# Patient Record
Sex: Male | Born: 1945 | Race: White | Hispanic: No | Smoking: Former smoker
Health system: Southern US, Community
[De-identification: ages and names within clinical notes are randomized; demographics above are authoritative.]

## PROBLEM LIST (undated history)

## (undated) DIAGNOSIS — I509 Heart failure, unspecified: Secondary | ICD-10-CM

## (undated) DIAGNOSIS — Z7901 Long term (current) use of anticoagulants: Secondary | ICD-10-CM

## (undated) DIAGNOSIS — G629 Polyneuropathy, unspecified: Secondary | ICD-10-CM

## (undated) DIAGNOSIS — I10 Essential (primary) hypertension: Secondary | ICD-10-CM

## (undated) DIAGNOSIS — G4730 Sleep apnea, unspecified: Secondary | ICD-10-CM

## (undated) DIAGNOSIS — I714 Abdominal aortic aneurysm, without rupture, unspecified: Secondary | ICD-10-CM

## (undated) DIAGNOSIS — M48061 Spinal stenosis, lumbar region without neurogenic claudication: Secondary | ICD-10-CM

## (undated) DIAGNOSIS — I499 Cardiac arrhythmia, unspecified: Secondary | ICD-10-CM

## (undated) DIAGNOSIS — E119 Type 2 diabetes mellitus without complications: Secondary | ICD-10-CM

## (undated) DIAGNOSIS — F419 Anxiety disorder, unspecified: Secondary | ICD-10-CM

## (undated) DIAGNOSIS — I252 Old myocardial infarction: Secondary | ICD-10-CM

## (undated) DIAGNOSIS — M1990 Unspecified osteoarthritis, unspecified site: Secondary | ICD-10-CM

## (undated) DIAGNOSIS — I2510 Atherosclerotic heart disease of native coronary artery without angina pectoris: Secondary | ICD-10-CM

## (undated) DIAGNOSIS — F32A Depression, unspecified: Secondary | ICD-10-CM

## (undated) DIAGNOSIS — I255 Ischemic cardiomyopathy: Secondary | ICD-10-CM

## (undated) DIAGNOSIS — I4891 Unspecified atrial fibrillation: Secondary | ICD-10-CM

## (undated) DIAGNOSIS — I700 Atherosclerosis of aorta: Secondary | ICD-10-CM

## (undated) DIAGNOSIS — G459 Transient cerebral ischemic attack, unspecified: Secondary | ICD-10-CM

## (undated) HISTORY — DX: Heart failure, unspecified: I50.9

## (undated) HISTORY — PX: CATARACT EXTRACTION, BILATERAL: SHX1313

## (undated) HISTORY — PX: APPENDECTOMY: SHX54

## (undated) HISTORY — DX: Cardiac arrhythmia, unspecified: I49.9

## (undated) HISTORY — PX: COLONOSCOPY: SHX174

## (undated) HISTORY — PX: BREAST SURGERY: SHX581

## (undated) HISTORY — PX: REPLACEMENT TOTAL KNEE BILATERAL: SUR1225

## (undated) HISTORY — PX: CORONARY ANGIOPLASTY WITH STENT PLACEMENT: SHX49

---

## 2005-06-24 ENCOUNTER — Emergency Department: Payer: Self-pay | Admitting: Emergency Medicine

## 2008-06-13 ENCOUNTER — Ambulatory Visit: Payer: Self-pay | Admitting: Internal Medicine

## 2009-06-18 ENCOUNTER — Emergency Department: Payer: Self-pay | Admitting: Emergency Medicine

## 2011-01-12 ENCOUNTER — Emergency Department: Payer: Self-pay | Admitting: Emergency Medicine

## 2011-08-15 DIAGNOSIS — E669 Obesity, unspecified: Secondary | ICD-10-CM | POA: Insufficient documentation

## 2011-09-19 ENCOUNTER — Ambulatory Visit: Payer: Self-pay | Admitting: Family Medicine

## 2012-01-29 DIAGNOSIS — M179 Osteoarthritis of knee, unspecified: Secondary | ICD-10-CM | POA: Insufficient documentation

## 2012-01-29 DIAGNOSIS — M1710 Unilateral primary osteoarthritis, unspecified knee: Secondary | ICD-10-CM | POA: Insufficient documentation

## 2012-10-01 ENCOUNTER — Ambulatory Visit: Payer: Self-pay | Admitting: Family Medicine

## 2012-10-01 DIAGNOSIS — I714 Abdominal aortic aneurysm, without rupture: Secondary | ICD-10-CM | POA: Insufficient documentation

## 2013-10-20 ENCOUNTER — Ambulatory Visit: Payer: Self-pay | Admitting: Family Medicine

## 2013-11-09 ENCOUNTER — Ambulatory Visit: Payer: Self-pay | Admitting: Family Medicine

## 2013-11-09 LAB — CREATININE, SERUM
CREATININE: 1.07 mg/dL (ref 0.60–1.30)
EGFR (African American): >60

## 2013-11-11 DIAGNOSIS — N281 Cyst of kidney, acquired: Secondary | ICD-10-CM | POA: Insufficient documentation

## 2014-12-19 DIAGNOSIS — E119 Type 2 diabetes mellitus without complications: Secondary | ICD-10-CM

## 2016-05-17 DIAGNOSIS — G608 Other hereditary and idiopathic neuropathies: Secondary | ICD-10-CM | POA: Insufficient documentation

## 2016-07-05 DIAGNOSIS — F321 Major depressive disorder, single episode, moderate: Secondary | ICD-10-CM | POA: Insufficient documentation

## 2017-05-21 ENCOUNTER — Other Ambulatory Visit: Payer: Self-pay | Admitting: Family Medicine

## 2017-05-21 DIAGNOSIS — I714 Abdominal aortic aneurysm, without rupture, unspecified: Secondary | ICD-10-CM

## 2017-05-21 DIAGNOSIS — E119 Type 2 diabetes mellitus without complications: Secondary | ICD-10-CM

## 2017-05-28 ENCOUNTER — Encounter (INDEPENDENT_AMBULATORY_CARE_PROVIDER_SITE_OTHER): Payer: Self-pay

## 2017-05-28 ENCOUNTER — Ambulatory Visit
Admission: RE | Admit: 2017-05-28 | Discharge: 2017-05-28 | Disposition: A | Discharge status: Home / Self Care | Payer: Medicare HMO | Source: Ambulatory Visit | Attending: Family Medicine | Admitting: Family Medicine

## 2017-05-28 DIAGNOSIS — I714 Abdominal aortic aneurysm, without rupture, unspecified: Secondary | ICD-10-CM

## 2017-05-28 DIAGNOSIS — E119 Type 2 diabetes mellitus without complications: Secondary | ICD-10-CM

## 2017-10-24 ENCOUNTER — Other Ambulatory Visit: Payer: Self-pay | Admitting: Medical Oncology

## 2017-10-24 DIAGNOSIS — R030 Elevated blood-pressure reading, without diagnosis of hypertension: Secondary | ICD-10-CM

## 2017-10-29 ENCOUNTER — Ambulatory Visit
Admission: RE | Admit: 2017-10-29 | Discharge: 2017-10-29 | Disposition: A | Discharge status: Home / Self Care | Payer: Medicare HMO | Source: Ambulatory Visit | Attending: Medical Oncology | Admitting: Medical Oncology

## 2017-10-29 DIAGNOSIS — N281 Cyst of kidney, acquired: Secondary | ICD-10-CM | POA: Diagnosis not present

## 2017-10-29 DIAGNOSIS — R030 Elevated blood-pressure reading, without diagnosis of hypertension: Secondary | ICD-10-CM | POA: Diagnosis not present

## 2018-01-20 DIAGNOSIS — G4733 Obstructive sleep apnea (adult) (pediatric): Secondary | ICD-10-CM | POA: Insufficient documentation

## 2018-01-23 DIAGNOSIS — B070 Plantar wart: Secondary | ICD-10-CM | POA: Insufficient documentation

## 2018-08-09 ENCOUNTER — Emergency Department: Payer: Medicare HMO

## 2018-08-09 ENCOUNTER — Emergency Department
Admission: EM | Admit: 2018-08-09 | Discharge: 2018-08-09 | Disposition: A | Discharge status: Home / Self Care | Payer: Medicare HMO | Attending: Emergency Medicine | Admitting: Emergency Medicine

## 2018-08-09 ENCOUNTER — Encounter: Payer: Self-pay | Admitting: Emergency Medicine

## 2018-08-09 ENCOUNTER — Other Ambulatory Visit: Payer: Self-pay

## 2018-08-09 DIAGNOSIS — I10 Essential (primary) hypertension: Secondary | ICD-10-CM | POA: Insufficient documentation

## 2018-08-09 DIAGNOSIS — R6884 Jaw pain: Secondary | ICD-10-CM | POA: Diagnosis not present

## 2018-08-09 DIAGNOSIS — I2510 Atherosclerotic heart disease of native coronary artery without angina pectoris: Secondary | ICD-10-CM | POA: Diagnosis not present

## 2018-08-09 DIAGNOSIS — E119 Type 2 diabetes mellitus without complications: Secondary | ICD-10-CM | POA: Insufficient documentation

## 2018-08-09 DIAGNOSIS — I252 Old myocardial infarction: Secondary | ICD-10-CM | POA: Insufficient documentation

## 2018-08-09 DIAGNOSIS — Z955 Presence of coronary angioplasty implant and graft: Secondary | ICD-10-CM | POA: Diagnosis not present

## 2018-08-09 HISTORY — DX: Old myocardial infarction: I25.2

## 2018-08-09 HISTORY — DX: Type 2 diabetes mellitus without complications: E11.9

## 2018-08-09 HISTORY — DX: Essential (primary) hypertension: I10

## 2018-08-09 LAB — BASIC METABOLIC PANEL
Anion gap: 11 (ref 5–15)
BUN: 13 mg/dL (ref 8–23)
CO2: 26 mmol/L (ref 22–32)
Calcium: 9.1 mg/dL (ref 8.9–10.3)
Chloride: 97 mmol/L — ABNORMAL LOW (ref 98–111)
Creatinine, Ser: 0.76 mg/dL (ref 0.61–1.24)
GFR calc Af Amer: >60 mL/min (ref >60)
GFR calc non Af Amer: >60 mL/min (ref >60)
Glucose, Bld: 122 mg/dL — ABNORMAL HIGH (ref 70–99)
Potassium: 4 mmol/L (ref 3.5–5.1)
Sodium: 134 mmol/L — ABNORMAL LOW (ref 135–145)

## 2018-08-09 LAB — CBC
HCT: 47.7 % (ref 39.0–52.0)
Hemoglobin: 15.9 g/dL (ref 13.0–17.0)
MCH: 31.5 pg (ref 26.0–34.0)
MCHC: 33.3 g/dL (ref 30.0–36.0)
MCV: 94.6 fL (ref 80.0–100.0)
Platelets: 244 10*3/uL (ref 150–400)
RBC: 5.04 MIL/uL (ref 4.22–5.81)
RDW: 12.9 % (ref 11.5–15.5)
WBC: 8.6 10*3/uL (ref 4.0–10.5)
nRBC: 0 % (ref 0.0–0.2)

## 2018-08-09 LAB — TROPONIN I: Troponin I: <0.03 ng/mL (ref <0.03)

## 2018-08-09 MED ORDER — IOHEXOL 350 MG/ML SOLN
75.0000 mL | Freq: Once | INTRAVENOUS | Status: AC | PRN
  Administered 2018-08-09: 10:00:00 75 mL via INTRAVENOUS

## 2018-08-09 MED ORDER — SODIUM CHLORIDE 0.9% FLUSH
3.0000 mL | Freq: Once | INTRAVENOUS | Status: AC
  Administered 2018-08-09: 3 mL via INTRAVENOUS

## 2018-08-09 NOTE — Discharge Instructions (Addendum)
Return to the ER for new, worsening, or persistent jaw or neck pain, chest pain, difficulty breathing, weakness or lightheadedness, or any numbness, problems with speech or vision, or strokelike symptoms.  Make an appointment to follow-up with your regular doctor within the next few weeks.

## 2018-08-09 NOTE — ED Provider Notes (Signed)
[facility] Emergency Department Provider Note ____________________________________________   First MD Initiated Contact with Patient 08/09/18 [phone]     (approximate)  I have reviewed the triage vital signs and the nursing notes.   HISTORY  Chief Complaint Jaw Pain    HPI Devin Gowdadward Frank Miotke Jr. is a 73 y.o. male with PMH as noted below including CAD status post stent placement who presents with right-sided neck pain over approximately the last 2 weeks, relatively mild in intensity and intermittent in time course.  He states that today it started radiating to his right jaw so he became concerned about a "blockage.".  He denies chest pain, difficulty breathing, or any weakness or lightheadedness.  He has no headache, and no weakness or numbness.  He has no throbbing sensation or any swelling to the neck.  Past Medical History:  Diagnosis Date  . Diabetes mellitus without complication (HCC)   . Hypertension   . MI, old     There are no active problems to display for this patient.   Past Surgical History:  Procedure Laterality Date  . CORONARY ANGIOPLASTY WITH STENT PLACEMENT      Prior to Admission medications   Not on File    Allergies Patient has no known allergies.  No family history on file.  Social History Social History   Tobacco Use  . Smoking status: Not on file  Substance Use Topics  . Alcohol use: Not on file  . Drug use: Not on file    Review of Systems  Constitutional: No fever. Eyes: No visual changes. ENT: Positive for neck pain. Cardiovascular: Denies chest pain. Respiratory: Denies shortness of breath. Gastrointestinal: No nausea or vomiting. Genitourinary: Negative for flank pain.  Musculoskeletal: Negative for back pain. Skin: Negative for rash. Neurological: Negative for headaches, focal weakness or numbness.   ____________________________________________   PHYSICAL EXAM:  VITAL SIGNS: ED Triage Vitals   Enc Vitals Group     BP 08/09/18 0901 (!) 162/85     Pulse Rate 08/09/18 0901 81     Resp 08/09/18 0901 20     Temp 08/09/18 0901 97.9 F (36.6 C)     Temp Source 08/09/18 0901 Oral     SpO2 08/09/18 0901 100 %     Weight 08/09/18 0858 220 lb (99.8 kg)     Height 08/09/18 0858 6' (1.829 m)     Head Circumference --      Peak Flow --      Pain Score 08/09/18 0858 3     Pain Loc --      Pain Edu? --      Excl. in GC? --     Constitutional: Alert and oriented. Well appearing and in no acute distress. Eyes: Conjunctivae are normal.  Head: Atraumatic. Nose: No congestion/rhinnorhea. Mouth/Throat: Mucous membranes are moist.   Neck: Normal range of motion.  Nontender.  No palpable masses, or any bruit or thrill. Cardiovascular: Normal rate, regular rhythm. Grossly normal heart sounds.  Good peripheral circulation. Respiratory: Normal respiratory effort.  No retractions. Lungs CTAB. Gastrointestinal: No distention.  Musculoskeletal: Extremities warm and well perfused.  Neurologic:  Normal speech and language.  Motor intact in all extremities.  No gross focal neurologic deficits are appreciated.  Skin:  Skin is warm and dry. No rash noted. Psychiatric: Mood and affect are normal. Speech and behavior are normal.  ____________________________________________   LABS (all labs ordered are listed, but only abnormal results are displayed)  Labs Reviewed  BASIC METABOLIC PANEL - Abnormal; Notable for the following components:      Result Value   Sodium 134 (*)    Chloride 97 (*)    Glucose, Bld 122 (*)    All other components within normal limits  CBC  TROPONIN I   ____________________________________________  EKG  ED ECG REPORT I, Dionne Bucy, the attending physician, personally viewed and interpreted this ECG.  Date: 08/09/2018 EKG Time: 0857 Rate: 82 Rhythm: normal sinus rhythm QRS Axis: normal Intervals: normal ST/T Wave abnormalities: normal Narrative  Interpretation: no evidence of acute ischemia  ____________________________________________  RADIOLOGY  CXR: No acute abnormality CT angio neck: Atherosclerotic findings consistent with age, with no acute abnormalities  ____________________________________________   PROCEDURES  Procedure(s) performed: No  Procedures  Critical Care performed: No ____________________________________________   INITIAL IMPRESSION / ASSESSMENT AND PLAN / ED COURSE  Pertinent labs & imaging results that were available during my care of the patient were reviewed by me and considered in my medical decision making (see chart for details).  73 year old male with a history of CAD presents with right-sided neck pain over the last 2 weeks, now radiating to his jaw.  He has no chest pain, or any neurologic symptoms.  There is no history of trauma.  On exam the patient is relatively well-appearing.  His vital signs are normal except for hypertension.  The neck is nontender, and there is no palpable mass or any bruit or thrill.  The remainder of the exam is unremarkable.  EKG is nonischemic.  Overall I suspect most likely benign etiology such as muscular pain or possible neuralgia.  The patient has had shingles in the past although there is no rash present there currently.  However, given his CAD history and elevated risk of ACS, we will obtain labs including troponin to rule out ACS.  He has no strokelike symptoms to suggest acute arterial occlusion, but given his CAD history I will also obtain a CT angio of the neck to rule out vascular etiology.  ----------------------------------------- 12:46 PM on 08/09/2018 -----------------------------------------  Lab work-up including troponin are negative.  The patient continues to remain comfortable.  Chest x-ray and CT angiogram of the neck show no acute abnormality.  The CT just shows atherosclerotic changes consistent with his age and chronic appearing.  At this  time, the patient is stable for discharge home.  I offered to keep him for a second troponin given his CAD history and elevated risk for ACS, but he declines.  He understands that without doing this we cannot fully rule out a heart attack although given the fact that his symptoms have been present for several days, the suspicion for ACS and the likelihood of a positive second troponin is very low.  I counseled him on the results of the work-up.  He agrees to follow-up with his PMD.  Return precautions given, and he expresses understanding. ____________________________________________   FINAL CLINICAL IMPRESSION(S) / ED DIAGNOSES  Final diagnoses:  Jaw pain      NEW MEDICATIONS STARTED DURING THIS VISIT:  New Prescriptions   No medications on file     Note:  This document was prepared using Dragon voice recognition software and may include unintentional dictation errors.    Dionne Bucy, MD 08/09/18 1247

## 2018-08-09 NOTE — ED Triage Notes (Signed)
R jaw and neck pain x 1 week. No injury.

## 2018-08-09 NOTE — ED Notes (Signed)
Pt resting quietly with eyes closed at this time, respirations equal and unlabored.

## 2018-08-09 NOTE — ED Notes (Signed)
Patient transported to X-ray 

## 2019-03-04 ENCOUNTER — Other Ambulatory Visit: Payer: Self-pay | Admitting: Family Medicine

## 2019-03-04 ENCOUNTER — Ambulatory Visit
Admission: RE | Admit: 2019-03-04 | Discharge: 2019-03-04 | Disposition: A | Discharge status: Home / Self Care | Payer: Medicare HMO | Source: Ambulatory Visit | Attending: Family Medicine | Admitting: Family Medicine

## 2019-03-04 ENCOUNTER — Other Ambulatory Visit: Payer: Self-pay

## 2019-03-04 DIAGNOSIS — M7989 Other specified soft tissue disorders: Secondary | ICD-10-CM | POA: Insufficient documentation

## 2019-03-28 ENCOUNTER — Ambulatory Visit: Payer: Medicare Other | Attending: Internal Medicine

## 2019-03-28 DIAGNOSIS — Z23 Encounter for immunization: Secondary | ICD-10-CM | POA: Insufficient documentation

## 2019-03-28 NOTE — Progress Notes (Signed)
   [id number]  Name:  Devin [name] [facility].    MRN: [id number] DOB: [birth year]  03/28/2019  Devin [name] was observed post Covid-19 immunization for 15 minutes without incidence. He was provided with Vaccine Information Sheet and instruction to access the V-Safe system.   Devin [name] was instructed to call 911 with any severe reactions post vaccine: Marland Kitchen Difficulty breathing  . Swelling of your face and throat  . A fast heartbeat  . A bad rash all over your body  . Dizziness and weakness    Immunizations Administered    Name Date Dose VIS Date Route   Pfizer COVID-19 Vaccine 03/28/2019 12:44 PM 0.3 mL 02/20/2019 Intramuscular   Manufacturer: [employer], [employer]   Lot: [id number]   NDC: [address]-4

## 2019-04-18 ENCOUNTER — Ambulatory Visit: Payer: Medicare HMO | Attending: Internal Medicine

## 2019-04-18 DIAGNOSIS — Z23 Encounter for immunization: Secondary | ICD-10-CM | POA: Insufficient documentation

## 2019-04-18 NOTE — Progress Notes (Signed)
   [id number]  Name:  Andres Bantz [facility].    MRN: [id number] DOB: 07/19/1945  04/18/2019  Mr. Ehlert was observed post Covid-19 immunization for 15 minutes without incidence. He was provided with Vaccine Information Sheet and instruction to access the V-Safe system.   Mr. Mclinden was instructed to call 911 with any severe reactions post vaccine: Marland Kitchen Difficulty breathing  . Swelling of your face and throat  . A fast heartbeat  . A bad rash all over your body  . Dizziness and weakness    Immunizations Administered    Name Date Dose VIS Date Route   Pfizer COVID-19 Vaccine 04/18/2019  1:27 PM 0.3 mL 02/20/2019 Intramuscular   Manufacturer: [employer], [employer]   Lot: [id number]   NDC: [address]-2

## 2019-05-11 DIAGNOSIS — Q2112 Patent foramen ovale: Secondary | ICD-10-CM

## 2019-05-11 DIAGNOSIS — Q211 Atrial septal defect: Secondary | ICD-10-CM

## 2019-05-11 HISTORY — PX: CORONARY ARTERY BYPASS GRAFT: SHX141

## 2019-05-11 HISTORY — DX: Atrial septal defect: Q21.1

## 2019-05-11 HISTORY — DX: Patent foramen ovale: Q21.12

## 2019-05-16 ENCOUNTER — Other Ambulatory Visit: Payer: Self-pay

## 2019-05-16 ENCOUNTER — Emergency Department: Payer: Medicare HMO

## 2019-05-16 ENCOUNTER — Observation Stay
Admission: EM | Admit: 2019-05-16 | Discharge: 2019-05-16 | Disposition: A | Discharge status: Home / Self Care | Payer: Medicare HMO | Attending: Internal Medicine | Admitting: Internal Medicine

## 2019-05-16 DIAGNOSIS — Z7984 Long term (current) use of oral hypoglycemic drugs: Secondary | ICD-10-CM | POA: Insufficient documentation

## 2019-05-16 DIAGNOSIS — I10 Essential (primary) hypertension: Secondary | ICD-10-CM | POA: Diagnosis not present

## 2019-05-16 DIAGNOSIS — K219 Gastro-esophageal reflux disease without esophagitis: Secondary | ICD-10-CM | POA: Diagnosis not present

## 2019-05-16 DIAGNOSIS — Z79899 Other long term (current) drug therapy: Secondary | ICD-10-CM | POA: Insufficient documentation

## 2019-05-16 DIAGNOSIS — I25110 Atherosclerotic heart disease of native coronary artery with unstable angina pectoris: Principal | ICD-10-CM | POA: Insufficient documentation

## 2019-05-16 DIAGNOSIS — Z955 Presence of coronary angioplasty implant and graft: Secondary | ICD-10-CM | POA: Insufficient documentation

## 2019-05-16 DIAGNOSIS — I252 Old myocardial infarction: Secondary | ICD-10-CM | POA: Insufficient documentation

## 2019-05-16 DIAGNOSIS — I209 Angina pectoris, unspecified: Secondary | ICD-10-CM | POA: Diagnosis not present

## 2019-05-16 DIAGNOSIS — Z20822 Contact with and (suspected) exposure to covid-19: Secondary | ICD-10-CM | POA: Insufficient documentation

## 2019-05-16 DIAGNOSIS — Z87891 Personal history of nicotine dependence: Secondary | ICD-10-CM | POA: Insufficient documentation

## 2019-05-16 DIAGNOSIS — E119 Type 2 diabetes mellitus without complications: Secondary | ICD-10-CM | POA: Diagnosis not present

## 2019-05-16 DIAGNOSIS — I25111 Atherosclerotic heart disease of native coronary artery with angina pectoris with documented spasm: Secondary | ICD-10-CM

## 2019-05-16 DIAGNOSIS — F32A Depression, unspecified: Secondary | ICD-10-CM

## 2019-05-16 DIAGNOSIS — I2510 Atherosclerotic heart disease of native coronary artery without angina pectoris: Secondary | ICD-10-CM | POA: Diagnosis present

## 2019-05-16 DIAGNOSIS — F329 Major depressive disorder, single episode, unspecified: Secondary | ICD-10-CM | POA: Diagnosis not present

## 2019-05-16 DIAGNOSIS — I200 Unstable angina: Secondary | ICD-10-CM

## 2019-05-16 DIAGNOSIS — R079 Chest pain, unspecified: Secondary | ICD-10-CM | POA: Diagnosis present

## 2019-05-16 LAB — TROPONIN I (HIGH SENSITIVITY)
Troponin I (High Sensitivity): 8 ng/L (ref <18)
Troponin I (High Sensitivity): 18 ng/L — ABNORMAL HIGH (ref <18)
Troponin I (High Sensitivity): 23 ng/L — ABNORMAL HIGH (ref <18)

## 2019-05-16 LAB — BASIC METABOLIC PANEL
Anion gap: 12 (ref 5–15)
Anion gap: 15 (ref 5–15)
BUN: 14 mg/dL (ref 8–23)
BUN: 13 mg/dL (ref 8–23)
CO2: 25 mmol/L (ref 22–32)
CO2: 24 mmol/L (ref 22–32)
Calcium: 9 mg/dL (ref 8.9–10.3)
Calcium: 9.3 mg/dL (ref 8.9–10.3)
Chloride: 98 mmol/L (ref 98–111)
Chloride: 93 mmol/L — ABNORMAL LOW (ref 98–111)
Creatinine, Ser: 0.7 mg/dL (ref 0.61–1.24)
Creatinine, Ser: 0.7 mg/dL (ref 0.61–1.24)
GFR calc Af Amer: >60 mL/min (ref >60)
GFR calc Af Amer: >60 mL/min (ref >60)
GFR calc non Af Amer: >60 mL/min (ref >60)
GFR calc non Af Amer: >60 mL/min (ref >60)
Glucose, Bld: 145 mg/dL — ABNORMAL HIGH (ref 70–99)
Glucose, Bld: 149 mg/dL — ABNORMAL HIGH (ref 70–99)
Potassium: 3.7 mmol/L (ref 3.5–5.1)
Potassium: 4.1 mmol/L (ref 3.5–5.1)
Sodium: 135 mmol/L (ref 135–145)
Sodium: 132 mmol/L — ABNORMAL LOW (ref 135–145)

## 2019-05-16 LAB — CBC
HCT: 46.7 % (ref 39.0–52.0)
Hemoglobin: 15.8 g/dL (ref 13.0–17.0)
MCH: 31.5 pg (ref 26.0–34.0)
MCHC: 33.8 g/dL (ref 30.0–36.0)
MCV: 93.2 fL (ref 80.0–100.0)
Platelets: 273 10*3/uL (ref 150–400)
RBC: 5.01 MIL/uL (ref 4.22–5.81)
RDW: 12.9 % (ref 11.5–15.5)
WBC: 9.5 10*3/uL (ref 4.0–10.5)
nRBC: 0 % (ref 0.0–0.2)

## 2019-05-16 LAB — RESPIRATORY PANEL BY RT PCR (FLU A&B, COVID)
Influenza A by PCR: NEGATIVE
Influenza B by PCR: NEGATIVE
SARS Coronavirus 2 by RT PCR: NEGATIVE

## 2019-05-16 LAB — HEMOGLOBIN A1C
Hgb A1c MFr Bld: 5.8 % — ABNORMAL HIGH (ref 4.8–5.6)
Mean Plasma Glucose: 119.76 mg/dL

## 2019-05-16 LAB — GLUCOSE, CAPILLARY
Glucose-Capillary: 151 mg/dL — ABNORMAL HIGH (ref 70–99)
Glucose-Capillary: 138 mg/dL — ABNORMAL HIGH (ref 70–99)

## 2019-05-16 MED ORDER — MORPHINE SULFATE (PF) 2 MG/ML IV SOLN
2.0000 mg | Freq: Once | INTRAVENOUS | Status: AC
  Administered 2019-05-16: 2 mg via INTRAVENOUS
  Filled 2019-05-16: qty 1

## 2019-05-16 MED ORDER — ENOXAPARIN SODIUM 40 MG/0.4ML ~~LOC~~ SOLN: 40.0000 mg | SUBCUTANEOUS | Status: DC

## 2019-05-16 MED ORDER — ASPIRIN 81 MG PO CHEW: 81.0000 mg | CHEWABLE_TABLET | Freq: Every day | ORAL | Status: DC

## 2019-05-16 MED ORDER — ATORVASTATIN CALCIUM 20 MG PO TABS: 40.0000 mg | ORAL_TABLET | Freq: Every day | ORAL | Status: DC

## 2019-05-16 MED ORDER — ISOSORBIDE MONONITRATE ER 30 MG PO TB24: 30.0000 mg | ORAL_TABLET | Freq: Every day | ORAL | 1 refills | Status: DC

## 2019-05-16 MED ORDER — EZETIMIBE 10 MG PO TABS
10.0000 mg | ORAL_TABLET | Freq: Every day | ORAL | Status: DC
  Administered 2019-05-16: 10 mg via ORAL
  Filled 2019-05-16: qty 1

## 2019-05-16 MED ORDER — INSULIN ASPART 100 UNIT/ML ~~LOC~~ SOLN: 4.0000 [IU] | Freq: Three times a day (TID) | SUBCUTANEOUS | Status: DC

## 2019-05-16 MED ORDER — ONDANSETRON HCL 4 MG/2ML IJ SOLN: 4.0000 mg | Freq: Four times a day (QID) | INTRAMUSCULAR | Status: DC | PRN

## 2019-05-16 MED ORDER — ASPIRIN EC 81 MG PO TBEC
81.0000 mg | DELAYED_RELEASE_TABLET | Freq: Once | ORAL | Status: AC
  Administered 2019-05-16: 81 mg via ORAL
  Filled 2019-05-16: qty 1

## 2019-05-16 MED ORDER — METFORMIN HCL 500 MG PO TABS: 500.0000 mg | ORAL_TABLET | Freq: Two times a day (BID) | ORAL | Status: DC

## 2019-05-16 MED ORDER — ASCORBIC ACID 500 MG PO TABS
500.0000 mg | ORAL_TABLET | Freq: Every day | ORAL | Status: DC
  Administered 2019-05-16: 500 mg via ORAL
  Filled 2019-05-16: qty 1

## 2019-05-16 MED ORDER — ADULT MULTIVITAMIN W/MINERALS CH
1.0000 | ORAL_TABLET | Freq: Every day | ORAL | Status: DC
  Administered 2019-05-16: 1 via ORAL
  Filled 2019-05-16: qty 1

## 2019-05-16 MED ORDER — ONDANSETRON HCL 4 MG/2ML IJ SOLN
4.0000 mg | Freq: Once | INTRAMUSCULAR | Status: AC
  Administered 2019-05-16: 4 mg via INTRAVENOUS
  Filled 2019-05-16: qty 2

## 2019-05-16 MED ORDER — PAROXETINE HCL 30 MG PO TABS
30.0000 mg | ORAL_TABLET | Freq: Every day | ORAL | Status: DC
  Administered 2019-05-16: 30 mg via ORAL
  Filled 2019-05-16: qty 1

## 2019-05-16 MED ORDER — CLOPIDOGREL BISULFATE 75 MG PO TABS
300.0000 mg | ORAL_TABLET | Freq: Every day | ORAL | Status: DC
  Administered 2019-05-16: 300 mg via ORAL
  Filled 2019-05-16: qty 4

## 2019-05-16 MED ORDER — NITROGLYCERIN 2 % TD OINT
0.5000 [in_us] | TOPICAL_OINTMENT | Freq: Once | TRANSDERMAL | Status: AC
  Administered 2019-05-16: 0.5 [in_us] via TOPICAL

## 2019-05-16 MED ORDER — GABAPENTIN 300 MG PO CAPS
300.0000 mg | ORAL_CAPSULE | Freq: Two times a day (BID) | ORAL | Status: DC
  Administered 2019-05-16: 300 mg via ORAL
  Filled 2019-05-16: qty 1

## 2019-05-16 MED ORDER — ATORVASTATIN CALCIUM 80 MG PO TABS: 80.0000 mg | ORAL_TABLET | Freq: Every day | ORAL | 11 refills | Status: DC

## 2019-05-16 MED ORDER — ISOSORBIDE MONONITRATE ER 60 MG PO TB24
30.0000 mg | ORAL_TABLET | Freq: Every day | ORAL | Status: DC
  Administered 2019-05-16: 30 mg via ORAL
  Filled 2019-05-16: qty 1

## 2019-05-16 MED ORDER — ACETAMINOPHEN 500 MG PO TABS: 1000.0000 mg | ORAL_TABLET | Freq: Every day | ORAL | Status: DC

## 2019-05-16 MED ORDER — CLOPIDOGREL BISULFATE 75 MG PO TABS: 75.0000 mg | ORAL_TABLET | Freq: Every day | ORAL | 11 refills | Status: DC

## 2019-05-16 MED ORDER — PANTOPRAZOLE SODIUM 40 MG PO TBEC
40.0000 mg | DELAYED_RELEASE_TABLET | Freq: Every day | ORAL | Status: DC
  Administered 2019-05-16: 40 mg via ORAL
  Filled 2019-05-16: qty 1

## 2019-05-16 MED ORDER — CARVEDILOL 6.25 MG PO TABS
12.5000 mg | ORAL_TABLET | Freq: Two times a day (BID) | ORAL | Status: DC
  Administered 2019-05-16: 12.5 mg via ORAL
  Filled 2019-05-16: qty 2

## 2019-05-16 MED ORDER — ACETAMINOPHEN 325 MG PO TABS: 650.0000 mg | ORAL_TABLET | ORAL | Status: DC | PRN

## 2019-05-16 MED ORDER — AMLODIPINE BESYLATE 5 MG PO TABS
10.0000 mg | ORAL_TABLET | Freq: Every day | ORAL | Status: DC
  Administered 2019-05-16: 10 mg via ORAL
  Filled 2019-05-16: qty 2

## 2019-05-16 MED ORDER — CARVEDILOL 25 MG PO TABS: 25.0000 mg | ORAL_TABLET | Freq: Two times a day (BID) | ORAL | 11 refills | Status: DC

## 2019-05-16 MED ORDER — CLOPIDOGREL 5 MG/ML PEDIATRIC ORAL SUSPENSION: 300.0000 mg | Freq: Every day | ORAL | Status: DC

## 2019-05-16 MED ORDER — BENAZEPRIL HCL 20 MG PO TABS
20.0000 mg | ORAL_TABLET | Freq: Every day | ORAL | Status: DC
  Administered 2019-05-16: 20 mg via ORAL
  Filled 2019-05-16: qty 1

## 2019-05-16 MED ORDER — INSULIN ASPART 100 UNIT/ML ~~LOC~~ SOLN
0.0000 [IU] | Freq: Three times a day (TID) | SUBCUTANEOUS | Status: DC
  Administered 2019-05-16 (×2): 2 [IU] via SUBCUTANEOUS
  Filled 2019-05-16 (×2): qty 1

## 2019-05-16 MED ORDER — VITAMIN D 25 MCG (1000 UNIT) PO TABS
5000.0000 [IU] | ORAL_TABLET | Freq: Every day | ORAL | Status: DC
  Administered 2019-05-16: 5000 [IU] via ORAL
  Filled 2019-05-16 (×2): qty 5

## 2019-05-16 NOTE — ED Triage Notes (Addendum)
Patient c/o medial chest tightness. Denies accompanying symptoms. Patient took 2 nitroglycerins PTA - reports that his pain decreased from a 10/10 to 3/10 after medication.

## 2019-05-16 NOTE — H&P (Signed)
History and Physical    Brewster Wolters [employer]. KPT:[id number] DOB: 1945/03/22 DOA: 05/16/2019  PCP: [name], MD   Patient coming from: Home  I have personally briefly reviewed patient's old medical records in [facility] Link  Chief Complaint: Chest pain  HPI: Devin [name]. is a 74 y.o. male with medical history significant for CAD, diabetes mellitus and hypertension who presents to emergency room with complaints of midsternal chest tightness that woke him up out of his sleep at about 4am. He described the chest tightness as pressure like and increasing in intensity so he took one sublingual NTG with resolution of his pain. He tried to go back to sleep but developed chest tightness again for which he took another SL NTG. The tightness persisted despite medication and so he drove to the ER.  Chest tightness was not associated with diaphoresis, palpitations, nausea or vomiting.  Denies having any fever, chills, cough, shortness of breath, abdominal pain or changes in his bowel habits. Denies having any urinary symptoms. Per patient this chest tightness is similar to when he had his heart attack  ED Course: Patient with a history of coronary artery disease who presented to the emergency room for evaluation of chest tightness that improved following administration of nitroglycerin.  Patient received aspirin, NTP and morphine in the ER and is now chest pain free.  Review of Systems: As per HPI otherwise 10 point review of systems negative.    Past Medical History:  Diagnosis Date  . Diabetes mellitus without complication (HCC)   . Hypertension   . MI, old     Past Surgical History:  Procedure Laterality Date  . CORONARY ANGIOPLASTY WITH STENT PLACEMENT       reports that he has quit smoking. He has never used smokeless tobacco. He reports current alcohol use. No history on file for drug.  No Known Allergies  No family history on file.   Prior to Admission  medications   Not on File    Physical Exam: Vitals:   05/16/19 0609 05/16/19 0612 05/16/19 0700 05/16/19 0730  BP: (!) 118/99  (!) 148/89 132/90  Pulse: 96  95 95  Resp: 16  18 19   Temp:  97.9 F (36.6 C)    TempSrc:  Oral    SpO2:  98% 95% 94%  Weight:      Height:   5\' 10"  (1.778 m)      Vitals:   05/16/19 0609 05/16/19 0612 05/16/19 0700 05/16/19 0730  BP: (!) 118/99  (!) 148/89 132/90  Pulse: 96  95 95  Resp: 16  18 19   Temp:  97.9 F (36.6 C)    TempSrc:  Oral    SpO2:  98% 95% 94%  Weight:      Height:   5\' 10"  (1.778 m)     Constitutional: NAD, alert and oriented to person Eyes: PERRL, lids and conjunctivae normal ENMT: Mucous membranes are moist.  Neck: normal, supple, no masses, no thyromegaly Respiratory: clear to auscultation bilaterally, no wheezing, no crackles. Normal respiratory effort. No accessory muscle use.  Cardiovascular: Regular rate and rhythm, no murmurs / rubs / gallops. trace extremity edema. 2+ pedal pulses. No carotid bruits.  Abdomen: no tenderness, no masses palpated. No hepatosplenomegaly. Bowel sounds positive.  Musculoskeletal: no clubbing / cyanosis. No joint deformity upper and lower extremities.  Skin: no rashes, lesions, ulcers.  Neurologic: No gross focal neurologic deficit. Psychiatric: Normal mood and affect.   Labs on Admission:  I have personally reviewed following labs and imaging studies  CBC: Recent Labs  Lab 05/16/19 0610  WBC 9.5  HGB 15.8  HCT 46.7  MCV 93.2  PLT 273   Basic Metabolic Panel: Recent Labs  Lab 05/16/19 0610 05/16/19 0749  NA 135 132*  K 3.7 4.1  CL 98 93*  CO2 25 24  GLUCOSE 145* 149*  BUN 14 13  CREATININE 0.70 0.70  CALCIUM 9.0 9.3   GFR: Estimated Creatinine Clearance: 94.6 mL/min (by C-G formula based on SCr of 0.7 mg/dL). Liver Function Tests: No results for input(s): AST, ALT, ALKPHOS, BILITOT, PROT, ALBUMIN in the last 168 hours. No results for input(s): LIPASE, AMYLASE in  the last 168 hours. No results for input(s): AMMONIA in the last 168 hours. Coagulation Profile: No results for input(s): INR, PROTIME in the last 168 hours. Cardiac Enzymes: No results for input(s): CKTOTAL, CKMB, CKMBINDEX, TROPONINI in the last 168 hours. BNP (last 3 results) No results for input(s): PROBNP in the last 8760 hours. HbA1C: No results for input(s): HGBA1C in the last 72 hours. CBG: Recent Labs  Lab 05/16/19 0820  GLUCAP 151*   Lipid Profile: No results for input(s): CHOL, HDL, LDLCALC, TRIG, CHOLHDL, LDLDIRECT in the last 72 hours. Thyroid Function Tests: No results for input(s): TSH, T4TOTAL, FREET4, T3FREE, THYROIDAB in the last 72 hours. Anemia Panel: No results for input(s): VITAMINB12, FOLATE, FERRITIN, TIBC, IRON, RETICCTPCT in the last 72 hours. Urine analysis: No results found for: COLORURINE, APPEARANCEUR, LABSPEC, PHURINE, GLUCOSEU, HGBUR, BILIRUBINUR, KETONESUR, PROTEINUR, UROBILINOGEN, NITRITE, LEUKOCYTESUR  Radiological Exams on Admission: DG Chest 2 View  Result Date: 05/16/2019 CLINICAL DATA:  Chest pain EXAM: CHEST - 2 VIEW COMPARISON:  08/09/2018 FINDINGS: The heart size and mediastinal contours are within normal limits. No new consolidation or edema. Calcified granuloma of the mid left lung. Probable mild scarring at the left lung base. No acute osseous abnormality. IMPRESSION: No acute process in the chest. Electronically Signed   By: Guadlupe Spanish M.D.   On: 05/16/2019 06:31    EKG: Independently reviewed.  Sinus rhythm ST changes in the inferior leads  Assessment/Plan Principal Problem:   Ischemic chest pain (HCC) Active Problems:   CAD (coronary artery disease)   Type 2 diabetes mellitus without complication (HCC)   Essential hypertension   Depression   GERD (gastroesophageal reflux disease)       Chest pain Patient presents for evaluation of chest pain that improved following administration of nitroglycerin and Morphine He has  a significant history of coronary artery disease Will obtain serial cardiac enzymes Obtain 2D echocardiogram to rule out regional wall motion abnormality Continue aspirin, statins and beta blockers Will consult cardiology for further recommendation   History of coronary artery disease Patient has a known history of coronary artery disease with a history of LAD MI in 1994 for which he received TPA with residual 25% stenosis.  Circumflex PCI in 1998 with 60% LAD at that time.  Patient had an interval nuclear test 02/24/2018 at [facility] which demonstrated an EF of 47%, no focal wall motion abnormalities and no ischemia.  Will defer further work-up to cardiology   Diabetes mellitus Keep patient NPO for now Hold Metformin Sliding scale coverage with insulin   Depression Continue Paxil   GERD Continue Omeprazole  DVT prophylaxis: Lovenox Code Status: Full Family Communication: Plan of care was discussed with patient and he verbalizes understanding and agrees with the plan Disposition Plan: Back to previous home environment Consults  called: Cardiology    Collier Bullock MD [facility]     05/16/2019, 8:51 AM

## 2019-05-16 NOTE — ED Provider Notes (Signed)
[facility] Emergency Department Provider Note   ____________________________________________   First MD Initiated Contact with Patient 05/16/19 [phone]     (approximate)  I have reviewed the triage vital signs and the nursing notes.   HISTORY  Chief Complaint Chest Pain    HPI Devin [name]. is a 74 y.o. male who presents to the ED from home with a chief complaint of chest tightness.  Patient was awakened by medial chest tightness which resolved after taking 2 sublingual nitroglycerin.  Symptoms not associated with diaphoresis, shortness of breath, nausea/vomiting or palpitations.  Denies recent fever, cough, abdominal pain.  History of CAD status post stents greater than 10 years ago.       Past Medical History:  Diagnosis Date  . Diabetes mellitus without complication (HCC)   . Hypertension   . MI, old     There are no problems to display for this patient.   Past Surgical History:  Procedure Laterality Date  . CORONARY ANGIOPLASTY WITH STENT PLACEMENT      Prior to Admission medications   Not on File    Allergies Patient has no known allergies.  No family history on file.  Social History Social History   Tobacco Use  . Smoking status: Former Games developer  . Smokeless tobacco: Never Used  Substance Use Topics  . Alcohol use: Yes  . Drug use: Not on file    Review of Systems  Constitutional: No fever/chills Eyes: No visual changes. ENT: No sore throat. Cardiovascular: Positive for chest pain. Respiratory: Denies shortness of breath. Gastrointestinal: No abdominal pain.  No nausea, no vomiting.  No diarrhea.  No constipation. Genitourinary: Negative for dysuria. Musculoskeletal: Negative for back pain. Skin: Negative for rash. Neurological: Negative for headaches, focal weakness or numbness.   ____________________________________________   PHYSICAL EXAM:  VITAL SIGNS: ED Triage Vitals [05/16/19 0600]  Enc Vitals  Group     BP      Pulse      Resp      Temp      Temp src      SpO2      Weight 220 lb 7.4 oz (100 kg)     Height      Head Circumference      Peak Flow      Pain Score 2     Pain Loc      Pain Edu?      Excl. in GC?     Constitutional: Alert and oriented. Well appearing and in no acute distress. Eyes: Conjunctivae are normal. PERRL. EOMI. Head: Atraumatic. Nose: No congestion/rhinnorhea. Mouth/Throat: Mucous membranes are moist.  Oropharynx non-erythematous. Neck: No stridor.   Cardiovascular: Normal rate, regular rhythm. Grossly normal heart sounds.  Good peripheral circulation. Respiratory: Normal respiratory effort.  No retractions. Lungs CTAB. Gastrointestinal: Soft and nontender. No distention. No abdominal bruits. No CVA tenderness. Musculoskeletal: No lower extremity tenderness nor edema.  No joint effusions. Neurologic:  Normal speech and language. No gross focal neurologic deficits are appreciated. No gait instability. Skin:  Skin is warm, dry and intact. No rash noted. Psychiatric: Mood and affect are normal. Speech and behavior are normal.  ____________________________________________   LABS (all labs ordered are listed, but only abnormal results are displayed)  Labs Reviewed  BASIC METABOLIC PANEL - Abnormal; Notable for the following components:      Result Value   Glucose, Bld 145 (*)    All other components within normal limits  RESPIRATORY PANEL  BY RT PCR (FLU A&B, COVID)  CBC  TROPONIN I (HIGH SENSITIVITY)   ____________________________________________  EKG  ED ECG REPORT I, Bethani Brugger J, the attending physician, personally viewed and interpreted this ECG.   Date: 05/16/2019  EKG Time: 0606  Rate: 92  Rhythm: normal EKG, normal sinus rhythm  Axis: Normal  Intervals:none  ST&T Change: Nonspecific  ____________________________________________  RADIOLOGY  ED MD interpretation: No acute cardiopulmonary process  Official radiology  report(s): DG Chest 2 View  Result Date: 05/16/2019 CLINICAL DATA:  Chest pain EXAM: CHEST - 2 VIEW COMPARISON:  08/09/2018 FINDINGS: The heart size and mediastinal contours are within normal limits. No new consolidation or edema. Calcified granuloma of the mid left lung. Probable mild scarring at the left lung base. No acute osseous abnormality. IMPRESSION: No acute process in the chest. Electronically Signed   By: Macy Mis M.D.   On: 05/16/2019 06:31    ____________________________________________   PROCEDURES  Procedure(s) performed (including Critical Care):  Procedures   ____________________________________________   INITIAL IMPRESSION / ASSESSMENT AND PLAN / ED COURSE  As part of my medical decision making, I reviewed the following data within the [place] notes reviewed and incorporated, Labs reviewed, EKG interpreted, Old chart reviewed, Radiograph reviewed and Notes from prior ED visits     Devin [name]. was evaluated in Emergency Department on 05/16/2019 for the symptoms described in the history of present illness. He was evaluated in the context of the global COVID-19 pandemic, which necessitated consideration that the patient might be at risk for infection with the SARS-CoV-2 virus that causes COVID-19. Institutional protocols and algorithms that pertain to the evaluation of patients at risk for COVID-19 are in a state of rapid change based on information released by regulatory bodies including the CDC and federal and state organizations. These policies and algorithms were followed during the patient's care in the ED.    74 year old male who presents with chest pain. Differential diagnosis includes, but is not limited to, ACS, aortic dissection, pulmonary embolism, cardiac tamponade, pneumothorax, pneumonia, pericarditis, myocarditis, GI-related causes including esophagitis/gastritis, and musculoskeletal chest wall pain.    Patient is  currently chest pain-free after 2 sublingual nitroglycerin which she took prior to arrival.  He has also taken a baby aspirin since midnight.  Will administer 3 additional baby aspirin's, apply nitroglycerin paste.  Obtain cardiac work-up, chest x-ray, COVID-19 swab.  Anticipate hospitalization for unstable angina.      ____________________________________________   FINAL CLINICAL IMPRESSION(S) / ED DIAGNOSES  Final diagnoses:  Chest pain, unspecified type  Unstable angina [facility])     ED Discharge Orders    None       Note:  This document was prepared using Dragon voice recognition software and may include unintentional dictation errors.   Paulette Blanch, MD 05/16/19 [phone]

## 2019-05-16 NOTE — Consult Note (Signed)
Devin [name]. is a 73 y.o. male  [id number]  Primary Cardiologist: Adrian Blackwater Reason for Consultation: Chest pain  HPI: This is a 74 year old white male with a past medical history of PCI with last seen by a cardiologist in December at [facility] presented to the hospital with chest pain described as pressure type associated with shortness of breath.  He took initially first nitroglycerin and chest pain subsided but came back and had to take a second nitroglycerin and then came to the emergency room where he has been chest pain-free since morning.  He denies any pressure in his chest or shortness of breath and wants to go home versus having work-up in the office versus hospital.   Review of Systems: No orthopnea PND or leg swelling   Past Medical History:  Diagnosis Date  . Diabetes mellitus without complication (HCC)   . Hypertension   . MI, old     (Not in a hospital admission)    . acetaminophen  1,000 mg Oral QHS  . amLODipine  10 mg Oral Daily  . ascorbic acid  500 mg Oral Daily  . [START ON 05/17/2019] aspirin  81 mg Oral Daily  . atorvastatin  40 mg Oral QHS  . benazepril  20 mg Oral Daily  . carvedilol  12.5 mg Oral BID  . cholecalciferol  5,000 Units Oral Daily  . enoxaparin (LOVENOX) injection  40 mg Subcutaneous Q24H  . ezetimibe  10 mg Oral Daily  . gabapentin  300 mg Oral BID  . insulin aspart  0-15 Units Subcutaneous TID WC  . insulin aspart  4 Units Subcutaneous TID WC  . multivitamin with minerals  1 tablet Oral Daily  . pantoprazole  40 mg Oral Daily  . PARoxetine  30 mg Oral Daily    Infusions:   No Known Allergies  Social History   Socioeconomic History  . Marital status: Widowed    Spouse name: Not on file  . Number of children: Not on file  . Years of education: Not on file  . Highest education level: Not on file  Occupational History  . Not on file  Tobacco Use  . Smoking status: Former Games developer  . Smokeless tobacco: Never Used   Substance and Sexual Activity  . Alcohol use: Yes  . Drug use: Not on file  . Sexual activity: Not on file  Other Topics Concern  . Not on file  Social History Narrative  . Not on file   Social Determinants of Health   Financial Resource Strain:   . Difficulty of Paying Living Expenses: Not on file  Food Insecurity:   . Worried About Programme researcher, broadcasting/film/video in the Last Year: Not on file  . Ran Out of Food in the Last Year: Not on file  Transportation Needs:   . Lack of Transportation (Medical): Not on file  . Lack of Transportation (Non-Medical): Not on file  Physical Activity:   . Days of Exercise per Week: Not on file  . Minutes of Exercise per Session: Not on file  Stress:   . Feeling of Stress : Not on file  Social Connections:   . Frequency of Communication with Friends and Family: Not on file  . Frequency of Social Gatherings with Friends and Family: Not on file  . Attends Religious Services: Not on file  . Active Member of Clubs or Organizations: Not on file  . Attends Banker Meetings: Not on file  .  Marital Status: Not on file  Intimate Partner Violence:   . Fear of Current or Ex-Partner: Not on file  . Emotionally Abused: Not on file  . Physically Abused: Not on file  . Sexually Abused: Not on file    No family history on file.  PHYSICAL EXAM: Vitals:   05/16/19 0730 05/16/19 1144  BP: 132/90 125/77  Pulse: 95 86  Resp: 19 19  Temp:    SpO2: 94% 95%    No intake or output data in the 24 hours ending 05/16/19 1242  General:  Well appearing. No respiratory difficulty HEENT: normal Neck: supple. no JVD. Carotids 2+ bilat; no bruits. No lymphadenopathy or thryomegaly appreciated. Cor: PMI nondisplaced. Regular rate & rhythm. No rubs, gallops or murmurs. Lungs: clear Abdomen: soft, nontender, nondistended. No hepatosplenomegaly. No bruits or masses. Good bowel sounds. Extremities: no cyanosis, clubbing, rash, edema Neuro: alert & oriented x  3, cranial nerves grossly intact. moves all 4 extremities w/o difficulty. Affect pleasant.  ECG: Normal sinus rhythm with nonspecific ST-T changes no evidence of ST elevation  Results for orders placed or performed during the hospital encounter of 05/16/19 (from the past 24 hour(s))  Basic metabolic panel     Status: Abnormal   Collection Time: 05/16/19  6:10 AM  Result Value Ref Range   Sodium 135 135 - 145 mmol/L   Potassium 3.7 3.5 - 5.1 mmol/L   Chloride 98 98 - 111 mmol/L   CO2 25 22 - 32 mmol/L   Glucose, Bld 145 (H) 70 - 99 mg/dL   BUN 14 8 - 23 mg/dL   Creatinine, Ser 0.70 0.61 - 1.24 mg/dL   Calcium 9.0 8.9 - 10.3 mg/dL   GFR calc non Af Amer >60 >60 mL/min   GFR calc Af Amer >60 >60 mL/min   Anion gap 12 5 - 15  CBC     Status: None   Collection Time: 05/16/19  6:10 AM  Result Value Ref Range   WBC 9.5 4.0 - 10.5 K/uL   RBC 5.01 4.22 - 5.81 MIL/uL   Hemoglobin 15.8 13.0 - 17.0 g/dL   HCT 46.7 39.0 - 52.0 %   MCV 93.2 80.0 - 100.0 fL   MCH 31.5 26.0 - 34.0 pg   MCHC 33.8 30.0 - 36.0 g/dL   RDW 12.9 11.5 - 15.5 %   Platelets 273 150 - 400 K/uL   nRBC 0.0 0.0 - 0.2 %  Troponin I (High Sensitivity)     Status: None   Collection Time: 05/16/19  6:10 AM  Result Value Ref Range   Troponin I (High Sensitivity) 8 <18 ng/L  Respiratory Panel by RT PCR (Flu A&B, Covid) - Nasopharyngeal Swab     Status: None   Collection Time: 05/16/19  6:46 AM   Specimen: Nasopharyngeal Swab  Result Value Ref Range   SARS Coronavirus 2 by RT PCR NEGATIVE NEGATIVE   Influenza A by PCR NEGATIVE NEGATIVE   Influenza B by PCR NEGATIVE NEGATIVE  Troponin I (High Sensitivity)     Status: Abnormal   Collection Time: 05/16/19  7:49 AM  Result Value Ref Range   Troponin I (High Sensitivity) 18 (H) <18 ng/L  Basic metabolic panel     Status: Abnormal   Collection Time: 05/16/19  7:49 AM  Result Value Ref Range   Sodium 132 (L) 135 - 145 mmol/L   Potassium 4.1 3.5 - 5.1 mmol/L   Chloride 93  (L) 98 - 111 mmol/L  CO2 24 22 - 32 mmol/L   Glucose, Bld 149 (H) 70 - 99 mg/dL   BUN 13 8 - 23 mg/dL   Creatinine, Ser 6.37 0.61 - 1.24 mg/dL   Calcium 9.3 8.9 - 85.8 mg/dL   GFR calc non Af Amer >60 >60 mL/min   GFR calc Af Amer >60 >60 mL/min   Anion gap 15 5 - 15  Glucose, capillary     Status: Abnormal   Collection Time: 05/16/19  8:20 AM  Result Value Ref Range   Glucose-Capillary 151 (H) 70 - 99 mg/dL  Glucose, capillary     Status: Abnormal   Collection Time: 05/16/19 11:43 AM  Result Value Ref Range   Glucose-Capillary 138 (H) 70 - 99 mg/dL  Troponin I (High Sensitivity)     Status: Abnormal   Collection Time: 05/16/19 11:58 AM  Result Value Ref Range   Troponin I (High Sensitivity) 23 (H) <18 ng/L   DG Chest 2 View  Result Date: 05/16/2019 CLINICAL DATA:  Chest pain EXAM: CHEST - 2 VIEW COMPARISON:  08/09/2018 FINDINGS: The heart size and mediastinal contours are within normal limits. No new consolidation or edema. Calcified granuloma of the mid left lung. Probable mild scarring at the left lung base. No acute osseous abnormality. IMPRESSION: No acute process in the chest. Electronically Signed   By: Guadlupe Spanish M.D.   On: 05/16/2019 06:31     ASSESSMENT AND PLAN: Patient has anginal type of chest pain with history of coronary artery disease and PCI but has ruled out for myocardial infarction.  Patient is chest pain-free at this time and advise adding isosorbide 30 mg p.o. once a day in addition to ACE inhibitor's beta-blockers and statins advise adding Plavix with loading dose of 300 mg p.o. now and then 75 mg once a day.  Also advise adding metoprolol succinate 25 mg once a day.  Also advise increasing atorvastatin to 80 mg once a day.  Patient can be given isosorbide and metoprolol as well as 300 of Plavix and then can be discharged with follow-up in the office Monday at 9 AM.  Will do echocardiogram as well as stress test on Monday upon his arrival in the office.  Patient  was explained the benefits of risks and he has opted to have outpatient work-up.  Carlinda Ohlson A

## 2019-05-16 NOTE — ED Notes (Signed)
PTreports chest pain that woke him from sleep, states that he took 1 nitroglycerin tablet and pain went away. States later pain came back and another nitro was self administered and pain subsided. Pt then came to ED for eval due to cardiac hx. PT denies pain at this time and show no signs of distress. Currently being transported to xray

## 2019-05-16 NOTE — ED Notes (Signed)
PT reports pain in the chest has arose  that is similar to his pain from home. States it starts in center of chest and radiates to right side. MD notified

## 2019-05-16 NOTE — ED Notes (Signed)
Pt unhooked to go to restroom independently.

## 2019-05-16 NOTE — ED Notes (Signed)
Admitting at bedside 

## 2019-05-16 NOTE — Discharge Summary (Signed)
Physician Discharge Summary  Patient ID: Devin [name] [facility]. MRN: [id number] DOB/AGE: [birth year] 74 y.o.  Admit date: 05/16/2019 Discharge date: 05/16/2019  Admission Diagnoses:  Discharge Diagnoses:  Principal Problem:   Ischemic chest pain (HCC) Active Problems:   CAD (coronary artery disease)   Type 2 diabetes mellitus without complication (HCC)   Essential hypertension   Depression   GERD (gastroesophageal reflux disease)   Discharged Condition: stable  Hospital Course: Devin [name]. is a 74 y.o. male with medical history significant for CAD, diabetes mellitus and hypertension who presented to emergency room with complaints of midsternal chest tightness that woke him up out of his sleep at about 4am on the morning of his admission. He described the chest tightness as pressure like and increasing in intensity so he took one sublingual NTG with resolution of his pain. He tried to go back to sleep but developed chest tightness again for which he took another SL NTG. The tightness persisted despite medication and so he drove to the ER.  Chest tightness was not associated with diaphoresis, palpitations, nausea or vomiting.  Denied having any fever, chills, cough, shortness of breath, abdominal pain or changes in his bowel habits. Denied having any urinary symptoms. Per patient this chest tightness is similar to when he had his heart attack.  Patient was seen in consultation by cardiology who made adjustments to patient's medications started patient on nitrates, Plavix and recommended to increase dose of carvedilol.  Patient to follow-up with cardiology as an outpatient for further treatment  Consults: cardiology  Significant Diagnostic Studies: labs:   Treatments: cardiac meds: carvedilol  Discharge Exam: Blood pressure 125/77, pulse 86, temperature 97.9 F (36.6 C), temperature source Oral, resp. rate 19, height 5\' 10"  (1.778 m), weight 100 kg, SpO2 95 %. Head:  Normocephalic, without obvious abnormality, atraumatic Constitutional: NAD, alert and oriented to person Eyes: PERRL, lids and conjunctivae normal ENMT: Mucous membranes are moist.  Neck: normal, supple, no masses, no thyromegaly Respiratory: clear to auscultation bilaterally, no wheezing, no crackles. Normal respiratory effort. No accessory muscle use.  Cardiovascular: Regular rate and rhythm, no murmurs / rubs / gallops. trace extremity edema. 2+ pedal pulses. No carotid bruits.  Abdomen: no tenderness, no masses palpated. No hepatosplenomegaly. Bowel sounds positive.  Musculoskeletal: no clubbing / cyanosis. No joint deformity upper and lower extremities.  Skin: no rashes, lesions, ulcers.  Neurologic: No gross focal neurologic deficit. Psychiatric: Normal mood and affect.  Disposition: Discharge disposition: 01-Home or Self Care       Discharge Instructions    Diet - low sodium heart healthy   Complete by: As directed    Diet Carb Modified   Complete by: As directed    Discharge instructions   Complete by: As directed    Follow up with cardiologist, Dr on 05/18/19 Return to ER for worsening chest pain   Increase activity slowly   Complete by: As directed      Allergies as of 05/16/2019   No Known Allergies     Medication List    STOP taking these medications   amLODipine 10 MG tablet Commonly known as: NORVASC   hydrochlorothiazide 25 MG tablet Commonly known as: HYDRODIURIL     TAKE these medications   acetaminophen 500 MG tablet Commonly known as: TYLENOL Take 1,000 mg by mouth at bedtime.   ascorbic acid 500 MG tablet Commonly known as: VITAMIN C Take 500 mg by mouth daily.   aspirin 81 MG chewable  tablet Chew 81 mg by mouth daily.   atorvastatin 80 MG tablet Commonly known as: Lipitor Take 1 tablet (80 mg total) by mouth daily. What changed:   medication strength  how much to take  when to take this   benazepril 20 MG tablet Commonly  known as: LOTENSIN Take 20 mg by mouth daily.   carvedilol 25 MG tablet Commonly known as: Coreg Take 1 tablet (25 mg total) by mouth 2 (two) times daily. What changed:   medication strength  how much to take   Cholecalciferol 125 MCG (5000 UT) Tabs Take 5,000 Units by mouth daily.   clopidogrel 75 MG tablet Commonly known as: Plavix Take 1 tablet (75 mg total) by mouth daily.   esomeprazole 20 MG capsule Commonly known as: NEXIUM Take 20 mg by mouth daily.   ezetimibe 10 MG tablet Commonly known as: ZETIA Take 10 mg by mouth daily.   gabapentin 300 MG capsule Commonly known as: NEURONTIN Take 300 mg by mouth 2 (two) times daily.   isosorbide mononitrate 30 MG 24 hr tablet Commonly known as: IMDUR Take 1 tablet (30 mg total) by mouth daily.   metFORMIN 500 MG tablet Commonly known as: GLUCOPHAGE Take 500 mg by mouth 2 (two) times daily.   Multi-Vitamin tablet Take 1 tablet by mouth daily.   PARoxetine 30 MG tablet Commonly known as: PAXIL Take 30 mg by mouth daily.        Signed: Eshaan Titzer 05/16/2019, 1:29 PM

## 2019-05-20 ENCOUNTER — Ambulatory Visit: Payer: Self-pay | Admitting: Cardiovascular Disease

## 2019-05-20 ENCOUNTER — Other Ambulatory Visit: Payer: Self-pay

## 2019-05-20 ENCOUNTER — Emergency Department: Payer: Medicare HMO

## 2019-05-20 ENCOUNTER — Inpatient Hospital Stay
Admission: EM | Admit: 2019-05-20 | Discharge: 2019-05-21 | DRG: 287 | Disposition: A | Discharge status: Other Acute Inpatient Hospital | Payer: Medicare HMO | Attending: Internal Medicine | Admitting: Internal Medicine

## 2019-05-20 ENCOUNTER — Encounter: Payer: Self-pay | Admitting: Emergency Medicine

## 2019-05-20 DIAGNOSIS — I25110 Atherosclerotic heart disease of native coronary artery with unstable angina pectoris: Secondary | ICD-10-CM | POA: Diagnosis not present

## 2019-05-20 DIAGNOSIS — I209 Angina pectoris, unspecified: Secondary | ICD-10-CM | POA: Diagnosis present

## 2019-05-20 DIAGNOSIS — I200 Unstable angina: Secondary | ICD-10-CM

## 2019-05-20 DIAGNOSIS — R0789 Other chest pain: Secondary | ICD-10-CM | POA: Diagnosis not present

## 2019-05-20 DIAGNOSIS — Z7984 Long term (current) use of oral hypoglycemic drugs: Secondary | ICD-10-CM

## 2019-05-20 DIAGNOSIS — Z7982 Long term (current) use of aspirin: Secondary | ICD-10-CM

## 2019-05-20 DIAGNOSIS — F32A Depression, unspecified: Secondary | ICD-10-CM | POA: Diagnosis present

## 2019-05-20 DIAGNOSIS — F329 Major depressive disorder, single episode, unspecified: Secondary | ICD-10-CM | POA: Diagnosis present

## 2019-05-20 DIAGNOSIS — I2510 Atherosclerotic heart disease of native coronary artery without angina pectoris: Secondary | ICD-10-CM | POA: Diagnosis present

## 2019-05-20 DIAGNOSIS — Z79899 Other long term (current) drug therapy: Secondary | ICD-10-CM

## 2019-05-20 DIAGNOSIS — Z955 Presence of coronary angioplasty implant and graft: Secondary | ICD-10-CM

## 2019-05-20 DIAGNOSIS — K219 Gastro-esophageal reflux disease without esophagitis: Secondary | ICD-10-CM | POA: Diagnosis present

## 2019-05-20 DIAGNOSIS — E119 Type 2 diabetes mellitus without complications: Secondary | ICD-10-CM | POA: Diagnosis present

## 2019-05-20 DIAGNOSIS — R079 Chest pain, unspecified: Secondary | ICD-10-CM

## 2019-05-20 DIAGNOSIS — Z87891 Personal history of nicotine dependence: Secondary | ICD-10-CM

## 2019-05-20 DIAGNOSIS — Z7902 Long term (current) use of antithrombotics/antiplatelets: Secondary | ICD-10-CM

## 2019-05-20 DIAGNOSIS — I252 Old myocardial infarction: Secondary | ICD-10-CM

## 2019-05-20 DIAGNOSIS — I10 Essential (primary) hypertension: Secondary | ICD-10-CM

## 2019-05-20 LAB — BASIC METABOLIC PANEL
Anion gap: 6 (ref 5–15)
Anion gap: 8 (ref 5–15)
BUN: 14 mg/dL (ref 8–23)
BUN: 13 mg/dL (ref 8–23)
CO2: 27 mmol/L (ref 22–32)
CO2: 27 mmol/L (ref 22–32)
Calcium: 8.7 mg/dL — ABNORMAL LOW (ref 8.9–10.3)
Calcium: 9.2 mg/dL (ref 8.9–10.3)
Chloride: 102 mmol/L (ref 98–111)
Chloride: 101 mmol/L (ref 98–111)
Creatinine, Ser: 0.81 mg/dL (ref 0.61–1.24)
Creatinine, Ser: 0.91 mg/dL (ref 0.61–1.24)
GFR calc Af Amer: >60 mL/min (ref >60)
GFR calc Af Amer: >60 mL/min (ref >60)
GFR calc non Af Amer: >60 mL/min (ref >60)
GFR calc non Af Amer: >60 mL/min (ref >60)
Glucose, Bld: 164 mg/dL — ABNORMAL HIGH (ref 70–99)
Glucose, Bld: 116 mg/dL — ABNORMAL HIGH (ref 70–99)
Potassium: 4.2 mmol/L (ref 3.5–5.1)
Potassium: 4.3 mmol/L (ref 3.5–5.1)
Sodium: 135 mmol/L (ref 135–145)
Sodium: 136 mmol/L (ref 135–145)

## 2019-05-20 LAB — PROTIME-INR
INR: 1 (ref 0.8–1.2)
Prothrombin Time: 13.3 seconds (ref 11.4–15.2)

## 2019-05-20 LAB — CBC
HCT: 42 % (ref 39.0–52.0)
HCT: 44.5 % (ref 39.0–52.0)
Hemoglobin: 14.4 g/dL (ref 13.0–17.0)
Hemoglobin: 14.9 g/dL (ref 13.0–17.0)
MCH: 31.9 pg (ref 26.0–34.0)
MCH: 32 pg (ref 26.0–34.0)
MCHC: 34.3 g/dL (ref 30.0–36.0)
MCHC: 33.5 g/dL (ref 30.0–36.0)
MCV: 93.1 fL (ref 80.0–100.0)
MCV: 95.5 fL (ref 80.0–100.0)
Platelets: 240 10*3/uL (ref 150–400)
Platelets: 264 10*3/uL (ref 150–400)
RBC: 4.51 MIL/uL (ref 4.22–5.81)
RBC: 4.66 MIL/uL (ref 4.22–5.81)
RDW: 12.9 % (ref 11.5–15.5)
RDW: 13.2 % (ref 11.5–15.5)
WBC: 9.7 10*3/uL (ref 4.0–10.5)
WBC: 9.9 10*3/uL (ref 4.0–10.5)
nRBC: 0 % (ref 0.0–0.2)
nRBC: 0 % (ref 0.0–0.2)

## 2019-05-20 LAB — COMPREHENSIVE METABOLIC PANEL
ALT: 30 U/L (ref 0–44)
AST: 25 U/L (ref 15–41)
Albumin: 3.9 g/dL (ref 3.5–5.0)
Alkaline Phosphatase: 40 U/L (ref 38–126)
Anion gap: 7 (ref 5–15)
BUN: 13 mg/dL (ref 8–23)
CO2: 25 mmol/L (ref 22–32)
Calcium: 8.9 mg/dL (ref 8.9–10.3)
Chloride: 102 mmol/L (ref 98–111)
Creatinine, Ser: 0.77 mg/dL (ref 0.61–1.24)
GFR calc Af Amer: >60 mL/min (ref >60)
GFR calc non Af Amer: >60 mL/min (ref >60)
Glucose, Bld: 129 mg/dL — ABNORMAL HIGH (ref 70–99)
Potassium: 4.5 mmol/L (ref 3.5–5.1)
Sodium: 134 mmol/L — ABNORMAL LOW (ref 135–145)
Total Bilirubin: 0.5 mg/dL (ref 0.3–1.2)
Total Protein: 6.7 g/dL (ref 6.5–8.1)

## 2019-05-20 LAB — GLUCOSE, CAPILLARY
Glucose-Capillary: 107 mg/dL — ABNORMAL HIGH (ref 70–99)
Glucose-Capillary: 132 mg/dL — ABNORMAL HIGH (ref 70–99)
Glucose-Capillary: 108 mg/dL — ABNORMAL HIGH (ref 70–99)
Glucose-Capillary: 119 mg/dL — ABNORMAL HIGH (ref 70–99)

## 2019-05-20 LAB — TROPONIN I (HIGH SENSITIVITY)
Troponin I (High Sensitivity): 12 ng/L (ref <18)
Troponin I (High Sensitivity): 14 ng/L (ref <18)

## 2019-05-20 LAB — MAGNESIUM: Magnesium: 2.1 mg/dL (ref 1.7–2.4)

## 2019-05-20 MED ORDER — ASPIRIN EC 81 MG PO TBEC: 81.0000 mg | DELAYED_RELEASE_TABLET | Freq: Every day | ORAL | Status: DC

## 2019-05-20 MED ORDER — SODIUM BICARBONATE 8.4 % IV SOLN
INTRAVENOUS | Status: DC
  Filled 2019-05-20: qty 1000

## 2019-05-20 MED ORDER — BENAZEPRIL HCL 20 MG PO TABS
20.0000 mg | ORAL_TABLET | Freq: Every day | ORAL | Status: DC
  Administered 2019-05-20 – 2019-05-21 (×2): 20 mg via ORAL
  Filled 2019-05-20 (×3): qty 1

## 2019-05-20 MED ORDER — SODIUM CHLORIDE 0.9% FLUSH
3.0000 mL | Freq: Once | INTRAVENOUS | Status: AC
  Administered 2019-05-20: 3 mL via INTRAVENOUS

## 2019-05-20 MED ORDER — HEPARIN SODIUM (PORCINE) 5000 UNIT/ML IJ SOLN
5000.0000 [IU] | Freq: Three times a day (TID) | INTRAMUSCULAR | Status: DC
  Administered 2019-05-20: 5000 [IU] via SUBCUTANEOUS
  Filled 2019-05-20: qty 1

## 2019-05-20 MED ORDER — EZETIMIBE 10 MG PO TABS
10.0000 mg | ORAL_TABLET | Freq: Every day | ORAL | Status: DC
  Administered 2019-05-20 – 2019-05-21 (×2): 10 mg via ORAL
  Filled 2019-05-20 (×2): qty 1

## 2019-05-20 MED ORDER — PAROXETINE HCL 30 MG PO TABS
30.0000 mg | ORAL_TABLET | Freq: Every day | ORAL | Status: DC
  Administered 2019-05-20 – 2019-05-21 (×2): 30 mg via ORAL
  Filled 2019-05-20 (×3): qty 1

## 2019-05-20 MED ORDER — ACETAMINOPHEN 325 MG PO TABS: 650.0000 mg | ORAL_TABLET | ORAL | Status: DC | PRN

## 2019-05-20 MED ORDER — ONDANSETRON HCL 4 MG/2ML IJ SOLN: 4.0000 mg | Freq: Four times a day (QID) | INTRAMUSCULAR | Status: DC | PRN

## 2019-05-20 MED ORDER — SODIUM BICARBONATE-DEXTROSE 150-5 MEQ/L-% IV SOLN
150.0000 meq | INTRAVENOUS | Status: AC
  Administered 2019-05-21: 150 meq via INTRAVENOUS
  Filled 2019-05-20: qty 1000

## 2019-05-20 MED ORDER — SODIUM CHLORIDE 0.9% FLUSH
3.0000 mL | Freq: Two times a day (BID) | INTRAVENOUS | Status: DC
  Filled 2019-05-20: qty 3

## 2019-05-20 MED ORDER — ASPIRIN 81 MG PO CHEW
81.0000 mg | CHEWABLE_TABLET | Freq: Every day | ORAL | Status: DC
  Administered 2019-05-20: 81 mg via ORAL
  Filled 2019-05-20: qty 1

## 2019-05-20 MED ORDER — CARVEDILOL 25 MG PO TABS
25.0000 mg | ORAL_TABLET | Freq: Two times a day (BID) | ORAL | Status: DC
  Administered 2019-05-20 – 2019-05-21 (×4): 25 mg via ORAL
  Filled 2019-05-20 (×4): qty 1

## 2019-05-20 MED ORDER — SODIUM CHLORIDE 0.9% FLUSH: 3.0000 mL | INTRAVENOUS | Status: DC | PRN

## 2019-05-20 MED ORDER — ASPIRIN 81 MG PO CHEW
81.0000 mg | CHEWABLE_TABLET | ORAL | Status: AC
  Administered 2019-05-21: 81 mg via ORAL
  Filled 2019-05-20: qty 1

## 2019-05-20 MED ORDER — CLOPIDOGREL BISULFATE 75 MG PO TABS
75.0000 mg | ORAL_TABLET | Freq: Every day | ORAL | Status: DC
  Administered 2019-05-20: 75 mg via ORAL
  Filled 2019-05-20: qty 1

## 2019-05-20 MED ORDER — PANTOPRAZOLE SODIUM 40 MG PO TBEC
40.0000 mg | DELAYED_RELEASE_TABLET | Freq: Every day | ORAL | Status: DC
  Administered 2019-05-20 – 2019-05-21 (×2): 40 mg via ORAL
  Filled 2019-05-20 (×2): qty 1

## 2019-05-20 MED ORDER — SODIUM CHLORIDE 0.9 % IV SOLN: INTRAVENOUS | Status: DC

## 2019-05-20 MED ORDER — ISOSORBIDE MONONITRATE ER 30 MG PO TB24
30.0000 mg | ORAL_TABLET | Freq: Every day | ORAL | Status: DC
  Administered 2019-05-20: 30 mg via ORAL
  Filled 2019-05-20: qty 1

## 2019-05-20 MED ORDER — ENOXAPARIN SODIUM 40 MG/0.4ML ~~LOC~~ SOLN
40.0000 mg | SUBCUTANEOUS | Status: DC
  Administered 2019-05-20: 40 mg via SUBCUTANEOUS
  Filled 2019-05-20: qty 0.4

## 2019-05-20 MED ORDER — SODIUM BICARBONATE BOLUS VIA INFUSION
INTRAVENOUS | Status: DC
  Filled 2019-05-20: qty 1

## 2019-05-20 MED ORDER — INSULIN ASPART 100 UNIT/ML ~~LOC~~ SOLN
0.0000 [IU] | Freq: Three times a day (TID) | SUBCUTANEOUS | Status: DC
  Administered 2019-05-21: 3 [IU] via SUBCUTANEOUS
  Filled 2019-05-20: qty 1

## 2019-05-20 MED ORDER — NITROGLYCERIN 0.4 MG SL SUBL: 0.4000 mg | SUBLINGUAL_TABLET | SUBLINGUAL | Status: DC | PRN

## 2019-05-20 MED ORDER — ISOSORBIDE MONONITRATE ER 60 MG PO TB24
60.0000 mg | ORAL_TABLET | Freq: Every day | ORAL | Status: DC
  Administered 2019-05-21: 60 mg via ORAL
  Filled 2019-05-20: qty 1

## 2019-05-20 MED ORDER — SODIUM BICARBONATE 8.4 % IV SOLN
INTRAVENOUS | Status: AC
  Filled 2019-05-20: qty 500

## 2019-05-20 MED ORDER — DIPHENHYDRAMINE HCL 25 MG PO CAPS
ORAL_CAPSULE | ORAL | Status: AC
  Administered 2019-05-20: 25 mg
  Filled 2019-05-20: qty 1

## 2019-05-20 MED ORDER — DIPHENHYDRAMINE HCL 25 MG PO CAPS: 25.0000 mg | ORAL_CAPSULE | Freq: Every evening | ORAL | Status: DC | PRN

## 2019-05-20 MED ORDER — ATORVASTATIN CALCIUM 80 MG PO TABS
80.0000 mg | ORAL_TABLET | Freq: Every day | ORAL | Status: DC
  Administered 2019-05-20 – 2019-05-21 (×3): 80 mg via ORAL
  Filled 2019-05-20 (×3): qty 1

## 2019-05-20 MED ORDER — SODIUM CHLORIDE 0.9 % IV SOLN: 250.0000 mL | INTRAVENOUS | Status: DC | PRN

## 2019-05-20 NOTE — Progress Notes (Signed)
Verbal order received from cardiology for CMP now, order placed in computer

## 2019-05-20 NOTE — ED Notes (Signed)
Pt uprite on stretcher in exam room with no distress noted; assisted into hosp gown & on card monitor for further eval; pt denies any c/o at present; st has been having mid upper CP x wk, nonradiating with no accomp symptoms; tonight pain radiated into both arms and was finally relieved after taking 2 NTG around midnight

## 2019-05-20 NOTE — Consult Note (Signed)
Sodium bicarb for Contrast-Induced Nephropathy   (CIN) prevention per pharmacy consult - pharmacist to hold diuretics the day of the procedure. Patient is scheduled to have a cardiac catheterization tomorrow. There are no diuretics ordered at this time. If there are any ordered, please hold in the morning. Per protocol, sodium bicarb ordered based on weight for cardiac catheterization.    Thank you for allowing pharmacy to be a part of this patient's care.  Cephus Shelling, PharmD Clinical Pharmacist

## 2019-05-20 NOTE — Progress Notes (Signed)
  PROGRESS NOTE    Devin [name].  FXT:[id number] DOB: Aug 15, 1945 DOA: 05/20/2019  PCP: [name], MD    LOS - 0     Patient admitted early this AM for evaluation of chest pain.  When seen this AM, patient reported resolution of his chest pain.  He stated Dr. Milta Deiters assistant had been in to see him, and would go to office to review his recent stress test results to determine next steps.   He asks to ambulate freely around the unit.  On exam, patient standing up at window in the room, lungs clear, heart sounds RRR without appreciable murmur, no lower extremity edema.  I have reviewed the full H&P by Dr. Para March in detail, and I agree with the assessment and plan as outlined therein.  In addition:  --Per cardiology, patient's recent stress test on 3/9 did show EF of 48% with 1 mm ST depression during peak exercise in leads I and II, mild apical hypokinesis, large severe reversible basal anterior and basal anteroseptal, mid anterior, mid anteroseptal, apical anterior, apical septal, apical wall defects. --Cardiac cath planned for tomorrow --Imdur increased to 60 mg --Continue Benzapril, Coreg, Plavix, Lipitor, Zetia   Pennie Banter, DO [facility]   If 7PM-7AM, please contact night-coverage [email or web address] 05/20/2019, 6:21 PM

## 2019-05-20 NOTE — Consult Note (Addendum)
Devin [name]. is a 74 y.o. male  [id number]  Primary Cardiologist: Neoma Laming Reason for Consultation: Chest Pain  HPI: This is a 74 year old white male with a past medical history of PCI last seen in my office 3/8 presenting to the hospital with chest pain described as pressure type associated with shortness of breath and radiating down both arms.  He took initially first nitroglycerin and chest pain subsided but came back and had to take a second nitroglycerin and then came to the emergency room where he has been without chest pain except for 5 minutes overnight.  He denies any pressure in his chest or shortness of breath currently.   Review of Systems: Denies current chest pain, shortness of breath, orthopnea, or PND.   Past Medical History:  Diagnosis Date  . Diabetes mellitus without complication ([place])   . Hypertension   . MI, old     Medications Prior to Admission  Medication Sig Dispense Refill  . acetaminophen (TYLENOL) 500 MG tablet Take 1,000 mg by mouth at bedtime.    Marland Kitchen ascorbic acid (VITAMIN C) 500 MG tablet Take 500 mg by mouth daily.    Marland Kitchen aspirin 81 MG chewable tablet Chew 81 mg by mouth daily.    Marland Kitchen atorvastatin (LIPITOR) 80 MG tablet Take 1 tablet (80 mg total) by mouth daily. 30 tablet 11  . benazepril (LOTENSIN) 20 MG tablet Take 20 mg by mouth daily.    . carvedilol (COREG) 25 MG tablet Take 1 tablet (25 mg total) by mouth 2 (two) times daily. 60 tablet 11  . Cholecalciferol 125 MCG (5000 UT) TABS Take 5,000 Units by mouth daily.    . clopidogrel (PLAVIX) 75 MG tablet Take 1 tablet (75 mg total) by mouth daily. 30 tablet 11  . esomeprazole (NEXIUM) 20 MG capsule Take 20 mg by mouth daily.    Marland Kitchen ezetimibe (ZETIA) 10 MG tablet Take 10 mg by mouth daily.    Marland Kitchen gabapentin (NEURONTIN) 300 MG capsule Take 300 mg by mouth 2 (two) times daily.    . isosorbide mononitrate (IMDUR) 30 MG 24 hr tablet Take 1 tablet (30 mg total) by mouth daily. 30 tablet 1  .  metFORMIN (GLUCOPHAGE) 500 MG tablet Take 500 mg by mouth 2 (two) times daily.    . Multiple Vitamin (MULTI-VITAMIN) tablet Take 1 tablet by mouth daily.    Marland Kitchen PARoxetine (PAXIL) 30 MG tablet Take 30 mg by mouth daily.       Marland Kitchen aspirin  81 mg Oral Daily  . atorvastatin  80 mg Oral Daily  . benazepril  20 mg Oral Daily  . carvedilol  25 mg Oral BID  . clopidogrel  75 mg Oral Daily  . ezetimibe  10 mg Oral Daily  . heparin  5,000 Units Subcutaneous Q8H  . insulin aspart  0-15 Units Subcutaneous TID WC  . isosorbide mononitrate  30 mg Oral Daily  . pantoprazole  40 mg Oral Daily  . PARoxetine  30 mg Oral Daily    Infusions:   Allergies  Allergen Reactions  . 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    Social History   Socioeconomic History  . Marital status: Widowed    Spouse name: Not on file  . Number of children: Not on file  . Years of education: Not on file  . Highest education level: Not on file  Occupational History  . Not on file  Tobacco Use  . Smoking status: Former  Smoker  . Smokeless tobacco: Never Used  Substance and Sexual Activity  . Alcohol use: Yes  . Drug use: Never  . Sexual activity: Not on file  Other Topics Concern  . Not on file  Social History Narrative  . Not on file   Social Determinants of Health   Financial Resource Strain:   . Difficulty of Paying Living Expenses: Not on file  Food Insecurity:   . Worried About Programme researcher, broadcasting/film/video in the Last Year: Not on file  . Ran Out of Food in the Last Year: Not on file  Transportation Needs:   . Lack of Transportation (Medical): Not on file  . Lack of Transportation (Non-Medical): Not on file  Physical Activity:   . Days of Exercise per Week: Not on file  . Minutes of Exercise per Session: Not on file  Stress:   . Feeling of Stress : Not on file  Social Connections:   . Frequency of Communication with Friends and Family: Not on file  . Frequency of Social Gatherings with Friends and  Family: Not on file  . Attends Religious Services: Not on file  . Active Member of Clubs or Organizations: Not on file  . Attends Banker Meetings: Not on file  . Marital Status: Not on file  Intimate Partner Violence:   . Fear of Current or Ex-Partner: Not on file  . Emotionally Abused: Not on file  . Physically Abused: Not on file  . Sexually Abused: Not on file    No family history on file.  PHYSICAL EXAM: Vitals:   05/20/19 0429 05/20/19 0838  BP: 126/77 111/73  Pulse: 80 73  Resp:  19  Temp: 97.8 F (36.6 C) (!) 97.4 F (36.3 C)  SpO2: 98% 96%    No intake or output data in the 24 hours ending 05/20/19 1209  General:  Well appearing. No respiratory difficulty HEENT: normal Neck: supple. no JVD. Carotids 2+ bilat; no bruits. No lymphadenopathy or thryomegaly appreciated. Cor: PMI nondisplaced. Regular rate & rhythm. No rubs, gallops or murmurs. Lungs: clear Abdomen: soft, nontender, nondistended. No hepatosplenomegaly. No bruits or masses. Good bowel sounds. Extremities: no cyanosis, clubbing, rash, edema Neuro: alert & oriented x 3, cranial nerves grossly intact. moves all 4 extremities w/o difficulty. Affect pleasant.  ECG: NSR. 76/bpm  Results for orders placed or performed during the hospital encounter of 05/20/19 (from the past 24 hour(s))  Basic metabolic panel     Status: Abnormal   Collection Time: 05/20/19  1:55 AM  Result Value Ref Range   Sodium 135 135 - 145 mmol/L   Potassium 4.2 3.5 - 5.1 mmol/L   Chloride 102 98 - 111 mmol/L   CO2 27 22 - 32 mmol/L   Glucose, Bld 164 (H) 70 - 99 mg/dL   BUN 14 8 - 23 mg/dL   Creatinine, Ser 2.75 0.61 - 1.24 mg/dL   Calcium 8.7 (L) 8.9 - 10.3 mg/dL   GFR calc non Af Amer >60 >60 mL/min   GFR calc Af Amer >60 >60 mL/min   Anion gap 6 5 - 15  CBC     Status: None   Collection Time: 05/20/19  1:55 AM  Result Value Ref Range   WBC 9.7 4.0 - 10.5 K/uL   RBC 4.51 4.22 - 5.81 MIL/uL   Hemoglobin 14.4  13.0 - 17.0 g/dL   HCT 17.0 01.7 - 49.4 %   MCV 93.1 80.0 - 100.0 fL  MCH 31.9 26.0 - 34.0 pg   MCHC 34.3 30.0 - 36.0 g/dL   RDW 94.4 96.7 - 59.1 %   Platelets 240 150 - 400 K/uL   nRBC 0.0 0.0 - 0.2 %  Troponin I (High Sensitivity)     Status: None   Collection Time: 05/20/19  1:55 AM  Result Value Ref Range   Troponin I (High Sensitivity) 12 <18 ng/L  Troponin I (High Sensitivity)     Status: None   Collection Time: 05/20/19  4:08 AM  Result Value Ref Range   Troponin I (High Sensitivity) 14 <18 ng/L  Glucose, capillary     Status: Abnormal   Collection Time: 05/20/19  8:36 AM  Result Value Ref Range   Glucose-Capillary 107 (H) 70 - 99 mg/dL   DG Chest Port 1 View  Result Date: 05/20/2019 CLINICAL DATA:  Chest pain. EXAM: PORTABLE CHEST 1 VIEW COMPARISON:  May 16, 2019 FINDINGS: A stimulator pack is noted projecting over the right chest wall. This is stable in positioning. There is an additional metallic radiopaque foreign body inferiorly on the right chest wall which is stable from prior study. The heart size is stable. Aortic calcifications are noted. There is no pneumothorax. No significant pleural effusion. No focal infiltrate. IMPRESSION: No active disease. Electronically Signed   By: Katherine Mantle M.D.   On: 05/20/2019 02:15     ASSESSMENT AND PLAN: Patient presenting with chest pain with 2nd hospital visit over 1 week. EKG and troponins are unremarkable and therefore doubtful of NSTEMI or ACS. Patient seen as an outpatient 3/8. At this time, echo showed EF 64%, mild LVH with grade 1 diastolic dysfunction, moderate mitral and pulmonic regurgitation. Stress test on 3/9 showed EF 48% with 14mm ST depression during peak exercise in leads I and II. Mild apical hypokinesis. Large severe reversible basal anterior, basal anteroseptal, mid anterior, mid anteroseptal, apical anterior, apical septal, and apical wall defects. Ischemia in the LAD territory with moderate LV dysfunction  and moderate risk DTMS. With these results, plan on cardiac cath tomorrow morning to look for patency of previous LAD stentsince patient too unstable to wait for outpatient CCTA. Plan to increase isosorbide mononitrate to 60mg . Please continue benzapril, carvedilol, plavix, atorvastatin, and zetia. Will continue to follow.  NP-C

## 2019-05-20 NOTE — ED Triage Notes (Signed)
Pt presents to ED with mid chest pain since around 1800 tonight. Improved after approx 10 min but got worse again at bedtime. Pt states he has taken 2 nitro and pain went from a 5 /10 to 0/10. Pt now concerned because the chest pain had radiated down his arms prior to taking nitro. Pt states off of his pain is completely gone at this time.

## 2019-05-20 NOTE — ED Provider Notes (Signed)
[facility] Emergency Department Provider Note  ____________________________________________   First MD Initiated Contact with Patient 05/20/19 0259     (approximate)  I have reviewed the triage vital signs and the nursing notes.   HISTORY  Chief Complaint Chest Pain   HPI Devin Klus. is a 74 y.o. male with below list of previous medical conditions including hypertension diabetes previous myocardial infarction status post PCI with 2 stent placement approximately 15 years ago and recent ED visit on 05/16/2019 returns to the emergency department  chest pain that reoccurred tonight at 6:00 PM.  Patient states that the pain improved after approximately 10 minutes but then subsequently became worse again before bedtime.  Patient states that he took 2 sublingual nitroglycerin that improved the pain from a score 5 out of 10 to 0 out of 10.  Patient states that the pain radiated to the bilateral arms.  Patient does also admit to dyspnea at the time.  Patient has no symptoms at present.        Past Medical History:  Diagnosis Date  . Diabetes mellitus without complication ([place])   . Hypertension   . MI, old     Patient Active Problem List   Diagnosis Date Noted  . Ischemic chest pain ([place]) 05/16/2019  . CAD (coronary artery disease) 05/16/2019  . Type 2 diabetes mellitus without complication ([place]) [id number]/[id number]  . Essential hypertension 05/16/2019  . Depression 05/16/2019  . GERD (gastroesophageal reflux disease) 05/16/2019    Past Surgical History:  Procedure Laterality Date  . CORONARY ANGIOPLASTY WITH STENT PLACEMENT      Prior to Admission medications   Medication Sig Start Date End Date Taking? Authorizing Provider  acetaminophen (TYLENOL) 500 MG tablet Take 1,000 mg by mouth at bedtime.    [provider]  ascorbic acid (VITAMIN C) 500 MG tablet Take 500 mg by mouth daily.    [provider]  aspirin 81 MG chewable tablet  Chew 81 mg by mouth daily. 11/07/18   [provider]  atorvastatin (LIPITOR) 80 MG tablet Take 1 tablet (80 mg total) by mouth daily. 05/16/19 05/15/20  Agbata, Royce Macadamia, MD  benazepril (LOTENSIN) 20 MG tablet Take 20 mg by mouth daily. 03/19/19   [provider]  carvedilol (COREG) 25 MG tablet Take 1 tablet (25 mg total) by mouth 2 (two) times daily. 05/16/19 05/15/20  Collier Bullock, MD  Cholecalciferol 125 MCG (5000 UT) TABS Take 5,000 Units by mouth daily.    [provider]  clopidogrel (PLAVIX) 75 MG tablet Take 1 tablet (75 mg total) by mouth daily. 05/16/19 05/15/20  Collier Bullock, MD  esomeprazole (NEXIUM) 20 MG capsule Take 20 mg by mouth daily.    [provider]  ezetimibe (ZETIA) 10 MG tablet Take 10 mg by mouth daily. 04/06/19   [provider]  gabapentin (NEURONTIN) 300 MG capsule Take 300 mg by mouth 2 (two) times daily. 05/01/19   [provider]  isosorbide mononitrate (IMDUR) 30 MG 24 hr tablet Take 1 tablet (30 mg total) by mouth daily. 05/16/19   Collier Bullock, MD  metFORMIN (GLUCOPHAGE) 500 MG tablet Take 500 mg by mouth 2 (two) times daily. 02/17/19   [provider]  Multiple Vitamin (MULTI-VITAMIN) tablet Take 1 tablet by mouth daily. 01/06/10   [provider]  PARoxetine (PAXIL) 30 MG tablet Take 30 mg by mouth daily. 03/19/19   [provider]    Allergies Patient has no known allergies.  No family history on file.  Social History Social History   Tobacco Use  . Smoking status: Former Games developer  . Smokeless tobacco: Never Used  Substance Use Topics  . Alcohol use: Yes  . Drug use: Never    Review of Systems Constitutional: No fever/chills Eyes: No visual changes. ENT: No sore throat. Cardiovascular: Positive for chest pain now resolved Respiratory: Denies shortness of breath. Gastrointestinal: No abdominal pain.  No nausea, no vomiting.  No diarrhea.  No constipation. Genitourinary:  Negative for dysuria. Musculoskeletal: Negative for neck pain.  Negative for back pain. Integumentary: Negative for rash. Neurological: Negative for headaches, focal weakness or numbness.   ____________________________________________   PHYSICAL EXAM:  VITAL SIGNS: ED Triage Vitals [05/20/19 0149]  Enc Vitals Group     BP 110/61     Pulse Rate 73     Resp 20     Temp 97.9 F (36.6 C)     Temp Source Oral     SpO2 96 %     Weight 102.1 kg (225 lb)     Height 1.829 m (6')     Head Circumference      Peak Flow      Pain Score 0     Pain Loc      Pain Edu?      Excl. in GC?     Constitutional: Alert and oriented.  Eyes: Conjunctivae are normal.  Mouth/Throat: Patient is wearing a mask. Neck: No stridor.  No meningeal signs.   Cardiovascular: Normal rate, regular rhythm. Good peripheral circulation. Grossly normal heart sounds. Respiratory: Normal respiratory effort.  No retractions. Gastrointestinal: Soft and nontender. No distention.  Musculoskeletal: No lower extremity tenderness nor edema. No gross deformities of extremities. Neurologic:  Normal speech and language. No gross focal neurologic deficits are appreciated.  Skin:  Skin is warm, dry and intact. Psychiatric: Mood and affect are normal. Speech and behavior are normal.  ____________________________________________   LABS (all labs ordered are listed, but only abnormal results are displayed)  Labs Reviewed  BASIC METABOLIC PANEL - Abnormal; Notable for the following components:      Result Value   Glucose, Bld 164 (*)    Calcium 8.7 (*)    All other components within normal limits  CBC  TROPONIN I (HIGH SENSITIVITY)  TROPONIN I (HIGH SENSITIVITY)   ____________________________________________  EKG  ED ECG REPORT I, Trinity N Tatiana Courter, the attending physician, personally viewed and interpreted this ECG.   Date: 05/20/2019  EKG Time: 1:43 AM  Rate: 76  Rhythm: Normal sinus rhythm  Axis: Normal   Intervals: Normal  ST&T Change: None  ____________________________________________  RADIOLOGY I, Chambers N Jamika Sadek, personally viewed and evaluated these images (plain radiographs) as part of my medical decision making, as well as reviewing the written report by the radiologist.  ED MD interpretation: No active disease noted on chest x-ray per radiologist.  Official radiology report(s): DG Chest Port 1 View  Result Date: 05/20/2019 CLINICAL DATA:  Chest pain. EXAM: PORTABLE CHEST 1 VIEW COMPARISON:  May 16, 2019 FINDINGS: A stimulator pack is noted projecting over the right chest wall. This is stable in positioning. There is an additional metallic radiopaque foreign body inferiorly on the right chest wall which is stable from prior study. The heart size is stable. Aortic calcifications are noted. There is no pneumothorax. No significant pleural effusion. No focal infiltrate. IMPRESSION: No active disease. Electronically Signed   By: Katherine Mantle M.D.   On: 05/20/2019 02:15  ____________________________________________   PROCEDURES   Procedure(s) performed (including Critical Care):  Procedures   ____________________________________________   INITIAL IMPRESSION / MDM / ASSESSMENT AND PLAN / ED COURSE  As part of my medical decision making, I reviewed the following data within the electronic MEDICAL RECORD NUMBER   74 year old male presented with above-stated history and physical exam secondary to chest pain.  Considered possibly of ACS and as such EKG performed revealed no evidence of ischemia or infarction.  Laboratory data revealed normal high-sensitivity troponin of 12.  ____________________________________________  FINAL CLINICAL IMPRESSION(S) / ED DIAGNOSES  Final diagnoses:  Chest pain, unspecified type     MEDICATIONS GIVEN DURING THIS VISIT:  Medications  sodium chloride flush (NS) 0.9 % injection 3 mL (has no administration in time range)  aspirin chewable  tablet 81 mg (has no administration in time range)  atorvastatin (LIPITOR) tablet 80 mg (has no administration in time range)  benazepril (LOTENSIN) tablet 20 mg (has no administration in time range)  carvedilol (COREG) tablet 25 mg (has no administration in time range)  ezetimibe (ZETIA) tablet 10 mg (has no administration in time range)  isosorbide mononitrate (IMDUR) 24 hr tablet 30 mg (has no administration in time range)  PARoxetine (PAXIL) tablet 30 mg (has no administration in time range)  pantoprazole (PROTONIX) EC tablet 40 mg (has no administration in time range)  clopidogrel (PLAVIX) tablet 75 mg (has no administration in time range)  nitroGLYCERIN (NITROSTAT) SL tablet 0.4 mg (has no administration in time range)  acetaminophen (TYLENOL) tablet 650 mg (has no administration in time range)  ondansetron (ZOFRAN) injection 4 mg (has no administration in time range)  heparin injection 5,000 Units (has no administration in time range)  insulin aspart (novoLOG) injection 0-15 Units (has no administration in time range)     ED Discharge Orders    None      *Please note:  Devin [name]. was evaluated in Emergency Department on 05/20/2019 for the symptoms described in the history of present illness. He was evaluated in the context of the global COVID-19 pandemic, which necessitated consideration that the patient might be at risk for infection with the SARS-CoV-2 virus that causes COVID-19. Institutional protocols and algorithms that pertain to the evaluation of patients at risk for COVID-19 are in a state of rapid change based on information released by regulatory bodies including the CDC and federal and state organizations. These policies and algorithms were followed during the patient's care in the ED.  Some ED evaluations and interventions may be delayed as a result of limited staffing during the pandemic.*  Note:  This document was prepared using Dragon voice recognition  software and may include unintentional dictation errors.   Darci Current, MD 05/20/19 [phone]

## 2019-05-20 NOTE — H&P (Signed)
History and Physical    Kamir Selover [employer]. IDP:[id number] DOB: 04-May-1945 DOA: 05/20/2019  PCP: Hortencia Pilar, MD   Patient coming from: home  I have personally briefly reviewed patient's old medical records in [place]  Chief Complaint: chest pain  HPI: Ellwyn Ergle. is a 74 y.o. male with medical history significant for CAD, diabetes mellitus and hypertension who presents to emergency room with complaints of midsternal chest tightness that occurred while at rest.He described the chest tightness as pressure like, of moderate intensity and radiating down both arms.  He had no associated nausea vomiting diaphoresis, but did have mild shortness of breath. He took three baby aspirin without relief. Subsequently resolved after two nitroglycerin SL tablets and has not recurred completely resolved.  Patient was recently admitted to observation on 05/16/2019 for chest pain and ruled out for ACS and opted for outpatient work-up.  At discharge, Plavix and isosorbide were added, Coreg increased to 25 twice daily and atorvastatin increased to 80 mg daily.  Patient states he was compliant with the new regimen.   He had a stress test yesterday on 05/19/2019 at Dr. Laurelyn Sickle office but does not know the results as yet.  He was chest pain-free but due to concern for recurrent chest pain he came into the emergency room ED Course: On arrival in the emergency room patient was chest pain-free.  Vitals were within normal limits.  EKG showed no acute ST-T wave changes and troponin was 12.  Other blood work within normal limits.  Pathologist consulted for admission for rule out and possibly additional work-up.  Review of Systems: As per HPI otherwise 10 point review of systems negative.    Past Medical History:  Diagnosis Date  . Diabetes mellitus without complication ([place])   . Hypertension   . MI, old     Past Surgical History:  Procedure Laterality Date  . CORONARY ANGIOPLASTY WITH STENT  PLACEMENT       reports that he has quit smoking. He has never used smokeless tobacco. He reports current alcohol use. He reports that he does not use drugs.  No Known Allergies  No family history on file.   Prior to Admission medications   Medication Sig Start Date End Date Taking? Authorizing Provider  acetaminophen (TYLENOL) 500 MG tablet Take 1,000 mg by mouth at bedtime.    [provider]  ascorbic acid (VITAMIN C) 500 MG tablet Take 500 mg by mouth daily.    [provider]  aspirin 81 MG chewable tablet Chew 81 mg by mouth daily. 11/07/18   [provider]  atorvastatin (LIPITOR) 80 MG tablet Take 1 tablet (80 mg total) by mouth daily. 05/16/19 05/15/20  Agbata, Royce Macadamia, MD  benazepril (LOTENSIN) 20 MG tablet Take 20 mg by mouth daily. 03/19/19   [provider]  carvedilol (COREG) 25 MG tablet Take 1 tablet (25 mg total) by mouth 2 (two) times daily. 05/16/19 05/15/20  Collier Bullock, MD  Cholecalciferol 125 MCG (5000 UT) TABS Take 5,000 Units by mouth daily.    [provider]  clopidogrel (PLAVIX) 75 MG tablet Take 1 tablet (75 mg total) by mouth daily. 05/16/19 05/15/20  Collier Bullock, MD  esomeprazole (NEXIUM) 20 MG capsule Take 20 mg by mouth daily.    [provider]  ezetimibe (ZETIA) 10 MG tablet Take 10 mg by mouth daily. 04/06/19   [provider]  gabapentin (NEURONTIN) 300 MG capsule Take 300 mg by mouth 2 (two)  times daily. 05/01/19   [provider]  isosorbide mononitrate (IMDUR) 30 MG 24 hr tablet Take 1 tablet (30 mg total) by mouth daily. 05/16/19   Lucile Shutters, MD  metFORMIN (GLUCOPHAGE) 500 MG tablet Take 500 mg by mouth 2 (two) times daily. 02/17/19   [provider]  Multiple Vitamin (MULTI-VITAMIN) tablet Take 1 tablet by mouth daily. 01/06/10   [provider]  PARoxetine (PAXIL) 30 MG tablet Take 30 mg by mouth daily. 03/19/19   [provider]    Physical  Exam: Vitals:   05/20/19 0149 05/20/19 0209  BP: 110/61 121/72  Pulse: 73   Resp: 20 18  Temp: 97.9 F (36.6 C)   TempSrc: Oral   SpO2: 96% 95%  Weight: 102.1 kg   Height: 6' (1.829 m)      Vitals:   05/20/19 0149 05/20/19 0209  BP: 110/61 121/72  Pulse: 73   Resp: 20 18  Temp: 97.9 F (36.6 C)   TempSrc: Oral   SpO2: 96% 95%  Weight: 102.1 kg   Height: 6' (1.829 m)     Constitutional: NAD, alert and oriented x 3 Eyes: PERRL, lids and conjunctivae normal ENMT: Mucous membranes are moist.  Neck: normal, supple, no masses, no thyromegaly Respiratory: clear to auscultation bilaterally, no wheezing, no crackles. Normal respiratory effort. No accessory muscle use.  Cardiovascular: Regular rate and rhythm, no murmurs / rubs / gallops. No extremity edema. 2+ pedal pulses. No carotid bruits.  Abdomen: no tenderness, no masses palpated. No hepatosplenomegaly. Bowel sounds positive.  Musculoskeletal: no clubbing / cyanosis. No joint deformity upper and lower extremities.  Skin: no rashes, lesions, ulcers.  Neurologic: No gross focal neurologic deficit. Psychiatric: Normal mood and affect.   Labs on Admission: I have personally reviewed following labs and imaging studies  CBC: Recent Labs  Lab 05/16/19 0610 05/20/19 0155  WBC 9.5 9.7  HGB 15.8 14.4  HCT 46.7 42.0  MCV 93.2 93.1  PLT 273 240   Basic Metabolic Panel: Recent Labs  Lab 05/16/19 0610 05/16/19 0749 05/20/19 0155  NA 135 132* 135  K 3.7 4.1 4.2  CL 98 93* 102  CO2 25 24 27   GLUCOSE 145* 149* 164*  BUN 14 13 14   CREATININE 0.70 0.70 0.81  CALCIUM 9.0 9.3 8.7*   GFR: Estimated Creatinine Clearance: 97.4 mL/min (by C-G formula based on SCr of 0.81 mg/dL). Liver Function Tests: No results for input(s): AST, ALT, ALKPHOS, BILITOT, PROT, ALBUMIN in the last 168 hours. No results for input(s): LIPASE, AMYLASE in the last 168 hours. No results for input(s): AMMONIA in the last 168 hours. Coagulation  Profile: No results for input(s): INR, PROTIME in the last 168 hours. Cardiac Enzymes: No results for input(s): CKTOTAL, CKMB, CKMBINDEX, TROPONINI in the last 168 hours. BNP (last 3 results) No results for input(s): PROBNP in the last 8760 hours. HbA1C: No results for input(s): HGBA1C in the last 72 hours. CBG: Recent Labs  Lab 05/16/19 0820 05/16/19 1143  GLUCAP 151* 138*   Lipid Profile: No results for input(s): CHOL, HDL, LDLCALC, TRIG, CHOLHDL, LDLDIRECT in the last 72 hours. Thyroid Function Tests: No results for input(s): TSH, T4TOTAL, FREET4, T3FREE, THYROIDAB in the last 72 hours. Anemia Panel: No results for input(s): VITAMINB12, FOLATE, FERRITIN, TIBC, IRON, RETICCTPCT in the last 72 hours. Urine analysis: No results found for: COLORURINE, APPEARANCEUR, LABSPEC, PHURINE, GLUCOSEU, HGBUR, BILIRUBINUR, KETONESUR, PROTEINUR, UROBILINOGEN, NITRITE, LEUKOCYTESUR  Radiological Exams on Admission: DG Chest [facility] 1 [address] [place] [address]  Result Date: 05/20/2019 CLINICAL DATA:  Chest pain. EXAM: PORTABLE CHEST 1 VIEW COMPARISON:  May 16, 2019 FINDINGS: A stimulator pack is noted projecting over the right chest wall. This is stable in positioning. There is an additional metallic radiopaque foreign body inferiorly on the right chest wall which is stable from prior study. The heart size is stable. Aortic calcifications are noted. There is no pneumothorax. No significant pleural effusion. No focal infiltrate. IMPRESSION: No active disease. Electronically Signed   By: Katherine Mantle M.D.   On: 05/20/2019 02:15    EKG: Independently reviewed.   Assessment/Plan Active Problems:   Ischemic chest pain (HCC)   CAD (coronary artery disease) -Patient presented with typical chest pain that improved after 2 nitroglycerin tablets -Continue to cycle enzymes to evaluate for ACS -Continue aspirin, Plavix, isosorbide dose carvedilol and high-dose statin as recommended by Dr. Welton Flakes during his recent  hospitalization. -cardiology consult    Type 2 diabetes mellitus without complication (HCC) -Hold home Metformin -Sliding scale insulin    Essential hypertension -Normotensive.  Continue home meds.  Cipro and Coreg    Depression -Continue paroxetine    GERD (gastroesophageal reflux disease) -Continue PPI, Protonix      DVT prophylaxis: lovenox  Code Status: full code  Family Communication: none  Disposition Plan: Back to previous home environment Consults called: Dr Welton Flakes, Cardiology    Andris Baumann MD [facility]     05/20/2019, 3:20 AM

## 2019-05-20 NOTE — Progress Notes (Signed)
Pt had what appears to be a 13 beat run of vtach at 1757. He was asymptomatic and sitting in recliner at that time. Both Cardiologist and Hospitalist notified of Vitach. No new orders received. Pt will be NPO after midnight for planned cardiac cath in am.

## 2019-05-21 ENCOUNTER — Encounter
Admission: EM | Disposition: A | Discharge status: Other Acute Inpatient Hospital | Payer: Self-pay | Source: Home / Self Care | Attending: Internal Medicine

## 2019-05-21 ENCOUNTER — Encounter: Payer: Self-pay | Admitting: Cardiology

## 2019-05-21 DIAGNOSIS — R079 Chest pain, unspecified: Secondary | ICD-10-CM | POA: Diagnosis not present

## 2019-05-21 DIAGNOSIS — I10 Essential (primary) hypertension: Secondary | ICD-10-CM | POA: Diagnosis present

## 2019-05-21 DIAGNOSIS — Z955 Presence of coronary angioplasty implant and graft: Secondary | ICD-10-CM | POA: Diagnosis not present

## 2019-05-21 DIAGNOSIS — Z87891 Personal history of nicotine dependence: Secondary | ICD-10-CM | POA: Diagnosis not present

## 2019-05-21 DIAGNOSIS — Z7982 Long term (current) use of aspirin: Secondary | ICD-10-CM | POA: Diagnosis not present

## 2019-05-21 DIAGNOSIS — Z79899 Other long term (current) drug therapy: Secondary | ICD-10-CM | POA: Diagnosis not present

## 2019-05-21 DIAGNOSIS — Z7984 Long term (current) use of oral hypoglycemic drugs: Secondary | ICD-10-CM | POA: Diagnosis not present

## 2019-05-21 DIAGNOSIS — I25110 Atherosclerotic heart disease of native coronary artery with unstable angina pectoris: Principal | ICD-10-CM

## 2019-05-21 DIAGNOSIS — I200 Unstable angina: Secondary | ICD-10-CM | POA: Diagnosis not present

## 2019-05-21 DIAGNOSIS — E119 Type 2 diabetes mellitus without complications: Secondary | ICD-10-CM | POA: Diagnosis present

## 2019-05-21 DIAGNOSIS — R0789 Other chest pain: Secondary | ICD-10-CM | POA: Diagnosis present

## 2019-05-21 DIAGNOSIS — F329 Major depressive disorder, single episode, unspecified: Secondary | ICD-10-CM | POA: Diagnosis present

## 2019-05-21 DIAGNOSIS — K219 Gastro-esophageal reflux disease without esophagitis: Secondary | ICD-10-CM | POA: Diagnosis present

## 2019-05-21 DIAGNOSIS — I252 Old myocardial infarction: Secondary | ICD-10-CM | POA: Diagnosis not present

## 2019-05-21 DIAGNOSIS — Z7902 Long term (current) use of antithrombotics/antiplatelets: Secondary | ICD-10-CM | POA: Diagnosis not present

## 2019-05-21 HISTORY — PX: LEFT HEART CATH AND CORS/GRAFTS ANGIOGRAPHY: CATH118250

## 2019-05-21 LAB — BASIC METABOLIC PANEL
Anion gap: 8 (ref 5–15)
BUN: 12 mg/dL (ref 8–23)
CO2: 24 mmol/L (ref 22–32)
Calcium: 8.8 mg/dL — ABNORMAL LOW (ref 8.9–10.3)
Chloride: 107 mmol/L (ref 98–111)
Creatinine, Ser: 0.84 mg/dL (ref 0.61–1.24)
GFR calc Af Amer: >60 mL/min (ref >60)
GFR calc non Af Amer: >60 mL/min (ref >60)
Glucose, Bld: 117 mg/dL — ABNORMAL HIGH (ref 70–99)
Potassium: 4 mmol/L (ref 3.5–5.1)
Sodium: 139 mmol/L (ref 135–145)

## 2019-05-21 LAB — GLUCOSE, CAPILLARY
Glucose-Capillary: 141 mg/dL — ABNORMAL HIGH (ref 70–99)
Glucose-Capillary: 158 mg/dL — ABNORMAL HIGH (ref 70–99)
Glucose-Capillary: 93 mg/dL (ref 70–99)
Glucose-Capillary: 97 mg/dL (ref 70–99)

## 2019-05-21 LAB — MAGNESIUM: Magnesium: 2.2 mg/dL (ref 1.7–2.4)

## 2019-05-21 LAB — HEPARIN LEVEL (UNFRACTIONATED): Heparin Unfractionated: 0.55 IU/mL (ref 0.30–0.70)

## 2019-05-21 LAB — APTT: aPTT: 33 seconds (ref 24–36)

## 2019-05-21 SURGERY — LEFT HEART CATH AND CORS/GRAFTS ANGIOGRAPHY
Anesthesia: Moderate Sedation

## 2019-05-21 MED ORDER — LABETALOL HCL 5 MG/ML IV SOLN: 10.0000 mg | INTRAVENOUS | Status: AC | PRN

## 2019-05-21 MED ORDER — HEPARIN (PORCINE) IN NACL 1000-0.9 UT/500ML-% IV SOLN
INTRAVENOUS | Status: DC | PRN
  Administered 2019-05-21: 500 mL

## 2019-05-21 MED ORDER — MIDAZOLAM HCL 2 MG/2ML IJ SOLN
INTRAMUSCULAR | Status: AC
  Filled 2019-05-21: qty 2

## 2019-05-21 MED ORDER — HEPARIN (PORCINE) IN NACL 1000-0.9 UT/500ML-% IV SOLN
INTRAVENOUS | Status: AC
  Filled 2019-05-21: qty 1000

## 2019-05-21 MED ORDER — ONDANSETRON HCL 4 MG/2ML IJ SOLN: 4.0000 mg | Freq: Four times a day (QID) | INTRAMUSCULAR | Status: DC | PRN

## 2019-05-21 MED ORDER — HEPARIN (PORCINE) 25000 UT/250ML-% IV SOLN
1250.0000 [IU]/h | INTRAVENOUS | Status: DC
  Administered 2019-05-21: 1250 [IU]/h via INTRAVENOUS
  Filled 2019-05-21: qty 250

## 2019-05-21 MED ORDER — IOHEXOL 300 MG/ML  SOLN
INTRAMUSCULAR | Status: DC | PRN
  Administered 2019-05-21: 105 mL

## 2019-05-21 MED ORDER — MIDAZOLAM HCL 2 MG/2ML IJ SOLN
INTRAMUSCULAR | Status: DC | PRN
  Administered 2019-05-21: 1 mg via INTRAVENOUS

## 2019-05-21 MED ORDER — HEPARIN BOLUS VIA INFUSION
4000.0000 [IU] | Freq: Once | INTRAVENOUS | Status: AC
  Administered 2019-05-21: 4000 [IU] via INTRAVENOUS
  Filled 2019-05-21: qty 4000

## 2019-05-21 MED ORDER — HEPARIN (PORCINE) 25000 UT/250ML-% IV SOLN: 1250.0000 [IU]/h | INTRAVENOUS | Status: DC

## 2019-05-21 MED ORDER — HYDRALAZINE HCL 20 MG/ML IJ SOLN: 10.0000 mg | INTRAMUSCULAR | Status: AC | PRN

## 2019-05-21 MED ORDER — FENTANYL CITRATE (PF) 100 MCG/2ML IJ SOLN
INTRAMUSCULAR | Status: DC | PRN
  Administered 2019-05-21: 50 ug via INTRAVENOUS

## 2019-05-21 MED ORDER — SODIUM CHLORIDE 0.9% FLUSH: 3.0000 mL | INTRAVENOUS | Status: DC | PRN

## 2019-05-21 MED ORDER — FENTANYL CITRATE (PF) 100 MCG/2ML IJ SOLN
INTRAMUSCULAR | Status: AC
  Filled 2019-05-21: qty 2

## 2019-05-21 MED ORDER — SODIUM CHLORIDE 0.9 % IV SOLN: 250.0000 mL | INTRAVENOUS | Status: DC | PRN

## 2019-05-21 MED ORDER — ACETAMINOPHEN 325 MG PO TABS: 650.0000 mg | ORAL_TABLET | ORAL | Status: DC | PRN

## 2019-05-21 MED ORDER — SODIUM CHLORIDE 0.9% FLUSH
3.0000 mL | Freq: Two times a day (BID) | INTRAVENOUS | Status: DC
  Administered 2019-05-21 (×2): 3 mL via INTRAVENOUS

## 2019-05-21 MED ORDER — SODIUM CHLORIDE 0.9 % WEIGHT BASED INFUSION
1.0000 mL/kg/h | INTRAVENOUS | Status: AC
  Administered 2019-05-21: 1 mL/kg/h via INTRAVENOUS

## 2019-05-21 MED ORDER — HEPARIN (PORCINE) 25000 UT/250ML-% IV SOLN: 12.0000 [IU]/kg/h | INTRAVENOUS | Status: DC

## 2019-05-21 SURGICAL SUPPLY — 9 items
CATH INFINITI 5FR ANG PIGTAIL (CATHETERS) ×3 IMPLANT
CATH INFINITI 5FR JL4 (CATHETERS) ×3 IMPLANT
CATH INFINITI JR4 5F (CATHETERS) ×3 IMPLANT
DEVICE CLOSURE MYNXGRIP 5F (Vascular Products) ×3 IMPLANT
KIT MANI 3VAL PERCEP (MISCELLANEOUS) ×3 IMPLANT
NEEDLE PERC 18GX7CM (NEEDLE) ×3 IMPLANT
PACK CARDIAC CATH (CUSTOM PROCEDURE TRAY) ×3 IMPLANT
SHEATH AVANTI 5FR X 11CM (SHEATH) ×3 IMPLANT
WIRE GUIDERIGHT .035X150 (WIRE) ×3 IMPLANT

## 2019-05-21 NOTE — Plan of Care (Signed)
  Problem: Pain Managment: Goal: General experience of comfort will improve Outcome: Progressing  Pain free this shift.  No change in condition noted.

## 2019-05-21 NOTE — Progress Notes (Signed)
Called and gave report to [place] at [facility].   Transport will call back to give time for pick up.

## 2019-05-21 NOTE — Progress Notes (Signed)
SUBJECTIVE: Patient denies any chest pain or shortness of breath  Vitals:   05/20/19 1925 05/21/19 0344 05/21/19 0616 05/21/19 0755  BP: (!) 146/87 126/77 133/85 140/80  Pulse: 84 85 81 86  Resp: 20 20 17  (!) 21  Temp: 97.9 F (36.6 C) 98 F (36.7 C) 98 F (36.7 C) 98 F (36.7 C)  TempSrc: Oral Oral Oral Oral  SpO2: 98% 96% 96% 96%  Weight:    102.9 kg  Height:    6' (1.829 m)    Intake/Output Summary (Last 24 hours) at 05/21/2019 0925 Last data filed at 05/21/2019 0407 Gross per 24 hour  Intake 594.99 ml  Output 600 ml  Net -5.01 ml    LABS: Basic Metabolic Panel: Recent Labs    05/20/19 0155 05/20/19 0408 05/20/19 2018 05/21/19 0423  NA 135   < > 136 139  K 4.2   < > 4.3 4.0  CL 102   < > 101 107  CO2 27   < > 27 24  GLUCOSE 164*   < > 116* 117*  BUN 14   < > 13 12  CREATININE 0.81   < > 0.91 0.84  CALCIUM 8.7*   < > 9.2 8.8*  MG 2.1  --   --  2.2   < > = values in this interval not displayed.   Liver Function Tests: Recent Labs    05/20/19 0408  AST 25  ALT 30  ALKPHOS 40  BILITOT 0.5  PROT 6.7  ALBUMIN 3.9   No results for input(s): LIPASE, AMYLASE in the last 72 hours. CBC: Recent Labs    05/20/19 0155 05/20/19 2018  WBC 9.7 9.9  HGB 14.4 14.9  HCT 42.0 44.5  MCV 93.1 95.5  PLT 240 264   Cardiac Enzymes: No results for input(s): CKTOTAL, CKMB, CKMBINDEX, TROPONINI in the last 72 hours. BNP: Invalid input(s): POCBNP D-Dimer: No results for input(s): DDIMER in the last 72 hours. Hemoglobin A1C: No results for input(s): HGBA1C in the last 72 hours. Fasting Lipid Panel: No results for input(s): CHOL, HDL, LDLCALC, TRIG, CHOLHDL, LDLDIRECT in the last 72 hours. Thyroid Function Tests: No results for input(s): TSH, T4TOTAL, T3FREE, THYROIDAB in the last 72 hours.  Invalid input(s): FREET3 Anemia Panel: No results for input(s): VITAMINB12, FOLATE, FERRITIN, TIBC, IRON, RETICCTPCT in the last 72 hours.   PHYSICAL EXAM General: Well  developed, well nourished, in no acute distress HEENT:  Normocephalic and atramatic Neck:  No JVD.  Lungs: Clear bilaterally to auscultation and percussion. Heart: HRRR . Normal S1 and S2 without gallops or murmurs.  Abdomen: Bowel sounds are positive, abdomen soft and non-tender  Msk:  Back normal, normal gait. Normal strength and tone for age. Extremities: No clubbing, cyanosis or edema.   Neuro: Alert and oriented X 3. Psych:  Good affect, responds appropriately  TELEMETRY: Sinus rhythm  ASSESSMENT AND PLAN: Patient has critical ostial LAD 99% with stent in the circumflex LAD and RCA patent with borderline left ventricular systolic function.  Advise stopping Plavix and discussed with [facility] for CABG and patient will be transferred for CABG.I spoke to [facility], [phone]  Active Problems:   Ischemic chest pain (HCC)   CAD (coronary artery disease)   Type 2 diabetes mellitus without complication (HCC)   Essential hypertension   Depression   GERD (gastroesophageal reflux disease)    [phone], MD, [facility] 05/21/2019 9:25 AM

## 2019-05-21 NOTE — Consult Note (Signed)
ANTICOAGULATION CONSULT NOTE - Initial Consult  Pharmacy Consult for Heparin infusion Indication: chest pain/ACS  Allergies  Allergen Reactions  . 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    Patient Measurements: Height: 6' (182.9 cm) Weight: 226 lb 13.7 oz (102.9 kg) IBW/kg (Calculated) : 77.6 Heparin Dosing Weight: 98.8 kg  Vital Signs: Temp: 97.6 F (36.4 C) (03/11 1154) Temp Source: Oral (03/11 1154) BP: 117/78 (03/11 1154) Pulse Rate: 88 (03/11 1154)  Labs: Recent Labs    05/20/19 0155 05/20/19 0155 05/20/19 0408 05/20/19 2018 05/21/19 0423  HGB 14.4  --   --  14.9  --   HCT 42.0  --   --  44.5  --   PLT 240  --   --  264  --   LABPROT  --   --   --  13.3  --   INR  --   --   --  1.0  --   CREATININE 0.81   < > 0.77 0.91 0.84  TROPONINIHS 12  --  14  --   --    < > = values in this interval not displayed.    Estimated Creatinine Clearance: 94.3 mL/min (by C-G formula based on SCr of 0.84 mg/dL).   Medical History: Past Medical History:  Diagnosis Date  . Diabetes mellitus without complication (HCC)   . Hypertension   . MI, old     Medications:  No anticoagulation prior to admission  Assessment: Patient is a 74 y/o M with history significant for CAD s/p stenting who is admitted with chest pain. Patient with for cardiac catheterization today with findings significant for LM to Prox LAD lesion stenosis of 99%. He is pending transfer to [facility] for CABG evaluation. Pharmacy has been consulted to initiate heparin infusion.   H&H, platelets WNL. Baseline INR 1, aPTT pending.   Goal of Therapy:  Heparin level 0.3-0.7 units/ml Monitor platelets by anticoagulation protocol: Yes   Plan:  -Heparin 4000 units IV bolus x 1 followed by maintenance infusion at 1250 units/hr -Heparin level 8 hours after initiation of infusion -Daily CBC per protocol  Tressie Ellis  Pharmacy Resident 05/21/2019,1:07 PM

## 2019-05-21 NOTE — Discharge Summary (Signed)
Physician Discharge Summary  Devin Gurney Trickel Jr. HEN:[id number] DOB: 27-Mar-1945 DOA: 05/20/2019  PCP: Hortencia Pilar, MD  Admit date: 05/20/2019 Discharge date: 05/21/2019  Admitted From: home Disposition:  Transfer to [facility]  Recommendations for Outpatient Follow-up:  1. Per discharging providers at [facility]: n/a  Equipment/Devices: n/a   Discharge Condition: stable  CODE STATUS: full  Diet recommendation: Heart Healthy / Carb Modified   Brief/Interim Summary:  Devin Rao. is a 74 y.o. male with medical history significant for CAD,diabetes mellitus and hypertension who presents to emergency room with complaints ofmidsternalchest tightness that occurred while at rest.  Recently had outpatient stress test with Dr. Humphrey Rolls but had not yet received results.  Admitted for observation, troponins trended and negative.  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  Patient will require CABG.  Plavix stopped.  Cardiology team discussed case with CT Surgery at [facility], patient was accepted and will be transferred pending bed available.     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   ascorbic acid 500 MG tablet Commonly known as: VITAMIN C Take 500 mg by mouth daily.   aspirin 81 MG chewable tablet Chew 81 mg by mouth daily.   atorvastatin 80 MG tablet Commonly known as: Lipitor Take 1 tablet (80 mg total) by mouth daily.   benazepril 20 MG tablet Commonly known as: LOTENSIN Take 20 mg by mouth daily.   carvedilol 25 MG tablet Commonly known as: Coreg Take 1 tablet (25 mg total) by mouth 2 (two) times daily.   Cholecalciferol 125 MCG (5000 UT) Tabs Take 5,000 Units by mouth daily.   esomeprazole 20 MG capsule Commonly known as: NEXIUM Take 20 mg by mouth daily.   ezetimibe 10 MG tablet Commonly known as: ZETIA Take 10 mg by mouth daily.   gabapentin 300 MG capsule Commonly known as: NEURONTIN Take 300 mg by mouth 2 (two) times daily.   heparin 25000-0.45 UT/250ML-% infusion Inject 1,250 Units/hr into the vein continuous.   isosorbide mononitrate 30 MG 24 hr tablet Commonly known as: IMDUR Take 1 tablet (30 mg total) by mouth daily.   metFORMIN 500 MG tablet Commonly known as: GLUCOPHAGE Take 500 mg by mouth 2 (two) times daily.   Multi-Vitamin tablet Take 1 tablet by mouth daily.   PARoxetine 30 MG tablet Commonly known as: PAXIL Take 30 mg by mouth daily.       Allergies  Allergen Reactions  . 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    Consultations:  Cardiology, Dr. Welton Flakes    Procedures/Studies: DG Chest 2 View  Result Date: 05/16/2019 CLINICAL DATA:  Chest pain EXAM: CHEST - 2 VIEW COMPARISON:  08/09/2018 FINDINGS: The heart size and mediastinal contours are within normal limits. No new consolidation or edema. Calcified granuloma of the mid left lung. Probable mild scarring at the left lung base. No acute osseous abnormality. IMPRESSION: No acute process in the chest. Electronically Signed   By: Guadlupe Spanish M.D.   On: 05/16/2019 06:31   CARDIAC CATHETERIZATION  Result Date: 05/21/2019  Ost Cx to  Prox Cx lesion is 35% stenosed.  Prox LAD to Mid LAD lesion is 40% stenosed.  Dist LM to Prox LAD lesion is 99% stenosed.  Prox RCA lesion is 30% stenosed.  Advise CABG   DG Chest Port 1 View  Result Date: 05/20/2019 CLINICAL DATA:  Chest pain. EXAM: PORTABLE CHEST 1 VIEW COMPARISON:  May 16, 2019 FINDINGS: A stimulator pack is noted projecting over the right chest wall. This is stable in positioning. There is an additional metallic radiopaque foreign body inferiorly on the right chest wall which is stable from prior study. The heart size is stable. Aortic calcifications are noted. There is no pneumothorax. No significant pleural effusion. No focal infiltrate. IMPRESSION: No active disease. Electronically Signed   By: Katherine Mantle M.D.   On: 05/20/2019 02:15      Cardiac Cath 05/21/19   Subjective: Patient denies chest pain or SOB.  No acute events reproted.  He states understanding about plan to transfer to [facility] and need for bypass surgery.  He is agreeable.  Denies any current acute complaints.    Discharge Exam: Vitals:   05/21/19 1044 05/21/19 1154  BP: (!) 143/92 117/78  Pulse: 81 88  Resp: 18 17  Temp: 97.8 F (36.6 C) 97.6 F (36.4 C)  SpO2: 97% 94%   Vitals:   05/21/19 0945 05/21/19 1000 05/21/19 1044 05/21/19 1154  BP: 136/82 129/80 (!) 143/92 117/78  Pulse: 81 80 81 88  Resp: (!) 21 20 18 17   Temp:   97.8 F (36.6 C) 97.6 F (36.4 C)  TempSrc:   Oral Oral  SpO2: 94% 95% 97% 94%  Weight:      Height:        General: Pt is alert, awake, not in acute distress Cardiovascular: RRR, S1/S2 +, no rubs, no gallops Respiratory: CTA bilaterally, no wheezing, no rhonchi Abdominal: Soft, NT, ND, bowel sounds + Extremities: no edema, no cyanosis    The results of significant diagnostics from this hospitalization (including imaging, microbiology, ancillary and laboratory) are listed below for reference.     Microbiology: Recent Results (from the past 240  hour(s))  Respiratory Panel by RT PCR (Flu A&B, Covid) - Nasopharyngeal Swab     Status: None   Collection Time: 05/16/19  6:46 AM   Specimen: Nasopharyngeal Swab  Result Value Ref Range Status   SARS Coronavirus 2 by RT PCR NEGATIVE NEGATIVE Final    Comment: (NOTE) SARS-CoV-2 target nucleic acids are NOT DETECTED. The SARS-CoV-2 RNA is generally detectable in upper respiratoy specimens during the acute phase of infection. The lowest concentration of SARS-CoV-2 viral copies this assay can detect is 131 copies/mL. A negative result does not preclude SARS-Cov-2 infection and should not be used as the sole basis for treatment or other patient management decisions. A negative result may occur with  improper specimen collection/handling, submission of specimen other than nasopharyngeal swab, presence of viral mutation(s) within the  areas targeted by this assay, and inadequate number of viral copies (<131 copies/mL). A negative result must be combined with clinical observations, patient history, and epidemiological information. The expected result is Negative. Fact Sheet for Patients:  https://www.moore.com/ Fact Sheet for Healthcare Providers:  https://www.young.biz/ This test is not yet ap proved or cleared by the [place] FDA and  has been authorized for detection and/or diagnosis of SARS-CoV-2 by FDA under an Emergency Use Authorization (EUA). This EUA will remain  in effect (meaning this test can be used) for the duration of the COVID-19 declaration under Section 564(b)(1) of the Act, 21 U.S.C. section 360bbb-3(b)(1), unless the authorization is terminated or revoked sooner.    Influenza A by PCR NEGATIVE NEGATIVE Final   Influenza B by PCR NEGATIVE NEGATIVE Final    Comment: (NOTE) The Xpert Xpress SARS-CoV-2/FLU/RSV assay is intended as an aid in  the diagnosis of influenza from Nasopharyngeal swab specimens and  should not be used as  a sole basis for treatment. Nasal washings and  aspirates are unacceptable for Xpert Xpress SARS-CoV-2/FLU/RSV  testing. Fact Sheet for Patients: https://www.moore.com/ Fact Sheet for Healthcare Providers: https://www.young.biz/ This test is not yet approved or cleared by the [place] FDA and  has been authorized for detection and/or diagnosis of SARS-CoV-2 by  FDA under an Emergency Use Authorization (EUA). This EUA will remain  in effect (meaning this test can be used) for the duration of the  Covid-19 declaration under Section 564(b)(1) of the Act, 21  U.S.C. section 360bbb-3(b)(1), unless the authorization is  terminated or revoked. Performed at [facility], [address] [place], [place] [address]      Labs: BNP (last 3 results) No results for input(s): BNP in the last 8760 hours. Basic Metabolic Panel: Recent Labs  Lab 05/16/19 0749 05/20/19 0155 05/20/19 0408 05/20/19 2018 05/21/19 0423  NA 132* 135 134* 136 139  K 4.1 4.2 4.5 4.3 4.0  CL 93* 102 102 101 107  CO2 24 27 25 27 24   GLUCOSE 149* 164* 129* 116* 117*  BUN 13 14 13 13 12   CREATININE 0.70 0.81 0.77 0.91 0.84  CALCIUM 9.3 8.7* 8.9 9.2 8.8*  MG  --  2.1  --   --  2.2   Liver Function Tests: Recent Labs  Lab 05/20/19 0408  AST 25  ALT 30  ALKPHOS 40  BILITOT 0.5  PROT 6.7  ALBUMIN 3.9   No results for input(s): LIPASE, AMYLASE in the last 168 hours. No results for input(s): AMMONIA in the last 168 hours. CBC: Recent Labs  Lab 05/16/19 0610 05/20/19 0155 05/20/19 2018  WBC 9.5 9.7 9.9  HGB 15.8 14.4 14.9  HCT 46.7 42.0 44.5  MCV 93.2 93.1 95.5  PLT 273 240 264   Cardiac Enzymes: No results for input(s): CKTOTAL, CKMB, CKMBINDEX, TROPONINI in the last 168 hours. BNP: Invalid input(s): POCBNP CBG: Recent Labs  Lab 05/20/19 1210 05/20/19 1730 05/20/19 2136 05/21/19 0801 05/21/19 1152  GLUCAP 132* 108* 119* 141* 158*    D-Dimer No results for input(s): DDIMER in the last 72 hours. Hgb A1c No results for input(s): HGBA1C in the last 72 hours. Lipid Profile No results for input(s): CHOL, HDL, LDLCALC, TRIG, CHOLHDL, LDLDIRECT in the last 72 hours. Thyroid function studies No results for input(s): TSH, T4TOTAL, T3FREE, THYROIDAB in the last 72 hours.  Invalid input(s): FREET3 Anemia work up No results for input(s): VITAMINB12, FOLATE, FERRITIN, TIBC, IRON, RETICCTPCT in the last 72 hours.  Urinalysis No results found for: COLORURINE, APPEARANCEUR, LABSPEC, PHURINE, GLUCOSEU, HGBUR, BILIRUBINUR, KETONESUR, PROTEINUR, UROBILINOGEN, NITRITE, LEUKOCYTESUR Sepsis Labs Invalid input(s): PROCALCITONIN,  WBC,  LACTICIDVEN Microbiology Recent Results (from the past 240 hour(s))  Respiratory Panel by RT PCR (Flu A&B, Covid) - Nasopharyngeal Swab     Status: None   Collection Time: 05/16/19  6:46 AM   Specimen: Nasopharyngeal Swab  Result Value Ref Range Status   SARS Coronavirus 2 by RT PCR NEGATIVE NEGATIVE Final    Comment: (NOTE) SARS-CoV-2 target nucleic acids are NOT DETECTED. The SARS-CoV-2 RNA is generally detectable in upper respiratoy specimens during the acute phase of infection. The lowest concentration of SARS-CoV-2 viral copies this assay can detect is 131 copies/mL. A negative result does not preclude SARS-Cov-2 infection and should not be used as the sole basis for treatment or other patient management decisions. A negative result may occur with  improper specimen collection/handling, submission of specimen other than nasopharyngeal swab, presence of viral mutation(s) within the areas targeted by this assay, and inadequate number of viral copies (<131 copies/mL). A negative result must be combined with clinical observations, patient history, and epidemiological information. The expected result is Negative. Fact Sheet for Patients:  https://www.moore.com/ Fact Sheet for  Healthcare Providers:  https://www.young.biz/ This test is not yet ap proved or cleared by the [place] FDA and  has been authorized for detection and/or diagnosis of SARS-CoV-2 by FDA under an Emergency Use Authorization (EUA). This EUA will remain  in effect (meaning this test can be used) for the duration of the COVID-19 declaration under Section 564(b)(1) of the Act, 21 U.S.C. section 360bbb-3(b)(1), unless the authorization is terminated or revoked sooner.    Influenza A by PCR NEGATIVE NEGATIVE Final   Influenza B by PCR NEGATIVE NEGATIVE Final    Comment: (NOTE) The Xpert Xpress SARS-CoV-2/FLU/RSV assay is intended as an aid in  the diagnosis of influenza from Nasopharyngeal swab specimens and  should not be used as a sole basis for treatment. Nasal washings and  aspirates are unacceptable for Xpert Xpress SARS-CoV-2/FLU/RSV  testing. Fact Sheet for Patients: https://www.moore.com/ Fact Sheet for Healthcare Providers: https://www.young.biz/ This test is not yet approved or cleared by the [place] FDA and  has been authorized for detection and/or diagnosis of SARS-CoV-2 by  FDA under an Emergency Use Authorization (EUA). This EUA will remain  in effect (meaning this test can be used) for the duration of the  Covid-19 declaration under Section 564(b)(1) of the Act, 21  U.S.C. section 360bbb-3(b)(1), unless the authorization is  terminated or revoked. Performed at [facility], [address] [place], [place] [address]      Time coordinating discharge: Over 30 minutes  SIGNED:   Pennie Banter, DO [facility] 05/21/2019, 3:22 PM   If 7PM-7AM, please contact night-coverage [email or web address]

## 2019-05-21 NOTE — Progress Notes (Signed)
[facility] in to pick up patient.  Tranfered care to them with belongings and Heparin continues to infuse.

## 2019-05-24 DIAGNOSIS — I200 Unstable angina: Secondary | ICD-10-CM

## 2019-05-28 DIAGNOSIS — Z951 Presence of aortocoronary bypass graft: Secondary | ICD-10-CM | POA: Insufficient documentation

## 2019-05-28 DIAGNOSIS — D62 Acute posthemorrhagic anemia: Secondary | ICD-10-CM | POA: Insufficient documentation

## 2019-05-28 DIAGNOSIS — E785 Hyperlipidemia, unspecified: Secondary | ICD-10-CM | POA: Insufficient documentation

## 2019-05-28 DIAGNOSIS — I255 Ischemic cardiomyopathy: Secondary | ICD-10-CM | POA: Insufficient documentation

## 2019-05-28 HISTORY — DX: Presence of aortocoronary bypass graft: Z95.1

## 2019-07-01 ENCOUNTER — Encounter: Payer: Medicare HMO | Attending: Cardiovascular Disease | Admitting: *Deleted

## 2019-07-01 ENCOUNTER — Other Ambulatory Visit: Payer: Self-pay

## 2019-07-01 ENCOUNTER — Encounter: Payer: Self-pay | Admitting: *Deleted

## 2019-07-01 DIAGNOSIS — Z87891 Personal history of nicotine dependence: Secondary | ICD-10-CM | POA: Insufficient documentation

## 2019-07-01 DIAGNOSIS — Z7901 Long term (current) use of anticoagulants: Secondary | ICD-10-CM | POA: Insufficient documentation

## 2019-07-01 DIAGNOSIS — Z7984 Long term (current) use of oral hypoglycemic drugs: Secondary | ICD-10-CM | POA: Insufficient documentation

## 2019-07-01 DIAGNOSIS — I252 Old myocardial infarction: Secondary | ICD-10-CM | POA: Insufficient documentation

## 2019-07-01 DIAGNOSIS — E119 Type 2 diabetes mellitus without complications: Secondary | ICD-10-CM | POA: Insufficient documentation

## 2019-07-01 DIAGNOSIS — Z951 Presence of aortocoronary bypass graft: Secondary | ICD-10-CM | POA: Insufficient documentation

## 2019-07-01 DIAGNOSIS — Z79899 Other long term (current) drug therapy: Secondary | ICD-10-CM | POA: Insufficient documentation

## 2019-07-01 DIAGNOSIS — Z7982 Long term (current) use of aspirin: Secondary | ICD-10-CM | POA: Insufficient documentation

## 2019-07-01 DIAGNOSIS — I10 Essential (primary) hypertension: Secondary | ICD-10-CM | POA: Insufficient documentation

## 2019-07-01 NOTE — Progress Notes (Signed)
Virtual Orientation completed . Has EP Eval and Gym Orientation scheduled for tomorrow.  Documentation of diagnosis can be found in [facility] 05/22/2019

## 2019-07-02 ENCOUNTER — Encounter: Payer: Medicare HMO | Admitting: *Deleted

## 2019-07-02 VITALS — Ht 71.1 in | Wt 208.9 lb

## 2019-07-02 DIAGNOSIS — Z7984 Long term (current) use of oral hypoglycemic drugs: Secondary | ICD-10-CM | POA: Diagnosis not present

## 2019-07-02 DIAGNOSIS — Z87891 Personal history of nicotine dependence: Secondary | ICD-10-CM | POA: Diagnosis not present

## 2019-07-02 DIAGNOSIS — Z79899 Other long term (current) drug therapy: Secondary | ICD-10-CM | POA: Diagnosis not present

## 2019-07-02 DIAGNOSIS — I10 Essential (primary) hypertension: Secondary | ICD-10-CM | POA: Diagnosis not present

## 2019-07-02 DIAGNOSIS — Z951 Presence of aortocoronary bypass graft: Secondary | ICD-10-CM | POA: Diagnosis present

## 2019-07-02 DIAGNOSIS — I252 Old myocardial infarction: Secondary | ICD-10-CM | POA: Diagnosis not present

## 2019-07-02 DIAGNOSIS — Z7982 Long term (current) use of aspirin: Secondary | ICD-10-CM | POA: Diagnosis not present

## 2019-07-02 DIAGNOSIS — E119 Type 2 diabetes mellitus without complications: Secondary | ICD-10-CM | POA: Diagnosis not present

## 2019-07-02 DIAGNOSIS — Z7901 Long term (current) use of anticoagulants: Secondary | ICD-10-CM | POA: Diagnosis not present

## 2019-07-02 NOTE — Patient Instructions (Signed)
Patient Instructions  Patient Details  Name: Devin [name] [facility]. MRN: [id number] Date of Birth: 15-Sep-1945 Referring Provider:  Laurier Nancy, MD  Below are your personal goals for exercise, nutrition, and risk factors. Our goal is to help you stay on track towards obtaining and maintaining these goals. We will be discussing your progress on these goals with you throughout the program.  Initial Exercise Prescription: Initial Exercise Prescription - 07/02/19 1200      Date of Initial Exercise RX and Referring Provider   Date  07/02/19    Referring Provider  Adrian Blackwater MD      Treadmill   MPH  2    Grade  0    Minutes  15    METs  2.53      Recumbant Elliptical   Level  1    RPM  50    Minutes  15    METs  2      Elliptical   Level  1    Speed  2    Minutes  15      Prescription Details   Frequency (times per week)  3    Duration  Progress to 30 minutes of continuous aerobic without signs/symptoms of physical distress      Intensity   THRR 40-80% of Max Heartrate  102-131    Ratings of Perceived Exertion  11-13    Perceived Dyspnea  0-4      Progression   Progression  Continue to progress workloads to maintain intensity without signs/symptoms of physical distress.      Resistance Training   Training Prescription  Yes    Weight  3 lb    Reps  10-15       Exercise Goals: Frequency: Be able to perform aerobic exercise two to three times per week in program working toward 2-5 days per week of home exercise.  Intensity: Work with a perceived exertion of 11 (fairly light) - 15 (hard) while following your exercise prescription.  We will make changes to your prescription with you as you progress through the program.   Duration: Be able to do 30 to 45 minutes of continuous aerobic exercise in addition to a 5 minute warm-up and a 5 minute cool-down routine.   Nutrition Goals: Your personal nutrition goals will be established when you do your nutrition analysis  with the dietician.  The following are general nutrition guidelines to follow: Cholesterol < 200mg /day Sodium < 1500mg /day Fiber: Men over 50 yrs - 30 grams per day  Personal Goals: Personal Goals and Risk Factors at Admission - 07/02/19 1259      Core Components/Risk Factors/Patient Goals on Admission    Weight Management  Yes;Weight Loss    Intervention  Weight Management: Develop a combined nutrition and exercise program designed to reach desired caloric intake, while maintaining appropriate intake of nutrient and fiber, sodium and fats, and appropriate energy expenditure required for the weight goal.;Weight Management: Provide education and appropriate resources to help participant work on and attain dietary goals.    Admit Weight  208 lb 14.4 oz (94.8 kg)    Goal Weight: Short Term  203 lb (92.1 kg)    Goal Weight: Long Term  198 lb (89.8 kg)    Expected Outcomes  Short Term: Continue to assess and modify interventions until short term weight is achieved;Long Term: Adherence to nutrition and physical activity/exercise program aimed toward attainment of established weight goal;Weight Loss: Understanding of general recommendations for  a balanced deficit meal plan, which promotes 1-2 lb weight loss per week and includes a negative energy balance of [phone] kcal/d;Understanding recommendations for meals to include 15-35% energy as protein, 25-35% energy from fat, 35-60% energy from carbohydrates, less than 200mg  of dietary cholesterol, 20-35 gm of total fiber daily;Understanding of distribution of calorie intake throughout the day with the consumption of 4-5 meals/snacks    Diabetes  Yes    Intervention  Provide education about signs/symptoms and action to take for hypo/hyperglycemia.;Provide education about proper nutrition, including hydration, and aerobic/resistive exercise prescription along with prescribed medications to achieve blood glucose in normal ranges: Fasting glucose 65-99 mg/dL     Expected Outcomes  Short Term: Participant verbalizes understanding of the signs/symptoms and immediate care of hyper/hypoglycemia, proper foot care and importance of medication, aerobic/resistive exercise and nutrition plan for blood glucose control.;Long Term: Attainment of HbA1C < 7%.    Hypertension  Yes    Intervention  Provide education on lifestyle modifcations including regular physical activity/exercise, weight management, moderate sodium restriction and increased consumption of fresh fruit, vegetables, and low fat dairy, alcohol moderation, and smoking cessation.;Monitor prescription use compliance.    Expected Outcomes  Short Term: Continued assessment and intervention until BP is < 140/34mm HG in hypertensive participants. < 130/30mm HG in hypertensive participants with diabetes, heart failure or chronic kidney disease.;Long Term: Maintenance of blood pressure at goal levels.    Lipids  Yes    Intervention  Provide education and support for participant on nutrition & aerobic/resistive exercise along with prescribed medications to achieve LDL 70mg , HDL >40mg .    Expected Outcomes  Short Term: Participant states understanding of desired cholesterol values and is compliant with medications prescribed. Participant is following exercise prescription and nutrition guidelines.;Long Term: Cholesterol controlled with medications as prescribed, with individualized exercise RX and with personalized nutrition plan. Value goals: LDL < 70mg , HDL > 40 mg.       Tobacco Use Initial Evaluation: Social History   Tobacco Use  Smoking Status Former Smoker  Smokeless Tobacco Never Used  Tobacco Comment   Quit over 40 years ago    Exercise Goals and Review: Exercise Goals    Row Name 07/02/19 1244             Exercise Goals   Increase Physical Activity  Yes       Intervention  Provide advice, education, support and counseling about physical activity/exercise needs.;Develop an individualized  exercise prescription for aerobic and resistive training based on initial evaluation findings, risk stratification, comorbidities and participant's personal goals.       Expected Outcomes  Long Term: Add in home exercise to make exercise part of routine and to increase amount of physical activity.;Long Term: Exercising regularly at least 3-5 days a week.;Short Term: Attend rehab on a regular basis to increase amount of physical activity.       Increase Strength and Stamina  Yes       Intervention  Provide advice, education, support and counseling about physical activity/exercise needs.;Develop an individualized exercise prescription for aerobic and resistive training based on initial evaluation findings, risk stratification, comorbidities and participant's personal goals.       Expected Outcomes  Short Term: Increase workloads from initial exercise prescription for resistance, speed, and METs.;Short Term: Perform resistance training exercises routinely during rehab and add in resistance training at home;Long Term: Improve cardiorespiratory fitness, muscular endurance and strength as measured by increased METs and functional capacity ( )  Able to understand and use rate of perceived exertion (RPE) scale  Yes       Intervention  Provide education and explanation on how to use RPE scale       Expected Outcomes  Short Term: Able to use RPE daily in rehab to express subjective intensity level;Long Term:  Able to use RPE to guide intensity level when exercising independently       Able to understand and use Dyspnea scale  Yes       Intervention  Provide education and explanation on how to use Dyspnea scale       Expected Outcomes  Short Term: Able to use Dyspnea scale daily in rehab to express subjective sense of shortness of breath during exertion;Long Term: Able to use Dyspnea scale to guide intensity level when exercising independently       Knowledge and understanding of Target Heart Rate Range  (THRR)  Yes       Intervention  Provide education and explanation of THRR including how the numbers were predicted and where they are located for reference       Expected Outcomes  Short Term: Able to state/look up THRR;Short Term: Able to use daily as guideline for intensity in rehab;Long Term: Able to use THRR to govern intensity when exercising independently       Able to check pulse independently  Yes       Intervention  Provide education and demonstration on how to check pulse in carotid and radial arteries.;Review the importance of being able to check your own pulse for safety during independent exercise       Expected Outcomes  Short Term: Able to explain why pulse checking is important during independent exercise;Long Term: Able to check pulse independently and accurately       Understanding of Exercise Prescription  Yes       Intervention  Provide education, explanation, and written materials on patient's individual exercise prescription       Expected Outcomes  Short Term: Able to explain program exercise prescription;Long Term: Able to explain home exercise prescription to exercise independently          Copy of goals given to participant.

## 2019-07-02 NOTE — Progress Notes (Signed)
Cardiac Individual Treatment Plan  Patient Details  Name: Devin [name] [facility]. MRN: [id number] Date of Birth: 18-Apr-1945 Referring Provider:     Cardiac Rehab from 07/02/2019 in [facility]iac and Pulmonary Rehab  Referring Provider  Neoma Laming MD      Initial Encounter Date:    Cardiac Rehab from 07/02/2019 in [facility] Cardiac and Pulmonary Rehab  Date  07/02/19      Visit Diagnosis: S/P CABG x 1  Patient's Home Medications on Admission:  Current Outpatient Medications:  .  acetaminophen (TYLENOL) 500 MG tablet, Take 1,000 mg by mouth at bedtime., Disp: , Rfl:  .  apixaban (ELIQUIS) 5 MG TABS tablet, Take by mouth., Disp: , Rfl:  .  ascorbic acid (VITAMIN C) 500 MG tablet, Take 500 mg by mouth daily., Disp: , Rfl:  .  aspirin 81 MG chewable tablet, Chew 81 mg by mouth daily., Disp: , Rfl:  .  atorvastatin (LIPITOR) 80 MG tablet, Take 1 tablet (80 mg total) by mouth daily., Disp: 30 tablet, Rfl: 11 .  benazepril (LOTENSIN) 20 MG tablet, Take 20 mg by mouth daily., Disp: , Rfl:  .  buPROPion (WELLBUTRIN XL) 300 MG 24 hr tablet, Take by mouth., Disp: , Rfl:  .  carvedilol (COREG) 25 MG tablet, Take 1 tablet (25 mg total) by mouth 2 (two) times daily., Disp: 60 tablet, Rfl: 11 .  Cholecalciferol 125 MCG (5000 UT) TABS, Take 5,000 Units by mouth daily., Disp: , Rfl:  .  Coenzyme Q10 100 MG capsule, Take by mouth., Disp: , Rfl:  .  EPINEPHrine (EPIPEN JR) 0.15 MG/0.3ML injection, Inject into the muscle., Disp: , Rfl:  .  esomeprazole (NEXIUM) 20 MG capsule, Take 20 mg by mouth daily., Disp: , Rfl:  .  ezetimibe (ZETIA) 10 MG tablet, Take 10 mg by mouth daily., Disp: , Rfl:  .  gabapentin (NEURONTIN) 300 MG capsule, Take 300 mg by mouth 2 (two) times daily., Disp: , Rfl:  .  ipratropium (ATROVENT HFA) 17 MCG/ACT inhaler, Inhale into the lungs., Disp: , Rfl:  .  ipratropium (ATROVENT) 0.02 % nebulizer solution, Inhale into the lungs., Disp: , Rfl:  .  isosorbide mononitrate (IMDUR) 30 MG  24 hr tablet, Take 1 tablet (30 mg total) by mouth daily., Disp: 30 tablet, Rfl: 1 .  metFORMIN (GLUCOPHAGE) 500 MG tablet, Take 500 mg by mouth 2 (two) times daily., Disp: , Rfl:  .  Multiple Vitamin (MULTI-VITAMIN) tablet, Take 1 tablet by mouth daily., Disp: , Rfl:  .  PARoxetine (PAXIL) 30 MG tablet, Take 30 mg by mouth daily., Disp: , Rfl:  No current facility-administered medications for this visit.  Facility-Administered Medications Ordered in Other Visits:  .  sodium chloride flush (NS) 0.9 % injection 3 mL, 3 mL, Intravenous, Q12H, Dionisio David, MD  Past Medical History: Past Medical History:  Diagnosis Date  . Diabetes mellitus without complication ([place])   . Hypertension   . MI, old     Tobacco Use: Social History   Tobacco Use  Smoking Status Former Smoker  Smokeless Tobacco Never Used  Tobacco Comment   Quit over 40 years ago    Labs: Recent [employer] for ITP Cardiac and Pulmonary Rehab Latest Ref Rng & Units 05/16/2019   Hemoglobin A1c 4.8 - 5.6 % 5.8(H)       Exercise Target Goals: Exercise Program Goal: Individual exercise prescription set using results from initial 6 min walk test and THRR  while considering  patient's activity barriers and safety.   Exercise Prescription Goal: Initial exercise prescription builds to 30-45 minutes a day of aerobic activity, 2-3 days per week.  Home exercise guidelines will be given to patient during program as part of exercise prescription that the participant will acknowledge.   Education: Aerobic Exercise & Resistance Training: - Gives group verbal and written instruction on the various components of exercise. Focuses on aerobic and resistive training programs and the benefits of this training and how to safely progress through these programs..   Education: Exercise & Equipment Safety: - Individual verbal instruction and demonstration of equipment use and safety with use of the  equipment.   Education: Exercise Physiology & General Exercise Guidelines: - Group verbal and written instruction with models to review the exercise physiology of the cardiovascular system and associated critical values. Provides general exercise guidelines with specific guidelines to those with heart or lung disease.    Education: Flexibility, Balance, Mind/Body Relaxation: Provides group verbal/written instruction on the benefits of flexibility and balance training, including mind/body exercise modes such as yoga, pilates and tai chi.  Demonstration and skill practice provided.   Activity Barriers & Risk Stratification: Activity Barriers & Cardiac Risk Stratification - 07/02/19 1240      Activity Barriers & Cardiac Risk Stratification   Activity Barriers  Deconditioning;Left Knee Replacement;Right Knee Replacement;Balance Concerns;Joint Problems   some shoulder issues   Cardiac Risk Stratification  Moderate       6 Minute Walk: 6 Minute Walk    Row Name 07/02/19 1239         6 Minute Walk   Phase  Initial     Distance  1085 feet     Walk Time  6 minutes     # of Rest Breaks  0     MPH  2.05     METS  2.56     RPE  12     Perceived Dyspnea   1     VO2 Peak  7.21     Symptoms  Yes (comment)     Comments  SOB     Resting HR  72 bpm     Resting BP  104/66     Resting Oxygen Saturation   99 %     Exercise Oxygen Saturation  during 6 min walk  96 %     Max Ex. HR  84 bpm     Max Ex. BP  122/64     2 Minute Post BP  122/56        Oxygen Initial Assessment:   Oxygen Re-Evaluation:   Oxygen Discharge (Final Oxygen Re-Evaluation):   Initial Exercise Prescription: Initial Exercise Prescription - 07/02/19 1200      Date of Initial Exercise RX and Referring Provider   Date  07/02/19    Referring Provider  Neoma Laming MD      Treadmill   MPH  2    Grade  0    Minutes  15    METs  2.53      Recumbant Elliptical   Level  1    RPM  50    Minutes  15     METs  2      Elliptical   Level  1    Speed  2    Minutes  15      Prescription Details   Frequency (times per week)  3    Duration  Progress to 30  minutes of continuous aerobic without signs/symptoms of physical distress      Intensity   THRR 40-80% of Max Heartrate  102-131    Ratings of Perceived Exertion  11-13    Perceived Dyspnea  0-4      Progression   Progression  Continue to progress workloads to maintain intensity without signs/symptoms of physical distress.      Resistance Training   Training Prescription  Yes    Weight  3 lb    Reps  10-15       Perform Capillary Blood Glucose checks as needed.  Exercise Prescription Changes: Exercise Prescription Changes    Row Name 07/02/19 1200             Response to Exercise   Blood Pressure (Admit)  104/66       Blood Pressure (Exercise)  122/64       Blood Pressure (Exit)  122/56       Heart Rate (Admit)  72 bpm       Heart Rate (Exercise)  84 bpm       Heart Rate (Exit)  73 bpm       Oxygen Saturation (Admit)  99 %       Oxygen Saturation (Exercise)  96 %       Rating of Perceived Exertion (Exercise)  12       Perceived Dyspnea (Exercise)  1       Symptoms  SOB       Comments  walk test results          Exercise Comments:   Exercise Goals and Review: Exercise Goals    Row Name 07/02/19 1244             Exercise Goals   Increase Physical Activity  Yes       Intervention  Provide advice, education, support and counseling about physical activity/exercise needs.;Develop an individualized exercise prescription for aerobic and resistive training based on initial evaluation findings, risk stratification, comorbidities and participant's personal goals.       Expected Outcomes  Long Term: Add in home exercise to make exercise part of routine and to increase amount of physical activity.;Long Term: Exercising regularly at least 3-5 days a week.;Short Term: Attend rehab on a regular basis to increase amount of  physical activity.       Increase Strength and Stamina  Yes       Intervention  Provide advice, education, support and counseling about physical activity/exercise needs.;Develop an individualized exercise prescription for aerobic and resistive training based on initial evaluation findings, risk stratification, comorbidities and participant's personal goals.       Expected Outcomes  Short Term: Increase workloads from initial exercise prescription for resistance, speed, and METs.;Short Term: Perform resistance training exercises routinely during rehab and add in resistance training at home;Long Term: Improve cardiorespiratory fitness, muscular endurance and strength as measured by increased METs and functional capacity (6MWT)       Able to understand and use rate of perceived exertion (RPE) scale  Yes       Intervention  Provide education and explanation on how to use RPE scale       Expected Outcomes  Short Term: Able to use RPE daily in rehab to express subjective intensity level;Long Term:  Able to use RPE to guide intensity level when exercising independently       Able to understand and use Dyspnea scale  Yes       Intervention  Provide education and explanation on how to use Dyspnea scale       Expected Outcomes  Short Term: Able to use Dyspnea scale daily in rehab to express subjective sense of shortness of breath during exertion;Long Term: Able to use Dyspnea scale to guide intensity level when exercising independently       Knowledge and understanding of Target Heart Rate Range (THRR)  Yes       Intervention  Provide education and explanation of THRR including how the numbers were predicted and where they are located for reference       Expected Outcomes  Short Term: Able to state/look up THRR;Short Term: Able to use daily as guideline for intensity in rehab;Long Term: Able to use THRR to govern intensity when exercising independently       Able to check pulse independently  Yes        Intervention  Provide education and demonstration on how to check pulse in carotid and radial arteries.;Review the importance of being able to check your own pulse for safety during independent exercise       Expected Outcomes  Short Term: Able to explain why pulse checking is important during independent exercise;Long Term: Able to check pulse independently and accurately       Understanding of Exercise Prescription  Yes       Intervention  Provide education, explanation, and written materials on patient's individual exercise prescription       Expected Outcomes  Short Term: Able to explain program exercise prescription;Long Term: Able to explain home exercise prescription to exercise independently          Exercise Goals Re-Evaluation :   Discharge Exercise Prescription (Final Exercise Prescription Changes): Exercise Prescription Changes - 07/02/19 1200      Response to Exercise   Blood Pressure (Admit)  104/66    Blood Pressure (Exercise)  122/64    Blood Pressure (Exit)  122/56    Heart Rate (Admit)  72 bpm    Heart Rate (Exercise)  84 bpm    Heart Rate (Exit)  73 bpm    Oxygen Saturation (Admit)  99 %    Oxygen Saturation (Exercise)  96 %    Rating of Perceived Exertion (Exercise)  12    Perceived Dyspnea (Exercise)  1    Symptoms  SOB    Comments  walk test results       Nutrition:  Target Goals: Understanding of nutrition guidelines, daily intake of sodium <1556m, cholesterol <204m calories 30% from fat and 7% or less from saturated fats, daily to have 5 or more servings of fruits and vegetables.  Education: Controlling Sodium/Reading Food Labels -Group verbal and written material supporting the discussion of sodium use in heart healthy nutrition. Review and explanation with models, verbal and written materials for utilization of the food label.   Education: General Nutrition Guidelines/Fats and Fiber: -Group instruction provided by verbal, written material, models and  posters to present the general guidelines for heart healthy nutrition. Gives an explanation and review of dietary fats and fiber.   Biometrics: Pre Biometrics - 07/02/19 1254      Pre Biometrics   Height  5' 11.1" (1.806 m)    Weight  208 lb 14.4 oz (94.8 kg)    BMI (Calculated)  29.05    Single Leg Stand  14.6 seconds        Nutrition Therapy Plan and Nutrition Goals:   Nutrition Assessments: Nutrition Assessments - 07/02/19 1258  MEDFICTS Scores   Pre Score  47       MEDIFICTS Score Key:          ?70 Need to make dietary changes          40-70 Heart Healthy Diet         ? 40 Therapeutic Level Cholesterol Diet  Nutrition Goals Re-Evaluation:   Nutrition Goals Discharge (Final Nutrition Goals Re-Evaluation):   Psychosocial: Target Goals: Acknowledge presence or absence of significant depression and/or stress, maximize coping skills, provide positive support system. Participant is able to verbalize types and ability to use techniques and skills needed for reducing stress and depression.   Education: Depression - Provides group verbal and written instruction on the correlation between heart/lung disease and depressed mood, treatment options, and the stigmas associated with seeking treatment.   Education: Sleep Hygiene -Provides group verbal and written instruction about how sleep can affect your health.  Define sleep hygiene, discuss sleep cycles and impact of sleep habits. Review good sleep hygiene tips.     Education: Stress and Anxiety: - Provides group verbal and written instruction about the health risks of elevated stress and causes of high stress.  Discuss the correlation between heart/lung disease and anxiety and treatment options. Review healthy ways to manage with stress and anxiety.    Initial Review & Psychosocial Screening: Initial Psych Review & Screening - 07/01/19 1115      Initial Review   Current issues with  Current Sleep Concerns       Family Dynamics   Good Support System?  No   Brothers in [place], neighbors are here,   Strains  --   No family lives nearby 2 brothers in [place].   Comments  Sleep concerns are chronic.      Barriers   Psychosocial barriers to participate in program  There are no identifiable barriers or psychosocial needs.;The patient should benefit from training in stress management and relaxation.      Screening Interventions   Interventions  Encouraged to exercise    Expected Outcomes  Short Term goal: Utilizing psychosocial counselor, staff and physician to assist with identification of specific Stressors or current issues interfering with healing process. Setting desired goal for each stressor or current issue identified.;Long Term Goal: Stressors or current issues are controlled or eliminated.;Short Term goal: Identification and review with participant of any Quality of Life or Depression concerns found by scoring the questionnaire.;Long Term goal: The participant improves quality of Life and PHQ9 Scores as seen by post scores and/or verbalization of changes       Quality of Life Scores:  Quality of Life - 07/02/19 1257      Quality of Life   Select  Quality of Life      Quality of Life Scores   Health/Function Pre  26.57 %    Socioeconomic Pre  22.63 %    Psych/Spiritual Pre  29.14 %    Family Pre  17.67 %    GLOBAL Pre  25.72 %      Scores of 19 and below usually indicate a poorer quality of life in these areas.  A difference of  2-3 points is a clinically meaningful difference.  A difference of 2-3 points in the total score of the Quality of Life Index has been associated with significant improvement in overall quality of life, self-image, physical symptoms, and general health in studies assessing change in quality of life.  PHQ-9: Recent Review Flowsheet Data  Depression screen [facility] 2/9 07/02/2019   Decreased Interest 2   Down, Depressed, Hopeless 1   PHQ - 2 Score 3   Altered  sleeping 3   Tired, decreased energy 2   Change in appetite 1   Feeling bad or failure about yourself  0   Trouble concentrating 0   Moving slowly or fidgety/restless 0   Suicidal thoughts 0   PHQ-9 Score 9   Difficult doing work/chores Not difficult at all     Interpretation of Total Score  Total Score Depression Severity:  1-4 = Minimal depression, 5-9 = Mild depression, 10-14 = Moderate depression, 15-19 = Moderately severe depression, 20-27 = Severe depression   Psychosocial Evaluation and Intervention: Psychosocial Evaluation - 07/01/19 1141      Psychosocial Evaluation & Interventions   Interventions  Encouraged to exercise with the program and follow exercise prescription    Comments  Ion ahs no barriers to attending the program.  He lives alone with his 29 year old dog.  HIs wife passed away 4 years ago. He does not have any family in [place]. He did state that he could call on his neighbors if he needed help. He is looking forward to attending the progran.    Expected Outcomes  STG: Attends the progam on a consistent basis  LTG: Maintains his healthy lifestyle developed while in the program    Continue Psychosocial Services   Follow up required by staff       Psychosocial Re-Evaluation:   Psychosocial Discharge (Final Psychosocial Re-Evaluation):   Vocational Rehabilitation: Provide vocational rehab assistance to qualifying candidates.   Vocational Rehab Evaluation & Intervention: Vocational Rehab - 07/01/19 1123      Initial Vocational Rehab Evaluation & Intervention   Assessment shows need for Vocational Rehabilitation  No       Education: Education Goals: Education classes will be provided on a variety of topics geared toward better understanding of heart health and risk factor modification. Participant will state understanding/return demonstration of topics presented as noted by education test scores.  Learning Barriers/Preferences: Learning  Barriers/Preferences - 07/01/19 1122      Learning Barriers/Preferences   Learning Barriers  None    Learning Preferences  None       General Cardiac Education Topics:  AED/CPR: - Group verbal and written instruction with the use of models to demonstrate the basic use of the AED with the basic ABC's of resuscitation.   Anatomy & Physiology of the Heart: - Group verbal and written instruction and models provide basic cardiac anatomy and physiology, with the coronary electrical and arterial systems. Review of Valvular disease and Heart Failure   Cardiac Procedures: - Group verbal and written instruction to review commonly prescribed medications for heart disease. Reviews the medication, class of the drug, and side effects. Includes the steps to properly store meds and maintain the prescription regimen. (beta blockers and nitrates)   Cardiac Medications I: - Group verbal and written instruction to review commonly prescribed medications for heart disease. Reviews the medication, class of the drug, and side effects. Includes the steps to properly store meds and maintain the prescription regimen.   Cardiac Medications II: -Group verbal and written instruction to review commonly prescribed medications for heart disease. Reviews the medication, class of the drug, and side effects. (all other drug classes)    Go Sex-Intimacy & Heart Disease, Get SMART - Goal Setting: - Group verbal and written instruction through game format to discuss heart disease and the  return to sexual intimacy. Provides group verbal and written material to discuss and apply goal setting through the application of the S.M.A.R.T. Method.   Other Matters of the Heart: - Provides group verbal, written materials and models to describe Stable Angina and Peripheral Artery. Includes description of the disease process and treatment options available to the cardiac patient.   Infection Prevention: - Provides verbal and  written material to individual with discussion of infection control including proper hand washing and proper equipment cleaning during exercise session.   Falls Prevention: - Provides verbal and written material to individual with discussion of falls prevention and safety.   Other: -Provides group and verbal instruction on various topics (see comments)   Knowledge Questionnaire Score: Knowledge Questionnaire Score - 07/02/19 1258      Knowledge Questionnaire Score   Pre Score  24/26 Education Focus: Nutrition, Exercise       Core Components/Risk Factors/Patient Goals at Admission: Personal Goals and Risk Factors at Admission - 07/02/19 1259      Core Components/Risk Factors/Patient Goals on Admission    Weight Management  Yes;Weight Loss    Intervention  Weight Management: Develop a combined nutrition and exercise program designed to reach desired caloric intake, while maintaining appropriate intake of nutrient and fiber, sodium and fats, and appropriate energy expenditure required for the weight goal.;Weight Management: Provide education and appropriate resources to help participant work on and attain dietary goals.    Admit Weight  208 lb 14.4 oz (94.8 kg)    Goal Weight: Short Term  203 lb (92.1 kg)    Goal Weight: Long Term  198 lb (89.8 kg)    Expected Outcomes  Short Term: Continue to assess and modify interventions until short term weight is achieved;Long Term: Adherence to nutrition and physical activity/exercise program aimed toward attainment of established weight goal;Weight Loss: Understanding of general recommendations for a balanced deficit meal plan, which promotes 1-2 lb weight loss per week and includes a negative energy balance of [phone] kcal/d;Understanding recommendations for meals to include 15-35% energy as protein, 25-35% energy from fat, 35-60% energy from carbohydrates, less than 248m of dietary cholesterol, 20-35 gm of total fiber daily;Understanding of  distribution of calorie intake throughout the day with the consumption of 4-5 meals/snacks    Diabetes  Yes    Intervention  Provide education about signs/symptoms and action to take for hypo/hyperglycemia.;Provide education about proper nutrition, including hydration, and aerobic/resistive exercise prescription along with prescribed medications to achieve blood glucose in normal ranges: Fasting glucose 65-99 mg/dL    Expected Outcomes  Short Term: Participant verbalizes understanding of the signs/symptoms and immediate care of hyper/hypoglycemia, proper foot care and importance of medication, aerobic/resistive exercise and nutrition plan for blood glucose control.;Long Term: Attainment of HbA1C < 7%.    Hypertension  Yes    Intervention  Provide education on lifestyle modifcations including regular physical activity/exercise, weight management, moderate sodium restriction and increased consumption of fresh fruit, vegetables, and low fat dairy, alcohol moderation, and smoking cessation.;Monitor prescription use compliance.    Expected Outcomes  Short Term: Continued assessment and intervention until BP is < 140/956mHG in hypertensive participants. < 130/8072mG in hypertensive participants with diabetes, heart failure or chronic kidney disease.;Long Term: Maintenance of blood pressure at goal levels.    Lipids  Yes    Intervention  Provide education and support for participant on nutrition & aerobic/resistive exercise along with prescribed medications to achieve LDL <75m27mDL >40mg40m Expected Outcomes  Short Term: Participant states understanding of desired cholesterol values and is compliant with medications prescribed. Participant is following exercise prescription and nutrition guidelines.;Long Term: Cholesterol controlled with medications as prescribed, with individualized exercise RX and with personalized nutrition plan. Value goals: LDL < 64m, HDL > 40 mg.       Education:Diabetes -  Individual verbal and written instruction to review signs/symptoms of diabetes, desired ranges of glucose level fasting, after meals and with exercise. Acknowledge that pre and post exercise glucose checks will be done for 3 sessions at entry of program.   Education: Know Your Numbers and Risk Factors: -Group verbal and written instruction about important numbers in your health.  Discussion of what are risk factors and how they play a role in the disease process.  Review of Cholesterol, Blood Pressure, Diabetes, and BMI and the role they play in your overall health.   Core Components/Risk Factors/Patient Goals Review:    Core Components/Risk Factors/Patient Goals at Discharge (Final Review):    ITP Comments: ITP Comments    Row Name 07/01/19 1139 07/02/19 1239         ITP Comments  Virtual Orientation completed . Has EP Eval and Gym Orientation scheduled for tomorrow.  [facility]  Completed 6MWT and gym orientation.  Initial ITP created and sent for review to Dr. MEmily Filbert Medical Director.         Comments: Initial ITP

## 2019-07-06 ENCOUNTER — Encounter: Payer: Medicare HMO | Admitting: *Deleted

## 2019-07-06 ENCOUNTER — Other Ambulatory Visit: Payer: Self-pay

## 2019-07-06 DIAGNOSIS — Z951 Presence of aortocoronary bypass graft: Secondary | ICD-10-CM

## 2019-07-06 LAB — GLUCOSE, CAPILLARY
Glucose-Capillary: 100 mg/dL — ABNORMAL HIGH (ref 70–99)
Glucose-Capillary: 83 mg/dL (ref 70–99)

## 2019-07-06 NOTE — Progress Notes (Signed)
Daily Session Note  Patient Details  Name: Devin [name] [facility]. MRN: [id number] Date of Birth: 1945-09-10 Referring Provider:     Cardiac Rehab from 07/02/2019 in [facility] Cardiac and Pulmonary Rehab  Referring Provider  Neoma Laming MD      Encounter Date: 07/06/2019  Check In: Session Check In - 07/06/19 1402      Check-In   Supervising physician immediately available to respond to emergencies  See telemetry face sheet for immediately available ER MD    Location  [facility]-Cardiac & Pulmonary Rehab    Staff Present  Heath Lark, RN, BSN, Laveda Norman, BS, ACSM CEP, Exercise Physiologist;Joseph Tessie Fass RCP,RRT,BSRT    Virtual Visit  No    Medication changes reported      No    Fall or balance concerns reported     No    Warm-up and Cool-down  Performed on first and last piece of equipment    Resistance Training Performed  Yes    VAD Patient?  No    PAD/SET Patient?  No      Pain Assessment   Currently in Pain?  No/denies          Social History   Tobacco Use  Smoking Status Former Smoker  Smokeless Tobacco Never Used  Tobacco Comment   Quit over 40 years ago    Goals Met:  Exercise tolerated well Personal goals reviewed No report of cardiac concerns or symptoms  Goals Unmet:  Not Applicable  Comments: First full day of exercise!  Patient was oriented to gym and equipment including functions, settings, policies, and procedures.  Patient's individual exercise prescription and treatment plan were reviewed.  All starting workloads were established based on the results of the 6 minute walk test done at initial orientation visit.  The plan for exercise progression was also introduced and progression will be customized based on patient's performance and goals.    Dr. Emily Filbert is Medical Director for [place] and [facility].

## 2019-07-08 ENCOUNTER — Other Ambulatory Visit: Payer: Self-pay

## 2019-07-08 ENCOUNTER — Encounter: Payer: Medicare HMO | Admitting: *Deleted

## 2019-07-08 DIAGNOSIS — Z951 Presence of aortocoronary bypass graft: Secondary | ICD-10-CM | POA: Diagnosis not present

## 2019-07-08 LAB — GLUCOSE, CAPILLARY
Glucose-Capillary: 111 mg/dL — ABNORMAL HIGH (ref 70–99)
Glucose-Capillary: 71 mg/dL (ref 70–99)

## 2019-07-08 NOTE — Progress Notes (Signed)
Daily Session Note  Patient Details  Name: Devin [name]. MRN: [id number] Date of Birth: [birth year] Referring Provider:     Cardiac Rehab from 07/02/2019 in [facility] Cardiac and Pulmonary Rehab  Referring Provider  Neoma Laming MD      Encounter Date: 07/08/2019  Check In: Session Check In - 07/08/19 1342      Check-In   Supervising physician immediately available to respond to emergencies  See telemetry face sheet for immediately available ER MD    Location  [facility]Cardiac & Pulmonary Rehab    Staff Present  Renita Papa, RN BSN;Melissa Caiola RDN, LDN;Jessica [place], MA, RCEP, CCRP, CCET;Amanda Sommer, BA, ACSM CEP, Exercise Physiologist    Virtual Visit  No    Medication changes reported      No    Fall or balance concerns reported     No    Warm-up and Cool-down  Performed on first and last piece of equipment    Resistance Training Performed  Yes    VAD Patient?  No    PAD/SET Patient?  No      Pain Assessment   Currently in Pain?  No/denies          Social History   Tobacco Use  Smoking Status Former Smoker  Smokeless Tobacco Never Used  Tobacco Comment   Quit over 40 years ago    Goals Met:  Independence with exercise equipment Exercise tolerated well No report of cardiac concerns or symptoms Strength training completed today  Goals Unmet:  Not Applicable  Comments: Pt able to follow exercise prescription today without complaint.  Will continue to monitor for progression.    Dr. Emily Filbert is Medical Director for [place] and [facility].

## 2019-07-13 ENCOUNTER — Encounter: Payer: Medicare HMO | Attending: Cardiovascular Disease | Admitting: *Deleted

## 2019-07-13 ENCOUNTER — Other Ambulatory Visit: Payer: Self-pay

## 2019-07-13 DIAGNOSIS — Z951 Presence of aortocoronary bypass graft: Secondary | ICD-10-CM | POA: Diagnosis present

## 2019-07-13 DIAGNOSIS — Z87891 Personal history of nicotine dependence: Secondary | ICD-10-CM | POA: Diagnosis not present

## 2019-07-13 LAB — GLUCOSE, CAPILLARY
Glucose-Capillary: 78 mg/dL (ref 70–99)
Glucose-Capillary: 83 mg/dL (ref 70–99)

## 2019-07-13 NOTE — Progress Notes (Signed)
Incomplete Session Note  Patient Details  Name: [facility]ae [facility]. MRN: [id number] Date of Birth: 09-26-1945 Referring Provider:     Cardiac Rehab from 07/02/2019 in [facility] Cardiac and Pulmonary Rehab  Referring Provider  Adrian Blackwater MD      [name] Terald Sleeper. did not complete his rehab session.  His sugar was below the exercise requirement, after treatment it still was not high enough to exercise. He was stable and was sent home with instructions on what to eat before exercise next time.

## 2019-07-15 ENCOUNTER — Other Ambulatory Visit: Payer: Self-pay

## 2019-07-15 ENCOUNTER — Encounter: Payer: Medicare HMO | Admitting: *Deleted

## 2019-07-15 DIAGNOSIS — Z951 Presence of aortocoronary bypass graft: Secondary | ICD-10-CM | POA: Diagnosis not present

## 2019-07-15 LAB — GLUCOSE, CAPILLARY
Glucose-Capillary: 94 mg/dL (ref 70–99)
Glucose-Capillary: 100 mg/dL — ABNORMAL HIGH (ref 70–99)

## 2019-07-15 NOTE — Progress Notes (Signed)
Daily Session Note  Patient Details  Name: Devin [name] [facility]. MRN: [id number] Date of Birth: 03-Mar-1946 Referring Provider:     Cardiac Rehab from 07/02/2019 in [facility] Cardiac and Pulmonary Rehab  Referring Provider  Neoma Laming MD      Encounter Date: 07/15/2019  Check In: Session Check In - 07/15/19 1345      Check-In   Supervising physician immediately available to respond to emergencies  See telemetry face sheet for immediately available ER MD    Location  [facility]-Cardiac & Pulmonary Rehab    Staff Present  Renita Papa, RN BSN;Melissa Caiola RDN, LDN;Jessica [place], MA, RCEP, CCRP, CCET;Amanda Sommer, BA, ACSM CEP, Exercise Physiologist    Virtual Visit  No    Medication changes reported      No    Fall or balance concerns reported     No    Warm-up and Cool-down  Performed on first and last piece of equipment    Resistance Training Performed  Yes    VAD Patient?  No    PAD/SET Patient?  No      Pain Assessment   Currently in Pain?  No/denies          Social History   Tobacco Use  Smoking Status Former Smoker  Smokeless Tobacco Never Used  Tobacco Comment   Quit over 40 years ago    Goals Met:  Independence with exercise equipment Exercise tolerated well No report of cardiac concerns or symptoms Strength training completed today  Goals Unmet:  Not Applicable  Comments: Pt able to follow exercise prescription today without complaint.  Will continue to monitor for progression.    Dr. Emily Filbert is Medical Director for [place] and [facility].

## 2019-07-17 ENCOUNTER — Encounter: Payer: Medicare HMO | Admitting: *Deleted

## 2019-07-17 ENCOUNTER — Other Ambulatory Visit: Payer: Self-pay

## 2019-07-17 DIAGNOSIS — Z951 Presence of aortocoronary bypass graft: Secondary | ICD-10-CM

## 2019-07-17 NOTE — Progress Notes (Signed)
Daily Session Note  Patient Details  Name: Devin [name] [facility]. MRN: [id number] Date of Birth: August 14, 1945 Referring Provider:     Cardiac Rehab from 07/02/2019 in [facility] Cardiac and Pulmonary Rehab  Referring Provider  Neoma Laming MD      Encounter Date: 07/17/2019  Check In: Session Check In - 07/17/19 1107      Check-In   Supervising physician immediately available to respond to emergencies  See telemetry face sheet for immediately available ER MD    Location  [facility]Cardiac & Pulmonary Rehab    Staff Present  Renita Papa, RN BSN;Joseph [address] [place], [place], [place], CCRP, CCET    Virtual Visit  No    Medication changes reported      No    Fall or balance concerns reported     No    Warm-up and Cool-down  Performed on first and last piece of equipment    Resistance Training Performed  Yes    VAD Patient?  No    PAD/SET Patient?  No      Pain Assessment   Currently in Pain?  No/denies          Social History   Tobacco Use  Smoking Status Former Smoker  Smokeless Tobacco Never Used  Tobacco Comment   Quit over 40 years ago    Goals Met:  Independence with exercise equipment Exercise tolerated well No report of cardiac concerns or symptoms Strength training completed today  Goals Unmet:  Not Applicable  Comments: Pt able to follow exercise prescription today without complaint.  Will continue to monitor for progression.    Dr. Emily Filbert is Medical Director for [place] and [facility].

## 2019-07-20 ENCOUNTER — Other Ambulatory Visit: Payer: Self-pay

## 2019-07-20 ENCOUNTER — Encounter: Payer: Medicare HMO | Admitting: *Deleted

## 2019-07-20 DIAGNOSIS — Z951 Presence of aortocoronary bypass graft: Secondary | ICD-10-CM | POA: Diagnosis not present

## 2019-07-20 NOTE — Progress Notes (Signed)
Daily Session Note  Patient Details  Name: Devin [name] [facility]. MRN: [id number] Date of Birth: 03/09/1946 Referring Provider:     Cardiac Rehab from 07/02/2019 in [facility] Cardiac and Pulmonary Rehab  Referring Provider  Neoma Laming MD      Encounter Date: 07/20/2019  Check In: Session Check In - 07/20/19 1344      Check-In   Supervising physician immediately available to respond to emergencies  See telemetry face sheet for immediately available ER MD    Location  [facility]-Cardiac & Pulmonary Rehab    Staff Present  Renita Papa, RN BSN;Joseph [address] [place], [place], ACSM CEP, Exercise Physiologist;Melissa Caiola RDN, LDN    Virtual Visit  No    Medication changes reported      No    Fall or balance concerns reported     No    Warm-up and Cool-down  Performed on first and last piece of equipment    Resistance Training Performed  Yes    VAD Patient?  No    PAD/SET Patient?  No      Pain Assessment   Currently in Pain?  No/denies          Social History   Tobacco Use  Smoking Status Former Smoker  Smokeless Tobacco Never Used  Tobacco Comment   Quit over 40 years ago    Goals Met:  Independence with exercise equipment Exercise tolerated well No report of cardiac concerns or symptoms Strength training completed today  Goals Unmet:  Not Applicable  Comments: Pt able to follow exercise prescription today without complaint.  Will continue to monitor for progression.    Dr. Emily Filbert is Medical Director for [place] and [facility].

## 2019-07-22 ENCOUNTER — Encounter: Payer: Medicare HMO | Admitting: *Deleted

## 2019-07-22 ENCOUNTER — Other Ambulatory Visit: Payer: Self-pay

## 2019-07-22 DIAGNOSIS — Z951 Presence of aortocoronary bypass graft: Secondary | ICD-10-CM | POA: Diagnosis not present

## 2019-07-22 NOTE — Progress Notes (Signed)
Daily Session Note  Patient Details  Name: Devin [name] [facility]. MRN: [id number] Date of Birth: 01-23-1946 Referring Provider:     Cardiac Rehab from 07/02/2019 in [facility] Cardiac and Pulmonary Rehab  Referring Provider  Neoma Laming MD      Encounter Date: 07/22/2019  Check In: Session Check In - 07/22/19 1351      Check-In   Supervising physician immediately available to respond to emergencies  See telemetry face sheet for immediately available ER MD    Location  [facility]-Cardiac & Pulmonary Rehab    Staff Present  Hope Budds RDN, LDN;Jessica [place], MA, RCEP, CCRP, CCET;Amanda Sommer, BA, ACSM CEP, Exercise Physiologist;Talvin Christianson Sherryll Burger, RN BSN    Virtual Visit  No    Medication changes reported      No    Fall or balance concerns reported     No    Warm-up and Cool-down  Performed on first and last piece of equipment    Resistance Training Performed  Yes    VAD Patient?  No    PAD/SET Patient?  No      Pain Assessment   Currently in Pain?  No/denies          Social History   Tobacco Use  Smoking Status Former Smoker  Smokeless Tobacco Never Used  Tobacco Comment   Quit over 40 years ago    Goals Met:  Independence with exercise equipment Exercise tolerated well No report of cardiac concerns or symptoms Strength training completed today  Goals Unmet:  Not Applicable  Comments: Pt able to follow exercise prescription today without complaint.  Will continue to monitor for progression.    Dr. Emily Filbert is Medical Director for [place] and [facility].

## 2019-07-24 ENCOUNTER — Other Ambulatory Visit: Payer: Self-pay

## 2019-07-24 ENCOUNTER — Encounter: Payer: Medicare HMO | Admitting: *Deleted

## 2019-07-24 DIAGNOSIS — Z951 Presence of aortocoronary bypass graft: Secondary | ICD-10-CM

## 2019-07-24 NOTE — Progress Notes (Signed)
Daily Session Note  Patient Details  Name: [name]. MRN: [id number] Date of Birth: June 16, 1945 Referring Provider:     Cardiac Rehab from 07/02/2019 in [facility] Cardiac and Pulmonary Rehab  Referring Provider  Neoma Laming MD      Encounter Date: 07/24/2019  Check In: Session Check In - 07/24/19 1117      Check-In   Supervising physician immediately available to respond to emergencies  See telemetry face sheet for immediately available ER MD    Location  [facility]-Cardiac & Pulmonary Rehab    Staff Present  Basilia Jumbo, RN, BSN;Jessica [place], MA, RCEP, CCRP, CCET;Meredith Sherryll Burger, RN BSN;Joseph [place]    Virtual Visit  No    Medication changes reported      No    Fall or balance concerns reported     No    Warm-up and Cool-down  Performed on first and last piece of equipment    Resistance Training Performed  Yes    VAD Patient?  No    PAD/SET Patient?  No      Pain Assessment   Currently in Pain?  No/denies          Social History   Tobacco Use  Smoking Status Former Smoker  Smokeless Tobacco Never Used  Tobacco Comment   Quit over 40 years ago    Goals Met:  Independence with exercise equipment Exercise tolerated well No report of cardiac concerns or symptoms  Goals Unmet:  Not Applicable  Comments: Pt able to follow exercise prescription today without complaint.  Will continue to monitor for progression.    Dr. Emily Filbert is Medical Director for [place] and [facility].

## 2019-07-27 ENCOUNTER — Other Ambulatory Visit: Payer: Self-pay

## 2019-07-27 ENCOUNTER — Encounter: Payer: Medicare HMO | Admitting: *Deleted

## 2019-07-27 DIAGNOSIS — Z951 Presence of aortocoronary bypass graft: Secondary | ICD-10-CM | POA: Diagnosis not present

## 2019-07-27 NOTE — Progress Notes (Signed)
Daily Session Note  Patient Details  Name: Devin Nydam [facility]. MRN: [id number] Date of Birth: February 06, 1946 Referring Provider:     Cardiac Rehab from 07/02/2019 in [facility] Cardiac and Pulmonary Rehab  Referring Provider  Neoma Laming MD      Encounter Date: 07/27/2019  Check In: Session Check In - 07/27/19 1341      Check-In   Supervising physician immediately available to respond to emergencies  See telemetry face sheet for immediately available ER MD    Location  [facility]-Cardiac & Pulmonary Rehab    Staff Present  Renita Papa, RN BSN;Joseph [address] [place], [place], ACSM CEP, Exercise Physiologist    Virtual Visit  No    Medication changes reported      No    Fall or balance concerns reported     No    Warm-up and Cool-down  Performed on first and last piece of equipment    Resistance Training Performed  Yes    VAD Patient?  No    PAD/SET Patient?  No      Pain Assessment   Currently in Pain?  No/denies          Social History   Tobacco Use  Smoking Status Former Smoker  Smokeless Tobacco Never Used  Tobacco Comment   Quit over 40 years ago    Goals Met:  Independence with exercise equipment Exercise tolerated well No report of cardiac concerns or symptoms Strength training completed today  Goals Unmet:  Not Applicable  Comments: Pt able to follow exercise prescription today without complaint.  Will continue to monitor for progression.    Dr. Emily Filbert is Medical Director for [place] and [facility].

## 2019-07-29 ENCOUNTER — Encounter: Payer: Self-pay | Admitting: *Deleted

## 2019-07-29 DIAGNOSIS — Z951 Presence of aortocoronary bypass graft: Secondary | ICD-10-CM

## 2019-07-29 NOTE — Progress Notes (Signed)
Cardiac Individual Treatment Plan  Patient Details  Name: Devin [name] [facility]. MRN: [id number] Date of Birth: 18-Apr-1945 Referring Provider:     Cardiac Rehab from 07/02/2019 in [facility]iac and Pulmonary Rehab  Referring Provider  Neoma Laming MD      Initial Encounter Date:    Cardiac Rehab from 07/02/2019 in [facility] Cardiac and Pulmonary Rehab  Date  07/02/19      Visit Diagnosis: S/P CABG x 1  Patient's Home Medications on Admission:  Current Outpatient Medications:  .  acetaminophen (TYLENOL) 500 MG tablet, Take 1,000 mg by mouth at bedtime., Disp: , Rfl:  .  apixaban (ELIQUIS) 5 MG TABS tablet, Take by mouth., Disp: , Rfl:  .  ascorbic acid (VITAMIN C) 500 MG tablet, Take 500 mg by mouth daily., Disp: , Rfl:  .  aspirin 81 MG chewable tablet, Chew 81 mg by mouth daily., Disp: , Rfl:  .  atorvastatin (LIPITOR) 80 MG tablet, Take 1 tablet (80 mg total) by mouth daily., Disp: 30 tablet, Rfl: 11 .  benazepril (LOTENSIN) 20 MG tablet, Take 20 mg by mouth daily., Disp: , Rfl:  .  buPROPion (WELLBUTRIN XL) 300 MG 24 hr tablet, Take by mouth., Disp: , Rfl:  .  carvedilol (COREG) 25 MG tablet, Take 1 tablet (25 mg total) by mouth 2 (two) times daily., Disp: 60 tablet, Rfl: 11 .  Cholecalciferol 125 MCG (5000 UT) TABS, Take 5,000 Units by mouth daily., Disp: , Rfl:  .  Coenzyme Q10 100 MG capsule, Take by mouth., Disp: , Rfl:  .  EPINEPHrine (EPIPEN JR) 0.15 MG/0.3ML injection, Inject into the muscle., Disp: , Rfl:  .  esomeprazole (NEXIUM) 20 MG capsule, Take 20 mg by mouth daily., Disp: , Rfl:  .  ezetimibe (ZETIA) 10 MG tablet, Take 10 mg by mouth daily., Disp: , Rfl:  .  gabapentin (NEURONTIN) 300 MG capsule, Take 300 mg by mouth 2 (two) times daily., Disp: , Rfl:  .  ipratropium (ATROVENT HFA) 17 MCG/ACT inhaler, Inhale into the lungs., Disp: , Rfl:  .  ipratropium (ATROVENT) 0.02 % nebulizer solution, Inhale into the lungs., Disp: , Rfl:  .  isosorbide mononitrate (IMDUR) 30 MG  24 hr tablet, Take 1 tablet (30 mg total) by mouth daily., Disp: 30 tablet, Rfl: 1 .  metFORMIN (GLUCOPHAGE) 500 MG tablet, Take 500 mg by mouth 2 (two) times daily., Disp: , Rfl:  .  Multiple Vitamin (MULTI-VITAMIN) tablet, Take 1 tablet by mouth daily., Disp: , Rfl:  .  PARoxetine (PAXIL) 30 MG tablet, Take 30 mg by mouth daily., Disp: , Rfl:  No current facility-administered medications for this visit.  Facility-Administered Medications Ordered in Other Visits:  .  sodium chloride flush (NS) 0.9 % injection 3 mL, 3 mL, Intravenous, Q12H, Dionisio David, MD  Past Medical History: Past Medical History:  Diagnosis Date  . Diabetes mellitus without complication ([place])   . Hypertension   . MI, old     Tobacco Use: Social History   Tobacco Use  Smoking Status Former Smoker  Smokeless Tobacco Never Used  Tobacco Comment   Quit over 40 years ago    Labs: Recent [employer] for [employer] Cardiac and Pulmonary Rehab Latest Ref Rng & Units 05/16/2019   Hemoglobin A1c 4.8 - 5.6 % 5.8(H)       Exercise Target Goals: Exercise Program Goal: Individual exercise prescription set using results from initial 6 min walk test and THRR  while considering  patient's activity barriers and safety.   Exercise Prescription Goal: Initial exercise prescription builds to 30-45 minutes a day of aerobic activity, 2-3 days per week.  Home exercise guidelines will be given to patient during program as part of exercise prescription that the participant will acknowledge.   Education: Aerobic Exercise & Resistance Training: - Gives group verbal and written instruction on the various components of exercise. Focuses on aerobic and resistive training programs and the benefits of this training and how to safely progress through these programs..   Education: Exercise & Equipment Safety: - Individual verbal instruction and demonstration of equipment use and safety with use of the  equipment.   Education: Exercise Physiology & General Exercise Guidelines: - Group verbal and written instruction with models to review the exercise physiology of the cardiovascular system and associated critical values. Provides general exercise guidelines with specific guidelines to those with heart or lung disease.    Education: Flexibility, Balance, Mind/Body Relaxation: Provides group verbal/written instruction on the benefits of flexibility and balance training, including mind/body exercise modes such as yoga, pilates and tai chi.  Demonstration and skill practice provided.   Activity Barriers & Risk Stratification: Activity Barriers & Cardiac Risk Stratification - 07/02/19 1240      Activity Barriers & Cardiac Risk Stratification   Activity Barriers  Deconditioning;Left Knee Replacement;Right Knee Replacement;Balance Concerns;Joint Problems   some shoulder issues   Cardiac Risk Stratification  Moderate       6 Minute Walk: 6 Minute Walk    Row Name 07/02/19 1239         6 Minute Walk   Phase  Initial     Distance  1085 feet     Walk Time  6 minutes     # of Rest Breaks  0     MPH  2.05     METS  2.56     RPE  12     Perceived Dyspnea   1     VO2 Peak  7.21     Symptoms  Yes (comment)     Comments  SOB     Resting HR  72 bpm     Resting BP  104/66     Resting Oxygen Saturation   99 %     Exercise Oxygen Saturation  during 6 min walk  96 %     Max Ex. HR  84 bpm     Max Ex. BP  122/64     2 Minute Post BP  122/56        Oxygen Initial Assessment:   Oxygen Re-Evaluation:   Oxygen Discharge (Final Oxygen Re-Evaluation):   Initial Exercise Prescription: Initial Exercise Prescription - 07/02/19 1200      Date of Initial Exercise RX and Referring Provider   Date  07/02/19    Referring Provider  Neoma Laming MD      Treadmill   MPH  2    Grade  0    Minutes  15    METs  2.53      Recumbant Elliptical   Level  1    RPM  50    Minutes  15     METs  2      Elliptical   Level  1    Speed  2    Minutes  15      Prescription Details   Frequency (times per week)  3    Duration  Progress to 30  minutes of continuous aerobic without signs/symptoms of physical distress      Intensity   THRR 40-80% of Max Heartrate  102-131    Ratings of Perceived Exertion  11-13    Perceived Dyspnea  0-4      Progression   Progression  Continue to progress workloads to maintain intensity without signs/symptoms of physical distress.      Resistance Training   Training Prescription  Yes    Weight  3 lb    Reps  10-15       Perform Capillary Blood Glucose checks as needed.  Exercise Prescription Changes: Exercise Prescription Changes    Row Name 07/02/19 1200 07/14/19 1600 07/28/19 1300         Response to Exercise   Blood Pressure (Admit)  104/66  110/60  104/54     Blood Pressure (Exercise)  122/64  142/64  128/64     Blood Pressure (Exit)  122/56  126/78  112/62     Heart Rate (Admit)  72 bpm  68 bpm  70 bpm     Heart Rate (Exercise)  84 bpm  92 bpm  95 bpm     Heart Rate (Exit)  73 bpm  76 bpm  65 bpm     Oxygen Saturation (Admit)  99 %  --  --     Oxygen Saturation (Exercise)  96 %  --  --     Rating of Perceived Exertion (Exercise)  '12  16  17     '$ Perceived Dyspnea (Exercise)  1  --  --     Symptoms  SOB  none  none     Comments  walk test results  second full day of exercise  --     Duration  --  Continue with 30 min of aerobic exercise without signs/symptoms of physical distress.  Continue with 30 min of aerobic exercise without signs/symptoms of physical distress.     Intensity  --  THRR unchanged  THRR unchanged       Progression   Progression  --  Continue to progress workloads to maintain intensity without signs/symptoms of physical distress.  Continue to progress workloads to maintain intensity without signs/symptoms of physical distress.     Average METs  --  2.03  2.3       Resistance Training   Training  Prescription  --  Yes  Yes     Weight  --  4 lb  4 lb     Reps  --  10-15  10-15       Interval Training   Interval Training  --  No  No       Treadmill   MPH  --  2  2     Grade  --  0.5  0.5     Minutes  --  15  15     METs  --  2.67  2.67       Recumbant Elliptical   Level  --  1  --     Minutes  --  15  --     METs  --  1.4  --       Elliptical   Level  --  1  1     Speed  --  2  2     Minutes  --  15  15        Exercise Comments: Exercise Comments    Row Name 07/06/19 1402  Exercise Comments  First full day of exercise!  Patient was oriented to gym and equipment including functions, settings, policies, and procedures.  Patient's individual exercise prescription and treatment plan were reviewed.  All starting workloads were established based on the results of the 6 minute walk test done at initial orientation visit.  The plan for exercise progression was also introduced and progression will be customized based on patient's performance and goals.          Exercise Goals and Review: Exercise Goals    Row Name 07/02/19 1244             Exercise Goals   Increase Physical Activity  Yes       Intervention  Provide advice, education, support and counseling about physical activity/exercise needs.;Develop an individualized exercise prescription for aerobic and resistive training based on initial evaluation findings, risk stratification, comorbidities and participant's personal goals.       Expected Outcomes  Long Term: Add in home exercise to make exercise part of routine and to increase amount of physical activity.;Long Term: Exercising regularly at least 3-5 days a week.;Short Term: Attend rehab on a regular basis to increase amount of physical activity.       Increase Strength and Stamina  Yes       Intervention  Provide advice, education, support and counseling about physical activity/exercise needs.;Develop an individualized exercise prescription for aerobic  and resistive training based on initial evaluation findings, risk stratification, comorbidities and participant's personal goals.       Expected Outcomes  Short Term: Increase workloads from initial exercise prescription for resistance, speed, and METs.;Short Term: Perform resistance training exercises routinely during rehab and add in resistance training at home;Long Term: Improve cardiorespiratory fitness, muscular endurance and strength as measured by increased METs and functional capacity (6MWT)       Able to understand and use rate of perceived exertion (RPE) scale  Yes       Intervention  Provide education and explanation on how to use RPE scale       Expected Outcomes  Short Term: Able to use RPE daily in rehab to express subjective intensity level;Long Term:  Able to use RPE to guide intensity level when exercising independently       Able to understand and use Dyspnea scale  Yes       Intervention  Provide education and explanation on how to use Dyspnea scale       Expected Outcomes  Short Term: Able to use Dyspnea scale daily in rehab to express subjective sense of shortness of breath during exertion;Long Term: Able to use Dyspnea scale to guide intensity level when exercising independently       Knowledge and understanding of Target Heart Rate Range (THRR)  Yes       Intervention  Provide education and explanation of THRR including how the numbers were predicted and where they are located for reference       Expected Outcomes  Short Term: Able to state/look up THRR;Short Term: Able to use daily as guideline for intensity in rehab;Long Term: Able to use THRR to govern intensity when exercising independently       Able to check pulse independently  Yes       Intervention  Provide education and demonstration on how to check pulse in carotid and radial arteries.;Review the importance of being able to check your own pulse for safety during independent exercise       Expected Outcomes  Short Term:  Able to explain why pulse checking is important during independent exercise;Long Term: Able to check pulse independently and accurately       Understanding of Exercise Prescription  Yes       Intervention  Provide education, explanation, and written materials on patient's individual exercise prescription       Expected Outcomes  Short Term: Able to explain program exercise prescription;Long Term: Able to explain home exercise prescription to exercise independently          Exercise Goals Re-Evaluation : Exercise Goals Re-Evaluation    Row Name 07/06/19 1403 07/14/19 1558 07/28/19 1339         Exercise Goal Re-Evaluation   Exercise Goals Review  Knowledge and understanding of Target Heart Rate Range (THRR);Able to understand and use rate of perceived exertion (RPE) scale;Understanding of Exercise Prescription  Increase Physical Activity;Increase Strength and Stamina;Understanding of Exercise Prescription  Increase Physical Activity;Increase Strength and Stamina;Understanding of Exercise Prescription     Comments  Reviewed RPE and dyspnea scales, THR and program prescription with pt today.  Pt voiced understanding and was given a copy of goals to take home.  Ed is off to a good start with rehab with his first two full day sessions.  He is already on the elliptical and doing well.  He has added 0.5% grade.  We will continue to monitor his progress.  Ed is up to 6 min on the ellipitical.  Staff will have him progress slowly to 15 min.     Expected Outcomes  Short: Use RPE daily to regulate intensity. Long: Follow program prescription in THR.  Short: Continue to increase workloads  Long: Continue to follow program prescription.  Short: work on extending time on elliptical Long:  improve overall stamina        Discharge Exercise Prescription (Final Exercise Prescription Changes): Exercise Prescription Changes - 07/28/19 1300      Response to Exercise   Blood Pressure (Admit)  104/54    Blood  Pressure (Exercise)  128/64    Blood Pressure (Exit)  112/62    Heart Rate (Admit)  70 bpm    Heart Rate (Exercise)  95 bpm    Heart Rate (Exit)  65 bpm    Rating of Perceived Exertion (Exercise)  17    Symptoms  none    Duration  Continue with 30 min of aerobic exercise without signs/symptoms of physical distress.    Intensity  THRR unchanged      Progression   Progression  Continue to progress workloads to maintain intensity without signs/symptoms of physical distress.    Average METs  2.3      Resistance Training   Training Prescription  Yes    Weight  4 lb    Reps  10-15      Interval Training   Interval Training  No      Treadmill   MPH  2    Grade  0.5    Minutes  15    METs  2.67      Elliptical   Level  1    Speed  2    Minutes  15       Nutrition:  Target Goals: Understanding of nutrition guidelines, daily intake of sodium '1500mg'$ , cholesterol '200mg'$ , calories 30% from fat and 7% or less from saturated fats, daily to have 5 or more servings of fruits and vegetables.  Education: Controlling Sodium/Reading Food Labels -Group verbal and written material supporting the  discussion of sodium use in heart healthy nutrition. Review and explanation with models, verbal and written materials for utilization of the food label.   Education: General Nutrition Guidelines/Fats and Fiber: -Group instruction provided by verbal, written material, models and posters to present the general guidelines for heart healthy nutrition. Gives an explanation and review of dietary fats and fiber.   Biometrics: Pre Biometrics - 07/02/19 1254      Pre Biometrics   Height  5' 11.1" (1.806 m)    Weight  208 lb 14.4 oz (94.8 kg)    BMI (Calculated)  29.05    Single Leg Stand  14.6 seconds        Nutrition Therapy Plan and Nutrition Goals: Nutrition Therapy & Goals - 07/15/19 1309      Nutrition Therapy   Diet  Low Na, Heart Healthy, Diabetes Friendly    Protein (specify units)   75-80g    Fiber  30 grams    Whole Grain Foods  3 servings    Saturated Fats  12 max. grams    Fruits and Vegetables  5 servings/day    Sodium  1.5 grams      Personal Nutrition Goals   Nutrition Goal  ST: substantial snack before and after exercise (bean salad) and bring applesauce to rehab LT: Eat for heart and diabetes    Comments  Last a1c was 6. Doesn't eat lunch normally, discussed consistant eating for diabetes. Discussed heart healthy eating and diabetes friendly eating. Pt reports eating mostly heart healthy. Breakfast is cheerios with banana, blueberries, walnuts, and milk.      Intervention Plan   Intervention  Prescribe, educate and counsel regarding individualized specific dietary modifications aiming towards targeted core components such as weight, hypertension, lipid management, diabetes, heart failure and other comorbidities.;Nutrition handout(s) given to patient.    Expected Outcomes  Short Term Goal: Understand basic principles of dietary content, such as calories, fat, sodium, cholesterol and nutrients.;Short Term Goal: A plan has been developed with personal nutrition goals set during dietitian appointment.;Long Term Goal: Adherence to prescribed nutrition plan.       Nutrition Assessments: Nutrition Assessments - 07/02/19 1258      MEDFICTS Scores   Pre Score  47       MEDIFICTS Score Key:          ?70 Need to make dietary changes          40-70 Heart Healthy Diet         ? 40 Therapeutic Level Cholesterol Diet  Nutrition Goals Re-Evaluation:   Nutrition Goals Discharge (Final Nutrition Goals Re-Evaluation):   Psychosocial: Target Goals: Acknowledge presence or absence of significant depression and/or stress, maximize coping skills, provide positive support system. Participant is able to verbalize types and ability to use techniques and skills needed for reducing stress and depression.   Education: Depression - Provides group verbal and written instruction  on the correlation between heart/lung disease and depressed mood, treatment options, and the stigmas associated with seeking treatment.   Education: Sleep Hygiene -Provides group verbal and written instruction about how sleep can affect your health.  Define sleep hygiene, discuss sleep cycles and impact of sleep habits. Review good sleep hygiene tips.     Education: Stress and Anxiety: - Provides group verbal and written instruction about the health risks of elevated stress and causes of high stress.  Discuss the correlation between heart/lung disease and anxiety and treatment options. Review healthy ways to manage with stress and anxiety.  Initial Review & Psychosocial Screening: Initial Psych Review & Screening - 07/01/19 1115      Initial Review   Current issues with  Current Sleep Concerns      Family Dynamics   Good Support System?  No   Brothers in [place], neighbors are here,   Strains  --   No family lives nearby 2 brothers in [place].   Comments  Sleep concerns are chronic.      Barriers   Psychosocial barriers to participate in program  There are no identifiable barriers or psychosocial needs.;The patient should benefit from training in stress management and relaxation.      Screening Interventions   Interventions  Encouraged to exercise    Expected Outcomes  Short Term goal: Utilizing psychosocial counselor, staff and physician to assist with identification of specific Stressors or current issues interfering with healing process. Setting desired goal for each stressor or current issue identified.;Long Term Goal: Stressors or current issues are controlled or eliminated.;Short Term goal: Identification and review with participant of any Quality of Life or Depression concerns found by scoring the questionnaire.;Long Term goal: The participant improves quality of Life and PHQ9 Scores as seen by post scores and/or verbalization of changes       Quality of Life Scores:   Quality of Life - 07/02/19 1257      Quality of Life   Select  Quality of Life      Quality of Life Scores   Health/Function Pre  26.57 %    Socioeconomic Pre  22.63 %    Psych/Spiritual Pre  29.14 %    Family Pre  17.67 %    GLOBAL Pre  25.72 %      Scores of 19 and below usually indicate a poorer quality of life in these areas.  A difference of  2-3 points is a clinically meaningful difference.  A difference of 2-3 points in the total score of the Quality of Life Index has been associated with significant improvement in overall quality of life, self-image, physical symptoms, and general health in studies assessing change in quality of life.  PHQ-9: Recent Review Flowsheet Data    Depression screen [facility] 2/9 07/02/2019   Decreased Interest 2   Down, Depressed, Hopeless 1   PHQ - 2 Score 3   Altered sleeping 3   Tired, decreased energy 2   Change in appetite 1   Feeling bad or failure about yourself  0   Trouble concentrating 0   Moving slowly or fidgety/restless 0   Suicidal thoughts 0   PHQ-9 Score 9   Difficult doing work/chores Not difficult at all     Interpretation of Total Score  Total Score Depression Severity:  1-4 = Minimal depression, 5-9 = Mild depression, 10-14 = Moderate depression, 15-19 = Moderately severe depression, 20-27 = Severe depression   Psychosocial Evaluation and Intervention: Psychosocial Evaluation - 07/01/19 1141      Psychosocial Evaluation & Interventions   Interventions  Encouraged to exercise with the program and follow exercise prescription    Comments  Todd ahs no barriers to attending the program.  He lives alone with his 17 year old dog.  HIs wife passed away 4 years ago. He does not have any family in [place]. He did state that he could call on his neighbors if he needed help. He is looking forward to attending the progran.    Expected Outcomes  STG: Attends the progam on a consistent basis  LTG:  Maintains his healthy lifestyle developed  while in the program    Continue Psychosocial Services   Follow up required by staff       Psychosocial Re-Evaluation:   Psychosocial Discharge (Final Psychosocial Re-Evaluation):   Vocational Rehabilitation: Provide vocational rehab assistance to qualifying candidates.   Vocational Rehab Evaluation & Intervention: Vocational Rehab - 07/01/19 1123      Initial Vocational Rehab Evaluation & Intervention   Assessment shows need for Vocational Rehabilitation  No       Education: Education Goals: Education classes will be provided on a variety of topics geared toward better understanding of heart health and risk factor modification. Participant will state understanding/return demonstration of topics presented as noted by education test scores.  Learning Barriers/Preferences: Learning Barriers/Preferences - 07/01/19 1122      Learning Barriers/Preferences   Learning Barriers  None    Learning Preferences  None       General Cardiac Education Topics:  AED/CPR: - Group verbal and written instruction with the use of models to demonstrate the basic use of the AED with the basic ABC's of resuscitation.   Anatomy & Physiology of the Heart: - Group verbal and written instruction and models provide basic cardiac anatomy and physiology, with the coronary electrical and arterial systems. Review of Valvular disease and Heart Failure   Cardiac Procedures: - Group verbal and written instruction to review commonly prescribed medications for heart disease. Reviews the medication, class of the drug, and side effects. Includes the steps to properly store meds and maintain the prescription regimen. (beta blockers and nitrates)   Cardiac Medications I: - Group verbal and written instruction to review commonly prescribed medications for heart disease. Reviews the medication, class of the drug, and side effects. Includes the steps to properly store meds and maintain the prescription  regimen.   Cardiac Medications II: -Group verbal and written instruction to review commonly prescribed medications for heart disease. Reviews the medication, class of the drug, and side effects. (all other drug classes)    Go Sex-Intimacy & Heart Disease, Get SMART - Goal Setting: - Group verbal and written instruction through game format to discuss heart disease and the return to sexual intimacy. Provides group verbal and written material to discuss and apply goal setting through the application of the S.M.A.R.T. Method.   Other Matters of the Heart: - Provides group verbal, written materials and models to describe Stable Angina and Peripheral Artery. Includes description of the disease process and treatment options available to the cardiac patient.   Infection Prevention: - Provides verbal and written material to individual with discussion of infection control including proper hand washing and proper equipment cleaning during exercise session.   Falls Prevention: - Provides verbal and written material to individual with discussion of falls prevention and safety.   Other: -Provides group and verbal instruction on various topics (see comments)   Knowledge Questionnaire Score: Knowledge Questionnaire Score - 07/02/19 1258      Knowledge Questionnaire Score   Pre Score  24/26 Education Focus: Nutrition, Exercise       Core Components/Risk Factors/Patient Goals at Admission: Personal Goals and Risk Factors at Admission - 07/02/19 1259      Core Components/Risk Factors/Patient Goals on Admission    Weight Management  Yes;Weight Loss    Intervention  Weight Management: Develop a combined nutrition and exercise program designed to reach desired caloric intake, while maintaining appropriate intake of nutrient and fiber, sodium and fats, and appropriate energy expenditure required for the  weight goal.;Weight Management: Provide education and appropriate resources to help participant  work on and attain dietary goals.    Admit Weight  208 lb 14.4 oz (94.8 kg)    Goal Weight: Short Term  203 lb (92.1 kg)    Goal Weight: Long Term  198 lb (89.8 kg)    Expected Outcomes  Short Term: Continue to assess and modify interventions until short term weight is achieved;Long Term: Adherence to nutrition and physical activity/exercise program aimed toward attainment of established weight goal;Weight Loss: Understanding of general recommendations for a balanced deficit meal plan, which promotes 1-2 lb weight loss per week and includes a negative energy balance of [phone] kcal/d;Understanding recommendations for meals to include 15-35% energy as protein, 25-35% energy from fat, 35-60% energy from carbohydrates, less than '200mg'$  of dietary cholesterol, 20-35 gm of total fiber daily;Understanding of distribution of calorie intake throughout the day with the consumption of 4-5 meals/snacks    Diabetes  Yes    Intervention  Provide education about signs/symptoms and action to take for hypo/hyperglycemia.;Provide education about proper nutrition, including hydration, and aerobic/resistive exercise prescription along with prescribed medications to achieve blood glucose in normal ranges: Fasting glucose 65-99 mg/dL    Expected Outcomes  Short Term: Participant verbalizes understanding of the signs/symptoms and immediate care of hyper/hypoglycemia, proper foot care and importance of medication, aerobic/resistive exercise and nutrition plan for blood glucose control.;Long Term: Attainment of HbA1C < 7%.    Hypertension  Yes    Intervention  Provide education on lifestyle modifcations including regular physical activity/exercise, weight management, moderate sodium restriction and increased consumption of fresh fruit, vegetables, and low fat dairy, alcohol moderation, and smoking cessation.;Monitor prescription use compliance.    Expected Outcomes  Short Term: Continued assessment and intervention until BP is <  140/8m HG in hypertensive participants. < 130/877mHG in hypertensive participants with diabetes, heart failure or chronic kidney disease.;Long Term: Maintenance of blood pressure at goal levels.    Lipids  Yes    Intervention  Provide education and support for participant on nutrition & aerobic/resistive exercise along with prescribed medications to achieve LDL '70mg'$ , HDL >'40mg'$ .    Expected Outcomes  Short Term: Participant states understanding of desired cholesterol values and is compliant with medications prescribed. Participant is following exercise prescription and nutrition guidelines.;Long Term: Cholesterol controlled with medications as prescribed, with individualized exercise RX and with personalized nutrition plan. Value goals: LDL < '70mg'$ , HDL > 40 mg.       Education:Diabetes - Individual verbal and written instruction to review signs/symptoms of diabetes, desired ranges of glucose level fasting, after meals and with exercise. Acknowledge that pre and post exercise glucose checks will be done for 3 sessions at entry of program.   Education: Know Your Numbers and Risk Factors: -Group verbal and written instruction about important numbers in your health.  Discussion of what are risk factors and how they play a role in the disease process.  Review of Cholesterol, Blood Pressure, Diabetes, and BMI and the role they play in your overall health.   Core Components/Risk Factors/Patient Goals Review:    Core Components/Risk Factors/Patient Goals at Discharge (Final Review):    ITP Comments: ITP Comments    Row Name 07/01/19 1139 07/02/19 1239 07/06/19 1402 07/29/19 [id number]   ITP Comments  Virtual Orientation completed . Has EP Eval and Gym Orientation scheduled for tomorrow.  [facility]  Completed 6MWT and gym orientation.  Initial ITP  created and sent for review to Dr. Emily Filbert, Medical Director.  First full day of exercise!  Patient was  oriented to gym and equipment including functions, settings, policies, and procedures.  Patient's individual exercise prescription and treatment plan were reviewed.  All starting workloads were established based on the results of the 6 minute walk test done at initial orientation visit.  The plan for exercise progression was also introduced and progression will be customized based on patient's performance and goals.  30 Day review completed. ITP review done, changes made as directed,and approval shown by signature of  Scientist, research (life sciences).       Comments:

## 2019-07-31 ENCOUNTER — Other Ambulatory Visit: Payer: Self-pay

## 2019-07-31 ENCOUNTER — Encounter: Payer: Self-pay | Admitting: *Deleted

## 2019-07-31 ENCOUNTER — Encounter: Payer: Medicare HMO | Admitting: *Deleted

## 2019-07-31 DIAGNOSIS — Z951 Presence of aortocoronary bypass graft: Secondary | ICD-10-CM

## 2019-07-31 NOTE — Progress Notes (Signed)
Daily Session Note  Patient Details  Name: Devin [name] [facility]. MRN: [id number] Date of Birth: 1945-09-17 Referring Provider:     Cardiac Rehab from 07/02/2019 in [facility] Cardiac and Pulmonary Rehab  Referring Provider  Neoma Laming MD      Encounter Date: 07/31/2019  Check In: Session Check In - 07/31/19 1008      Check-In   Supervising physician immediately available to respond to emergencies  See telemetry face sheet for immediately available ER MD    Location  [facility]-Cardiac & Pulmonary Rehab    Staff Present  Renita Papa, RN BSN;Joseph [address] [place], [place], RCEP, CCRP, CCET    Virtual Visit  No    Medication changes reported      No    Fall or balance concerns reported     Yes    Comments  hurt shoulder (feeling better), no MD visit    Warm-up and Cool-down  Performed on first and last piece of equipment    Resistance Training Performed  Yes    VAD Patient?  No    PAD/SET Patient?  No      Pain Assessment   Currently in Pain?  No/denies          Social History   Tobacco Use  Smoking Status Former Smoker  Smokeless Tobacco Never Used  Tobacco Comment   Quit over 40 years ago    Goals Met:  Independence with exercise equipment Exercise tolerated well No report of cardiac concerns or symptoms Strength training completed today  Goals Unmet:  Not Applicable  Comments: Pt able to follow exercise prescription today without complaint.  Will continue to monitor for progression.    Dr. Emily Filbert is Medical Director for [place] and [facility].

## 2019-08-03 ENCOUNTER — Encounter: Payer: Medicare HMO | Admitting: *Deleted

## 2019-08-03 ENCOUNTER — Other Ambulatory Visit: Payer: Self-pay

## 2019-08-03 DIAGNOSIS — Z951 Presence of aortocoronary bypass graft: Secondary | ICD-10-CM | POA: Diagnosis not present

## 2019-08-03 NOTE — Progress Notes (Signed)
Daily Session Note  Patient Details  Name: Devin [name] [facility]. MRN: [id number] Date of Birth: [birth year] Referring Provider:     Cardiac Rehab from 07/02/2019 in [name] Cardiac and Pulmonary Rehab  Referring Provider  Neoma Laming MD      Encounter Date: 08/03/2019  Check In: Session Check In - 08/03/19 1341      Check-In   Supervising physician immediately available to respond to emergencies  See telemetry face sheet for immediately available ER MD    Location  [facility]-Cardiac & Pulmonary Rehab    Staff Present  Renita Papa, RN BSN;Joseph [address] [place], [place], ACSM CEP, Exercise Physiologist;Melissa Caiola RDN, LDN    Virtual Visit  No    Medication changes reported      No    Fall or balance concerns reported     No    Warm-up and Cool-down  Performed on first and last piece of equipment    Resistance Training Performed  Yes    VAD Patient?  No    PAD/SET Patient?  No      Pain Assessment   Currently in Pain?  No/denies          Social History   Tobacco Use  Smoking Status Former Smoker  Smokeless Tobacco Never Used  Tobacco Comment   Quit over 40 years ago    Goals Met:  Independence with exercise equipment Exercise tolerated well No report of cardiac concerns or symptoms Strength training completed today  Goals Unmet:  Not Applicable  Comments: Pt able to follow exercise prescription today without complaint.  Will continue to monitor for progression.    Dr. Emily Filbert is Medical Director for [place] and [facility].

## 2019-08-05 ENCOUNTER — Encounter: Payer: Medicare HMO | Admitting: *Deleted

## 2019-08-05 ENCOUNTER — Other Ambulatory Visit: Payer: Self-pay

## 2019-08-05 DIAGNOSIS — Z951 Presence of aortocoronary bypass graft: Secondary | ICD-10-CM

## 2019-08-05 NOTE — Progress Notes (Signed)
Daily Session Note  Patient Details  Name: Devin [name] [facility]. MRN: [id number] Date of Birth: 02-16-1946 Referring Provider:     Cardiac Rehab from 07/02/2019 in [facility] Cardiac and Pulmonary Rehab  Referring Provider  Neoma Laming MD      Encounter Date: 08/05/2019  Check In: Session Check In - 08/05/19 1346      Check-In   Supervising physician immediately available to respond to emergencies  See telemetry face sheet for immediately available ER MD    Location  [facility]-Cardiac & Pulmonary Rehab    Staff Present  Renita Papa, RN BSN;Melissa Caiola RDN, Rowe Pavy, BA, ACSM CEP, Exercise Physiologist    Virtual Visit  No    Medication changes reported      No    Fall or balance concerns reported     No    Warm-up and Cool-down  Performed on first and last piece of equipment    Resistance Training Performed  Yes    VAD Patient?  No    PAD/SET Patient?  No      Pain Assessment   Currently in Pain?  No/denies          Social History   Tobacco Use  Smoking Status Former Smoker  Smokeless Tobacco Never Used  Tobacco Comment   Quit over 40 years ago    Goals Met:  Independence with exercise equipment Exercise tolerated well No report of cardiac concerns or symptoms Strength training completed today  Goals Unmet:  Not Applicable  Comments: Pt able to follow exercise prescription today without complaint.  Will continue to monitor for progression. Reviewed home exercise with pt today.  Pt plans to walk and use [employer] for exercise.  Reviewed THR, pulse, RPE, sign and symptoms, NTG use, and when to call 911 or MD.  Also discussed weather considerations and indoor options.  Pt voiced understanding.    Dr. Emily Filbert is Medical Director for [place] and [facility].

## 2019-08-13 ENCOUNTER — Encounter: Payer: Self-pay | Admitting: Emergency Medicine

## 2019-08-13 ENCOUNTER — Other Ambulatory Visit: Payer: Self-pay

## 2019-08-13 ENCOUNTER — Ambulatory Visit
Admission: EM | Admit: 2019-08-13 | Discharge: 2019-08-13 | Disposition: A | Discharge status: Home / Self Care | Payer: Medicare HMO | Attending: Family Medicine | Admitting: Family Medicine

## 2019-08-13 DIAGNOSIS — R220 Localized swelling, mass and lump, head: Secondary | ICD-10-CM

## 2019-08-13 HISTORY — DX: Atherosclerotic heart disease of native coronary artery without angina pectoris: I25.10

## 2019-08-13 HISTORY — DX: Sleep apnea, unspecified: G47.30

## 2019-08-13 NOTE — ED Triage Notes (Signed)
Patient in today c/o swelling in his bottom lip and sores in his mouth x yesterday. Patient thinks he had an allergic reaction to apples. Patient states his BP dropped yesterday to 83/53. 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

## 2019-08-13 NOTE — ED Provider Notes (Signed)
[facility]    CSN: [id number] Arrival date & time: 08/13/19  1837   History   Chief Complaint Chief Complaint  Patient presents with  . bottom lip swelling   HPI  74 year old male presents with lip swelling.   Patient reports that he was seen by his cardiologist yesterday. He states that he has been feeling fatigued and weak. BP was low per his report. He states that he was given IV fluids in office and his medications were changed. 1 medication was discontinued and 1 was reduced. He is unsure what medication was discontinued. Coreg dosing was reduced by 1/2. After being seen in the office he states that he developed swelling in his lower lip.  No tongue swelling.  No shortness of breath.  He states that he is taking several doses of Benadryl.  This seems to have improved.  However, he states that he still has some swelling of his jaws bilaterally.  He is concerned to have an allergy particular to apples as he ate these prior to his symptoms.  Patient states that he feels like he has sores in his mouth.  This is his primary complaint today. No other associated symptoms. No other complaints.  Past Medical History:  Diagnosis Date  . CAD (coronary artery disease)   . Diabetes mellitus without complication (HCC)   . Hypertension   . MI, old   . Sleep apnea     Patient Active Problem List   Diagnosis Date Noted  . Chest pain 05/21/2019  . Unstable angina (HCC)   . Ischemic chest pain (HCC) 05/16/2019  . CAD (coronary artery disease) 05/16/2019  . Type 2 diabetes mellitus without complication (HCC) 05/16/2019  . Essential hypertension 05/16/2019  . Depression 05/16/2019  . GERD (gastroesophageal reflux disease) 05/16/2019   Past Surgical History:  Procedure Laterality Date  . APPENDECTOMY    . BREAST SURGERY     benign mass  . CORONARY ANGIOPLASTY WITH STENT PLACEMENT    . LEFT HEART CATH AND CORS/GRAFTS ANGIOGRAPHY N/A 05/21/2019   Procedure: LEFT HEART CATH AND  CORONARY ANGIOGRAPHY;  Surgeon: Laurier Nancy, MD;  Location: [facility] INVASIVE CV LAB;  Service: Cardiovascular;  Laterality: N/A;  . REPLACEMENT TOTAL KNEE BILATERAL      Home Medications    Prior to Admission medications   Medication Sig Start Date End Date Taking? Authorizing Provider  acetaminophen (TYLENOL) 500 MG tablet Take 1,000 mg by mouth at bedtime.   Yes [provider]  apixaban (ELIQUIS) 5 MG TABS tablet Take by mouth. 06/05/19 08/22/19 Yes [provider]  ascorbic acid (VITAMIN C) 500 MG tablet Take 500 mg by mouth daily.   Yes [provider]  atorvastatin (LIPITOR) 80 MG tablet Take 1 tablet (80 mg total) by mouth daily. 05/16/19 05/15/20 Yes Agbata, Tochukwu, MD  buPROPion (WELLBUTRIN XL) 300 MG 24 hr tablet Take by mouth.   Yes [provider]  carvedilol (COREG) 25 MG tablet Take 1 tablet (25 mg total) by mouth 2 (two) times daily. 05/16/19 05/15/20 Yes Agbata, Tochukwu, MD  Cholecalciferol 125 MCG (5000 UT) TABS Take 5,000 Units by mouth daily.   Yes [provider]  Coenzyme Q10 100 MG capsule Take by mouth.   Yes [provider]  EPINEPHrine (EPIPEN JR) 0.15 MG/0.3ML injection Inject into the muscle.   Yes [provider]  esomeprazole (NEXIUM) 20 MG capsule Take 20 mg by mouth daily.   Yes [provider]  ezetimibe (ZETIA) 10 MG tablet Take 10 mg by mouth daily. 04/06/19  Yes [provider]  gabapentin (NEURONTIN) 300 MG capsule Take 300 mg by mouth 2 (two) times daily. 05/01/19  Yes [provider]  ipratropium (ATROVENT HFA) 17 MCG/ACT inhaler Inhale into the lungs.   Yes [provider]  ipratropium (ATROVENT) 0.02 % nebulizer solution Inhale into the lungs. 06/05/19  Yes [provider]  isosorbide mononitrate (IMDUR) 30 MG 24 hr tablet Take 1 tablet (30 mg total) by mouth daily. 05/16/19  Yes Agbata, Tochukwu, MD  metFORMIN (GLUCOPHAGE) 500 MG tablet Take 500 mg by mouth  2 (two) times daily. 02/17/19  Yes [provider]  Multiple Vitamin (MULTI-VITAMIN) tablet Take 1 tablet by mouth daily. 01/06/10  Yes [provider]  PARoxetine (PAXIL) 30 MG tablet Take 30 mg by mouth daily. 03/19/19  Yes [provider]  benazepril (LOTENSIN) 20 MG tablet Take 20 mg by mouth daily. 03/19/19 08/13/19 Yes [provider]    Family History Family History  Problem Relation Age of Onset  . Osteoarthritis Mother   . Other Father 45       MVA    Social History Social History   Tobacco Use  . Smoking status: Former Research scientist (life sciences)  . Smokeless tobacco: Never Used  . Tobacco comment: Quit over 40 years ago  Substance Use Topics  . Alcohol use: Yes    Comment: social  . Drug use: Never     Allergies   Yellow jacket venom [bee venom]   Review of Systems Review of Systems  Constitutional: Positive for fatigue.  HENT:       Lip swelling; Jaw swelling.  Respiratory: Positive for shortness of breath.    Physical Exam Triage Vital Signs ED Triage Vitals  Enc Vitals Group     BP 08/13/19 1847 120/68     Pulse Rate 08/13/19 1847 69     Resp 08/13/19 1847 18     Temp 08/13/19 1847 98 F (36.7 C)     Temp Source 08/13/19 1847 Oral     SpO2 08/13/19 1847 98 %     Weight 08/13/19 1848 207 lb (93.9 kg)     Height 08/13/19 1848 6' (1.829 m)     Head Circumference --      Peak Flow --      Pain Score 08/13/19 1848 5     Pain Loc --      Pain Edu? --      Excl. in [place]? --    Updated Vital Signs BP 120/68 (BP Location: Left Arm)   Pulse 69   Temp 98 F (36.7 C) (Oral)   Resp 18   Ht 6' (1.829 m)   Wt 93.9 kg   SpO2 98%   BMI 28.07 kg/m   Visual Acuity Right Eye Distance:   Left Eye Distance:   Bilateral Distance:    Right Eye Near:   Left Eye Near:    Bilateral Near:     Physical Exam Constitutional:      General: He is not in acute distress.    Appearance: Normal appearance. He is not ill-appearing.  HENT:     Head:  Normocephalic and atraumatic.     Mouth/Throat:     Comments: No appreciable swelling of the lips.  No oral lesions.  No appreciable swelling of the jaws. Eyes:     General:        Right eye: No discharge.  Left eye: No discharge.     Conjunctiva/sclera: Conjunctivae normal.  Cardiovascular:     Rate and Rhythm: Normal rate and regular rhythm.     Heart sounds: No murmur.  Pulmonary:     Effort: Pulmonary effort is normal. No respiratory distress.     Breath sounds: No wheezing, rhonchi or rales.  Neurological:     Mental Status: He is alert.  Psychiatric:        Mood and Affect: Mood normal.        Behavior: Behavior normal.    UC Treatments / Results  Labs (all labs ordered are listed, but only abnormal results are displayed) Labs Reviewed - No data to display  EKG   Radiology No results found.  Procedures Procedures (including critical care time)  Medications Ordered in UC Medications - No data to display  Initial Impression / Assessment and Plan / UC Course  I have reviewed the triage vital signs and the nursing notes.  Pertinent labs & imaging results that were available during my care of the patient were reviewed by me and considered in my medical decision making (see chart for details).    74 year old male presents with lip swelling.  Exam unremarkable today.  Given reports of lip swelling, I advised patient to stop benazepril given associated with angioedema.  Patient to call  cardiology in the morning.   Final Clinical Impressions(s) / UC Diagnoses   Final diagnoses:  Lip swelling     Discharge Instructions     Stop the Benazepril.  Stay hydrated.   Call Dr. Welton Flakes in the AM and inform him that you have had lip swelling and that the benazepril was stopped.  He will need to follow-up with you regarding your blood pressure.  Take care  Dr. Adriana Simas    ED Prescriptions    None     PDMP not reviewed this encounter.   Tommie Sams,  [place] 08/13/19 2344

## 2019-08-13 NOTE — Discharge Instructions (Signed)
Stop the Benazepril.  Stay hydrated.   Call Dr. Welton Flakes in the AM and inform him that you have had lip swelling and that the benazepril was stopped.  He will need to follow-up with you regarding your blood pressure.  Take care  Dr. Adriana Simas

## 2019-08-17 ENCOUNTER — Encounter: Payer: Medicare HMO | Attending: Cardiovascular Disease

## 2019-08-17 ENCOUNTER — Other Ambulatory Visit: Payer: Self-pay

## 2019-08-17 DIAGNOSIS — Z87891 Personal history of nicotine dependence: Secondary | ICD-10-CM | POA: Insufficient documentation

## 2019-08-17 DIAGNOSIS — Z951 Presence of aortocoronary bypass graft: Secondary | ICD-10-CM | POA: Insufficient documentation

## 2019-08-17 NOTE — Progress Notes (Signed)
Daily Session Note  Patient Details  Name: Rylei Codispoti [facility]. MRN: [id number] Date of Birth: [birth year] Referring Provider:     Cardiac Rehab from 07/02/2019 in [facility] Cardiac and Pulmonary Rehab  Referring Provider  Neoma Laming MD      Encounter Date: 08/17/2019  Check In: Session Check In - 08/17/19 1431      Check-In   Supervising physician immediately available to respond to emergencies  See telemetry face sheet for immediately available ER MD    Location  [facility]-Cardiac & Pulmonary Rehab    Staff Present  Basilia Jumbo, RN, BSN;Melissa Caiola RDN, LDN;Jessica [place], MA, RCEP, CCRP, [place], BS, ACSM CEP, Exercise Physiologist;Joseph Lou Miner, [place] Exercise Physiologist    Virtual Visit  No    Medication changes reported      No    Fall or balance concerns reported     No    Tobacco Cessation  No Change    Warm-up and Cool-down  Performed on first and last piece of equipment    Resistance Training Performed  Yes    VAD Patient?  No    PAD/SET Patient?  No      Pain Assessment   Currently in Pain?  No/denies          Social History   Tobacco Use  Smoking Status Former Smoker  Smokeless Tobacco Never Used  Tobacco Comment   Quit over 40 years ago    Goals Met:  Independence with exercise equipment Exercise tolerated well No report of cardiac concerns or symptoms  Goals Unmet:  Not Applicable  Comments: Pt able to follow exercise prescription today without complaint.  Will continue to monitor for progression.   Dr. Emily Filbert is Medical Director for [place] and [facility].

## 2019-08-19 ENCOUNTER — Encounter: Payer: Medicare HMO | Admitting: *Deleted

## 2019-08-19 ENCOUNTER — Other Ambulatory Visit: Payer: Self-pay

## 2019-08-19 DIAGNOSIS — Z951 Presence of aortocoronary bypass graft: Secondary | ICD-10-CM | POA: Diagnosis not present

## 2019-08-19 NOTE — Progress Notes (Signed)
Daily Session Note  Patient Details  Name: Devin [name] [facility]. MRN: [id number] Date of Birth: 1945/04/21 Referring Provider:     Cardiac Rehab from 07/02/2019 in [facility] Cardiac and Pulmonary Rehab  Referring Provider  Neoma Laming MD      Encounter Date: 08/19/2019  Check In: Session Check In - 08/19/19 1403      Check-In   Supervising physician immediately available to respond to emergencies  See telemetry face sheet for immediately available ER MD    Location  [facility]-Cardiac & Pulmonary Rehab    Staff Present  Heath Lark, RN, BSN, CCRP;Laureen Owens Shark, BS, RRT, CPFT;Melissa Caiola RDN, LDN    Virtual Visit  No    Medication changes reported      No    Fall or balance concerns reported     No    Warm-up and Cool-down  Performed on first and last piece of equipment    Resistance Training Performed  Yes    VAD Patient?  No    PAD/SET Patient?  No      Pain Assessment   Currently in Pain?  No/denies          Social History   Tobacco Use  Smoking Status Former Smoker  Smokeless Tobacco Never Used  Tobacco Comment   Quit over 40 years ago    Goals Met:  Independence with exercise equipment Exercise tolerated well No report of cardiac concerns or symptoms  Goals Unmet:  Not Applicable  Comments: Pt able to follow exercise prescription today without complaint.  Will continue to monitor for progression.    Dr. Emily Filbert is Medical Director for [place] and [facility].

## 2019-08-21 ENCOUNTER — Encounter: Payer: Medicare HMO | Admitting: *Deleted

## 2019-08-21 ENCOUNTER — Other Ambulatory Visit: Payer: Self-pay

## 2019-08-21 DIAGNOSIS — Z951 Presence of aortocoronary bypass graft: Secondary | ICD-10-CM | POA: Diagnosis not present

## 2019-08-21 NOTE — Progress Notes (Signed)
Daily Session Note  Patient Details  Name: Devin [name]. MRN: [id number] Date of Birth: [birth year] Referring Provider:     Cardiac Rehab from 07/02/2019 in [facility] Cardiac and Pulmonary Rehab  Referring Provider Neoma Laming MD      Encounter Date: 08/21/2019  Check In:  Session Check In - 08/21/19 1052      Check-In   Supervising physician immediately available to respond to emergencies See telemetry face sheet for immediately available ER MD    Location [facility]-Cardiac & Pulmonary Rehab    Staff Present Heath Lark, RN, BSN, CCRP;Jessica [place], MA, RCEP, CCRP, CCET;Joseph K[place]wick RCP,RRT,BSRT    Virtual Visit No    Medication changes reported     No    Fall or balance concerns reported    No    Warm-up and Cool-down Performed on first and last piece of equipment    Resistance Training Performed Yes    VAD Patient? No    PAD/SET Patient? No      Pain Assessment   Currently in Pain? No/denies              Social History   Tobacco Use  Smoking Status Former Smoker  Smokeless Tobacco Never Used  Tobacco Comment   Quit over 40 years ago    Goals Met:  Independence with exercise equipment Exercise tolerated well No report of cardiac concerns or symptoms  Goals Unmet:  Not Applicable  Comments: Pt able to follow exercise prescription today without complaint.  Will continue to monitor for progression.    Dr. Emily Filbert is Medical Director for [place] and [facility].

## 2019-08-24 ENCOUNTER — Encounter: Payer: Medicare HMO | Admitting: *Deleted

## 2019-08-24 ENCOUNTER — Other Ambulatory Visit: Payer: Self-pay

## 2019-08-24 DIAGNOSIS — Z951 Presence of aortocoronary bypass graft: Secondary | ICD-10-CM | POA: Diagnosis not present

## 2019-08-24 NOTE — Progress Notes (Signed)
Daily Session Note  Patient Details  Name: Devin [name]. MRN: [id number] Date of Birth: 08-26-1945 Referring Provider:     Cardiac Rehab from 07/02/2019 in [facility]  Referring Provider Neoma Laming MD      Encounter Date: 08/24/2019  Check In:  Session Check In - 08/24/19 1324      Check-In   Supervising physician immediately available to respond to emergencies See telemetry face sheet for immediately available ER MD    Location [facility]-Cardiac [facility] Pulmonary Rehab    Staff Present Renita Papa, RN BSN;Joseph Hood RCP,RRT,BSRT;Melissa [place] RDN, LDN    Virtual Visit No    Medication changes reported     No    Fall or balance concerns reported    No    Warm-up and Cool-down Performed on first and last piece of equipment    Resistance Training Performed Yes    VAD Patient? No    PAD/SET Patient? No      Pain Assessment   Currently in Pain? No/denies              Social History   Tobacco Use  Smoking Status Former Smoker  Smokeless Tobacco Never Used  Tobacco Comment   Quit over 40 years ago    Goals Met:  Independence with exercise equipment Exercise tolerated well No report of cardiac concerns or symptoms Strength training completed today  Goals Unmet:  Not Applicable  Comments: Pt able to follow exercise prescription today without complaint.  Will continue to monitor for progression.    Dr. Emily Filbert is Medical Director for [place] and [facility].

## 2019-08-26 ENCOUNTER — Other Ambulatory Visit: Payer: Self-pay

## 2019-08-26 ENCOUNTER — Encounter: Payer: Medicare HMO | Admitting: *Deleted

## 2019-08-26 ENCOUNTER — Encounter: Payer: Self-pay | Admitting: *Deleted

## 2019-08-26 DIAGNOSIS — Z951 Presence of aortocoronary bypass graft: Secondary | ICD-10-CM

## 2019-08-26 NOTE — Progress Notes (Signed)
Cardiac Individual Treatment Plan  Patient Details  Name: Devin [name] [facility]. MRN: [id number] Date of Birth: 1945-05-05 Referring Provider:     Cardiac Rehab from 07/02/2019 in [facility] Cardiac and Pulmonary Rehab  Referring Provider Neoma Laming MD      Initial Encounter Date:    Cardiac Rehab from 07/02/2019 in [facility] Cardiac and Pulmonary Rehab  Date 07/02/19      Visit Diagnosis: S/P CABG x 1  Patient's Home Medications on Admission:  Current Outpatient Medications:  .  acetaminophen (TYLENOL) 500 MG tablet, Take 1,000 mg by mouth at bedtime., Disp: , Rfl:  .  apixaban (ELIQUIS) 5 MG TABS tablet, Take by mouth., Disp: , Rfl:  .  ascorbic acid (VITAMIN C) 500 MG tablet, Take 500 mg by mouth daily., Disp: , Rfl:  .  atorvastatin (LIPITOR) 80 MG tablet, Take 1 tablet (80 mg total) by mouth daily., Disp: 30 tablet, Rfl: 11 .  buPROPion (WELLBUTRIN XL) 300 MG 24 hr tablet, Take by mouth., Disp: , Rfl:  .  carvedilol (COREG) 25 MG tablet, Take 1 tablet (25 mg total) by mouth 2 (two) times daily., Disp: 60 tablet, Rfl: 11 .  Cholecalciferol 125 MCG (5000 UT) TABS, Take 5,000 Units by mouth daily., Disp: , Rfl:  .  Coenzyme Q10 100 MG capsule, Take by mouth., Disp: , Rfl:  .  EPINEPHrine (EPIPEN JR) 0.15 MG/0.3ML injection, Inject into the muscle., Disp: , Rfl:  .  esomeprazole (NEXIUM) 20 MG capsule, Take 20 mg by mouth daily., Disp: , Rfl:  .  ezetimibe (ZETIA) 10 MG tablet, Take 10 mg by mouth daily., Disp: , Rfl:  .  gabapentin (NEURONTIN) 300 MG capsule, Take 300 mg by mouth 2 (two) times daily., Disp: , Rfl:  .  ipratropium (ATROVENT HFA) 17 MCG/ACT inhaler, Inhale into the lungs., Disp: , Rfl:  .  ipratropium (ATROVENT) 0.02 % nebulizer solution, Inhale into the lungs., Disp: , Rfl:  .  isosorbide mononitrate (IMDUR) 30 MG 24 hr tablet, Take 1 tablet (30 mg total) by mouth daily., Disp: 30 tablet, Rfl: 1 .  metFORMIN (GLUCOPHAGE) 500 MG tablet, Take 500 mg by mouth 2 (two) times  daily., Disp: , Rfl:  .  Multiple Vitamin (MULTI-VITAMIN) tablet, Take 1 tablet by mouth daily., Disp: , Rfl:  .  PARoxetine (PAXIL) 30 MG tablet, Take 30 mg by mouth daily., Disp: , Rfl:  No current facility-administered medications for this visit.  Facility-Administered Medications Ordered in Other Visits:  .  sodium chloride flush (NS) 0.9 % injection 3 mL, 3 mL, Intravenous, Q12H, Dionisio David, MD  Past Medical History: Past Medical History:  Diagnosis Date  . CAD (coronary artery disease)   . Diabetes mellitus without complication ([place])   . Hypertension   . MI, old   . 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   Exercise Prescription Goal: Initial exercise prescription builds to 30-45 minutes a day of aerobic  activity, 2-3 days per week.  Home exercise guidelines will be given to patient during program as part of exercise prescription that the participant will acknowledge.   Education: Aerobic Exercise & Resistance Training: - Gives group verbal and written instruction on the various components of exercise. Focuses on aerobic and resistive training programs and the benefits of this training and how to safely progress through these programs..   Education: Exercise & Equipment Safety: - Individual verbal instruction and demonstration of equipment use and safety with use of the equipment.   Education: Exercise Physiology & General Exercise Guidelines: - Group verbal and written instruction with  models to review the exercise physiology of the cardiovascular system and associated critical values. Provides general exercise guidelines with specific guidelines to those with heart or lung disease.    Education: Flexibility, Balance, Mind/Body Relaxation: Provides group verbal/written instruction on the benefits of flexibility and balance training, including mind/body exercise modes such as yoga, pilates and tai chi.  Demonstration and skill practice provided.   Activity Barriers & Risk Stratification:  Activity Barriers & Cardiac Risk Stratification - 07/02/19 1240      Activity Barriers & Cardiac Risk Stratification   Activity Barriers Deconditioning;Left Knee Replacement;Right Knee Replacement;Balance Concerns;Joint Problems   some shoulder issues   Cardiac Risk Stratification Moderate           6 Minute Walk:  6 Minute Walk    Row Name 07/02/19 1239         6 Minute Walk   Phase Initial     Distance 1085 feet     Walk Time 6 minutes     # of Rest Breaks 0     MPH 2.05     METS 2.56     RPE 12     Perceived Dyspnea  1     VO2 Peak 7.21     Symptoms Yes (comment)     Comments SOB     Resting HR 72 bpm     Resting BP 104/66     Resting Oxygen Saturation  99 %     Exercise Oxygen Saturation  during 6 min walk 96 %     Max Ex. HR 84 bpm     Max Ex. BP 122/64     2 Minute Post BP 122/56            Oxygen Initial Assessment:   Oxygen Re-Evaluation:   Oxygen Discharge (Final Oxygen Re-Evaluation):   Initial Exercise Prescription:  Initial Exercise Prescription - 07/02/19 1200      Date of Initial Exercise RX and Referring Provider   Date 07/02/19    Referring Provider Neoma Laming MD      Treadmill   MPH 2    Grade 0    Minutes 15    METs 2.53      Recumbant Elliptical   Level 1    RPM 50    Minutes 15    METs 2      Elliptical   Level 1    Speed 2    Minutes 15      Prescription Details   Frequency (times per week) 3    Duration  Progress to 30 minutes of continuous aerobic without signs/symptoms of physical distress      Intensity   THRR 40-80% of Max Heartrate 102-131    Ratings of Perceived Exertion 11-13    Perceived Dyspnea 0-4      Progression   Progression Continue to  progress workloads to maintain intensity without signs/symptoms of physical distress.      Resistance Training   Training Prescription Yes    Weight 3 lb    Reps 10-15           Perform Capillary Blood Glucose checks as needed.  Exercise Prescription Changes:  Exercise Prescription Changes    Row Name 07/02/19 1200 07/14/19 1600 07/28/19 1300 08/11/19 1500 08/25/19 1400     Response to Exercise   Blood Pressure (Admit) 104/66 110/60 104/54 100/64 122/66   Blood Pressure (Exercise) 122/64 142/64 128/64 138/54 138/78   Blood Pressure (Exit) 122/56 126/78 112/62 94/64 122/64   Heart Rate (Admit) 72 bpm 68 bpm 70 bpm 68 bpm 84 bpm   Heart Rate (Exercise) 84 bpm 92 bpm 95 bpm 89 bpm 129 bpm   Heart Rate (Exit) 73 bpm 76 bpm 65 bpm 64 bpm 71 bpm   Oxygen Saturation (Admit) 99 % -- -- -- --   Oxygen Saturation (Exercise) 96 % -- -- -- --   Rating of Perceived Exertion (Exercise) '12 16 17 17 14   '$ Perceived Dyspnea (Exercise) 1 -- -- -- --   Symptoms SOB none none none none   Comments walk test results second full day of exercise -- -- --   Duration -- Continue with 30 min of aerobic exercise without signs/symptoms of physical distress. Continue with 30 min of aerobic exercise without signs/symptoms of physical distress. Continue with 30 min of aerobic exercise without signs/symptoms of physical distress. Continue with 30 min of aerobic exercise without signs/symptoms of physical distress.   Intensity -- THRR unchanged THRR unchanged THRR unchanged THRR unchanged     Progression   Progression -- Continue to progress workloads to maintain intensity without signs/symptoms of physical distress. Continue to progress workloads to maintain  intensity without signs/symptoms of physical distress. Continue to progress workloads to maintain intensity without signs/symptoms of physical distress. Continue to progress workloads to maintain intensity without signs/symptoms of physical distress.   Average METs -- 2.03 2.3 2.42 3.85     Resistance Training   Training Prescription -- Yes Yes Yes Yes   Weight -- 4 lb 4 lb 4 lb 5 lb   Reps -- 10-15 10-15 10-15 10-15     Interval Training   Interval Training -- No No No No     Treadmill   MPH -- '2 2 2 '$ --   Grade -- 0.5 0.5 0.5 --   Minutes -- '15 15 15 '$ --   METs -- 2.67 2.67 2.67 --     Recumbant Bike   Level -- -- -- 4 --   Minutes -- -- -- 15 --   METs -- -- -- 2.3 --     NuStep   Level -- -- -- 4 4   SPM -- -- -- -- 80   Minutes -- -- -- 15 15   METs -- -- -- 2.3 4     Recumbant Elliptical   Level -- 1 -- -- --   Minutes -- 15 -- -- --   METs -- 1.4 -- -- --     Elliptical   Level -- 1 1 -- --   Speed -- 2 2 -- --   Minutes -- 15 15 -- --     REL-XR   Level -- -- -- 1 --   Minutes -- -- -- 15 --     T5 Nustep   Level -- -- -- 3 --  Minutes -- -- -- 15 --   METs -- -- -- 2 --     Biostep-RELP   Level -- -- -- -- 4   SPM -- -- -- -- 50   Minutes -- -- -- -- 15   METs -- -- -- -- 3     Home Exercise Plan   Plans to continue exercise at -- -- -- Home (comment)  walking Home (comment)  walking   Frequency -- -- -- Add 2 additional days to program exercise sessions. Add 2 additional days to program exercise sessions.   Initial Home Exercises Provided -- -- -- 08/03/19 08/03/19          Exercise Comments:  Exercise Comments    Row Name 07/06/19 1402           Exercise Comments First full day of exercise!  Patient was oriented to gym and equipment including functions, settings, policies, and procedures.  Patient's individual exercise prescription and treatment plan were reviewed.  All starting workloads were established based on the results of the 6  minute walk test done at initial orientation visit.  The plan for exercise progression was also introduced and progression will be customized based on patient's performance and goals.              Exercise Goals and Review:  Exercise Goals    Row Name 07/02/19 1244             Exercise Goals   Increase Physical Activity Yes       Intervention Provide advice, education, support and counseling about physical activity/exercise needs.;Develop an individualized exercise prescription for aerobic and resistive training based on initial evaluation findings, risk stratification, comorbidities and participant's personal goals.       Expected Outcomes Long Term: Add in home exercise to make exercise part of routine and to increase amount of physical activity.;Long Term: Exercising regularly at least 3-5 days a week.;Short Term: Attend rehab on a regular basis to increase amount of physical activity.       Increase Strength and Stamina Yes       Intervention Provide advice, education, support and counseling about physical activity/exercise needs.;Develop an individualized exercise prescription for aerobic and resistive training based on initial evaluation findings, risk stratification, comorbidities and participant's personal goals.       Expected Outcomes Short Term: Increase workloads from initial exercise prescription for resistance, speed, and METs.;Short Term: Perform resistance training exercises routinely during rehab and add in resistance training at home;Long Term: Improve cardiorespiratory fitness, muscular endurance and strength as measured by increased METs and functional capacity ( )       Able to understand and use rate of perceived exertion (RPE) scale Yes       Intervention Provide education and explanation on how to use RPE scale       Expected Outcomes Short Term: Able to use RPE daily in rehab to express subjective intensity level;Long Term:  Able to use RPE to guide intensity level  when exercising independently       Able to understand and use Dyspnea scale Yes       Intervention Provide education and explanation on how to use Dyspnea scale       Expected Outcomes Short Term: Able to use Dyspnea scale daily in rehab to express subjective sense of shortness of breath during exertion;Long Term: Able to use Dyspnea scale to guide intensity level when exercising independently       Knowledge and  understanding of Target Heart Rate Range (THRR) Yes       Intervention Provide education and explanation of THRR including how the numbers were predicted and where they are located for reference       Expected Outcomes Short Term: Able to state/look up THRR;Short Term: Able to use daily as guideline for intensity in rehab;Long Term: Able to use THRR to govern intensity when exercising independently       Able to check pulse independently Yes       Intervention Provide education and demonstration on how to check pulse in carotid and radial arteries.;Review the importance of being able to check your own pulse for safety during independent exercise       Expected Outcomes Short Term: Able to explain why pulse checking is important during independent exercise;Long Term: Able to check pulse independently and accurately       Understanding of Exercise Prescription Yes       Intervention Provide education, explanation, and written materials on patient's individual exercise prescription       Expected Outcomes Short Term: Able to explain program exercise prescription;Long Term: Able to explain home exercise prescription to exercise independently              Exercise Goals Re-Evaluation :  Exercise Goals Re-Evaluation    Row Name 07/06/19 1403 07/14/19 1558 07/28/19 1339 08/05/19 1409 08/11/19 1543     Exercise Goal Re-Evaluation   Exercise Goals Review Knowledge and understanding of Target Heart Rate Range (THRR);Able to understand and use rate of perceived exertion (RPE)  scale;Understanding of Exercise Prescription Increase Physical Activity;Increase Strength and Stamina;Understanding of Exercise Prescription Increase Physical Activity;Increase Strength and Stamina;Understanding of Exercise Prescription Increase Physical Activity;Increase Strength and Stamina;Understanding of Exercise Prescription;Able to understand and use rate of perceived exertion (RPE) scale;Knowledge and understanding of Target Heart Rate Range (THRR);Able to check pulse independently Increase Physical Activity;Increase Strength and Stamina;Understanding of Exercise Prescription   Comments Reviewed RPE and dyspnea scales, THR and program prescription with pt today.  Pt voiced understanding and was given a copy of goals to take home. Devin [name] is off to a good start with rehab with his first two full day sessions.  He is already on the elliptical and doing well.  He has added 0.5% grade.  We will continue to monitor his progress. Devin [name] is up to 6 min on the ellipitical.  Staff will have him progress slowly to 15 min. Reviewed home exercise with pt today.  Pt plans to walk at home and use [employer] for exercise.  Reviewed THR, pulse, RPE, sign and symptoms, NTG use, and when to call 911 or MD.  Also discussed weather considerations and indoor options.  Pt voiced understanding. Devin [name] has been doing well in rehab.  He is up to level 4 on the NuStep and has asked to switch off the elliptical as it is too hard for him.  We have rearranged his equipment.  We will continue to montior his progress.   Expected Outcomes Short: Use RPE daily to regulate intensity. Long: Follow program prescription in THR. Short: Continue to increase workloads  Long: Continue to follow program prescription. Short: work on extending time on elliptical Long:  improve overall stamina Short: add one day in adition to program sessions Long: exercise on his own Short: Try out new equipment rotation  Long: Continue to improve stamina   Row Name 08/19/19 1345  08/25/19 1456           Exercise  Goal Re-Evaluation   Exercise Goals Review Increase Physical Activity;Increase Strength and Stamina;Understanding of Exercise Prescription Increase Physical Activity;Increase Strength and Stamina;Understanding of Exercise Prescription      Comments Devin [name] has been doing well in rehab.  He is walking with his dog at home a 1/4 mile each day.  We talked about extending this time out some.  He does note he has some more stamina overall. Devin [name] is making steady progress.  He has increased to 2.4 mph on TM and 5 lb weights for strength.      Expected Outcomes Short: Walk longer at home Long; Continue to improve stamina. Short: continue to increase workloads Long :  improve MET level             Discharge Exercise Prescription (Final Exercise Prescription Changes):  Exercise Prescription Changes - 08/25/19 1400      Response to Exercise   Blood Pressure (Admit) 122/66    Blood Pressure (Exercise) 138/78    Blood Pressure (Exit) 122/64    Heart Rate (Admit) 84 bpm    Heart Rate (Exercise) 129 bpm    Heart Rate (Exit) 71 bpm    Rating of Perceived Exertion (Exercise) 14    Symptoms none    Duration Continue with 30 min of aerobic exercise without signs/symptoms of physical distress.    Intensity THRR unchanged      Progression   Progression Continue to progress workloads to maintain intensity without signs/symptoms of physical distress.    Average METs 3.85      Resistance Training   Training Prescription Yes    Weight 5 lb    Reps 10-15      Interval Training   Interval Training No      NuStep   Level 4    SPM 80    Minutes 15    METs 4      Biostep-RELP   Level 4    SPM 50    Minutes 15    METs 3      Home Exercise Plan   Plans to continue exercise at Home (comment)   walking   Frequency Add 2 additional days to program exercise sessions.    Initial Home Exercises Provided 08/03/19           Nutrition:  Target Goals: Understanding of  nutrition guidelines, daily intake of sodium '1500mg'$ , cholesterol '200mg'$ , calories 30% from fat and 7% or less from saturated fats, daily to have 5 or more servings of fruits and vegetables.  Education: Controlling Sodium/Reading Food Labels -Group verbal and written material supporting the discussion of sodium use in heart healthy nutrition. Review and explanation with models, verbal and written materials for utilization of the food label.   Education: General Nutrition Guidelines/Fats and Fiber: -Group instruction provided by verbal, written material, models and posters to present the general guidelines for heart healthy nutrition. Gives an explanation and review of dietary fats and fiber.   Biometrics:  Pre Biometrics - 07/02/19 1254      Pre Biometrics   Height 5' 11.1" (1.806 m)    Weight 208 lb 14.4 oz (94.8 kg)    BMI (Calculated) 29.05    Single Leg Stand 14.6 seconds            Nutrition Therapy Plan and Nutrition Goals:  Nutrition Therapy & Goals - 07/15/19 1309      Nutrition Therapy   Diet Low Na, Heart Healthy, Diabetes Friendly    Protein (specify units) 75-80g  Fiber 30 grams    Whole Grain Foods 3 servings    Saturated Fats 12 max. grams    Fruits and Vegetables 5 servings/day    Sodium 1.5 grams      Personal Nutrition Goals   Nutrition Goal ST: substantial snack before and after exercise (bean salad) and bring applesauce to rehab LT: Eat for heart and diabetes    Comments Last a1c was 6. Doesn't eat lunch normally, discussed consistant eating for diabetes. Discussed heart healthy eating and diabetes friendly eating. Pt reports eating mostly heart healthy. Breakfast is cheerios with banana, blueberries, walnuts, and milk.      Intervention Plan   Intervention Prescribe, educate and counsel regarding individualized specific dietary modifications aiming towards targeted core components such as weight, hypertension, lipid management, diabetes, heart failure  and other comorbidities.;Nutrition handout(s) given to patient.    Expected Outcomes Short Term Goal: Understand basic principles of dietary content, such as calories, fat, sodium, cholesterol and nutrients.;Short Term Goal: A plan has been developed with personal nutrition goals set during dietitian appointment.;Long Term Goal: Adherence to prescribed nutrition plan.           Nutrition Assessments:  Nutrition Assessments - 07/02/19 1258      MEDFICTS Scores   Pre Score 47           MEDIFICTS Score Key:          ?70 Need to make dietary changes          40-70 Heart Healthy Diet         ? 40 Therapeutic Level Cholesterol Diet  Nutrition Goals Re-Evaluation:   Nutrition Goals Discharge (Final Nutrition Goals Re-Evaluation):   Psychosocial: Target Goals: Acknowledge presence or absence of significant depression and/or stress, maximize coping skills, provide positive support system. Participant is able to verbalize types and ability to use techniques and skills needed for reducing stress and depression.   Education: Depression - Provides group verbal and written instruction on the correlation between heart/lung disease and depressed mood, treatment options, and the stigmas associated with seeking treatment.   Education: Sleep Hygiene -Provides group verbal and written instruction about how sleep can affect your health.  Define sleep hygiene, discuss sleep cycles and impact of sleep habits. Review good sleep hygiene tips.     Education: Stress and Anxiety: - Provides group verbal and written instruction about the health risks of elevated stress and causes of high stress.  Discuss the correlation between heart/lung disease and anxiety and treatment options. Review healthy ways to manage with stress and anxiety.    Initial Review & Psychosocial Screening:  Initial Psych Review & Screening - 07/01/19 1115      Initial Review   Current issues with Current Sleep Concerns       Family Dynamics   Good Support System? No   Brothers in [place], neighbors are here,   Strains --   No family lives nearby 2 brothers in [place].   Comments Sleep concerns are chronic.      Barriers   Psychosocial barriers to participate in program There are no identifiable barriers or psychosocial needs.;The patient should benefit from training in stress management and relaxation.      Screening Interventions   Interventions Encouraged to exercise    Expected Outcomes Short Term goal: Utilizing psychosocial counselor, staff and physician to assist with identification of specific Stressors or current issues interfering with healing process. Setting desired goal for each stressor or current issue identified.;Long Term Goal:  Stressors or current issues are controlled or eliminated.;Short Term goal: Identification and review with participant of any Quality of Life or Depression concerns found by scoring the questionnaire.;Long Term goal: The participant improves quality of Life and PHQ9 Scores as seen by post scores and/or verbalization of changes           Quality of Life Scores:   Quality of Life - 07/02/19 1257      Quality of Life   Select Quality of Life      Quality of Life Scores   Health/Function Pre 26.57 %    Socioeconomic Pre 22.63 %    Psych/Spiritual Pre 29.14 %    Family Pre 17.67 %    GLOBAL Pre 25.72 %          Scores of 19 and below usually indicate a poorer quality of life in these areas.  A difference of  2-3 points is a clinically meaningful difference.  A difference of 2-3 points in the total score of the Quality of Life Index has been associated with significant improvement in overall quality of life, self-image, physical symptoms, and general health in studies assessing change in quality of life.  PHQ-9: Recent Review Flowsheet Data    Depression screen [facility] 2/9 07/02/2019   Decreased Interest 2   Down, Depressed, Hopeless 1   PHQ - 2 Score 3   Altered  sleeping 3   Tired, decreased energy 2   Change in appetite 1   Feeling bad or failure about yourself  0   Trouble concentrating 0   Moving slowly or fidgety/restless 0   Suicidal thoughts 0   PHQ-9 Score 9   Difficult doing work/chores Not difficult at all     Interpretation of Total Score  Total Score Depression Severity:  1-4 = Minimal depression, 5-9 = Mild depression, 10-14 = Moderate depression, 15-19 = Moderately severe depression, 20-27 = Severe depression   Psychosocial Evaluation and Intervention:  Psychosocial Evaluation - 07/01/19 1141      Psychosocial Evaluation & Interventions   Interventions Encouraged to exercise with the program and follow exercise prescription    Comments Devin [name] ahs no barriers to attending the program.  He lives alone with his 13 year old dog.  HIs wife passed away 4 years ago. He does not have any family in [place]. He did state that he could call on his neighbors if he needed help. He is looking forward to attending the progran.    Expected Outcomes STG: Attends the progam on a consistent basis  LTG: Maintains his healthy lifestyle developed while in the program    Continue Psychosocial Services  Follow up required by staff           Psychosocial Re-Evaluation:  Psychosocial Re-Evaluation    [place] Name 08/19/19 1349             Psychosocial Re-Evaluation   Current issues with Current Sleep Concerns;Current Stress Concerns       Comments Devin [name] is doing better mentally.  He is sleeping better now.  He enjoys getting out to meet people and coming to class.       Expected Outcomes Short: Continue to attend rehab for mental boost Long; Continue to stay positive       Interventions Encouraged to attend Cardiac Rehabilitation for the exercise       Continue Psychosocial Services  Follow up required by staff  Psychosocial Discharge (Final Psychosocial Re-Evaluation):  Psychosocial Re-Evaluation - 08/19/19 1349      Psychosocial  Re-Evaluation   Current issues with Current Sleep Concerns;Current Stress Concerns    Comments Devin [name] is doing better mentally.  He is sleeping better now.  He enjoys getting out to meet people and coming to class.    Expected Outcomes Short: Continue to attend rehab for mental boost Long; Continue to stay positive    Interventions Encouraged to attend Cardiac Rehabilitation for the exercise    Continue Psychosocial Services  Follow up required by staff           Vocational Rehabilitation: Provide vocational rehab assistance to qualifying candidates.   Vocational Rehab Evaluation & Intervention:  Vocational Rehab - 07/01/19 1123      Initial Vocational Rehab Evaluation & Intervention   Assessment shows need for Vocational Rehabilitation No           Education: Education Goals: Education classes will be provided on a variety of topics geared toward better understanding of heart health and risk factor modification. Participant will state understanding/return demonstration of topics presented as noted by education test scores.  Learning Barriers/Preferences:  Learning Barriers/Preferences - 07/01/19 1122      Learning Barriers/Preferences   Learning Barriers None    Learning Preferences None           General Cardiac Education Topics:  AED/CPR: - Group verbal and written instruction with the use of models to demonstrate the basic use of the AED with the basic ABC's of resuscitation.   Anatomy & Physiology of the Heart: - Group verbal and written instruction and models provide basic cardiac anatomy and physiology, with the coronary electrical and arterial systems. Review of Valvular disease and Heart Failure   Cardiac Procedures: - Group verbal and written instruction to review commonly prescribed medications for heart disease. Reviews the medication, class of the drug, and side effects. Includes the steps to properly store meds and maintain the prescription regimen. (beta  blockers and nitrates)   Cardiac Medications I: - Group verbal and written instruction to review commonly prescribed medications for heart disease. Reviews the medication, class of the drug, and side effects. Includes the steps to properly store meds and maintain the prescription regimen.   Cardiac Medications II: -Group verbal and written instruction to review commonly prescribed medications for heart disease. Reviews the medication, class of the drug, and side effects. (all other drug classes)    Go Sex-Intimacy & Heart Disease, Get SMART - Goal Setting: - Group verbal and written instruction through game format to discuss heart disease and the return to sexual intimacy. Provides group verbal and written material to discuss and apply goal setting through the application of the S.M.A.R.T. Method.   Other Matters of the Heart: - Provides group verbal, written materials and models to describe Stable Angina and Peripheral Artery. Includes description of the disease process and treatment options available to the cardiac patient.   Infection Prevention: - Provides verbal and written material to individual with discussion of infection control including proper hand washing and proper equipment cleaning during exercise session.   Falls Prevention: - Provides verbal and written material to individual with discussion of falls prevention and safety.   Other: -Provides group and verbal instruction on various topics (see comments)   Knowledge Questionnaire Score:  Knowledge Questionnaire Score - 07/02/19 1258      Knowledge Questionnaire Score   Pre Score 24/26 Education Focus: Nutrition, Exercise  Core Components/Risk Factors/Patient Goals at Admission:  Personal Goals and Risk Factors at Admission - 07/02/19 1259      Core Components/Risk Factors/Patient Goals on Admission    Weight Management Yes;Weight Loss    Intervention Weight Management: Develop a combined  nutrition and exercise program designed to reach desired caloric intake, while maintaining appropriate intake of nutrient and fiber, sodium and fats, and appropriate energy expenditure required for the weight goal.;Weight Management: Provide education and appropriate resources to help participant work on and attain dietary goals.    Admit Weight 208 lb 14.4 oz (94.8 kg)    Goal Weight: Short Term 203 lb (92.1 kg)    Goal Weight: Long Term 198 lb (89.8 kg)    Expected Outcomes Short Term: Continue to assess and modify interventions until short term weight is achieved;Long Term: Adherence to nutrition and physical activity/exercise program aimed toward attainment of established weight goal;Weight Loss: Understanding of general recommendations for a balanced deficit meal plan, which promotes 1-2 lb weight loss per week and includes a negative energy balance of [phone] kcal/d;Understanding recommendations for meals to include 15-35% energy as protein, 25-35% energy from fat, 35-60% energy from carbohydrates, less than '200mg'$  of dietary cholesterol, 20-35 gm of total fiber daily;Understanding of distribution of calorie intake throughout the day with the consumption of 4-5 meals/snacks    Diabetes Yes    Intervention Provide education about signs/symptoms and action to take for hypo/hyperglycemia.;Provide education about proper nutrition, including hydration, and aerobic/resistive exercise prescription along with prescribed medications to achieve blood glucose in normal ranges: Fasting glucose 65-99 mg/dL    Expected Outcomes Short Term: Participant verbalizes understanding of the signs/symptoms and immediate care of hyper/hypoglycemia, proper foot care and importance of medication, aerobic/resistive exercise and nutrition plan for blood glucose control.;Long Term: Attainment of HbA1C < 7%.    Hypertension Yes    Intervention Provide education on lifestyle modifcations including regular physical  activity/exercise, weight management, moderate sodium restriction and increased consumption of fresh fruit, vegetables, and low fat dairy, alcohol moderation, and smoking cessation.;Monitor prescription use compliance.    Expected Outcomes Short Term: Continued assessment and intervention until BP is < 140/59m HG in hypertensive participants. < 130/856mHG in hypertensive participants with diabetes, heart failure or chronic kidney disease.;Long Term: Maintenance of blood pressure at goal levels.    Lipids Yes    Intervention Provide education and support for participant on nutrition & aerobic/resistive exercise along with prescribed medications to achieve LDL '70mg'$ , HDL >'40mg'$ .    Expected Outcomes Short Term: Participant states understanding of desired cholesterol values and is compliant with medications prescribed. Participant is following exercise prescription and nutrition guidelines.;Long Term: Cholesterol controlled with medications as prescribed, with individualized exercise RX and with personalized nutrition plan. Value goals: LDL < '70mg'$ , HDL > 40 mg.           Education:Diabetes - Individual verbal and written instruction to review signs/symptoms of diabetes, desired ranges of glucose level fasting, after meals and with exercise. Acknowledge that pre and post exercise glucose checks will be done for 3 sessions at entry of program.   Education: Know Your Numbers and Risk Factors: -Group verbal and written instruction about important numbers in your health.  Discussion of what are risk factors and how they play a role in the disease process.  Review of Cholesterol, Blood Pressure, Diabetes, and BMI and the role they play in your overall health.   Core Components/Risk Factors/Patient Goals Review:   Goals and Risk Factor Review  [place] Name 08/19/19 1350             Core Components/Risk Factors/Patient Goals Review   Personal Goals Review Weight  Management/Obesity;Hypertension;Diabetes;Lipids       Review Devin [name] is doing well in rehab.  His weight is trending down.  He keeps a good eye on his pressures and sugars.  His pressures have continued to do well.  When he first started, his sugars were dropping with exercise, now he has made changes and is staying stable.       Expected Outcomes Short: Continue to work on weight loss Long; Continue to manage diabetes better.              Core Components/Risk Factors/Patient Goals at Discharge (Final Review):   Goals and Risk Factor Review - 08/19/19 1350      Core Components/Risk Factors/Patient Goals Review   Personal Goals Review Weight Management/Obesity;Hypertension;Diabetes;Lipids    Review Devin [name] is doing well in rehab.  His weight is trending down.  He keeps a good eye on his pressures and sugars.  His pressures have continued to do well.  When he first started, his sugars were dropping with exercise, now he has made changes and is staying stable.    Expected Outcomes Short: Continue to work on weight loss Long; Continue to manage diabetes better.           ITP Comments:  ITP Comments    [place] Name 07/01/19 1139 07/02/19 1239 07/06/19 1402 07/29/19 4301 08/26/19 0629   ITP Comments Virtual Orientation completed . Has EP Eval and Gym Orientation scheduled for tomorrow.  Documentation of diagnosis can be found in [facility] 05/22/2019 Completed 6MWT and gym orientation.  Initial ITP created and sent for review to Dr. Emily Filbert, Medical Director. First full day of exercise!  Patient was oriented to gym and equipment including functions, settings, policies, and procedures.  Patient's individual exercise prescription and treatment plan were reviewed.  All starting workloads were established based on the results of the 6 minute walk test done at initial orientation visit.  The plan for exercise progression was also introduced and progression will be customized based on patient's performance and goals. 30  Day review completed. ITP review done, changes made as directed,and approval shown by signature of  Scientist, research (life sciences). 30 Day review completed. Medical Director ITP review done, changes made as directed, and signed approval by Medical Director.          Comments: 30 Day review completed. Medical Director ITP review done, changes made as directed, and signed approval by Medical Director.

## 2019-08-26 NOTE — Progress Notes (Signed)
Daily Session Note  Patient Details  Name: Devin [name] [facility]. MRN: [id number] Date of Birth: September 05, 1945 Referring Provider:     Cardiac Rehab from 07/02/2019 in [facility] Cardiac and Pulmonary Rehab  Referring Provider Neoma Laming MD      Encounter Date: 08/26/2019  Check In:  Session Check In - 08/26/19 1325      Check-In   Supervising physician immediately available to respond to emergencies See telemetry face sheet for immediately available ER MD    Location [facility]-Cardiac & Pulmonary Rehab    Staff Present Renita Papa, RN BSN;Melissa Caiola RDN, Rowe Pavy, BA, ACSM CEP, Exercise Physiologist    Virtual Visit No    Medication changes reported     No    Fall or balance concerns reported    No    Warm-up and Cool-down Performed on first and last piece of equipment    Resistance Training Performed Yes    VAD Patient? No    PAD/SET Patient? No      Pain Assessment   Currently in Pain? No/denies              Social History   Tobacco Use  Smoking Status Former Smoker  Smokeless Tobacco Never Used  Tobacco Comment   Quit over 40 years ago    Goals Met:  Independence with exercise equipment Exercise tolerated well No report of cardiac concerns or symptoms Strength training completed today  Goals Unmet:  Not Applicable  Comments: Pt able to follow exercise prescription today without complaint.  Will continue to monitor for progression.'   Dr. Emily Filbert is Medical Director for [place] and [facility].

## 2019-08-31 ENCOUNTER — Other Ambulatory Visit: Payer: Self-pay

## 2019-08-31 ENCOUNTER — Encounter: Payer: Medicare HMO | Admitting: *Deleted

## 2019-08-31 DIAGNOSIS — Z951 Presence of aortocoronary bypass graft: Secondary | ICD-10-CM | POA: Diagnosis not present

## 2019-08-31 NOTE — Progress Notes (Signed)
Daily Session Note  Patient Details  Name: Devin [name] [facility]. MRN: [id number] Date of Birth: [birth year] Referring Provider:     Cardiac Rehab from 07/02/2019 in [facility] Cardiac and Pulmonary Rehab  Referring Provider Neoma Laming MD      Encounter Date: 08/31/2019  Check In:  Session Check In - 08/31/19 1329      Check-In   Supervising physician immediately available to respond to emergencies See telemetry face sheet for immediately available ER MD    Location [facility]-Cardiac & Pulmonary Rehab    Staff Present Renita Papa, RN BSN;Joseph Hood RCP,RRT,BSRT;Melissa Richards RDN, LDN    Virtual Visit No    Medication changes reported     No    Fall or balance concerns reported    Yes    Comments hurt right hand, but healing well    Warm-up and Cool-down Performed on first and last piece of equipment    Resistance Training Performed Yes    VAD Patient? No    PAD/SET Patient? No      Pain Assessment   Currently in Pain? No/denies              Social History   Tobacco Use  Smoking Status Former Smoker  Smokeless Tobacco Never Used  Tobacco Comment   Quit over 40 years ago    Goals Met:  Independence with exercise equipment Exercise tolerated well No report of cardiac concerns or symptoms Strength training completed today  Goals Unmet:  Not Applicable  Comments: Pt able to follow exercise prescription today without complaint.  Will continue to monitor for progression.    Dr. Emily Filbert is Medical Director for [place] and [facility].

## 2019-09-02 ENCOUNTER — Ambulatory Visit (INDEPENDENT_AMBULATORY_CARE_PROVIDER_SITE_OTHER): Payer: Medicare HMO

## 2019-09-02 ENCOUNTER — Encounter: Payer: Self-pay | Admitting: Emergency Medicine

## 2019-09-02 ENCOUNTER — Encounter: Payer: Medicare HMO | Admitting: *Deleted

## 2019-09-02 ENCOUNTER — Ambulatory Visit
Admission: EM | Admit: 2019-09-02 | Discharge: 2019-09-02 | Disposition: A | Discharge status: Home / Self Care | Payer: Medicare HMO | Attending: Emergency Medicine | Admitting: Emergency Medicine

## 2019-09-02 ENCOUNTER — Other Ambulatory Visit: Payer: Self-pay

## 2019-09-02 DIAGNOSIS — M25541 Pain in joints of right hand: Secondary | ICD-10-CM

## 2019-09-02 DIAGNOSIS — W19XXXA Unspecified fall, initial encounter: Secondary | ICD-10-CM

## 2019-09-02 DIAGNOSIS — S60221A Contusion of right hand, initial encounter: Secondary | ICD-10-CM

## 2019-09-02 DIAGNOSIS — S0003XA Contusion of scalp, initial encounter: Secondary | ICD-10-CM | POA: Diagnosis not present

## 2019-09-02 DIAGNOSIS — Z951 Presence of aortocoronary bypass graft: Secondary | ICD-10-CM

## 2019-09-02 NOTE — Progress Notes (Signed)
Daily Session Note  Patient Details  Name: Devin [name] [facility]. MRN: [id number] Date of Birth: [birth year] Referring Provider:     Cardiac Rehab from 07/02/2019 in [facility] Cardiac and Pulmonary Rehab  Referring Provider Neoma Laming MD      Encounter Date: 09/02/2019  Check In:  Session Check In - 09/02/19 1341      Check-In   Supervising physician immediately available to respond to emergencies See telemetry face sheet for immediately available ER MD    Location [facility]-Cardiac & Pulmonary Rehab    Staff Present Renita Papa, RN BSN;Melissa Caiola RDN, Rowe Pavy, BA, ACSM CEP, Exercise Physiologist;Jessica Luan Pulling, MA, RCEP, CCRP, CCET    Virtual Visit No    Medication changes reported     No    Fall or balance concerns reported    No    Warm-up and Cool-down Performed on first and last piece of equipment    Resistance Training Performed Yes    VAD Patient? No    PAD/SET Patient? No      Pain Assessment   Currently in Pain? No/denies              Social History   Tobacco Use  Smoking Status Former Smoker  Smokeless Tobacco Never Used  Tobacco Comment   Quit over 40 years ago    Goals Met:  Independence with exercise equipment Exercise tolerated well No report of cardiac concerns or symptoms Strength training completed today  Goals Unmet:  Not Applicable  Comments: Pt able to follow exercise prescription today without complaint.  Will continue to monitor for progression.    Dr. Emily Filbert is Medical Director for [place] and [facility].

## 2019-09-02 NOTE — ED Provider Notes (Signed)
[facility]    CSN: [id number] Arrival date & time: 09/02/19  1503      History   Chief Complaint Chief Complaint  Patient presents with  . Hand Injury  . Head Injury    HPI Devin [name]. is a 74 y.o. male.   74 year old male presents with right hand swelling and pain that occurred about 1 week ago. He was in his kitchen at home and tripped on a rug. He tried to catch himself with his right hand but still fell and hit the left side of his head on the countertop and "wacked" his right hand. He has experienced continued swelling at the base of his fingers and is unable to make a fist. He also has some tenderness still present near the temporal area of the left side of his scalp and mild swelling but no LOC or bleeding. He is concerned over possible fracture of his hand. He has a history of CAD and recently had a stent placed in March 2021 and currently on Eliquis so unable to take other NSAIDs. He takes Tylenol nightly. Other chronic health issues include HTN, hyperlipidemia, type 2 DM, sleep apnea, GERD, and depression. Currently on Coreg, Imdur, Lipitor, Zetia, Metformin, Wellbutrin, Paxil, Gabapentin, Nexium and Atrovent daily.   The history is provided by the patient.    Past Medical History:  Diagnosis Date  . CAD (coronary artery disease)   . Diabetes mellitus without complication ([place])   . Hypertension   . MI, old   . Sleep apnea     Patient Active Problem List   Diagnosis Date Noted  . Chest pain 05/21/2019  . Unstable angina ([place])   . Ischemic chest pain ([place]) 05/16/2019  . CAD (coronary artery disease) 05/16/2019  . Type 2 diabetes mellitus without complication ([place]) 08/65/7846  . Essential hypertension 05/16/2019  . Depression 05/16/2019  . GERD (gastroesophageal reflux disease) 05/16/2019    Past Surgical History:  Procedure Laterality Date  . APPENDECTOMY    . BREAST SURGERY     benign mass  . CORONARY ANGIOPLASTY WITH STENT  PLACEMENT    . LEFT HEART CATH AND CORS/GRAFTS ANGIOGRAPHY N/A 05/21/2019   Procedure: LEFT HEART CATH AND CORONARY ANGIOGRAPHY;  Surgeon: Dionisio David, MD;  Location: [facility];  Service: Cardiovascular;  Laterality: N/A;  . REPLACEMENT TOTAL KNEE BILATERAL         Home Medications    Prior to Admission medications   Medication Sig Start Date End Date Taking? Authorizing Provider  acetaminophen (TYLENOL) 500 MG tablet Take 1,000 mg by mouth at bedtime.   Yes [provider]  ascorbic acid (VITAMIN C) 500 MG tablet Take 500 mg by mouth daily.   Yes [provider]  atorvastatin (LIPITOR) 80 MG tablet Take 1 tablet (80 mg total) by mouth daily. 05/16/19 05/15/20 Yes Agbata, Tochukwu, MD  buPROPion (WELLBUTRIN XL) 300 MG 24 hr tablet Take by mouth.   Yes [provider]  carvedilol (COREG) 25 MG tablet Take 1 tablet (25 mg total) by mouth 2 (two) times daily. 05/16/19 05/15/20 Yes Agbata, Tochukwu, MD  Cholecalciferol 125 MCG (5000 UT) TABS Take 5,000 Units by mouth daily.   Yes [provider]  Coenzyme Q10 100 MG capsule Take by mouth.   Yes [provider]  EPINEPHrine (EPIPEN JR) 0.15 MG/0.3ML injection Inject into the muscle.   Yes [provider]  esomeprazole (NEXIUM) 20 MG capsule Take 20 mg  by mouth daily.   Yes [provider]  ezetimibe (ZETIA) 10 MG tablet Take 10 mg by mouth daily. 04/06/19  Yes [provider]  gabapentin (NEURONTIN) 300 MG capsule Take 300 mg by mouth 2 (two) times daily. 05/01/19  Yes [provider]  ipratropium (ATROVENT HFA) 17 MCG/ACT inhaler Inhale into the lungs.   Yes [provider]  ipratropium (ATROVENT) 0.02 % nebulizer solution Inhale into the lungs. 06/05/19  Yes [provider]  isosorbide mononitrate (IMDUR) 30 MG 24 hr tablet Take 1 tablet (30 mg total) by mouth daily. 05/16/19  Yes Agbata, Tochukwu, MD  metFORMIN (GLUCOPHAGE) 500 MG tablet Take  500 mg by mouth 2 (two) times daily. 02/17/19  Yes [provider]  Multiple Vitamin (MULTI-VITAMIN) tablet Take 1 tablet by mouth daily. 01/06/10  Yes [provider]  PARoxetine (PAXIL) 30 MG tablet Take 30 mg by mouth daily. 03/19/19  Yes [provider]  apixaban (ELIQUIS) 5 MG TABS tablet Take by mouth. 06/05/19 08/22/19  [provider]  benazepril (LOTENSIN) 20 MG tablet Take 20 mg by mouth daily. 03/19/19 08/13/19  [provider]    Family History Family History  Problem Relation Age of Onset  . Osteoarthritis Mother   . Other Father 59       MVA    Social History Social History   Tobacco Use  . Smoking status: Former Games developer  . Smokeless tobacco: Never Used  . Tobacco comment: Quit over 40 years ago  Vaping Use  . Vaping Use: Never used  Substance Use Topics  . Alcohol use: Yes    Comment: social  . Drug use: Never     Allergies   Ace inhibitors, Morphine, and Yellow jacket venom [bee venom]   Review of Systems Review of Systems  Constitutional: Negative for activity change, chills, fatigue and fever.  HENT: Negative for facial swelling and nosebleeds.   Eyes: Negative for photophobia and visual disturbance.  Musculoskeletal: Positive for arthralgias and joint swelling.  Skin: Negative for color change, rash and wound.  Allergic/Immunologic: Negative for food allergies and immunocompromised state.  Neurological: Positive for headaches (sore area on left side of head). Negative for dizziness, seizures, syncope, facial asymmetry, speech difficulty, weakness, light-headedness and numbness.  Hematological: Negative for adenopathy. Bruises/bleeds easily.     Physical Exam Triage Vital Signs ED Triage Vitals  Enc Vitals Group     BP 09/02/19 1520 96/69     Pulse Rate 09/02/19 1520 77     Resp --      Temp 09/02/19 1520 97.8 F (36.6 C)     Temp Source 09/02/19 1520 Oral     SpO2 09/02/19 1520 98 %     Weight 09/02/19  1518 205 lb (93 kg)     Height 09/02/19 1518 6' (1.829 m)     Head Circumference --      Peak Flow --      Pain Score 09/02/19 1518 10     Pain Loc --      Pain Edu? --      Excl. in GC? --    No data found.  Updated Vital Signs BP 96/69 (BP Location: Right Arm)   Pulse 77   Temp 97.8 F (36.6 C) (Oral)   Ht 6' (1.829 m)   Wt 205 lb (93 kg)   SpO2 98%   BMI 27.80 kg/m   Visual Acuity Right Eye Distance:   Left Eye Distance:  Bilateral Distance:    Right Eye Near:   Left Eye Near:    Bilateral Near:     Physical Exam Vitals and nursing note reviewed.  Constitutional:      General: He is awake. He is not in acute distress.    Appearance: He is well-developed and well-groomed. He is not ill-appearing.     Comments: He is sitting comfortably in the exam chair in no acute distress.   HENT:     Head: Normocephalic. Contusion present. No abrasion or laceration. Hair is normal.      Comments: 2cm area of tenderness and slight swelling present on scalp mid-temporal to parietal area. No distinct bruising or ecchymosis present on scalp. No abrasion or laceration present. No surrounding erythema.     Right Ear: External ear normal.     Left Ear: External ear normal.  Eyes:     Extraocular Movements: Extraocular movements intact.     Conjunctiva/sclera: Conjunctivae normal.     Pupils: Pupils are equal, round, and reactive to light.  Cardiovascular:     Rate and Rhythm: Normal rate.  Pulmonary:     Effort: Pulmonary effort is normal.  Musculoskeletal:        General: Swelling and tenderness present.     Right hand: Swelling and tenderness present. Decreased range of motion. Normal strength. Normal sensation. There is no disruption of two-point discrimination. Normal capillary refill. Normal pulse.     Left hand: Normal.       Hands:     Cervical back: Normal range of motion.     Comments: Swelling present at base of fingers along head of metacarpals on dorsal aspect of  right hand. Slightly tender but no redness or bruising noted. Has full extension of fingers but unable to flex fingers and make a fist due to swelling. Normal pulses and good capillary refill. No neuro deficits noted.   Skin:    General: Skin is warm and dry.     Capillary Refill: Capillary refill takes less than 2 seconds.     Findings: Bruising and ecchymosis present.     Comments: Multiple areas on lower and upper arms bilaterally with various stages of ecchymosis/bruising present. No distinct bruising on scalp at site of injury or on dorsal aspect of right hand.   Neurological:     General: No focal deficit present.     Mental Status: He is alert and oriented to person, place, and time.     Cranial Nerves: Cranial nerves are intact.     Sensory: Sensation is intact. No sensory deficit.     Motor: Motor function is intact.  Psychiatric:        Attention and Perception: Attention normal.        Mood and Affect: Mood normal.        Speech: Speech normal.        Behavior: Behavior normal. Behavior is cooperative.        Thought Content: Thought content normal.        Cognition and Memory: Cognition normal.        Judgment: Judgment normal.      UC Treatments / Results  Labs (all labs ordered are listed, but only abnormal results are displayed) Labs Reviewed - No data to display  EKG   Radiology DG Hand Complete Right  Result Date: 09/02/2019 CLINICAL DATA:  Fall with swelling and bruising EXAM: RIGHT HAND - COMPLETE 3+ VIEW COMPARISON:  None. FINDINGS: No acute displaced  fracture or malalignment. Old fifth metacarpal fracture deformity. Corticated osseous densities dorsal to the third D IP joint, doubt acute fracture fragments. No radiopaque foreign body. IMPRESSION: No definite acute osseous abnormality. Old fifth metacarpal fracture deformity Electronically Signed   By: Jasmine Pang M.D.   On: 09/02/2019 16:21    Procedures Procedures (including critical care  time)  Medications Ordered in UC Medications - No data to display  Initial Impression / Assessment and Plan / UC Course  I have reviewed the triage vital signs and the nursing notes.  Pertinent labs & imaging results that were available during my care of the patient were reviewed by me and considered in my medical decision making (see chart for details).    Reviewed x-ray results with patient- no distinct fracture or dislocation seen. Recommend continue to monitor swelling and pain. Recommend wear ace wrap for support and for compression to help with swelling since unable to take anti-inflammatory medications. Continue Tylenol 1000mg  every 8 hours as needed for pain. Reviewed that no distinct neurological effects seen from head injury. Continue to monitor area. Recommend follow-up with his PCP for referral to Orthopedic if hand swelling and pain does not improve.  Final Clinical Impressions(s) / UC Diagnoses   Final diagnoses:  Pain in joints of right hand  Contusion of right hand, initial encounter  Contusion of scalp, initial encounter  Fall at home, initial encounter     Discharge Instructions     Recommend wear the ace wrap around your hand to help decrease swelling. Continue Tylenol as needed for pain. If swelling and pain does not improve, follow-up with your PCP for referral to Orthopedic.     ED Prescriptions    None     PDMP not reviewed this encounter.   , NP 09/03/19 1446

## 2019-09-02 NOTE — ED Triage Notes (Signed)
Patient states he fell on Thursday and when he tried to brace his fall he injured his right hand. He also reports that he hit his head, denies LOC, no laceration but reports tenderness on the left side of his head.

## 2019-09-02 NOTE — Discharge Instructions (Addendum)
Recommend wear the ace wrap around your hand to help decrease swelling. Continue Tylenol as needed for pain. If swelling and pain does not improve, follow-up with your PCP for referral to Orthopedic.

## 2019-09-07 ENCOUNTER — Other Ambulatory Visit: Payer: Self-pay

## 2019-09-07 ENCOUNTER — Encounter: Payer: Medicare HMO | Admitting: *Deleted

## 2019-09-07 DIAGNOSIS — Z951 Presence of aortocoronary bypass graft: Secondary | ICD-10-CM

## 2019-09-07 NOTE — Progress Notes (Signed)
Daily Session Note  Patient Details  Name: Devin [name]. MRN: [id number] Date of Birth: Mar 25, 1945 Referring Provider:     Cardiac Rehab from 07/02/2019 in [facility] Cardiac and Pulmonary Rehab  Referring Provider Neoma Laming MD      Encounter Date: 09/07/2019  Check In:  Session Check In - 09/07/19 1331      Check-In   Supervising physician immediately available to respond to emergencies See telemetry face sheet for immediately available ER MD    Location [facility]-Cardiac & Pulmonary Rehab    Staff Present Renita Papa, RN BSN;Jessica Courtenay, MA, RCEP, CCRP, CCET;Amanda Sommer, [place], ACSM CEP, Exercise Physiologist    Virtual Visit No    Medication changes reported     No    Fall or balance concerns reported    No    Warm-up and Cool-down Performed on first and last piece of equipment    Resistance Training Performed Yes    VAD Patient? No    PAD/SET Patient? No      Pain Assessment   Currently in Pain? No/denies              Social History   Tobacco Use  Smoking Status Former Smoker  Smokeless Tobacco Never Used  Tobacco Comment   Quit over 40 years ago    Goals Met:  Independence with exercise equipment Exercise tolerated well No report of cardiac concerns or symptoms Strength training completed today  Goals Unmet:  Not Applicable  Comments: Pt able to follow exercise prescription today without complaint.  Will continue to monitor for progression.    Dr. Emily Filbert is Medical Director for [place] and [facility].

## 2019-09-09 ENCOUNTER — Encounter: Payer: Medicare HMO | Admitting: *Deleted

## 2019-09-09 ENCOUNTER — Other Ambulatory Visit: Payer: Self-pay

## 2019-09-09 DIAGNOSIS — Z951 Presence of aortocoronary bypass graft: Secondary | ICD-10-CM | POA: Diagnosis not present

## 2019-09-09 NOTE — Progress Notes (Signed)
Daily Session Note  Patient Details  Name: Jariah [facility]. MRN: [id number] Date of Birth: 05/09/1945 Referring Provider:     Cardiac Rehab from 07/02/2019 in [facility] Cardiac and Pulmonary Rehab  Referring Provider Neoma Laming MD      Encounter Date: 09/09/2019  Check In:  Session Check In - 09/09/19 1348      Check-In   Supervising physician immediately available to respond to emergencies See telemetry face sheet for immediately available ER MD    Location [facility]-Cardiac & Pulmonary Rehab    Staff Present Renita Papa, RN Vickki Hearing, BA, ACSM CEP, Exercise Physiologist;Laureen Owens Shark, BS, RRT, CPFT    Virtual Visit No    Medication changes reported     No    Fall or balance concerns reported    No    Warm-up and Cool-down Performed on first and last piece of equipment    Resistance Training Performed Yes    VAD Patient? No    PAD/SET Patient? No      Pain Assessment   Currently in Pain? No/denies              Social History   Tobacco Use  Smoking Status Former Smoker  Smokeless Tobacco Never Used  Tobacco Comment   Quit over 40 years ago    Goals Met:  Independence with exercise equipment Exercise tolerated well No report of cardiac concerns or symptoms Strength training completed today  Goals Unmet:  Not Applicable  Comments: Pt able to follow exercise prescription today without complaint.  Will continue to monitor for progression.    Dr. Emily Filbert is Medical Director for [place] and [facility].

## 2019-09-16 ENCOUNTER — Encounter: Payer: Medicare HMO | Attending: Cardiovascular Disease | Admitting: *Deleted

## 2019-09-16 ENCOUNTER — Other Ambulatory Visit: Payer: Self-pay

## 2019-09-16 DIAGNOSIS — Z951 Presence of aortocoronary bypass graft: Secondary | ICD-10-CM | POA: Diagnosis present

## 2019-09-16 DIAGNOSIS — Z87891 Personal history of nicotine dependence: Secondary | ICD-10-CM | POA: Diagnosis not present

## 2019-09-16 NOTE — Progress Notes (Signed)
Daily Session Note  Patient Details  Name: Devin [name]. MRN: [id number] Date of Birth: 1945/12/21 Referring Provider:     Cardiac Rehab from 07/02/2019 in [name] Cardiac and Pulmonary Rehab  Referring Provider Neoma Laming MD      Encounter Date: 09/16/2019  Check In:  Session Check In - 09/16/19 1418      Check-In   Supervising physician immediately available to respond to emergencies See telemetry face sheet for immediately available ER MD    Location [facility]-Cardiac & Pulmonary Rehab    Staff Present Renita Papa, RN BSN;Joseph [address] [place], [place], [place], CCRP, Marylynn Pearson, [place] Exercise Physiologist    Virtual Visit No    Medication changes reported     No    Fall or balance concerns reported    No    Warm-up and Cool-down Performed on first and last piece of equipment    Resistance Training Performed Yes    VAD Patient? No    PAD/SET Patient? No      Pain Assessment   Currently in Pain? No/denies              Social History   Tobacco Use  Smoking Status Former Smoker  Smokeless Tobacco Never Used  Tobacco Comment   Quit over 40 years ago    Goals Met:  Independence with exercise equipment Exercise tolerated well No report of cardiac concerns or symptoms Strength training completed today  Goals Unmet:  Not Applicable  Comments: Pt able to follow exercise prescription today without complaint.  Will continue to monitor for progression.    Dr. Emily Filbert is Medical Director for [place] and [facility].

## 2019-09-21 ENCOUNTER — Other Ambulatory Visit: Payer: Self-pay

## 2019-09-21 ENCOUNTER — Encounter: Payer: Medicare HMO | Admitting: *Deleted

## 2019-09-21 DIAGNOSIS — Z951 Presence of aortocoronary bypass graft: Secondary | ICD-10-CM | POA: Diagnosis not present

## 2019-09-21 NOTE — Progress Notes (Signed)
Daily Session Note  Patient Details  Name: Devin [name] [facility]. MRN: [id number] Date of Birth: May 03, 1945 Referring Provider:     Cardiac Rehab from 07/02/2019 in [facility] Cardiac and Pulmonary Rehab  Referring Provider Neoma Laming MD      Encounter Date: 09/21/2019  Check In:  Session Check In - 09/21/19 1431      Check-In   Supervising physician immediately available to respond to emergencies See telemetry face sheet for immediately available ER MD    Location [facility]-Cardiac & Pulmonary Rehab    Staff Present Renita Papa, RN Margurite Auerbach, MS Exercise Physiologist;Kelly Amedeo Plenty, BS, ACSM CEP, Exercise Physiologist    Virtual Visit No    Medication changes reported     No    Fall or balance concerns reported    No    Warm-up and Cool-down Performed on first and last piece of equipment    Resistance Training Performed Yes    VAD Patient? No    PAD/SET Patient? No      Pain Assessment   Currently in Pain? No/denies              Social History   Tobacco Use  Smoking Status Former Smoker  Smokeless Tobacco Never Used  Tobacco Comment   Quit over 40 years ago    Goals Met:  Independence with exercise equipment Exercise tolerated well No report of cardiac concerns or symptoms Strength training completed today  Goals Unmet:  Not Applicable  Comments: Pt able to follow exercise prescription today without complaint.  Will continue to monitor for progression.    Dr. Emily Filbert is Medical Director for [place] and [facility].

## 2019-09-23 ENCOUNTER — Other Ambulatory Visit: Payer: Self-pay

## 2019-09-23 ENCOUNTER — Encounter: Payer: Medicare HMO | Admitting: *Deleted

## 2019-09-23 ENCOUNTER — Encounter: Payer: Self-pay | Admitting: *Deleted

## 2019-09-23 DIAGNOSIS — Z951 Presence of aortocoronary bypass graft: Secondary | ICD-10-CM

## 2019-09-23 NOTE — Progress Notes (Signed)
Cardiac Individual Treatment Plan  Patient Details  Name: Devin [name] [facility]. MRN: [id number] Date of Birth: 1945-05-05 Referring Provider:     Cardiac Rehab from 07/02/2019 in [facility] Cardiac and Pulmonary Rehab  Referring Provider Neoma Laming MD      Initial Encounter Date:    Cardiac Rehab from 07/02/2019 in [facility] Cardiac and Pulmonary Rehab  Date 07/02/19      Visit Diagnosis: S/P CABG x 1  Patient's Home Medications on Admission:  Current Outpatient Medications:  .  acetaminophen (TYLENOL) 500 MG tablet, Take 1,000 mg by mouth at bedtime., Disp: , Rfl:  .  apixaban (ELIQUIS) 5 MG TABS tablet, Take by mouth., Disp: , Rfl:  .  ascorbic acid (VITAMIN C) 500 MG tablet, Take 500 mg by mouth daily., Disp: , Rfl:  .  atorvastatin (LIPITOR) 80 MG tablet, Take 1 tablet (80 mg total) by mouth daily., Disp: 30 tablet, Rfl: 11 .  buPROPion (WELLBUTRIN XL) 300 MG 24 hr tablet, Take by mouth., Disp: , Rfl:  .  carvedilol (COREG) 25 MG tablet, Take 1 tablet (25 mg total) by mouth 2 (two) times daily., Disp: 60 tablet, Rfl: 11 .  Cholecalciferol 125 MCG (5000 UT) TABS, Take 5,000 Units by mouth daily., Disp: , Rfl:  .  Coenzyme Q10 100 MG capsule, Take by mouth., Disp: , Rfl:  .  EPINEPHrine (EPIPEN JR) 0.15 MG/0.3ML injection, Inject into the muscle., Disp: , Rfl:  .  esomeprazole (NEXIUM) 20 MG capsule, Take 20 mg by mouth daily., Disp: , Rfl:  .  ezetimibe (ZETIA) 10 MG tablet, Take 10 mg by mouth daily., Disp: , Rfl:  .  gabapentin (NEURONTIN) 300 MG capsule, Take 300 mg by mouth 2 (two) times daily., Disp: , Rfl:  .  ipratropium (ATROVENT HFA) 17 MCG/ACT inhaler, Inhale into the lungs., Disp: , Rfl:  .  ipratropium (ATROVENT) 0.02 % nebulizer solution, Inhale into the lungs., Disp: , Rfl:  .  isosorbide mononitrate (IMDUR) 30 MG 24 hr tablet, Take 1 tablet (30 mg total) by mouth daily., Disp: 30 tablet, Rfl: 1 .  metFORMIN (GLUCOPHAGE) 500 MG tablet, Take 500 mg by mouth 2 (two) times  daily., Disp: , Rfl:  .  Multiple Vitamin (MULTI-VITAMIN) tablet, Take 1 tablet by mouth daily., Disp: , Rfl:  .  PARoxetine (PAXIL) 30 MG tablet, Take 30 mg by mouth daily., Disp: , Rfl:  No current facility-administered medications for this visit.  Facility-Administered Medications Ordered in Other Visits:  .  sodium chloride flush (NS) 0.9 % injection 3 mL, 3 mL, Intravenous, Q12H, Dionisio David, MD  Past Medical History: Past Medical History:  Diagnosis Date  . CAD (coronary artery disease)   . Diabetes mellitus without complication ([place])   . Hypertension   . MI, old   . 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   Exercise Prescription Goal: Initial exercise prescription builds to 30-45 minutes a day of aerobic  activity, 2-3 days per week.  Home exercise guidelines will be given to patient during program as part of exercise prescription that the participant will acknowledge.   Education: Aerobic Exercise & Resistance Training: - Gives group verbal and written instruction on the various components of exercise. Focuses on aerobic and resistive training programs and the benefits of this training and how to safely progress through these programs..   Education: Exercise & Equipment Safety: - Individual verbal instruction and demonstration of equipment use and safety with use of the equipment.   Education: Exercise Physiology & General Exercise Guidelines: - Group verbal and written instruction with  models to review the exercise physiology of the cardiovascular system and associated critical values. Provides general exercise guidelines with specific guidelines to those with heart or lung disease.    Education: Flexibility, Balance, Mind/Body Relaxation: Provides group verbal/written instruction on the benefits of flexibility and balance training, including mind/body exercise modes such as yoga, pilates and tai chi.  Demonstration and skill practice provided.   Activity Barriers & Risk Stratification:  Activity Barriers & Cardiac Risk Stratification - 07/02/19 1240      Activity Barriers & Cardiac Risk Stratification   Activity Barriers Deconditioning;Left Knee Replacement;Right Knee Replacement;Balance Concerns;Joint Problems   some shoulder issues   Cardiac Risk Stratification Moderate           6 Minute Walk:  6 Minute Walk    Row Name 07/02/19 1239         6 Minute Walk   Phase Initial     Distance 1085 feet     Walk Time 6 minutes     # of Rest Breaks 0     MPH 2.05     METS 2.56     RPE 12     Perceived Dyspnea  1     VO2 Peak 7.21     Symptoms Yes (comment)     Comments SOB     Resting HR 72 bpm     Resting BP 104/66     Resting Oxygen Saturation  99 %     Exercise Oxygen Saturation  during 6 min walk 96 %     Max Ex. HR 84 bpm     Max Ex. BP 122/64     2 Minute Post BP 122/56            Oxygen Initial Assessment:   Oxygen Re-Evaluation:   Oxygen Discharge (Final Oxygen Re-Evaluation):   Initial Exercise Prescription:  Initial Exercise Prescription - 07/02/19 1200      Date of Initial Exercise RX and Referring Provider   Date 07/02/19    Referring Provider Neoma Laming MD      Treadmill   MPH 2    Grade 0    Minutes 15    METs 2.53      Recumbant Elliptical   Level 1    RPM 50    Minutes 15    METs 2      Elliptical   Level 1    Speed 2    Minutes 15      Prescription Details   Frequency (times per week) 3    Duration  Progress to 30 minutes of continuous aerobic without signs/symptoms of physical distress      Intensity   THRR 40-80% of Max Heartrate 102-131    Ratings of Perceived Exertion 11-13    Perceived Dyspnea 0-4      Progression   Progression Continue to  progress workloads to maintain intensity without signs/symptoms of physical distress.      Resistance Training   Training Prescription Yes    Weight 3 lb    Reps 10-15           Perform Capillary Blood Glucose checks as needed.  Exercise Prescription Changes:  Exercise Prescription Changes    Row Name 07/02/19 1200 07/14/19 1600 07/28/19 1300 08/11/19 1500 08/25/19 1400     Response to Exercise   Blood Pressure (Admit) 104/66 110/60 104/54 100/64 122/66   Blood Pressure (Exercise) 122/64 142/64 128/64 138/54 138/78   Blood Pressure (Exit) 122/56 126/78 112/62 94/64 122/64   Heart Rate (Admit) 72 bpm 68 bpm 70 bpm 68 bpm 84 bpm   Heart Rate (Exercise) 84 bpm 92 bpm 95 bpm 89 bpm 129 bpm   Heart Rate (Exit) 73 bpm 76 bpm 65 bpm 64 bpm 71 bpm   Oxygen Saturation (Admit) 99 % -- -- -- --   Oxygen Saturation (Exercise) 96 % -- -- -- --   Rating of Perceived Exertion (Exercise) '12 16 17 17 14   '$ Perceived Dyspnea (Exercise) 1 -- -- -- --   Symptoms SOB none none none none   Comments walk test results second full day of exercise -- -- --   Duration -- Continue with 30 min of aerobic exercise without signs/symptoms of physical distress. Continue with 30 min of aerobic exercise without signs/symptoms of physical distress. Continue with 30 min of aerobic exercise without signs/symptoms of physical distress. Continue with 30 min of aerobic exercise without signs/symptoms of physical distress.   Intensity -- THRR unchanged THRR unchanged THRR unchanged THRR unchanged     Progression   Progression -- Continue to progress workloads to maintain intensity without signs/symptoms of physical distress. Continue to progress workloads to maintain  intensity without signs/symptoms of physical distress. Continue to progress workloads to maintain intensity without signs/symptoms of physical distress. Continue to progress workloads to maintain intensity without signs/symptoms of physical distress.   Average METs -- 2.03 2.3 2.42 3.85     Resistance Training   Training Prescription -- Yes Yes Yes Yes   Weight -- 4 lb 4 lb 4 lb 5 lb   Reps -- 10-15 10-15 10-15 10-15     Interval Training   Interval Training -- No No No No     Treadmill   MPH -- '2 2 2 '$ --   Grade -- 0.5 0.5 0.5 --   Minutes -- '15 15 15 '$ --   METs -- 2.67 2.67 2.67 --     Recumbant Bike   Level -- -- -- 4 --   Minutes -- -- -- 15 --   METs -- -- -- 2.3 --     NuStep   Level -- -- -- 4 4   SPM -- -- -- -- 80   Minutes -- -- -- 15 15   METs -- -- -- 2.3 4     Recumbant Elliptical   Level -- 1 -- -- --   Minutes -- 15 -- -- --   METs -- 1.4 -- -- --     Elliptical   Level -- 1 1 -- --   Speed -- 2 2 -- --   Minutes -- 15 15 -- --     REL-XR   Level -- -- -- 1 --   Minutes -- -- -- 15 --     T5 Nustep   Level -- -- -- 3 --  Minutes -- -- -- 15 --   METs -- -- -- 2 --     Biostep-RELP   Level -- -- -- -- 4   SPM -- -- -- -- 50   Minutes -- -- -- -- 15   METs -- -- -- -- 3     Home Exercise Plan   Plans to continue exercise at -- -- -- Home (comment)  walking Home (comment)  walking   Frequency -- -- -- Add 2 additional days to program exercise sessions. Add 2 additional days to program exercise sessions.   Initial Home Exercises Provided -- -- -- 08/03/19 08/03/19   Row Name 09/08/19 1300 09/22/19 1300           Response to Exercise   Blood Pressure (Admit) 126/56 110/72      Blood Pressure (Exercise) 126/70 152/70      Blood Pressure (Exit) 96/54 104/66      Heart Rate (Admit) 69 bpm 71 bpm      Heart Rate (Exercise) 93 bpm 88 bpm      Heart Rate (Exit) 67 bpm 64 bpm      Rating of Perceived Exertion (Exercise) 17 15      Symptoms  fatigue on elliptical --      Duration Continue with 30 min of aerobic exercise without signs/symptoms of physical distress. Continue with 30 min of aerobic exercise without signs/symptoms of physical distress.      Intensity THRR unchanged THRR unchanged        Progression   Progression Continue to progress workloads to maintain intensity without signs/symptoms of physical distress. Continue to progress workloads to maintain intensity without signs/symptoms of physical distress.      Average METs 3.13 2.75        Resistance Training   Training Prescription Yes Yes      Weight 5 lb 5 lb      Reps 10-15 10-15        Interval Training   Interval Training No No        Treadmill   MPH 2.4 2.6      Grade 0.5 0.5      Minutes 15 15      METs 3 3.17        NuStep   Level -- 5      SPM -- 80      Minutes -- 15      METs -- 2.3        Elliptical   Level 1 --      Speed 2 --      Minutes 15 --        REL-XR   Level 5 --      Minutes 15 --      METs 4.1 --        T5 Nustep   Level 3 --      Minutes 15 --      METs 2.4 --        Home Exercise Plan   Plans to continue exercise at Home (comment)  walking Home (comment)  walking      Frequency Add 2 additional days to program exercise sessions. Add 2 additional days to program exercise sessions.      Initial Home Exercises Provided 08/03/19 08/03/19             Exercise Comments:  Exercise Comments    Row Name 07/06/19 1402  Exercise Comments First full day of exercise!  Patient was oriented to gym and equipment including functions, settings, policies, and procedures.  Patient's individual exercise prescription and treatment plan were reviewed.  All starting workloads were established based on the results of the 6 minute walk test done at initial orientation visit.  The plan for exercise progression was also introduced and progression will be customized based on patient's performance and goals.               Exercise Goals and Review:  Exercise Goals    Row Name 07/02/19 1244             Exercise Goals   Increase Physical Activity Yes       Intervention Provide advice, education, support and counseling about physical activity/exercise needs.;Develop an individualized exercise prescription for aerobic and resistive training based on initial evaluation findings, risk stratification, comorbidities and participant's personal goals.       Expected Outcomes Long Term: Add in home exercise to make exercise part of routine and to increase amount of physical activity.;Long Term: Exercising regularly at least 3-5 days a week.;Short Term: Attend rehab on a regular basis to increase amount of physical activity.       Increase Strength and Stamina Yes       Intervention Provide advice, education, support and counseling about physical activity/exercise needs.;Develop an individualized exercise prescription for aerobic and resistive training based on initial evaluation findings, risk stratification, comorbidities and participant's personal goals.       Expected Outcomes Short Term: Increase workloads from initial exercise prescription for resistance, speed, and METs.;Short Term: Perform resistance training exercises routinely during rehab and add in resistance training at home;Long Term: Improve cardiorespiratory fitness, muscular endurance and strength as measured by increased METs and functional capacity (6MWT)       Able to understand and use rate of perceived exertion (RPE) scale Yes       Intervention Provide education and explanation on how to use RPE scale       Expected Outcomes Short Term: Able to use RPE daily in rehab to express subjective intensity level;Long Term:  Able to use RPE to guide intensity level when exercising independently       Able to understand and use Dyspnea scale Yes       Intervention Provide education and explanation on how to use Dyspnea scale       Expected Outcomes Short  Term: Able to use Dyspnea scale daily in rehab to express subjective sense of shortness of breath during exertion;Long Term: Able to use Dyspnea scale to guide intensity level when exercising independently       Knowledge and understanding of Target Heart Rate Range (THRR) Yes       Intervention Provide education and explanation of THRR including how the numbers were predicted and where they are located for reference       Expected Outcomes Short Term: Able to state/look up THRR;Short Term: Able to use daily as guideline for intensity in rehab;Long Term: Able to use THRR to govern intensity when exercising independently       Able to check pulse independently Yes       Intervention Provide education and demonstration on how to check pulse in carotid and radial arteries.;Review the importance of being able to check your own pulse for safety during independent exercise       Expected Outcomes Short Term: Able to explain why pulse checking is important during independent exercise;Long  Term: Able to check pulse independently and accurately       Understanding of Exercise Prescription Yes       Intervention Provide education, explanation, and written materials on patient's individual exercise prescription       Expected Outcomes Short Term: Able to explain program exercise prescription;Long Term: Able to explain home exercise prescription to exercise independently              Exercise Goals Re-Evaluation :  Exercise Goals Re-Evaluation    Row Name 07/06/19 1403 07/14/19 1558 07/28/19 1339 08/05/19 1409 08/11/19 1543     Exercise Goal Re-Evaluation   Exercise Goals Review Knowledge and understanding of Target Heart Rate Range (THRR);Able to understand and use rate of perceived exertion (RPE) scale;Understanding of Exercise Prescription Increase Physical Activity;Increase Strength and Stamina;Understanding of Exercise Prescription Increase Physical Activity;Increase Strength and Stamina;Understanding  of Exercise Prescription Increase Physical Activity;Increase Strength and Stamina;Understanding of Exercise Prescription;Able to understand and use rate of perceived exertion (RPE) scale;Knowledge and understanding of Target Heart Rate Range (THRR);Able to check pulse independently Increase Physical Activity;Increase Strength and Stamina;Understanding of Exercise Prescription   Comments Reviewed RPE and dyspnea scales, THR and program prescription with pt today.  Pt voiced understanding and was given a copy of goals to take home. Devin [name] is off to a good start with rehab with his first two full day sessions.  He is already on the elliptical and doing well.  He has added 0.5% grade.  We will continue to monitor his progress. Devin [name] is up to 6 min on the ellipitical.  Staff will have him progress slowly to 15 min. Reviewed home exercise with pt today.  Pt plans to walk at home and use [employer] for exercise.  Reviewed THR, pulse, RPE, sign and symptoms, NTG use, and when to call 911 or MD.  Also discussed weather considerations and indoor options.  Pt voiced understanding. Devin [name] has been doing well in rehab.  He is up to level 4 on the NuStep and has asked to switch off the elliptical as it is too hard for him.  We have rearranged his equipment.  We will continue to montior his progress.   Expected Outcomes Short: Use RPE daily to regulate intensity. Long: Follow program prescription in THR. Short: Continue to increase workloads  Long: Continue to follow program prescription. Short: work on extending time on elliptical Long:  improve overall stamina Short: add one day in adition to program sessions Long: exercise on his own Short: Try out new equipment rotation  Long: Continue to improve stamina   Row Name 08/19/19 1345 08/25/19 1456 09/08/19 1329 09/09/19 1338 09/22/19 1320     Exercise Goal Re-Evaluation   Exercise Goals Review Increase Physical Activity;Increase Strength and Stamina;Understanding of Exercise Prescription  Increase Physical Activity;Increase Strength and Stamina;Understanding of Exercise Prescription Increase Physical Activity;Increase Strength and Stamina;Understanding of Exercise Prescription Increase Physical Activity;Increase Strength and Stamina;Understanding of Exercise Prescription Increase Physical Activity;Increase Strength and Stamina;Understanding of Exercise Prescription   Comments Devin [name] has been doing well in rehab.  He is walking with his dog at home a 1/4 mile each day.  We talked about extending this time out some.  He does note he has some more stamina overall. Devin [name] is making steady progress.  He has increased to 2.4 mph on TM and 5 lb weights for strength. Devin [name] continues to do well in rehab.  He has decided to drop back to twice a week due to his schedule.  He was  able to get back on the ellipitcal for the first time yesterday and actually able to do 12 min!!!  We will continue to monitor his progress. Devin [name] is doing well and continuing to walk on his off days. He is walking an extra day outside of rehab, he is interested in  adding more days on to his schedule. He walks for about 1 hour and plans to walk in the mall on days that are too hot. Devin [name] is making steady progress.  he has increase to 2.6 mph on TM.   Expected Outcomes Short: Walk longer at home Long; Continue to improve stamina. Short: continue to increase workloads Long :  improve MET level Short: Continue to improve stamina on the elliptical Long: Continue to improve stamina. Short: Add more walking to weekly routines Long: Continue to increase strength/endurace, progress MET level Short: attend consistently Long:  increase stamina          Discharge Exercise Prescription (Final Exercise Prescription Changes):  Exercise Prescription Changes - 09/22/19 1300      Response to Exercise   Blood Pressure (Admit) 110/72    Blood Pressure (Exercise) 152/70    Blood Pressure (Exit) 104/66    Heart Rate (Admit) 71 bpm    Heart Rate (Exercise)  88 bpm    Heart Rate (Exit) 64 bpm    Rating of Perceived Exertion (Exercise) 15    Duration Continue with 30 min of aerobic exercise without signs/symptoms of physical distress.    Intensity THRR unchanged      Progression   Progression Continue to progress workloads to maintain intensity without signs/symptoms of physical distress.    Average METs 2.75      Resistance Training   Training Prescription Yes    Weight 5 lb    Reps 10-15      Interval Training   Interval Training No      Treadmill   MPH 2.6    Grade 0.5    Minutes 15    METs 3.17      NuStep   Level 5    SPM 80    Minutes 15    METs 2.3      Home Exercise Plan   Plans to continue exercise at Home (comment)   walking   Frequency Add 2 additional days to program exercise sessions.    Initial Home Exercises Provided 08/03/19           Nutrition:  Target Goals: Understanding of nutrition guidelines, daily intake of sodium '1500mg'$ , cholesterol '200mg'$ , calories 30% from fat and 7% or less from saturated fats, daily to have 5 or more servings of fruits and vegetables.  Education: Controlling Sodium/Reading Food Labels -Group verbal and written material supporting the discussion of sodium use in heart healthy nutrition. Review and explanation with models, verbal and written materials for utilization of the food label.   Education: General Nutrition Guidelines/Fats and Fiber: -Group instruction provided by verbal, written material, models and posters to present the general guidelines for heart healthy nutrition. Gives an explanation and review of dietary fats and fiber.   Biometrics:  Pre Biometrics - 07/02/19 1254      Pre Biometrics   Height 5' 11.1" (1.806 m)    Weight 208 lb 14.4 oz (94.8 kg)    BMI (Calculated) 29.05    Single Leg Stand 14.6 seconds            Nutrition Therapy Plan and Nutrition Goals:  Nutrition Therapy & Goals -  07/15/19 1309      Nutrition Therapy   Diet Low Na, Heart  Healthy, Diabetes Friendly    Protein (specify units) 75-80g    Fiber 30 grams    Whole Grain Foods 3 servings    Saturated Fats 12 max. grams    Fruits and Vegetables 5 servings/day    Sodium 1.5 grams      Personal Nutrition Goals   Nutrition Goal ST: substantial snack before and after exercise (bean salad) and bring applesauce to rehab LT: Eat for heart and diabetes    Comments Last a1c was 6. Doesn't eat lunch normally, discussed consistant eating for diabetes. Discussed heart healthy eating and diabetes friendly eating. Pt reports eating mostly heart healthy. Breakfast is cheerios with banana, blueberries, walnuts, and milk.      Intervention Plan   Intervention Prescribe, educate and counsel regarding individualized specific dietary modifications aiming towards targeted core components such as weight, hypertension, lipid management, diabetes, heart failure and other comorbidities.;Nutrition handout(s) given to patient.    Expected Outcomes Short Term Goal: Understand basic principles of dietary content, such as calories, fat, sodium, cholesterol and nutrients.;Short Term Goal: A plan has been developed with personal nutrition goals set during dietitian appointment.;Long Term Goal: Adherence to prescribed nutrition plan.           Nutrition Assessments:  Nutrition Assessments - 07/02/19 1258      MEDFICTS Scores   Pre Score 47           MEDIFICTS Score Key:          ?70 Need to make dietary changes          40-70 Heart Healthy Diet         ? 40 Therapeutic Level Cholesterol Diet  Nutrition Goals Re-Evaluation:  Nutrition Goals Re-Evaluation    [place] Name 09/09/19 1355             Goals   Nutrition Goal ST: substantial snack before and after exercise (bean salad) and bring applesauce to rehab LT: Eat for heart and diabetes       Comment Continue to follow RD changes and recommendations       Expected Outcome Short: Follow regular changes Long: Maintain heart healthy  diet              Nutrition Goals Discharge (Final Nutrition Goals Re-Evaluation):  Nutrition Goals Re-Evaluation - 09/09/19 1355      Goals   Nutrition Goal ST: substantial snack before and after exercise (bean salad) and bring applesauce to rehab LT: Eat for heart and diabetes    Comment Continue to follow RD changes and recommendations    Expected Outcome Short: Follow regular changes Long: Maintain heart healthy diet           Psychosocial: Target Goals: Acknowledge presence or absence of significant depression and/or stress, maximize coping skills, provide positive support system. Participant is able to verbalize types and ability to use techniques and skills needed for reducing stress and depression.   Education: Depression - Provides group verbal and written instruction on the correlation between heart/lung disease and depressed mood, treatment options, and the stigmas associated with seeking treatment.   Education: Sleep Hygiene -Provides group verbal and written instruction about how sleep can affect your health.  Define sleep hygiene, discuss sleep cycles and impact of sleep habits. Review good sleep hygiene tips.     Education: Stress and Anxiety: - Provides group verbal and written instruction about the health risks  of elevated stress and causes of high stress.  Discuss the correlation between heart/lung disease and anxiety and treatment options. Review healthy ways to manage with stress and anxiety.    Initial Review & Psychosocial Screening:  Initial Psych Review & Screening - 07/01/19 1115      Initial Review   Current issues with Current Sleep Concerns      Family Dynamics   Good Support System? No   Brothers in [place], neighbors are here,   Strains --   No family lives nearby 2 brothers in [place].   Comments Sleep concerns are chronic.      Barriers   Psychosocial barriers to participate in program There are no identifiable barriers or psychosocial  needs.;The patient should benefit from training in stress management and relaxation.      Screening Interventions   Interventions Encouraged to exercise    Expected Outcomes Short Term goal: Utilizing psychosocial counselor, staff and physician to assist with identification of specific Stressors or current issues interfering with healing process. Setting desired goal for each stressor or current issue identified.;Long Term Goal: Stressors or current issues are controlled or eliminated.;Short Term goal: Identification and review with participant of any Quality of Life or Depression concerns found by scoring the questionnaire.;Long Term goal: The participant improves quality of Life and PHQ9 Scores as seen by post scores and/or verbalization of changes           Quality of Life Scores:   Quality of Life - 07/02/19 1257      Quality of Life   Select Quality of Life      Quality of Life Scores   Health/Function Pre 26.57 %    Socioeconomic Pre 22.63 %    Psych/Spiritual Pre 29.14 %    Family Pre 17.67 %    GLOBAL Pre 25.72 %          Scores of 19 and below usually indicate a poorer quality of life in these areas.  A difference of  2-3 points is a clinically meaningful difference.  A difference of 2-3 points in the total score of the Quality of Life Index has been associated with significant improvement in overall quality of life, self-image, physical symptoms, and general health in studies assessing change in quality of life.  PHQ-9: Recent Review Flowsheet Data    Depression screen [facility] 2/9 09/09/2019 07/02/2019   Decreased Interest 1 2   Down, Depressed, Hopeless 0 1   PHQ - 2 Score 1 3   Altered sleeping 3 3   Tired, decreased energy 0 2   Change in appetite 0 1   Feeling bad or failure about yourself  0 0   Trouble concentrating 0 0   Moving slowly or fidgety/restless 0 0   Suicidal thoughts 0 0   PHQ-9 Score 4 9   Difficult doing work/chores Not difficult at all Not difficult  at all     Interpretation of Total Score  Total Score Depression Severity:  1-4 = Minimal depression, 5-9 = Mild depression, 10-14 = Moderate depression, 15-19 = Moderately severe depression, 20-27 = Severe depression   Psychosocial Evaluation and Intervention:  Psychosocial Evaluation - 07/01/19 1141      Psychosocial Evaluation & Interventions   Interventions Encouraged to exercise with the program and follow exercise prescription    Comments Devin [name] no barriers to attending the program.  He lives alone with his 58 year old dog.  HIs wife passed away 4 years ago. He  does not have any family in [place]. He did state that he could call on his neighbors if he needed help. He is looking forward to attending the progran.    Expected Outcomes STG: Attends the progam on a consistent basis  LTG: Maintains his healthy lifestyle developed while in the program    Continue Psychosocial Services  Follow up required by staff           Psychosocial Re-Evaluation:  Psychosocial Re-Evaluation    [place] Name 08/19/19 1349 09/09/19 1353           Psychosocial Re-Evaluation   Current issues with Current Sleep Concerns;Current Stress Concerns Current Sleep Concerns;Current Stress Concerns      Comments Devin [name] is doing better mentally.  He is sleeping better now.  He enjoys getting out to meet people and coming to class. Devin [name] reports he doing well mentally. He is not sleeping to well due to his sleep apnea but thinks it has been better since the beginning. Denies any big stressors in his life. Stays compliant with antidepressants and reports making him feel well. PHQ imoroved by 5 points since last time!      Expected Outcomes Short: Continue to attend rehab for mental boost Long; Continue to stay positive Short: Focus on maintaining regular sleep patterns, continue to exercise for mental health Long: Maintain positive attitude      Interventions Encouraged to attend Cardiac Rehabilitation for the exercise  Encouraged to attend Cardiac Rehabilitation for the exercise      Continue Psychosocial Services  Follow up required by staff Follow up required by staff             Psychosocial Discharge (Final Psychosocial Re-Evaluation):  Psychosocial Re-Evaluation - 09/09/19 1353      Psychosocial Re-Evaluation   Current issues with Current Sleep Concerns;Current Stress Concerns    Comments Devin [name] reports he doing well mentally. He is not sleeping to well due to his sleep apnea but thinks it has been better since the beginning. Denies any big stressors in his life. Stays compliant with antidepressants and reports making him feel well. PHQ imoroved by 5 points since last time!    Expected Outcomes Short: Focus on maintaining regular sleep patterns, continue to exercise for mental health Long: Maintain positive attitude    Interventions Encouraged to attend Cardiac Rehabilitation for the exercise    Continue Psychosocial Services  Follow up required by staff           Vocational Rehabilitation: Provide vocational rehab assistance to qualifying candidates.   Vocational Rehab Evaluation & Intervention:  Vocational Rehab - 07/01/19 1123      Initial Vocational Rehab Evaluation & Intervention   Assessment shows need for Vocational Rehabilitation No           Education: Education Goals: Education classes will be provided on a variety of topics geared toward better understanding of heart health and risk factor modification. Participant will state understanding/return demonstration of topics presented as noted by education test scores.  Learning Barriers/Preferences:  Learning Barriers/Preferences - 07/01/19 1122      Learning Barriers/Preferences   Learning Barriers None    Learning Preferences None           General Cardiac Education Topics:  AED/CPR: - Group verbal and written instruction with the use of models to demonstrate the basic use of the AED with the basic ABC's of  resuscitation.   Anatomy & Physiology of the Heart: - Group verbal and written instruction and  models provide basic cardiac anatomy and physiology, with the coronary electrical and arterial systems. Review of Valvular disease and Heart Failure   Cardiac Procedures: - Group verbal and written instruction to review commonly prescribed medications for heart disease. Reviews the medication, class of the drug, and side effects. Includes the steps to properly store meds and maintain the prescription regimen. (beta blockers and nitrates)   Cardiac Medications I: - Group verbal and written instruction to review commonly prescribed medications for heart disease. Reviews the medication, class of the drug, and side effects. Includes the steps to properly store meds and maintain the prescription regimen.   Cardiac Medications II: -Group verbal and written instruction to review commonly prescribed medications for heart disease. Reviews the medication, class of the drug, and side effects. (all other drug classes)    Go Sex-Intimacy & Heart Disease, Get SMART - Goal Setting: - Group verbal and written instruction through game format to discuss heart disease and the return to sexual intimacy. Provides group verbal and written material to discuss and apply goal setting through the application of the S.M.A.R.T. Method.   Other Matters of the Heart: - Provides group verbal, written materials and models to describe Stable Angina and Peripheral Artery. Includes description of the disease process and treatment options available to the cardiac patient.   Infection Prevention: - Provides verbal and written material to individual with discussion of infection control including proper hand washing and proper equipment cleaning during exercise session.   Falls Prevention: - Provides verbal and written material to individual with discussion of falls prevention and safety.   Other: -Provides group and verbal  instruction on various topics (see comments)   Knowledge Questionnaire Score:  Knowledge Questionnaire Score - 07/02/19 1258      Knowledge Questionnaire Score   Pre Score 24/26 Education Focus: Nutrition, Exercise           Core Components/Risk Factors/Patient Goals at Admission:  Personal Goals and Risk Factors at Admission - 07/02/19 1259      Core Components/Risk Factors/Patient Goals on Admission    Weight Management Yes;Weight Loss    Intervention Weight Management: Develop a combined nutrition and exercise program designed to reach desired caloric intake, while maintaining appropriate intake of nutrient and fiber, sodium and fats, and appropriate energy expenditure required for the weight goal.;Weight Management: Provide education and appropriate resources to help participant work on and attain dietary goals.    Admit Weight 208 lb 14.4 oz (94.8 kg)    Goal Weight: Short Term 203 lb (92.1 kg)    Goal Weight: Long Term 198 lb (89.8 kg)    Expected Outcomes Short Term: Continue to assess and modify interventions until short term weight is achieved;Long Term: Adherence to nutrition and physical activity/exercise program aimed toward attainment of established weight goal;Weight Loss: Understanding of general recommendations for a balanced deficit meal plan, which promotes 1-2 lb weight loss per week and includes a negative energy balance of [phone] kcal/d;Understanding recommendations for meals to include 15-35% energy as protein, 25-35% energy from fat, 35-60% energy from carbohydrates, less than '200mg'$  of dietary cholesterol, 20-35 gm of total fiber daily;Understanding of distribution of calorie intake throughout the day with the consumption of 4-5 meals/snacks    Diabetes Yes    Intervention Provide education about signs/symptoms and action to take for hypo/hyperglycemia.;Provide education about proper nutrition, including hydration, and aerobic/resistive exercise prescription along  with prescribed medications to achieve blood glucose in normal ranges: Fasting glucose 65-99 mg/dL  Expected Outcomes Short Term: Participant verbalizes understanding of the signs/symptoms and immediate care of hyper/hypoglycemia, proper foot care and importance of medication, aerobic/resistive exercise and nutrition plan for blood glucose control.;Long Term: Attainment of HbA1C < 7%.    Hypertension Yes    Intervention Provide education on lifestyle modifcations including regular physical activity/exercise, weight management, moderate sodium restriction and increased consumption of fresh fruit, vegetables, and low fat dairy, alcohol moderation, and smoking cessation.;Monitor prescription use compliance.    Expected Outcomes Short Term: Continued assessment and intervention until BP is < 140/68m HG in hypertensive participants. < 130/846mHG in hypertensive participants with diabetes, heart failure or chronic kidney disease.;Long Term: Maintenance of blood pressure at goal levels.    Lipids Yes    Intervention Provide education and support for participant on nutrition & aerobic/resistive exercise along with prescribed medications to achieve LDL '70mg'$ , HDL >'40mg'$ .    Expected Outcomes Short Term: Participant states understanding of desired cholesterol values and is compliant with medications prescribed. Participant is following exercise prescription and nutrition guidelines.;Long Term: Cholesterol controlled with medications as prescribed, with individualized exercise RX and with personalized nutrition plan. Value goals: LDL < '70mg'$ , HDL > 40 mg.           Education:Diabetes - Individual verbal and written instruction to review signs/symptoms of diabetes, desired ranges of glucose level fasting, after meals and with exercise. Acknowledge that pre and post exercise glucose checks will be done for 3 sessions at entry of program.   Education: Know Your Numbers and Risk Factors: -Group verbal and  written instruction about important numbers in your health.  Discussion of what are risk factors and how they play a role in the disease process.  Review of Cholesterol, Blood Pressure, Diabetes, and BMI and the role they play in your overall health.   Core Components/Risk Factors/Patient Goals Review:   Goals and Risk Factor Review    Row Name 08/19/19 1350 09/09/19 1343           Core Components/Risk Factors/Patient Goals Review   Personal Goals Review Weight Management/Obesity;Hypertension;Diabetes;Lipids Weight Management/Obesity;Hypertension;Diabetes;Lipids      Review Devin [name] is doing well in rehab.  His weight is trending down.  He keeps a good eye on his pressures and sugars.  His pressures have continued to do well.  When he first started, his sugars were dropping with exercise, now he has made changes and is staying stable. Devin [name] would like to lose weight but is struggling to do so as he said his bowel movements are not regular, he plans to talk to his doctor about it at his upcoming appointment. Reports that he takes his blood sugars at home but does not do it as much as he would like to, reports normal ranges at home. He will plan to check more often and spoke about setting up alarms to remind him. Devin [name] states his BPs have been stable here at rehab. He received a recent blood work and reports his A1C went down!      Expected Outcomes Short: Continue to work on weight loss Long; Continue to manage diabetes better. Short: Continue to check BG more often, focus on losing weight/speaking with doctor Long: Manage risk factors             Core Components/Risk Factors/Patient Goals at Discharge (Final Review):   Goals and Risk Factor Review - 09/09/19 1343      Core Components/Risk Factors/Patient Goals Review   Personal Goals Review Weight Management/Obesity;Hypertension;Diabetes;Lipids  Review Devin [name] would like to lose weight but is struggling to do so as he said his bowel movements are not  regular, he plans to talk to his doctor about it at his upcoming appointment. Reports that he takes his blood sugars at home but does not do it as much as he would like to, reports normal ranges at home. He will plan to check more often and spoke about setting up alarms to remind him. Devin [name] states his BPs have been stable here at rehab. He received a recent blood work and reports his A1C went down!    Expected Outcomes Short: Continue to check BG more often, focus on losing weight/speaking with doctor Long: Manage risk factors           ITP Comments:  ITP Comments    Row Name 07/01/19 1139 07/02/19 1239 07/06/19 1402 07/29/19 6979 08/26/19 0629   ITP Comments Virtual Orientation completed . Has EP Eval and Gym Orientation scheduled for tomorrow.  Documentation of diagnosis [facility] be found in [facility] 05/22/2019 Completed 6MWT and gym orientation.  Initial ITP created and sent for review to Dr. Emily Filbert, Medical Director. First full day of exercise!  Patient was oriented to gym and equipment including functions, settings, policies, and procedures.  Patient's individual exercise prescription and treatment plan were reviewed.  All starting workloads were established based on the results of the 6 minute walk test done at initial orientation visit.  The plan for exercise progression was also introduced and progression will be customized based on patient's performance and goals. 30 Day review completed. ITP review done, changes made as directed,and approval shown by signature of  Scientist, research (life sciences). 30 Day review completed. Medical Director ITP review done, changes made as directed, and signed approval by Medical Director.   [place] Name 09/23/19 1053           ITP Comments 30 Day review completed. Medical Director ITP review done, changes made as directed, and signed approval by Medical Director.              Comments:

## 2019-09-23 NOTE — Progress Notes (Signed)
Daily Session Note  Patient Details  Name: Devin Frank Wieand Jr. MRN: [id number] Date of Birth: 10/08/1945 Referring Provider:     Cardiac Rehab from 07/02/2019 in [facility]  Referring Provider Khan, Shaukat MD      Encounter Date: 09/23/2019  Check In:  Session Check In - 09/23/19 1413      Check-In   Supervising physician immediately available to respond to emergencies See telemetry face sheet for immediately available ER MD    Location [facility]    Staff Present  , RN BSN;Joseph Hood RCP,RRT,BSRT;Kara Langdon, MS Exercise Physiologist    Virtual Visit No    Medication changes reported     No    Fall or balance concerns reported    No    Warm-up and Cool-down Performed on first and last piece of equipment    Resistance Training Performed Yes    VAD Patient? No    PAD/SET Patient? No      Pain Assessment   Currently in Pain? No/denies              Social History   Tobacco Use  Smoking Status Former Smoker  Smokeless Tobacco Never Used  Tobacco Comment   Quit over 40 years ago    Goals Met:  Independence with exercise equipment Exercise tolerated well No report of cardiac concerns or symptoms Strength training completed today  Goals Unmet:  Not Applicable  Comments: Pt able to follow exercise prescription today without complaint.  Will continue to monitor for progression.    Dr. Mark Miller is Medical Director for [facility] and [facility]. 

## 2019-09-28 ENCOUNTER — Other Ambulatory Visit: Payer: Self-pay

## 2019-09-28 ENCOUNTER — Encounter: Payer: Medicare HMO | Admitting: *Deleted

## 2019-09-28 DIAGNOSIS — Z951 Presence of aortocoronary bypass graft: Secondary | ICD-10-CM | POA: Diagnosis not present

## 2019-09-28 NOTE — Progress Notes (Signed)
Daily Session Note  Patient Details  Name: Greysyn [facility]. MRN: [id number] Date of Birth: 01-Mar-1946 Referring Provider:     Cardiac Rehab from 07/02/2019 in [facility]  Referring Provider Neoma Laming MD      Encounter Date: 09/28/2019  Check In:  Session Check In - 09/28/19 1407      Check-In   Supervising physician immediately available to respond to emergencies See telemetry face sheet for immediately available ER MD    Location [facility]    Staff Present Renita Papa, RN Margurite Auerbach, MS Exercise Physiologist;Kelly Amedeo Plenty, BS, ACSM CEP, Exercise Physiologist    Virtual Visit No    Medication changes reported     No    Fall or balance concerns reported    No    Warm-up and Cool-down Performed on first and last piece of equipment    Resistance Training Performed Yes    VAD Patient? No    PAD/SET Patient? No      Pain Assessment   Currently in Pain? No/denies              Social History   Tobacco Use  Smoking Status Former Smoker  Smokeless Tobacco Never Used  Tobacco Comment   Quit over 40 years ago    Goals Met:  Independence with exercise equipment Exercise tolerated well No report of cardiac concerns or symptoms Strength training completed today  Goals Unmet:  Not Applicable  Comments: Pt able to follow exercise prescription today without complaint.  Will continue to monitor for progression.    Dr. Emily Filbert is Medical Director for [place] and [facility].

## 2019-10-05 ENCOUNTER — Encounter: Payer: Medicare HMO | Admitting: *Deleted

## 2019-10-05 ENCOUNTER — Other Ambulatory Visit: Payer: Self-pay

## 2019-10-05 DIAGNOSIS — Z951 Presence of aortocoronary bypass graft: Secondary | ICD-10-CM

## 2019-10-05 NOTE — Progress Notes (Signed)
Daily Session Note  Patient Details  Name: Devin Frank Garofano Jr. MRN: [id number] Date of Birth: 11/17/1945 Referring Provider:     Cardiac Rehab from 07/02/2019 in [facility]  Referring Provider Khan, Shaukat MD      Encounter Date: 10/05/2019  Check In:  Session Check In - 10/05/19 1400      Check-In   Supervising physician immediately available to respond to emergencies See telemetry face sheet for immediately available ER MD    Location [facility]    Staff Present  , RN BSN;Joseph Hood RCP,RRT,BSRT;Kelly Hayes, BS, ACSM CEP, Exercise Physiologist;Amanda Sommer, BA, ACSM CEP, Exercise Physiologist    Virtual Visit No    Medication changes reported     No    Fall or balance concerns reported    No    Warm-up and Cool-down Performed on first and last piece of equipment    Resistance Training Performed Yes    VAD Patient? No    PAD/SET Patient? No      Pain Assessment   Currently in Pain? No/denies              Social History   Tobacco Use  Smoking Status Former Smoker  Smokeless Tobacco Never Used  Tobacco Comment   Quit over 40 years ago    Goals Met:  Independence with exercise equipment Exercise tolerated well No report of cardiac concerns or symptoms Strength training completed today  Goals Unmet:  Not Applicable  Comments: Pt able to follow exercise prescription today without complaint.  Will continue to monitor for progression.    Dr. Mark Miller is Medical Director for [facility] and [facility]. 

## 2019-10-07 ENCOUNTER — Other Ambulatory Visit: Payer: Self-pay

## 2019-10-07 ENCOUNTER — Encounter: Payer: Medicare HMO | Admitting: *Deleted

## 2019-10-07 DIAGNOSIS — Z951 Presence of aortocoronary bypass graft: Secondary | ICD-10-CM | POA: Diagnosis not present

## 2019-10-07 NOTE — Progress Notes (Signed)
Daily Session Note  Patient Details  Name: Devin [name]. MRN: [id number] Date of Birth: [birth year] Referring Provider:     Cardiac Rehab from 07/02/2019 in [facility]  Referring Provider Neoma Laming MD      Encounter Date: 10/07/2019  Check In:  Session Check In - 10/07/19 1358      Check-In   Supervising physician immediately available to respond to emergencies See telemetry face sheet for immediately available ER MD    Location [facility]    Staff Present Renita Papa, RN BSN;Joseph Hood RCP,RRT,BSRT;Laureen Owens Shark, BS, RRT, CPFT;Jessica Orange, [place], RCEP, CCRP, CCET    Virtual Visit No    Medication changes reported     No    Fall or balance concerns reported    No    Warm-up and Cool-down Performed on first and last piece of equipment    Resistance Training Performed Yes    VAD Patient? No    PAD/SET Patient? No      Pain Assessment   Currently in Pain? No/denies              Social History   Tobacco Use  Smoking Status Former Smoker  Smokeless Tobacco Never Used  Tobacco Comment   Quit over 40 years ago    Goals Met:  Independence with exercise equipment Exercise tolerated well No report of cardiac concerns or symptoms Strength training completed today  Goals Unmet:  Not Applicable  Comments: Pt able to follow exercise prescription today without complaint.  Will continue to monitor for progression.    Dr. Emily Filbert is Medical Director for [place] and [facility].

## 2019-10-12 ENCOUNTER — Other Ambulatory Visit: Payer: Self-pay

## 2019-10-12 ENCOUNTER — Encounter: Payer: Medicare HMO | Attending: Cardiovascular Disease | Admitting: *Deleted

## 2019-10-12 DIAGNOSIS — Z87891 Personal history of nicotine dependence: Secondary | ICD-10-CM | POA: Diagnosis not present

## 2019-10-12 DIAGNOSIS — Z951 Presence of aortocoronary bypass graft: Secondary | ICD-10-CM | POA: Diagnosis present

## 2019-10-12 NOTE — Progress Notes (Signed)
Daily Session Note  Patient Details  Name: Devin [name] [facility]. MRN: [id number] Date of Birth: 09-22-1945 Referring Provider:     Cardiac Rehab from 07/02/2019 in [facility] Cardiac and Pulmonary Rehab  Referring Provider Neoma Laming MD      Encounter Date: 10/12/2019  Check In:  Session Check In - 10/12/19 1402      Check-In   Supervising physician immediately available to respond to emergencies See telemetry face sheet for immediately available ER MD    Location [facility]-Cardiac & Pulmonary Rehab    Staff Present Renita Papa, RN Margurite Auerbach, MS Exercise Physiologist;Kelly Amedeo Plenty, BS, ACSM CEP, Exercise Physiologist    Virtual Visit No    Medication changes reported     No    Fall or balance concerns reported    No    Warm-up and Cool-down Performed on first and last piece of equipment    Resistance Training Performed Yes    VAD Patient? No    PAD/SET Patient? No      Pain Assessment   Currently in Pain? No/denies              Social History   Tobacco Use  Smoking Status Former Smoker  Smokeless Tobacco Never Used  Tobacco Comment   Quit over 40 years ago    Goals Met:  Independence with exercise equipment Exercise tolerated well No report of cardiac concerns or symptoms Strength training completed today  Goals Unmet:  Not Applicable  Comments: Pt able to follow exercise prescription today without complaint.  Will continue to monitor for progression.    Dr. Emily Filbert is Medical Director for [place] and [facility].

## 2019-10-19 ENCOUNTER — Other Ambulatory Visit: Payer: Self-pay

## 2019-10-19 ENCOUNTER — Encounter: Payer: Medicare HMO | Admitting: *Deleted

## 2019-10-19 DIAGNOSIS — Z951 Presence of aortocoronary bypass graft: Secondary | ICD-10-CM | POA: Diagnosis not present

## 2019-10-19 NOTE — Progress Notes (Signed)
Daily Session Note  Patient Details  Name: Devin [name] [facility]. MRN: [id number] Date of Birth: 1946/01/03 Referring Provider:     Cardiac Rehab from 07/02/2019 in [facility] Cardiac and Pulmonary Rehab  Referring Provider Neoma Laming MD      Encounter Date: 10/19/2019  Check In:  Session Check In - 10/19/19 1402      Check-In   Supervising physician immediately available to respond to emergencies See telemetry face sheet for immediately available ER MD    Location [facility]-Cardiac & Pulmonary Rehab    Staff Present Renita Papa, RN Margurite Auerbach, MS Exercise Physiologist;Jessica Luan Pulling, MA, RCEP, CCRP, CCET    Virtual Visit No    Medication changes reported     Yes    Comments increased bp meds    Fall or balance concerns reported    No    Warm-up and Cool-down Performed on first and last piece of equipment    Resistance Training Performed Yes    VAD Patient? No    PAD/SET Patient? No      Pain Assessment   Currently in Pain? No/denies              Social History   Tobacco Use  Smoking Status Former Smoker  Smokeless Tobacco Never Used  Tobacco Comment   Quit over 40 years ago    Goals Met:  Independence with exercise equipment Exercise tolerated well No report of cardiac concerns or symptoms Strength training completed today  Goals Unmet:  Not Applicable  Comments: Pt able to follow exercise prescription today without complaint.  Will continue to monitor for progression.    Dr. Emily Filbert is Medical Director for [place] [facility].

## 2019-10-21 ENCOUNTER — Encounter: Payer: Medicare HMO | Admitting: *Deleted

## 2019-10-21 ENCOUNTER — Encounter: Payer: Self-pay | Admitting: *Deleted

## 2019-10-21 ENCOUNTER — Other Ambulatory Visit: Payer: Self-pay

## 2019-10-21 DIAGNOSIS — Z951 Presence of aortocoronary bypass graft: Secondary | ICD-10-CM

## 2019-10-21 NOTE — Progress Notes (Signed)
Daily Session Note  Patient Details  Name: Devin [name] [facility]. MRN: [id number] Date of Birth: 1945/10/15 Referring Provider:     Cardiac Rehab from 07/02/2019 in [facility] Cardiac and Pulmonary Rehab  Referring Provider Neoma Laming MD      Encounter Date: 10/21/2019  Check In:  Session Check In - 10/21/19 1400      Check-In   Supervising physician immediately available to respond to emergencies See telemetry face sheet for immediately available ER MD    Location [facility]-Cardiac & Pulmonary Rehab    Staff Present Renita Papa, RN BSN;Joseph Lou Miner, [place] Exercise Physiologist    Virtual Visit No    Medication changes reported     No    Fall or balance concerns reported    No    Warm-up and Cool-down Performed on first and last piece of equipment    Resistance Training Performed Yes    VAD Patient? No    PAD/SET Patient? No      Pain Assessment   Currently in Pain? No/denies              Social History   Tobacco Use  Smoking Status Former Smoker  Smokeless Tobacco Never Used  Tobacco Comment   Quit over 40 years ago    Goals Met:  Independence with exercise equipment Exercise tolerated well No report of cardiac concerns or symptoms Strength training completed today  Goals Unmet:  Not Applicable  Comments: Pt able to follow exercise prescription today without complaint.  Will continue to monitor for progression.    Dr. Emily Filbert is Medical Director for [place] [facility].

## 2019-10-21 NOTE — Progress Notes (Signed)
Cardiac Individual Treatment Plan  Patient Details  Name: Devin [name] [facility]. MRN: [id number] Date of Birth: 1945-05-05 Referring Provider:     Cardiac Rehab from 07/02/2019 in [facility] Cardiac and Pulmonary Rehab  Referring Provider Neoma Laming MD      Initial Encounter Date:    Cardiac Rehab from 07/02/2019 in [facility] Cardiac and Pulmonary Rehab  Date 07/02/19      Visit Diagnosis: S/P CABG x 1  Patient's Home Medications on Admission:  Current Outpatient Medications:  .  acetaminophen (TYLENOL) 500 MG tablet, Take 1,000 mg by mouth at bedtime., Disp: , Rfl:  .  apixaban (ELIQUIS) 5 MG TABS tablet, Take by mouth., Disp: , Rfl:  .  ascorbic acid (VITAMIN C) 500 MG tablet, Take 500 mg by mouth daily., Disp: , Rfl:  .  atorvastatin (LIPITOR) 80 MG tablet, Take 1 tablet (80 mg total) by mouth daily., Disp: 30 tablet, Rfl: 11 .  buPROPion (WELLBUTRIN XL) 300 MG 24 hr tablet, Take by mouth., Disp: , Rfl:  .  carvedilol (COREG) 25 MG tablet, Take 1 tablet (25 mg total) by mouth 2 (two) times daily., Disp: 60 tablet, Rfl: 11 .  Cholecalciferol 125 MCG (5000 UT) TABS, Take 5,000 Units by mouth daily., Disp: , Rfl:  .  Coenzyme Q10 100 MG capsule, Take by mouth., Disp: , Rfl:  .  EPINEPHrine (EPIPEN JR) 0.15 MG/0.3ML injection, Inject into the muscle., Disp: , Rfl:  .  esomeprazole (NEXIUM) 20 MG capsule, Take 20 mg by mouth daily., Disp: , Rfl:  .  ezetimibe (ZETIA) 10 MG tablet, Take 10 mg by mouth daily., Disp: , Rfl:  .  gabapentin (NEURONTIN) 300 MG capsule, Take 300 mg by mouth 2 (two) times daily., Disp: , Rfl:  .  ipratropium (ATROVENT HFA) 17 MCG/ACT inhaler, Inhale into the lungs., Disp: , Rfl:  .  ipratropium (ATROVENT) 0.02 % nebulizer solution, Inhale into the lungs., Disp: , Rfl:  .  isosorbide mononitrate (IMDUR) 30 MG 24 hr tablet, Take 1 tablet (30 mg total) by mouth daily., Disp: 30 tablet, Rfl: 1 .  metFORMIN (GLUCOPHAGE) 500 MG tablet, Take 500 mg by mouth 2 (two) times  daily., Disp: , Rfl:  .  Multiple Vitamin (MULTI-VITAMIN) tablet, Take 1 tablet by mouth daily., Disp: , Rfl:  .  PARoxetine (PAXIL) 30 MG tablet, Take 30 mg by mouth daily., Disp: , Rfl:  No current facility-administered medications for this visit.  Facility-Administered Medications Ordered in Other Visits:  .  sodium chloride flush (NS) 0.9 % injection 3 mL, 3 mL, Intravenous, Q12H, Dionisio David, MD  Past Medical History: Past Medical History:  Diagnosis Date  . CAD (coronary artery disease)   . Diabetes mellitus without complication ([place])   . Hypertension   . MI, old   . 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   Exercise Prescription Goal: Initial exercise prescription builds to 30-45 minutes a day of aerobic  activity, 2-3 days per week.  Home exercise guidelines will be given to patient during program as part of exercise prescription that the participant will acknowledge.   Education: Aerobic Exercise & Resistance Training: - Gives group verbal and written instruction on the various components of exercise. Focuses on aerobic and resistive training programs and the benefits of this training and how to safely progress through these programs..   Education: Exercise & Equipment Safety: - Individual verbal instruction and demonstration of equipment use and safety with use of the equipment.   Education: Exercise Physiology & General Exercise Guidelines: - Group verbal and written instruction with  models to review the exercise physiology of the cardiovascular system and associated critical values. Provides general exercise guidelines with specific guidelines to those with heart or lung disease.    Cardiac Rehab from 10/07/2019 in [facility] Cardiac and Pulmonary Rehab  Date 09/23/19  Educator AS  Instruction Review Code 1- Verbalizes Understanding      Education: Flexibility, Balance, Mind/Body Relaxation: Provides group verbal/written instruction on the benefits of flexibility and balance training, including mind/body exercise modes such as yoga, pilates and tai chi.  Demonstration and skill practice provided.   Activity Barriers & Risk Stratification:  Activity Barriers & Cardiac Risk Stratification - 07/02/19 1240      Activity Barriers & Cardiac Risk Stratification   Activity Barriers Deconditioning;Left Knee Replacement;Right Knee Replacement;Balance Concerns;Joint Problems   some shoulder issues   Cardiac Risk Stratification Moderate           6 Minute Walk:  6 Minute Walk    Row Name 07/02/19 1239         6 Minute Walk   Phase Initial     Distance 1085 feet     Walk Time 6 minutes     # of Rest Breaks 0     MPH 2.05     METS 2.56     RPE 12     Perceived Dyspnea  1     VO2 Peak 7.21     Symptoms Yes (comment)     Comments SOB     Resting HR 72 bpm     Resting BP 104/66     Resting Oxygen Saturation  99 %     Exercise Oxygen Saturation  during 6 min walk 96 %     Max Ex. HR 84 bpm     Max Ex. BP 122/64     2 Minute Post BP 122/56            Oxygen Initial Assessment:   Oxygen Re-Evaluation:   Oxygen Discharge (Final Oxygen Re-Evaluation):   Initial Exercise Prescription:  Initial Exercise Prescription - 07/02/19 1200      Date of Initial Exercise RX and Referring Provider   Date 07/02/19    Referring Provider Neoma Laming MD      Treadmill   MPH 2    Grade 0    Minutes 15    METs 2.53      Recumbant Elliptical   Level 1    RPM 50     Minutes 15    METs 2      Elliptical   Level 1    Speed 2    Minutes 15      Prescription Details   Frequency (times per week) 3    Duration Progress to 30 minutes of continuous aerobic without signs/symptoms of physical distress      Intensity   THRR 40-80% of  Max Heartrate 102-131    Ratings of Perceived Exertion 11-13    Perceived Dyspnea 0-4      Progression   Progression Continue to progress workloads to maintain intensity without signs/symptoms of physical distress.      Resistance Training   Training Prescription Yes    Weight 3 lb    Reps 10-15           Perform Capillary Blood Glucose checks as needed.  Exercise Prescription Changes:  Exercise Prescription Changes    Row Name 07/02/19 1200 07/14/19 1600 07/28/19 1300 08/11/19 1500 08/25/19 1400     Response to Exercise   Blood Pressure (Admit) 104/66 110/60 104/54 100/64 122/66   Blood Pressure (Exercise) 122/64 142/64 128/64 138/54 138/78   Blood Pressure (Exit) 122/56 126/78 112/62 94/64 122/64   Heart Rate (Admit) 72 bpm 68 bpm 70 bpm 68 bpm 84 bpm   Heart Rate (Exercise) 84 bpm 92 bpm 95 bpm 89 bpm 129 bpm   Heart Rate (Exit) 73 bpm 76 bpm 65 bpm 64 bpm 71 bpm   Oxygen Saturation (Admit) 99 % -- -- -- --   Oxygen Saturation (Exercise) 96 % -- -- -- --   Rating of Perceived Exertion (Exercise) _0 Perceived Dyspnea (Exercise) 1 -- -- -- --   Symptoms SOB none none none none   Comments walk test results second full day of exercise -- -- --   Duration -- Continue with 30 min of aerobic exercise without signs/symptoms of physical distress. Continue with 30 min of aerobic exercise without signs/symptoms of physical distress. Continue with 30 min of aerobic exercise without signs/symptoms of physical distress. Continue with 30 min of aerobic exercise without signs/symptoms of physical distress.   Intensity -- THRR unchanged THRR unchanged THRR unchanged THRR unchanged     Progression    Progression -- Continue to progress workloads to maintain intensity without signs/symptoms of physical distress. Continue to progress workloads to maintain intensity without signs/symptoms of physical distress. Continue to progress workloads to maintain intensity without signs/symptoms of physical distress. Continue to progress workloads to maintain intensity without signs/symptoms of physical distress.   Average METs -- 2.03 2.3 2.42 3.85     Resistance Training   Training Prescription -- Yes Yes Yes Yes   Weight -- 4 lb 4 lb 4 lb 5 lb   Reps -- 10-15 10-15 10-15 10-15     Interval Training   Interval Training -- No No No No     Treadmill   MPH -- _1 --   Grade -- 0.5 0.5 0.5 --   Minutes -- _2 --   METs -- 2.67 2.67 2.67 --     Recumbant Bike   Level -- -- -- 4 --   Minutes -- -- -- 15 --   METs -- -- -- 2.3 --     NuStep   Level -- -- -- 4 4   SPM -- -- -- -- 80   Minutes -- -- -- 15 15   METs -- -- -- 2.3 4     Recumbant Elliptical   Level -- 1 -- -- --   Minutes -- 15 -- -- --   METs -- 1.4 -- -- --     Elliptical   Level -- 1 1 -- --   Speed -- 2 2 -- --   Minutes -- 15 15 -- --     REL-XR  Level -- -- -- 1 --   Minutes -- -- -- 15 --     T5 Nustep   Level -- -- -- 3 --   Minutes -- -- -- 15 --   METs -- -- -- 2 --     Biostep-RELP   Level -- -- -- -- 4   SPM -- -- -- -- 50   Minutes -- -- -- -- 15   METs -- -- -- -- 3     Home Exercise Plan   Plans to continue exercise at -- -- -- Home (comment)  walking Home (comment)  walking   Frequency -- -- -- Add 2 additional days to program exercise sessions. Add 2 additional days to program exercise sessions.   Initial Home Exercises Provided -- -- -- 08/03/19 08/03/19   Row Name 09/08/19 1300 09/22/19 1300 10/06/19 1300 10/20/19 1300       Response to Exercise   Blood Pressure (Admit) 126/56 110/72 110/60 146/74    Blood Pressure (Exercise) 126/70 152/70 180/88 162/76    Blood Pressure (Exit)  96/54 104/66 118/70 128/74    Heart Rate (Admit) 69 bpm 71 bpm 70 bpm 75 bpm    Heart Rate (Exercise) 93 bpm 88 bpm 90 bpm 90 bpm    Heart Rate (Exit) 67 bpm 64 bpm 69 bpm 73 bpm    Rating of Perceived Exertion (Exercise) _0 Symptoms fatigue on elliptical -- none none    Duration Continue with 30 min of aerobic exercise without signs/symptoms of physical distress. Continue with 30 min of aerobic exercise without signs/symptoms of physical distress. Continue with 30 min of aerobic exercise without signs/symptoms of physical distress. Continue with 30 min of aerobic exercise without signs/symptoms of physical distress.    Intensity THRR unchanged THRR unchanged THRR unchanged THRR unchanged      Progression   Progression Continue to progress workloads to maintain intensity without signs/symptoms of physical distress. Continue to progress workloads to maintain intensity without signs/symptoms of physical distress. Continue to progress workloads to maintain intensity without signs/symptoms of physical distress. Continue to progress workloads to maintain intensity without signs/symptoms of physical distress.    Average METs 3.13 2.75 3.29 3      Resistance Training   Training Prescription Yes Yes Yes Yes    Weight 5 lb 5 lb 5 lb 5 lb    Reps 10-15 10-15 10-15 10-15      Interval Training   Interval Training No No No No      Treadmill   MPH 2.4 2.6 2.6 2.3    Grade 0.5 0.5 0.5 0.5    Minutes _1 METs 3 3.17 3.17 2.92      NuStep   Level -- 5 -- --    SPM -- 80 -- --    Minutes -- 15 -- --    METs -- 2.3 -- --      Elliptical   Level 1 -- -- --    Speed 2 -- -- --    Minutes 15 -- -- --      REL-XR   Level 5 -- 5 6    Watts -- -- -- 50    Minutes 15 -- 15 15    METs 4.1 -- 3.3 3      T5 Nustep   Level 3 -- 5 --    Minutes 15 -- 15 --    METs  2.4 -- 3.4 --      Home Exercise Plan   Plans to continue exercise at Home (comment)  walking Home (comment)   walking Home (comment)  walking Home (comment)  walking    Frequency Add 2 additional days to program exercise sessions. Add 2 additional days to program exercise sessions. Add 2 additional days to program exercise sessions. Add 2 additional days to program exercise sessions.    Initial Home Exercises Provided 08/03/19 08/03/19 08/03/19 08/03/19           Exercise Comments:  Exercise Comments    Row Name 07/06/19 1402           Exercise Comments First full day of exercise!  Patient was oriented to gym and equipment including functions, settings, policies, and procedures.  Patient's individual exercise prescription and treatment plan were reviewed.  All starting workloads were established based on the results of the 6 minute walk test done at initial orientation visit.  The plan for exercise progression was also introduced and progression will be customized based on patient's performance and goals.              Exercise Goals and Review:  Exercise Goals    Row Name 07/02/19 1244             Exercise Goals   Increase Physical Activity Yes       Intervention Provide advice, education, support and counseling about physical activity/exercise needs.;Develop an individualized exercise prescription for aerobic and resistive training based on initial evaluation findings, risk stratification, comorbidities and participant's personal goals.       Expected Outcomes Long Term: Add in home exercise to make exercise part of routine and to increase amount of physical activity.;Long Term: Exercising regularly at least 3-5 days a week.;Short Term: Attend rehab on a regular basis to increase amount of physical activity.       Increase Strength and Stamina Yes       Intervention Provide advice, education, support and counseling about physical activity/exercise needs.;Develop an individualized exercise prescription for aerobic and resistive training based on initial evaluation findings, risk  stratification, comorbidities and participant's personal goals.       Expected Outcomes Short Term: Increase workloads from initial exercise prescription for resistance, speed, and METs.;Short Term: Perform resistance training exercises routinely during rehab and add in resistance training at home;Long Term: Improve cardiorespiratory fitness, muscular endurance and strength as measured by increased METs and functional capacity (6MWT)       Able to understand and use rate of perceived exertion (RPE) scale Yes       Intervention Provide education and explanation on how to use RPE scale       Expected Outcomes Short Term: Able to use RPE daily in rehab to express subjective intensity level;Long Term:  Able to use RPE to guide intensity level when exercising independently       Able to understand and use Dyspnea scale Yes       Intervention Provide education and explanation on how to use Dyspnea scale       Expected Outcomes Short Term: Able to use Dyspnea scale daily in rehab to express subjective sense of shortness of breath during exertion;Long Term: Able to use Dyspnea scale to guide intensity level when exercising independently       Knowledge and understanding of Target Heart Rate Range (THRR) Yes       Intervention Provide education and explanation of THRR including how the numbers were  predicted and where they are located for reference       Expected Outcomes Short Term: Able to state/look up THRR;Short Term: Able to use daily as guideline for intensity in rehab;Long Term: Able to use THRR to govern intensity when exercising independently       Able to check pulse independently Yes       Intervention Provide education and demonstration on how to check pulse in carotid and radial arteries.;Review the importance of being able to check your own pulse for safety during independent exercise       Expected Outcomes Short Term: Able to explain why pulse checking is important during independent  exercise;Long Term: Able to check pulse independently and accurately       Understanding of Exercise Prescription Yes       Intervention Provide education, explanation, and written materials on patient's individual exercise prescription       Expected Outcomes Short Term: Able to explain program exercise prescription;Long Term: Able to explain home exercise prescription to exercise independently              Exercise Goals Re-Evaluation :  Exercise Goals Re-Evaluation    Row Name 07/06/19 1403 07/14/19 1558 07/28/19 1339 08/05/19 1409 08/11/19 1543     Exercise Goal Re-Evaluation   Exercise Goals Review Knowledge and understanding of Target Heart Rate Range (THRR);Able to understand and use rate of perceived exertion (RPE) scale;Understanding of Exercise Prescription Increase Physical Activity;Increase Strength and Stamina;Understanding of Exercise Prescription Increase Physical Activity;Increase Strength and Stamina;Understanding of Exercise Prescription Increase Physical Activity;Increase Strength and Stamina;Understanding of Exercise Prescription;Able to understand and use rate of perceived exertion (RPE) scale;Knowledge and understanding of Target Heart Rate Range (THRR);Able to check pulse independently Increase Physical Activity;Increase Strength and Stamina;Understanding of Exercise Prescription   Comments Reviewed RPE and dyspnea scales, THR and program prescription with pt today.  Pt voiced understanding and was given a copy of goals to take home. Ed is off to a good start with rehab with his first two full day sessions.  He is already on the elliptical and doing well.  He has added 0.5% grade.  We will continue to monitor his progress. Ed is up to 6 min on the ellipitical.  Staff will have him progress slowly to 15 min. Reviewed home exercise with pt today.  Pt plans to walk at home and use [employer] for exercise.  Reviewed THR, pulse, RPE, sign and symptoms, NTG use, and when to call 911 or  MD.  Also discussed weather considerations and indoor options.  Pt voiced understanding. Ed has been doing well in rehab.  He is up to level 4 on the NuStep and has asked to switch off the elliptical as it is too hard for him.  We have rearranged his equipment.  We will continue to montior his progress.   Expected Outcomes Short: Use RPE daily to regulate intensity. Long: Follow program prescription in THR. Short: Continue to increase workloads  Long: Continue to follow program prescription. Short: work on extending time on elliptical Long:  improve overall stamina Short: add one day in adition to program sessions Long: exercise on his own Short: Try out new equipment rotation  Long: Continue to improve stamina   Row Name 08/19/19 1345 08/25/19 1456 09/08/19 1329 09/09/19 1338 09/22/19 1320     Exercise Goal Re-Evaluation   Exercise Goals Review Increase Physical Activity;Increase Strength and Stamina;Understanding of Exercise Prescription Increase Physical Activity;Increase Strength and Stamina;Understanding of Exercise Prescription Increase  Physical Activity;Increase Strength and Stamina;Understanding of Exercise Prescription Increase Physical Activity;Increase Strength and Stamina;Understanding of Exercise Prescription Increase Physical Activity;Increase Strength and Stamina;Understanding of Exercise Prescription   Comments Ed has been doing well in rehab.  He is walking with his dog at home a 1/4 mile each day.  We talked about extending this time out some.  He does note he has some more stamina overall. Ed is making steady progress.  He has increased to 2.4 mph on TM and 5 lb weights for strength. Ed continues to do well in rehab.  He has decided to drop back to twice a week due to his schedule.  He was able to get back on the ellipitcal for the first time yesterday and actually able to do 12 min!!!  We will continue to monitor his progress. Ed is doing well and continuing to walk on his off days. He is  walking an extra day outside of rehab, he is interested in  adding more days on to his schedule. He walks for about 1 hour and plans to walk in the mall on days that are too hot. Ed is making steady progress.  he has increase to 2.6 mph on TM.   Expected Outcomes Short: Walk longer at home Long; Continue to improve stamina. Short: continue to increase workloads Long :  improve MET level Short: Continue to improve stamina on the elliptical Long: Continue to improve stamina. Short: Add more walking to weekly routines Long: Continue to increase strength/endurace, progress MET level Short: attend consistently Long:  increase stamina   Row Name 10/06/19 1353 10/20/19 1353           Exercise Goal Re-Evaluation   Exercise Goals Review Increase Physical Activity;Increase Strength and Stamina;Understanding of Exercise Prescription Increase Physical Activity;Increase Strength and Stamina;Understanding of Exercise Prescription      Comments Ed has been doing well in rehab.  He is now up to level 5 on the T5 NuStep and XR.  We will continue to monitor his progress. Ed continues to do well with exercise.  He is now on level 6 for XR and NS.      Expected Outcomes Short: Continue to increase workloads Long; Continue to improve stamina. Short: Continue to increase workloads Long; Continue to improve stamina.             Discharge Exercise Prescription (Final Exercise Prescription Changes):  Exercise Prescription Changes - 10/20/19 1300      Response to Exercise   Blood Pressure (Admit) 146/74    Blood Pressure (Exercise) 162/76    Blood Pressure (Exit) 128/74    Heart Rate (Admit) 75 bpm    Heart Rate (Exercise) 90 bpm    Heart Rate (Exit) 73 bpm    Rating of Perceived Exertion (Exercise) 15    Symptoms none    Duration Continue with 30 min of aerobic exercise without signs/symptoms of physical distress.    Intensity THRR unchanged      Progression   Progression Continue to progress workloads to  maintain intensity without signs/symptoms of physical distress.    Average METs 3      Resistance Training   Training Prescription Yes    Weight 5 lb    Reps 10-15      Interval Training   Interval Training No      Treadmill   MPH 2.3    Grade 0.5    Minutes 15    METs 2.92      REL-XR  Level 6    Watts 50    Minutes 15    METs 3      Home Exercise Plan   Plans to continue exercise at Home (comment)   walking   Frequency Add 2 additional days to program exercise sessions.    Initial Home Exercises Provided 08/03/19           Nutrition:  Target Goals: Understanding of nutrition guidelines, daily intake of sodium <157m, cholesterol <2050m calories 30% from fat and 7% or less from saturated fats, daily to have 5 or more servings of fruits and vegetables.  Education: Controlling Sodium/Reading Food Labels -Group verbal and written material supporting the discussion of sodium use in heart healthy nutrition. Review and explanation with models, verbal and written materials for utilization of the food label.   Education: General Nutrition Guidelines/Fats and Fiber: -Group instruction provided by verbal, written material, models and posters to present the general guidelines for heart healthy nutrition. Gives an explanation and review of dietary fats and fiber.   Biometrics:  Pre Biometrics - 07/02/19 1254      Pre Biometrics   Height 5' 11.1" (1.806 m)    Weight 208 lb 14.4 oz (94.8 kg)    BMI (Calculated) 29.05    Single Leg Stand 14.6 seconds            Nutrition Therapy Plan and Nutrition Goals:  Nutrition Therapy & Goals - 07/15/19 1309      Nutrition Therapy   Diet Low Na, Heart Healthy, Diabetes Friendly    Protein (specify units) 75-80g    Fiber 30 grams    Whole Grain Foods 3 servings    Saturated Fats 12 max. grams    Fruits and Vegetables 5 servings/day    Sodium 1.5 grams      Personal Nutrition Goals   Nutrition Goal ST: substantial snack  before and after exercise (bean salad) and bring applesauce to rehab LT: Eat for heart and diabetes    Comments Last a1c was 6. Doesn't eat lunch normally, discussed consistant eating for diabetes. Discussed heart healthy eating and diabetes friendly eating. Pt reports eating mostly heart healthy. Breakfast is cheerios with banana, blueberries, walnuts, and milk.      Intervention Plan   Intervention Prescribe, educate and counsel regarding individualized specific dietary modifications aiming towards targeted core components such as weight, hypertension, lipid management, diabetes, heart failure and other comorbidities.;Nutrition handout(s) given to patient.    Expected Outcomes Short Term Goal: Understand basic principles of dietary content, such as calories, fat, sodium, cholesterol and nutrients.;Short Term Goal: A plan has been developed with personal nutrition goals set during dietitian appointment.;Long Term Goal: Adherence to prescribed nutrition plan.           Nutrition Assessments:  Nutrition Assessments - 07/02/19 1258      MEDFICTS Scores   Pre Score 47           MEDIFICTS Score Key:          ?70 Need to make dietary changes          40-70 Heart Healthy Diet         ? 40 Therapeutic Level Cholesterol Diet  Nutrition Goals Re-Evaluation:  Nutrition Goals Re-Evaluation    [place] 09/09/19 1355 09/28/19 1415           Goals   Nutrition Goal ST: substantial snack before and after exercise (bean salad) and bring applesauce to rehab LT: Eat for heart and  diabetes ST: substantial snack before and after exercise (bean salad) and bring applesauce to rehab, try to add greens to every dinner LT: Eat for heart and diabetes      Comment Continue to follow RD changes and recommendations Discussed how to add more greens to meals as pt reports struggling with this.      Expected Outcome Short: Follow regular changes Long: Maintain heart healthy diet ST: substantial snack before and  after exercise (bean salad) and bring applesauce to rehab, try to add greens to every dinner             Nutrition Goals Discharge (Final Nutrition Goals Re-Evaluation):  Nutrition Goals Re-Evaluation - 09/28/19 1415      Goals   Nutrition Goal ST: substantial snack before and after exercise (bean salad) and bring applesauce to rehab, try to add greens to every dinner LT: Eat for heart and diabetes    Comment Discussed how to add more greens to meals as pt reports struggling with this.    Expected Outcome ST: substantial snack before and after exercise (bean salad) and bring applesauce to rehab, try to add greens to every dinner           Psychosocial: Target Goals: Acknowledge presence or absence of significant depression and/or stress, maximize coping skills, provide positive support system. Participant is able to verbalize types and ability to use techniques and skills needed for reducing stress and depression.   Education: Depression - Provides group verbal and written instruction on the correlation between heart/lung disease and depressed mood, treatment options, and the stigmas associated with seeking treatment.   Education: Sleep Hygiene -Provides group verbal and written instruction about how sleep can affect your health.  Define sleep hygiene, discuss sleep cycles and impact of sleep habits. Review good sleep hygiene tips.     Education: Stress and Anxiety: - Provides group verbal and written instruction about the health risks of elevated stress and causes of high stress.  Discuss the correlation between heart/lung disease and anxiety and treatment options. Review healthy ways to manage with stress and anxiety.    Initial Review & Psychosocial Screening:  Initial Psych Review & Screening - 07/01/19 1115      Initial Review   Current issues with Current Sleep Concerns      Family Dynamics   Good Support System? No   Brothers in [place], neighbors are here,   Strains  --   No family lives nearby 2 brothers in [place].   Comments Sleep concerns are chronic.      Barriers   Psychosocial barriers to participate in program There are no identifiable barriers or psychosocial needs.;The patient should benefit from training in stress management and relaxation.      Screening Interventions   Interventions Encouraged to exercise    Expected Outcomes Short Term goal: Utilizing psychosocial counselor, staff and physician to assist with identification of specific Stressors or current issues interfering with healing process. Setting desired goal for each stressor or current issue identified.;Long Term Goal: Stressors or current issues are controlled or eliminated.;Short Term goal: Identification and review with participant of any Quality of Life or Depression concerns found by scoring the questionnaire.;Long Term goal: The participant improves quality of Life and PHQ9 Scores as seen by post scores and/or verbalization of changes           Quality of Life Scores:   Quality of Life - 07/02/19 1257      Quality of Life  Select Quality of Life      Quality of Life Scores   Health/Function Pre 26.57 %    Socioeconomic Pre 22.63 %    Psych/Spiritual Pre 29.14 %    Family Pre 17.67 %    GLOBAL Pre 25.72 %          Scores of 19 and below usually indicate a poorer quality of life in these areas.  A difference of  2-3 points is a clinically meaningful difference.  A difference of 2-3 points in the total score of the Quality of Life Index has been associated with significant improvement in overall quality of life, self-image, physical symptoms, and general health in studies assessing change in quality of life.  PHQ-9: Recent Review Flowsheet Data    Depression screen [facility] 2/9 09/09/2019 07/02/2019   Decreased Interest 1 2   Down, Depressed, Hopeless 0 1   PHQ - 2 Score 1 3   Altered sleeping 3 3   Tired, decreased energy 0 2   Change in appetite 0 1   Feeling bad or  failure about yourself  0 0   Trouble concentrating 0 0   Moving slowly or fidgety/restless 0 0   Suicidal thoughts 0 0   PHQ-9 Score 4 9   Difficult doing work/chores Not difficult at all Not difficult at all     Interpretation of Total Score  Total Score Depression Severity:  1-4 = Minimal depression, 5-9 = Mild depression, 10-14 = Moderate depression, 15-19 = Moderately severe depression, 20-27 = Severe depression   Psychosocial Evaluation and Intervention:  Psychosocial Evaluation - 07/01/19 1141      Psychosocial Evaluation & Interventions   Interventions Encouraged to exercise with the program and follow exercise prescription    Comments Fares ahs no barriers to attending the program.  He lives alone with his 74 year old dog.  HIs wife passed away 4 years ago. He does not have any family in [place]. He did state that he could call on his neighbors if he needed help. He is looking forward to attending the progran.    Expected Outcomes STG: Attends the progam on a consistent basis  LTG: Maintains his healthy lifestyle developed while in the program    Continue Psychosocial Services  Follow up required by staff           Psychosocial Re-Evaluation:  Psychosocial Re-Evaluation    [place] Name 08/19/19 1349 09/09/19 1353           Psychosocial Re-Evaluation   Current issues with Current Sleep Concerns;Current Stress Concerns Current Sleep Concerns;Current Stress Concerns      Comments Ed is doing better mentally.  He is sleeping better now.  He enjoys getting out to meet people and coming to class. Ed reports he doing well mentally. He is not sleeping to well due to his sleep apnea but thinks it has been better since the beginning. Denies any big stressors in his life. Stays compliant with antidepressants and reports making him feel well. PHQ imoroved by 5 points since last time!      Expected Outcomes Short: Continue to attend rehab for mental boost Long; Continue to stay positive Short:  Focus on maintaining regular sleep patterns, continue to exercise for mental health Long: Maintain positive attitude      Interventions Encouraged to attend Cardiac Rehabilitation for the exercise Encouraged to attend Cardiac Rehabilitation for the exercise      Continue Psychosocial Services  Follow up required by  staff Follow up required by staff             Psychosocial Discharge (Final Psychosocial Re-Evaluation):  Psychosocial Re-Evaluation - 09/09/19 1353      Psychosocial Re-Evaluation   Current issues with Current Sleep Concerns;Current Stress Concerns    Comments Ed reports he doing well mentally. He is not sleeping to well due to his sleep apnea but thinks it has been better since the beginning. Denies any big stressors in his life. Stays compliant with antidepressants and reports making him feel well. PHQ imoroved by 5 points since last time!    Expected Outcomes Short: Focus on maintaining regular sleep patterns, continue to exercise for mental health Long: Maintain positive attitude    Interventions Encouraged to attend Cardiac Rehabilitation for the exercise    Continue Psychosocial Services  Follow up required by staff           Vocational Rehabilitation: Provide vocational rehab assistance to qualifying candidates.   Vocational Rehab Evaluation & Intervention:  Vocational Rehab - 07/01/19 1123      Initial Vocational Rehab Evaluation & Intervention   Assessment shows need for Vocational Rehabilitation No           Education: Education Goals: Education classes will be provided on a variety of topics geared toward better understanding of heart health and risk factor modification. Participant will state understanding/return demonstration of topics presented as noted by education test scores.  Learning Barriers/Preferences:  Learning Barriers/Preferences - 07/01/19 1122      Learning Barriers/Preferences   Learning Barriers None    Learning Preferences None            General Cardiac Education Topics:  AED/CPR: - Group verbal and written instruction with the use of models to demonstrate the basic use of the AED with the basic ABC's of resuscitation.   Anatomy & Physiology of the Heart: - Group verbal and written instruction and models provide basic cardiac anatomy and physiology, with the coronary electrical and arterial systems. Review of Valvular disease and Heart Failure   Cardiac Procedures: - Group verbal and written instruction to review commonly prescribed medications for heart disease. Reviews the medication, class of the drug, and side effects. Includes the steps to properly store meds and maintain the prescription regimen. (beta blockers and nitrates)   Cardiac Medications I: - Group verbal and written instruction to review commonly prescribed medications for heart disease. Reviews the medication, class of the drug, and side effects. Includes the steps to properly store meds and maintain the prescription regimen.   Cardiac Rehab from 10/07/2019 in [facility] Cardiac and Pulmonary Rehab  Date 10/07/19  Educator SB  Instruction Review Code 1- Verbalizes Understanding      Cardiac Medications II: -Group verbal and written instruction to review commonly prescribed medications for heart disease. Reviews the medication, class of the drug, and side effects. (all other drug classes)    Go Sex-Intimacy & Heart Disease, Get SMART - Goal Setting: - Group verbal and written instruction through game format to discuss heart disease and the return to sexual intimacy. Provides group verbal and written material to discuss and apply goal setting through the application of the S.M.A.R.T. Method.   Other Matters of the Heart: - Provides group verbal, written materials and models to describe Stable Angina and Peripheral Artery. Includes description of the disease process and treatment options available to the cardiac patient.   Infection  Prevention: - Provides verbal and written material to individual with discussion of  infection control including proper hand washing and proper equipment cleaning during exercise session.   Falls Prevention: - Provides verbal and written material to individual with discussion of falls prevention and safety.   Other: -Provides group and verbal instruction on various topics (see comments)   Knowledge Questionnaire Score:  Knowledge Questionnaire Score - 07/02/19 1258      Knowledge Questionnaire Score   Pre Score 24/26 Education Focus: Nutrition, Exercise           Core Components/Risk Factors/Patient Goals at Admission:  Personal Goals and Risk Factors at Admission - 07/02/19 1259      Core Components/Risk Factors/Patient Goals on Admission    Weight Management Yes;Weight Loss    Intervention Weight Management: Develop a combined nutrition and exercise program designed to reach desired caloric intake, while maintaining appropriate intake of nutrient and fiber, sodium and fats, and appropriate energy expenditure required for the weight goal.;Weight Management: Provide education and appropriate resources to help participant work on and attain dietary goals.    Admit Weight 208 lb 14.4 oz (94.8 kg)    Goal Weight: Short Term 203 lb (92.1 kg)    Goal Weight: Long Term 198 lb (89.8 kg)    Expected Outcomes Short Term: Continue to assess and modify interventions until short term weight is achieved;Long Term: Adherence to nutrition and physical activity/exercise program aimed toward attainment of established weight goal;Weight Loss: Understanding of general recommendations for a balanced deficit meal plan, which promotes 1-2 lb weight loss per week and includes a negative energy balance of [phone] kcal/d;Understanding recommendations for meals to include 15-35% energy as protein, 25-35% energy from fat, 35-60% energy from carbohydrates, less than 229m of dietary cholesterol, 20-35 gm of total  fiber daily;Understanding of distribution of calorie intake throughout the day with the consumption of 4-5 meals/snacks    Diabetes Yes    Intervention Provide education about signs/symptoms and action to take for hypo/hyperglycemia.;Provide education about proper nutrition, including hydration, and aerobic/resistive exercise prescription along with prescribed medications to achieve blood glucose in normal ranges: Fasting glucose 65-99 mg/dL    Expected Outcomes Short Term: Participant verbalizes understanding of the signs/symptoms and immediate care of hyper/hypoglycemia, proper foot care and importance of medication, aerobic/resistive exercise and nutrition plan for blood glucose control.;Long Term: Attainment of HbA1C < 7%.    Hypertension Yes    Intervention Provide education on lifestyle modifcations including regular physical activity/exercise, weight management, moderate sodium restriction and increased consumption of fresh fruit, vegetables, and low fat dairy, alcohol moderation, and smoking cessation.;Monitor prescription use compliance.    Expected Outcomes Short Term: Continued assessment and intervention until BP is < 140/995mHG in hypertensive participants. < 130/8096mG in hypertensive participants with diabetes, heart failure or chronic kidney disease.;Long Term: Maintenance of blood pressure at goal levels.    Lipids Yes    Intervention Provide education and support for participant on nutrition & aerobic/resistive exercise along with prescribed medications to achieve LDL <80m81mDL >40mg1m Expected Outcomes Short Term: Participant states understanding of desired cholesterol values and is compliant with medications prescribed. Participant is following exercise prescription and nutrition guidelines.;Long Term: Cholesterol controlled with medications as prescribed, with individualized exercise RX and with personalized nutrition plan. Value goals: LDL < 80mg,58m > 40 mg.            Education:Diabetes - Individual verbal and written instruction to review signs/symptoms of diabetes, desired ranges of glucose level fasting, after meals and with exercise. Acknowledge that pre  and post exercise glucose checks will be done for 3 sessions at entry of program.   Education: Know Your Numbers and Risk Factors: -Group verbal and written instruction about important numbers in your health.  Discussion of what are risk factors and how they play a role in the disease process.  Review of Cholesterol, Blood Pressure, Diabetes, and BMI and the role they play in your overall health.   Core Components/Risk Factors/Patient Goals Review:   Goals and Risk Factor Review    Row Name 08/19/19 1350 09/09/19 1343           Core Components/Risk Factors/Patient Goals Review   Personal Goals Review Weight Management/Obesity;Hypertension;Diabetes;Lipids Weight Management/Obesity;Hypertension;Diabetes;Lipids      Review Ed is doing well in rehab.  His weight is trending down.  He keeps a good eye on his pressures and sugars.  His pressures have continued to do well.  When he first started, his sugars were dropping with exercise, now he has made changes and is staying stable. Ed would like to lose weight but is struggling to do so as he said his bowel movements are not regular, he plans to talk to his doctor about it at his upcoming appointment. Reports that he takes his blood sugars at home but does not do it as much as he would like to, reports normal ranges at home. He will plan to check more often and spoke about setting up alarms to remind him. Ed states his BPs have been stable here at rehab. He received a recent blood work and reports his A1C went down!      Expected Outcomes Short: Continue to work on weight loss Long; Continue to manage diabetes better. Short: Continue to check BG more often, focus on losing weight/speaking with doctor Long: Manage risk factors             Core  Components/Risk Factors/Patient Goals at Discharge (Final Review):   Goals and Risk Factor Review - 09/09/19 1343      Core Components/Risk Factors/Patient Goals Review   Personal Goals Review Weight Management/Obesity;Hypertension;Diabetes;Lipids    Review Ed would like to lose weight but is struggling to do so as he said his bowel movements are not regular, he plans to talk to his doctor about it at his upcoming appointment. Reports that he takes his blood sugars at home but does not do it as much as he would like to, reports normal ranges at home. He will plan to check more often and spoke about setting up alarms to remind him. Ed states his BPs have been stable here at rehab. He received a recent blood work and reports his A1C went down!    Expected Outcomes Short: Continue to check BG more often, focus on losing weight/speaking with doctor Long: Manage risk factors           ITP Comments:  ITP Comments    Row Name 07/01/19 1139 07/02/19 1239 07/06/19 1402 07/29/19 3267 08/26/19 0629   ITP Comments Virtual Orientation completed . Has EP Eval and Gym Orientation scheduled for tomorrow.  Documentation of diagnosis can be found in [facility] 05/22/2019 Completed 6MWT and gym orientation.  Initial ITP created and sent for review to Dr. Emily Filbert, Medical Director. First full day of exercise!  Patient was oriented to gym and equipment including functions, settings, policies, and procedures.  Patient's individual exercise prescription and treatment plan were reviewed.  All starting workloads were established based on the results of the 6 minute  walk test done at initial orientation visit.  The plan for exercise progression was also introduced and progression will be customized based on patient's performance and goals. 30 Day review completed. ITP review done, changes made as directed,and approval shown by signature of  Scientist, research (life sciences). 30 Day review completed. Medical Director ITP review done,  changes made as directed, and signed approval by Medical Director.   [place] Name 09/23/19 1053 10/21/19 0631         ITP Comments 30 Day review completed. Medical Director ITP review done, changes made as directed, and signed approval by Medical Director. 30 Day review completed. Medical Director ITP review done, changes made as directed, and signed approval by Medical Director.             Comments:

## 2019-10-26 ENCOUNTER — Other Ambulatory Visit: Payer: Self-pay

## 2019-10-26 ENCOUNTER — Encounter: Payer: Medicare HMO | Admitting: *Deleted

## 2019-10-26 VITALS — Ht 71.1 in | Wt 208.0 lb

## 2019-10-26 DIAGNOSIS — Z951 Presence of aortocoronary bypass graft: Secondary | ICD-10-CM | POA: Diagnosis not present

## 2019-10-26 NOTE — Progress Notes (Signed)
Daily Session Note  Patient Details  Name: Devin [name] [facility]. MRN: [id number] Date of Birth: 10-08-1945 Referring Provider:     Cardiac Rehab from 07/02/2019 in [facility] Cardiac and Pulmonary Rehab  Referring Provider Neoma Laming MD      Encounter Date: 10/26/2019  Check In:  Session Check In - 10/26/19 1432      Check-In   Supervising physician immediately available to respond to emergencies See telemetry face sheet for immediately available ER MD    Location [facility]-Cardiac & Pulmonary Rehab    Staff Present Heath Lark, RN, BSN, Laveda Norman, BS, ACSM CEP, Exercise Physiologist;Kara Eliezer Bottom, MS Exercise Physiologist    Virtual Visit No    Medication changes reported     No    Fall or balance concerns reported    No    Warm-up and Cool-down Performed on first and last piece of equipment    Resistance Training Performed Yes    VAD Patient? No    PAD/SET Patient? No      Pain Assessment   Currently in Pain? No/denies            6 Minute Walk    Row Name 07/02/19 1239 10/26/19 1419       6 Minute Walk   Phase Initial Discharge    Distance 1085 feet --    Distance % Change -- 1.38 %    Distance Feet Change -- 15 ft    Walk Time 6 minutes 6 minutes    # of Rest Breaks 0 0    MPH 2.05 2.08    METS 2.56 2.25    RPE 12 11    Perceived Dyspnea  1 --    VO2 Peak 7.21 7.87    Symptoms Yes (comment) No    Comments SOB --    Resting HR 72 bpm 80 bpm    Resting BP 104/66 130/64    Resting Oxygen Saturation  99 % --    Exercise Oxygen Saturation  during 6 min walk 96 % --    Max Ex. HR 84 bpm 92 bpm    Max Ex. BP 122/64 142/72    2 Minute Post BP 122/56 --             Social History   Tobacco Use  Smoking Status Former Smoker  Smokeless Tobacco Never Used  Tobacco Comment   Quit over 40 years ago    Goals Met:  Independence with exercise equipment Exercise tolerated well No report of cardiac concerns or symptoms  Goals Unmet:  Not  Applicable  Comments: Pt able to follow exercise prescription today without complaint.  Will continue to monitor for progression.    Dr. Emily Filbert is Medical Director for [place] and [facility].

## 2019-10-28 ENCOUNTER — Other Ambulatory Visit: Payer: Self-pay

## 2019-10-28 ENCOUNTER — Encounter: Payer: Medicare HMO | Admitting: *Deleted

## 2019-10-28 DIAGNOSIS — Z951 Presence of aortocoronary bypass graft: Secondary | ICD-10-CM | POA: Diagnosis not present

## 2019-10-28 NOTE — Patient Instructions (Signed)
Discharge Patient Instructions  Patient Details  Name: Devin [name] [facility]. MRN: [id number] Date of Birth: Oct 13, 1945 Referring Provider:  Dionisio David, MD   Number of Visits: 54 Reason for Discharge:  Patient reached a stable level of exercise. Patient independent in their exercise. Patient has met program and personal goals.  Smoking History:  Social History   Tobacco Use  Smoking Status Former Smoker  Smokeless Tobacco Never Used  Tobacco Comment   Quit over 40 years ago    Diagnosis:  No diagnosis found.  Initial Exercise Prescription:  Initial Exercise Prescription - 07/02/19 1200      Date of Initial Exercise RX and Referring Provider   Date 07/02/19    Referring Provider Neoma Laming MD      Treadmill   MPH 2    Grade 0    Minutes 15    METs 2.53      Recumbant Elliptical   Level 1    RPM 50    Minutes 15    METs 2      Elliptical   Level 1    Speed 2    Minutes 15      Prescription Details   Frequency (times per week) 3    Duration Progress to 30 minutes of continuous aerobic without signs/symptoms of physical distress      Intensity   THRR 40-80% of Max Heartrate 102-131    Ratings of Perceived Exertion 11-13    Perceived Dyspnea 0-4      Progression   Progression Continue to progress workloads to maintain intensity without signs/symptoms of physical distress.      Resistance Training   Training Prescription Yes    Weight 3 lb    Reps 10-15           Discharge Exercise Prescription (Final Exercise Prescription Changes):  Exercise Prescription Changes - 10/20/19 1300      Response to Exercise   Blood Pressure (Admit) 146/74    Blood Pressure (Exercise) 162/76    Blood Pressure (Exit) 128/74    Heart Rate (Admit) 75 bpm    Heart Rate (Exercise) 90 bpm    Heart Rate (Exit) 73 bpm    Rating of Perceived Exertion (Exercise) 15    Symptoms none    Duration Continue with 30 min of aerobic exercise without signs/symptoms of  physical distress.    Intensity THRR unchanged      Progression   Progression Continue to progress workloads to maintain intensity without signs/symptoms of physical distress.    Average METs 3      Resistance Training   Training Prescription Yes    Weight 5 lb    Reps 10-15      Interval Training   Interval Training No      Treadmill   MPH 2.3    Grade 0.5    Minutes 15    METs 2.92      REL-XR   Level 6    Watts 50    Minutes 15    METs 3      Home Exercise Plan   Plans to continue exercise at Home (comment)   walking   Frequency Add 2 additional days to program exercise sessions.    Initial Home Exercises Provided 08/03/19           Functional Capacity:  6 Minute Walk    Row Name 07/02/19 1239 10/26/19 1419       6 Minute Walk  Phase Initial Discharge    Distance 1085 feet --    Distance % Change -- 1.38 %    Distance Feet Change -- 15 ft    Walk Time 6 minutes 6 minutes    # of Rest Breaks 0 0    MPH 2.05 2.08    METS 2.56 2.25    RPE 12 11    Perceived Dyspnea  1 --    VO2 Peak 7.21 7.87    Symptoms Yes (comment) No    Comments SOB --    Resting HR 72 bpm 80 bpm    Resting BP 104/66 130/64    Resting Oxygen Saturation  99 % --    Exercise Oxygen Saturation  during 6 min walk 96 % --    Max Ex. HR 84 bpm 92 bpm    Max Ex. BP 122/64 142/72    2 Minute Post BP 122/56 --          Nutrition & Weight - Outcomes:  Pre Biometrics - 07/02/19 1254      Pre Biometrics   Height 5' 11.1" (1.806 m)    Weight 208 lb 14.4 oz (94.8 kg)    BMI (Calculated) 29.05    Single Leg Stand 14.6 seconds           Post Biometrics - 10/26/19 1420       Post  Biometrics   Height 5' 11.1" (1.806 m)    Weight 208 lb (94.3 kg)    BMI (Calculated) 28.93    Single Leg Stand 3.26 seconds          Nutrition:  Nutrition Therapy & Goals - 07/15/19 1309      Nutrition Therapy   Diet Low Na, Heart Healthy, Diabetes Friendly    Protein (specify units) 75-80g     Fiber 30 grams    Whole Grain Foods 3 servings    Saturated Fats 12 max. grams    Fruits and Vegetables 5 servings/day    Sodium 1.5 grams      Personal Nutrition Goals   Nutrition Goal ST: substantial snack before and after exercise (bean salad) and bring applesauce to rehab LT: Eat for heart and diabetes    Comments Last a1c was 6. Doesn't eat lunch normally, discussed consistant eating for diabetes. Discussed heart healthy eating and diabetes friendly eating. Pt reports eating mostly heart healthy. Breakfast is cheerios with banana, blueberries, walnuts, and milk.      Intervention Plan   Intervention Prescribe, educate and counsel regarding individualized specific dietary modifications aiming towards targeted core components such as weight, hypertension, lipid management, diabetes, heart failure and other comorbidities.;Nutrition handout(s) given to patient.    Expected Outcomes Short Term Goal: Understand basic principles of dietary content, such as calories, fat, sodium, cholesterol and nutrients.;Short Term Goal: A plan has been developed with personal nutrition goals set during dietitian appointment.;Long Term Goal: Adherence to prescribed nutrition plan.           Goals reviewed with patient; copy given to patient.

## 2019-10-28 NOTE — Progress Notes (Signed)
Daily Session Note  Patient Details  Name: Gean Larose [facility] MRN: [id number] Date of Birth: [birth year] Referring Provider:     Cardiac Rehab from 07/02/2019 in [facility] Cardiac and Pulmonary Rehab  Referring Provider Neoma Laming MD      Encounter Date: 10/28/2019  Check In:  Session Check In - 10/28/19 1519      Check-In   Staff Present Nyoka Cowden, RN, BSN, MA;Joseph Hood [place], [place] Exercise Physiologist    Virtual Visit No    Medication changes reported     No    Fall or balance concerns reported    No    Warm-up and Cool-down Performed on first and last piece of equipment    Resistance Training Performed Yes    VAD Patient? No    PAD/SET Patient? No      Pain Assessment   Currently in Pain? No/denies              Social History   Tobacco Use  Smoking Status Former Smoker  Smokeless Tobacco Never Used  Tobacco Comment   Quit over 40 years ago    Goals Met:  Independence with exercise equipment Exercise tolerated well No report of cardiac concerns or symptoms Strength training completed today  Goals Unmet:  Not Applicable  Comments: Pt able to follow exercise prescription today without complaint.  Will continue to monitor for progression.   Dr. Emily Filbert is Medical Director for [place] and [facility].

## 2019-11-10 ENCOUNTER — Telehealth: Payer: Self-pay

## 2019-11-10 NOTE — Telephone Encounter (Signed)
LMOM

## 2019-11-11 ENCOUNTER — Encounter: Payer: Self-pay | Admitting: *Deleted

## 2019-11-11 DIAGNOSIS — Z951 Presence of aortocoronary bypass graft: Secondary | ICD-10-CM

## 2019-11-11 NOTE — Progress Notes (Signed)
Discharge Progress Report  Patient Details  Name: Devin [name]. MRN: [id number] Date of Birth: Dec 11, 1945 Referring Provider:     Cardiac Rehab from 07/02/2019 in [facility] Cardiac [facility] Pulmonary Rehab  Referring Provider Neoma Laming MD       Number of Visits: 34  Reason for Discharge:  Patient reached a stable level of exercise. Patient independent in their exercise. Patient has met program and personal goals.  Smoking History:  Social History   Tobacco Use  Smoking Status Former Smoker  Smokeless Tobacco Never Used  Tobacco Comment   Quit over 40 years ago    Diagnosis:  S/P CABG x 1  ADL UCSD:   Initial Exercise Prescription:  Initial Exercise Prescription - 07/02/19 1200      Date of Initial Exercise RX and Referring Provider   Date 07/02/19    Referring Provider Neoma Laming MD      Treadmill   MPH 2    Grade 0    Minutes 15    METs 2.53      Recumbant Elliptical   Level 1    RPM 50    Minutes 15    METs 2      Elliptical   Level 1    Speed 2    Minutes 15      Prescription Details   Frequency (times per week) 3    Duration Progress to 30 minutes of continuous aerobic without signs/symptoms of physical distress      Intensity   THRR 40-80% of Max Heartrate 102-131    Ratings of Perceived Exertion 11-13    Perceived Dyspnea 0-4      Progression   Progression Continue to progress workloads to maintain intensity without signs/symptoms of physical distress.      Resistance Training   Training Prescription Yes    Weight 3 lb    Reps 10-15           Discharge Exercise Prescription (Final Exercise Prescription Changes):  Exercise Prescription Changes - 11/03/19 1300      Response to Exercise   Blood Pressure (Admit) 118/62    Blood Pressure (Exercise) 142/76    Blood Pressure (Exit) 100/60    Heart Rate (Admit) 70 bpm    Heart Rate (Exercise) 109 bpm    Heart Rate (Exit) 103 bpm    Rating of Perceived Exertion (Exercise) 13     Symptoms none    Duration Continue with 30 min of aerobic exercise without signs/symptoms of physical distress.    Intensity THRR unchanged      Progression   Progression Continue to progress workloads to maintain intensity without signs/symptoms of physical distress.    Average METs 3.53      Resistance Training   Training Prescription Yes    Weight 5 lb    Reps 10-15      Interval Training   Interval Training No      Treadmill   MPH 2.3    Grade 0    Minutes 15    METs 2.76      REL-XR   Level 6    Minutes 15    METs 4.3      Home Exercise Plan   Plans to continue exercise at Home (comment)   walking   Frequency Add 2 additional days to program exercise sessions.    Initial Home Exercises Provided 08/03/19           Functional Capacity:  6 Minute  Walk    Row Name 07/02/19 1239 10/26/19 1419       6 Minute Walk   Phase Initial Discharge    Distance 1085 feet --    Distance % Change -- 1.38 %    Distance Feet Change -- 15 ft    Walk Time 6 minutes 6 minutes    # of Rest Breaks 0 0    MPH 2.05 2.08    METS 2.56 2.25    RPE 12 11    Perceived Dyspnea  1 --    VO2 Peak 7.21 7.87    Symptoms Yes (comment) No    Comments SOB --    Resting HR 72 bpm 80 bpm    Resting BP 104/66 130/64    Resting Oxygen Saturation  99 % --    Exercise Oxygen Saturation  during 6 min walk 96 % --    Max Ex. HR 84 bpm 92 bpm    Max Ex. BP 122/64 142/72    2 Minute Post BP 122/56 --           Psychological, QOL, Others - Outcomes: PHQ 2/9: Depression screen [facility] 2/9 10/28/2019 09/09/2019 07/02/2019  Decreased Interest 2 1 2   Down, Depressed, Hopeless 0 0 1  PHQ - 2 Score 2 1 3   Altered sleeping 3 3 3   Tired, decreased energy 2 0 2  Change in appetite 1 0 1  Feeling bad or failure about yourself  0 0 0  Trouble concentrating 1 0 0  Moving slowly or fidgety/restless 1 0 0  Suicidal thoughts 0 0 0  PHQ-9 Score 10 4 9   Difficult doing work/chores Somewhat difficult Not  difficult at all Not difficult at all     Nutrition & Weight - Outcomes:  Pre Biometrics - 07/02/19 1254      Pre Biometrics   Height 5' 11.1" (1.806 m)    Weight 208 lb 14.4 oz (94.8 kg)    BMI (Calculated) 29.05    Single Leg Stand 14.6 seconds           Post Biometrics - 10/26/19 1420       Post  Biometrics   Height 5' 11.1" (1.806 m)    Weight 208 lb (94.3 kg)    BMI (Calculated) 28.93    Single Leg Stand 3.26 seconds           Nutrition:  Nutrition Therapy & Goals - 07/15/19 1309      Nutrition Therapy   Diet Low Na, Heart Healthy, Diabetes Friendly    Protein (specify units) 75-80g    Fiber 30 grams    Whole Grain Foods 3 servings    Saturated Fats 12 max. grams    Fruits and Vegetables 5 servings/day    Sodium 1.5 grams      Personal Nutrition Goals   Nutrition Goal ST: substantial snack before and after exercise (bean salad) and bring applesauce to rehab LT: Eat for heart and diabetes    Comments Last a1c was 6. Doesn't eat lunch normally, discussed consistant eating for diabetes. Discussed heart healthy eating and diabetes friendly eating. Pt reports eating mostly heart healthy. Breakfast is cheerios with banana, blueberries, walnuts, and milk.      Intervention Plan   Intervention Prescribe, educate and counsel regarding individualized specific dietary modifications aiming towards targeted core components such as weight, hypertension, lipid management, diabetes, heart failure and other comorbidities.;Nutrition handout(s) given to patient.    Expected Outcomes Short Term  Goal: Understand basic principles of dietary content, such as calories, fat, sodium, cholesterol and nutrients.;Short Term Goal: A plan has been developed with personal nutrition goals set during dietitian appointment.;Long Term Goal: Adherence to prescribed nutrition plan.           Nutrition Discharge:  Nutrition Assessments - 10/28/19 1424      MEDFICTS Scores   Pre Score 47     Post Score 9    Score Difference -38           Education Questionnaire Score:  Knowledge Questionnaire Score - 10/28/19 1424      Knowledge Questionnaire Score   Pre Score 24/26 Education Focus: Nutrition, Exercise    Post Score 23/26           Goals reviewed with patient; copy given to patient.

## 2019-11-11 NOTE — Progress Notes (Signed)
Cardiac Individual Treatment Plan  Patient Details  Name: Devin [name] [facility]. MRN: [id number] Date of Birth: 1945-05-05 Referring Provider:     Cardiac Rehab from 07/02/2019 in [facility] Cardiac and Pulmonary Rehab  Referring Provider Neoma Laming MD      Initial Encounter Date:    Cardiac Rehab from 07/02/2019 in [facility] Cardiac and Pulmonary Rehab  Date 07/02/19      Visit Diagnosis: S/P CABG x 1  Patient's Home Medications on Admission:  Current Outpatient Medications:  .  acetaminophen (TYLENOL) 500 MG tablet, Take 1,000 mg by mouth at bedtime., Disp: , Rfl:  .  apixaban (ELIQUIS) 5 MG TABS tablet, Take by mouth., Disp: , Rfl:  .  ascorbic acid (VITAMIN C) 500 MG tablet, Take 500 mg by mouth daily., Disp: , Rfl:  .  atorvastatin (LIPITOR) 80 MG tablet, Take 1 tablet (80 mg total) by mouth daily., Disp: 30 tablet, Rfl: 11 .  buPROPion (WELLBUTRIN XL) 300 MG 24 hr tablet, Take by mouth., Disp: , Rfl:  .  carvedilol (COREG) 25 MG tablet, Take 1 tablet (25 mg total) by mouth 2 (two) times daily., Disp: 60 tablet, Rfl: 11 .  Cholecalciferol 125 MCG (5000 UT) TABS, Take 5,000 Units by mouth daily., Disp: , Rfl:  .  Coenzyme Q10 100 MG capsule, Take by mouth., Disp: , Rfl:  .  EPINEPHrine (EPIPEN JR) 0.15 MG/0.3ML injection, Inject into the muscle., Disp: , Rfl:  .  esomeprazole (NEXIUM) 20 MG capsule, Take 20 mg by mouth daily., Disp: , Rfl:  .  ezetimibe (ZETIA) 10 MG tablet, Take 10 mg by mouth daily., Disp: , Rfl:  .  gabapentin (NEURONTIN) 300 MG capsule, Take 300 mg by mouth 2 (two) times daily., Disp: , Rfl:  .  ipratropium (ATROVENT HFA) 17 MCG/ACT inhaler, Inhale into the lungs., Disp: , Rfl:  .  ipratropium (ATROVENT) 0.02 % nebulizer solution, Inhale into the lungs., Disp: , Rfl:  .  isosorbide mononitrate (IMDUR) 30 MG 24 hr tablet, Take 1 tablet (30 mg total) by mouth daily., Disp: 30 tablet, Rfl: 1 .  metFORMIN (GLUCOPHAGE) 500 MG tablet, Take 500 mg by mouth 2 (two) times  daily., Disp: , Rfl:  .  Multiple Vitamin (MULTI-VITAMIN) tablet, Take 1 tablet by mouth daily., Disp: , Rfl:  .  PARoxetine (PAXIL) 30 MG tablet, Take 30 mg by mouth daily., Disp: , Rfl:  No current facility-administered medications for this visit.  Facility-Administered Medications Ordered in Other Visits:  .  sodium chloride flush (NS) 0.9 % injection 3 mL, 3 mL, Intravenous, Q12H, Dionisio David, MD  Past Medical History: Past Medical History:  Diagnosis Date  . CAD (coronary artery disease)   . Diabetes mellitus without complication ([place])   . Hypertension   . MI, old   . 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   Exercise Prescription Goal: Initial exercise prescription builds to 30-45 minutes a day of aerobic  activity, 2-3 days per week.  Home exercise guidelines will be given to patient during program as part of exercise prescription that the participant will acknowledge.   Education: Aerobic Exercise & Resistance Training: - Gives group verbal and written instruction on the various components of exercise. Focuses on aerobic and resistive training programs and the benefits of this training and how to safely progress through these programs..   Education: Exercise & Equipment Safety: - Individual verbal instruction and demonstration of equipment use and safety with use of the equipment.   Education: Exercise Physiology & General Exercise Guidelines: - Group verbal and written instruction with  models to review the exercise physiology of the cardiovascular system and associated critical values. Provides general exercise guidelines with specific guidelines to those with heart or lung disease.    Cardiac Rehab from 10/07/2019 in [facility] Cardiac and Pulmonary Rehab  Date 09/23/19  Educator AS  Instruction Review Code 1- Verbalizes Understanding      Education: Flexibility, Balance, Mind/Body Relaxation: Provides group verbal/written instruction on the benefits of flexibility and balance training, including mind/body exercise modes such as yoga, pilates and tai chi.  Demonstration and skill practice provided.   Activity Barriers & Risk Stratification:  Activity Barriers & Cardiac Risk Stratification - 07/02/19 1240      Activity Barriers & Cardiac Risk Stratification   Activity Barriers Deconditioning;Left Knee Replacement;Right Knee Replacement;Balance Concerns;Joint Problems   some shoulder issues   Cardiac Risk Stratification Moderate           6 Minute Walk:  6 Minute Walk    Row Name 07/02/19 1239 10/26/19 1419       6 Minute Walk   Phase Initial Discharge    Distance 1085 feet --    Distance % Change -- 1.38 %    Distance Feet Change -- 15 ft    Walk Time 6 minutes 6 minutes    # of Rest Breaks 0 0    MPH 2.05 2.08    METS 2.56 2.25    RPE 12 11    Perceived Dyspnea  1 --    VO2 Peak 7.21 7.87    Symptoms Yes (comment) No    Comments SOB --    Resting HR 72 bpm 80 bpm    Resting BP 104/66 130/64    Resting Oxygen Saturation  99 % --    Exercise Oxygen Saturation  during 6 min walk 96 % --    Max Ex. HR 84 bpm 92 bpm    Max Ex. BP 122/64 142/72    2 Minute Post BP 122/56 --           Oxygen Initial Assessment:   Oxygen Re-Evaluation:   Oxygen Discharge (Final Oxygen Re-Evaluation):   Initial Exercise Prescription:  Initial Exercise Prescription - 07/02/19 1200      Date of Initial Exercise RX and Referring Provider   Date 07/02/19     Referring Provider Adrian Blackwater MD      Treadmill   MPH 2    Grade 0    Minutes 15    METs 2.53      Recumbant Elliptical   Level 1    RPM 50    Minutes 15    METs 2      Elliptical   Level 1    Speed 2    Minutes 15      Prescription Details   Frequency (times per week) 3    Duration Progress to  30 minutes of continuous aerobic without signs/symptoms of physical distress      Intensity   THRR 40-80% of Max Heartrate 102-131    Ratings of Perceived Exertion 11-13    Perceived Dyspnea 0-4      Progression   Progression Continue to progress workloads to maintain intensity without signs/symptoms of physical distress.      Resistance Training   Training Prescription Yes    Weight 3 lb    Reps 10-15           Perform Capillary Blood Glucose checks as needed.  Exercise Prescription Changes:  Exercise Prescription Changes    Row Name 07/02/19 1200 07/14/19 1600 07/28/19 1300 08/11/19 1500 08/25/19 1400     Response to Exercise   Blood Pressure (Admit) 104/66 110/60 104/54 100/64 122/66   Blood Pressure (Exercise) 122/64 142/64 128/64 138/54 138/78   Blood Pressure (Exit) 122/56 126/78 112/62 94/64 122/64   Heart Rate (Admit) 72 bpm 68 bpm 70 bpm 68 bpm 84 bpm   Heart Rate (Exercise) 84 bpm 92 bpm 95 bpm 89 bpm 129 bpm   Heart Rate (Exit) 73 bpm 76 bpm 65 bpm 64 bpm 71 bpm   Oxygen Saturation (Admit) 99 % -- -- -- --   Oxygen Saturation (Exercise) 96 % -- -- -- --   Rating of Perceived Exertion (Exercise) 12 16 17 17 14    Perceived Dyspnea (Exercise) 1 -- -- -- --   Symptoms SOB none none none none   Comments walk test results second full day of exercise -- -- --   Duration -- Continue with 30 min of aerobic exercise without signs/symptoms of physical distress. Continue with 30 min of aerobic exercise without signs/symptoms of physical distress. Continue with 30 min of aerobic exercise without signs/symptoms of physical distress. Continue with 30 min of aerobic  exercise without signs/symptoms of physical distress.   Intensity -- THRR unchanged THRR unchanged THRR unchanged THRR unchanged     Progression   Progression -- Continue to progress workloads to maintain intensity without signs/symptoms of physical distress. Continue to progress workloads to maintain intensity without signs/symptoms of physical distress. Continue to progress workloads to maintain intensity without signs/symptoms of physical distress. Continue to progress workloads to maintain intensity without signs/symptoms of physical distress.   Average METs -- 2.03 2.3 2.42 3.85     Resistance Training   Training Prescription -- Yes Yes Yes Yes   Weight -- 4 lb 4 lb 4 lb 5 lb   Reps -- 10-15 10-15 10-15 10-15     Interval Training   Interval Training -- No No No No     Treadmill   MPH -- 2 2 2  --   Grade -- 0.5 0.5 0.5 --   Minutes -- 15 15 15  --   METs -- 2.67 2.67 2.67 --     Recumbant Bike   Level -- -- -- 4 --   Minutes -- -- -- 15 --   METs -- -- -- 2.3 --     NuStep   Level -- -- -- 4 4   SPM -- -- -- -- 80   Minutes -- -- -- 15 15   METs -- -- -- 2.3 4     Recumbant Elliptical   Level -- 1 -- -- --   Minutes -- 15 -- -- --   METs -- 1.4 -- -- --     Elliptical   Level -- 1 1 -- --  Speed -- 2 2 -- --   Minutes -- 15 15 -- --     REL-XR   Level -- -- -- 1 --   Minutes -- -- -- 15 --     T5 Nustep   Level -- -- -- 3 --   Minutes -- -- -- 15 --   METs -- -- -- 2 --     Biostep-RELP   Level -- -- -- -- 4   SPM -- -- -- -- 50   Minutes -- -- -- -- 15   METs -- -- -- -- 3     Home Exercise Plan   Plans to continue exercise at -- -- -- Home (comment)  walking Home (comment)  walking   Frequency -- -- -- Add 2 additional days to program exercise sessions. Add 2 additional days to program exercise sessions.   Initial Home Exercises Provided -- -- -- 08/03/19 08/03/19   Row Name 09/08/19 1300 09/22/19 1300 10/06/19 1300 10/20/19 1300 11/03/19 1300      Response to Exercise   Blood Pressure (Admit) 126/56 110/72 110/60 146/74 118/62   Blood Pressure (Exercise) 126/70 152/70 180/88 162/76 142/76   Blood Pressure (Exit) 96/54 104/66 118/70 128/74 100/60   Heart Rate (Admit) 69 bpm 71 bpm 70 bpm 75 bpm 70 bpm   Heart Rate (Exercise) 93 bpm 88 bpm 90 bpm 90 bpm 109 bpm   Heart Rate (Exit) 67 bpm 64 bpm 69 bpm 73 bpm 103 bpm   Rating of Perceived Exertion (Exercise) 17 15 17 15 13    Symptoms fatigue on elliptical -- none none none   Duration Continue with 30 min of aerobic exercise without signs/symptoms of physical distress. Continue with 30 min of aerobic exercise without signs/symptoms of physical distress. Continue with 30 min of aerobic exercise without signs/symptoms of physical distress. Continue with 30 min of aerobic exercise without signs/symptoms of physical distress. Continue with 30 min of aerobic exercise without signs/symptoms of physical distress.   Intensity THRR unchanged THRR unchanged THRR unchanged THRR unchanged THRR unchanged     Progression   Progression Continue to progress workloads to maintain intensity without signs/symptoms of physical distress. Continue to progress workloads to maintain intensity without signs/symptoms of physical distress. Continue to progress workloads to maintain intensity without signs/symptoms of physical distress. Continue to progress workloads to maintain intensity without signs/symptoms of physical distress. Continue to progress workloads to maintain intensity without signs/symptoms of physical distress.   Average METs 3.13 2.75 3.29 3 3.53     Resistance Training   Training Prescription Yes Yes Yes Yes Yes   Weight 5 lb 5 lb 5 lb 5 lb 5 lb   Reps 10-15 10-15 10-15 10-15 10-15     Interval Training   Interval Training No No No No No     Treadmill   MPH 2.4 2.6 2.6 2.3 2.3   Grade 0.5 0.5 0.5 0.5 0   Minutes 15 15 15 15 15    METs 3 3.17 3.17 2.92 2.76     NuStep   Level -- 5 -- -- --    SPM -- 80 -- -- --   Minutes -- 15 -- -- --   METs -- 2.3 -- -- --     Elliptical   Level 1 -- -- -- --   Speed 2 -- -- -- --   Minutes 15 -- -- -- --     REL-XR   Level 5 -- 5 6 6  Watts -- -- -- 50 --   Minutes 15 -- $Rem'15 15 15   'JJXV$ METs 4.1 -- 3.3 3 4.3     T5 Nustep   Level 3 -- 5 -- --   Minutes 15 -- 15 -- --   METs 2.4 -- 3.4 -- --     Home Exercise Plan   Plans to continue exercise at Home (comment)  walking Home (comment)  walking Home (comment)  walking Home (comment)  walking Home (comment)  walking   Frequency Add 2 additional days to program exercise sessions. Add 2 additional days to program exercise sessions. Add 2 additional days to program exercise sessions. Add 2 additional days to program exercise sessions. Add 2 additional days to program exercise sessions.   Initial Home Exercises Provided 08/03/19 08/03/19 08/03/19 08/03/19 08/03/19          Exercise Comments:  Exercise Comments    Row Name 07/06/19 1402           Exercise Comments First full day of exercise!  Patient was oriented to gym and equipment including functions, settings, policies, and procedures.  Patient's individual exercise prescription and treatment plan were reviewed.  All starting workloads were established based on the results of the 6 minute walk test done at initial orientation visit.  The plan for exercise progression was also introduced and progression will be customized based on patient's performance and goals.              Exercise Goals and Review:  Exercise Goals    Row Name 07/02/19 1244             Exercise Goals   Increase Physical Activity Yes       Intervention Provide advice, education, support and counseling about physical activity/exercise needs.;Develop an individualized exercise prescription for aerobic and resistive training based on initial evaluation findings, risk stratification, comorbidities and participant's personal goals.       Expected Outcomes Long  Term: Add in home exercise to make exercise part of routine and to increase amount of physical activity.;Long Term: Exercising regularly at least 3-5 days a week.;Short Term: Attend rehab on a regular basis to increase amount of physical activity.       Increase Strength and Stamina Yes       Intervention Provide advice, education, support and counseling about physical activity/exercise needs.;Develop an individualized exercise prescription for aerobic and resistive training based on initial evaluation findings, risk stratification, comorbidities and participant's personal goals.       Expected Outcomes Short Term: Increase workloads from initial exercise prescription for resistance, speed, and METs.;Short Term: Perform resistance training exercises routinely during rehab and add in resistance training at home;Long Term: Improve cardiorespiratory fitness, muscular endurance and strength as measured by increased METs and functional capacity (6MWT)       Able to understand and use rate of perceived exertion (RPE) scale Yes       Intervention Provide education and explanation on how to use RPE scale       Expected Outcomes Short Term: Able to use RPE daily in rehab to express subjective intensity level;Long Term:  Able to use RPE to guide intensity level when exercising independently       Able to understand and use Dyspnea scale Yes       Intervention Provide education and explanation on how to use Dyspnea scale       Expected Outcomes Short Term: Able to use Dyspnea scale daily in rehab to  express subjective sense of shortness of breath during exertion;Long Term: Able to use Dyspnea scale to guide intensity level when exercising independently       Knowledge and understanding of Target Heart Rate Range (THRR) Yes       Intervention Provide education and explanation of THRR including how the numbers were predicted and where they are located for reference       Expected Outcomes Short Term: Able to  state/look up THRR;Short Term: Able to use daily as guideline for intensity in rehab;Long Term: Able to use THRR to govern intensity when exercising independently       Able to check pulse independently Yes       Intervention Provide education and demonstration on how to check pulse in carotid and radial arteries.;Review the importance of being able to check your own pulse for safety during independent exercise       Expected Outcomes Short Term: Able to explain why pulse checking is important during independent exercise;Long Term: Able to check pulse independently and accurately       Understanding of Exercise Prescription Yes       Intervention Provide education, explanation, and written materials on patient's individual exercise prescription       Expected Outcomes Short Term: Able to explain program exercise prescription;Long Term: Able to explain home exercise prescription to exercise independently              Exercise Goals Re-Evaluation :  Exercise Goals Re-Evaluation    Row Name 07/06/19 1403 07/14/19 1558 07/28/19 1339 08/05/19 1409 08/11/19 1543     Exercise Goal Re-Evaluation   Exercise Goals Review Knowledge and understanding of Target Heart Rate Range (THRR);Able to understand and use rate of perceived exertion (RPE) scale;Understanding of Exercise Prescription Increase Physical Activity;Increase Strength and Stamina;Understanding of Exercise Prescription Increase Physical Activity;Increase Strength and Stamina;Understanding of Exercise Prescription Increase Physical Activity;Increase Strength and Stamina;Understanding of Exercise Prescription;Able to understand and use rate of perceived exertion (RPE) scale;Knowledge and understanding of Target Heart Rate Range (THRR);Able to check pulse independently Increase Physical Activity;Increase Strength and Stamina;Understanding of Exercise Prescription   Comments Reviewed RPE and dyspnea scales, THR and program prescription with pt today.   Pt voiced understanding and was given a copy of goals to take home. Ed is off to a good start with rehab with his first two full day sessions.  He is already on the elliptical and doing well.  He has added 0.5% grade.  We will continue to monitor his progress. Ed is up to 6 min on the ellipitical.  Staff will have him progress slowly to 15 min. Reviewed home exercise with pt today.  Pt plans to walk at home and use [employer] for exercise.  Reviewed THR, pulse, RPE, sign and symptoms, NTG use, and when to call 911 or MD.  Also discussed weather considerations and indoor options.  Pt voiced understanding. Ed has been doing well in rehab.  He is up to level 4 on the NuStep and has asked to switch off the elliptical as it is too hard for him.  We have rearranged his equipment.  We will continue to montior his progress.   Expected Outcomes Short: Use RPE daily to regulate intensity. Long: Follow program prescription in THR. Short: Continue to increase workloads  Long: Continue to follow program prescription. Short: work on extending time on elliptical Long:  improve overall stamina Short: add one day in adition to program sessions Long: exercise on his own Short:  Try out new equipment rotation  Long: Continue to improve stamina   Row Name 08/19/19 1345 08/25/19 1456 09/08/19 1329 09/09/19 1338 09/22/19 1320     Exercise Goal Re-Evaluation   Exercise Goals Review Increase Physical Activity;Increase Strength and Stamina;Understanding of Exercise Prescription Increase Physical Activity;Increase Strength and Stamina;Understanding of Exercise Prescription Increase Physical Activity;Increase Strength and Stamina;Understanding of Exercise Prescription Increase Physical Activity;Increase Strength and Stamina;Understanding of Exercise Prescription Increase Physical Activity;Increase Strength and Stamina;Understanding of Exercise Prescription   Comments Ed has been doing well in rehab.  He is walking with his dog at home a 1/4  mile each day.  We talked about extending this time out some.  He does note he has some more stamina overall. Ed is making steady progress.  He has increased to 2.4 mph on TM and 5 lb weights for strength. Ed continues to do well in rehab.  He has decided to drop back to twice a week due to his schedule.  He was able to get back on the ellipitcal for the first time yesterday and actually able to do 12 min!!!  We will continue to monitor his progress. Ed is doing well and continuing to walk on his off days. He is walking an extra day outside of rehab, he is interested in  adding more days on to his schedule. He walks for about 1 hour and plans to walk in the mall on days that are too hot. Ed is making steady progress.  he has increase to 2.6 mph on TM.   Expected Outcomes Short: Walk longer at home Long; Continue to improve stamina. Short: continue to increase workloads Long :  improve MET level Short: Continue to improve stamina on the elliptical Long: Continue to improve stamina. Short: Add more walking to weekly routines Long: Continue to increase strength/endurace, progress MET level Short: attend consistently Long:  increase stamina   Row Name 10/06/19 1353 10/20/19 1353 10/26/19 1435 11/03/19 1354       Exercise Goal Re-Evaluation   Exercise Goals Review Increase Physical Activity;Increase Strength and Stamina;Understanding of Exercise Prescription Increase Physical Activity;Increase Strength and Stamina;Understanding of Exercise Prescription Increase Physical Activity;Increase Strength and Stamina;Understanding of Exercise Prescription Increase Physical Activity;Increase Strength and Stamina;Understanding of Exercise Prescription    Comments Ed has been doing well in rehab.  He is now up to level 5 on the T5 NuStep and XR.  We will continue to monitor his progress. Ed continues to do well with exercise.  He is now on level 6 for XR and NS. Ed is set to graduate next week.  He improved his post  some. He is planning to continue to exericse at [employer] in [place] using his Silver Sneakers. Ed was out on vacation this week.  He will graduate next week and go to [employer].    Expected Outcomes Short: Continue to increase workloads Long; Continue to improve stamina. Short: Continue to increase workloads Long; Continue to improve stamina. Continue to exercise independently. Continue to exercise independently.           Discharge Exercise Prescription (Final Exercise Prescription Changes):  Exercise Prescription Changes - 11/03/19 1300      Response to Exercise   Blood Pressure (Admit) 118/62    Blood Pressure (Exercise) 142/76    Blood Pressure (Exit) 100/60    Heart Rate (Admit) 70 bpm    Heart Rate (Exercise) 109 bpm    Heart Rate (Exit) 103 bpm    Rating of  Perceived Exertion (Exercise) 13    Symptoms none    Duration Continue with 30 min of aerobic exercise without signs/symptoms of physical distress.    Intensity THRR unchanged      Progression   Progression Continue to progress workloads to maintain intensity without signs/symptoms of physical distress.    Average METs 3.53      Resistance Training   Training Prescription Yes    Weight 5 lb    Reps 10-15      Interval Training   Interval Training No      Treadmill   MPH 2.3    Grade 0    Minutes 15    METs 2.76      REL-XR   Level 6    Minutes 15    METs 4.3      Home Exercise Plan   Plans to continue exercise at Home (comment)   walking   Frequency Add 2 additional days to program exercise sessions.    Initial Home Exercises Provided 08/03/19           Nutrition:  Target Goals: Understanding of nutrition guidelines, daily intake of sodium '1500mg'$ , cholesterol '200mg'$ , calories 30% from fat and 7% or less from saturated fats, daily to have 5 or more servings of fruits and vegetables.  Education: Controlling Sodium/Reading Food Labels -Group verbal and written material supporting the  discussion of sodium use in heart healthy nutrition. Review and explanation with models, verbal and written materials for utilization of the food label.   Education: General Nutrition Guidelines/Fats and Fiber: -Group instruction provided by verbal, written material, models and posters to present the general guidelines for heart healthy nutrition. Gives an explanation and review of dietary fats and fiber.   Biometrics:  Pre Biometrics - 07/02/19 1254      Pre Biometrics   Height 5' 11.1" (1.806 m)    Weight 208 lb 14.4 oz (94.8 kg)    BMI (Calculated) 29.05    Single Leg Stand 14.6 seconds           Post Biometrics - 10/26/19 1420       Post  Biometrics   Height 5' 11.1" (1.806 m)    Weight 208 lb (94.3 kg)    BMI (Calculated) 28.93    Single Leg Stand 3.26 seconds           Nutrition Therapy Plan and Nutrition Goals:  Nutrition Therapy & Goals - 07/15/19 1309      Nutrition Therapy   Diet Low Na, Heart Healthy, Diabetes Friendly    Protein (specify units) 75-80g    Fiber 30 grams    Whole Grain Foods 3 servings    Saturated Fats 12 max. grams    Fruits and Vegetables 5 servings/day    Sodium 1.5 grams      Personal Nutrition Goals   Nutrition Goal ST: substantial snack before and after exercise (bean salad) and bring applesauce to rehab LT: Eat for heart and diabetes    Comments Last a1c was 6. Doesn't eat lunch normally, discussed consistant eating for diabetes. Discussed heart healthy eating and diabetes friendly eating. Pt reports eating mostly heart healthy. Breakfast is cheerios with banana, blueberries, walnuts, and milk.      Intervention Plan   Intervention Prescribe, educate and counsel regarding individualized specific dietary modifications aiming towards targeted core components such as weight, hypertension, lipid management, diabetes, heart failure and other comorbidities.;Nutrition handout(s) given to patient.    Expected Outcomes Short Term  Goal:  Understand basic principles of dietary content, such as calories, fat, sodium, cholesterol and nutrients.;Short Term Goal: A plan has been developed with personal nutrition goals set during dietitian appointment.;Long Term Goal: Adherence to prescribed nutrition plan.           Nutrition Assessments:  Nutrition Assessments - 10/28/19 1424      MEDFICTS Scores   Pre Score 47    Post Score 9    Score Difference -38           MEDIFICTS Score Key:          ?70 Need to make dietary changes          40-70 Heart Healthy Diet         ? 40 Therapeutic Level Cholesterol Diet  Nutrition Goals Re-Evaluation:  Nutrition Goals Re-Evaluation    [place] Name 09/09/19 1355 09/28/19 1415 10/26/19 1441         Goals   Nutrition Goal ST: substantial snack before and after exercise (bean salad) and bring applesauce to rehab LT: Eat for heart and diabetes ST: substantial snack before and after exercise (bean salad) and bring applesauce to rehab, try to add greens to every dinner LT: Eat for heart and diabetes Better snacks and add more greens     Comment Continue to follow RD changes and recommendations Discussed how to add more greens to meals as pt reports struggling with this. Ed is planning to continue to follow changes that he has already made.  He is working on snacking and adding in greens.     Expected Outcome Short: Follow regular changes Long: Maintain heart healthy diet ST: substantial snack before and after exercise (bean salad) and bring applesauce to rehab, try to add greens to every dinner Continue to follow heart healthy changes already made.            Nutrition Goals Discharge (Final Nutrition Goals Re-Evaluation):  Nutrition Goals Re-Evaluation - 10/26/19 1441      Goals   Nutrition Goal Better snacks and add more greens    Comment Ed is planning to continue to follow changes that he has already made.  He is working on snacking and adding in greens.    Expected Outcome Continue  to follow heart healthy changes already made.           Psychosocial: Target Goals: Acknowledge presence or absence of significant depression and/or stress, maximize coping skills, provide positive support system. Participant is able to verbalize types and ability to use techniques and skills needed for reducing stress and depression.   Education: Depression - Provides group verbal and written instruction on the correlation between heart/lung disease and depressed mood, treatment options, and the stigmas associated with seeking treatment.   Education: Sleep Hygiene -Provides group verbal and written instruction about how sleep can affect your health.  Define sleep hygiene, discuss sleep cycles and impact of sleep habits. Review good sleep hygiene tips.     Education: Stress and Anxiety: - Provides group verbal and written instruction about the health risks of elevated stress and causes of high stress.  Discuss the correlation between heart/lung disease and anxiety and treatment options. Review healthy ways to manage with stress and anxiety.    Initial Review & Psychosocial Screening:  Initial Psych Review & Screening - 07/01/19 1115      Initial Review   Current issues with Current Sleep Concerns      Family Dynamics   Good Support System? No  Brothers in [place], neighbors are here,   Strains --   No family lives nearby 2 brothers in [place].   Comments Sleep concerns are chronic.      Barriers   Psychosocial barriers to participate in program There are no identifiable barriers or psychosocial needs.;The patient should benefit from training in stress management and relaxation.      Screening Interventions   Interventions Encouraged to exercise    Expected Outcomes Short Term goal: Utilizing psychosocial counselor, staff and physician to assist with identification of specific Stressors or current issues interfering with healing process. Setting desired goal for each stressor  or current issue identified.;Long Term Goal: Stressors or current issues are controlled or eliminated.;Short Term goal: Identification and review with participant of any Quality of Life or Depression concerns found by scoring the questionnaire.;Long Term goal: The participant improves quality of Life and PHQ9 Scores as seen by post scores and/or verbalization of changes           Quality of Life Scores:   Quality of Life - 10/28/19 1424      Quality of Life Scores   Health/Function Pre 26.57 %    Health/Function Post 24.23 %    Health/Function % Change -8.81 %    Socioeconomic Pre 22.63 %    Socioeconomic Post 20 %    Socioeconomic % Change  -11.62 %    Psych/Spiritual Pre 29.14 %    Psych/Spiritual Post 24.2 %    Psych/Spiritual % Change -16.95 %    Family Pre 17.67 %    Family Post 13 %    Family % Change -26.43 %    GLOBAL Pre 25.72 %    GLOBAL Post 23.12 %    GLOBAL % Change -10.11 %          Scores of 19 and below usually indicate a poorer quality of life in these areas.  A difference of  2-3 points is a clinically meaningful difference.  A difference of 2-3 points in the total score of the Quality of Life Index has been associated with significant improvement in overall quality of life, self-image, physical symptoms, and general health in studies assessing change in quality of life.  PHQ-9: Recent Review Flowsheet Data    Depression screen [facility] 2/9 10/28/2019 09/09/2019 07/02/2019   Decreased Interest 2 1 2    Down, Depressed, Hopeless 0 0 1   PHQ - 2 Score 2 1 3    Altered sleeping 3 3 3    Tired, decreased energy 2 0 2   Change in appetite 1 0 1   Feeling bad or failure about yourself  0 0 0   Trouble concentrating 1 0 0   Moving slowly or fidgety/restless 1 0 0   Suicidal thoughts 0 0 0   PHQ-9 Score 10 4 9    Difficult doing work/chores Somewhat difficult Not difficult at all Not difficult at all     Interpretation of Total Score  Total Score Depression Severity:   1-4 = Minimal depression, 5-9 = Mild depression, 10-14 = Moderate depression, 15-19 = Moderately severe depression, 20-27 = Severe depression   Psychosocial Evaluation and Intervention:  Psychosocial Evaluation - 10/26/19 1443      Discharge Psychosocial Assessment & Intervention   Comments Ed will be graduating next week.  He has enjoyed the program and getting  back into the routine of exericse.  He enjoyed getting to know the staff as well.  He plans to continue to exercise by going  to [employer] in [place] and will contineu to keep eye on his risk factors.  He has learned some use tools and tips in the program.           Psychosocial Re-Evaluation:  Psychosocial Re-Evaluation    [place] Name 08/19/19 1349 09/09/19 1353           Psychosocial Re-Evaluation   Current issues with Current Sleep Concerns;Current Stress Concerns Current Sleep Concerns;Current Stress Concerns      Comments Ed is doing better mentally.  He is sleeping better now.  He enjoys getting out to meet people and coming to class. Ed reports he doing well mentally. He is not sleeping to well due to his sleep apnea but thinks it has been better since the beginning. Denies any big stressors in his life. Stays compliant with antidepressants and reports making him feel well. PHQ imoroved by 5 points since last time!      Expected Outcomes Short: Continue to attend rehab for mental boost Long; Continue to stay positive Short: Focus on maintaining regular sleep patterns, continue to exercise for mental health Long: Maintain positive attitude      Interventions Encouraged to attend Cardiac Rehabilitation for the exercise Encouraged to attend Cardiac Rehabilitation for the exercise      Continue Psychosocial Services  Follow up required by staff Follow up required by staff             Psychosocial Discharge (Final Psychosocial Re-Evaluation):  Psychosocial Re-Evaluation - 09/09/19 1353      Psychosocial Re-Evaluation    Current issues with Current Sleep Concerns;Current Stress Concerns    Comments Ed reports he doing well mentally. He is not sleeping to well due to his sleep apnea but thinks it has been better since the beginning. Denies any big stressors in his life. Stays compliant with antidepressants and reports making him feel well. PHQ imoroved by 5 points since last time!    Expected Outcomes Short: Focus on maintaining regular sleep patterns, continue to exercise for mental health Long: Maintain positive attitude    Interventions Encouraged to attend Cardiac Rehabilitation for the exercise    Continue Psychosocial Services  Follow up required by staff           Vocational Rehabilitation: Provide vocational rehab assistance to qualifying candidates.   Vocational Rehab Evaluation & Intervention:  Vocational Rehab - 07/01/19 1123      Initial Vocational Rehab Evaluation & Intervention   Assessment shows need for Vocational Rehabilitation No           Education: Education Goals: Education classes will be provided on a variety of topics geared toward better understanding of heart health and risk factor modification. Participant will state understanding/return demonstration of topics presented as noted by education test scores.  Learning Barriers/Preferences:  Learning Barriers/Preferences - 07/01/19 1122      Learning Barriers/Preferences   Learning Barriers None    Learning Preferences None           General Cardiac Education Topics:  AED/CPR: - Group verbal and written instruction with the use of models to demonstrate the basic use of the AED with the basic ABC's of resuscitation.   Anatomy & Physiology of the Heart: - Group verbal and written instruction and models provide basic cardiac anatomy and physiology, with the coronary electrical and arterial systems. Review of Valvular disease and Heart Failure   Cardiac Procedures: - Group verbal and written instruction to review  commonly prescribed medications for heart  disease. Reviews the medication, class of the drug, and side effects. Includes the steps to properly store meds and maintain the prescription regimen. (beta blockers and nitrates)   Cardiac Medications I: - Group verbal and written instruction to review commonly prescribed medications for heart disease. Reviews the medication, class of the drug, and side effects. Includes the steps to properly store meds and maintain the prescription regimen.   Cardiac Rehab from 10/07/2019 in [facility]  Date 10/07/19  Educator SB  Instruction Review Code 1- Verbalizes Understanding      Cardiac Medications II: -Group verbal and written instruction to review commonly prescribed medications for heart disease. Reviews the medication, class of the drug, and side effects. (all other drug classes)    Go Sex-Intimacy & Heart Disease, Get SMART - Goal Setting: - Group verbal and written instruction through game format to discuss heart disease and the return to sexual intimacy. Provides group verbal and written material to discuss and apply goal setting through the application of the S.M.A.R.T. Method.   Other Matters of the Heart: - Provides group verbal, written materials and models to describe Stable Angina and Peripheral Artery. Includes description of the disease process and treatment options available to the cardiac patient.   Infection Prevention: - Provides verbal and written material to individual with discussion of infection control including proper hand washing and proper equipment cleaning during exercise session.   Falls Prevention: - Provides verbal and written material to individual with discussion of falls prevention and safety.   Other: -Provides group and verbal instruction on various topics (see comments)   Knowledge Questionnaire Score:  Knowledge Questionnaire Score - 10/28/19 1424      Knowledge Questionnaire Score    Pre Score 24/26 Education Focus: Nutrition, Exercise    Post Score 23/26           Core Components/Risk Factors/Patient Goals at Admission:  Personal Goals and Risk Factors at Admission - 07/02/19 1259      Core Components/Risk Factors/Patient Goals on Admission    Weight Management Yes;Weight Loss    Intervention Weight Management: Develop a combined nutrition and exercise program designed to reach desired caloric intake, while maintaining appropriate intake of nutrient and fiber, sodium and fats, and appropriate energy expenditure required for the weight goal.;Weight Management: Provide education and appropriate resources to help participant work on and attain dietary goals.    Admit Weight 208 lb 14.4 oz (94.8 kg)    Goal Weight: Short Term 203 lb (92.1 kg)    Goal Weight: Long Term 198 lb (89.8 kg)    Expected Outcomes Short Term: Continue to assess and modify interventions until short term weight is achieved;Long Term: Adherence to nutrition and physical activity/exercise program aimed toward attainment of established weight goal;Weight Loss: Understanding of general recommendations for a balanced deficit meal plan, which promotes 1-2 lb weight loss per week and includes a negative energy balance of [phone] kcal/d;Understanding recommendations for meals to include 15-35% energy as protein, 25-35% energy from fat, 35-60% energy from carbohydrates, less than $RemoveB'200mg'abQnEKvF$  of dietary cholesterol, 20-35 gm of total fiber daily;Understanding of distribution of calorie intake throughout the day with the consumption of 4-5 meals/snacks    Diabetes Yes    Intervention Provide education about signs/symptoms and action to take for hypo/hyperglycemia.;Provide education about proper nutrition, including hydration, and aerobic/resistive exercise prescription along with prescribed medications to achieve blood glucose in normal ranges: Fasting glucose 65-99 mg/dL    Expected Outcomes Short Term: Participant  verbalizes understanding of the signs/symptoms and immediate care of hyper/hypoglycemia, proper foot care and importance of medication, aerobic/resistive exercise and nutrition plan for blood glucose control.;Long Term: Attainment of HbA1C < 7%.    Hypertension Yes    Intervention Provide education on lifestyle modifcations including regular physical activity/exercise, weight management, moderate sodium restriction and increased consumption of fresh fruit, vegetables, and low fat dairy, alcohol moderation, and smoking cessation.;Monitor prescription use compliance.    Expected Outcomes Short Term: Continued assessment and intervention until BP is < 140/50mm HG in hypertensive participants. < 130/41mm HG in hypertensive participants with diabetes, heart failure or chronic kidney disease.;Long Term: Maintenance of blood pressure at goal levels.    Lipids Yes    Intervention Provide education and support for participant on nutrition & aerobic/resistive exercise along with prescribed medications to achieve LDL '70mg'$ , HDL >$Remo'40mg'uijpK$ .    Expected Outcomes Short Term: Participant states understanding of desired cholesterol values and is compliant with medications prescribed. Participant is following exercise prescription and nutrition guidelines.;Long Term: Cholesterol controlled with medications as prescribed, with individualized exercise RX and with personalized nutrition plan. Value goals: LDL < $Rem'70mg'PXnh$ , HDL > 40 mg.           Education:Diabetes - Individual verbal and written instruction to review signs/symptoms of diabetes, desired ranges of glucose level fasting, after meals and with exercise. Acknowledge that pre and post exercise glucose checks will be done for 3 sessions at entry of program.   Education: Know Your Numbers and Risk Factors: -Group verbal and written instruction about important numbers in your health.  Discussion of what are risk factors and how they play a role in the disease process.   Review of Cholesterol, Blood Pressure, Diabetes, and BMI and the role they play in your overall health.   Core Components/Risk Factors/Patient Goals Review:   Goals and Risk Factor Review    [place] Name 08/19/19 1350 09/09/19 1343 10/26/19 1442         Core Components/Risk Factors/Patient Goals Review   Personal Goals Review Weight Management/Obesity;Hypertension;Diabetes;Lipids Weight Management/Obesity;Hypertension;Diabetes;Lipids Weight Management/Obesity;Hypertension;Diabetes;Lipids     Review Ed is doing well in rehab.  His weight is trending down.  He keeps a good eye on his pressures and sugars.  His pressures have continued to do well.  When he first started, his sugars were dropping with exercise, now he has made changes and is staying stable. Ed would like to lose weight but is struggling to do so as he said his bowel movements are not regular, he plans to talk to his doctor about it at his upcoming appointment. Reports that he takes his blood sugars at home but does not do it as much as he would like to, reports normal ranges at home. He will plan to check more often and spoke about setting up alarms to remind him. Ed states his BPs have been stable here at rehab. He received a recent blood work and reports his A1C went down! Ed will continue to work on his weight and watching his numbers.  He feels he has been control and a good plan going forward.  He plans to continue to work on weight loss with exercise and diet.     Expected Outcomes Short: Continue to work on weight loss Long; Continue to manage diabetes better. Short: Continue to check BG more often, focus on losing weight/speaking with doctor Long: Manage risk factors Continue to exercise and monitor risk factors.  Core Components/Risk Factors/Patient Goals at Discharge (Final Review):   Goals and Risk Factor Review - 10/26/19 1442      Core Components/Risk Factors/Patient Goals Review   Personal Goals Review Weight  Management/Obesity;Hypertension;Diabetes;Lipids    Review Ed will continue to work on his weight and watching his numbers.  He feels he has been control and a good plan going forward.  He plans to continue to work on weight loss with exercise and diet.    Expected Outcomes Continue to exercise and monitor risk factors.           ITP Comments:  ITP Comments    [place] Name 07/01/19 1139 07/02/19 1239 07/06/19 1402 07/29/19 8882 08/26/19 0629   ITP Comments Virtual Orientation completed . Has EP Eval and Gym Orientation scheduled for tomorrow.  Documentation of diagnosis can be found in [facility] 05/22/2019 Completed 6MWT and gym orientation.  Initial ITP created and sent for review to Dr. Emily Filbert, Medical Director. First full day of exercise!  Patient was oriented to gym and equipment including functions, settings, policies, and procedures.  Patient's individual exercise prescription and treatment plan were reviewed.  All starting workloads were established based on the results of the 6 minute walk test done at initial orientation visit.  The plan for exercise progression was also introduced and progression will be customized based on patient's performance and goals. 30 Day review completed. ITP review done, changes made as directed,and approval shown by signature of  Scientist, research (life sciences). 30 Day review completed. Medical Director ITP review done, changes made as directed, and signed approval by Medical Director.   [place] Name 09/23/19 1053 10/21/19 0631 11/11/19 0827       ITP Comments 30 Day review completed. Medical Director ITP review done, changes made as directed, and signed approval by Medical Director. 30 Day review completed. Medical Director ITP review done, changes made as directed, and signed approval by Medical Director. Discharge ITP sent and signed by Dr. Sabra Heck.  Discharge Summary routed to PCP and cardiologist.            Comments: Discharge ITP

## 2019-12-05 ENCOUNTER — Other Ambulatory Visit: Payer: Self-pay

## 2019-12-05 ENCOUNTER — Encounter: Payer: Self-pay | Admitting: Emergency Medicine

## 2019-12-05 DIAGNOSIS — Z96653 Presence of artificial knee joint, bilateral: Secondary | ICD-10-CM | POA: Diagnosis present

## 2019-12-05 DIAGNOSIS — E871 Hypo-osmolality and hyponatremia: Secondary | ICD-10-CM | POA: Diagnosis present

## 2019-12-05 DIAGNOSIS — E119 Type 2 diabetes mellitus without complications: Secondary | ICD-10-CM | POA: Diagnosis present

## 2019-12-05 DIAGNOSIS — E861 Hypovolemia: Principal | ICD-10-CM | POA: Diagnosis present

## 2019-12-05 DIAGNOSIS — Z7901 Long term (current) use of anticoagulants: Secondary | ICD-10-CM

## 2019-12-05 DIAGNOSIS — I480 Paroxysmal atrial fibrillation: Secondary | ICD-10-CM | POA: Diagnosis present

## 2019-12-05 DIAGNOSIS — I2510 Atherosclerotic heart disease of native coronary artery without angina pectoris: Secondary | ICD-10-CM | POA: Diagnosis present

## 2019-12-05 DIAGNOSIS — E878 Other disorders of electrolyte and fluid balance, not elsewhere classified: Secondary | ICD-10-CM | POA: Diagnosis present

## 2019-12-05 DIAGNOSIS — E785 Hyperlipidemia, unspecified: Secondary | ICD-10-CM | POA: Diagnosis present

## 2019-12-05 DIAGNOSIS — Z87891 Personal history of nicotine dependence: Secondary | ICD-10-CM

## 2019-12-05 DIAGNOSIS — I252 Old myocardial infarction: Secondary | ICD-10-CM

## 2019-12-05 DIAGNOSIS — Z955 Presence of coronary angioplasty implant and graft: Secondary | ICD-10-CM

## 2019-12-05 DIAGNOSIS — I10 Essential (primary) hypertension: Secondary | ICD-10-CM | POA: Diagnosis present

## 2019-12-05 DIAGNOSIS — E876 Hypokalemia: Secondary | ICD-10-CM | POA: Diagnosis present

## 2019-12-05 DIAGNOSIS — Z20822 Contact with and (suspected) exposure to covid-19: Secondary | ICD-10-CM | POA: Diagnosis present

## 2019-12-05 LAB — URINALYSIS, COMPLETE (UACMP) WITH MICROSCOPIC
Bacteria, UA: NONE SEEN
Bilirubin Urine: NEGATIVE
Glucose, UA: NEGATIVE mg/dL
Hgb urine dipstick: NEGATIVE
Ketones, ur: NEGATIVE mg/dL
Leukocytes,Ua: NEGATIVE
Nitrite: NEGATIVE
Protein, ur: NEGATIVE mg/dL
Specific Gravity, Urine: 1.012 (ref 1.005–1.030)
Squamous Epithelial / HPF: NONE SEEN (ref 0–5)
pH: 6 (ref 5.0–8.0)

## 2019-12-05 LAB — BASIC METABOLIC PANEL
Anion gap: 12 (ref 5–15)
BUN: 12 mg/dL (ref 8–23)
CO2: 25 mmol/L (ref 22–32)
Calcium: 9.5 mg/dL (ref 8.9–10.3)
Chloride: 85 mmol/L — ABNORMAL LOW (ref 98–111)
Creatinine, Ser: 0.77 mg/dL (ref 0.61–1.24)
GFR calc Af Amer: >60 mL/min (ref >60)
GFR calc non Af Amer: >60 mL/min (ref >60)
Glucose, Bld: 109 mg/dL — ABNORMAL HIGH (ref 70–99)
Potassium: 3.4 mmol/L — ABNORMAL LOW (ref 3.5–5.1)
Sodium: 122 mmol/L — ABNORMAL LOW (ref 135–145)

## 2019-12-05 LAB — CBC
HCT: 46.2 % (ref 39.0–52.0)
Hemoglobin: 16.8 g/dL (ref 13.0–17.0)
MCH: 31.3 pg (ref 26.0–34.0)
MCHC: 36.4 g/dL — ABNORMAL HIGH (ref 30.0–36.0)
MCV: 86.2 fL (ref 80.0–100.0)
Platelets: 267 10*3/uL (ref 150–400)
RBC: 5.36 MIL/uL (ref 4.22–5.81)
RDW: 15.2 % (ref 11.5–15.5)
WBC: 10 10*3/uL (ref 4.0–10.5)
nRBC: 0 % (ref 0.0–0.2)

## 2019-12-05 NOTE — ED Triage Notes (Signed)
Pt arrives POV and ambulatory to triage with c/o weakness which started x 1 week ago. Pt states that 3-4 days ago he started having difficulty getting out of the chair and walking and that it has continued. Pt also reports possible new onset atrial flutter. Pt is in NAD at this time and VAN negative.

## 2019-12-06 ENCOUNTER — Inpatient Hospital Stay
Admission: EM | Admit: 2019-12-06 | Discharge: 2019-12-07 | DRG: 641 | Disposition: A | Discharge status: Home / Self Care | Payer: Medicare HMO | Attending: Internal Medicine | Admitting: Internal Medicine

## 2019-12-06 DIAGNOSIS — E871 Hypo-osmolality and hyponatremia: Secondary | ICD-10-CM | POA: Diagnosis present

## 2019-12-06 DIAGNOSIS — Z20822 Contact with and (suspected) exposure to covid-19: Secondary | ICD-10-CM | POA: Diagnosis present

## 2019-12-06 DIAGNOSIS — E119 Type 2 diabetes mellitus without complications: Secondary | ICD-10-CM | POA: Diagnosis present

## 2019-12-06 DIAGNOSIS — E876 Hypokalemia: Secondary | ICD-10-CM | POA: Diagnosis present

## 2019-12-06 DIAGNOSIS — Z955 Presence of coronary angioplasty implant and graft: Secondary | ICD-10-CM | POA: Diagnosis not present

## 2019-12-06 DIAGNOSIS — I480 Paroxysmal atrial fibrillation: Secondary | ICD-10-CM

## 2019-12-06 DIAGNOSIS — I252 Old myocardial infarction: Secondary | ICD-10-CM | POA: Diagnosis not present

## 2019-12-06 DIAGNOSIS — E878 Other disorders of electrolyte and fluid balance, not elsewhere classified: Secondary | ICD-10-CM | POA: Diagnosis present

## 2019-12-06 DIAGNOSIS — R531 Weakness: Secondary | ICD-10-CM

## 2019-12-06 DIAGNOSIS — E785 Hyperlipidemia, unspecified: Secondary | ICD-10-CM | POA: Diagnosis present

## 2019-12-06 DIAGNOSIS — E861 Hypovolemia: Secondary | ICD-10-CM | POA: Diagnosis present

## 2019-12-06 DIAGNOSIS — I2510 Atherosclerotic heart disease of native coronary artery without angina pectoris: Secondary | ICD-10-CM | POA: Diagnosis present

## 2019-12-06 DIAGNOSIS — Z96653 Presence of artificial knee joint, bilateral: Secondary | ICD-10-CM | POA: Diagnosis present

## 2019-12-06 DIAGNOSIS — Z87891 Personal history of nicotine dependence: Secondary | ICD-10-CM | POA: Diagnosis not present

## 2019-12-06 DIAGNOSIS — I10 Essential (primary) hypertension: Secondary | ICD-10-CM | POA: Diagnosis present

## 2019-12-06 DIAGNOSIS — Z7901 Long term (current) use of anticoagulants: Secondary | ICD-10-CM | POA: Diagnosis not present

## 2019-12-06 LAB — BASIC METABOLIC PANEL
Anion gap: 10 (ref 5–15)
BUN: 10 mg/dL (ref 8–23)
CO2: 24 mmol/L (ref 22–32)
Calcium: 8.8 mg/dL — ABNORMAL LOW (ref 8.9–10.3)
Chloride: 92 mmol/L — ABNORMAL LOW (ref 98–111)
Creatinine, Ser: 0.66 mg/dL (ref 0.61–1.24)
GFR calc Af Amer: >60 mL/min (ref >60)
GFR calc non Af Amer: >60 mL/min (ref >60)
Glucose, Bld: 106 mg/dL — ABNORMAL HIGH (ref 70–99)
Potassium: 3.2 mmol/L — ABNORMAL LOW (ref 3.5–5.1)
Sodium: 126 mmol/L — ABNORMAL LOW (ref 135–145)

## 2019-12-06 LAB — CBC
HCT: 42 % (ref 39.0–52.0)
Hemoglobin: 14.8 g/dL (ref 13.0–17.0)
MCH: 31.2 pg (ref 26.0–34.0)
MCHC: 35.2 g/dL (ref 30.0–36.0)
MCV: 88.4 fL (ref 80.0–100.0)
Platelets: 223 10*3/uL (ref 150–400)
RBC: 4.75 MIL/uL (ref 4.22–5.81)
RDW: 15.1 % (ref 11.5–15.5)
WBC: 9.6 10*3/uL (ref 4.0–10.5)
nRBC: 0 % (ref 0.0–0.2)

## 2019-12-06 LAB — GLUCOSE, CAPILLARY
Glucose-Capillary: 223 mg/dL — ABNORMAL HIGH (ref 70–99)
Glucose-Capillary: 109 mg/dL — ABNORMAL HIGH (ref 70–99)
Glucose-Capillary: 115 mg/dL — ABNORMAL HIGH (ref 70–99)
Glucose-Capillary: 126 mg/dL — ABNORMAL HIGH (ref 70–99)

## 2019-12-06 LAB — OSMOLALITY, URINE
Osmolality, Ur: 181 mOsm/kg — ABNORMAL LOW (ref 300–900)
Osmolality, Ur: 139 mOsm/kg — ABNORMAL LOW (ref 300–900)

## 2019-12-06 LAB — NA AND K (SODIUM & POTASSIUM), RAND UR
Potassium Urine: 11 mmol/L
Sodium, Ur: 33 mmol/L

## 2019-12-06 LAB — OSMOLALITY: Osmolality: 256 mOsm/kg — ABNORMAL LOW (ref 275–295)

## 2019-12-06 LAB — TSH: TSH: 1.488 u[IU]/mL (ref 0.350–4.500)

## 2019-12-06 LAB — RESPIRATORY PANEL BY RT PCR (FLU A&B, COVID)
Influenza A by PCR: NEGATIVE
Influenza B by PCR: NEGATIVE
SARS Coronavirus 2 by RT PCR: NEGATIVE

## 2019-12-06 LAB — HEMOGLOBIN A1C
Hgb A1c MFr Bld: 6 % — ABNORMAL HIGH (ref 4.8–5.6)
Mean Plasma Glucose: 125.5 mg/dL

## 2019-12-06 LAB — MAGNESIUM: Magnesium: 1.5 mg/dL — ABNORMAL LOW (ref 1.7–2.4)

## 2019-12-06 LAB — SODIUM
Sodium: 126 mmol/L — ABNORMAL LOW (ref 135–145)
Sodium: 129 mmol/L — ABNORMAL LOW (ref 135–145)

## 2019-12-06 MED ORDER — TRAZODONE HCL 50 MG PO TABS
25.0000 mg | ORAL_TABLET | Freq: Every evening | ORAL | Status: DC | PRN
  Administered 2019-12-06: 25 mg via ORAL
  Filled 2019-12-06: qty 1

## 2019-12-06 MED ORDER — IPRATROPIUM-ALBUTEROL 0.5-2.5 (3) MG/3ML IN SOLN: 3.0000 mL | RESPIRATORY_TRACT | Status: DC | PRN

## 2019-12-06 MED ORDER — VITAMIN B-6 50 MG PO TABS
100.0000 mg | ORAL_TABLET | Freq: Every day | ORAL | Status: DC
  Administered 2019-12-06 – 2019-12-07 (×2): 100 mg via ORAL
  Filled 2019-12-06 (×2): qty 2

## 2019-12-06 MED ORDER — SERTRALINE HCL 50 MG PO TABS
25.0000 mg | ORAL_TABLET | Freq: Every day | ORAL | Status: DC
  Administered 2019-12-06 – 2019-12-07 (×2): 25 mg via ORAL
  Filled 2019-12-06 (×2): qty 1

## 2019-12-06 MED ORDER — GABAPENTIN 300 MG PO CAPS
300.0000 mg | ORAL_CAPSULE | Freq: Two times a day (BID) | ORAL | Status: DC
  Administered 2019-12-06 – 2019-12-07 (×3): 300 mg via ORAL
  Filled 2019-12-06 (×3): qty 1

## 2019-12-06 MED ORDER — ACETAMINOPHEN 650 MG RE SUPP: 650.0000 mg | Freq: Four times a day (QID) | RECTAL | Status: DC | PRN

## 2019-12-06 MED ORDER — BUPROPION HCL ER (XL) 150 MG PO TB24
300.0000 mg | ORAL_TABLET | Freq: Every day | ORAL | Status: DC
  Administered 2019-12-06 – 2019-12-07 (×2): 300 mg via ORAL
  Filled 2019-12-06 (×3): qty 2

## 2019-12-06 MED ORDER — ISOSORBIDE MONONITRATE ER 60 MG PO TB24
30.0000 mg | ORAL_TABLET | Freq: Every day | ORAL | Status: DC
  Administered 2019-12-06 – 2019-12-07 (×2): 30 mg via ORAL
  Filled 2019-12-06 (×2): qty 1

## 2019-12-06 MED ORDER — ASCORBIC ACID 500 MG PO TABS
500.0000 mg | ORAL_TABLET | Freq: Every day | ORAL | Status: DC
  Administered 2019-12-06 – 2019-12-07 (×2): 500 mg via ORAL
  Filled 2019-12-06 (×2): qty 1

## 2019-12-06 MED ORDER — ATORVASTATIN CALCIUM 20 MG PO TABS
80.0000 mg | ORAL_TABLET | Freq: Every day | ORAL | Status: DC
  Administered 2019-12-06 – 2019-12-07 (×2): 80 mg via ORAL
  Filled 2019-12-06 (×2): qty 4

## 2019-12-06 MED ORDER — CARVEDILOL 25 MG PO TABS
25.0000 mg | ORAL_TABLET | Freq: Two times a day (BID) | ORAL | Status: DC
  Administered 2019-12-06 – 2019-12-07 (×3): 25 mg via ORAL
  Filled 2019-12-06 (×3): qty 1

## 2019-12-06 MED ORDER — ADULT MULTIVITAMIN W/MINERALS CH
1.0000 | ORAL_TABLET | Freq: Every day | ORAL | Status: DC
  Administered 2019-12-06 – 2019-12-07 (×2): 1 via ORAL
  Filled 2019-12-06 (×2): qty 1

## 2019-12-06 MED ORDER — VITAMIN D 25 MCG (1000 UNIT) PO TABS
5000.0000 [IU] | ORAL_TABLET | Freq: Every day | ORAL | Status: DC
  Administered 2019-12-06 – 2019-12-07 (×2): 5000 [IU] via ORAL
  Filled 2019-12-06 (×2): qty 5

## 2019-12-06 MED ORDER — EZETIMIBE 10 MG PO TABS
10.0000 mg | ORAL_TABLET | Freq: Every day | ORAL | Status: DC
  Administered 2019-12-06 – 2019-12-07 (×2): 10 mg via ORAL
  Filled 2019-12-06 (×2): qty 1

## 2019-12-06 MED ORDER — AMIODARONE HCL 200 MG PO TABS
200.0000 mg | ORAL_TABLET | Freq: Every day | ORAL | Status: DC
  Administered 2019-12-06 – 2019-12-07 (×2): 200 mg via ORAL
  Filled 2019-12-06 (×2): qty 1

## 2019-12-06 MED ORDER — PAROXETINE HCL 30 MG PO TABS: 30.0000 mg | ORAL_TABLET | Freq: Every day | ORAL | Status: DC

## 2019-12-06 MED ORDER — INSULIN ASPART 100 UNIT/ML ~~LOC~~ SOLN
0.0000 [IU] | Freq: Three times a day (TID) | SUBCUTANEOUS | Status: DC
  Administered 2019-12-06: 3 [IU] via SUBCUTANEOUS
  Administered 2019-12-06: 1 [IU] via SUBCUTANEOUS
  Filled 2019-12-06 (×2): qty 1

## 2019-12-06 MED ORDER — SODIUM CHLORIDE 0.9 % IV BOLUS
1000.0000 mL | Freq: Once | INTRAVENOUS | Status: AC
  Administered 2019-12-06: 1000 mL via INTRAVENOUS

## 2019-12-06 MED ORDER — POTASSIUM CHLORIDE CRYS ER 20 MEQ PO TBCR
40.0000 meq | EXTENDED_RELEASE_TABLET | Freq: Once | ORAL | Status: AC
  Administered 2019-12-06: 40 meq via ORAL
  Filled 2019-12-06: qty 2

## 2019-12-06 MED ORDER — MAGNESIUM HYDROXIDE 400 MG/5ML PO SUSP: 30.0000 mL | Freq: Every day | ORAL | Status: DC | PRN

## 2019-12-06 MED ORDER — SACUBITRIL-VALSARTAN 49-51 MG PO TABS
1.0000 | ORAL_TABLET | Freq: Two times a day (BID) | ORAL | Status: DC
  Administered 2019-12-06 – 2019-12-07 (×3): 1 via ORAL
  Filled 2019-12-06 (×4): qty 1

## 2019-12-06 MED ORDER — POTASSIUM CHLORIDE 20 MEQ PO PACK: 40.0000 meq | PACK | Freq: Once | ORAL | Status: DC

## 2019-12-06 MED ORDER — ONDANSETRON HCL 4 MG PO TABS: 4.0000 mg | ORAL_TABLET | Freq: Four times a day (QID) | ORAL | Status: DC | PRN

## 2019-12-06 MED ORDER — APIXABAN 5 MG PO TABS
5.0000 mg | ORAL_TABLET | Freq: Two times a day (BID) | ORAL | Status: DC
  Administered 2019-12-06 – 2019-12-07 (×3): 5 mg via ORAL
  Filled 2019-12-06 (×3): qty 1

## 2019-12-06 MED ORDER — SODIUM CHLORIDE 0.9 % IV SOLN: INTRAVENOUS | Status: DC

## 2019-12-06 MED ORDER — PANTOPRAZOLE SODIUM 40 MG PO TBEC
40.0000 mg | DELAYED_RELEASE_TABLET | Freq: Every day | ORAL | Status: DC
  Administered 2019-12-06 – 2019-12-07 (×2): 40 mg via ORAL
  Filled 2019-12-06 (×2): qty 1

## 2019-12-06 MED ORDER — ONDANSETRON HCL 4 MG/2ML IJ SOLN: 4.0000 mg | Freq: Four times a day (QID) | INTRAMUSCULAR | Status: DC | PRN

## 2019-12-06 MED ORDER — ACETAMINOPHEN 325 MG PO TABS: 650.0000 mg | ORAL_TABLET | Freq: Four times a day (QID) | ORAL | Status: DC | PRN

## 2019-12-06 NOTE — ED Provider Notes (Signed)
[facility] Emergency Department Provider Note  ____________________________________________  Time seen: Approximately 1:20 AM  I have reviewed the triage vital signs and the nursing notes.   HISTORY  Chief Complaint Weakness   HPI Devin [name]. is a 74 y.o. male with history of CAD, diabetes, hypertension, paroxysmal atrial fibrillation on Eliquis who presents for evaluation of generalized weakness.  Patient reports that his symptoms have been getting progressively worse over the last month.  He saw his cardiologist a week ago and was diagnosed with A. fib and put on amiodarone and Eliquis.  Today he called his cardiologist because the weakness became so profound that he is now had a very hard time standing up and walking.  No localized unilateral weakness, no diplopia, no dysarthria, no dysphagia, no facial droop, no slurred speech, no vertigo, no chest pain or shortness of breath, no cough or fever, no vomiting or diarrhea, no dysuria or hematuria.  Patient does report that he has been eating and drinking normally.   Past Medical History:  Diagnosis Date  . CAD (coronary artery disease)   . Diabetes mellitus without complication (HCC)   . Hypertension   . MI, old   . Sleep apnea     Patient Active Problem List   Diagnosis Date Noted  . Chest pain 05/21/2019  . Unstable angina (HCC)   . Ischemic chest pain (HCC) 05/16/2019  . CAD (coronary artery disease) 05/16/2019  . Type 2 diabetes mellitus without complication (HCC) 05/16/2019  . Essential hypertension 05/16/2019  . Depression 05/16/2019  . GERD (gastroesophageal reflux disease) 05/16/2019    Past Surgical History:  Procedure Laterality Date  . APPENDECTOMY    . BREAST SURGERY     benign mass  . CORONARY ANGIOPLASTY WITH STENT PLACEMENT    . LEFT HEART CATH AND CORS/GRAFTS ANGIOGRAPHY N/A 05/21/2019   Procedure: LEFT HEART CATH AND CORONARY ANGIOGRAPHY;  Surgeon: Laurier Nancy,  MD;  Location: [facility] INVASIVE CV LAB;  Service: Cardiovascular;  Laterality: N/A;  . REPLACEMENT TOTAL KNEE BILATERAL      Prior to Admission medications   Medication Sig Start Date End Date Taking? Authorizing Provider  acetaminophen (TYLENOL) 500 MG tablet Take 1,000 mg by mouth at bedtime.    [provider]  apixaban (ELIQUIS) 5 MG TABS tablet Take by mouth. 06/05/19 08/22/19  [provider]  ascorbic acid (VITAMIN C) 500 MG tablet Take 500 mg by mouth daily.    [provider]  atorvastatin (LIPITOR) 80 MG tablet Take 1 tablet (80 mg total) by mouth daily. 05/16/19 05/15/20  Agbata, Elwyn Lade, MD  buPROPion (WELLBUTRIN XL) 300 MG 24 hr tablet Take by mouth.    [provider]  carvedilol (COREG) 25 MG tablet Take 1 tablet (25 mg total) by mouth 2 (two) times daily. 05/16/19 05/15/20  Lucile Shutters, MD  Cholecalciferol 125 MCG (5000 UT) TABS Take 5,000 Units by mouth daily.    [provider]  Coenzyme Q10 100 MG capsule Take by mouth.    [provider]  EPINEPHrine (EPIPEN JR) 0.15 MG/0.3ML injection Inject into the muscle.    [provider]  esomeprazole (NEXIUM) 20 MG capsule Take 20 mg by mouth daily.    [provider]  ezetimibe (ZETIA) 10 MG tablet Take 10 mg by mouth daily. 04/06/19   [provider]  gabapentin (NEURONTIN) 300 MG capsule Take 300 mg by mouth 2 (two) times daily. 05/01/19   [provider]  ipratropium (ATROVENT HFA) 17 MCG/ACT inhaler Inhale into the lungs.    [provider]  ipratropium (ATROVENT) 0.02 % nebulizer solution Inhale into the lungs. 06/05/19   [provider]  isosorbide mononitrate (IMDUR) 30 MG 24 hr tablet Take 1 tablet (30 mg total) by mouth daily. 05/16/19   Lucile Shutters, MD  metFORMIN (GLUCOPHAGE) 500 MG tablet Take 500 mg by mouth 2 (two) times daily. 02/17/19   [provider]  Multiple Vitamin (MULTI-VITAMIN) tablet Take 1 tablet by  mouth daily. 01/06/10   [provider]  PARoxetine (PAXIL) 30 MG tablet Take 30 mg by mouth daily. 03/19/19   [provider]  benazepril (LOTENSIN) 20 MG tablet Take 20 mg by mouth daily. 03/19/19 08/13/19  [provider]    Allergies Ace inhibitors, Morphine, and Yellow jacket venom [bee venom]  Family History  Problem Relation Age of Onset  . Osteoarthritis Mother   . Other Father 33       MVA    Social History Social History   Tobacco Use  . Smoking status: Former Games developer  . Smokeless tobacco: Never Used  . Tobacco comment: Quit over 40 years ago  Vaping Use  . Vaping Use: Never used  Substance Use Topics  . Alcohol use: Not Currently    Comment: social  . Drug use: Never    Review of Systems  Constitutional: Negative for fever. + Generalized weakness Eyes: Negative for visual changes. ENT: Negative for sore throat. Neck: No neck pain  Cardiovascular: Negative for chest pain. Respiratory: Negative for shortness of breath. Gastrointestinal: Negative for abdominal pain, vomiting or diarrhea. Genitourinary: Negative for dysuria. Musculoskeletal: Negative for back pain. Skin: Negative for rash. Neurological: Negative for headaches, weakness or numbness. Psych: No SI or HI  ____________________________________________   PHYSICAL EXAM:  VITAL SIGNS: ED Triage Vitals  Enc Vitals Group     BP 12/05/19 1955 (!) 156/90     Pulse Rate 12/05/19 1955 72     Resp 12/05/19 1955 19     Temp 12/05/19 1955 97.6 F (36.4 C)     Temp Source 12/05/19 1955 Oral     SpO2 12/05/19 1955 99 %     Weight 12/05/19 1908 208 lb (94.3 kg)     Height 12/05/19 1908 6' (1.829 m)     Head Circumference --      Peak Flow --      Pain Score 12/05/19 1908 0     Pain Loc --      Pain Edu? --      Excl. in GC? --     Constitutional: Alert and oriented. Well appearing and in no apparent distress. HEENT:      Head: Normocephalic and atraumatic.         Eyes:  Conjunctivae are normal. Sclera is non-icteric.       Mouth/Throat: Mucous membranes are moist.       Neck: Supple with no signs of meningismus. Cardiovascular: Regular rate and rhythm. No murmurs, gallops, or rubs. 2+ symmetrical distal pulses are present in all extremities.  Respiratory: Normal respiratory effort. Lungs are clear to auscultation bilaterally.  Gastrointestinal: Soft, non tender. Musculoskeletal:  No edema, cyanosis, or erythema of extremities. Neurologic: Normal speech and language. Face is symmetric. EOMI, PERRL, intact strength and sensation x4, no pronator drift, no dysmetria Skin: Skin is warm, dry and intact. No rash noted. Psychiatric: Mood and affect are normal. Speech and behavior are normal.  ____________________________________________  LABS (all labs ordered are listed, but only abnormal results are displayed)  Labs Reviewed  BASIC METABOLIC PANEL - Abnormal; Notable for the following components:      Result Value   Sodium 122 (*)    Potassium 3.4 (*)    Chloride 85 (*)    Glucose, Bld 109 (*)    All other components within normal limits  CBC - Abnormal; Notable for the following components:   MCHC 36.4 (*)    All other components within normal limits  URINALYSIS, COMPLETE (UACMP) WITH MICROSCOPIC - Abnormal; Notable for the following components:   Color, Urine YELLOW (*)    APPearance CLEAR (*)    All other components within normal limits  RESPIRATORY PANEL BY RT PCR (FLU A&B, COVID)  CBG MONITORING, ED   ____________________________________________  EKG  ED ECG REPORT I, Nita Sickle, the attending physician, personally viewed and interpreted this ECG.  Normal sinus rhythm with first-degree AV block, rate of 65, normal QTC, no ST elevations or depressions.  No significant changes when compared to prior. ____________________________________________  RADIOLOGY  none    ____________________________________________   PROCEDURES  Procedure(s) performed:yes .1-3 Lead EKG Interpretation Performed by: Nita Sickle, MD Authorized by: Nita Sickle, MD     Interpretation: non-specific     ECG rate assessment: normal     Rhythm: sinus rhythm     Ectopy: none     Critical Care performed:  None ____________________________________________   INITIAL IMPRESSION / ASSESSMENT AND PLAN / ED COURSE  74 y.o. male with history of CAD, diabetes, hypertension, paroxysmal atrial fibrillation on Eliquis who presents for evaluation of generalized weakness progressively for the last month.  Patient is well-appearing in no distress with normal vital signs, completely neurologically intact.  Physical exam is unremarkable.  EKG showing no signs of acute ischemia or dysrhythmias.  Labs showing hyponatremia with sodium of 122.  Normal kidney function, no leukocytosis, no anemia, no UTI.  No signs of stroke on exam.  Will give bolus of normal saline and admit to the hospitalist service for hyponatremia.  Old medical records reviewed.      _____________________________________________ Please note:  Patient was evaluated in Emergency Department today for the symptoms described in the history of present illness. Patient was evaluated in the context of the global COVID-19 pandemic, which necessitated consideration that the patient might be at risk for infection with the SARS-CoV-2 virus that causes COVID-19. Institutional protocols and algorithms that pertain to the evaluation of patients at risk for COVID-19 are in a state of rapid change based on information released by regulatory bodies including the CDC and federal and state organizations. These policies and algorithms were followed during the patient's care in the ED.  Some ED evaluations and interventions may be delayed as a result of limited staffing during the pandemic.   [place] Controlled Substance Database was  reviewed by me. ____________________________________________   FINAL CLINICAL IMPRESSION(S) / ED DIAGNOSES   Final diagnoses:  Generalized weakness  Hyponatremia      NEW MEDICATIONS STARTED DURING THIS VISIT:  ED Discharge Orders    None       Note:  This document was prepared using Dragon voice recognition software and may include unintentional dictation errors.    Don Perking, [place], MD 12/06/19 [phone]

## 2019-12-06 NOTE — Evaluation (Signed)
Physical Therapy Evaluation Patient Details Name: Devin [name] [facility]. MRN: [id number] DOB: [birth year] Today's Date: 12/06/2019   History of Present Illness  Pt is a 74 y.o. male presenting to hospital 9/25 with progressive worsening of weakness last month; difficulty standing up and walking; recent diagnosis of a-fib.  Pt admitted with symptomatic hyponatremia with associated generalized weakness, hypokalemia, and paroximal a-fib.  PMH includes CAD, DM, htn, paroximal a-fib on Eliquis, unstable angina, and h/o B TKR.  Clinical Impression  Prior to hospital admission, pt was independent with ambulation; h/o 3-4 falls in past 2-3 months; lives alone in 1 level home with 4-6 inch step to enter.  Currently pt is modified independent with bed mobility; CGA with transfers; and CGA with ambulation 160 feet with RW.  Pt preferring to use RW d/t providing increased support for pt (pt appearing steady and safe ambulating with RW).  Pt would benefit from skilled PT to address noted impairments and functional limitations (see below for any additional details).  Upon hospital discharge, pt would benefit from ongoing physical therapy (OP PT) but pt declining any physical therapy upon hospital discharge d/t planning on starting OP Pulmonary Rehab at [facility] soon.    Follow Up Recommendations  (pt reports plan to start OP Pulmonary Rehab at [facility] soon (pt declining any physical therapy upon discharge d/t this))    Equipment Recommendations  Rolling walker with 5" wheels    Recommendations for Other Services       Precautions / Restrictions Precautions Precautions: Fall Restrictions Weight Bearing Restrictions: No      Mobility  Bed Mobility Overal bed mobility: Modified Independent             General bed mobility comments: Semi-supine to/from sitting from stretcher bed (no difficulties noted)  Transfers Overall transfer level: Needs assistance Equipment used: Rolling walker (2  wheeled) Transfers: Sit to/from Stand Sit to Stand: Min guard         General transfer comment: mild increased effort to stand; vc's for UE placement for walker use  Ambulation/Gait Ambulation/Gait assistance: Min guard Gait Distance (Feet): 160 Feet Assistive device: Rolling walker (2 wheeled) Gait Pattern/deviations: Step-through pattern Gait velocity: decreased   General Gait Details: decreased L DF noted during L LE advancement; steady with RW use  Stairs            Wheelchair Mobility    Modified Rankin (Stroke Patients Only)       Balance Overall balance assessment: Needs assistance Sitting-balance support: No upper extremity supported;Feet supported Sitting balance-Leahy Scale: Normal Sitting balance - Comments: steady sitting reaching outside BOS   Standing balance support: Single extremity supported Standing balance-Leahy Scale: Fair Standing balance comment: pt requiring at least single UE support for static standing balance                             Pertinent Vitals/Pain Pain Assessment: No/denies pain  HR 66-75 bpm and O2 sats 94% or greater on room air during sessions activities.    Home Living Family/patient expects to be discharged to:: Private residence Living Arrangements: Alone   Type of Home: House Home Access: Stairs to enter Entrance Stairs-Rails: None Entrance Stairs-Number of Steps: 4-6 inch step to enter Home Layout: One level Home Equipment: Walker - 4 wheels      Prior Function Level of Independence: Independent         Comments: H/o 3-4 falls in past 2-3 months (d/t  tripping or walking too fast)     Hand Dominance        Extremity/Trunk Assessment   Upper Extremity Assessment Upper Extremity Assessment: Generalized weakness    Lower Extremity Assessment Lower Extremity Assessment: Generalized weakness;LLE deficits/detail LLE Deficits / Details: L DF 2+/5 (pt reports baseline)    Cervical /  Trunk Assessment Cervical / Trunk Assessment: Normal  Communication   Communication: No difficulties  Cognition Arousal/Alertness: Awake/alert Behavior During Therapy: WFL for tasks assessed/performed Overall Cognitive Status: Within Functional Limits for tasks assessed                                        General Comments   Nursing cleared pt for participation in physical therapy.  Pt agreeable to PT session.    Exercises  Gait training with RW   Assessment/Plan    PT Assessment Patient needs continued PT services  PT Problem List Decreased strength;Decreased balance;Decreased mobility;Decreased knowledge of use of DME       PT Treatment Interventions DME instruction;Gait training;Stair training;Functional mobility training;Therapeutic activities;Therapeutic exercise;Balance training;Patient/family education    PT Goals (Current goals can be found in the Care Plan section)  Acute Rehab PT Goals Patient Stated Goal: to improve balance and strength PT Goal Formulation: With patient Time For Goal Achievement: 12/20/19 Potential to Achieve Goals: Good    Frequency Min 2X/week   Barriers to discharge        Co-evaluation               AM-PAC PT "6 Clicks" Mobility  Outcome Measure Help needed turning from your back to your side while in a flat bed without using bedrails?: None Help needed moving from lying on your back to sitting on the side of a flat bed without using bedrails?: None Help needed moving to and from a bed to a chair (including a wheelchair)?: A Little Help needed standing up from a chair using your arms (e.g., wheelchair or bedside chair)?: A Little Help needed to walk in hospital room?: A Little Help needed climbing 3-5 steps with a railing? : A Little 6 Click Score: 20    End of Session Equipment Utilized During Treatment: Gait belt Activity Tolerance: Patient tolerated treatment well Patient left: with call bell/phone within  reach (in stretcher bed) Nurse Communication: Mobility status;Precautions PT Visit Diagnosis: Other abnormalities of gait and mobility (R26.89);Muscle weakness (generalized) (M62.81);History of falling (Z91.81);Repeated falls (R29.6)    Time: 5053-9767 PT Time Calculation (min) (ACUTE ONLY): 23 min   Charges:   PT Evaluation $PT Eval Low Complexity: 1 Low PT Treatments $Gait Training: 8-22 mins       Hendricks Limes, PT 12/06/19, 9:34 AM

## 2019-12-06 NOTE — H&P (Signed)
[place]   PATIENT NAME: Devin [name]    MR#:  [id number]  DATE OF BIRTH:  1945-06-20  DATE OF ADMISSION:  12/06/2019  PRIMARY CARE PHYSICIAN: [name], MD   REQUESTING/REFERRING PHYSICIAN: Nita Sickle, MD  CHIEF COMPLAINT:   Chief Complaint  Patient presents with  . Weakness    HISTORY OF PRESENT ILLNESS:  Devin [name]  is a 74 y.o. occasion male male with a known history of coronary artery disease, type II diabetes mellitus, paroxysmal atrial fibrillation on Eliquis and hypertension, who presented to the emergency room with acute onset of generalized weakness which has been worsening over the last month.  He saw his cardiologist Dr. Welton Flakes about a week ago and was diagnosed with atrial fibrillation.  He was placed on p.o. amiodarone and Eliquis.  When he called him today because of his significant weakness to the point it was hard for him to stand up and walk, he was advised to come to the ER.  He denied any unilateral or focal muscle weakness.  No headache or dizziness or blurred vision paresthesias or tinnitus or vertigo.  He has been having adequate appetite.  He denied any fever or chills no cough or wheezing or hemoptysis.  No chest pain or palpitations.  Upon presentation to the emergency room, blood pressure was 156/90 with otherwise normal vital signs. CMP revealed hyponatremia 122 with hypochloremia of 85 and hypokalemia of 3.4.  Urinalysis came back negative. EKG showed sinus rhythm with a rate of 65 with T wave inversion inferiorly.  The patient was given 1 L bolus of IV normal saline.  He will be admitted to a medical  bed for further evaluation and management. PAST MEDICAL HISTORY:   Past Medical History:  Diagnosis Date  . CAD (coronary artery disease)   . Diabetes mellitus without complication (HCC)   . Hypertension   . MI, old   . Sleep apnea     PAST SURGICAL HISTORY:   Past Surgical History:  Procedure Laterality Date  . APPENDECTOMY     . BREAST SURGERY     benign mass  . CORONARY ANGIOPLASTY WITH STENT PLACEMENT    . LEFT HEART CATH AND CORS/GRAFTS ANGIOGRAPHY N/A 05/21/2019   Procedure: LEFT HEART CATH AND CORONARY ANGIOGRAPHY;  Surgeon: Laurier Nancy, MD;  Location: [facility] INVASIVE CV LAB;  Service: Cardiovascular;  Laterality: N/A;  . REPLACEMENT TOTAL KNEE BILATERAL      SOCIAL HISTORY:   Social History   Tobacco Use  . Smoking status: Former Games developer  . Smokeless tobacco: Never Used  . Tobacco comment: Quit over 40 years ago  Substance Use Topics  . Alcohol use: Not Currently    Comment: social    FAMILY HISTORY:   Family History  Problem Relation Age of Onset  . Osteoarthritis Mother   . Other Father 37       MVA    DRUG ALLERGIES:   Allergies  Allergen Reactions  . Ace Inhibitors Swelling  . Morphine Swelling  . 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    REVIEW OF SYSTEMS:   ROS As per history of present illness. All pertinent systems were reviewed above. Constitutional, HEENT, cardiovascular, respiratory, GI, GU, musculoskeletal, neuro, psychiatric, endocrine, integumentary and hematologic systems were reviewed and are otherwise negative/unremarkable except for positive findings mentioned above in the HPI.   MEDICATIONS AT HOME:   Prior to Admission medications   Medication Sig Start Date End Date Taking? Authorizing Provider  acetaminophen (TYLENOL) 500 MG tablet Take 1,000 mg by mouth at bedtime.    [provider]  apixaban (ELIQUIS) 5 MG TABS tablet Take by mouth. 06/05/19 08/22/19  [provider]  ascorbic acid (VITAMIN C) 500 MG tablet Take 500 mg by mouth daily.    [provider]  atorvastatin (LIPITOR) 80 MG tablet Take 1 tablet (80 mg total) by mouth daily. 05/16/19 05/15/20  Agbata, Elwyn Lade, MD  buPROPion (WELLBUTRIN XL) 300 MG 24 hr tablet Take by mouth.    [provider]  carvedilol (COREG) 25 MG tablet Take 1 tablet (25 mg total) by  mouth 2 (two) times daily. 05/16/19 05/15/20  Lucile Shutters, MD  Cholecalciferol 125 MCG (5000 UT) TABS Take 5,000 Units by mouth daily.    [provider]  Coenzyme Q10 100 MG capsule Take by mouth.    [provider]  EPINEPHrine (EPIPEN JR) 0.15 MG/0.3ML injection Inject into the muscle.    [provider]  esomeprazole (NEXIUM) 20 MG capsule Take 20 mg by mouth daily.    [provider]  ezetimibe (ZETIA) 10 MG tablet Take 10 mg by mouth daily. 04/06/19   [provider]  gabapentin (NEURONTIN) 300 MG capsule Take 300 mg by mouth 2 (two) times daily. 05/01/19   [provider]  ipratropium (ATROVENT HFA) 17 MCG/ACT inhaler Inhale into the lungs.    [provider]  ipratropium (ATROVENT) 0.02 % nebulizer solution Inhale into the lungs. 06/05/19   [provider]  isosorbide mononitrate (IMDUR) 30 MG 24 hr tablet Take 1 tablet (30 mg total) by mouth daily. 05/16/19   Lucile Shutters, MD  metFORMIN (GLUCOPHAGE) 500 MG tablet Take 500 mg by mouth 2 (two) times daily. 02/17/19   [provider]  Multiple Vitamin (MULTI-VITAMIN) tablet Take 1 tablet by mouth daily. 01/06/10   [provider]  PARoxetine (PAXIL) 30 MG tablet Take 30 mg by mouth daily. 03/19/19   [provider]  benazepril (LOTENSIN) 20 MG tablet Take 20 mg by mouth daily. 03/19/19 08/13/19  [provider]      VITAL SIGNS:  Blood pressure (!) 164/95, pulse 68, temperature 97.6 F (36.4 C), temperature source Oral, resp. rate 18, height 6' (1.829 m), weight 94.3 kg, SpO2 99 %.  PHYSICAL EXAMINATION:  Physical Exam  GENERAL:  74 y.o.-year-old Caucasian male patient lying in the bed with no acute distress.  EYES: Pupils equal, round, reactive to light and accommodation. No scleral icterus. Extraocular muscles intact.  HEENT: Head atraumatic, normocephalic. Oropharynx and nasopharynx clear.  NECK:  Supple, no jugular venous  distention. No thyroid enlargement, no tenderness.  LUNGS: Normal breath sounds bilaterally, no wheezing, rales,rhonchi or crepitation. No use of accessory muscles of respiration.  CARDIOVASCULAR: Irregularly irregular rhythm, S1, S2 normal. No murmurs, rubs, or gallops.  ABDOMEN: Soft, nondistended, nontender. Bowel sounds present. No organomegaly or mass.  EXTREMITIES: No pedal edema, cyanosis, or clubbing.  NEUROLOGIC: Cranial nerves II through XII are intact. Muscle strength 5/5 in all extremities. Sensation intact. Gait not checked.  PSYCHIATRIC: The patient is alert and oriented x 3.  Normal affect and good eye contact. SKIN: No obvious rash, lesion, or ulcer.   LABORATORY PANEL:   CBC Recent Labs  Lab 12/05/19 1920  WBC 10.0  HGB 16.8  HCT 46.2  PLT 267   ------------------------------------------------------------------------------------------------------------------  Chemistries  Recent Labs  Lab 12/05/19 1920  NA 122*  K 3.4*  CL 85*  CO2 25  GLUCOSE  109*  BUN 12  CREATININE 0.77  CALCIUM 9.5   ------------------------------------------------------------------------------------------------------------------  Cardiac Enzymes No results for input(s): TROPONINI in the last 168 hours. ------------------------------------------------------------------------------------------------------------------  RADIOLOGY:  No results found.    IMPRESSION AND PLAN:   1.  Symptomatic hyponatremia with associated generalized weakness. -The patient will be admitted to a medical bed. -He will be hydrated with IV normal saline. -We will obtain hyponatremia work-up including urinary osmolality as well as serum osmolality, TSH urine electrolytes. -Physical therapy consult to be obtained.  2.  Hypokalemia. -We will replace potassium and check magnesium level.  3.  Paroxysmal atrial fibrillation with controlled ventricular response. -We will continue amiodarone and  Eliquis. -We will check TSH level given amiodarone therapy and generalized weakness.  4.  Dyslipidemia. -We will continue statin therapy and Zetia.  5.  Coronary artery disease. -We will continue  beta-blocker therapy, Imdur and statin therapy.  6.  Type 2 diabetes mellitus. -We will place him on supplemental coverage with subcutaneous NovoLog.  7.  Hypertension -We will continue Coreg and Entresto.  8.  DVT prophylaxis. -We will continue Eliquis.  All the records are reviewed and case discussed with ED provider. The plan of care was discussed in details with the patient (and family). I answered all questions. The patient agreed to proceed with the above mentioned plan. Further management will depend upon hospital course.   CODE STATUS: Full code  Status is: Inpatient  Remains inpatient appropriate because:Persistent severe electrolyte disturbances, Ongoing diagnostic testing needed not appropriate for outpatient work up, Unsafe d/c plan, IV treatments appropriate due to intensity of illness or inability to take PO and Inpatient level of care appropriate due to severity of illness   Dispo: The patient is from: Home              Anticipated d/c is to: Home              Anticipated d/c date is: 2 days              Patient currently is not medically stable to d/c.   TOTAL TIME TAKING CARE OF THIS PATIENT:55 minutes.    Hannah Beat M.D on 12/06/2019 at 1:49 AM  [facility]   From 7 PM-7 AM, contact night-coverage [email or web address]  CC: Primary care physician; [name], MD

## 2019-12-06 NOTE — ED Notes (Signed)
Pt at bedside working with patient 

## 2019-12-06 NOTE — ED Notes (Signed)
Pt given lunch tray.

## 2019-12-06 NOTE — Progress Notes (Signed)
Brief hospitalist update note.  This is a nonbillable note.  Please see scanned H&P for full billable details.  Briefly, this is a 74 year old male with known history of coronary artery disease, type 2 diabetes mellitus, paroxysmal atrial fibrillation on Eliquis, hypertension who presented to the ED with acute onset of generalized weakness worsening over the past month.  He saw his cardiologist Dr. Lennette Bihari 1 week ago diagnosed with atrial fibrillation.  Placed on amiodarone and Eliquis.  He then experienced significant weakness to the point that it was hard to stand up and walk.  Serum sodium on presentation 122.  Felt to be due to hypovolemic hyponatremia versus SIADH.  Started on isotonic maintenance fluids with improvement in serum sodium to 126.  Careful not to overcorrect.  SIADH is another possibility.  Hyponatremia and SIADH is a rare but serious complication of amiodarone described in the literature.  Will recheck urine osmolality and urine sodium after fluid resuscitation.  Check a.m. cortisol.  Continue IV fluids.  Physical therapy consult requested.  Remainder of care per HPI.  Lolita Patella MD

## 2019-12-06 NOTE — ED Notes (Signed)
Pt given graham crackers and peanut butter per request. 

## 2019-12-06 NOTE — ED Notes (Signed)
Pt requesting to walk around to move legs. This RN walked with patient while he ambulated with walker in ER. Pt walked safely and independently with walker. Back in bed at this time, monitor reapplied. Aundra Millet, primary RN aware.

## 2019-12-06 NOTE — ED Notes (Signed)
Phone provided per pt request

## 2019-12-06 NOTE — ED Notes (Signed)
Waiting for meds to be verified by pharmacy prior to administering

## 2019-12-06 NOTE — ED Notes (Signed)
Pt to the room in a wheelchair.  Pt states he is feeling very weak, and he states this began about a month ago, has been having a hard time sitting up in bed and getting out of bed.  Pt called Dr. Park Breed on Saturday and he states Dr. Welton Flakes told him to come to the ER because he thought he may have had a stroke. Pt states symptoms included loss of balance, inability to walk normally, inability to get up from a chair.  Dr. Don Perking bedside at this time.

## 2019-12-07 LAB — BASIC METABOLIC PANEL
Anion gap: 7 (ref 5–15)
BUN: 11 mg/dL (ref 8–23)
CO2: 27 mmol/L (ref 22–32)
Calcium: 9 mg/dL (ref 8.9–10.3)
Chloride: 102 mmol/L (ref 98–111)
Creatinine, Ser: 0.91 mg/dL (ref 0.61–1.24)
GFR calc Af Amer: >60 mL/min (ref >60)
GFR calc non Af Amer: >60 mL/min (ref >60)
Glucose, Bld: 106 mg/dL — ABNORMAL HIGH (ref 70–99)
Potassium: 4.2 mmol/L (ref 3.5–5.1)
Sodium: 136 mmol/L (ref 135–145)

## 2019-12-07 LAB — CORTISOL-AM, BLOOD: Cortisol - AM: 10.9 ug/dL (ref 6.7–22.6)

## 2019-12-07 LAB — MAGNESIUM: Magnesium: 2.1 mg/dL (ref 1.7–2.4)

## 2019-12-07 LAB — GLUCOSE, CAPILLARY: Glucose-Capillary: 90 mg/dL (ref 70–99)

## 2019-12-07 NOTE — ED Notes (Signed)
24 HOUR URINE COLLECTION BEGUN ON ICE

## 2019-12-07 NOTE — Discharge Summary (Signed)
Physician Discharge Summary  Devin [name]. VHQ:[id number] DOB: 07-14-1945 DOA: 12/06/2019  PCP: [name], MD  Admit date: 12/06/2019 Discharge date: 12/07/2019  Admitted From: Home Disposition:  Home  Recommendations for Outpatient Follow-up:  1. Follow up with PCP in 1-2 weeks 2. Re-check serum sodium with PCP  Home Health:No Equipment/Devices:None Discharge Condition:Stable CODE STATUS:Stable Diet recommendation: Heart Healthy  Brief/Interim Summary: 74 year old male with known history of coronary artery disease, type 2 diabetes mellitus, paroxysmal atrial fibrillation on Eliquis, hypertension who presented to the ED with acute onset of generalized weakness worsening over the past month.  He saw his cardiologist Dr. Lennette Bihari 1 week ago diagnosed with atrial fibrillation.  Placed on amiodarone and Eliquis.  He then experienced significant weakness to the point that it was hard to stand up and walk.  Serum sodium on presentation 122.  Felt to be due to hypovolemic hyponatremia versus SIADH.  Started on isotonic maintenance fluids with improvement in serum sodium to 126.  Careful not to overcorrect.  SIADH is another possibility.  Hyponatremia and SIADH is a rare but serious complication of amiodarone described in the literature.  Patient serum sodium improved back into reference range at time of discharge.  IV fluids discontinued at this time.  Suspected etiology of patient's hyponatremia was delusional from excess free water consumption.  Patient states that he drinks 4-5 large bottles of water today.  I have instructed him to cut this in half.  He expressed understanding.  He will follow up with his primary care doctor within 1 week for repeat sodium check and further evaluation and management.  Very low suspicion for amiodarone as the cause of hyponatremia as this triggers SIADH which is usually unresponsive to fluid resuscitation.  Patient ambulated prior to discharge with no  distress.  Discharge Diagnoses:  Active Problems:   Hyponatremia  1.    Hypoosmolar hypotonic hyponatremia Felt to be delusional in nature as patient was drinking too much free water.  Instructed him to cut this dose in half and recheck labs in 1 week.  Patient was maintained on normal saline for elective hospitalization with correction of serum sodium to reference range and improvement in patient's symptoms.  Physical therapy was consulted but patient was baseline ambulatory so no PT follow-up was recommended.  2.  Hypokalemia. Stable time of discharge.  Repleted during admission  3.  Paroxysmal atrial fibrillation with controlled ventricular response. -We will continue amiodarone and Eliquis. -Amiodarone unlikely to be cause of hyponatremia  4.  Dyslipidemia. -We will continue statin therapy and Zetia.  5.  Coronary artery disease. -We will continue  beta-blocker therapy, Imdur and statin therapy.  6.  Type 2 diabetes mellitus. -We will place him on supplemental coverage with subcutaneous NovoLog.  7.  Hypertension -We will continue Coreg and Entresto.  Discharge Instructions  Discharge Instructions    Diet - low sodium heart healthy   Complete by: As directed    Increase activity slowly   Complete by: As directed      Allergies as of 12/07/2019      Reactions   Ace Inhibitors Swelling   Morphine Swelling   Yellow Jacket Venom [bee Venom] Anaphylaxis      Medication List    TAKE these medications   amiodarone 200 MG tablet Commonly known as: PACERONE Take 200 mg by mouth as directed. Take 4 tablets qd for 1 week Take 3 tablets qd fpr 1 week Take 2 tablets qd for 1 week Take 1  tablet qd   apixaban 5 MG Tabs tablet Commonly known as: ELIQUIS Take 5 mg by mouth 2 (two) times daily.   atorvastatin 80 MG tablet Commonly known as: Lipitor Take 1 tablet (80 mg total) by mouth daily.   buPROPion 300 MG 24 hr tablet Commonly known as: WELLBUTRIN XL Take  300 mg by mouth daily.   carvedilol 25 MG tablet Commonly known as: Coreg Take 1 tablet (25 mg total) by mouth 2 (two) times daily.   Entresto 49-51 MG Generic drug: sacubitril-valsartan Take 1 tablet by mouth 2 (two) times daily.   ezetimibe 10 MG tablet Commonly known as: ZETIA Take 10 mg by mouth daily.   gabapentin 300 MG capsule Commonly known as: NEURONTIN Take 300 mg by mouth 2 (two) times daily.   metFORMIN 500 MG tablet Commonly known as: GLUCOPHAGE Take 500 mg by mouth 2 (two) times daily.   pyridOXINE 100 MG tablet Commonly known as: VITAMIN B-6 Take 100 mg by mouth daily.   sertraline 25 MG tablet Commonly known as: ZOLOFT Take 25 mg by mouth daily.       Follow-up Information    [name], MD. Schedule an appointment as soon as possible for a visit in 1 week(s).   Specialty: Family Medicine Contact information: [address] [place] [address] [phone]              Allergies  Allergen Reactions  . Ace Inhibitors Swelling  . Morphine Swelling  . 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    Consultations:  none   Procedures/Studies:  No results found. (Echo, Carotid, EGD, Colonoscopy, ERCP)    Subjective: Seen and examined on time of discharge.  No distress feels well.  Stable for discharge home.  Discharge Exam: Vitals:   12/07/19 0400 12/07/19 1324  BP: 132/75 117/67  Pulse:  66  Resp: 12 16  Temp:    SpO2:  99%   Vitals:   12/07/19 0100 12/07/19 0200 12/07/19 0400 12/07/19 1324  BP:   132/75 117/67  Pulse:    66  Resp: 16 16 12 16   Temp:      TempSrc:      SpO2:    99%  Weight:      Height:        General: Pt is alert, awake, not in acute distress Cardiovascular: RRR, S1/S2 +, no rubs, no gallops Respiratory: CTA bilaterally, no wheezing, no rhonchi Abdominal: Soft, NT, ND, bowel sounds + Extremities: no edema, no cyanosis    The results of significant diagnostics from this hospitalization  (including imaging, microbiology, ancillary and laboratory) are listed below for reference.     Microbiology: Recent Results (from the past 240 hour(s))  Respiratory Panel by RT PCR (Flu A&B, Covid) - Nasopharyngeal Swab     Status: None   Collection Time: 12/06/19  1:41 AM   Specimen: Nasopharyngeal Swab  Result Value Ref Range Status   SARS Coronavirus 2 by RT PCR NEGATIVE NEGATIVE Final    Comment: (NOTE) SARS-CoV-2 target nucleic acids are NOT DETECTED.  The SARS-CoV-2 RNA is generally detectable in upper respiratoy specimens during the acute phase of infection. The lowest concentration of SARS-CoV-2 viral copies this assay can detect is 131 copies/mL. A negative result does not preclude SARS-Cov-2 infection and should not be used as the sole basis for treatment or other patient management decisions. A negative result may occur with  improper specimen collection/handling, submission of specimen other than nasopharyngeal swab, presence  of viral mutation(s) within the areas targeted by this assay, and inadequate number of viral copies (<131 copies/mL). A negative result must be combined with clinical observations, patient history, and epidemiological information. The expected result is Negative.  Fact Sheet for Patients:  https://www.moore.com/https://www.fda.gov/media/142436/download  Fact Sheet for Healthcare Providers:  https://www.young.biz/https://www.fda.gov/media/142435/download  This test is no t yet approved or cleared by the [place] FDA and  has been authorized for detection and/or diagnosis of SARS-CoV-2 by FDA under an Emergency Use Authorization (EUA). This EUA will remain  in effect (meaning this test can be used) for the duration of the COVID-19 declaration under Section 564(b)(1) of the Act, 21 U.S.C. section 360bbb-3(b)(1), unless the authorization is terminated or revoked sooner.     Influenza A by PCR NEGATIVE NEGATIVE Final   Influenza B by PCR NEGATIVE NEGATIVE Final    Comment:  (NOTE) The Xpert Xpress SARS-CoV-2/FLU/RSV assay is intended as an aid in  the diagnosis of influenza from Nasopharyngeal swab specimens and  should not be used as a sole basis for treatment. Nasal washings and  aspirates are unacceptable for Xpert Xpress SARS-CoV-2/FLU/RSV  testing.  Fact Sheet for Patients: https://www.moore.com/https://www.fda.gov/media/142436/download  Fact Sheet for Healthcare Providers: https://www.young.biz/https://www.fda.gov/media/142435/download  This test is not yet approved or cleared by the [place] FDA and  has been authorized for detection and/or diagnosis of SARS-CoV-2 by  FDA under an Emergency Use Authorization (EUA). This EUA will remain  in effect (meaning this test can be used) for the duration of the  Covid-19 declaration under Section 564(b)(1) of the Act, 21  U.S.C. section 360bbb-3(b)(1), unless the authorization is  terminated or revoked. Performed at [facility], [address] [place], [place] [address]      Labs: BNP (last 3 results) No results for input(s): BNP in the last 8760 hours. Basic Metabolic Panel: Recent Labs  Lab 12/05/19 1920 12/06/19 0352 12/06/19 1317 12/06/19 2155 12/07/19 0345  NA 122* 126* 126* 129* 136  K 3.4* 3.2*  --   --  4.2  CL 85* 92*  --   --  102  CO2 25 24  --   --  27  GLUCOSE 109* 106*  --   --  106*  BUN 12 10  --   --  11  CREATININE 0.77 0.66  --   --  0.91  CALCIUM 9.5 8.8*  --   --  9.0  MG  --  1.5*  --   --  2.1   Liver Function Tests: No results for input(s): AST, ALT, ALKPHOS, BILITOT, PROT, ALBUMIN in the last 168 hours. No results for input(s): LIPASE, AMYLASE in the last 168 hours. No results for input(s): AMMONIA in the last 168 hours. CBC: Recent Labs  Lab 12/05/19 1920 12/06/19 0352  WBC 10.0 9.6  HGB 16.8 14.8  HCT 46.2 42.0  MCV 86.2 88.4  PLT 267 223   Cardiac Enzymes: No results for input(s): CKTOTAL, CKMB, CKMBINDEX, TROPONINI in the last 168 hours. BNP: Invalid input(s):  POCBNP CBG: Recent Labs  Lab 12/06/19 0847 12/06/19 1237 12/06/19 1841 12/06/19 2159 12/07/19 0820  GLUCAP 223* 109* 115* 126* 90   D-Dimer No results for input(s): DDIMER in the last 72 hours. Hgb A1c Recent Labs    12/06/19 0352  HGBA1C 6.0*   Lipid Profile No results for input(s): CHOL, HDL, LDLCALC, TRIG, CHOLHDL, LDLDIRECT in the last 72 hours. Thyroid function studies Recent Labs    12/06/19 0145  TSH 1.488  Anemia work up No results for input(s): VITAMINB12, FOLATE, FERRITIN, TIBC, IRON, RETICCTPCT in the last 72 hours. Urinalysis    Component Value Date/Time   COLORURINE YELLOW (A) 12/05/2019 1920   APPEARANCEUR CLEAR (A) 12/05/2019 1920   LABSPEC 1.012 12/05/2019 1920   PHURINE 6.0 12/05/2019 1920   GLUCOSEU NEGATIVE 12/05/2019 1920   HGBUR NEGATIVE 12/05/2019 1920   BILIRUBINUR NEGATIVE 12/05/2019 1920   KETONESUR NEGATIVE 12/05/2019 1920   PROTEINUR NEGATIVE 12/05/2019 1920   NITRITE NEGATIVE 12/05/2019 1920   LEUKOCYTESUR NEGATIVE 12/05/2019 1920   Sepsis Labs Invalid input(s): PROCALCITONIN,  WBC,  LACTICIDVEN Microbiology Recent Results (from the past 240 hour(s))  Respiratory Panel by RT PCR (Flu A&B, Covid) - Nasopharyngeal Swab     Status: None   Collection Time: 12/06/19  1:41 AM   Specimen: Nasopharyngeal Swab  Result Value Ref Range Status   SARS Coronavirus 2 by RT PCR NEGATIVE NEGATIVE Final    Comment: (NOTE) SARS-CoV-2 target nucleic acids are NOT DETECTED.  The SARS-CoV-2 RNA is generally detectable in upper respiratoy specimens during the acute phase of infection. The lowest concentration of SARS-CoV-2 viral copies this assay can detect is 131 copies/mL. A negative result does not preclude SARS-Cov-2 infection and should not be used as the sole basis for treatment or other patient management decisions. A negative result may occur with  improper specimen collection/handling, submission of specimen other than nasopharyngeal swab,  presence of viral mutation(s) within the areas targeted by this assay, and inadequate number of viral copies (<131 copies/mL). A negative result must be combined with clinical observations, patient history, and epidemiological information. The expected result is Negative.  Fact Sheet for Patients:  https://www.moore.com/  Fact Sheet for Healthcare Providers:  https://www.young.biz/  This test is no t yet approved or cleared by the [place] FDA and  has been authorized for detection and/or diagnosis of SARS-CoV-2 by FDA under an Emergency Use Authorization (EUA). This EUA will remain  in effect (meaning this test can be used) for the duration of the COVID-19 declaration under Section 564(b)(1) of the Act, 21 U.S.C. section 360bbb-3(b)(1), unless the authorization is terminated or revoked sooner.     Influenza A by PCR NEGATIVE NEGATIVE Final   Influenza B by PCR NEGATIVE NEGATIVE Final    Comment: (NOTE) The Xpert Xpress SARS-CoV-2/FLU/RSV assay is intended as an aid in  the diagnosis of influenza from Nasopharyngeal swab specimens and  should not be used as a sole basis for treatment. Nasal washings and  aspirates are unacceptable for Xpert Xpress SARS-CoV-2/FLU/RSV  testing.  Fact Sheet for Patients: https://www.moore.com/  Fact Sheet for Healthcare Providers: https://www.young.biz/  This test is not yet approved or cleared by the [place] FDA and  has been authorized for detection and/or diagnosis of SARS-CoV-2 by  FDA under an Emergency Use Authorization (EUA). This EUA will remain  in effect (meaning this test can be used) for the duration of the  Covid-19 declaration under Section 564(b)(1) of the Act, 21  U.S.C. section 360bbb-3(b)(1), unless the authorization is  terminated or revoked. Performed at [facility], [address] [place], [place] [address]      Time  coordinating discharge: Over 30 minutes  SIGNED:   Tresa Moore, MD  [facility] 12/07/2019, 1:46 PM Pager   If 7PM-7AM, please contact night-coverage

## 2019-12-07 NOTE — ED Notes (Signed)
Report to jennifer, rn.  

## 2019-12-07 NOTE — Progress Notes (Signed)
PT Cancellation Note  Patient Details Name: Devin [name] [facility]. MRN: [id number] DOB: [birth year]   Cancelled Treatment:    Reason Eval/Treat Not Completed: Other (comment).  PT order cancelled by MD 9/27.  Please re-consult PT if acute PT needs are identified.  Hendricks Limes, PT 12/07/19, 12:48 PM

## 2019-12-07 NOTE — Discharge Instructions (Signed)
Hyponatremia Hyponatremia is when the amount of salt (sodium) in your blood is too low. When sodium levels are low, your cells absorb extra water, which causes them to swell. The swelling happens throughout the body, but it mostly affects the brain. What are the causes? This condition may be caused by:  Certain medical conditions, such as: ? Heart, kidney, or liver problems. ? Thyroid problems. ? Adrenal gland problems. ? Metabolic conditions, such as Addison disease or syndrome of inappropriate antidiuretic hormone (SIADH).  Severe vomiting or diarrhea.  Certain medicines or illegal drugs.  Dehydration.  Drinking too much water.  Eating a diet that is low in sodium.  Large burns on your body.  Excessive sweating. What increases the risk? You are more likely to develop this condition if you:  Have long-term (chronic) kidney disease.  Have heart failure.  Have a medical condition that causes frequent or excessive diarrhea.  Participate in intense physical activities, such as marathon running.  Take certain medicines that affect the sodium and fluid balance in the blood. Some of these medicine types include: ? Diuretics. ? NSAIDs. ? Some opioid pain medicines. ? Some antidepressants. ? Some seizure prevention medicines. What are the signs or symptoms? Symptoms of this condition include:  Headache.  Nausea and vomiting.  Being very tired (lethargic).  Muscle weakness and cramping.  Loss of appetite.  Feeling weak or light-headed. Severe symptoms of this condition include:  Confusion.  Agitation.  Having a rapid heart rate.  Passing out (fainting).  Seizures.  Coma. How is this diagnosed? This condition is diagnosed based on:  A physical exam.  Your medical history.  Tests, including: ? Blood tests. ? Urine tests. How is this treated? Treatment for this condition depends on the cause. Treatment may include:  Getting fluids through an IV  that is inserted into one of your veins.  Medicines to correct the sodium imbalance. If medicines are causing the condition, the medicines will need to be adjusted.  Limiting your water or fluid intake to get the correct sodium balance.  Monitoring in the hospital setting to closely watch your symptoms for improvement. Follow these instructions at home:   Take over-the-counter and prescription medicines only as told by your health care provider. Many medicines can make this condition worse. Talk with your health care provider about any medicines that you are currently taking.  Carefully follow a recommended diet as told by your health care provider.  Carefully follow instructions from your health care provider about fluid restrictions.  Do not drink alcohol.  Keep all follow-up visits as told by your health care provider. This is important. Contact a health care provider if:  You develop worsening nausea, fatigue, headache, confusion, or weakness.  Your symptoms go away and then return.  You have problems following the recommended diet. Get help right away if:  You have a seizure.  You pass out.  You have ongoing diarrhea or vomiting. Summary  Hyponatremia is when the amount of salt (sodium) in your blood is too low.  When sodium levels are low, your cells absorb extra water, which causes them to swell.  The swelling happens throughout the body, but it mostly affects the brain.  Treatment for this condition depends on the cause. It may include IV fluids, medicines, and limiting your fluid intake. This information is not intended to replace advice given to you by your health care provider. Make sure you discuss any questions you have with your health care provider. Document   Revised: 01/10/2018 Document Reviewed: 01/10/2018 Elsevier Patient Education  2020 [employer]  

## 2019-12-13 ENCOUNTER — Encounter: Payer: Self-pay | Admitting: Emergency Medicine

## 2019-12-13 ENCOUNTER — Inpatient Hospital Stay
Admission: EM | Admit: 2019-12-13 | Discharge: 2019-12-15 | DRG: 291 | Disposition: A | Discharge status: Home / Self Care | Payer: Medicare HMO | Attending: Internal Medicine | Admitting: Internal Medicine

## 2019-12-13 ENCOUNTER — Emergency Department: Payer: Medicare HMO

## 2019-12-13 ENCOUNTER — Other Ambulatory Visit: Payer: Self-pay

## 2019-12-13 DIAGNOSIS — F32A Depression, unspecified: Secondary | ICD-10-CM | POA: Diagnosis present

## 2019-12-13 DIAGNOSIS — I252 Old myocardial infarction: Secondary | ICD-10-CM | POA: Diagnosis not present

## 2019-12-13 DIAGNOSIS — Z20822 Contact with and (suspected) exposure to covid-19: Secondary | ICD-10-CM | POA: Diagnosis present

## 2019-12-13 DIAGNOSIS — Z888 Allergy status to other drugs, medicaments and biological substances status: Secondary | ICD-10-CM

## 2019-12-13 DIAGNOSIS — I480 Paroxysmal atrial fibrillation: Secondary | ICD-10-CM | POA: Diagnosis present

## 2019-12-13 DIAGNOSIS — Z7984 Long term (current) use of oral hypoglycemic drugs: Secondary | ICD-10-CM | POA: Diagnosis not present

## 2019-12-13 DIAGNOSIS — Z951 Presence of aortocoronary bypass graft: Secondary | ICD-10-CM | POA: Diagnosis not present

## 2019-12-13 DIAGNOSIS — E871 Hypo-osmolality and hyponatremia: Secondary | ICD-10-CM | POA: Diagnosis present

## 2019-12-13 DIAGNOSIS — Z7901 Long term (current) use of anticoagulants: Secondary | ICD-10-CM

## 2019-12-13 DIAGNOSIS — I2510 Atherosclerotic heart disease of native coronary artery without angina pectoris: Secondary | ICD-10-CM | POA: Diagnosis present

## 2019-12-13 DIAGNOSIS — I110 Hypertensive heart disease with heart failure: Principal | ICD-10-CM | POA: Diagnosis present

## 2019-12-13 DIAGNOSIS — I5021 Acute systolic (congestive) heart failure: Secondary | ICD-10-CM | POA: Diagnosis present

## 2019-12-13 DIAGNOSIS — Z955 Presence of coronary angioplasty implant and graft: Secondary | ICD-10-CM

## 2019-12-13 DIAGNOSIS — I509 Heart failure, unspecified: Secondary | ICD-10-CM | POA: Diagnosis not present

## 2019-12-13 DIAGNOSIS — R0902 Hypoxemia: Secondary | ICD-10-CM | POA: Diagnosis present

## 2019-12-13 DIAGNOSIS — Z79899 Other long term (current) drug therapy: Secondary | ICD-10-CM

## 2019-12-13 DIAGNOSIS — K219 Gastro-esophageal reflux disease without esophagitis: Secondary | ICD-10-CM | POA: Diagnosis present

## 2019-12-13 DIAGNOSIS — Z91030 Bee allergy status: Secondary | ICD-10-CM

## 2019-12-13 DIAGNOSIS — Z96653 Presence of artificial knee joint, bilateral: Secondary | ICD-10-CM | POA: Diagnosis present

## 2019-12-13 DIAGNOSIS — Z885 Allergy status to narcotic agent status: Secondary | ICD-10-CM

## 2019-12-13 DIAGNOSIS — Z87891 Personal history of nicotine dependence: Secondary | ICD-10-CM | POA: Diagnosis not present

## 2019-12-13 DIAGNOSIS — E119 Type 2 diabetes mellitus without complications: Secondary | ICD-10-CM | POA: Diagnosis present

## 2019-12-13 DIAGNOSIS — G4733 Obstructive sleep apnea (adult) (pediatric): Secondary | ICD-10-CM | POA: Diagnosis present

## 2019-12-13 LAB — GLUCOSE, CAPILLARY: Glucose-Capillary: 95 mg/dL (ref 70–99)

## 2019-12-13 LAB — CBC WITH DIFFERENTIAL/PLATELET
Abs Immature Granulocytes: 0.05 10*3/uL (ref 0.00–0.07)
Basophils Absolute: 0 10*3/uL (ref 0.0–0.1)
Basophils Relative: 0 %
Eosinophils Absolute: 0.1 10*3/uL (ref 0.0–0.5)
Eosinophils Relative: 1 %
HCT: 41.8 % (ref 39.0–52.0)
Hemoglobin: 14.6 g/dL (ref 13.0–17.0)
Immature Granulocytes: 1 %
Lymphocytes Relative: 10 %
Lymphs Abs: 1.1 10*3/uL (ref 0.7–4.0)
MCH: 31.5 pg (ref 26.0–34.0)
MCHC: 34.9 g/dL (ref 30.0–36.0)
MCV: 90.3 fL (ref 80.0–100.0)
Monocytes Absolute: 1 10*3/uL (ref 0.1–1.0)
Monocytes Relative: 9 %
Neutro Abs: 8.8 10*3/uL — ABNORMAL HIGH (ref 1.7–7.7)
Neutrophils Relative %: 79 %
Platelets: 260 10*3/uL (ref 150–400)
RBC: 4.63 MIL/uL (ref 4.22–5.81)
RDW: 15.7 % — ABNORMAL HIGH (ref 11.5–15.5)
WBC: 11.1 10*3/uL — ABNORMAL HIGH (ref 4.0–10.5)
nRBC: 0 % (ref 0.0–0.2)

## 2019-12-13 LAB — COMPREHENSIVE METABOLIC PANEL
ALT: 29 U/L (ref 0–44)
AST: 23 U/L (ref 15–41)
Albumin: 4 g/dL (ref 3.5–5.0)
Alkaline Phosphatase: 57 U/L (ref 38–126)
Anion gap: 10 (ref 5–15)
BUN: 11 mg/dL (ref 8–23)
CO2: 24 mmol/L (ref 22–32)
Calcium: 9 mg/dL (ref 8.9–10.3)
Chloride: 99 mmol/L (ref 98–111)
Creatinine, Ser: 0.83 mg/dL (ref 0.61–1.24)
GFR calc Af Amer: >60 mL/min (ref >60)
GFR calc non Af Amer: >60 mL/min (ref >60)
Glucose, Bld: 103 mg/dL — ABNORMAL HIGH (ref 70–99)
Potassium: 4.5 mmol/L (ref 3.5–5.1)
Sodium: 133 mmol/L — ABNORMAL LOW (ref 135–145)
Total Bilirubin: 1.2 mg/dL (ref 0.3–1.2)
Total Protein: 7.4 g/dL (ref 6.5–8.1)

## 2019-12-13 LAB — TROPONIN I (HIGH SENSITIVITY): Troponin I (High Sensitivity): 5 ng/L (ref <18)

## 2019-12-13 LAB — RESPIRATORY PANEL BY RT PCR (FLU A&B, COVID)
Influenza A by PCR: NEGATIVE
Influenza B by PCR: NEGATIVE
SARS Coronavirus 2 by RT PCR: NEGATIVE

## 2019-12-13 LAB — BRAIN NATRIURETIC PEPTIDE: B Natriuretic Peptide: 771.2 pg/mL — ABNORMAL HIGH (ref 0.0–100.0)

## 2019-12-13 MED ORDER — ONDANSETRON HCL 4 MG/2ML IJ SOLN: 4.0000 mg | Freq: Four times a day (QID) | INTRAMUSCULAR | Status: DC | PRN

## 2019-12-13 MED ORDER — INSULIN ASPART 100 UNIT/ML ~~LOC~~ SOLN
0.0000 [IU] | Freq: Three times a day (TID) | SUBCUTANEOUS | Status: DC
  Administered 2019-12-14: 3 [IU] via SUBCUTANEOUS
  Filled 2019-12-13: qty 1

## 2019-12-13 MED ORDER — ACETAMINOPHEN 325 MG PO TABS: 650.0000 mg | ORAL_TABLET | ORAL | Status: DC | PRN

## 2019-12-13 MED ORDER — SODIUM CHLORIDE 0.9 % IV SOLN: 250.0000 mL | INTRAVENOUS | Status: DC | PRN

## 2019-12-13 MED ORDER — FUROSEMIDE 10 MG/ML IJ SOLN
40.0000 mg | Freq: Every day | INTRAMUSCULAR | Status: DC
  Administered 2019-12-14 – 2019-12-15 (×2): 40 mg via INTRAVENOUS
  Filled 2019-12-13 (×2): qty 4

## 2019-12-13 MED ORDER — SODIUM CHLORIDE 0.9% FLUSH: 3.0000 mL | INTRAVENOUS | Status: DC | PRN

## 2019-12-13 MED ORDER — FUROSEMIDE 10 MG/ML IJ SOLN
20.0000 mg | Freq: Once | INTRAMUSCULAR | Status: AC
  Administered 2019-12-13: 20 mg via INTRAVENOUS
  Filled 2019-12-13: qty 4

## 2019-12-13 MED ORDER — SODIUM CHLORIDE 0.9% FLUSH
3.0000 mL | Freq: Two times a day (BID) | INTRAVENOUS | Status: DC
  Administered 2019-12-13 – 2019-12-15 (×4): 3 mL via INTRAVENOUS

## 2019-12-13 MED ORDER — MELATONIN 5 MG PO TABS
2.5000 mg | ORAL_TABLET | Freq: Every day | ORAL | Status: DC
  Administered 2019-12-14 (×2): 2.5 mg via ORAL
  Filled 2019-12-13: qty 1
  Filled 2019-12-13 (×2): qty 0.5

## 2019-12-13 MED ORDER — INSULIN ASPART 100 UNIT/ML ~~LOC~~ SOLN: 0.0000 [IU] | Freq: Every day | SUBCUTANEOUS | Status: DC

## 2019-12-13 NOTE — ED Provider Notes (Signed)
[facility] Emergency Department Provider Note   ____________________________________________   First MD Initiated Contact with Patient 12/13/19 1509     (approximate)  I have reviewed the triage vital signs and the nursing notes.   HISTORY  Chief Complaint Weakness    HPI Devin [name]. is a 74 y.o. male who reports last night he was walking and became short of breath.  It was fairly rapid onset.  He has been short of breath since then.  Seems to be worse with exertion a little bit better if he rests but does not go away completely.  Is not running a fever.  He is not coughing.  He does not have any pain or tightness in his chest no pain or tightness with deep breathing either.  His symptoms remind him of when he was here with hyponatremia recently.         Past Medical History:  Diagnosis Date  . CAD (coronary artery disease)   . Diabetes mellitus without complication (HCC)   . Hypertension   . MI, old   . Sleep apnea     Patient Active Problem List   Diagnosis Date Noted  . Hyponatremia 12/06/2019  . Chest pain 05/21/2019  . Unstable angina (HCC)   . Ischemic chest pain (HCC) 05/16/2019  . CAD (coronary artery disease) 05/16/2019  . Type 2 diabetes mellitus without complication (HCC) 05/16/2019  . Essential hypertension 05/16/2019  . Depression 05/16/2019  . GERD (gastroesophageal reflux disease) 05/16/2019    Past Surgical History:  Procedure Laterality Date  . APPENDECTOMY    . BREAST SURGERY     benign mass  . CORONARY ANGIOPLASTY WITH STENT PLACEMENT    . LEFT HEART CATH AND CORS/GRAFTS ANGIOGRAPHY N/A 05/21/2019   Procedure: LEFT HEART CATH AND CORONARY ANGIOGRAPHY;  Surgeon: Laurier Nancy, MD;  Location: [facility] INVASIVE CV LAB;  Service: Cardiovascular;  Laterality: N/A;  . REPLACEMENT TOTAL KNEE BILATERAL      Prior to Admission medications   Medication Sig Start Date End Date Taking? Authorizing Provider   amiodarone (PACERONE) 200 MG tablet Take 200 mg by mouth as directed. Take 4 tablets qd for 1 week Take 3 tablets qd fpr 1 week Take 2 tablets qd for 1 week Take 1 tablet qd 11/26/19   [provider]  apixaban (ELIQUIS) 5 MG TABS tablet Take 5 mg by mouth 2 (two) times daily.  06/05/19 12/06/19  [provider]  atorvastatin (LIPITOR) 80 MG tablet Take 1 tablet (80 mg total) by mouth daily. 05/16/19 05/15/20  Lucile Shutters, MD  buPROPion (WELLBUTRIN XL) 300 MG 24 hr tablet Take 300 mg by mouth daily.     [provider]  carvedilol (COREG) 25 MG tablet Take 1 tablet (25 mg total) by mouth 2 (two) times daily. 05/16/19 05/15/20  Agbata, Tochukwu, MD  ENTRESTO 49-51 MG Take 1 tablet by mouth 2 (two) times daily. 11/09/19   [provider]  ezetimibe (ZETIA) 10 MG tablet Take 10 mg by mouth daily. 04/06/19   [provider]  gabapentin (NEURONTIN) 300 MG capsule Take 300 mg by mouth 2 (two) times daily. 05/01/19   [provider]  metFORMIN (GLUCOPHAGE) 500 MG tablet Take 500 mg by mouth 2 (two) times daily. 02/17/19   [provider]  pyridOXINE (VITAMIN B-6) 100 MG tablet Take 100 mg by mouth daily.    [provider]  sertraline (ZOLOFT) 25 MG tablet Take 25 mg by  mouth daily. 11/27/19   [provider]  benazepril (LOTENSIN) 20 MG tablet Take 20 mg by mouth daily. 03/19/19 08/13/19  [provider]    Allergies Ace inhibitors, Morphine, and Yellow jacket venom [bee venom]  Family History  Problem Relation Age of Onset  . Osteoarthritis Mother   . Other Father 46       MVA    Social History Social History   Tobacco Use  . Smoking status: Former Games developer  . Smokeless tobacco: Never Used  . Tobacco comment: Quit over 40 years ago  Vaping Use  . Vaping Use: Never used  Substance Use Topics  . Alcohol use: Not Currently    Comment: social  . Drug use: Never    Review of Systems Constitutional: No  fever/chills Eyes: No visual changes. ENT: No sore throat. Cardiovascular: Denies chest pain. Respiratory:shortness of breath. Gastrointestinal: No abdominal pain.  No nausea, no vomiting.  No diarrhea.  No constipation. Genitourinary: Negative for dysuria. Musculoskeletal: Negative for back pain. Skin: Negative for rash. Neurological: Negative for headaches, focal weakness  ____________________________________________   PHYSICAL EXAM:  VITAL SIGNS: ED Triage Vitals  Enc Vitals Group     BP 12/13/19 1507 (!) 156/94     Pulse Rate 12/13/19 1507 66     Resp 12/13/19 1507 20     Temp 12/13/19 1507 98.3 F (36.8 C)     Temp Source 12/13/19 1507 Oral     SpO2 12/13/19 1507 94 %     Weight 12/13/19 1508 208 lb (94.3 kg)     Height 12/13/19 1508 6' (1.829 m)     Head Circumference --      Peak Flow --      Pain Score 12/13/19 1508 0     Pain Loc --      Pain Edu? --      Excl. in GC? --     Constitutional: Alert and oriented. Well appearing and in no acute distress. Eyes: Conjunctivae are normal.  Head: Atraumatic. Nose: No congestion/rhinnorhea. Mouth/Throat: Mucous membranes are moist.  Oropharynx non-erythematous. Neck: No stridor.   Cardiovascular: Normal rate, regular rhythm. Grossly normal heart sounds.  Good peripheral circulation. Respiratory: Normal respiratory effort.  No retractions. Lungs CTAB. Gastrointestinal: Soft and nontender. No distention. No abdominal bruits. No CVA tenderness. Musculoskeletal: No lower extremity tenderness mild less than 1+ edema bilaterally edema.   Neurologic:  Normal speech and language. No gross focal neurologic deficits are appreciated. . Skin:  Skin is warm, dry and intact. No rash noted.  ____________________________________________   LABS (all labs ordered are listed, but only abnormal results are displayed)  Labs Reviewed  COMPREHENSIVE METABOLIC PANEL - Abnormal; Notable for the following components:      Result Value    Sodium 133 (*)    Glucose, Bld 103 (*)    All other components within normal limits  BRAIN NATRIURETIC PEPTIDE - Abnormal; Notable for the following components:   B Natriuretic Peptide 771.2 (*)    All other components within normal limits  CBC WITH DIFFERENTIAL/PLATELET - Abnormal; Notable for the following components:   WBC 11.1 (*)    RDW 15.7 (*)    Neutro Abs 8.8 (*)    All other components within normal limits  RESPIRATORY PANEL BY RT PCR (FLU A&B, COVID)  TROPONIN I (HIGH SENSITIVITY)  TROPONIN I (HIGH SENSITIVITY)   ____________________________________________  EKG  EKG read interpreted by me shows A. fib at a rate of 72 normal  axis no acute ST-T wave changes ____________________________________________  RADIOLOGY Jill Poling, personally viewed and evaluated these images (plain radiographs) as part of my medical decision making, as well as reviewing the written report by the radiologist. Chest x-ray read by radiology reviewed by me shows some haziness which appears to be CHF to both the radiologist and myself. ED MD interpretation: =  Official radiology report(s): DG Chest 2 View  Result Date: 12/13/2019 CLINICAL DATA:  Weakness and shortness of breath on exertion. Hyponatremia. EXAM: CHEST - 2 VIEW COMPARISON:  Single-view of the chest 05/20/2019. FINDINGS: There are hazy bilateral pulmonary opacities in the mid and lower lung zones with an appearance most suggestive of interstitial edema. The patient is status post CABG. Aortic atherosclerosis. Heart size normal. Neural stimulator device noted. IMPRESSION: Hazy bilateral pulmonary opacities most suggestive of pulmonary edema. Aortic Atherosclerosis (ICD10-I70.0). Electronically Signed   By: Drusilla Kanner M.D.   On: 12/13/2019 16:34    ____________________________________________   PROCEDURES  Procedure(s) performed (including Critical  Care):  Procedures   ____________________________________________   INITIAL IMPRESSION / ASSESSMENT AND PLAN / ED COURSE  Patient's BNP is elevated his chest x-ray looks like CHF.  He feels weak and short of breath.  We will give him some Lasix and see if he can ambulate without the satting.  He is already on DOAC for his A. fib.  His troponin is not elevated.  If he can ambulate without difficulty and without having any chest tightness etc. I will see if he can follow-up outpatient otherwise he will need to come in again.  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  ----------------------------------------- 6:12 PM on 12/13/2019 -----------------------------------------  Patient got 20 IV Lasix put out over 600 cc of fluid but is still short of breath with exertion and desats to 88 just walking around in the emergency room.  This congestive heart failure appears to be new from reviewing his records.  He is already on Eliquis for his A. fib his troponins are stable so it is unlikely to be new heart trouble just worsening contractility probably in buildup of fluids..  Get him in the hospital get a cardiac echo etc.         ____________________________________________   FINAL CLINICAL IMPRESSION(S) / ED DIAGNOSES  Final diagnoses:  Acute congestive heart failure, unspecified heart failure type [facility])  Hypoxia     ED Discharge Orders    None      *Please note:  Mack Alvidrez. was evaluated in Emergency Department on 12/13/2019 for the symptoms described in the history of present illness. He was evaluated in the context of the global COVID-19 pandemic, which necessitated consideration that the patient might be at risk for infection with the SARS-CoV-2 virus that causes COVID-19. Institutional protocols and algorithms that pertain to the evaluation of patients at risk for COVID-19 are in a state of rapid change based on information released by regulatory bodies including the  CDC and federal and state organizations. These policies and algorithms were followed during the patient's care in the ED.  Some ED evaluations and interventions may be delayed as a result of limited staffing during and the pandemic.*   Note:  This document was prepared using Dragon voice recognition software and may include unintentional dictation errors.    Arnaldo Natal, MD 12/13/19 [phone]

## 2019-12-13 NOTE — H&P (Signed)
History and Physical   [facility] [place] @ [facility] Admission History and Physical [employer], D.O.    Patient Name: Devin [name] MR#: [id number] Date of Birth: Oct 15, 1945 Date of Admission: 12/13/2019  Referring MD/NP/PA: [name] Primary Care Physician: [name], MD  Chief Complaint:  Chief Complaint  Patient presents with  . Weakness    HPI: Devin [name] is a 74 y.o. 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  Patient was in a usual state of health until last night when he developed sudden onset of shortness of breath, dyspnea on exertion.  He also reports mild lower extremity edema.  Patient denies fevers/chills, weakness, dizziness, chest pain,  N/V/C/D, abdominal pain, dysuria/frequency, changes in mental status.    Otherwise there has been no change in status. Patient has been taking medication as prescribed and there has been no recent change in medication or diet.  No recent antibiotics.  There has been no recent illness, hospitalizations, travel or sick contacts.    Sees Dr. Welton Flakes for cardiology last visit was one week ago.    EMS/ED Course: Patient received Lasix. Medical admission has been requested for further management of new onset of CHF.  Review of Systems:  CONSTITUTIONAL: No fever/chills, fatigue, weakness, weight gain/loss, headache. EYES: No blurry or double vision. ENT: No tinnitus, postnasal drip, redness or soreness of the oropharynx. RESPIRATORY: Positive cough, dyspnea.  No hemoptysis.  CARDIOVASCULAR: No chest pain, palpitations, syncope, orthopnea. No lower extremity edema.  GASTROINTESTINAL: No nausea, vomiting, abdominal pain, diarrhea, constipation.  No hematemesis, melena or hematochezia. GENITOURINARY: No dysuria, frequency, hematuria. ENDOCRINE: No polyuria or nocturia. No heat or cold intolerance. HEMATOLOGY:  No anemia, bruising, bleeding. INTEGUMENTARY: No rashes, ulcers, lesions. MUSCULOSKELETAL: No arthritis, gout, dyspnea. NEUROLOGIC: No numbness, tingling, ataxia, seizure-type activity, weakness. PSYCHIATRIC: No anxiety, depression, insomnia.   Past Medical History:  Diagnosis Date  . CAD (coronary artery disease)   . Diabetes mellitus without complication (HCC)   . Hypertension   . MI, old   . Sleep apnea     Past Surgical History:  Procedure Laterality Date  . APPENDECTOMY    . BREAST SURGERY     benign mass  . CORONARY ANGIOPLASTY WITH STENT PLACEMENT    . LEFT HEART CATH AND CORS/GRAFTS ANGIOGRAPHY N/A 05/21/2019   Procedure: LEFT HEART CATH AND CORONARY ANGIOGRAPHY;  Surgeon: Laurier Nancy, MD;  Location: [facility] INVASIVE CV LAB;  Service: Cardiovascular;  Laterality: N/A;  . REPLACEMENT TOTAL KNEE BILATERAL       reports that he has quit smoking. He has never used smokeless tobacco. He reports previous alcohol use. He reports that he does not use drugs.  Allergies  Allergen Reactions  . Ace Inhibitors Swelling  . Morphine Swelling  . 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    Family History  Problem Relation Age of Onset  . Osteoarthritis Mother   . Other Father 64       MVA    Prior to Admission medications   Medication Sig Start Date End Date Taking? Authorizing Provider  amiodarone (PACERONE) 200 MG tablet Take 200-800 mg by mouth See admin instructions. Take 4 tablets ( ) by mouth daily for 1 week; take 3 tablets ( ) by mouth daily for 1 week; take 2 tablets ( ) by mouth daily for 1 week then take 1 tablet ( ) by mouth daily from  there on 11/26/19  Yes [provider]  apixaban (ELIQUIS) 5 MG TABS tablet Take 5 mg by mouth 2 (two) times daily.    Yes [provider]  atorvastatin (LIPITOR) 80 MG tablet Take 1 tablet (80 mg total) by mouth daily. 05/16/19 05/15/20 Yes Agbata, Tochukwu, MD  buPROPion (WELLBUTRIN XL) 300 MG 24 hr  tablet Take 300 mg by mouth daily.    Yes [provider]  carvedilol (COREG) 25 MG tablet Take 1 tablet (25 mg total) by mouth 2 (two) times daily. 05/16/19 05/15/20 Yes Agbata, Tochukwu, MD  ENTRESTO 49-51 MG Take 1 tablet by mouth 2 (two) times daily. 11/09/19  Yes [provider]  ezetimibe (ZETIA) 10 MG tablet Take 10 mg by mouth daily. 04/06/19  Yes [provider]  gabapentin (NEURONTIN) 300 MG capsule Take 300 mg by mouth 2 (two) times daily. 05/01/19  Yes [provider]  metFORMIN (GLUCOPHAGE) 500 MG tablet Take 500 mg by mouth 2 (two) times daily. 02/17/19  Yes [provider]  pyridOXINE (VITAMIN B-6) 100 MG tablet Take 100 mg by mouth daily.   Yes [provider]  sertraline (ZOLOFT) 25 MG tablet Take 25 mg by mouth daily. 11/27/19  Yes [provider]  benazepril (LOTENSIN) 20 MG tablet Take 20 mg by mouth daily. 03/19/19 08/13/19  [provider]    Physical Exam: Vitals:   12/13/19 1507 12/13/19 1508  BP: (!) 156/94   Pulse: 66   Resp: 20   Temp: 98.3 F (36.8 C)   TempSrc: Oral   SpO2: 94%   Weight:  94.3 kg  Height:  6' (1.829 m)    GENERAL: 74 y.o.-year-old white male patient, well-developed, well-nourished lying in the bed in no acute distress.  Pleasant and cooperative.   HEENT: Head atraumatic, normocephalic. Pupils equal. Mucus membranes moist. NECK: Supple. No JVD. CHEST: Normal breath sounds bilaterally. No wheezing, rales, rhonchi or crackles. No use of accessory muscles of respiration.  No reproducible chest wall tenderness.  CARDIOVASCULAR: Irregular.  S1, S2 normal. No murmurs, rubs, or gallops. Cap refill <2 seconds. Pulses intact distally.  ABDOMEN: Soft, nondistended, nontender. No rebound, guarding, rigidity. Normoactive bowel sounds present in all four quadrants.  EXTREMITIES: No pedal edema, cyanosis, or clubbing. No calf tenderness or Homan's sign.  NEUROLOGIC: The patient is alert and  oriented x 3. Cranial nerves II through XII are grossly intact with no focal sensorimotor deficit. PSYCHIATRIC:  Normal affect, mood, thought content. SKIN: Warm, dry, and intact without obvious rash, lesion, or ulcer.    Labs on Admission:  CBC: Recent Labs  Lab 12/13/19 1505  WBC 11.1*  NEUTROABS 8.8*  HGB 14.6  HCT 41.8  MCV 90.3  PLT 260   Basic Metabolic Panel: Recent Labs  Lab 12/06/19 2155 12/07/19 0345 12/13/19 1505  NA 129* 136 133*  K  --  4.2 4.5  CL  --  102 99  CO2  --  27 24  GLUCOSE  --  106* 103*  BUN  --  11 11  CREATININE  --  0.91 0.83  CALCIUM  --  9.0 9.0  MG  --  2.1  --    GFR: Estimated Creatinine Clearance: 91.7 mL/min (by C-G formula based on SCr of 0.83 mg/dL). Liver Function Tests: Recent Labs  Lab 12/13/19 1505  AST 23  ALT 29  ALKPHOS 57  BILITOT 1.2  PROT 7.4  ALBUMIN 4.0   No results for input(s): LIPASE, AMYLASE  in the last 168 hours. No results for input(s): AMMONIA in the last 168 hours. Coagulation Profile: No results for input(s): INR, PROTIME in the last 168 hours. Cardiac Enzymes: No results for input(s): CKTOTAL, CKMB, CKMBINDEX, TROPONINI in the last 168 hours. BNP (last 3 results) No results for input(s): PROBNP in the last 8760 hours. HbA1C: No results for input(s): HGBA1C in the last 72 hours. CBG: Recent Labs  Lab 12/06/19 2159 12/07/19 0820  GLUCAP 126* 90   Lipid Profile: No results for input(s): CHOL, HDL, LDLCALC, TRIG, CHOLHDL, LDLDIRECT in the last 72 hours. Thyroid Function Tests: No results for input(s): TSH, T4TOTAL, FREET4, T3FREE, THYROIDAB in the last 72 hours. Anemia Panel: No results for input(s): VITAMINB12, FOLATE, FERRITIN, TIBC, IRON, RETICCTPCT in the last 72 hours. Urine analysis:    Component Value Date/Time   COLORURINE YELLOW (A) 12/05/2019 1920   APPEARANCEUR CLEAR (A) 12/05/2019 1920   LABSPEC 1.012 12/05/2019 1920   PHURINE 6.0 12/05/2019 1920   GLUCOSEU NEGATIVE  12/05/2019 1920   HGBUR NEGATIVE 12/05/2019 1920   BILIRUBINUR NEGATIVE 12/05/2019 1920   KETONESUR NEGATIVE 12/05/2019 1920   PROTEINUR NEGATIVE 12/05/2019 1920   NITRITE NEGATIVE 12/05/2019 1920   LEUKOCYTESUR NEGATIVE 12/05/2019 1920   Sepsis Labs: @LABRCNTIP (procalcitonin:4,lacticidven:4) ) Recent Results (from the past 240 hour(s))  Respiratory Panel by RT PCR (Flu A&B, Covid) - Nasopharyngeal Swab     Status: None   Collection Time: 12/06/19  1:41 AM   Specimen: Nasopharyngeal Swab  Result Value Ref Range Status   SARS Coronavirus 2 by RT PCR NEGATIVE NEGATIVE Final    Comment: (NOTE) SARS-CoV-2 target nucleic acids are NOT DETECTED.  The SARS-CoV-2 RNA is generally detectable in upper respiratoy specimens during the acute phase of infection. The lowest concentration of SARS-CoV-2 viral copies this assay can detect is 131 copies/mL. A negative result does not preclude SARS-Cov-2 infection and should not be used as the sole basis for treatment or other patient management decisions. A negative result may occur with  improper specimen collection/handling, submission of specimen other than nasopharyngeal swab, presence of viral mutation(s) within the areas targeted by this assay, and inadequate number of viral copies (<131 copies/mL). A negative result must be combined with clinical observations, patient history, and epidemiological information. The expected result is Negative.  Fact Sheet for Patients:  12/08/19  Fact Sheet for Healthcare Providers:  https://www.moore.com/  This test is no t yet approved or cleared by the https://www.young.biz/ FDA and  has been authorized for detection and/or diagnosis of SARS-CoV-2 by FDA under an Emergency Use Authorization (EUA). This EUA will remain  in effect (meaning this test can be used) for the duration of the COVID-19 declaration under Section 564(b)(1) of the Act, 21 U.S.C. section  360bbb-3(b)(1), unless the authorization is terminated or revoked sooner.     Influenza A by PCR NEGATIVE NEGATIVE Final   Influenza B by PCR NEGATIVE NEGATIVE Final    Comment: (NOTE) The Xpert Xpress SARS-CoV-2/FLU/RSV assay is intended as an aid in  the diagnosis of influenza from Nasopharyngeal swab specimens and  should not be used as a sole basis for treatment. Nasal washings and  aspirates are unacceptable for Xpert Xpress SARS-CoV-2/FLU/RSV  testing.  Fact Sheet for Patients: [place]  Fact Sheet for Healthcare Providers: https://www.moore.com/  This test is not yet approved or cleared by the https://www.young.biz/ FDA and  has been authorized for detection and/or diagnosis of SARS-CoV-2 by  FDA under an Emergency Use Authorization (EUA). This  EUA will remain  in effect (meaning this test can be used) for the duration of the  Covid-19 declaration under Section 564(b)(1) of the Act, 21  U.S.C. section 360bbb-3(b)(1), unless the authorization is  terminated or revoked. Performed at [facility], [address] [place], [place] [address]   Respiratory Panel by RT PCR (Flu A&B, Covid) - Nasopharyngeal Swab     Status: None   Collection Time: 12/13/19  3:05 PM   Specimen: Nasopharyngeal Swab  Result Value Ref Range Status   SARS Coronavirus 2 by RT PCR NEGATIVE NEGATIVE Final    Comment: (NOTE) SARS-CoV-2 target nucleic acids are NOT DETECTED.  The SARS-CoV-2 RNA is generally detectable in upper respiratoy specimens during the acute phase of infection. The lowest concentration of SARS-CoV-2 viral copies this assay can detect is 131 copies/mL. A negative result does not preclude SARS-Cov-2 infection and should not be used as the sole basis for treatment or other patient management decisions. A negative result may occur with  improper specimen collection/handling, submission of specimen other than nasopharyngeal swab,  presence of viral mutation(s) within the areas targeted by this assay, and inadequate number of viral copies (<131 copies/mL). A negative result must be combined with clinical observations, patient history, and epidemiological information. The expected result is Negative.  Fact Sheet for Patients:  https://www.moore.com/https://www.fda.gov/media/142436/download  Fact Sheet for Healthcare Providers:  https://www.young.biz/https://www.fda.gov/media/142435/download  This test is no t yet approved or cleared by the [place] FDA and  has been authorized for detection and/or diagnosis of SARS-CoV-2 by FDA under an Emergency Use Authorization (EUA). This EUA will remain  in effect (meaning this test can be used) for the duration of the COVID-19 declaration under Section 564(b)(1) of the Act, 21 U.S.C. section 360bbb-3(b)(1), unless the authorization is terminated or revoked sooner.     Influenza A by PCR NEGATIVE NEGATIVE Final   Influenza B by PCR NEGATIVE NEGATIVE Final    Comment: (NOTE) The Xpert Xpress SARS-CoV-2/FLU/RSV assay is intended as an aid in  the diagnosis of influenza from Nasopharyngeal swab specimens and  should not be used as a sole basis for treatment. Nasal washings and  aspirates are unacceptable for Xpert Xpress SARS-CoV-2/FLU/RSV  testing.  Fact Sheet for Patients: https://www.moore.com/https://www.fda.gov/media/142436/download  Fact Sheet for Healthcare Providers: https://www.young.biz/https://www.fda.gov/media/142435/download  This test is not yet approved or cleared by the [place] FDA and  has been authorized for detection and/or diagnosis of SARS-CoV-2 by  FDA under an Emergency Use Authorization (EUA). This EUA will remain  in effect (meaning this test can be used) for the duration of the  Covid-19 declaration under Section 564(b)(1) of the Act, 21  U.S.C. section 360bbb-3(b)(1), unless the authorization is  terminated or revoked. Performed at [facility], [address] [place], [place] [address]       Radiological Exams on Admission: DG Chest 2 View  Result Date: 12/13/2019 CLINICAL DATA:  Weakness and shortness of breath on exertion. Hyponatremia. EXAM: CHEST - 2 VIEW COMPARISON:  Single-view of the chest 05/20/2019. FINDINGS: There are hazy bilateral pulmonary opacities in the mid and lower lung zones with an appearance most suggestive of interstitial edema. The patient is status post CABG. Aortic atherosclerosis. Heart size normal. Neural stimulator device noted. IMPRESSION: Hazy bilateral pulmonary opacities most suggestive of pulmonary edema. Aortic Atherosclerosis (ICD10-I70.0). Electronically Signed   By: Drusilla Kannerhomas  Dalessio M.D.   On: 12/13/2019 16:34    EKG: Rate controlled atrial fibrillation at 72 bpm with normal axis and nonspecific  ST-T wave changes.   Assessment/Plan  This is a 73 y.o. male with a history of fib, CAD s/p MI, DM, HTN, OSA, Depression, GERD now being admitted with:  #. New onset of congestive heart failure - Telemetry monitoring. - Beta blocker, ACE, diuretic, nitrate.  - Intake/output, daily weight. - Trend troponins, check lipids and TSH. - Echo - Cardiology consultation requested.   #. History of afib - Continue amiodarone, coreg, Eliquis   #. History of CAD - Continue Zetia, Lipitor  #. History of  Diabetes - Accuchecks achs with RISS coverage - Heart healthy, carb controlled diet - Hold metformin   #. History of HTN - Continue Entresto  #. History of depression - Continue Wellbutrin, Zoloft  Admission status: IP, tele IV Fluids: HL Diet/Nutrition: Heart healthy, carb controlled Consults called: Cardiology to be contacted in AM  DVT Px: Eliquis and early ambulation. Code Status: Full Code  Disposition Plan: To home in 1-2 days  All the records are reviewed and case discussed with ED provider. Management plans discussed with the patient and/or family who express understanding and agree with plan of care.  Donterius Filley D.O. on  12/13/2019 at 7:16 PM CC: Primary care physician; [name], MD   12/13/2019, 7:16 PM

## 2019-12-13 NOTE — ED Triage Notes (Signed)
Pt presents via acems with c/o weakness and shortness of breath with exertion. Pt recently seen here for hyponatremia, states these symptoms feel similar. Pt currently alert and oriented x4.

## 2019-12-13 NOTE — ED Notes (Signed)
Pt desaturating to 88% while ambulating. Pt oxygen quickly raises back to 96% on room air after stopping ambulation. MD Malinda notified.

## 2019-12-14 ENCOUNTER — Inpatient Hospital Stay
Admit: 2019-12-14 | Discharge: 2019-12-14 | Disposition: A | Discharge status: Home / Self Care | Payer: Medicare HMO | Attending: Family Medicine | Admitting: Family Medicine

## 2019-12-14 DIAGNOSIS — I509 Heart failure, unspecified: Secondary | ICD-10-CM

## 2019-12-14 DIAGNOSIS — R0902 Hypoxemia: Secondary | ICD-10-CM | POA: Insufficient documentation

## 2019-12-14 LAB — ECHOCARDIOGRAM COMPLETE
AR max vel: 2.34 cm2
AV Area VTI: 2.58 cm2
AV Area mean vel: 2.34 cm2
AV Mean grad: 5 mmHg
AV Peak grad: 9.6 mmHg
Ao pk vel: 1.55 m/s
Area-P 1/2: 4.93 cm2
Calc EF: 51.3 %
Height: 72 in
S' Lateral: 3.65 cm
Single Plane A2C EF: 46.8 %
Single Plane A4C EF: 55.7 %
Weight: 3327.99 oz

## 2019-12-14 LAB — GLUCOSE, CAPILLARY
Glucose-Capillary: 212 mg/dL — ABNORMAL HIGH (ref 70–99)
Glucose-Capillary: 179 mg/dL — ABNORMAL HIGH (ref 70–99)
Glucose-Capillary: 104 mg/dL — ABNORMAL HIGH (ref 70–99)
Glucose-Capillary: 109 mg/dL — ABNORMAL HIGH (ref 70–99)
Glucose-Capillary: 163 mg/dL — ABNORMAL HIGH (ref 70–99)

## 2019-12-14 LAB — TROPONIN I (HIGH SENSITIVITY)
Troponin I (High Sensitivity): 4 ng/L (ref <18)
Troponin I (High Sensitivity): 5 ng/L (ref <18)

## 2019-12-14 MED ORDER — GABAPENTIN 300 MG PO CAPS
300.0000 mg | ORAL_CAPSULE | Freq: Two times a day (BID) | ORAL | Status: DC
  Administered 2019-12-14 – 2019-12-15 (×3): 300 mg via ORAL
  Filled 2019-12-14 (×3): qty 1

## 2019-12-14 MED ORDER — METFORMIN HCL 500 MG PO TABS
500.0000 mg | ORAL_TABLET | Freq: Two times a day (BID) | ORAL | Status: DC
  Administered 2019-12-14: 500 mg via ORAL
  Filled 2019-12-14: qty 1

## 2019-12-14 MED ORDER — AMIODARONE HCL IN DEXTROSE 360-4.14 MG/200ML-% IV SOLN: 30.0000 mg/h | INTRAVENOUS | Status: DC

## 2019-12-14 MED ORDER — SERTRALINE HCL 50 MG PO TABS
25.0000 mg | ORAL_TABLET | Freq: Every day | ORAL | Status: DC
  Administered 2019-12-14 – 2019-12-15 (×2): 25 mg via ORAL
  Filled 2019-12-14 (×2): qty 1

## 2019-12-14 MED ORDER — ATORVASTATIN CALCIUM 80 MG PO TABS
80.0000 mg | ORAL_TABLET | Freq: Every day | ORAL | Status: DC
  Administered 2019-12-14 – 2019-12-15 (×2): 80 mg via ORAL
  Filled 2019-12-14: qty 4
  Filled 2019-12-14: qty 1

## 2019-12-14 MED ORDER — METFORMIN HCL 500 MG PO TABS
500.0000 mg | ORAL_TABLET | Freq: Two times a day (BID) | ORAL | Status: DC
  Filled 2019-12-14: qty 1

## 2019-12-14 MED ORDER — BUPROPION HCL ER (XL) 150 MG PO TB24
300.0000 mg | ORAL_TABLET | Freq: Every day | ORAL | Status: DC
  Administered 2019-12-14 – 2019-12-15 (×2): 300 mg via ORAL
  Filled 2019-12-14 (×2): qty 2

## 2019-12-14 MED ORDER — AMIODARONE HCL 200 MG PO TABS: 200.0000 mg | ORAL_TABLET | Freq: Every day | ORAL | Status: DC

## 2019-12-14 MED ORDER — PERFLUTREN LIPID MICROSPHERE
1.0000 mL | INTRAVENOUS | Status: AC | PRN
  Administered 2019-12-14: 2 mL via INTRAVENOUS
  Filled 2019-12-14: qty 10

## 2019-12-14 MED ORDER — SACUBITRIL-VALSARTAN 49-51 MG PO TABS
1.0000 | ORAL_TABLET | Freq: Two times a day (BID) | ORAL | Status: DC
  Administered 2019-12-14 – 2019-12-15 (×3): 1 via ORAL
  Filled 2019-12-14 (×4): qty 1

## 2019-12-14 MED ORDER — CARVEDILOL 25 MG PO TABS
25.0000 mg | ORAL_TABLET | Freq: Two times a day (BID) | ORAL | Status: DC
  Administered 2019-12-14 – 2019-12-15 (×3): 25 mg via ORAL
  Filled 2019-12-14 (×3): qty 1

## 2019-12-14 MED ORDER — AMIODARONE HCL 200 MG PO TABS
400.0000 mg | ORAL_TABLET | Freq: Every day | ORAL | Status: DC
  Administered 2019-12-14: 400 mg via ORAL
  Filled 2019-12-14: qty 2

## 2019-12-14 MED ORDER — APIXABAN 5 MG PO TABS
5.0000 mg | ORAL_TABLET | Freq: Two times a day (BID) | ORAL | Status: DC
  Administered 2019-12-14 – 2019-12-15 (×3): 5 mg via ORAL
  Filled 2019-12-14 (×3): qty 1

## 2019-12-14 MED ORDER — AMIODARONE HCL 200 MG PO TABS: 200.0000 mg | ORAL_TABLET | ORAL | Status: DC

## 2019-12-14 MED ORDER — AMIODARONE HCL IN DEXTROSE 360-4.14 MG/200ML-% IV SOLN: 60.0000 mg/h | INTRAVENOUS | Status: DC

## 2019-12-14 MED ORDER — VITAMIN B-6 50 MG PO TABS
100.0000 mg | ORAL_TABLET | Freq: Every day | ORAL | Status: DC
  Administered 2019-12-15: 100 mg via ORAL
  Filled 2019-12-14: qty 2

## 2019-12-14 MED ORDER — AMIODARONE HCL 200 MG PO TABS
400.0000 mg | ORAL_TABLET | Freq: Two times a day (BID) | ORAL | Status: DC
  Administered 2019-12-14: 400 mg via ORAL
  Filled 2019-12-14 (×2): qty 2

## 2019-12-14 MED ORDER — EZETIMIBE 10 MG PO TABS
10.0000 mg | ORAL_TABLET | Freq: Every day | ORAL | Status: DC
  Administered 2019-12-14 – 2019-12-15 (×2): 10 mg via ORAL
  Filled 2019-12-14 (×2): qty 1

## 2019-12-14 NOTE — ED Notes (Signed)
Pt provided sprite as requested 

## 2019-12-14 NOTE — ED Notes (Addendum)
Pt resting at this time.

## 2019-12-14 NOTE — ED Notes (Signed)
Pt given urinal.

## 2019-12-14 NOTE — Consult Note (Signed)
This is a 74 year old white male who has a history of CABG and coronary artery disease presented with flash pulmonary edema and has atrial fibrillation for which she has been on 800 mg of amiodarone for more than 2 weeks without converting to sinus rhythm.  Is right now rate controlled but went into flash pulmonary edema.  Plan is to do DC cardioversion in a.m. since his heart rate is already between 60-70 IV amiodarone may lower it further thus electrical cardioversion will be done while he is in the hospital.  Orders been placed and will be done tomorrow hopefully.

## 2019-12-14 NOTE — Progress Notes (Signed)
*  PRELIMINARY RESULTS* Echocardiogram 2D Echocardiogram has been performed.  Bing Duffey M Reneshia Zuccaro 12/14/2019, 9:32 AM 

## 2019-12-14 NOTE — Progress Notes (Signed)
Brief Pharmacy Note  Consult for review of drug interactions in patient starting on amiodarone. Patient with the following medications ordered with potential for drug interactions:  Carvedilol - may increase concentrations, resulting in bradycardia or heart block Sertraline - QT prolongation Apixaban - increased risk of bleeding Atorvastatin - increased risk of myopathy or rhabdomyolysis Ondansetron - QT prolongation   Continue to monitor for side effects and further drug interactions.  Laureen Ochs, PharmD

## 2019-12-14 NOTE — ED Notes (Addendum)
PT resting at this time. 

## 2019-12-14 NOTE — Progress Notes (Signed)
PROGRESS NOTE    Devin LankEdward Frank Harley-DavidsonMisjak Jr.  ZOX:[id number] DOB: 07/25/1945 DOA: 12/13/2019 PCP: Rolm GalaGrandis, Heidi, MD   Brief Narrative: Taken from H&P Devin [name] is a 74 y.o. 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  Patient was in a usual state of health until last night when he developed sudden onset of shortness of breath, dyspnea on exertion.  He also reports mild lower extremity edema. Found to be in pulmonary edema, EKG with A. Fib.  Started on Lasix. Patient follow-up with Dr. Welton FlakesKhan.  Subjective: Patient was feeling better when seen during morning rounds.  He was asking about discharge, stating that his breathing is at baseline now. Later discussed with his cardiologist and they would like to keep him for a possible cardioversion.  Assessment & Plan:   Active Problems:   Congestive heart failure (CHF) (HCC)  New onset congestive heart failure.  Echocardiogram done, pending results.  Patient had normal EF on cardiac catheterization done in March 2021. Might be a flash pulmonary edema, chest sounds clear and he appears euvolemic on exam today.  Remained in A. Fib.  BNP elevated at 771 Discussed with cardiology and according to Dr. Welton FlakesKhan his EF appears at 40 to 45%, which is new, they would like to keep him in hospital for a possible cardioversion as A. fib might be triggering this heart failure. -Continue with Lasix IV daily. -Monitor BMP daily. -Daily weight. -Continue Entresto  A. Fib.  Patient remained in A. fib on EKG, rate controlled.  Patient was on amiodarone along with Coreg and Eliquis. -Cardiology switched him to IV amiodarone. -Continue Coreg and Eliquis. -Might need cardioversion tomorrow.  CAD.  Denies any chest pain.  Troponin negative. -Continue home dose of Zetia and Lipitor.  Type 2 diabetes mellitus. -SSI.  Hypertension.  Blood pressure within  goal. -Continue home dose of Entresto. -Patient is also on Lasix.  History of depression.  No acute concern. -Continue home dose of Wellbutrin and Zoloft.  Objective: Vitals:   12/14/19 1315 12/14/19 1330 12/14/19 1400 12/14/19 1500  BP:  112/77 108/67 113/68  Pulse: 82 81 74 66  Resp: (!) 22 17 17 17   Temp:      TempSrc:      SpO2: 97% 93% 96% 93%  Weight:      Height:        Intake/Output Summary (Last 24 hours) at 12/14/2019 1517 Last data filed at 12/14/2019 [id number] Gross per 24 hour  Intake --  Output 1175 ml  Net -1175 ml   Filed Weights   12/13/19 1508  Weight: 94.3 kg    Examination:  General exam: Well-developed gentleman, appears calm and comfortable  Respiratory system: Clear to auscultation. Respiratory effort normal. Cardiovascular system: S1 & S2 heard, RRR. No JVD,  Gastrointestinal system: Soft, nontender, nondistended, bowel sounds positive. Central nervous system: Alert and oriented. No focal neurological deficits. Extremities: No edema, no cyanosis, pulses intact and symmetrical. Psychiatry: Judgement and insight appear normal. Mood & affect appropriate.    DVT prophylaxis: Eliquis Code Status: Full Family Communication: Discussed with patient Disposition Plan:  Status is: Inpatient  Remains inpatient appropriate because:Inpatient level of care appropriate due to severity of illness   Dispo: The patient is from: Home              Anticipated d/c is to: Home  Anticipated d/c date is: 1 day              Patient currently is not medically stable to d/c.   Consultants:   Cardiology.  Procedures:  Antimicrobials:   Data Reviewed: I have personally reviewed following labs and imaging studies  CBC: Recent Labs  Lab 12/13/19 1505  WBC 11.1*  NEUTROABS 8.8*  HGB 14.6  HCT 41.8  MCV 90.3  PLT 260   Basic Metabolic Panel: Recent Labs  Lab 12/13/19 1505  NA 133*  K 4.5  CL 99  CO2 24  GLUCOSE 103*  BUN 11   CREATININE 0.83  CALCIUM 9.0   GFR: Estimated Creatinine Clearance: 91.7 mL/min (by C-G formula based on SCr of 0.83 mg/dL). Liver Function Tests: Recent Labs  Lab 12/13/19 1505  AST 23  ALT 29  ALKPHOS 57  BILITOT 1.2  PROT 7.4  ALBUMIN 4.0   No results for input(s): LIPASE, AMYLASE in the last 168 hours. No results for input(s): AMMONIA in the last 168 hours. Coagulation Profile: No results for input(s): INR, PROTIME in the last 168 hours. Cardiac Enzymes: No results for input(s): CKTOTAL, CKMB, CKMBINDEX, TROPONINI in the last 168 hours. BNP (last 3 results) No results for input(s): PROBNP in the last 8760 hours. HbA1C: No results for input(s): HGBA1C in the last 72 hours. CBG: Recent Labs  Lab 12/13/19 2350 12/14/19 0811 12/14/19 1106  GLUCAP 95 212* 179*   Lipid Profile: No results for input(s): CHOL, HDL, LDLCALC, TRIG, CHOLHDL, LDLDIRECT in the last 72 hours. Thyroid Function Tests: No results for input(s): TSH, T4TOTAL, FREET4, T3FREE, THYROIDAB in the last 72 hours. Anemia Panel: No results for input(s): VITAMINB12, FOLATE, FERRITIN, TIBC, IRON, RETICCTPCT in the last 72 hours. Sepsis Labs: No results for input(s): PROCALCITON, LATICACIDVEN in the last 168 hours.  Recent Results (from the past 240 hour(s))  Respiratory Panel by RT PCR (Flu A&B, Covid) - Nasopharyngeal Swab     Status: None   Collection Time: 12/06/19  1:41 AM   Specimen: Nasopharyngeal Swab  Result Value Ref Range Status   SARS Coronavirus 2 by RT PCR NEGATIVE NEGATIVE Final    Comment: (NOTE) SARS-CoV-2 target nucleic acids are NOT DETECTED.  The SARS-CoV-2 RNA is generally detectable in upper respiratoy specimens during the acute phase of infection. The lowest concentration of SARS-CoV-2 viral copies this assay can detect is 131 copies/mL. A negative result does not preclude SARS-Cov-2 infection and should not be used as the sole basis for treatment or other patient management  decisions. A negative result may occur with  improper specimen collection/handling, submission of specimen other than nasopharyngeal swab, presence of viral mutation(s) within the areas targeted by this assay, and inadequate number of viral copies (<131 copies/mL). A negative result must be combined with clinical observations, patient history, and epidemiological information. The expected result is Negative.  Fact Sheet for Patients:  https://www.moore.com/  Fact Sheet for Healthcare Providers:  https://www.young.biz/  This test is no t yet approved or cleared by the [place] FDA and  has been authorized for detection and/or diagnosis of SARS-CoV-2 by FDA under an Emergency Use Authorization (EUA). This EUA will remain  in effect (meaning this test can be used) for the duration of the COVID-19 declaration under Section 564(b)(1) of the Act, 21 U.S.C. section 360bbb-3(b)(1), unless the authorization is terminated or revoked sooner.     Influenza A by PCR NEGATIVE NEGATIVE Final   Influenza B by PCR NEGATIVE NEGATIVE Final  Comment: (NOTE) The Xpert Xpress SARS-CoV-2/FLU/RSV assay is intended as an aid in  the diagnosis of influenza from Nasopharyngeal swab specimens and  should not be used as a sole basis for treatment. Nasal washings and  aspirates are unacceptable for Xpert Xpress SARS-CoV-2/FLU/RSV  testing.  Fact Sheet for Patients: https://www.moore.com/  Fact Sheet for Healthcare Providers: https://www.young.biz/  This test is not yet approved or cleared by the [place] FDA and  has been authorized for detection and/or diagnosis of SARS-CoV-2 by  FDA under an Emergency Use Authorization (EUA). This EUA will remain  in effect (meaning this test can be used) for the duration of the  Covid-19 declaration under Section 564(b)(1) of the Act, 21  U.S.C. section 360bbb-3(b)(1), unless the  authorization is  terminated or revoked. Performed at [facility], [address] [place], [place] [address]   Respiratory Panel by RT PCR (Flu A&B, Covid) - Nasopharyngeal Swab     Status: None   Collection Time: 12/13/19  3:05 PM   Specimen: Nasopharyngeal Swab  Result Value Ref Range Status   SARS Coronavirus 2 by RT PCR NEGATIVE NEGATIVE Final    Comment: (NOTE) SARS-CoV-2 target nucleic acids are NOT DETECTED.  The SARS-CoV-2 RNA is generally detectable in upper respiratoy specimens during the acute phase of infection. The lowest concentration of SARS-CoV-2 viral copies this assay can detect is 131 copies/mL. A negative result does not preclude SARS-Cov-2 infection and should not be used as the sole basis for treatment or other patient management decisions. A negative result may occur with  improper specimen collection/handling, submission of specimen other than nasopharyngeal swab, presence of viral mutation(s) within the areas targeted by this assay, and inadequate number of viral copies (<131 copies/mL). A negative result must be combined with clinical observations, patient history, and epidemiological information. The expected result is Negative.  Fact Sheet for Patients:  https://www.moore.com/  Fact Sheet for Healthcare Providers:  https://www.young.biz/  This test is no t yet approved or cleared by the [place] FDA and  has been authorized for detection and/or diagnosis of SARS-CoV-2 by FDA under an Emergency Use Authorization (EUA). This EUA will remain  in effect (meaning this test can be used) for the duration of the COVID-19 declaration under Section 564(b)(1) of the Act, 21 U.S.C. section 360bbb-3(b)(1), unless the authorization is terminated or revoked sooner.     Influenza A by PCR NEGATIVE NEGATIVE Final   Influenza B by PCR NEGATIVE NEGATIVE Final    Comment: (NOTE) The Xpert Xpress  SARS-CoV-2/FLU/RSV assay is intended as an aid in  the diagnosis of influenza from Nasopharyngeal swab specimens and  should not be used as a sole basis for treatment. Nasal washings and  aspirates are unacceptable for Xpert Xpress SARS-CoV-2/FLU/RSV  testing.  Fact Sheet for Patients: https://www.moore.com/  Fact Sheet for Healthcare Providers: https://www.young.biz/  This test is not yet approved or cleared by the [place] FDA and  has been authorized for detection and/or diagnosis of SARS-CoV-2 by  FDA under an Emergency Use Authorization (EUA). This EUA will remain  in effect (meaning this test can be used) for the duration of the  Covid-19 declaration under Section 564(b)(1) of the Act, 21  U.S.C. section 360bbb-3(b)(1), unless the authorization is  terminated or revoked. Performed at [facility], [address], [place], [place] [address]      Radiology Studies: DG Chest 2 View  Result Date: 12/13/2019 CLINICAL DATA:  Weakness and shortness of breath on exertion.  Hyponatremia. EXAM: CHEST - 2 VIEW COMPARISON:  Single-view of the chest 05/20/2019. FINDINGS: There are hazy bilateral pulmonary opacities in the mid and lower lung zones with an appearance most suggestive of interstitial edema. The patient is status post CABG. Aortic atherosclerosis. Heart size normal. Neural stimulator device noted. IMPRESSION: Hazy bilateral pulmonary opacities most suggestive of pulmonary edema. Aortic Atherosclerosis (ICD10-I70.0). Electronically Signed   By: Drusilla Kanner M.D.   On: 12/13/2019 16:34   ECHOCARDIOGRAM COMPLETE  Result Date: 12/14/2019    ECHOCARDIOGRAM REPORT   Patient Name:   Devin [name] [facility]. Date of Exam: 12/14/2019 Medical Rec #:  [id number]               Height:       72.0 in Accession #:    [id number]              Weight:       208.0 lb Date of Birth:  [birth year]               BSA:          2.166 m Patient Age:    73  years                BP:           153/91 mmHg Patient Gender: M                       HR:           74 bpm. Exam Location:  [facility] Procedure: 2D Echo, Color Doppler, Cardiac Doppler and Intracardiac            Opacification Agent Indications:     I50.9 Congestive Heart Failure  History:         Patient has no prior history of Echocardiogram examinations.                  Previous Myocardial Infarction and CAD, Prior CABG; Risk                  Factors:Sleep Apnea, Hypertension and Diabetes.  Sonographer:     Humphrey Rolls RDCS (AE) Referring Phys:  [id number] Tonye Royalty Diagnosing Phys: Adrian Blackwater MD  Sonographer Comments: Suboptimal apical window and suboptimal subcostal window. IMPRESSIONS  1. Left ventricular ejection fraction, by estimation, is 40 to 45%. The left ventricle has mildly decreased function. The left ventricle demonstrates global hypokinesis. Left ventricular diastolic parameters are consistent with Grade I diastolic dysfunction (impaired relaxation).  2. Right ventricular systolic function is normal. The right ventricular size is normal.  3. Left atrial size was mildly dilated.  4. Right atrial size was mildly dilated.  5. The mitral valve is normal in structure. Mild mitral valve regurgitation. No evidence of mitral stenosis.  6. The aortic valve is normal in structure. Aortic valve regurgitation is not visualized. No aortic stenosis is present.  7. The inferior vena cava is normal in size with greater than 50% respiratory variability, suggesting right atrial pressure of 3 mmHg. Conclusion(s)/Recommendation(s): Findings consistent with ischemic cardiomyopathy. FINDINGS  Left Ventricle: Left ventricular ejection fraction, by estimation, is 40 to 45%. The left ventricle has mildly decreased function. The left ventricle demonstrates global hypokinesis. Definity contrast agent was given IV to delineate the left ventricular  endocardial borders. The left ventricular internal cavity size was normal in  size. There is borderline left ventricular hypertrophy. Left ventricular diastolic parameters are consistent with Grade I diastolic  dysfunction (impaired relaxation). Right Ventricle: The right ventricular size is normal. No increase in right ventricular wall thickness. Right ventricular systolic function is normal. Left Atrium: Left atrial size was mildly dilated. Right Atrium: Right atrial size was mildly dilated. Pericardium: There is no evidence of pericardial effusion. Mitral Valve: The mitral valve is normal in structure. Mild mitral valve regurgitation. No evidence of mitral valve stenosis. MV peak gradient, 8.0 mmHg. The mean mitral valve gradient is 2.0 mmHg. Tricuspid Valve: The tricuspid valve is normal in structure. Tricuspid valve regurgitation is trivial. No evidence of tricuspid stenosis. Aortic Valve: The aortic valve is normal in structure. Aortic valve regurgitation is not visualized. No aortic stenosis is present. Aortic valve mean gradient measures 5.0 mmHg. Aortic valve peak gradient measures 9.6 mmHg. Aortic valve area, by VTI measures 2.58 cm. Pulmonic Valve: The pulmonic valve was normal in structure. Pulmonic valve regurgitation is trivial. No evidence of pulmonic stenosis. Aorta: The aortic root is normal in size and structure. Venous: The inferior vena cava is normal in size with greater than 50% respiratory variability, suggesting right atrial pressure of 3 mmHg. IAS/Shunts: No atrial level shunt detected by color flow Doppler.  LEFT VENTRICLE PLAX 2D LVIDd:         4.21 cm      Diastology LVIDs:         3.65 cm      LV e' medial:    8.92 cm/s LV PW:         1.31 cm      LV E/e' medial:  14.9 LV IVS:        0.86 cm      LV e' lateral:   15.80 cm/s LVOT diam:     2.20 cm      LV E/e' lateral: 8.4 LV SV:         77 LV SV Index:   36 LVOT Area:     3.80 cm  LV Volumes (MOD) LV vol d, MOD A2C: 122.0 ml LV vol d, MOD A4C: 128.0 ml LV vol s, MOD A2C: 64.9 ml LV vol s, MOD A4C: 56.7 ml LV SV  MOD A2C:     57.1 ml LV SV MOD A4C:     128.0 ml LV SV MOD BP:      64.8 ml RIGHT VENTRICLE RV Basal diam:  3.71 cm LEFT ATRIUM             Index       RIGHT ATRIUM           Index LA diam:        4.50 cm 2.08 cm/m  RA Area:     16.20 cm LA Vol (A2C):   55.4 ml 25.57 ml/m RA Volume:   47.30 ml  21.83 ml/m LA Vol (A4C):   44.0 ml 20.31 ml/m LA Biplane Vol: 51.0 ml 23.54 ml/m  AORTIC VALVE                    PULMONIC VALVE AV Area (Vmax):    2.34 cm     PV Vmax:       0.98 m/s AV Area (Vmean):   2.34 cm     PV Vmean:      69.900 cm/s AV Area (VTI):     2.58 cm     PV VTI:        0.190 m AV Vmax:           155.00  cm/s  PV Peak grad:  3.8 mmHg AV Vmean:          102.000 cm/s PV Mean grad:  2.0 mmHg AV VTI:            0.299 m AV Peak Grad:      9.6 mmHg AV Mean Grad:      5.0 mmHg LVOT Vmax:         95.30 cm/s LVOT Vmean:        62.800 cm/s LVOT VTI:          0.203 m LVOT/AV VTI ratio: 0.68  AORTA Ao Root diam: 3.60 cm MITRAL VALVE                TRICUSPID VALVE MV Area (PHT): 4.93 cm     TR Peak grad:   49.0 mmHg MV Peak grad:  8.0 mmHg     TR Vmax:        350.00 cm/s MV Mean grad:  2.0 mmHg MV Vmax:       1.41 m/s     SHUNTS MV Vmean:      59.8 cm/s    Systemic VTI:  0.20 m MV Decel Time: 154 msec     Systemic Diam: 2.20 cm MV E velocity: 133.00 cm/s MV A velocity: 39.00 cm/s MV E/A ratio:  3.41 Adrian Blackwater MD Electronically signed by Adrian Blackwater MD Signature Date/Time: 12/14/2019/2:42:10 PM    Final     Scheduled Meds: . amiodarone  400 mg Oral BID  . apixaban  5 mg Oral BID  . atorvastatin  80 mg Oral Daily  . buPROPion  300 mg Oral Daily  . carvedilol  25 mg Oral BID  . ezetimibe  10 mg Oral Daily  . furosemide  40 mg Intravenous Daily  . gabapentin  300 mg Oral BID  . insulin aspart  0-15 Units Subcutaneous TID WC  . insulin aspart  0-5 Units Subcutaneous QHS  . melatonin  2.5 mg Oral QHS  . metFORMIN  500 mg Oral BID  . [START ON 12/15/2019] pyridOXINE  100 mg Oral Daily  .  sacubitril-valsartan  1 tablet Oral BID  . sertraline  25 mg Oral Daily  . sodium chloride flush  3 mL Intravenous Q12H   Continuous Infusions: . sodium chloride       LOS: 1 day   Time spent: 40 minutes.  Arnetha Courser, MD [facility]  If 7PM-7AM, please contact night-coverage [email or web address]  12/14/2019, 3:17 PM   This record has been created using Conservation officer, historic buildings. Errors have been sought and corrected,but may not always be located. Such creation errors do not reflect on the standard of care.

## 2019-12-14 NOTE — Consult Note (Signed)
Kayceon Oki. is a 74 y.o. male  [id number]  Primary Cardiologist: Adrian Blackwater Reason for Consultation: Flash pulmonary edema with underlying A. 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 amiodarone 400 twice daily.  Patient presented with shortness of breath and was found to be in pulmonary edema.   Review of Systems: No chest pain   Past Medical History:  Diagnosis Date  . CAD (coronary artery disease)   . Diabetes mellitus without complication (HCC)   . Hypertension   . MI, old   . Sleep apnea     (Not in a hospital admission)    . amiodarone  400 mg Oral BID  . apixaban  5 mg Oral BID  . atorvastatin  80 mg Oral Daily  . buPROPion  300 mg Oral Daily  . carvedilol  25 mg Oral BID  . ezetimibe  10 mg Oral Daily  . furosemide  40 mg Intravenous Daily  . gabapentin  300 mg Oral BID  . insulin aspart  0-15 Units Subcutaneous TID WC  . insulin aspart  0-5 Units Subcutaneous QHS  . melatonin  2.5 mg Oral QHS  . metFORMIN  500 mg Oral BID  . [START ON 12/15/2019] pyridOXINE  100 mg Oral Daily  . sacubitril-valsartan  1 tablet Oral BID  . sertraline  25 mg Oral Daily  . sodium chloride flush  3 mL Intravenous Q12H    Infusions: . sodium chloride      Allergies  Allergen Reactions  . Ace Inhibitors Swelling  . Morphine Swelling  . 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    Social History   Socioeconomic History  . Marital status: Widowed    Spouse name: Not on file  . Number of children: Not on file  . Years of education: Not on file  . Highest education level: Not on file  Occupational History  . Not on file  Tobacco Use  . Smoking status: Former Games developer  . Smokeless tobacco: Never Used  . Tobacco comment: Quit over 40 years ago  Vaping Use  . Vaping Use: Never used  Substance and Sexual Activity  . Alcohol use: Not  Currently    Comment: social  . Drug use: Never  . Sexual activity: Not on file  Other Topics Concern  . Not on file  Social History Narrative  . Not on file   Social Determinants of Health   Financial Resource Strain:   . Difficulty of Paying Living Expenses: Not on file  Food Insecurity:   . Worried About Programme researcher, broadcasting/film/video in the Last Year: Not on file  . Ran Out of Food in the Last Year: Not on file  Transportation Needs:   . Lack of Transportation (Medical): Not on file  . Lack of Transportation (Non-Medical): Not on file  Physical Activity:   . Days of Exercise per Week: Not on file  . Minutes of Exercise per Session: Not on file  Stress:   . Feeling of Stress : Not on file  Social Connections:   . Frequency of Communication with Friends and Family: Not on file  . Frequency of Social Gatherings with Friends and Family: Not on file  . Attends Religious Services: Not on file  . Active Member of Clubs or Organizations: Not on file  . Attends Banker Meetings: Not  on file  . Marital Status: Not on file  Intimate Partner Violence:   . Fear of Current or Ex-Partner: Not on file  . Emotionally Abused: Not on file  . Physically Abused: Not on file  . Sexually Abused: Not on file    Family History  Problem Relation Age of Onset  . Osteoarthritis Mother   . Other Father 90       MVA    PHYSICAL EXAM: Vitals:   12/14/19 1400 12/14/19 1500  BP: 108/67 113/68  Pulse: 74 66  Resp: 17 17  Temp:    SpO2: 96% 93%     Intake/Output Summary (Last 24 hours) at 12/14/2019 1524 Last data filed at 12/14/2019 0508 Gross per 24 hour  Intake --  Output 1175 ml  Net -1175 ml    General:  Well appearing. No respiratory difficulty HEENT: normal Neck: supple. no JVD. Carotids 2+ bilat; no bruits. No lymphadenopathy or thryomegaly appreciated. Cor: PMI nondisplaced. Regular rate & rhythm. No rubs, gallops or murmurs. Lungs: clear Abdomen: soft, nontender,  nondistended. No hepatosplenomegaly. No bruits or masses. Good bowel sounds. Extremities: no cyanosis, clubbing, rash, edema Neuro: alert & oriented x 3, cranial nerves grossly intact. moves all 4 extremities w/o difficulty. Affect pleasant.  ECG: A. fib with ventricular rate about 70  Results for orders placed or performed during the hospital encounter of 12/13/19 (from the past 24 hour(s))  Glucose, capillary     Status: None   Collection Time: 12/13/19 11:50 PM  Result Value Ref Range   Glucose-Capillary 95 70 - 99 mg/dL  Troponin I (High Sensitivity)     Status: None   Collection Time: 12/13/19 11:52 PM  Result Value Ref Range   Troponin I (High Sensitivity) 4 <18 ng/L  Troponin I (High Sensitivity)     Status: None   Collection Time: 12/14/19  5:19 AM  Result Value Ref Range   Troponin I (High Sensitivity) 5 <18 ng/L  Glucose, capillary     Status: Abnormal   Collection Time: 12/14/19  8:11 AM  Result Value Ref Range   Glucose-Capillary 212 (H) 70 - 99 mg/dL  Glucose, capillary     Status: Abnormal   Collection Time: 12/14/19 11:06 AM  Result Value Ref Range   Glucose-Capillary 179 (H) 70 - 99 mg/dL   DG Chest 2 View  Result Date: 12/13/2019 CLINICAL DATA:  Weakness and shortness of breath on exertion. Hyponatremia. EXAM: CHEST - 2 VIEW COMPARISON:  Single-view of the chest 05/20/2019. FINDINGS: There are hazy bilateral pulmonary opacities in the mid and lower lung zones with an appearance most suggestive of interstitial edema. The patient is status post CABG. Aortic atherosclerosis. Heart size normal. Neural stimulator device noted. IMPRESSION: Hazy bilateral pulmonary opacities most suggestive of pulmonary edema. Aortic Atherosclerosis (ICD10-I70.0). Electronically Signed   By: Drusilla Kanner M.D.   On: 12/13/2019 16:34   ECHOCARDIOGRAM COMPLETE  Result Date: 12/14/2019    ECHOCARDIOGRAM REPORT   Patient Name:   Jaegar Croft [facility]. Date of Exam: 12/14/2019 Medical Rec #:   [id number]               Height:       72.0 in Accession #:    [id number]              Weight:       208.0 lb Date of Birth:  20-Jun-1945               BSA:  2.166 m Patient Age:    73 years                BP:           153/91 mmHg Patient Gender: M                       HR:           74 bpm. Exam Location:  [facility] Procedure: 2D Echo, Color Doppler, Cardiac Doppler and Intracardiac            Opacification Agent Indications:     I50.9 Congestive Heart Failure  History:         Patient has no prior history of Echocardiogram examinations.                  Previous Myocardial Infarction and CAD, Prior CABG; Risk                  Factors:Sleep Apnea, Hypertension and Diabetes.  Sonographer:     Humphrey Rolls RDCS (AE) Referring Phys:  [id number] Tonye Royalty Diagnosing Phys: Adrian Blackwater MD  Sonographer Comments: Suboptimal apical window and suboptimal subcostal window. IMPRESSIONS  1. Left ventricular ejection fraction, by estimation, is 40 to 45%. The left ventricle has mildly decreased function. The left ventricle demonstrates global hypokinesis. Left ventricular diastolic parameters are consistent with Grade I diastolic dysfunction (impaired relaxation).  2. Right ventricular systolic function is normal. The right ventricular size is normal.  3. Left atrial size was mildly dilated.  4. Right atrial size was mildly dilated.  5. The mitral valve is normal in structure. Mild mitral valve regurgitation. No evidence of mitral stenosis.  6. The aortic valve is normal in structure. Aortic valve regurgitation is not visualized. No aortic stenosis is present.  7. The inferior vena cava is normal in size with greater than 50% respiratory variability, suggesting right atrial pressure of 3 mmHg. Conclusion(s)/Recommendation(s): Findings consistent with ischemic cardiomyopathy. FINDINGS  Left Ventricle: Left ventricular ejection fraction, by estimation, is 40 to 45%. The left ventricle has mildly decreased function. The left  ventricle demonstrates global hypokinesis. Definity contrast agent was given IV to delineate the left ventricular  endocardial borders. The left ventricular internal cavity size was normal in size. There is borderline left ventricular hypertrophy. Left ventricular diastolic parameters are consistent with Grade I diastolic dysfunction (impaired relaxation). Right Ventricle: The right ventricular size is normal. No increase in right ventricular wall thickness. Right ventricular systolic function is normal. Left Atrium: Left atrial size was mildly dilated. Right Atrium: Right atrial size was mildly dilated. Pericardium: There is no evidence of pericardial effusion. Mitral Valve: The mitral valve is normal in structure. Mild mitral valve regurgitation. No evidence of mitral valve stenosis. MV peak gradient, 8.0 mmHg. The mean mitral valve gradient is 2.0 mmHg. Tricuspid Valve: The tricuspid valve is normal in structure. Tricuspid valve regurgitation is trivial. No evidence of tricuspid stenosis. Aortic Valve: The aortic valve is normal in structure. Aortic valve regurgitation is not visualized. No aortic stenosis is present. Aortic valve mean gradient measures 5.0 mmHg. Aortic valve peak gradient measures 9.6 mmHg. Aortic valve area, by VTI measures 2.58 cm. Pulmonic Valve: The pulmonic valve was normal in structure. Pulmonic valve regurgitation is trivial. No evidence of pulmonic stenosis. Aorta: The aortic root is normal in size and structure. Venous: The inferior vena cava is normal in size with greater than 50% respiratory variability, suggesting right atrial pressure of  3 mmHg. IAS/Shunts: No atrial level shunt detected by color flow Doppler.  LEFT VENTRICLE PLAX 2D LVIDd:         4.21 cm      Diastology LVIDs:         3.65 cm      LV e' medial:    8.92 cm/s LV PW:         1.31 cm      LV E/e' medial:  14.9 LV IVS:        0.86 cm      LV e' lateral:   15.80 cm/s LVOT diam:     2.20 cm      LV E/e' lateral: 8.4 LV  SV:         77 LV SV Index:   36 LVOT Area:     3.80 cm  LV Volumes (MOD) LV vol d, MOD A2C: 122.0 ml LV vol d, MOD A4C: 128.0 ml LV vol s, MOD A2C: 64.9 ml LV vol s, MOD A4C: 56.7 ml LV SV MOD A2C:     57.1 ml LV SV MOD A4C:     128.0 ml LV SV MOD BP:      64.8 ml RIGHT VENTRICLE RV Basal diam:  3.71 cm LEFT ATRIUM             Index       RIGHT ATRIUM           Index LA diam:        4.50 cm 2.08 cm/m  RA Area:     16.20 cm LA Vol (A2C):   55.4 ml 25.57 ml/m RA Volume:   47.30 ml  21.83 ml/m LA Vol (A4C):   44.0 ml 20.31 ml/m LA Biplane Vol: 51.0 ml 23.54 ml/m  AORTIC VALVE                    PULMONIC VALVE AV Area (Vmax):    2.34 cm     PV Vmax:       0.98 m/s AV Area (Vmean):   2.34 cm     PV Vmean:      69.900 cm/s AV Area (VTI):     2.58 cm     PV VTI:        0.190 m AV Vmax:           155.00 cm/s  PV Peak grad:  3.8 mmHg AV Vmean:          102.000 cm/s PV Mean grad:  2.0 mmHg AV VTI:            0.299 m AV Peak Grad:      9.6 mmHg AV Mean Grad:      5.0 mmHg LVOT Vmax:         95.30 cm/s LVOT Vmean:        62.800 cm/s LVOT VTI:          0.203 m LVOT/AV VTI ratio: 0.68  AORTA Ao Root diam: 3.60 cm MITRAL VALVE                TRICUSPID VALVE MV Area (PHT): 4.93 cm     TR Peak grad:   49.0 mmHg MV Peak grad:  8.0 mmHg     TR Vmax:        350.00 cm/s MV Mean grad:  2.0 mmHg MV Vmax:       1.41 m/s     SHUNTS MV Vmean:  59.8 cm/s    Systemic VTI:  0.20 m MV Decel Time: 154 msec     Systemic Diam: 2.20 cm MV E velocity: 133.00 cm/s MV A velocity: 39.00 cm/s MV E/A ratio:  3.41 Adrian Blackwater MD Electronically signed by Adrian Blackwater MD Signature Date/Time: 12/14/2019/2:42:10 PM    Final      ASSESSMENT AND PLAN: Flash pulmonary edema with ejection fraction around 45% and A. fib after CABG not responding to medical therapy with controlled ventricular rate and been on anticoagulation for more than 4 weeks thus will go ahead and do electrical cardioversion in the morning patient was explained risk and  benefits and patient has agreed to the procedure.  Orders were placed and patient will be done tomorrow morning.  Abbagail Scaff A

## 2019-12-14 NOTE — ED Notes (Signed)
Pt given sandwich tray, sprite, and coffee at this time upon request

## 2019-12-14 NOTE — ED Notes (Signed)
Pt given phone to talk to Dr. Welton Flakes

## 2019-12-15 ENCOUNTER — Other Ambulatory Visit: Payer: Self-pay

## 2019-12-15 ENCOUNTER — Inpatient Hospital Stay: Payer: Medicare HMO | Admitting: Anesthesiology

## 2019-12-15 ENCOUNTER — Encounter
Admission: EM | Disposition: A | Discharge status: Home / Self Care | Payer: Self-pay | Source: Home / Self Care | Attending: Family Medicine

## 2019-12-15 ENCOUNTER — Encounter: Payer: Self-pay | Admitting: Family Medicine

## 2019-12-15 HISTORY — PX: CARDIOVERSION: SHX1299

## 2019-12-15 LAB — PROTIME-INR
INR: 1.2 (ref 0.8–1.2)
Prothrombin Time: 15.1 seconds (ref 11.4–15.2)

## 2019-12-15 LAB — BASIC METABOLIC PANEL
Anion gap: 10 (ref 5–15)
BUN: 10 mg/dL (ref 8–23)
CO2: 25 mmol/L (ref 22–32)
Calcium: 8.9 mg/dL (ref 8.9–10.3)
Chloride: 103 mmol/L (ref 98–111)
Creatinine, Ser: 0.82 mg/dL (ref 0.61–1.24)
GFR calc Af Amer: >60 mL/min (ref >60)
GFR calc non Af Amer: >60 mL/min (ref >60)
Glucose, Bld: 101 mg/dL — ABNORMAL HIGH (ref 70–99)
Potassium: 3.6 mmol/L (ref 3.5–5.1)
Sodium: 138 mmol/L (ref 135–145)

## 2019-12-15 LAB — CBC
HCT: 38.3 % — ABNORMAL LOW (ref 39.0–52.0)
Hemoglobin: 13.1 g/dL (ref 13.0–17.0)
MCH: 31.3 pg (ref 26.0–34.0)
MCHC: 34.2 g/dL (ref 30.0–36.0)
MCV: 91.4 fL (ref 80.0–100.0)
Platelets: 252 10*3/uL (ref 150–400)
RBC: 4.19 MIL/uL — ABNORMAL LOW (ref 4.22–5.81)
RDW: 15.3 % (ref 11.5–15.5)
WBC: 8.6 10*3/uL (ref 4.0–10.5)
nRBC: 0 % (ref 0.0–0.2)

## 2019-12-15 LAB — GLUCOSE, CAPILLARY: Glucose-Capillary: 94 mg/dL (ref 70–99)

## 2019-12-15 SURGERY — CARDIOVERSION
Anesthesia: General | Laterality: Right

## 2019-12-15 MED ORDER — AMIODARONE HCL 400 MG PO TABS
400.0000 mg | ORAL_TABLET | Freq: Every day | ORAL | 0 refills | Status: DC
Start: 2019-12-16 — End: 2020-06-13

## 2019-12-15 MED ORDER — PROPOFOL 10 MG/ML IV BOLUS
INTRAVENOUS | Status: AC
  Filled 2019-12-15: qty 20

## 2019-12-15 MED ORDER — FUROSEMIDE 20 MG PO TABS: 20.0000 mg | ORAL_TABLET | Freq: Every day | ORAL | 11 refills | Status: DC

## 2019-12-15 MED ORDER — AMIODARONE HCL 200 MG PO TABS
400.0000 mg | ORAL_TABLET | Freq: Every day | ORAL | Status: DC
  Administered 2019-12-15: 400 mg via ORAL
  Filled 2019-12-15 (×2): qty 2

## 2019-12-15 MED ORDER — PROPOFOL 10 MG/ML IV BOLUS
INTRAVENOUS | Status: DC | PRN
  Administered 2019-12-15: 60 mg via INTRAVENOUS

## 2019-12-15 NOTE — Anesthesia Postprocedure Evaluation (Signed)
Anesthesia Post Note  Patient: Devin [name].  Procedure(s) Performed: CARDIOVERSION (Right )  Patient location during evaluation: Cath Lab Anesthesia Type: General Level of consciousness: awake and alert Pain management: pain level controlled Vital Signs Assessment: post-procedure vital signs reviewed and stable Respiratory status: spontaneous breathing, nonlabored ventilation, respiratory function stable and patient connected to nasal cannula oxygen Cardiovascular status: blood pressure returned to baseline and stable Postop Assessment: no apparent nausea or vomiting Anesthetic complications: no   No complications documented.   Last Vitals:  Vitals:   12/15/19 0810 12/15/19 0824  BP: (!) 138/59 132/68  Pulse: 65 66  Resp:  18  Temp:  36.9 C  SpO2: 97% 96%    Last Pain:  Vitals:   12/15/19 0824  TempSrc: Oral  PainSc:                  Cleda Mccreedy Lakyra Tippins

## 2019-12-15 NOTE — Progress Notes (Signed)
Pt returned from cardioversion. VSS, no c/o pain.

## 2019-12-15 NOTE — CV Procedure (Signed)
  NAME:  Magnus Crescenzo [employer].   MRN: [id number] DOB:  May 22, 1945   ADMIT DATE: 12/13/2019  Procedure: Electrical Cardioversion Indications:  Atrial Fibrillation  Procedure Details:    Time Out: Verified patient identification, verified procedure, site/side was marked, verified correct patient position, special equipment/implants available, medications/allergies/relevent history reviewed, required imaging and test results available.    Patient placed on cardiac monitor, pulse oximetry, supplemental oxygen as necessary.  Sedation given:  Pacer pads placed   Cardioverted .  Converted to sinus rhythm Cardioverted at at synchronized 150 J  Evaluation: Findings: Post procedure EKG shows: Sinus rhythm 65 bpm Complications: None Patient did well.     Laurier Nancy, M.D. [facility]   12/15/2019 7:54 AM

## 2019-12-15 NOTE — Transfer of Care (Signed)
Immediate Anesthesia Transfer of Care Note  Patient: Devin [name].  Procedure(s) Performed: CARDIOVERSION (Right )  Patient Location: Short Stay  Anesthesia Type:General  Level of Consciousness: awake, alert  and oriented  Airway & Oxygen Therapy: Patient connected to nasal cannula oxygen  Post-op Assessment: Post -op Vital signs reviewed and stable  Post vital signs: stable  Last Vitals:  Vitals Value Taken Time  BP 137/79 12/15/19 0745  Temp    Pulse 66 12/15/19 0745  Resp 16 12/15/19 0745  SpO2 97 % 12/15/19 0745    Last Pain:  Vitals:   12/15/19 0720  TempSrc: Oral  PainSc:          Complications: No complications documented.

## 2019-12-15 NOTE — Anesthesia Preprocedure Evaluation (Addendum)
Anesthesia Evaluation  Patient identified by MRN, date of birth, ID band Patient awake    Reviewed: Allergy & Precautions, H&P , NPO status , Patient's Chart, lab work & pertinent test results  History of Anesthesia Complications Negative for: history of anesthetic complications  Airway Mallampati: III  TM Distance: <3 FB Neck ROM: limited    Dental  (+) Chipped, Poor Dentition, Missing   Pulmonary neg shortness of breath, sleep apnea , pneumonia, former smoker,     + decreased breath sounds      Cardiovascular hypertension, + angina + CAD, + Past MI, + CABG and +CHF  + dysrhythmias Atrial Fibrillation  Rhythm:irregular     Neuro/Psych PSYCHIATRIC DISORDERS negative neurological ROS     GI/Hepatic Neg liver ROS, GERD  Medicated and Controlled,  Endo/Other  diabetes, Type 2  Renal/GU negative Renal ROS  negative genitourinary   Musculoskeletal   Abdominal   Peds  Hematology negative hematology ROS (+)   Anesthesia Other Findings Past Medical History: No date: CAD (coronary artery disease) No date: Diabetes mellitus without complication (HCC) No date: Hypertension No date: MI, old No date: Sleep apnea  Past Surgical History: No date: APPENDECTOMY No date: BREAST SURGERY     Comment:  benign mass No date: CORONARY ANGIOPLASTY WITH STENT PLACEMENT 05/21/2019: LEFT HEART CATH AND CORS/GRAFTS ANGIOGRAPHY; N/A     Comment:  Procedure: LEFT HEART CATH AND CORONARY ANGIOGRAPHY;                Surgeon: Laurier Nancy, MD;  Location: [facility] INVASIVE CV              LAB;  Service: Cardiovascular;  Laterality: N/A; No date: REPLACEMENT TOTAL KNEE BILATERAL  BMI    Body Mass Index: 28.10 kg/m      Reproductive/Obstetrics negative OB ROS                            Anesthesia Physical Anesthesia Plan  ASA: IV  Anesthesia Plan: General   Post-op Pain Management:    Induction:  Intravenous  PONV Risk Score and Plan: Propofol infusion and TIVA  Airway Management Planned: Natural Airway and Nasal Cannula  Additional Equipment:   Intra-op Plan:   Post-operative Plan:   Informed Consent: I have reviewed the patients History and Physical, chart, labs and discussed the procedure including the risks, benefits and alternatives for the proposed anesthesia with the patient or authorized representative who has indicated his/her understanding and acceptance.     Dental Advisory Given  Plan Discussed with: Anesthesiologist, CRNA and Surgeon  Anesthesia Plan Comments: (Patient consented for risks of anesthesia including but not limited to:  - adverse reactions to medications - risk of intubation if required - damage to eyes, teeth, lips or other oral mucosa - nerve damage due to positioning  - sore throat or hoarseness - Damage to heart, brain, nerves, lungs, other parts of body or loss of life  Patient voiced understanding.)        Anesthesia Quick Evaluation

## 2019-12-15 NOTE — Discharge Summary (Signed)
Physician Discharge Summary  Devin Lank Sutch Jr. ZOX:[id number] DOB: 1945-10-27 DOA: 12/13/2019  PCP: [name], MD  Admit date: 12/13/2019 Discharge date: 12/15/2019  Admitted From: Home Disposition: Home  Recommendations for Outpatient Follow-up:  1. Follow up with PCP in 1-2 weeks 2. Follow-up with cardiology 3. Please obtain BMP/CBC in one week 4. 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 Patient was in a usual state of health until last night when he developed sudden onset of shortness of breath, dyspnea on exertion.He also reports mild lower extremity edema. Found to be in pulmonary edema, EKG with A. Fib. Echocardiogram with new onset of low EF at 40 to 45%, prior EF was normal in March 2021 during cardiac catheterization.  Thought to be secondary to A. fib.  Patient was on amiodarone by his cardiologist. His cardiologist was again consulted and they did cardioversion this morning which was successful and patient was in sinus rhythm on discharge. He will continue with 400 mg of amiodarone as advised by cardiology and follow-up with them according to his schedule appointment. He was also started on low-dose Lasix on discharge and cardiology will do further management.  Patient will continue with rest of his home meds and follow-up with his providers.  Discharge Diagnoses:  Active Problems:   Congestive heart failure (CHF) [facility])   Discharge Instructions  Discharge Instructions    Diet - low sodium heart healthy   Complete by: As directed    Discharge instructions   Complete by: As directed    It was pleasure  taking care of you. Please continue taking amiodarone 400 mg daily and follow-up with your cardiologist tomorrow for further recommendations. I am starting you on a low-dose Lasix at 20 mg daily, your cardiologist can manage if you need more or discontinue after some time.   Increase activity slowly   Complete by: As directed      Allergies as of 12/15/2019      Reactions   Ace Inhibitors Swelling   Morphine Swelling   Yellow Jacket Venom [bee Venom] Anaphylaxis      Medication List    TAKE these medications   amiodarone 400 MG tablet Commonly known as: PACERONE Take 1 tablet (400 mg total) by mouth daily. Start taking on: December 16, 2019 What changed:   medication strength  how much to take  when to take this  additional instructions   apixaban 5 MG Tabs tablet Commonly known as: ELIQUIS Take 5 mg by mouth 2 (two) times daily.   atorvastatin 80 MG tablet Commonly known as: Lipitor Take 1 tablet (80 mg total) by mouth daily.   buPROPion 300 MG 24 hr tablet Commonly known as: WELLBUTRIN XL Take 300 mg by mouth daily.   carvedilol 25 MG tablet Commonly known as: Coreg Take 1 tablet (25 mg total) by mouth 2 (two) times daily.   Entresto 49-51 MG Generic drug: sacubitril-valsartan Take 1 tablet by mouth 2 (two) times daily.   ezetimibe 10 MG tablet Commonly known as: ZETIA Take 10 mg by mouth daily.   furosemide 20 MG tablet Commonly known as: Lasix Take 1 tablet (20 mg total) by mouth daily.   gabapentin 300  MG capsule Commonly known as: NEURONTIN Take 300 mg by mouth 2 (two) times daily.   metFORMIN 500 MG tablet Commonly known as: GLUCOPHAGE Take 500 mg by mouth 2 (two) times daily.   pyridOXINE 100 MG tablet Commonly known as: VITAMIN B-6 Take 100 mg by mouth daily.   sertraline 25 MG tablet Commonly known as: ZOLOFT Take 25 mg by mouth daily.       Follow-up Information    [facility] Follow up on  12/24/2019.   Specialty: Cardiology Why: at 9:30am. Enter through the Medical Mall entrance Contact information: [address] [place] [address] [phone]       [name], MD. Schedule an appointment as soon as possible for a visit.   Specialty: Family Medicine Contact information: [address] [place] [address] [phone]              Allergies  Allergen Reactions  . Ace Inhibitors Swelling  . Morphine Swelling  . 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    Consultations:  Cardiology  Procedures/Studies: DG Chest 2 View  Result Date: 12/13/2019 CLINICAL DATA:  Weakness and shortness of breath on exertion. Hyponatremia. EXAM: CHEST - 2 VIEW COMPARISON:  Single-view of the chest 05/20/2019. FINDINGS: There are hazy bilateral pulmonary opacities in the mid and lower lung zones with an appearance most suggestive of interstitial edema. The patient is status post CABG. Aortic atherosclerosis. Heart size normal. Neural stimulator device noted. IMPRESSION: Hazy bilateral pulmonary opacities most suggestive of pulmonary edema. Aortic Atherosclerosis (ICD10-I70.0). Electronically Signed   By: Drusilla Kanner M.D.   On: 12/13/2019 16:34   ECHOCARDIOGRAM COMPLETE  Result Date: 12/14/2019    ECHOCARDIOGRAM REPORT   Patient Name:   Devin [name] [facility]. Date of Exam: 12/14/2019 Medical Rec #:  [id number]               Height:       72.0 in Accession #:    [id number]              Weight:       208.0 lb Date of Birth:  [birth year]               BSA:          2.166 m Patient Age:    73 years                BP:           153/91 mmHg Patient Gender: M                       HR:           74 bpm. Exam Location:  [facility] Procedure: 2D Echo, Color Doppler, Cardiac Doppler and Intracardiac            Opacification Agent Indications:     I50.9 Congestive Heart Failure  History:         Patient has no prior history of Echocardiogram examinations.                   Previous Myocardial Infarction and CAD, Prior CABG; Risk                  Factors:Sleep Apnea, Hypertension and Diabetes.  Sonographer:     Humphrey Rolls RDCS (AE) Referring Phys:  [id number] Tonye Royalty Diagnosing Phys: Adrian Blackwater MD  Sonographer Comments: Suboptimal  apical window and suboptimal subcostal window. IMPRESSIONS  1. Left ventricular ejection fraction, by estimation, is 40 to 45%. The left ventricle has mildly decreased function. The left ventricle demonstrates global hypokinesis. Left ventricular diastolic parameters are consistent with Grade I diastolic dysfunction (impaired relaxation).  2. Right ventricular systolic function is normal. The right ventricular size is normal.  3. Left atrial size was mildly dilated.  4. Right atrial size was mildly dilated.  5. The mitral valve is normal in structure. Mild mitral valve regurgitation. No evidence of mitral stenosis.  6. The aortic valve is normal in structure. Aortic valve regurgitation is not visualized. No aortic stenosis is present.  7. The inferior vena cava is normal in size with greater than 50% respiratory variability, suggesting right atrial pressure of 3 mmHg. Conclusion(s)/Recommendation(s): Findings consistent with ischemic cardiomyopathy. FINDINGS  Left Ventricle: Left ventricular ejection fraction, by estimation, is 40 to 45%. The left ventricle has mildly decreased function. The left ventricle demonstrates global hypokinesis. Definity contrast agent was given IV to delineate the left ventricular  endocardial borders. The left ventricular internal cavity size was normal in size. There is borderline left ventricular hypertrophy. Left ventricular diastolic parameters are consistent with Grade I diastolic dysfunction (impaired relaxation). Right Ventricle: The right ventricular size is normal. No increase in right ventricular wall thickness. Right ventricular systolic function is normal. Left Atrium: Left atrial size was mildly  dilated. Right Atrium: Right atrial size was mildly dilated. Pericardium: There is no evidence of pericardial effusion. Mitral Valve: The mitral valve is normal in structure. Mild mitral valve regurgitation. No evidence of mitral valve stenosis. MV peak gradient, 8.0 mmHg. The mean mitral valve gradient is 2.0 mmHg. Tricuspid Valve: The tricuspid valve is normal in structure. Tricuspid valve regurgitation is trivial. No evidence of tricuspid stenosis. Aortic Valve: The aortic valve is normal in structure. Aortic valve regurgitation is not visualized. No aortic stenosis is present. Aortic valve mean gradient measures 5.0 mmHg. Aortic valve peak gradient measures 9.6 mmHg. Aortic valve area, by VTI measures 2.58 cm. Pulmonic Valve: The pulmonic valve was normal in structure. Pulmonic valve regurgitation is trivial. No evidence of pulmonic stenosis. Aorta: The aortic root is normal in size and structure. Venous: The inferior vena cava is normal in size with greater than 50% respiratory variability, suggesting right atrial pressure of 3 mmHg. IAS/Shunts: No atrial level shunt detected by color flow Doppler.  LEFT VENTRICLE PLAX 2D LVIDd:         4.21 cm      Diastology LVIDs:         3.65 cm      LV e' medial:    8.92 cm/s LV PW:         1.31 cm      LV E/e' medial:  14.9 LV IVS:        0.86 cm      LV e' lateral:   15.80 cm/s LVOT diam:     2.20 cm      LV E/e' lateral: 8.4 LV SV:         77 LV SV Index:   36 LVOT Area:     3.80 cm  LV Volumes (MOD) LV vol d, MOD A2C: 122.0 ml LV vol d, MOD A4C: 128.0 ml LV vol s, MOD A2C: 64.9 ml LV vol s, MOD A4C: 56.7 ml LV SV MOD A2C:     57.1 ml LV SV MOD A4C:     128.0 ml LV SV MOD  BP:      64.8 ml RIGHT VENTRICLE RV Basal diam:  3.71 cm LEFT ATRIUM             Index       RIGHT ATRIUM           Index LA diam:        4.50 cm 2.08 cm/m  RA Area:     16.20 cm LA Vol (A2C):   55.4 ml 25.57 ml/m RA Volume:   47.30 ml  21.83 ml/m LA Vol (A4C):   44.0 ml 20.31 ml/m LA Biplane  Vol: 51.0 ml 23.54 ml/m  AORTIC VALVE                    PULMONIC VALVE AV Area (Vmax):    2.34 cm     PV Vmax:       0.98 m/s AV Area (Vmean):   2.34 cm     PV Vmean:      69.900 cm/s AV Area (VTI):     2.58 cm     PV VTI:        0.190 m AV Vmax:           155.00 cm/s  PV Peak grad:  3.8 mmHg AV Vmean:          102.000 cm/s PV Mean grad:  2.0 mmHg AV VTI:            0.299 m AV Peak Grad:      9.6 mmHg AV Mean Grad:      5.0 mmHg LVOT Vmax:         95.30 cm/s LVOT Vmean:        62.800 cm/s LVOT VTI:          0.203 m LVOT/AV VTI ratio: 0.68  AORTA Ao Root diam: 3.60 cm MITRAL VALVE                TRICUSPID VALVE MV Area (PHT): 4.93 cm     TR Peak grad:   49.0 mmHg MV Peak grad:  8.0 mmHg     TR Vmax:        350.00 cm/s MV Mean grad:  2.0 mmHg MV Vmax:       1.41 m/s     SHUNTS MV Vmean:      59.8 cm/s    Systemic VTI:  0.20 m MV Decel Time: 154 msec     Systemic Diam: 2.20 cm MV E velocity: 133.00 cm/s MV A velocity: 39.00 cm/s MV E/A ratio:  3.41 Adrian Blackwater MD Electronically signed by Adrian Blackwater MD Signature Date/Time: 12/14/2019/2:42:10 PM    Final      Subjective: Patient was feeling better when seen today after cardioversion this morning.  No new complaints, stating that he is feeling much better.  No chest pain or shortness of breath.  Eager to go home.  Discharge Exam: Vitals:   12/15/19 0824 12/15/19 1142  BP: 132/68 (!) 151/89  Pulse: 66 71  Resp: 18 18  Temp: 98.4 F (36.9 C) 98.6 F (37 C)  SpO2: 96% 97%   Vitals:   12/15/19 0800 12/15/19 0810 12/15/19 0824 12/15/19 1142  BP: 136/79 (!) 138/59 132/68 (!) 151/89  Pulse: 65 65 66 71  Resp: 18  18 18   Temp:   98.4 F (36.9 C) 98.6 F (37 C)  TempSrc:   Oral Oral  SpO2: 96% 97% 96% 97%  Weight:  Height:        General: Pt is alert, awake, not in acute distress Cardiovascular: RRR, S1/S2 +, no rubs, no gallops Respiratory: CTA bilaterally, no wheezing, no rhonchi Abdominal: Soft, NT, ND, bowel sounds + Extremities:  no edema, no cyanosis   The results of significant diagnostics from this hospitalization (including imaging, microbiology, ancillary and laboratory) are listed below for reference.    Microbiology: Recent Results (from the past 240 hour(s))  Respiratory Panel by RT PCR (Flu A&B, Covid) - Nasopharyngeal Swab     Status: None   Collection Time: 12/06/19  1:41 AM   Specimen: Nasopharyngeal Swab  Result Value Ref Range Status   SARS Coronavirus 2 by RT PCR NEGATIVE NEGATIVE Final    Comment: (NOTE) SARS-CoV-2 target nucleic acids are NOT DETECTED.  The SARS-CoV-2 RNA is generally detectable in upper respiratoy specimens during the acute phase of infection. The lowest concentration of SARS-CoV-2 viral copies this assay can detect is 131 copies/mL. A negative result does not preclude SARS-Cov-2 infection and should not be used as the sole basis for treatment or other patient management decisions. A negative result may occur with  improper specimen collection/handling, submission of specimen other than nasopharyngeal swab, presence of viral mutation(s) within the areas targeted by this assay, and inadequate number of viral copies (<131 copies/mL). A negative result must be combined with clinical observations, patient history, and epidemiological information. The expected result is Negative.  Fact Sheet for Patients:  https://www.moore.com/https://www.fda.gov/media/142436/download  Fact Sheet for Healthcare Providers:  https://www.young.biz/https://www.fda.gov/media/142435/download  This test is no t yet approved or cleared by the [place] FDA and  has been authorized for detection and/or diagnosis of SARS-CoV-2 by FDA under an Emergency Use Authorization (EUA). This EUA will remain  in effect (meaning this test can be used) for the duration of the COVID-19 declaration under Section 564(b)(1) of the Act, 21 U.S.C. section 360bbb-3(b)(1), unless the authorization is terminated or revoked sooner.     Influenza A by PCR  NEGATIVE NEGATIVE Final   Influenza B by PCR NEGATIVE NEGATIVE Final    Comment: (NOTE) The Xpert Xpress SARS-CoV-2/FLU/RSV assay is intended as an aid in  the diagnosis of influenza from Nasopharyngeal swab specimens and  should not be used as a sole basis for treatment. Nasal washings and  aspirates are unacceptable for Xpert Xpress SARS-CoV-2/FLU/RSV  testing.  Fact Sheet for Patients: https://www.moore.com/https://www.fda.gov/media/142436/download  Fact Sheet for Healthcare Providers: https://www.young.biz/https://www.fda.gov/media/142435/download  This test is not yet approved or cleared by the [place] FDA and  has been authorized for detection and/or diagnosis of SARS-CoV-2 by  FDA under an Emergency Use Authorization (EUA). This EUA will remain  in effect (meaning this test can be used) for the duration of the  Covid-19 declaration under Section 564(b)(1) of the Act, 21  U.S.C. section 360bbb-3(b)(1), unless the authorization is  terminated or revoked. Performed at [facility], [address] [place], [place] [id number]   Respiratory Panel by RT PCR (Flu A&B, Covid) - Nasopharyngeal Swab     Status: None   Collection Time: 12/13/19  3:05 PM   Specimen: Nasopharyngeal Swab  Result Value Ref Range Status   SARS Coronavirus 2 by RT PCR NEGATIVE NEGATIVE Final    Comment: (NOTE) SARS-CoV-2 target nucleic acids are NOT DETECTED.  The SARS-CoV-2 RNA is generally detectable in upper respiratoy specimens during the acute phase of infection. The lowest concentration of SARS-CoV-2 viral copies this assay can detect is 131 copies/mL. A negative result does  not preclude SARS-Cov-2 infection and should not be used as the sole basis for treatment or other patient management decisions. A negative result may occur with  improper specimen collection/handling, submission of specimen other than nasopharyngeal swab, presence of viral mutation(s) within the areas targeted by this assay, and inadequate number of  viral copies (<131 copies/mL). A negative result must be combined with clinical observations, patient history, and epidemiological information. The expected result is Negative.  Fact Sheet for Patients:  https://www.moore.com/  Fact Sheet for Healthcare Providers:  https://www.young.biz/  This test is no t yet approved or cleared by the [place] FDA and  has been authorized for detection and/or diagnosis of SARS-CoV-2 by FDA under an Emergency Use Authorization (EUA). This EUA will remain  in effect (meaning this test can be used) for the duration of the COVID-19 declaration under Section 564(b)(1) of the Act, 21 U.S.C. section 360bbb-3(b)(1), unless the authorization is terminated or revoked sooner.     Influenza A by PCR NEGATIVE NEGATIVE Final   Influenza B by PCR NEGATIVE NEGATIVE Final    Comment: (NOTE) The Xpert Xpress SARS-CoV-2/FLU/RSV assay is intended as an aid in  the diagnosis of influenza from Nasopharyngeal swab specimens and  should not be used as a sole basis for treatment. Nasal washings and  aspirates are unacceptable for Xpert Xpress SARS-CoV-2/FLU/RSV  testing.  Fact Sheet for Patients: https://www.moore.com/  Fact Sheet for Healthcare Providers: https://www.young.biz/  This test is not yet approved or cleared by the [place] FDA and  has been authorized for detection and/or diagnosis of SARS-CoV-2 by  FDA under an Emergency Use Authorization (EUA). This EUA will remain  in effect (meaning this test can be used) for the duration of the  Covid-19 declaration under Section 564(b)(1) of the Act, 21  U.S.C. section 360bbb-3(b)(1), unless the authorization is  terminated or revoked. Performed at [facility], [address] [place], [place] [address]      Labs: BNP (last 3 results) Recent Labs    12/13/19 1505  BNP 771.2*   Basic Metabolic  Panel: Recent Labs  Lab 12/13/19 1505 12/15/19 0440  NA 133* 138  K 4.5 3.6  CL 99 103  CO2 24 25  GLUCOSE 103* 101*  BUN 11 10  CREATININE 0.83 0.82  CALCIUM 9.0 8.9   Liver Function Tests: Recent Labs  Lab 12/13/19 1505  AST 23  ALT 29  ALKPHOS 57  BILITOT 1.2  PROT 7.4  ALBUMIN 4.0   No results for input(s): LIPASE, AMYLASE in the last 168 hours. No results for input(s): AMMONIA in the last 168 hours. CBC: Recent Labs  Lab 12/13/19 1505 12/15/19 0440  WBC 11.1* 8.6  NEUTROABS 8.8*  --   HGB 14.6 13.1  HCT 41.8 38.3*  MCV 90.3 91.4  PLT 260 252   Cardiac Enzymes: No results for input(s): CKTOTAL, CKMB, CKMBINDEX, TROPONINI in the last 168 hours. BNP: Invalid input(s): POCBNP CBG: Recent Labs  Lab 12/14/19 1106 12/14/19 1706 12/14/19 1842 12/14/19 2019 12/15/19 0735  GLUCAP 179* 104* 109* 163* 94   D-Dimer No results for input(s): DDIMER in the last 72 hours. Hgb A1c No results for input(s): HGBA1C in the last 72 hours. Lipid Profile No results for input(s): CHOL, HDL, LDLCALC, TRIG, CHOLHDL, LDLDIRECT in the last 72 hours. Thyroid function studies No results for input(s): TSH, T4TOTAL, T3FREE, THYROIDAB in the last 72 hours.  Invalid input(s): FREET3 Anemia work up No results for input(s): VITAMINB12, FOLATE, FERRITIN,  TIBC, IRON, RETICCTPCT in the last 72 hours. Urinalysis    Component Value Date/Time   COLORURINE YELLOW (A) 12/05/2019 1920   APPEARANCEUR CLEAR (A) 12/05/2019 1920   LABSPEC 1.012 12/05/2019 1920   PHURINE 6.0 12/05/2019 1920   GLUCOSEU NEGATIVE 12/05/2019 1920   HGBUR NEGATIVE 12/05/2019 1920   BILIRUBINUR NEGATIVE 12/05/2019 1920   KETONESUR NEGATIVE 12/05/2019 1920   PROTEINUR NEGATIVE 12/05/2019 1920   NITRITE NEGATIVE 12/05/2019 1920   LEUKOCYTESUR NEGATIVE 12/05/2019 1920   Sepsis Labs Invalid input(s): PROCALCITONIN,  WBC,  LACTICIDVEN Microbiology Recent Results (from the past 240 hour(s))  Respiratory  Panel by RT PCR (Flu A&B, Covid) - Nasopharyngeal Swab     Status: None   Collection Time: 12/06/19  1:41 AM   Specimen: Nasopharyngeal Swab  Result Value Ref Range Status   SARS Coronavirus 2 by RT PCR NEGATIVE NEGATIVE Final    Comment: (NOTE) SARS-CoV-2 target nucleic acids are NOT DETECTED.  The SARS-CoV-2 RNA is generally detectable in upper respiratoy specimens during the acute phase of infection. The lowest concentration of SARS-CoV-2 viral copies this assay can detect is 131 copies/mL. A negative result does not preclude SARS-Cov-2 infection and should not be used as the sole basis for treatment or other patient management decisions. A negative result may occur with  improper specimen collection/handling, submission of specimen other than nasopharyngeal swab, presence of viral mutation(s) within the areas targeted by this assay, and inadequate number of viral copies (<131 copies/mL). A negative result must be combined with clinical observations, patient history, and epidemiological information. The expected result is Negative.  Fact Sheet for Patients:  https://www.moore.com/  Fact Sheet for Healthcare Providers:  https://www.young.biz/  This test is no t yet approved or cleared by the [place] FDA and  has been authorized for detection and/or diagnosis of SARS-CoV-2 by FDA under an Emergency Use Authorization (EUA). This EUA will remain  in effect (meaning this test can be used) for the duration of the COVID-19 declaration under Section 564(b)(1) of the Act, 21 U.S.C. section 360bbb-3(b)(1), unless the authorization is terminated or revoked sooner.     Influenza A by PCR NEGATIVE NEGATIVE Final   Influenza B by PCR NEGATIVE NEGATIVE Final    Comment: (NOTE) The Xpert Xpress SARS-CoV-2/FLU/RSV assay is intended as an aid in  the diagnosis of influenza from Nasopharyngeal swab specimens and  should not be used as a sole basis  for treatment. Nasal washings and  aspirates are unacceptable for Xpert Xpress SARS-CoV-2/FLU/RSV  testing.  Fact Sheet for Patients: https://www.moore.com/  Fact Sheet for Healthcare Providers: https://www.young.biz/  This test is not yet approved or cleared by the [place] FDA and  has been authorized for detection and/or diagnosis of SARS-CoV-2 by  FDA under an Emergency Use Authorization (EUA). This EUA will remain  in effect (meaning this test can be used) for the duration of the  Covid-19 declaration under Section 564(b)(1) of the Act, 21  U.S.C. section 360bbb-3(b)(1), unless the authorization is  terminated or revoked. Performed at [facility], [address] [place], [place] [address]   Respiratory Panel by RT PCR (Flu A&B, Covid) - Nasopharyngeal Swab     Status: None   Collection Time: 12/13/19  3:05 PM   Specimen: Nasopharyngeal Swab  Result Value Ref Range Status   SARS Coronavirus 2 by RT PCR NEGATIVE NEGATIVE Final    Comment: (NOTE) SARS-CoV-2 target nucleic acids are NOT DETECTED.  The SARS-CoV-2 RNA is generally detectable in upper  respiratoy specimens during the acute phase of infection. The lowest concentration of SARS-CoV-2 viral copies this assay can detect is 131 copies/mL. A negative result does not preclude SARS-Cov-2 infection and should not be used as the sole basis for treatment or other patient management decisions. A negative result may occur with  improper specimen collection/handling, submission of specimen other than nasopharyngeal swab, presence of viral mutation(s) within the areas targeted by this assay, and inadequate number of viral copies (<131 copies/mL). A negative result must be combined with clinical observations, patient history, and epidemiological information. The expected result is Negative.  Fact Sheet for Patients:  https://www.moore.com/  Fact Sheet  for Healthcare Providers:  https://www.young.biz/  This test is no t yet approved or cleared by the [place] FDA and  has been authorized for detection and/or diagnosis of SARS-CoV-2 by FDA under an Emergency Use Authorization (EUA). This EUA will remain  in effect (meaning this test can be used) for the duration of the COVID-19 declaration under Section 564(b)(1) of the Act, 21 U.S.C. section 360bbb-3(b)(1), unless the authorization is terminated or revoked sooner.     Influenza A by PCR NEGATIVE NEGATIVE Final   Influenza B by PCR NEGATIVE NEGATIVE Final    Comment: (NOTE) The Xpert Xpress SARS-CoV-2/FLU/RSV assay is intended as an aid in  the diagnosis of influenza from Nasopharyngeal swab specimens and  should not be used as a sole basis for treatment. Nasal washings and  aspirates are unacceptable for Xpert Xpress SARS-CoV-2/FLU/RSV  testing.  Fact Sheet for Patients: https://www.moore.com/  Fact Sheet for Healthcare Providers: https://www.young.biz/  This test is not yet approved or cleared by the [place] FDA and  has been authorized for detection and/or diagnosis of SARS-CoV-2 by  FDA under an Emergency Use Authorization (EUA). This EUA will remain  in effect (meaning this test can be used) for the duration of the  Covid-19 declaration under Section 564(b)(1) of the Act, 21  U.S.C. section 360bbb-3(b)(1), unless the authorization is  terminated or revoked. Performed at [facility], [address] [place], [place] [address]     Time coordinating discharge: Over 30 minutes  SIGNED:  Arnetha Courser, MD  [facility] 12/15/2019, 12:09 PM  If 7PM-7AM, please contact night-coverage [email or web address]  This record has been created using Conservation officer, historic buildings. Errors have been sought and corrected,but may not always be located. Such creation errors do not reflect on the standard  of care.

## 2019-12-15 NOTE — Progress Notes (Signed)
SUBJECTIVE: Patient had electrical cardioversion and was doing very well   Vitals:   12/15/19 0742 12/15/19 0743 12/15/19 0744 12/15/19 0745  BP: (!) 165/96   137/79  Pulse: 85 82 83 66  Resp: 16 18 17 16   Temp:      TempSrc:      SpO2: 98% 98% 97% 97%  Weight:      Height:        Intake/Output Summary (Last 24 hours) at 12/15/2019 0751 Last data filed at 12/14/2019 1703 Gross per 24 hour  Intake --  Output 850 ml  Net -850 ml    LABS: Basic Metabolic Panel: Recent Labs    12/13/19 1505 12/15/19 0440  NA 133* 138  K 4.5 3.6  CL 99 103  CO2 24 25  GLUCOSE 103* 101*  BUN 11 10  CREATININE 0.83 0.82  CALCIUM 9.0 8.9   Liver Function Tests: Recent Labs    12/13/19 1505  AST 23  ALT 29  ALKPHOS 57  BILITOT 1.2  PROT 7.4  ALBUMIN 4.0   No results for input(s): LIPASE, AMYLASE in the last 72 hours. CBC: Recent Labs    12/13/19 1505 12/15/19 0440  WBC 11.1* 8.6  NEUTROABS 8.8*  --   HGB 14.6 13.1  HCT 41.8 38.3*  MCV 90.3 91.4  PLT 260 252   Cardiac Enzymes: No results for input(s): CKTOTAL, CKMB, CKMBINDEX, TROPONINI in the last 72 hours. BNP: Invalid input(s): POCBNP D-Dimer: No results for input(s): DDIMER in the last 72 hours. Hemoglobin A1C: No results for input(s): HGBA1C in the last 72 hours. Fasting Lipid Panel: No results for input(s): CHOL, HDL, LDLCALC, TRIG, CHOLHDL, LDLDIRECT in the last 72 hours. Thyroid Function Tests: No results for input(s): TSH, T4TOTAL, T3FREE, THYROIDAB in the last 72 hours.  Invalid input(s): FREET3 Anemia Panel: No results for input(s): VITAMINB12, FOLATE, FERRITIN, TIBC, IRON, RETICCTPCT in the last 72 hours.   PHYSICAL EXAM General: Well developed, well nourished, in no acute distress HEENT:  Normocephalic and atramatic Neck:  No JVD.  Lungs: Clear bilaterally to auscultation and percussion. Heart: HRRR . Normal S1 and S2 without gallops or murmurs.  Abdomen: Bowel sounds are positive, abdomen soft  and non-tender  Msk:  Back normal, normal gait. Normal strength and tone for age. Extremities: No clubbing, cyanosis or edema.   Neuro: Alert and oriented X 3. Psych:  Good affect, responds appropriately  TELEMETRY: Sinus rhythm  ASSESSMENT AND PLAN: Status post DC electrical cardioversion converted to sinus rhythm with 65 bpm with prior to cardioversion patient was in [name]. fib flutter.  Advised changing amiodarone to 400 mg p.o. daily.  Can come tomorrow at 10:00 or or Thursday for EKG in the office.  May be discharged.  Active Problems:   Congestive heart failure (CHF) (HCC)    Devin [name] A, MD, [facility] 12/15/2019 7:51 AM

## 2019-12-15 NOTE — Progress Notes (Signed)
Discharge instructions explained/pt verbalized understanding. IV and tele removed. Will transport off unit via wheelchair when ride arrives.  

## 2019-12-22 DIAGNOSIS — I4891 Unspecified atrial fibrillation: Secondary | ICD-10-CM | POA: Insufficient documentation

## 2019-12-23 NOTE — Progress Notes (Deleted)
   Patient ID: Devin Gowda., male    DOB: 1945-03-23, 74 y.o.   MRN: [id number]  HPI  Devin [name] is a 74 y/o male with a history of  Echo report from 12/14/19 reviewed and showed an EF of 40-45% along with mild LAE and mild Devin.   LHC done 05/21/19 and showed:  Ost Cx to Prox Cx lesion is 35% stenosed.  Prox LAD to Mid LAD lesion is 40% stenosed.  Dist LM to Prox LAD lesion is 99% stenosed.  Prox RCA lesion is 30% stenosed.  Was in the ED 12/23/19 due to            Admitted 12/13/19 due to pulmonary edema. Cardiology consult obtained. Decreased EF thought to be due to atrial fibrillation. Successful cardioversion done. Discharged after 2 days.   Review of Systems    Physical Exam

## 2019-12-24 ENCOUNTER — Ambulatory Visit: Payer: Medicare HMO | Admitting: Family

## 2020-01-09 NOTE — Progress Notes (Signed)
Patient ID: Devin Gowda., male    DOB: Feb 12, 1946, 75 y.o.   MRN: [id number]  HPI  Devin [name] is a 74 y/o male with a history of HTN, DM, CAD s/p CABG, sleep apnea, paroxysmal atrial fibrillation, hyponatremia, GERD, depression and chronic HF.   Echo report from 12/14/19 reviewed and showed an EF of 40-45% along with mild LAE and mild Devin.   LHC done 05/21/19 and showed:  Ost Cx to Prox Cx lesion is 35% stenosed.  Prox LAD to Mid LAD lesion is 40% stenosed.  Dist LM to Prox LAD lesion is 99% stenosed.  Prox RCA lesion is 30% stenosed.  Was in the ED 12/23/19 due to dislocated finger after a fall where he was treated and released. Admitted 12/13/19 due to pulmonary edema. Cardiology consult obtained. Decreased EF thought to be due to atrial fibrillation. Successful cardioversion done. Discharged after 2 days.   He presents today for his initial visit with a chief complaint of moderate fatigue upon minimal exertion. He describes this as having been present for several months. He has associated shortness of breath, intermittent dizziness, leg weakness and easy bruising along with this. He denies any difficulty sleeping, abdominal distention, palpitations, pedal edema, chest pain, cough or weight gain.   Sees cardiology Welton Flakes) towards the end of November and PCP Gavin Potters) early 2022. Says that he is going to be returning to heart track or physical therapy for exercise. Says that he thinks a prescription/ order has already been sent in. Has a history of hyponatremia so is currently not taking furosemide.  Past Medical History:  Diagnosis Date  . Arrhythmia    atrial fibrillation  . CAD (coronary artery disease)   . CHF (congestive heart failure) (HCC)   . Diabetes mellitus without complication (HCC)   . Hypertension   . MI, old   . Sleep apnea    Past Surgical History:  Procedure Laterality Date  . APPENDECTOMY    . BREAST SURGERY     benign mass  . CARDIOVERSION Right  12/15/2019   Procedure: CARDIOVERSION;  Surgeon: Laurier Nancy, MD;  Location: [facility] ORS;  Service: Cardiovascular;  Laterality: Right;  . CORONARY ANGIOPLASTY WITH STENT PLACEMENT    . LEFT HEART CATH AND CORS/GRAFTS ANGIOGRAPHY N/A 05/21/2019   Procedure: LEFT HEART CATH AND CORONARY ANGIOGRAPHY;  Surgeon: Laurier Nancy, MD;  Location: [facility] INVASIVE CV LAB;  Service: Cardiovascular;  Laterality: N/A;  . REPLACEMENT TOTAL KNEE BILATERAL     Family History  Problem Relation Age of Onset  . Osteoarthritis Mother   . Other Father 47       MVA   Social History   Tobacco Use  . Smoking status: Former Games developer  . Smokeless tobacco: Never Used  . Tobacco comment: Quit over 40 years ago  Substance Use Topics  . Alcohol use: Not Currently    Comment: social   Allergies  Allergen Reactions  . Ace Inhibitors Swelling  . Morphine Swelling  . 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   Prior to Admission medications   Medication Sig Start Date End Date Taking? Authorizing Provider  apixaban (ELIQUIS) 5 MG TABS tablet Take 5 mg by mouth 2 (two) times daily.    Yes [provider]  atorvastatin (LIPITOR) 80 MG tablet Take 1 tablet (80 mg total) by mouth daily. 05/16/19 05/15/20 Yes Agbata, Tochukwu, MD  buPROPion (WELLBUTRIN XL) 300 MG 24 hr tablet Take 300 mg by mouth daily.  Yes [provider]  carvedilol (COREG) 25 MG tablet Take 1 tablet (25 mg total) by mouth 2 (two) times daily. 05/16/19 05/15/20 Yes Agbata, Tochukwu, MD  ENTRESTO 49-51 MG Take 1 tablet by mouth 2 (two) times daily. 11/09/19  Yes [provider]  EPINEPHrine 0.3 mg/0.3 mL IJ SOAJ injection Inject 0.3 mg into the muscle as needed for anaphylaxis.   Yes [provider]  ezetimibe (ZETIA) 10 MG tablet Take 10 mg by mouth daily. 04/06/19  Yes [provider]  gabapentin (NEURONTIN) 300 MG capsule Take 300 mg by mouth 2 (two) times daily. 05/01/19  Yes [provider]   metFORMIN (GLUCOPHAGE) 500 MG tablet Take 500 mg by mouth 2 (two) times daily. 02/17/19  Yes [provider]  nitroGLYCERIN (NITROSTAT) 0.4 MG SL tablet Place 0.4 mg under the tongue every 5 (five) minutes as needed for chest pain.   Yes [provider]  sertraline (ZOLOFT) 25 MG tablet Take 25 mg by mouth daily. 11/27/19  Yes [provider]  sildenafil (VIAGRA) 50 MG tablet Take 50 mg by mouth daily as needed for erectile dysfunction.   Yes [provider]  amiodarone (PACERONE) 400 MG tablet Take 1 tablet (400 mg total) by mouth daily. Patient not taking: Reported on 01/11/2020 12/16/19   Arnetha Courser, MD  furosemide (LASIX) 20 MG tablet Take 1 tablet (20 mg total) by mouth daily. Patient not taking: Reported on 01/11/2020 12/15/19 12/14/20  Arnetha Courser, MD    Review of Systems  Constitutional: Positive for fatigue (easily). Negative for appetite change.  HENT: Negative for congestion, postnasal drip and sore throat.   Eyes: Negative.   Respiratory: Positive for shortness of breath. Negative for cough.   Cardiovascular: Negative for chest pain, palpitations and leg swelling.  Gastrointestinal: Negative for abdominal distention and abdominal pain.  Endocrine: Negative.   Genitourinary: Negative.   Musculoskeletal: Negative for back pain and neck pain.  Skin: Negative.   Allergic/Immunologic: Negative.   Neurological: Positive for dizziness (yesterday) and weakness (in legs yesterday).  Hematological: Negative for adenopathy. Bruises/bleeds easily.  Psychiatric/Behavioral: Negative for dysphoric mood and sleep disturbance. The patient is not nervous/anxious.    Vitals:   01/11/20 1214  BP: 130/71  Pulse: (!) 59  Resp: 18  SpO2: 98%  Weight: 201 lb (91.2 kg)  Height: 6' (1.829 m)   Wt Readings from Last 3 Encounters:  01/11/20 201 lb (91.2 kg)  12/15/19 207 lb (93.9 kg)  12/05/19 208 lb (94.3 kg)   Lab Results  Component Value Date    CREATININE 0.82 12/15/2019   CREATININE 0.83 12/13/2019   CREATININE 0.91 12/07/2019    Physical Exam Vitals and nursing note reviewed.  Constitutional:      Appearance: He is well-developed.  HENT:     Head: Normocephalic and atraumatic.  Neck:     Vascular: No JVD.  Cardiovascular:     Rate and Rhythm: Regular rhythm. Bradycardia present.  Pulmonary:     Effort: Pulmonary effort is normal. No respiratory distress.     Breath sounds: No wheezing, rhonchi or rales.  Abdominal:     Palpations: Abdomen is soft.     Tenderness: There is no abdominal tenderness.  Musculoskeletal:     Cervical back: Neck supple.     Right lower leg: No tenderness. No edema.     Left lower leg: No tenderness. No edema.  Skin:    General: Skin is warm and dry.  Neurological:  General: No focal deficit present.     Mental Status: He is alert and oriented to person, place, and time.  Psychiatric:        Mood and Affect: Mood normal.        Behavior: Behavior normal.     Assessment & Plan:  1: Chronic heart failure with reduced ejection fraction- - NYHA class III - euvolemic today - weighing daily; instructed to call for an overnight weight gain of > 2 pounds or a weekly weight gain of > 5 pounds - occasionally adds salt but not "too much" because he has a history of hyponatremia; has been eating out more than he used to and understands that he's getting more salt that way; low sodium cookbook given to patient - says that he sees cardiology Welton Flakes) later this month (he thinks)  - saw cardiology Renaldo Reel) 01/06/20 - BNP 12/13/19 was 771.2 - report receiving his flu vaccine for this season along with covid vaccines including his booster  2: HTN- - BP looks good today - sees PCP Gavin Potters) early January 2022 - BMP 12/22/19 reviewed and showed sodium 138, potassium 4.3, creatinine 0.9 and GFR 82  3: DM-   - A1c 12/06/19 was 6.0% - glucose in clinic today was 106   Medication list from [facility]  was reviewed.   Return in 2 months or sooner for any questions/problems before then.

## 2020-01-11 ENCOUNTER — Ambulatory Visit: Payer: Medicare HMO | Attending: Family | Admitting: Family

## 2020-01-11 ENCOUNTER — Other Ambulatory Visit: Payer: Self-pay

## 2020-01-11 ENCOUNTER — Encounter: Payer: Self-pay | Admitting: Family

## 2020-01-11 VITALS — BP 130/71 | HR 59 | Resp 18 | Ht 72.0 in | Wt 201.0 lb

## 2020-01-11 DIAGNOSIS — Z951 Presence of aortocoronary bypass graft: Secondary | ICD-10-CM | POA: Insufficient documentation

## 2020-01-11 DIAGNOSIS — I2510 Atherosclerotic heart disease of native coronary artery without angina pectoris: Secondary | ICD-10-CM | POA: Diagnosis not present

## 2020-01-11 DIAGNOSIS — E119 Type 2 diabetes mellitus without complications: Secondary | ICD-10-CM | POA: Diagnosis not present

## 2020-01-11 DIAGNOSIS — Z79899 Other long term (current) drug therapy: Secondary | ICD-10-CM | POA: Insufficient documentation

## 2020-01-11 DIAGNOSIS — Z87891 Personal history of nicotine dependence: Secondary | ICD-10-CM | POA: Diagnosis not present

## 2020-01-11 DIAGNOSIS — I110 Hypertensive heart disease with heart failure: Secondary | ICD-10-CM | POA: Diagnosis not present

## 2020-01-11 DIAGNOSIS — I480 Paroxysmal atrial fibrillation: Secondary | ICD-10-CM | POA: Insufficient documentation

## 2020-01-11 DIAGNOSIS — Z7984 Long term (current) use of oral hypoglycemic drugs: Secondary | ICD-10-CM | POA: Diagnosis not present

## 2020-01-11 DIAGNOSIS — K219 Gastro-esophageal reflux disease without esophagitis: Secondary | ICD-10-CM | POA: Insufficient documentation

## 2020-01-11 DIAGNOSIS — Z7901 Long term (current) use of anticoagulants: Secondary | ICD-10-CM | POA: Diagnosis not present

## 2020-01-11 DIAGNOSIS — I10 Essential (primary) hypertension: Secondary | ICD-10-CM

## 2020-01-11 DIAGNOSIS — E871 Hypo-osmolality and hyponatremia: Secondary | ICD-10-CM | POA: Diagnosis not present

## 2020-01-11 DIAGNOSIS — R42 Dizziness and giddiness: Secondary | ICD-10-CM | POA: Insufficient documentation

## 2020-01-11 DIAGNOSIS — F329 Major depressive disorder, single episode, unspecified: Secondary | ICD-10-CM | POA: Diagnosis not present

## 2020-01-11 DIAGNOSIS — I5022 Chronic systolic (congestive) heart failure: Secondary | ICD-10-CM

## 2020-01-11 LAB — GLUCOSE, CAPILLARY: Glucose-Capillary: 106 mg/dL — ABNORMAL HIGH (ref 70–99)

## 2020-01-11 NOTE — Patient Instructions (Signed)
Continue weighing daily and call for an overnight weight gain of > 2 pounds or a weekly weight gain of >5 pounds. 

## 2020-01-12 ENCOUNTER — Other Ambulatory Visit: Payer: Self-pay

## 2020-01-12 ENCOUNTER — Encounter: Payer: Medicare HMO | Attending: Cardiovascular Disease

## 2020-01-12 VITALS — Ht 71.0 in | Wt 199.7 lb

## 2020-01-12 DIAGNOSIS — I5022 Chronic systolic (congestive) heart failure: Secondary | ICD-10-CM | POA: Insufficient documentation

## 2020-01-12 DIAGNOSIS — Z951 Presence of aortocoronary bypass graft: Secondary | ICD-10-CM | POA: Diagnosis present

## 2020-01-12 NOTE — Progress Notes (Signed)
Pulmonary Individual Treatment Plan  Patient Details  Name: Devin [name]. MRN: [id number] Date of Birth: [birth year] Referring Provider:     Pulmonary Rehab from 01/12/2020 in [facility] Cardiac and Pulmonary Rehab  Referring Provider Humphrey Rolls      Initial Encounter Date:    Pulmonary Rehab from 01/12/2020 in [facility] Cardiac and Pulmonary Rehab  Date 01/12/20      Visit Diagnosis: Heart failure, chronic systolic (HCC)  Patient's Home Medications on Admission:  Current Outpatient Medications:  .  amiodarone (PACERONE) 400 MG tablet, Take 1 tablet (400 mg total) by mouth daily. (Patient not taking: Reported on 01/11/2020), Disp: 30 tablet, Rfl: 0 .  apixaban (ELIQUIS) 5 MG TABS tablet, Take 5 mg by mouth 2 (two) times daily. , Disp: , Rfl:  .  atorvastatin (LIPITOR) 80 MG tablet, Take 1 tablet (80 mg total) by mouth daily., Disp: 30 tablet, Rfl: 11 .  buPROPion (WELLBUTRIN XL) 300 MG 24 hr tablet, Take 300 mg by mouth daily. , Disp: , Rfl:  .  carvedilol (COREG) 25 MG tablet, Take 1 tablet (25 mg total) by mouth 2 (two) times daily., Disp: 60 tablet, Rfl: 11 .  ENTRESTO 49-51 MG, Take 1 tablet by mouth 2 (two) times daily., Disp: , Rfl:  .  EPINEPHrine 0.3 mg/0.3 mL IJ SOAJ injection, Inject 0.3 mg into the muscle as needed for anaphylaxis., Disp: , Rfl:  .  ezetimibe (ZETIA) 10 MG tablet, Take 10 mg by mouth daily., Disp: , Rfl:  .  furosemide (LASIX) 20 MG tablet, Take 1 tablet (20 mg total) by mouth daily. (Patient not taking: Reported on 01/11/2020), Disp: 30 tablet, Rfl: 11 .  gabapentin (NEURONTIN) 300 MG capsule, Take 300 mg by mouth 2 (two) times daily., Disp: , Rfl:  .  metFORMIN (GLUCOPHAGE) 500 MG tablet, Take 500 mg by mouth 2 (two) times daily., Disp: , Rfl:  .  nitroGLYCERIN (NITROSTAT) 0.4 MG SL tablet, Place 0.4 mg under the tongue every 5 (five) minutes as needed for chest pain., Disp: , Rfl:  .  sertraline (ZOLOFT) 25 MG tablet, Take 25 mg by mouth daily., Disp: , Rfl:  .   sildenafil (VIAGRA) 50 MG tablet, Take 50 mg by mouth daily as needed for erectile dysfunction., Disp: , Rfl:  No current facility-administered medications for this visit.  Facility-Administered Medications Ordered in Other Visits:  .  sodium chloride flush (NS) 0.9 % injection 3 mL, 3 mL, Intravenous, Q12H, Dionisio David, MD  Past Medical History: Past Medical History:  Diagnosis Date  . Arrhythmia    atrial fibrillation  . CAD (coronary artery disease)   . CHF (congestive heart failure) ([place])   . Diabetes mellitus without complication ([place])   . Hypertension   . MI, old   . Sleep apnea     Tobacco Use: Social History   Tobacco Use  Smoking Status Former Smoker  Smokeless Tobacco Never Used  Tobacco Comment   Quit over 40 years ago    Labs: Recent Review [facility] Cardiac and Pulmonary Rehab Latest Ref Rng & Units 05/16/2019 12/06/2019   Hemoglobin A1c 4.8 - 5.6 % 5.8(H) 6.0(H)       Pulmonary Assessment Scores:  Pulmonary Assessment Scores    Row Name 01/12/20 1503         ADL UCSD   SOB Score total 34     Rest 0     Walk 1  Stairs 4     Bath 1     Dress 1     Shop 1       CAT Score   CAT Score 2       mMRC Score   mMRC Score 1            UCSD: Self-administered rating of dyspnea associated with activities of daily living (ADLs) 6-point scale (0 = "not at all" to 5 = "maximal or unable to do because of breathlessness")  Scoring Scores range from 0 to 120.  Minimally important difference is 5 units  CAT: CAT can identify the health impairment of COPD patients and is better correlated with disease progression.  CAT has a scoring range of zero to 40. The CAT score is classified into four groups of low (less than 10), medium (10 - 20), high (21-30) and very high (31-40) based on the impact level of disease on health status. A CAT score over 10 suggests significant symptoms.  A worsening CAT score could be explained by an  exacerbation, poor medication adherence, poor inhaler technique, or progression of COPD or comorbid conditions.  CAT MCID is 2 points  mMRC: mMRC (Modified Medical Research Council) Dyspnea Scale is used to assess the degree of baseline functional disability in patients of respiratory disease due to dyspnea. No minimal important difference is established. A decrease in score of 1 point or greater is considered a positive change.   Pulmonary Function Assessment:   Exercise Target Goals: Exercise Program Goal: Individual exercise prescription set using results from initial 6 min walk test and THRR while considering  patient's activity barriers and safety.   Exercise Prescription Goal: Initial exercise prescription builds to 30-45 minutes a day of aerobic activity, 2-3 days per week.  Home exercise guidelines will be given to patient during program as part of exercise prescription that the participant will acknowledge.  Education: Aerobic Exercise & Resistance Training: - Gives group verbal and written instruction on the various components of exercise. Focuses on aerobic and resistive training programs and the benefits of this training and how to safely progress through these programs..   Education: Exercise & Equipment Safety: - Individual verbal instruction and demonstration of equipment use and safety with use of the equipment.   Pulmonary Rehab from 01/12/2020 in [facility] Cardiac and Pulmonary Rehab  Date 01/12/20  Educator AS  Instruction Review Code 1- Verbalizes Understanding      Education: Exercise Physiology & General Exercise Guidelines: - Group verbal and written instruction with models to review the exercise physiology of the cardiovascular system and associated critical values. Provides general exercise guidelines with specific guidelines to those with heart or lung disease.    Cardiac Rehab from 10/07/2019 in [name] Cardiac and Pulmonary Rehab  Date 09/23/19  Educator AS    Instruction Review Code 1- Verbalizes Understanding      Education: Flexibility, Balance, Mind/Body Relaxation: Provides group verbal/written instruction on the benefits of flexibility and balance training, including mind/body exercise modes such as yoga, pilates and tai chi.  Demonstration and skill practice provided.   Activity Barriers & Risk Stratification:   6 Minute Walk:  6 Minute Walk    Row Name 10/26/19 1419 01/12/20 1455       6 Minute Walk   Phase Discharge Initial    Distance -- 680 feet    Distance % Change 1.38 % --    Distance Feet Change 15 ft --    Walk Time 6 minutes  6 minutes    # of Rest Breaks 0 0    MPH 2.08 1.28    METS 2.25 1.3    RPE 11 13    Perceived Dyspnea  -- 1    VO2 Peak 7.87 4.57    Symptoms No No    Resting HR 80 bpm 61 bpm    Resting BP 130/64 136/60    Resting Oxygen Saturation  -- 95 %    Exercise Oxygen Saturation  during 6 min walk -- 94 %    Max Ex. HR 92 bpm 74 bpm    Max Ex. BP 142/72 124/66    2 Minute Post BP -- 126/66      Interval HR   1 Minute HR -- 65    2 Minute HR -- 74    3 Minute HR -- 69    4 Minute HR -- 67    5 Minute HR -- 66    6 Minute HR -- 68    2 Minute Post HR -- 64    Interval Heart Rate? -- Yes      Interval Oxygen   Interval Oxygen? -- Yes    Baseline Oxygen Saturation % -- 95 %    1 Minute Oxygen Saturation % -- 94 %    1 Minute Liters of Oxygen -- 0 L    2 Minute Oxygen Saturation % -- 95 %    2 Minute Liters of Oxygen -- 0 L    3 Minute Oxygen Saturation % -- 94 %    3 Minute Liters of Oxygen -- 0 L    4 Minute Oxygen Saturation % -- 95 %    4 Minute Liters of Oxygen -- 0 L    5 Minute Oxygen Saturation % -- 96 %    5 Minute Liters of Oxygen -- 0 L    6 Minute Oxygen Saturation % -- 96 %    6 Minute Liters of Oxygen -- 0 L    2 Minute Post Oxygen Saturation % -- 95 %    2 Minute Post Liters of Oxygen -- 0 L          Oxygen Initial Assessment:   Oxygen  Re-Evaluation:   Oxygen Discharge (Final Oxygen Re-Evaluation):   Initial Exercise Prescription:  Initial Exercise Prescription - 01/12/20 1400      Date of Initial Exercise RX and Referring Provider   Date 01/12/20    Referring Provider Humphrey Rolls      Treadmill   MPH 1    Grade 0.5    Minutes 15    METs 1.83      Recumbant Bike   Level 1    Minutes 15    METs 1.5      NuStep   Level 1    SPM 80    Minutes 15    METs 1.5      Recumbant Elliptical   Level 1    RPM 50    Minutes 15    METs 1.5      REL-XR   Level 1    Watts 50    Minutes 15    METs 1.5      Biostep-RELP   Level 1    SPM 50    Minutes 15    METs 1.5      Prescription Details   Frequency (times per week) 3    Duration Progress to 30 minutes of continuous aerobic without signs/symptoms  of physical distress      Intensity   THRR 40-80% of Max Heartrate 95-128    Ratings of Perceived Exertion 11-15    Perceived Dyspnea 0-4      Resistance Training   Training Prescription Yes    Weight 3 lb    Reps 10-15           Perform Capillary Blood Glucose checks as needed.  Exercise Prescription Changes:  Exercise Prescription Changes    Row Name 07/28/19 1300 08/11/19 1500 08/25/19 1400 09/08/19 1300 09/22/19 1300     Response to Exercise   Blood Pressure (Admit) 104/54 100/64 122/66 126/56 110/72   Blood Pressure (Exercise) 128/64 138/54 138/78 126/70 152/70   Blood Pressure (Exit) 112/62 94/64 122/64 96/54 104/66   Heart Rate (Admit) 70 bpm 68 bpm 84 bpm 69 bpm 71 bpm   Heart Rate (Exercise) 95 bpm 89 bpm 129 bpm 93 bpm 88 bpm   Heart Rate (Exit) 65 bpm 64 bpm 71 bpm 67 bpm 64 bpm   Rating of Perceived Exertion (Exercise) 17 17 14 17 15    Symptoms none none none fatigue on elliptical --   Duration Continue with 30 min of aerobic exercise without signs/symptoms of physical distress. Continue with 30 min of aerobic exercise without signs/symptoms of physical distress. Continue with 30 min of  aerobic exercise without signs/symptoms of physical distress. Continue with 30 min of aerobic exercise without signs/symptoms of physical distress. Continue with 30 min of aerobic exercise without signs/symptoms of physical distress.   Intensity THRR unchanged THRR unchanged THRR unchanged THRR unchanged THRR unchanged     Progression   Progression Continue to progress workloads to maintain intensity without signs/symptoms of physical distress. Continue to progress workloads to maintain intensity without signs/symptoms of physical distress. Continue to progress workloads to maintain intensity without signs/symptoms of physical distress. Continue to progress workloads to maintain intensity without signs/symptoms of physical distress. Continue to progress workloads to maintain intensity without signs/symptoms of physical distress.   Average METs 2.3 2.42 3.85 3.13 2.75     Resistance Training   Training Prescription Yes Yes Yes Yes Yes   Weight 4 lb 4 lb 5 lb 5 lb 5 lb   Reps 10-15 10-15 10-15 10-15 10-15     Interval Training   Interval Training No No No No No     Treadmill   MPH 2 2 -- 2.4 2.6   Grade 0.5 0.5 -- 0.5 0.5   Minutes 15 15 -- 15 15   METs 2.67 2.67 -- 3 3.17     Recumbant Bike   Level -- 4 -- -- --   Minutes -- 15 -- -- --   METs -- 2.3 -- -- --     NuStep   Level -- 4 4 -- 5   SPM -- -- 80 -- 80   Minutes -- 15 15 -- 15   METs -- 2.3 4 -- 2.3     Elliptical   Level 1 -- -- 1 --   Speed 2 -- -- 2 --   Minutes 15 -- -- 15 --     REL-XR   Level -- 1 -- 5 --   Minutes -- 15 -- 15 --   METs -- -- -- 4.1 --     T5 Nustep   Level -- 3 -- 3 --   Minutes -- 15 -- 15 --   METs -- 2 -- 2.4 --     Biostep-RELP  Level -- -- 4 -- --   SPM -- -- 50 -- --   Minutes -- -- 15 -- --   METs -- -- 3 -- --     Home Exercise Plan   Plans to continue exercise at -- Home (comment)  walking Home (comment)  walking Home (comment)  walking Home (comment)  walking   Frequency  -- Add 2 additional days to program exercise sessions. Add 2 additional days to program exercise sessions. Add 2 additional days to program exercise sessions. Add 2 additional days to program exercise sessions.   Initial Home Exercises Provided -- 08/03/19 08/03/19 08/03/19 08/03/19   Row Name 10/06/19 1300 10/20/19 1300 11/03/19 1300 01/12/20 1500       Response to Exercise   Blood Pressure (Admit) 110/60 146/74 118/62 136/60    Blood Pressure (Exercise) 180/88 162/76 142/76 124/66    Blood Pressure (Exit) 118/70 128/74 100/60 126/66    Heart Rate (Admit) 70 bpm 75 bpm 70 bpm 61 bpm    Heart Rate (Exercise) 90 bpm 90 bpm 109 bpm 74 bpm    Heart Rate (Exit) 69 bpm 73 bpm 103 bpm 64 bpm    Rating of Perceived Exertion (Exercise) 17 15 13 13     Perceived Dyspnea (Exercise) -- -- -- 1    Symptoms none none none --    Duration Continue with 30 min of aerobic exercise without signs/symptoms of physical distress. Continue with 30 min of aerobic exercise without signs/symptoms of physical distress. Continue with 30 min of aerobic exercise without signs/symptoms of physical distress. --    Intensity THRR unchanged THRR unchanged THRR unchanged --      Progression   Progression Continue to progress workloads to maintain intensity without signs/symptoms of physical distress. Continue to progress workloads to maintain intensity without signs/symptoms of physical distress. Continue to progress workloads to maintain intensity without signs/symptoms of physical distress. --    Average METs 3.29 3 3.53 --      Resistance Training   Training Prescription Yes Yes Yes --    Weight 5 lb 5 lb 5 lb --    Reps 10-15 10-15 10-15 --      Interval Training   Interval Training No No No --      Treadmill   MPH 2.6 2.3 2.3 --    Grade 0.5 0.5 0 --    Minutes 15 15 15  --    METs 3.17 2.92 2.76 --      REL-XR   Level 5 6 6  --    Watts -- 50 -- --    Minutes 15 15 15  --    METs 3.3 3 4.3 --      T5 Nustep    Level 5 -- -- --    Minutes 15 -- -- --    METs 3.4 -- -- --      Home Exercise Plan   Plans to continue exercise at Home (comment)  walking Home (comment)  walking Home (comment)  walking --    Frequency Add 2 additional days to program exercise sessions. Add 2 additional days to program exercise sessions. Add 2 additional days to program exercise sessions. --    Initial Home Exercises Provided 08/03/19 08/03/19 08/03/19 --           Exercise Comments:   Exercise Goals and Review:  Exercise Goals    Row Name 01/12/20 1501  Exercise Goals   Increase Physical Activity Yes       Intervention Provide advice, education, support and counseling about physical activity/exercise needs.;Develop an individualized exercise prescription for aerobic and resistive training based on initial evaluation findings, risk stratification, comorbidities and participant's personal goals.       Expected Outcomes Long Term: Add in home exercise to make exercise part of routine and to increase amount of physical activity.;Long Term: Exercising regularly at least 3-5 days a week.;Short Term: Attend rehab on a regular basis to increase amount of physical activity.       Intervention Provide advice, education, support and counseling about physical activity/exercise needs.;Develop an individualized exercise prescription for aerobic and resistive training based on initial evaluation findings, risk stratification, comorbidities and participant's personal goals.       Expected Outcomes Short Term: Increase workloads from initial exercise prescription for resistance, speed, and METs.;Short Term: Perform resistance training exercises routinely during rehab and add in resistance training at home;Long Term: Improve cardiorespiratory fitness, muscular endurance and strength as measured by increased METs and functional capacity (6MWT)       Able to understand and use rate of perceived exertion (RPE) scale Yes        Intervention Provide education and explanation on how to use RPE scale       Expected Outcomes Short Term: Able to use RPE daily in rehab to express subjective intensity level;Long Term:  Able to use RPE to guide intensity level when exercising independently       Able to understand and use Dyspnea scale Yes       Intervention Provide education and explanation on how to use Dyspnea scale       Expected Outcomes Short Term: Able to use Dyspnea scale daily in rehab to express subjective sense of shortness of breath during exertion;Long Term: Able to use Dyspnea scale to guide intensity level when exercising independently       Knowledge and understanding of Target Heart Rate Range (THRR) Yes       Intervention Provide education and explanation of THRR including how the numbers were predicted and where they are located for reference       Expected Outcomes Short Term: Able to state/look up THRR;Short Term: Able to use daily as guideline for intensity in rehab;Long Term: Able to use THRR to govern intensity when exercising independently       Able to check pulse independently Yes       Intervention Provide education and demonstration on how to check pulse in carotid and radial arteries.;Review the importance of being able to check your own pulse for safety during independent exercise       Expected Outcomes Short Term: Able to explain why pulse checking is important during independent exercise;Long Term: Able to check pulse independently and accurately       Intervention Provide education, explanation, and written materials on patient's individual exercise prescription       Expected Outcomes Short Term: Able to explain program exercise prescription;Long Term: Able to explain home exercise prescription to exercise independently              Exercise Goals Re-Evaluation :  Exercise Goals Re-Evaluation    Row Name 07/28/19 1339 08/05/19 1409 08/11/19 1543 08/19/19 1345 08/25/19 1456     Exercise  Goal Re-Evaluation   Exercise Goals Review Increase Physical Activity;Increase Strength and Stamina;Understanding of Exercise Prescription Increase Physical Activity;Increase Strength and Stamina;Understanding of Exercise Prescription;Able to understand and use  rate of perceived exertion (RPE) scale;Knowledge and understanding of Target Heart Rate Range (THRR);Able to check pulse independently Increase Physical Activity;Increase Strength and Stamina;Understanding of Exercise Prescription Increase Physical Activity;Increase Strength and Stamina;Understanding of Exercise Prescription Increase Physical Activity;Increase Strength and Stamina;Understanding of Exercise Prescription   Comments Ed is up to 6 min on the ellipitical.  Staff will have him progress slowly to 15 min. Reviewed home exercise with pt today.  Pt plans to walk at home and use [employer] for exercise.  Reviewed THR, pulse, RPE, sign and symptoms, NTG use, and when to call 911 or MD.  Also discussed weather considerations and indoor options.  Pt voiced understanding. Ed has been doing well in rehab.  He is up to level 4 on the NuStep and has asked to switch off the elliptical as it is too hard for him.  We have rearranged his equipment.  We will continue to montior his progress. Ed has been doing well in rehab.  He is walking with his dog at home a 1/4 mile each day.  We talked about extending this time out some.  He does note he has some more stamina overall. Ed is making steady progress.  He has increased to 2.4 mph on TM and 5 lb weights for strength.   Expected Outcomes Short: work on extending time on elliptical Long:  improve overall stamina Short: add one day in adition to program sessions Long: exercise on his own Short: Try out new equipment rotation  Long: Continue to improve stamina Short: Walk longer at home Long; Continue to improve stamina. Short: continue to increase workloads Long :  improve MET level   Row Name 09/08/19 1329 09/09/19  1338 09/22/19 1320 10/06/19 1353 10/20/19 1353     Exercise Goal Re-Evaluation   Exercise Goals Review Increase Physical Activity;Increase Strength and Stamina;Understanding of Exercise Prescription Increase Physical Activity;Increase Strength and Stamina;Understanding of Exercise Prescription Increase Physical Activity;Increase Strength and Stamina;Understanding of Exercise Prescription Increase Physical Activity;Increase Strength and Stamina;Understanding of Exercise Prescription Increase Physical Activity;Increase Strength and Stamina;Understanding of Exercise Prescription   Comments Ed continues to do well in rehab.  He has decided to drop back to twice a week due to his schedule.  He was able to get back on the ellipitcal for the first time yesterday and actually able to do 12 min!!!  We will continue to monitor his progress. Ed is doing well and continuing to walk on his off days. He is walking an extra day outside of rehab, he is interested in  adding more days on to his schedule. He walks for about 1 hour and plans to walk in the mall on days that are too hot. Ed is making steady progress.  he has increase to 2.6 mph on TM. Ed has been doing well in rehab.  He is now up to level 5 on the T5 NuStep and XR.  We will continue to monitor his progress. Ed continues to do well with exercise.  He is now on level 6 for XR and NS.   Expected Outcomes Short: Continue to improve stamina on the elliptical Long: Continue to improve stamina. Short: Add more walking to weekly routines Long: Continue to increase strength/endurace, progress MET level Short: attend consistently Long:  increase stamina Short: Continue to increase workloads Long; Continue to improve stamina. Short: Continue to increase workloads Long; Continue to improve stamina.   [place] Name 10/26/19 1435 11/03/19 1354  Exercise Goal Re-Evaluation   Exercise Goals Review Increase Physical Activity;Increase Strength and Stamina;Understanding  of Exercise Prescription Increase Physical Activity;Increase Strength and Stamina;Understanding of Exercise Prescription      Comments Ed is set to graduate next week.  He improved his post 6MWT some. He is planning to continue to exericse at [employer] in [place] using his [place]. Ed was out on vacation this week.  He will graduate next week and go to [employer].      Expected Outcomes Continue to exercise independently. Continue to exercise independently.             Discharge Exercise Prescription (Final Exercise Prescription Changes):  Exercise Prescription Changes - 01/12/20 1500      Response to Exercise   Blood Pressure (Admit) 136/60    Blood Pressure (Exercise) 124/66    Blood Pressure (Exit) 126/66    Heart Rate (Admit) 61 bpm    Heart Rate (Exercise) 74 bpm    Heart Rate (Exit) 64 bpm    Rating of Perceived Exertion (Exercise) 13    Perceived Dyspnea (Exercise) 1           Nutrition:  Target Goals: Understanding of nutrition guidelines, daily intake of sodium '1500mg'$ , cholesterol '200mg'$ , calories 30% from fat and 7% or less from saturated fats, daily to have 5 or more servings of fruits and vegetables.  Education: Controlling Sodium/Reading Food Labels -Group verbal and written material supporting the discussion of sodium use in heart healthy nutrition. Review and explanation with models, verbal and written materials for utilization of the food label.   Education: General Nutrition Guidelines/Fats and Fiber: -Group instruction provided by verbal, written material, models and posters to present the general guidelines for heart healthy nutrition. Gives an explanation and review of dietary fats and fiber.   Biometrics:  Pre Biometrics - 01/12/20 1502      Pre Biometrics   Height $Remov'5\' 11"'aomUYU$  (1.803 m)    Weight 199 lb 11.2 oz (90.6 kg)    BMI (Calculated) 27.86    Single Leg Stand 0 seconds           Post Biometrics - 10/26/19 1420       Post   Biometrics   Height 5' 11.1" (1.806 m)    Weight 208 lb (94.3 kg)    BMI (Calculated) 28.93    Single Leg Stand 3.26 seconds           Nutrition Therapy Plan and Nutrition Goals:   Nutrition Assessments:  Nutrition Assessments - 01/12/20 1505      MEDFICTS Scores   Pre Score 13           MEDIFICTS Score Key:          ?70 Need to make dietary changes          40-70 Heart Healthy Diet         ? 40 Therapeutic Level Cholesterol Diet  Nutrition Goals Re-Evaluation:  Nutrition Goals Re-Evaluation    [place] Name 09/09/19 1355 09/28/19 1415 10/26/19 1441         Goals   Nutrition Goal ST: substantial snack before and after exercise (bean salad) and bring applesauce to rehab LT: Eat for heart and diabetes ST: substantial snack before and after exercise (bean salad) and bring applesauce to rehab, try to add greens to every dinner LT: Eat for heart and diabetes Better snacks and add more greens     Comment Continue to follow RD changes and  recommendations Discussed how to add more greens to meals as pt reports struggling with this. Ed is planning to continue to follow changes that he has already made.  He is working on snacking and adding in greens.     Expected Outcome Short: Follow regular changes Long: Maintain heart healthy diet ST: substantial snack before and after exercise (bean salad) and bring applesauce to rehab, try to add greens to every dinner Continue to follow heart healthy changes already made.            Nutrition Goals Discharge (Final Nutrition Goals Re-Evaluation):  Nutrition Goals Re-Evaluation - 10/26/19 1441      Goals   Nutrition Goal Better snacks and add more greens    Comment Ed is planning to continue to follow changes that he has already made.  He is working on snacking and adding in greens.    Expected Outcome Continue to follow heart healthy changes already made.           Psychosocial: Target Goals: Acknowledge presence or absence of  significant depression and/or stress, maximize coping skills, provide positive support system. Participant is able to verbalize types and ability to use techniques and skills needed for reducing stress and depression.   Education: Depression - Provides group verbal and written instruction on the correlation between heart/lung disease and depressed mood, treatment options, and the stigmas associated with seeking treatment.   Education: Sleep Hygiene -Provides group verbal and written instruction about how sleep can affect your health.  Define sleep hygiene, discuss sleep cycles and impact of sleep habits. Review good sleep hygiene tips.    Education: Stress and Anxiety: - Provides group verbal and written instruction about the health risks of elevated stress and causes of high stress.  Discuss the correlation between heart/lung disease and anxiety and treatment options. Review healthy ways to manage with stress and anxiety.   Initial Review & Psychosocial Screening:   Quality of Life Scores:  Quality of Life - 10/28/19 1424      Quality of Life Scores   Health/Function Pre 26.57 %    Health/Function Post 24.23 %    Health/Function % Change -8.81 %    Socioeconomic Pre 22.63 %    Socioeconomic Post 20 %    Socioeconomic % Change  -11.62 %    Psych/Spiritual Pre 29.14 %    Psych/Spiritual Post 24.2 %    Psych/Spiritual % Change -16.95 %    Family Pre 17.67 %    Family Post 13 %    Family % Change -26.43 %    GLOBAL Pre 25.72 %    GLOBAL Post 23.12 %    GLOBAL % Change -10.11 %          Scores of 19 and below usually indicate a poorer quality of life in these areas.  A difference of  2-3 points is a clinically meaningful difference.  A difference of 2-3 points in the total score of the Quality of Life Index has been associated with significant improvement in overall quality of life, self-image, physical symptoms, and general health in studies assessing change in quality of  life.  PHQ-9: Recent Review Flowsheet Data    Depression screen [facility] 2/9 01/12/2020 10/28/2019 09/09/2019 07/02/2019   Decreased Interest 3 2 1 2    Down, Depressed, Hopeless 0 0 0 1   PHQ - 2 Score 3 2 1 3    Altered sleeping 1  3 3 3    Tired, decreased energy 2 2  0 2   Change in appetite 1  1 0 1   Feeling bad or failure about yourself  0 0 0 0   Trouble concentrating 0 1 0 0   Moving slowly or fidgety/restless 0 1 0 0   Suicidal thoughts 1  0 0 0   PHQ-9 Score $RemoveBef'8 10 4 9   '[id number]$ Difficult doing work/chores Not difficult at all Somewhat difficult Not difficult at all Not difficult at all     Interpretation of Total Score  Total Score Depression Severity:  1-4 = Minimal depression, 5-9 = Mild depression, 10-14 = Moderate depression, 15-19 = Moderately severe depression, 20-27 = Severe depression   Psychosocial Evaluation and Intervention:  Psychosocial Evaluation - 10/26/19 1443      Discharge Psychosocial Assessment & Intervention   Comments Ed will be graduating next week.  He has enjoyed the program and getting  back into the routine of exericse.  He enjoyed getting to know the staff as well.  He plans to continue to exercise by going to [employer] in [place] and will contineu to keep eye on his risk factors.  He has learned some use tools and tips in the program.           Psychosocial Re-Evaluation:  Psychosocial Re-Evaluation    Naselle Name 08/19/19 1349 09/09/19 1353           Psychosocial Re-Evaluation   Current issues with Current Sleep Concerns;Current Stress Concerns Current Sleep Concerns;Current Stress Concerns      Comments Ed is doing better mentally.  He is sleeping better now.  He enjoys getting out to meet people and coming to class. Ed reports he doing well mentally. He is not sleeping to well due to his sleep apnea but thinks it has been better since the beginning. Denies any big stressors in his life. Stays compliant with antidepressants and reports making him feel  well. PHQ imoroved by 5 points since last time!      Expected Outcomes Short: Continue to attend rehab for mental boost Long; Continue to stay positive Short: Focus on maintaining regular sleep patterns, continue to exercise for mental health Long: Maintain positive attitude      Interventions Encouraged to attend Cardiac Rehabilitation for the exercise Encouraged to attend Cardiac Rehabilitation for the exercise      Continue Psychosocial Services  Follow up required by staff Follow up required by staff             Psychosocial Discharge (Final Psychosocial Re-Evaluation):  Psychosocial Re-Evaluation - 09/09/19 1353      Psychosocial Re-Evaluation   Current issues with Current Sleep Concerns;Current Stress Concerns    Comments Ed reports he doing well mentally. He is not sleeping to well due to his sleep apnea but thinks it has been better since the beginning. Denies any big stressors in his life. Stays compliant with antidepressants and reports making him feel well. PHQ imoroved by 5 points since last time!    Expected Outcomes Short: Focus on maintaining regular sleep patterns, continue to exercise for mental health Long: Maintain positive attitude    Interventions Encouraged to attend Cardiac Rehabilitation for the exercise    Continue Psychosocial Services  Follow up required by staff           Education: Education Goals: Education classes will be provided on a weekly basis, covering required topics. Participant will state understanding/return demonstration of topics presented.  Learning Barriers/Preferences:   General Pulmonary Education Topics:  Infection Prevention: - Provides verbal and written material to individual with discussion of infection control including proper hand washing and proper equipment cleaning during exercise session.   Pulmonary Rehab from 01/12/2020 in [facility] Cardiac and Pulmonary Rehab  Date 01/12/20  Educator AS  Instruction Review Code 1- Verbalizes  Understanding      Falls Prevention: - Provides verbal and written material to individual with discussion of falls prevention and safety.   Pulmonary Rehab from 01/12/2020 in [name] Pulmonary Re[facility]  Date 01/12/20  Educator AS  Instruction Review Code 1- Verbalizes Understanding      Chronic Lung Diseases: - Group verbal and written instruction to review updates, respiratory medications, advancements in procedures and treatments. Discuss use of supplemental oxygen including available portable oxygen systems, continuous and intermittent flow rates, concentrators, personal use and safety guidelines. Review proper use of inhaler and spacers. Provide informative websites for self-education.    Energy Conservation: - Provide group verbal and written instruction for methods to conserve energy, plan and organize activities. Instruct on pacing techniques, use of adaptive equipment and posture/positioning to relieve shortness of breath.   Triggers and Exacerbations: - Group verbal and written instruction to review types of environmental triggers and ways to prevent exacerbations. Discuss weather changes, air quality and the benefits of nasal washing. Review warning signs and symptoms to help prevent infections. Discuss techniques for effective airway clearance, coughing, and vibrations.   AED/CPR: - Group verbal and written instruction with the use of models to demonstrate the basic use of the AED with the basic ABC's of resuscitation.   Anatomy and Physiology of the Lungs: - Group verbal and written instruction with the use of models to provide basic lung anatomy and physiology related to function, structure and complications of lung disease.   Anatomy & Physiology of the Heart: - Group verbal and written instruction and models provide basic cardiac anatomy and physiology, with the coronary electrical and arterial systems. Review of Valvular disease and Heart Failure   Cardiac  Medications: - Group verbal and written instruction to review commonly prescribed medications for heart disease. Reviews the medication, class of the drug, and side effects.   Cardiac Rehab from 10/07/2019 in [facility] Cardiac and Pulmonary Rehab  Date 10/07/19  Educator SB  Instruction Review Code 1- Verbalizes Understanding      Other: -Provides group and verbal instruction on various topics (see comments)   Knowledge Questionnaire Score:  Knowledge Questionnaire Score - 01/12/20 1503      Knowledge Questionnaire Score   Pre Score 13/18 oxygen            Core Components/Risk Factors/Patient Goals at Admission:   Education:Diabetes - Individual verbal and written instruction to review signs/symptoms of diabetes, desired ranges of glucose level fasting, after meals and with exercise. Acknowledge that pre and post exercise glucose checks will be done for 3 sessions at entry of program.   Education: Know Your Numbers and Risk Factors: -Group verbal and written instruction about important numbers in your health.  Discussion of what are risk factors and how they play a role in the disease process.  Review of Cholesterol, Blood Pressure, Diabetes, and BMI and the role they play in your overall health.   Core Components/Risk Factors/Patient Goals Review:   Goals and Risk Factor Review    Row Name 08/19/19 1350 09/09/19 1343 10/26/19 1442         Core Components/Risk Factors/Patient Goals Review   Personal Goals Review Weight Management/Obesity;Hypertension;Diabetes;Lipids Weight Management/Obesity;Hypertension;Diabetes;Lipids  Weight Management/Obesity;Hypertension;Diabetes;Lipids     Review Ed is doing well in rehab.  His weight is trending down.  He keeps a good eye on his pressures and sugars.  His pressures have continued to do well.  When he first started, his sugars were dropping with exercise, now he has made changes and is staying stable. Ed would like to lose weight but is  struggling to do so as he said his bowel movements are not regular, he plans to talk to his doctor about it at his upcoming appointment. Reports that he takes his blood sugars at home but does not do it as much as he would like to, reports normal ranges at home. He will plan to check more often and spoke about setting up alarms to remind him. Ed states his BPs have been stable here at rehab. He received a recent blood work and reports his A1C went down! Ed will continue to work on his weight and watching his numbers.  He feels he has been control and a good plan going forward.  He plans to continue to work on weight loss with exercise and diet.     Expected Outcomes Short: Continue to work on weight loss Long; Continue to manage diabetes better. Short: Continue to check BG more often, focus on losing weight/speaking with doctor Long: Manage risk factors Continue to exercise and monitor risk factors.            Core Components/Risk Factors/Patient Goals at Discharge (Final Review):   Goals and Risk Factor Review - 10/26/19 1442      Core Components/Risk Factors/Patient Goals Review   Personal Goals Review Weight Management/Obesity;Hypertension;Diabetes;Lipids    Review Ed will continue to work on his weight and watching his numbers.  He feels he has been control and a good plan going forward.  He plans to continue to work on weight loss with exercise and diet.    Expected Outcomes Continue to exercise and monitor risk factors.           ITP Comments:  ITP Comments    Row Name 07/29/19 0632 08/26/19 0629 09/23/19 1053 10/21/19 0631 11/11/19 0827   ITP Comments 30 Day review completed. ITP review done, changes made as directed,and approval shown by signature of  Scientist, research (life sciences). 30 Day review completed. Medical Director ITP review done, changes made as directed, and signed approval by Medical Director. 30 Day review completed. Medical Director ITP review done, changes made as directed,  and signed approval by Medical Director. 30 Day review completed. Medical Director ITP review done, changes made as directed, and signed approval by Medical Director. Discharge ITP sent and signed by Dr. Sabra Heck.  Discharge Summary routed to PCP and cardiologist.          Comments: initial ITP

## 2020-01-13 ENCOUNTER — Encounter: Payer: Medicare HMO | Admitting: *Deleted

## 2020-01-13 ENCOUNTER — Other Ambulatory Visit: Payer: Self-pay

## 2020-01-13 ENCOUNTER — Encounter: Payer: Self-pay | Admitting: *Deleted

## 2020-01-13 DIAGNOSIS — I5022 Chronic systolic (congestive) heart failure: Secondary | ICD-10-CM

## 2020-01-13 NOTE — Progress Notes (Signed)
Cardiac Individual Treatment Plan  Patient Details  Name: Devin [name] [facility]. MRN: [id number] Date of Birth: Sep 22, 1945 Referring Provider:     Pulmonary Rehab from 01/12/2020 in [facility] Cardiac and Pulmonary Rehab  Referring Provider Humphrey Rolls      Initial Encounter Date:    Pulmonary Rehab from 01/12/2020 in [facility] Cardiac and Pulmonary Rehab  Date 01/12/20      Visit Diagnosis: Heart failure, chronic systolic (HCC)  Patient's Home Medications on Admission:  Current Outpatient Medications:  .  amiodarone (PACERONE) 400 MG tablet, Take 1 tablet (400 mg total) by mouth daily. (Patient not taking: Reported on 01/11/2020), Disp: 30 tablet, Rfl: 0 .  apixaban (ELIQUIS) 5 MG TABS tablet, Take 5 mg by mouth 2 (two) times daily. , Disp: , Rfl:  .  atorvastatin (LIPITOR) 80 MG tablet, Take 1 tablet (80 mg total) by mouth daily., Disp: 30 tablet, Rfl: 11 .  buPROPion (WELLBUTRIN XL) 300 MG 24 hr tablet, Take 300 mg by mouth daily. , Disp: , Rfl:  .  carvedilol (COREG) 25 MG tablet, Take 1 tablet (25 mg total) by mouth 2 (two) times daily., Disp: 60 tablet, Rfl: 11 .  ENTRESTO 49-51 MG, Take 1 tablet by mouth 2 (two) times daily., Disp: , Rfl:  .  EPINEPHrine 0.3 mg/0.3 mL IJ SOAJ injection, Inject 0.3 mg into the muscle as needed for anaphylaxis., Disp: , Rfl:  .  ezetimibe (ZETIA) 10 MG tablet, Take 10 mg by mouth daily., Disp: , Rfl:  .  furosemide (LASIX) 20 MG tablet, Take 1 tablet (20 mg total) by mouth daily. (Patient not taking: Reported on 01/11/2020), Disp: 30 tablet, Rfl: 11 .  gabapentin (NEURONTIN) 300 MG capsule, Take 300 mg by mouth 2 (two) times daily., Disp: , Rfl:  .  metFORMIN (GLUCOPHAGE) 500 MG tablet, Take 500 mg by mouth 2 (two) times daily., Disp: , Rfl:  .  nitroGLYCERIN (NITROSTAT) 0.4 MG SL tablet, Place 0.4 mg under the tongue every 5 (five) minutes as needed for chest pain., Disp: , Rfl:  .  sertraline (ZOLOFT) 25 MG tablet, Take 25 mg by mouth daily., Disp: , Rfl:  .   sildenafil (VIAGRA) 50 MG tablet, Take 50 mg by mouth daily as needed for erectile dysfunction., Disp: , Rfl:  No current facility-administered medications for this visit.  Facility-Administered Medications Ordered in Other Visits:  .  sodium chloride flush (NS) 0.9 % injection 3 mL, 3 mL, Intravenous, Q12H, Dionisio David, MD  Past Medical History: Past Medical History:  Diagnosis Date  . Arrhythmia    atrial fibrillation  . CAD (coronary artery disease)   . CHF (congestive heart failure) ([place])   . Diabetes mellitus without complication ([place])   . Hypertension   . MI, old   . Sleep apnea     Tobacco Use: Social History   Tobacco Use  Smoking Status Former Smoker  Smokeless Tobacco Never Used  Tobacco Comment   Quit over 40 years ago    Labs: Recent Review [facility] for [facility] Cardiac and Pulmonary Rehab Latest Ref Rng & Units 05/16/2019 12/06/2019   Hemoglobin A1c 4.8 - 5.6 % 5.8(H) 6.0(H)       Exercise Target Goals: Exercise Program Goal: Individual exercise prescription set using results from initial 6 min walk test and THRR while considering  patient's activity barriers and safety.   Exercise Prescription Goal: Initial exercise prescription builds to 30-45 minutes a day of aerobic activity, 2-3  days per week.  Home exercise guidelines will be given to patient during program as part of exercise prescription that the participant will acknowledge.   Education: Aerobic Exercise & Resistance Training: - Gives group verbal and written instruction on the various components of exercise. Focuses on aerobic and resistive training programs and the benefits of this training and how to safely progress through these programs..   Education: Exercise & Equipment Safety: - Individual verbal instruction and demonstration of equipment use and safety with use of the equipment.   Pulmonary Rehab from 01/12/2020 in [facility] Cardiac and Pulmonary Rehab  Date 01/12/20  Educator AS   Instruction Review Code 1- Verbalizes Understanding      Education: Exercise Physiology & General Exercise Guidelines: - Group verbal and written instruction with models to review the exercise physiology of the cardiovascular system and associated critical values. Provides general exercise guidelines with specific guidelines to those with heart or lung disease.    Cardiac Rehab from 10/07/2019 in [facility] Cardiac and Pulmonary Rehab  Date 09/23/19  Educator AS  Instruction Review Code 1- Verbalizes Understanding      Education: Flexibility, Balance, Mind/Body Relaxation: Provides group verbal/written instruction on the benefits of flexibility and balance training, including mind/body exercise modes such as yoga, pilates and tai chi.  Demonstration and skill practice provided.   Activity Barriers & Risk Stratification:   6 Minute Walk:  6 Minute Walk    Row Name 10/26/19 1419 01/12/20 1455       6 Minute Walk   Phase Discharge Initial    Distance -- 680 feet    Distance % Change 1.38 % --    Distance Feet Change 15 ft --    Walk Time 6 minutes 6 minutes    # of Rest Breaks 0 0    MPH 2.08 1.28    METS 2.25 1.3    RPE 11 13    Perceived Dyspnea  -- 1    VO2 Peak 7.87 4.57    Symptoms No No    Resting HR 80 bpm 61 bpm    Resting BP 130/64 136/60    Resting Oxygen Saturation  -- 95 %    Exercise Oxygen Saturation  during 6 min walk -- 94 %    Max Ex. HR 92 bpm 74 bpm    Max Ex. BP 142/72 124/66    2 Minute Post BP -- 126/66      Interval HR   1 Minute HR -- 65    2 Minute HR -- 74    3 Minute HR -- 69    4 Minute HR -- 67    5 Minute HR -- 66    6 Minute HR -- 68    2 Minute Post HR -- 64    Interval Heart Rate? -- Yes      Interval Oxygen   Interval Oxygen? -- Yes    Baseline Oxygen Saturation % -- 95 %    1 Minute Oxygen Saturation % -- 94 %    1 Minute Liters of Oxygen -- 0 L    2 Minute Oxygen Saturation % -- 95 %    2 Minute Liters of Oxygen -- 0 L    3  Minute Oxygen Saturation % -- 94 %    3 Minute Liters of Oxygen -- 0 L    4 Minute Oxygen Saturation % -- 95 %    4 Minute Liters of Oxygen -- 0 L  5 Minute Oxygen Saturation % -- 96 %    5 Minute Liters of Oxygen -- 0 L    6 Minute Oxygen Saturation % -- 96 %    6 Minute Liters of Oxygen -- 0 L    2 Minute Post Oxygen Saturation % -- 95 %    2 Minute Post Liters of Oxygen -- 0 L           Oxygen Initial Assessment:   Oxygen Re-Evaluation:   Oxygen Discharge (Final Oxygen Re-Evaluation):   Initial Exercise Prescription:  Initial Exercise Prescription - 01/12/20 1400      Date of Initial Exercise RX and Referring Provider   Date 01/12/20    Referring Provider Humphrey Rolls      Treadmill   MPH 1    Grade 0.5    Minutes 15    METs 1.83      Recumbant Bike   Level 1    Minutes 15    METs 1.5      NuStep   Level 1    SPM 80    Minutes 15    METs 1.5      Recumbant Elliptical   Level 1    RPM 50    Minutes 15    METs 1.5      REL-XR   Level 1    Watts 50    Minutes 15    METs 1.5      Biostep-RELP   Level 1    SPM 50    Minutes 15    METs 1.5      Prescription Details   Frequency (times per week) 3    Duration Progress to 30 minutes of continuous aerobic without signs/symptoms of physical distress      Intensity   THRR 40-80% of Max Heartrate 95-128    Ratings of Perceived Exertion 11-15    Perceived Dyspnea 0-4      Resistance Training   Training Prescription Yes    Weight 3 lb    Reps 10-15           Perform Capillary Blood Glucose checks as needed.  Exercise Prescription Changes:  Exercise Prescription Changes    Row Name 07/28/19 1300 08/11/19 1500 08/25/19 1400 09/08/19 1300 09/22/19 1300     Response to Exercise   Blood Pressure (Admit) 104/54 100/64 122/66 126/56 110/72   Blood Pressure (Exercise) 128/64 138/54 138/78 126/70 152/70   Blood Pressure (Exit) 112/62 94/64 122/64 96/54 104/66   Heart Rate (Admit) 70 bpm 68 bpm 84 bpm  69 bpm 71 bpm   Heart Rate (Exercise) 95 bpm 89 bpm 129 bpm 93 bpm 88 bpm   Heart Rate (Exit) 65 bpm 64 bpm 71 bpm 67 bpm 64 bpm   Rating of Perceived Exertion (Exercise) 17 17 14 17 15    Symptoms none none none fatigue on elliptical --   Duration Continue with 30 min of aerobic exercise without signs/symptoms of physical distress. Continue with 30 min of aerobic exercise without signs/symptoms of physical distress. Continue with 30 min of aerobic exercise without signs/symptoms of physical distress. Continue with 30 min of aerobic exercise without signs/symptoms of physical distress. Continue with 30 min of aerobic exercise without signs/symptoms of physical distress.   Intensity THRR unchanged THRR unchanged THRR unchanged THRR unchanged THRR unchanged     Progression   Progression Continue to progress workloads to maintain intensity without signs/symptoms of physical distress. Continue to progress workloads to maintain intensity  without signs/symptoms of physical distress. Continue to progress workloads to maintain intensity without signs/symptoms of physical distress. Continue to progress workloads to maintain intensity without signs/symptoms of physical distress. Continue to progress workloads to maintain intensity without signs/symptoms of physical distress.   Average METs 2.3 2.42 3.85 3.13 2.75     Resistance Training   Training Prescription Yes Yes Yes Yes Yes   Weight 4 lb 4 lb 5 lb 5 lb 5 lb   Reps 10-15 10-15 10-15 10-15 10-15     Interval Training   Interval Training No No No No No     Treadmill   MPH 2 2 -- 2.4 2.6   Grade 0.5 0.5 -- 0.5 0.5   Minutes 15 15 -- 15 15   METs 2.67 2.67 -- 3 3.17     Recumbant Bike   Level -- 4 -- -- --   Minutes -- 15 -- -- --   METs -- 2.3 -- -- --     NuStep   Level -- 4 4 -- 5   SPM -- -- 80 -- 80   Minutes -- 15 15 -- 15   METs -- 2.3 4 -- 2.3     Elliptical   Level 1 -- -- 1 --   Speed 2 -- -- 2 --   Minutes 15 -- -- 15 --      REL-XR   Level -- 1 -- 5 --   Minutes -- 15 -- 15 --   METs -- -- -- 4.1 --     T5 Nustep   Level -- 3 -- 3 --   Minutes -- 15 -- 15 --   METs -- 2 -- 2.4 --     Biostep-RELP   Level -- -- 4 -- --   SPM -- -- 50 -- --   Minutes -- -- 15 -- --   METs -- -- 3 -- --     Home Exercise Plan   Plans to continue exercise at -- Home (comment)  walking Home (comment)  walking Home (comment)  walking Home (comment)  walking   Frequency -- Add 2 additional days to program exercise sessions. Add 2 additional days to program exercise sessions. Add 2 additional days to program exercise sessions. Add 2 additional days to program exercise sessions.   Initial Home Exercises Provided -- 08/03/19 08/03/19 08/03/19 08/03/19   Row Name 10/06/19 1300 10/20/19 1300 11/03/19 1300 01/12/20 1500       Response to Exercise   Blood Pressure (Admit) 110/60 146/74 118/62 136/60    Blood Pressure (Exercise) 180/88 162/76 142/76 124/66    Blood Pressure (Exit) 118/70 128/74 100/60 126/66    Heart Rate (Admit) 70 bpm 75 bpm 70 bpm 61 bpm    Heart Rate (Exercise) 90 bpm 90 bpm 109 bpm 74 bpm    Heart Rate (Exit) 69 bpm 73 bpm 103 bpm 64 bpm    Rating of Perceived Exertion (Exercise) 17 15 13 13     Perceived Dyspnea (Exercise) -- -- -- 1    Symptoms none none none --    Duration Continue with 30 min of aerobic exercise without signs/symptoms of physical distress. Continue with 30 min of aerobic exercise without signs/symptoms of physical distress. Continue with 30 min of aerobic exercise without signs/symptoms of physical distress. --    Intensity THRR unchanged THRR unchanged THRR unchanged --      Progression   Progression Continue to progress workloads to maintain intensity without  signs/symptoms of physical distress. Continue to progress workloads to maintain intensity without signs/symptoms of physical distress. Continue to progress workloads to maintain intensity without signs/symptoms of physical  distress. --    Average METs 3.29 3 3.53 --      Resistance Training   Training Prescription Yes Yes Yes --    Weight 5 lb 5 lb 5 lb --    Reps 10-15 10-15 10-15 --      Interval Training   Interval Training No No No --      Treadmill   MPH 2.6 2.3 2.3 --    Grade 0.5 0.5 0 --    Minutes $Remove'15 15 15 'ZYoCQUQ$ --    METs 3.17 2.92 2.76 --      REL-XR   Level $Remo'5 6 6 'hBfMS$ --    Watts -- 50 -- --    Minutes $Remove'15 15 15 'gjLNrPJ$ --    METs 3.3 3 4.3 --      T5 Nustep   Level 5 -- -- --    Minutes 15 -- -- --    METs 3.4 -- -- --      Home Exercise Plan   Plans to continue exercise at Home (comment)  walking Home (comment)  walking Home (comment)  walking --    Frequency Add 2 additional days to program exercise sessions. Add 2 additional days to program exercise sessions. Add 2 additional days to program exercise sessions. --    Initial Home Exercises Provided 08/03/19 08/03/19 08/03/19 --           Exercise Comments:   Exercise Goals and Review:  Exercise Goals    Row Name 01/12/20 1501             Exercise Goals   Increase Physical Activity Yes       Intervention Provide advice, education, support and counseling about physical activity/exercise needs.;Develop an individualized exercise prescription for aerobic and resistive training based on initial evaluation findings, risk stratification, comorbidities and participant's personal goals.       Expected Outcomes Long Term: Add in home exercise to make exercise part of routine and to increase amount of physical activity.;Long Term: Exercising regularly at least 3-5 days a week.;Short Term: Attend rehab on a regular basis to increase amount of physical activity.       Intervention Provide advice, education, support and counseling about physical activity/exercise needs.;Develop an individualized exercise prescription for aerobic and resistive training based on initial evaluation findings, risk stratification, comorbidities and participant's personal  goals.       Expected Outcomes Short Term: Increase workloads from initial exercise prescription for resistance, speed, and METs.;Short Term: Perform resistance training exercises routinely during rehab and add in resistance training at home;Long Term: Improve cardiorespiratory fitness, muscular endurance and strength as measured by increased METs and functional capacity (6MWT)       Able to understand and use rate of perceived exertion (RPE) scale Yes       Intervention Provide education and explanation on how to use RPE scale       Expected Outcomes Short Term: Able to use RPE daily in rehab to express subjective intensity level;Long Term:  Able to use RPE to guide intensity level when exercising independently       Able to understand and use Dyspnea scale Yes       Intervention Provide education and explanation on how to use Dyspnea scale       Expected Outcomes Short Term: Able to  use Dyspnea scale daily in rehab to express subjective sense of shortness of breath during exertion;Long Term: Able to use Dyspnea scale to guide intensity level when exercising independently       Knowledge and understanding of Target Heart Rate Range (THRR) Yes       Intervention Provide education and explanation of THRR including how the numbers were predicted and where they are located for reference       Expected Outcomes Short Term: Able to state/look up THRR;Short Term: Able to use daily as guideline for intensity in rehab;Long Term: Able to use THRR to govern intensity when exercising independently       Able to check pulse independently Yes       Intervention Provide education and demonstration on how to check pulse in carotid and radial arteries.;Review the importance of being able to check your own pulse for safety during independent exercise       Expected Outcomes Short Term: Able to explain why pulse checking is important during independent exercise;Long Term: Able to check pulse independently and accurately        Intervention Provide education, explanation, and written materials on patient's individual exercise prescription       Expected Outcomes Short Term: Able to explain program exercise prescription;Long Term: Able to explain home exercise prescription to exercise independently              Exercise Goals Re-Evaluation :  Exercise Goals Re-Evaluation    Row Name 07/28/19 1339 08/05/19 1409 08/11/19 1543 08/19/19 1345 08/25/19 1456     Exercise Goal Re-Evaluation   Exercise Goals Review Increase Physical Activity;Increase Strength and Stamina;Understanding of Exercise Prescription Increase Physical Activity;Increase Strength and Stamina;Understanding of Exercise Prescription;Able to understand and use rate of perceived exertion (RPE) scale;Knowledge and understanding of Target Heart Rate Range (THRR);Able to check pulse independently Increase Physical Activity;Increase Strength and Stamina;Understanding of Exercise Prescription Increase Physical Activity;Increase Strength and Stamina;Understanding of Exercise Prescription Increase Physical Activity;Increase Strength and Stamina;Understanding of Exercise Prescription   Comments Ed is up to 6 min on the ellipitical.  Staff will have him progress slowly to 15 min. Reviewed home exercise with pt today.  Pt plans to walk at home and use [employer] for exercise.  Reviewed THR, pulse, RPE, sign and symptoms, NTG use, and when to call 911 or MD.  Also discussed weather considerations and indoor options.  Pt voiced understanding. Ed has been doing well in rehab.  He is up to level 4 on the NuStep and has asked to switch off the elliptical as it is too hard for him.  We have rearranged his equipment.  We will continue to montior his progress. Ed has been doing well in rehab.  He is walking with his dog at home a 1/4 mile each day.  We talked about extending this time out some.  He does note he has some more stamina overall. Ed is making steady progress.  He has  increased to 2.4 mph on TM and 5 lb weights for strength.   Expected Outcomes Short: work on extending time on elliptical Long:  improve overall stamina Short: add one day in adition to program sessions Long: exercise on his own Short: Try out new equipment rotation  Long: Continue to improve stamina Short: Walk longer at home Long; Continue to improve stamina. Short: continue to increase workloads Long :  improve MET level   Row Name 09/08/19 1329 09/09/19 1338 09/22/19 1320 10/06/19 1353 10/20/19 1353  Exercise Goal Re-Evaluation   Exercise Goals Review Increase Physical Activity;Increase Strength and Stamina;Understanding of Exercise Prescription Increase Physical Activity;Increase Strength and Stamina;Understanding of Exercise Prescription Increase Physical Activity;Increase Strength and Stamina;Understanding of Exercise Prescription Increase Physical Activity;Increase Strength and Stamina;Understanding of Exercise Prescription Increase Physical Activity;Increase Strength and Stamina;Understanding of Exercise Prescription   Comments Ed continues to do well in rehab.  He has decided to drop back to twice a week due to his schedule.  He was able to get back on the ellipitcal for the first time yesterday and actually able to do 12 min!!!  We will continue to monitor his progress. Ed is doing well and continuing to walk on his off days. He is walking an extra day outside of rehab, he is interested in  adding more days on to his schedule. He walks for about 1 hour and plans to walk in the mall on days that are too hot. Ed is making steady progress.  he has increase to 2.6 mph on TM. Ed has been doing well in rehab.  He is now up to level 5 on the T5 NuStep and XR.  We will continue to monitor his progress. Ed continues to do well with exercise.  He is now on level 6 for XR and NS.   Expected Outcomes Short: Continue to improve stamina on the elliptical Long: Continue to improve stamina. Short: Add more  walking to weekly routines Long: Continue to increase strength/endurace, progress MET level Short: attend consistently Long:  increase stamina Short: Continue to increase workloads Long; Continue to improve stamina. Short: Continue to increase workloads Long; Continue to improve stamina.   [place] Name 10/26/19 1435 11/03/19 1354           Exercise Goal Re-Evaluation   Exercise Goals Review Increase Physical Activity;Increase Strength and Stamina;Understanding of Exercise Prescription Increase Physical Activity;Increase Strength and Stamina;Understanding of Exercise Prescription      Comments Ed is set to graduate next week.  He improved his post 6MWT some. He is planning to continue to exericse at [employer] in [place] using his [place]. Ed was out on vacation this week.  He will graduate next week and go to [employer].      Expected Outcomes Continue to exercise independently. Continue to exercise independently.             Discharge Exercise Prescription (Final Exercise Prescription Changes):  Exercise Prescription Changes - 01/12/20 1500      Response to Exercise   Blood Pressure (Admit) 136/60    Blood Pressure (Exercise) 124/66    Blood Pressure (Exit) 126/66    Heart Rate (Admit) 61 bpm    Heart Rate (Exercise) 74 bpm    Heart Rate (Exit) 64 bpm    Rating of Perceived Exertion (Exercise) 13    Perceived Dyspnea (Exercise) 1           Nutrition:  Target Goals: Understanding of nutrition guidelines, daily intake of sodium '1500mg'$ , cholesterol '200mg'$ , calories 30% from fat and 7% or less from saturated fats, daily to have 5 or more servings of fruits and vegetables.  Education: Controlling Sodium/Reading Food Labels -Group verbal and written material supporting the discussion of sodium use in heart healthy nutrition. Review and explanation with models, verbal and written materials for utilization of the food label.   Education: General Nutrition  Guidelines/Fats and Fiber: -Group instruction provided by verbal, written material, models and posters to present the general guidelines for heart healthy nutrition. Gives  an explanation and review of dietary fats and fiber.   Biometrics:  Pre Biometrics - 01/12/20 1502      Pre Biometrics   Height $Remov'5\' 11"'NfnbCZ$  (1.803 m)    Weight 199 lb 11.2 oz (90.6 kg)    BMI (Calculated) 27.86    Single Leg Stand 0 seconds           Post Biometrics - 10/26/19 1420       Post  Biometrics   Height 5' 11.1" (1.806 m)    Weight 208 lb (94.3 kg)    BMI (Calculated) 28.93    Single Leg Stand 3.26 seconds           Nutrition Therapy Plan and Nutrition Goals:   Nutrition Assessments:  Nutrition Assessments - 01/12/20 1505      MEDFICTS Scores   Pre Score 13           MEDIFICTS Score Key:          ?70 Need to make dietary changes          40-70 Heart Healthy Diet         ? 40 Therapeutic Level Cholesterol Diet  Nutrition Goals Re-Evaluation:  Nutrition Goals Re-Evaluation    [place] Name 09/09/19 1355 09/28/19 1415 10/26/19 1441         Goals   Nutrition Goal ST: substantial snack before and after exercise (bean salad) and bring applesauce to rehab LT: Eat for heart and diabetes ST: substantial snack before and after exercise (bean salad) and bring applesauce to rehab, try to add greens to every dinner LT: Eat for heart and diabetes Better snacks and add more greens     Comment Continue to follow RD changes and recommendations Discussed how to add more greens to meals as pt reports struggling with this. Ed is planning to continue to follow changes that he has already made.  He is working on snacking and adding in greens.     Expected Outcome Short: Follow regular changes Long: Maintain heart healthy diet ST: substantial snack before and after exercise (bean salad) and bring applesauce to rehab, try to add greens to every dinner Continue to follow heart healthy changes already made.             Nutrition Goals Discharge (Final Nutrition Goals Re-Evaluation):  Nutrition Goals Re-Evaluation - 10/26/19 1441      Goals   Nutrition Goal Better snacks and add more greens    Comment Ed is planning to continue to follow changes that he has already made.  He is working on snacking and adding in greens.    Expected Outcome Continue to follow heart healthy changes already made.           Psychosocial: Target Goals: Acknowledge presence or absence of significant depression and/or stress, maximize coping skills, provide positive support system. Participant is able to verbalize types and ability to use techniques and skills needed for reducing stress and depression.   Education: Depression - Provides group verbal and written instruction on the correlation between heart/lung disease and depressed mood, treatment options, and the stigmas associated with seeking treatment.   Education: Sleep Hygiene -Provides group verbal and written instruction about how sleep can affect your health.  Define sleep hygiene, discuss sleep cycles and impact of sleep habits. Review good sleep hygiene tips.     Education: Stress and Anxiety: - Provides group verbal and written instruction about the health risks of elevated stress and causes of high  stress.  Discuss the correlation between heart/lung disease and anxiety and treatment options. Review healthy ways to manage with stress and anxiety.    Initial Review & Psychosocial Screening:   Quality of Life Scores:   Quality of Life - 10/28/19 1424      Quality of Life Scores   Health/Function Pre 26.57 %    Health/Function Post 24.23 %    Health/Function % Change -8.81 %    Socioeconomic Pre 22.63 %    Socioeconomic Post 20 %    Socioeconomic % Change  -11.62 %    Psych/Spiritual Pre 29.14 %    Psych/Spiritual Post 24.2 %    Psych/Spiritual % Change -16.95 %    Family Pre 17.67 %    Family Post 13 %    Family % Change -26.43 %    GLOBAL Pre  25.72 %    GLOBAL Post 23.12 %    GLOBAL % Change -10.11 %          Scores of 19 and below usually indicate a poorer quality of life in these areas.  A difference of  2-3 points is a clinically meaningful difference.  A difference of 2-3 points in the total score of the Quality of Life Index has been associated with significant improvement in overall quality of life, self-image, physical symptoms, and general health in studies assessing change in quality of life.  PHQ-9: Recent Review Flowsheet Data    Depression screen [facility] 2/9 01/12/2020 10/28/2019 09/09/2019 07/02/2019   Decreased Interest 3 2 1 2    Down, Depressed, Hopeless 0 0 0 1   PHQ - 2 Score 3 2 1 3    Altered sleeping 1  3 3 3    Tired, decreased energy 2 2 0 2   Change in appetite 1  1 0 1   Feeling bad or failure about yourself  0 0 0 0   Trouble concentrating 0 1 0 0   Moving slowly or fidgety/restless 0 1 0 0   Suicidal thoughts 1  0 0 0   PHQ-9 Score 8 10 4 9    Difficult doing work/chores Not difficult at all Somewhat difficult Not difficult at all Not difficult at all     Interpretation of Total Score  Total Score Depression Severity:  1-4 = Minimal depression, 5-9 = Mild depression, 10-14 = Moderate depression, 15-19 = Moderately severe depression, 20-27 = Severe depression   Psychosocial Evaluation and Intervention:  Psychosocial Evaluation - 10/26/19 1443      Discharge Psychosocial Assessment & Intervention   Comments Ed will be graduating next week.  He has enjoyed the program and getting  back into the routine of exericse.  He enjoyed getting to know the staff as well.  He plans to continue to exercise by going to [employer] in [place] and will contineu to keep eye on his risk factors.  He has learned some use tools and tips in the program.           Psychosocial Re-Evaluation:  Psychosocial Re-Evaluation    [place] Name 08/19/19 1349 09/09/19 1353           Psychosocial Re-Evaluation   Current issues  with Current Sleep Concerns;Current Stress Concerns Current Sleep Concerns;Current Stress Concerns      Comments Ed is doing better mentally.  He is sleeping better now.  He enjoys getting out to meet people and coming to class. Ed reports he doing well mentally. He is not sleeping to well  due to his sleep apnea but thinks it has been better since the beginning. Denies any big stressors in his life. Stays compliant with antidepressants and reports making him feel well. PHQ imoroved by 5 points since last time!      Expected Outcomes Short: Continue to attend rehab for mental boost Long; Continue to stay positive Short: Focus on maintaining regular sleep patterns, continue to exercise for mental health Long: Maintain positive attitude      Interventions Encouraged to attend Cardiac Rehabilitation for the exercise Encouraged to attend Cardiac Rehabilitation for the exercise      Continue Psychosocial Services  Follow up required by staff Follow up required by staff             Psychosocial Discharge (Final Psychosocial Re-Evaluation):  Psychosocial Re-Evaluation - 09/09/19 1353      Psychosocial Re-Evaluation   Current issues with Current Sleep Concerns;Current Stress Concerns    Comments Ed reports he doing well mentally. He is not sleeping to well due to his sleep apnea but thinks it has been better since the beginning. Denies any big stressors in his life. Stays compliant with antidepressants and reports making him feel well. PHQ imoroved by 5 points since last time!    Expected Outcomes Short: Focus on maintaining regular sleep patterns, continue to exercise for mental health Long: Maintain positive attitude    Interventions Encouraged to attend Cardiac Rehabilitation for the exercise    Continue Psychosocial Services  Follow up required by staff           Vocational Rehabilitation: Provide vocational rehab assistance to qualifying candidates.   Vocational Rehab Evaluation &  Intervention:   Education: Education Goals: Education classes will be provided on a variety of topics geared toward better understanding of heart health and risk factor modification. Participant will state understanding/return demonstration of topics presented as noted by education test scores.  Learning Barriers/Preferences:   General Cardiac Education Topics:  AED/CPR: - Group verbal and written instruction with the use of models to demonstrate the basic use of the AED with the basic ABC's of resuscitation.   Anatomy & Physiology of the Heart: - Group verbal and written instruction and models provide basic cardiac anatomy and physiology, with the coronary electrical and arterial systems. Review of Valvular disease and Heart Failure   Cardiac Procedures: - Group verbal and written instruction to review commonly prescribed medications for heart disease. Reviews the medication, class of the drug, and side effects. Includes the steps to properly store meds and maintain the prescription regimen. (beta blockers and nitrates)   Cardiac Medications I: - Group verbal and written instruction to review commonly prescribed medications for heart disease. Reviews the medication, class of the drug, and side effects. Includes the steps to properly store meds and maintain the prescription regimen.   Cardiac Rehab from 10/07/2019 in [facility] Cardiac and Pulmonary Rehab  Date 10/07/19  Educator SB  Instruction Review Code 1- Verbalizes Understanding      Cardiac Medications II: -Group verbal and written instruction to review commonly prescribed medications for heart disease. Reviews the medication, class of the drug, and side effects. (all other drug classes)    Go Sex-Intimacy & Heart Disease, Get SMART - Goal Setting: - Group verbal and written instruction through game format to discuss heart disease and the return to sexual intimacy. Provides group verbal and written material to discuss and apply  goal setting through the application of the S.M.A.R.T. Method.   Other Matters of the  Heart: - Provides group verbal, written materials and models to describe Stable Angina and Peripheral Artery. Includes description of the disease process and treatment options available to the cardiac patient.   Infection Prevention: - Provides verbal and written material to individual with discussion of infection control including proper hand washing and proper equipment cleaning during exercise session.   Pulmonary Rehab from 01/12/2020 in [facility] Cardiac and Pulmonary Rehab  Date 01/12/20  Educator AS  Instruction Review Code 1- Verbalizes Understanding      Falls Prevention: - Provides verbal and written material to individual with discussion of falls prevention and safety.   Pulmonary Rehab from 01/12/2020 in [facility] Cardiac and Pulmonary Rehab  Date 01/12/20  Educator AS  Instruction Review Code 1- Verbalizes Understanding      Other: -Provides group and verbal instruction on various topics (see comments)   Knowledge Questionnaire Score:  Knowledge Questionnaire Score - 01/12/20 1503      Knowledge Questionnaire Score   Pre Score 13/18 oxygen           Core Components/Risk Factors/Patient Goals at Admission:   Education:Diabetes - Individual verbal and written instruction to review signs/symptoms of diabetes, desired ranges of glucose level fasting, after meals and with exercise. Acknowledge that pre and post exercise glucose checks will be done for 3 sessions at entry of program.   Education: Know Your Numbers and Risk Factors: -Group verbal and written instruction about important numbers in your health.  Discussion of what are risk factors and how they play a role in the disease process.  Review of Cholesterol, Blood Pressure, Diabetes, and BMI and the role they play in your overall health.   Core Components/Risk Factors/Patient Goals Review:   Goals and Risk Factor Review    Row  Name 08/19/19 1350 09/09/19 1343 10/26/19 1442         Core Components/Risk Factors/Patient Goals Review   Personal Goals Review Weight Management/Obesity;Hypertension;Diabetes;Lipids Weight Management/Obesity;Hypertension;Diabetes;Lipids Weight Management/Obesity;Hypertension;Diabetes;Lipids     Review Ed is doing well in rehab.  His weight is trending down.  He keeps a good eye on his pressures and sugars.  His pressures have continued to do well.  When he first started, his sugars were dropping with exercise, now he has made changes and is staying stable. Ed would like to lose weight but is struggling to do so as he said his bowel movements are not regular, he plans to talk to his doctor about it at his upcoming appointment. Reports that he takes his blood sugars at home but does not do it as much as he would like to, reports normal ranges at home. He will plan to check more often and spoke about setting up alarms to remind him. Ed states his BPs have been stable here at rehab. He received a recent blood work and reports his A1C went down! Ed will continue to work on his weight and watching his numbers.  He feels he has been control and a good plan going forward.  He plans to continue to work on weight loss with exercise and diet.     Expected Outcomes Short: Continue to work on weight loss Long; Continue to manage diabetes better. Short: Continue to check BG more often, focus on losing weight/speaking with doctor Long: Manage risk factors Continue to exercise and monitor risk factors.            Core Components/Risk Factors/Patient Goals at Discharge (Final Review):   Goals and Risk Factor Review - 10/26/19  1442      Core Components/Risk Factors/Patient Goals Review   Personal Goals Review Weight Management/Obesity;Hypertension;Diabetes;Lipids    Review Ed will continue to work on his weight and watching his numbers.  He feels he has been control and a good plan going forward.  He plans to  continue to work on weight loss with exercise and diet.    Expected Outcomes Continue to exercise and monitor risk factors.           ITP Comments:  ITP Comments    Row Name 07/29/19 0632 08/26/19 0629 09/23/19 1053 10/21/19 0631 11/11/19 0827   ITP Comments 30 Day review completed. ITP review done, changes made as directed,and approval shown by signature of  Scientist, research (life sciences). 30 Day review completed. Medical Director ITP review done, changes made as directed, and signed approval by Medical Director. 30 Day review completed. Medical Director ITP review done, changes made as directed, and signed approval by Medical Director. 30 Day review completed. Medical Director ITP review done, changes made as directed, and signed approval by Medical Director. Discharge ITP sent and signed by Dr. Sabra Heck.  Discharge Summary routed to PCP and cardiologist.   Row Name 01/13/20 0705           ITP Comments 30 Day review completed. Medical Director ITP review done, changes made as directed, and signed approval by Medical Director.              Comments:

## 2020-01-13 NOTE — Progress Notes (Signed)
Initial orientation completed. Devin [name] has already completed the exercise component. He will start pulmonary rehab 11/8.

## 2020-01-13 NOTE — Patient Instructions (Signed)
Patient Instructions  Patient Details  Name: Devin [name] [facility]. MRN: [id number] Date of Birth: [birth year] Referring Provider:  Laurier Nancy, MD  Below are your personal goals for exercise, nutrition, and risk factors. Our goal is to help you stay on track towards obtaining and maintaining these goals. We will be discussing your progress on these goals with you throughout the program.  Initial Exercise Prescription:  Initial Exercise Prescription - 01/12/20 1400      Date of Initial Exercise RX and Referring Provider   Date 01/12/20    Referring Provider Welton Flakes      Treadmill   MPH 1    Grade 0.5    Minutes 15    METs 1.83      Recumbant Bike   Level 1    Minutes 15    METs 1.5      NuStep   Level 1    SPM 80    Minutes 15    METs 1.5      Recumbant Elliptical   Level 1    RPM 50    Minutes 15    METs 1.5      REL-XR   Level 1    Watts 50    Minutes 15    METs 1.5      Biostep-RELP   Level 1    SPM 50    Minutes 15    METs 1.5      Prescription Details   Frequency (times per week) 3    Duration Progress to 30 minutes of continuous aerobic without signs/symptoms of physical distress      Intensity   THRR 40-80% of Max Heartrate 95-128    Ratings of Perceived Exertion 11-15    Perceived Dyspnea 0-4      Resistance Training   Training Prescription Yes    Weight 3 lb    Reps 10-15           Exercise Goals: Frequency: Be able to perform aerobic exercise two to three times per week in program working toward 2-5 days per week of home exercise.  Intensity: Work with a perceived exertion of 11 (fairly light) - 15 (hard) while following your exercise prescription.  We will make changes to your prescription with you as you progress through the program.   Duration: Be able to do 30 to 45 minutes of continuous aerobic exercise in addition to a 5 minute warm-up and a 5 minute cool-down routine.   Nutrition Goals: Your personal nutrition goals will be  established when you do your nutrition analysis with the dietician.  The following are general nutrition guidelines to follow: Cholesterol < 200mg /day Sodium < 1500mg /day Fiber: Men over 50 yrs - 30 grams per day  Personal Goals:  Personal Goals and Risk Factors at Admission - 01/13/20 1405      Core Components/Risk Factors/Patient Goals on Admission    Weight Management Yes;Weight Loss    Intervention Weight Management: Develop a combined nutrition and exercise program designed to reach desired caloric intake, while maintaining appropriate intake of nutrient and fiber, sodium and fats, and appropriate energy expenditure required for the weight goal.;Weight Management: Provide education and appropriate resources to help participant work on and attain dietary goals.    Expected Outcomes Short Term: Continue to assess and modify interventions until short term weight is achieved;Long Term: Adherence to nutrition and physical activity/exercise program aimed toward attainment of established weight goal;Weight Loss: Understanding of general recommendations for a balanced  deficit meal plan, which promotes 1-2 lb weight loss per week and includes a negative energy balance of [id number] kcal/d;Understanding recommendations for meals to include 15-35% energy as protein, 25-35% energy from fat, 35-60% energy from carbohydrates, less than 200mg  of dietary cholesterol, 20-35 gm of total fiber daily;Understanding of distribution of calorie intake throughout the day with the consumption of 4-5 meals/snacks    Diabetes Yes    Intervention Provide education about signs/symptoms and action to take for hypo/hyperglycemia.;Provide education about proper nutrition, including hydration, and aerobic/resistive exercise prescription along with prescribed medications to achieve blood glucose in normal ranges: Fasting glucose 65-99 mg/dL    Expected Outcomes Short Term: Participant verbalizes understanding of the signs/symptoms  and immediate care of hyper/hypoglycemia, proper foot care and importance of medication, aerobic/resistive exercise and nutrition plan for blood glucose control.;Long Term: Attainment of HbA1C < 7%.    Heart Failure Yes    Intervention Provide a combined exercise and nutrition program that is supplemented with education, support and counseling about heart failure. Directed toward relieving symptoms such as shortness of breath, decreased exercise tolerance, and extremity edema.    Expected Outcomes Improve functional capacity of life;Short term: Attendance in program 2-3 days a week with increased exercise capacity. Reported lower sodium intake. Reported increased fruit and vegetable intake. Reports medication compliance.;Short term: Daily weights obtained and reported for increase. Utilizing diuretic protocols set by physician.;Long term: Adoption of self-care skills and reduction of barriers for early signs and symptoms recognition and intervention leading to self-care maintenance.    Hypertension Yes    Intervention Provide education on lifestyle modifcations including regular physical activity/exercise, weight management, moderate sodium restriction and increased consumption of fresh fruit, vegetables, and low fat dairy, alcohol moderation, and smoking cessation.;Monitor prescription use compliance.    Expected Outcomes Short Term: Continued assessment and intervention until BP is < 140/42mm HG in hypertensive participants. < 130/79mm HG in hypertensive participants with diabetes, heart failure or chronic kidney disease.;Long Term: Maintenance of blood pressure at goal levels.    Lipids Yes    Intervention Provide education and support for participant on nutrition & aerobic/resistive exercise along with prescribed medications to achieve LDL 70mg , HDL >40mg .    Expected Outcomes Short Term: Participant states understanding of desired cholesterol values and is compliant with medications prescribed.  Participant is following exercise prescription and nutrition guidelines.;Long Term: Cholesterol controlled with medications as prescribed, with individualized exercise RX and with personalized nutrition plan. Value goals: LDL < 70mg , HDL > 40 mg.           Tobacco Use Initial Evaluation: Social History   Tobacco Use  Smoking Status Former Smoker  Smokeless Tobacco Never Used  Tobacco Comment   Quit over 40 years ago    Exercise Goals and Review:  Exercise Goals    Row Name 01/12/20 1501             Exercise Goals   Increase Physical Activity Yes       Intervention Provide advice, education, support and counseling about physical activity/exercise needs.;Develop an individualized exercise prescription for aerobic and resistive training based on initial evaluation findings, risk stratification, comorbidities and participant's personal goals.       Expected Outcomes Long Term: Add in home exercise to make exercise part of routine and to increase amount of physical activity.;Long Term: Exercising regularly at least 3-5 days a week.;Short Term: Attend rehab on a regular basis to increase amount of physical activity.       Intervention  Provide advice, education, support and counseling about physical activity/exercise needs.;Develop an individualized exercise prescription for aerobic and resistive training based on initial evaluation findings, risk stratification, comorbidities and participant's personal goals.       Expected Outcomes Short Term: Increase workloads from initial exercise prescription for resistance, speed, and METs.;Short Term: Perform resistance training exercises routinely during rehab and add in resistance training at home;Long Term: Improve cardiorespiratory fitness, muscular endurance and strength as measured by increased METs and functional capacity ( )       Able to understand and use rate of perceived exertion (RPE) scale Yes       Intervention Provide education and  explanation on how to use RPE scale       Expected Outcomes Short Term: Able to use RPE daily in rehab to express subjective intensity level;Long Term:  Able to use RPE to guide intensity level when exercising independently       Able to understand and use Dyspnea scale Yes       Intervention Provide education and explanation on how to use Dyspnea scale       Expected Outcomes Short Term: Able to use Dyspnea scale daily in rehab to express subjective sense of shortness of breath during exertion;Long Term: Able to use Dyspnea scale to guide intensity level when exercising independently       Knowledge and understanding of Target Heart Rate Range (THRR) Yes       Intervention Provide education and explanation of THRR including how the numbers were predicted and where they are located for reference       Expected Outcomes Short Term: Able to state/look up THRR;Short Term: Able to use daily as guideline for intensity in rehab;Long Term: Able to use THRR to govern intensity when exercising independently       Able to check pulse independently Yes       Intervention Provide education and demonstration on how to check pulse in carotid and radial arteries.;Review the importance of being able to check your own pulse for safety during independent exercise       Expected Outcomes Short Term: Able to explain why pulse checking is important during independent exercise;Long Term: Able to check pulse independently and accurately       Intervention Provide education, explanation, and written materials on patient's individual exercise prescription       Expected Outcomes Short Term: Able to explain program exercise prescription;Long Term: Able to explain home exercise prescription to exercise independently              Copy of goals given to participant.

## 2020-01-18 ENCOUNTER — Other Ambulatory Visit: Payer: Self-pay

## 2020-01-18 DIAGNOSIS — Z951 Presence of aortocoronary bypass graft: Secondary | ICD-10-CM

## 2020-01-18 DIAGNOSIS — I5022 Chronic systolic (congestive) heart failure: Secondary | ICD-10-CM

## 2020-01-18 NOTE — Progress Notes (Signed)
Daily Session Note  Patient Details  Name: Devin [name] [facility]. MRN: [id number] Date of Birth: 1946/02/13 Referring Provider:     Pulmonary Rehab from 01/12/2020 in [facility] Cardiac and Pulmonary Rehab  Referring Provider Humphrey Rolls      Encounter Date: 01/18/2020  Check In:  Session Check In - 01/18/20 1630      Check-In   Supervising physician immediately available to respond to emergencies See telemetry face sheet for immediately available ER MD    Location [facility]-Cardiac & Pulmonary Rehab    Staff Present Renita Papa, RN Sherryl Barters, MPA, Mauricia Area, BS, ACSM CEP, Exercise Physiologist;Amanda Oletta Darter, BA, ACSM CEP, Exercise Physiologist    Virtual Visit No    Medication changes reported     No    Fall or balance concerns reported    No    Warm-up and Cool-down Performed on first and last piece of equipment    Resistance Training Performed Yes    VAD Patient? No    PAD/SET Patient? No      Pain Assessment   Currently in Pain? No/denies              Social History   Tobacco Use  Smoking Status Former Smoker  Smokeless Tobacco Never Used  Tobacco Comment   Quit over 40 years ago    Goals Met:  Independence with exercise equipment Exercise tolerated well No report of cardiac concerns or symptoms Strength training completed today  Goals Unmet:  Not Applicable  Comments: First full day of exercise!  Patient was oriented to gym and equipment including functions, settings, policies, and procedures.  Patient's individual exercise prescription and treatment plan were reviewed.  All starting workloads were established based on the results of the 6 minute walk test done at initial orientation visit.  The plan for exercise progression was also introduced and progression will be customized based on patient's performance and goals.    Dr. Emily Filbert is Medical Director for [facility] and [facility].

## 2020-01-20 ENCOUNTER — Other Ambulatory Visit: Payer: Self-pay

## 2020-01-20 DIAGNOSIS — Z951 Presence of aortocoronary bypass graft: Secondary | ICD-10-CM

## 2020-01-20 DIAGNOSIS — I5022 Chronic systolic (congestive) heart failure: Secondary | ICD-10-CM

## 2020-01-20 NOTE — Progress Notes (Signed)
Daily Session Note  Patient Details  Name: Javanni Maring [facility]. MRN: [id number] Date of Birth: 1945-04-13 Referring Provider:     Pulmonary Rehab from 01/12/2020 in [facility] Cardiac and Pulmonary Rehab  Referring Provider Humphrey Rolls      Encounter Date: 01/20/2020  Check In:  Session Check In - 01/20/20 1612      Check-In   Supervising physician immediately available to respond to emergencies See telemetry face sheet for immediately available ER MD    Location [facility]-Cardiac & Pulmonary Rehab    Staff Present Birdie Sons, MPA, Elveria Rising, BA, ACSM CEP, Exercise Physiologist;Kara Eliezer Bottom, MS Exercise Physiologist;Meredith Sherryll Burger, RN BSN    Virtual Visit No    Medication changes reported     No    Fall or balance concerns reported    No    Warm-up and Cool-down Performed on first and last piece of equipment    Resistance Training Performed Yes    VAD Patient? No    PAD/SET Patient? No      Pain Assessment   Currently in Pain? No/denies              Social History   Tobacco Use  Smoking Status Former Smoker  Smokeless Tobacco Never Used  Tobacco Comment   Quit over 40 years ago    Goals Met:  Independence with exercise equipment Exercise tolerated well No report of cardiac concerns or symptoms Strength training completed today  Goals Unmet:  Not Applicable  Comments: Pt able to follow exercise prescription today without complaint.  Will continue to monitor for progression.    Dr. Emily Filbert is Medical Director for [place] [facility] and [facility].

## 2020-01-25 ENCOUNTER — Other Ambulatory Visit: Payer: Self-pay

## 2020-01-25 DIAGNOSIS — Z951 Presence of aortocoronary bypass graft: Secondary | ICD-10-CM

## 2020-01-25 DIAGNOSIS — I5022 Chronic systolic (congestive) heart failure: Secondary | ICD-10-CM

## 2020-01-25 NOTE — Progress Notes (Signed)
Daily Session Note  Patient Details  Name: Devin [name] [facility] MRN: [id number] Date of Birth: 11/02/1945 Referring Provider:     Pulmonary Rehab from 01/12/2020 in [facility] Cardiac and Pulmonary Rehab  Referring Provider Humphrey Rolls      Encounter Date: 01/25/2020  Check In:  Session Check In - 01/25/20 1612      Check-In   Supervising physician immediately available to respond to emergencies See telemetry face sheet for immediately available ER MD    Location [facility]-Cardiac & Pulmonary Rehab    Staff Present Birdie Sons, MPA, Mauricia Area, BS, ACSM CEP, Exercise Physiologist;Jessica [place], MA, RCEP, CCRP, Marylynn Pearson, MS Exercise Physiologist    Virtual Visit No    Medication changes reported     No    Fall or balance concerns reported    No    Tobacco Cessation No Change    Warm-up and Cool-down Performed on first and last piece of equipment    Resistance Training Performed Yes    VAD Patient? No    PAD/SET Patient? No      Pain Assessment   Currently in Pain? No/denies              Social History   Tobacco Use  Smoking Status Former Smoker  Smokeless Tobacco Never Used  Tobacco Comment   Quit over 40 years ago    Goals Met:  Independence with exercise equipment Exercise tolerated well No report of cardiac concerns or symptoms Strength training completed today  Goals Unmet:  Not Applicable  Comments: Pt able to follow exercise prescription today without complaint.  Will continue to monitor for progression.    Dr. Emily Filbert is Medical Director for [place] and [facility].

## 2020-01-27 ENCOUNTER — Other Ambulatory Visit: Payer: Self-pay

## 2020-01-27 DIAGNOSIS — Z951 Presence of aortocoronary bypass graft: Secondary | ICD-10-CM

## 2020-01-27 DIAGNOSIS — I5022 Chronic systolic (congestive) heart failure: Secondary | ICD-10-CM | POA: Diagnosis not present

## 2020-01-27 NOTE — Progress Notes (Signed)
Daily Session Note  Patient Details  Name: Devin [name] [facility] MRN: [id number] Date of Birth: 13-Feb-1946 Referring Provider:     Pulmonary Rehab from 01/12/2020 in [facility] Cardiac and Pulmonary Rehab  Referring Provider Humphrey Rolls      Encounter Date: 01/27/2020  Check In:  Session Check In - 01/27/20 1608      Check-In   Supervising physician immediately available to respond to emergencies See telemetry face sheet for immediately available ER MD    Location [facility]-Cardiac & Pulmonary Rehab    Staff Present Birdie Sons, MPA, RN;Joseph Lou Miner, [place] Exercise Physiologist    Virtual Visit No    Medication changes reported     No    Fall or balance concerns reported    No    Tobacco Cessation No Change    Warm-up and Cool-down Performed on first and last piece of equipment    Resistance Training Performed Yes    VAD Patient? No    PAD/SET Patient? No      Pain Assessment   Currently in Pain? No/denies              Social History   Tobacco Use  Smoking Status Former Smoker  Smokeless Tobacco Never Used  Tobacco Comment   Quit over 40 years ago    Goals Met:  Independence with exercise equipment Exercise tolerated well No report of cardiac concerns or symptoms Strength training completed today  Goals Unmet:  Not Applicable  Comments: Pt able to follow exercise prescription today without complaint.  Will continue to monitor for progression.    Dr. Emily Filbert is Medical Director for [place] and [facility].

## 2020-01-28 ENCOUNTER — Telehealth: Payer: Self-pay

## 2020-01-28 NOTE — Telephone Encounter (Signed)
Ed called to report severe cramping after exercise ( after going home and sitting in a chair).  This has been progressively worse since he started exercising. He reports drinking very little water since having hyponatremia. This EP recommended he call his Dr for more guidance on fluid intake.

## 2020-02-01 ENCOUNTER — Encounter: Payer: Self-pay | Admitting: *Deleted

## 2020-02-01 ENCOUNTER — Ambulatory Visit
Admission: RE | Admit: 2020-02-01 | Discharge: 2020-02-01 | Disposition: A | Discharge status: Home / Self Care | Payer: Medicare HMO | Source: Home / Self Care | Attending: Family Medicine | Admitting: Family Medicine

## 2020-02-01 ENCOUNTER — Ambulatory Visit
Admission: RE | Admit: 2020-02-01 | Discharge: 2020-02-01 | Disposition: A | Discharge status: Home / Self Care | Payer: Medicare HMO | Source: Ambulatory Visit | Attending: Family Medicine | Admitting: Family Medicine

## 2020-02-01 ENCOUNTER — Emergency Department
Admission: EM | Admit: 2020-02-01 | Discharge: 2020-02-01 | Disposition: A | Discharge status: Home / Self Care | Payer: Medicare HMO | Attending: Emergency Medicine | Admitting: Emergency Medicine

## 2020-02-01 ENCOUNTER — Emergency Department: Payer: Medicare HMO

## 2020-02-01 ENCOUNTER — Other Ambulatory Visit: Payer: Self-pay

## 2020-02-01 ENCOUNTER — Other Ambulatory Visit: Payer: Self-pay | Admitting: Family Medicine

## 2020-02-01 DIAGNOSIS — Z955 Presence of coronary angioplasty implant and graft: Secondary | ICD-10-CM | POA: Insufficient documentation

## 2020-02-01 DIAGNOSIS — I509 Heart failure, unspecified: Secondary | ICD-10-CM | POA: Insufficient documentation

## 2020-02-01 DIAGNOSIS — M5440 Lumbago with sciatica, unspecified side: Secondary | ICD-10-CM

## 2020-02-01 DIAGNOSIS — Z7984 Long term (current) use of oral hypoglycemic drugs: Secondary | ICD-10-CM | POA: Insufficient documentation

## 2020-02-01 DIAGNOSIS — Z87891 Personal history of nicotine dependence: Secondary | ICD-10-CM | POA: Diagnosis not present

## 2020-02-01 DIAGNOSIS — I2510 Atherosclerotic heart disease of native coronary artery without angina pectoris: Secondary | ICD-10-CM | POA: Insufficient documentation

## 2020-02-01 DIAGNOSIS — I4891 Unspecified atrial fibrillation: Secondary | ICD-10-CM | POA: Diagnosis not present

## 2020-02-01 DIAGNOSIS — Z7901 Long term (current) use of anticoagulants: Secondary | ICD-10-CM | POA: Diagnosis not present

## 2020-02-01 DIAGNOSIS — Z96653 Presence of artificial knee joint, bilateral: Secondary | ICD-10-CM | POA: Insufficient documentation

## 2020-02-01 DIAGNOSIS — M545 Low back pain, unspecified: Secondary | ICD-10-CM | POA: Diagnosis present

## 2020-02-01 DIAGNOSIS — I110 Hypertensive heart disease with heart failure: Secondary | ICD-10-CM | POA: Insufficient documentation

## 2020-02-01 DIAGNOSIS — E119 Type 2 diabetes mellitus without complications: Secondary | ICD-10-CM | POA: Insufficient documentation

## 2020-02-01 DIAGNOSIS — M5410 Radiculopathy, site unspecified: Secondary | ICD-10-CM

## 2020-02-01 DIAGNOSIS — Z79899 Other long term (current) drug therapy: Secondary | ICD-10-CM | POA: Diagnosis not present

## 2020-02-01 DIAGNOSIS — R0902 Hypoxemia: Secondary | ICD-10-CM | POA: Insufficient documentation

## 2020-02-01 DIAGNOSIS — I7101 Dissection of thoracic aorta: Secondary | ICD-10-CM | POA: Diagnosis not present

## 2020-02-01 LAB — COMPREHENSIVE METABOLIC PANEL
ALT: 30 U/L (ref 0–44)
AST: 27 U/L (ref 15–41)
Albumin: 4 g/dL (ref 3.5–5.0)
Alkaline Phosphatase: 64 U/L (ref 38–126)
Anion gap: 4 — ABNORMAL LOW (ref 5–15)
BUN: 17 mg/dL (ref 8–23)
CO2: 27 mmol/L (ref 22–32)
Calcium: 9.4 mg/dL (ref 8.9–10.3)
Chloride: 108 mmol/L (ref 98–111)
Creatinine, Ser: 0.9 mg/dL (ref 0.61–1.24)
GFR, Estimated: >60 mL/min (ref >60)
Glucose, Bld: 152 mg/dL — ABNORMAL HIGH (ref 70–99)
Potassium: 5.2 mmol/L — ABNORMAL HIGH (ref 3.5–5.1)
Sodium: 139 mmol/L (ref 135–145)
Total Bilirubin: 0.5 mg/dL (ref 0.3–1.2)
Total Protein: 7.3 g/dL (ref 6.5–8.1)

## 2020-02-01 LAB — CBC WITH DIFFERENTIAL/PLATELET
Abs Immature Granulocytes: 0.04 10*3/uL (ref 0.00–0.07)
Basophils Absolute: 0.1 10*3/uL (ref 0.0–0.1)
Basophils Relative: 1 %
Eosinophils Absolute: 0.1 10*3/uL (ref 0.0–0.5)
Eosinophils Relative: 2 %
HCT: 45.8 % (ref 39.0–52.0)
Hemoglobin: 14.8 g/dL (ref 13.0–17.0)
Immature Granulocytes: 1 %
Lymphocytes Relative: 23 %
Lymphs Abs: 1.9 10*3/uL (ref 0.7–4.0)
MCH: 30.6 pg (ref 26.0–34.0)
MCHC: 32.3 g/dL (ref 30.0–36.0)
MCV: 94.6 fL (ref 80.0–100.0)
Monocytes Absolute: 0.6 10*3/uL (ref 0.1–1.0)
Monocytes Relative: 8 %
Neutro Abs: 5.5 10*3/uL (ref 1.7–7.7)
Neutrophils Relative %: 65 %
Platelets: 344 10*3/uL (ref 150–400)
RBC: 4.84 MIL/uL (ref 4.22–5.81)
RDW: 14.5 % (ref 11.5–15.5)
WBC: 8.3 10*3/uL (ref 4.0–10.5)
nRBC: 0 % (ref 0.0–0.2)

## 2020-02-01 MED ORDER — IOHEXOL 350 MG/ML SOLN
100.0000 mL | Freq: Once | INTRAVENOUS | Status: AC | PRN
  Administered 2020-02-01: 100 mL via INTRAVENOUS

## 2020-02-01 NOTE — Discharge Instructions (Addendum)
Continue take the prednisone as prescribed.  Follow-up with your regular doctor.  You will need ultrasound surveillance of your abdominal aortic aneurysm in the future as determined by your regular doctor.  Return to the ER for new, worsening, or persistent severe back pain, weakness or numbness, or any other new or worsening symptoms that concern you.

## 2020-02-01 NOTE — ED Triage Notes (Signed)
Pt brought in via ems from home.  Pt has back pain for 1 week.  Pt had xrays today and was called to be eval for aneurysm.  Pt reports pain in both legs.  Pt alert  Speech clear.

## 2020-02-01 NOTE — ED Provider Notes (Signed)
[facility] Emergency Department Provider Note ____________________________________________   First MD Initiated Contact with Patient 02/01/20 2217     (approximate)  I have reviewed the triage vital signs and the nursing notes.   HISTORY  Chief Complaint Back Pain    HPI Devin Amsden. is a 74 y.o. male with PMH as noted below who presents after he was sent in from radiology because outpatient x-rays of his lower back showed a possible aortic aneurysm.  The patient reports lower back pain over approximately the last 5 days.  He states that he did vigorous exercise on a different machine at the gym last week, and later that day he started to have pain in bilateral hips and lower back.  The pain sometimes shoots down his legs.  He denies any associated numbness or weakness.  He is able to walk without difficulty.  He has been taking Tylenol, and states that the pain has gradually gotten better.  He saw his regular doctor about this and was sent for the x-rays today.  The patient knows that he has a AAA and states his doctor has been surveilling it.   Past Medical History:  Diagnosis Date  . Arrhythmia    atrial fibrillation  . CAD (coronary artery disease)   . CHF (congestive heart failure) (HCC)   . Diabetes mellitus without complication (HCC)   . Hypertension   . MI, old   . Sleep apnea     Patient Active Problem List   Diagnosis Date Noted  . Hypoxia   . Congestive heart failure (CHF) (HCC) 12/13/2019  . Hyponatremia 12/06/2019  . Chest pain 05/21/2019  . Unstable angina (HCC)   . Ischemic chest pain (HCC) 05/16/2019  . CAD (coronary artery disease) 05/16/2019  . Type 2 diabetes mellitus without complication (HCC) 05/16/2019  . Essential hypertension 05/16/2019  . Depression 05/16/2019  . GERD (gastroesophageal reflux disease) 05/16/2019    Past Surgical History:  Procedure Laterality Date  . APPENDECTOMY    . BREAST SURGERY      benign mass  . CARDIOVERSION Right 12/15/2019   Procedure: CARDIOVERSION;  Surgeon: Laurier Nancy, MD;  Location: [facility] ORS;  Service: Cardiovascular;  Laterality: Right;  . CORONARY ANGIOPLASTY WITH STENT PLACEMENT    . LEFT HEART CATH AND CORS/GRAFTS ANGIOGRAPHY N/A 05/21/2019   Procedure: LEFT HEART CATH AND CORONARY ANGIOGRAPHY;  Surgeon: Laurier Nancy, MD;  Location: [facility] INVASIVE CV LAB;  Service: Cardiovascular;  Laterality: N/A;  . REPLACEMENT TOTAL KNEE BILATERAL      Prior to Admission medications   Medication Sig Start Date End Date Taking? Authorizing Provider  amiodarone (PACERONE) 400 MG tablet Take 1 tablet (400 mg total) by mouth daily. Patient not taking: Reported on 01/11/2020 12/16/19   Arnetha Courser, MD  apixaban (ELIQUIS) 5 MG TABS tablet Take 5 mg by mouth 2 (two) times daily.     [provider]  atorvastatin (LIPITOR) 80 MG tablet Take 1 tablet (80 mg total) by mouth daily. 05/16/19 05/15/20  Lucile Shutters, MD  buPROPion (WELLBUTRIN XL) 300 MG 24 hr tablet Take 300 mg by mouth daily.     [provider]  carvedilol (COREG) 25 MG tablet Take 1 tablet (25 mg total) by mouth 2 (two) times daily. 05/16/19 05/15/20  Agbata, Tochukwu, MD  ENTRESTO 49-51 MG Take 1 tablet by mouth 2 (two) times daily. 11/09/19   [provider]  EPINEPHrine 0.3 mg/0.3 mL IJ SOAJ injection Inject  0.3 mg into the muscle as needed for anaphylaxis.    [provider]  ezetimibe (ZETIA) 10 MG tablet Take 10 mg by mouth daily. 04/06/19   [provider]  furosemide (LASIX) 20 MG tablet Take 1 tablet (20 mg total) by mouth daily. Patient not taking: Reported on 01/11/2020 12/15/19 12/14/20  Arnetha Courser, MD  gabapentin (NEURONTIN) 300 MG capsule Take 300 mg by mouth 2 (two) times daily. 05/01/19   [provider]  metFORMIN (GLUCOPHAGE) 500 MG tablet Take 500 mg by mouth 2 (two) times daily. 02/17/19   [provider]  nitroGLYCERIN (NITROSTAT) 0.4  MG SL tablet Place 0.4 mg under the tongue every 5 (five) minutes as needed for chest pain.    [provider]  sertraline (ZOLOFT) 25 MG tablet Take 25 mg by mouth daily. 11/27/19   [provider]  sildenafil (VIAGRA) 50 MG tablet Take 50 mg by mouth daily as needed for erectile dysfunction.    [provider]  benazepril (LOTENSIN) 20 MG tablet Take 20 mg by mouth daily. 03/19/19 08/13/19  [provider]    Allergies Ace inhibitors, Morphine, and Yellow jacket venom [bee venom]  Family History  Problem Relation Age of Onset  . Osteoarthritis Mother   . Other Father 105       MVA    Social History Social History   Tobacco Use  . Smoking status: Former Games developer  . Smokeless tobacco: Never Used  . Tobacco comment: Quit over 40 years ago  Vaping Use  . Vaping Use: Never used  Substance Use Topics  . Alcohol use: Not Currently    Comment: social  . Drug use: Never    Review of Systems  Constitutional: No fever/chills. Eyes: No redness. ENT: No sore throat. Cardiovascular: Denies chest pain. Respiratory: Denies shortness of breath. Gastrointestinal: No vomiting. Genitourinary: Negative for dysuria.  Musculoskeletal: Positive for back pain. Skin: Negative for rash. Neurological: Negative for focal weakness or numbness.   ____________________________________________   PHYSICAL EXAM:  VITAL SIGNS: ED Triage Vitals  Enc Vitals Group     BP 02/01/20 1817 134/77     Pulse Rate 02/01/20 1817 68     Resp 02/01/20 1817 18     Temp 02/01/20 1817 97.9 F (36.6 C)     Temp Source 02/01/20 1817 Oral     SpO2 02/01/20 1817 98 %     Weight 02/01/20 1819 204 lb (92.5 kg)     Height 02/01/20 1819 6' (1.829 m)     Head Circumference --      Peak Flow --      Pain Score 02/01/20 1819 0     Pain Loc --      Pain Edu? --      Excl. in GC? --     Constitutional: Alert and oriented. Well appearing and in no acute distress. Eyes: Conjunctivae  are normal.  Head: Atraumatic. Nose: No congestion/rhinnorhea. Mouth/Throat: Mucous membranes are moist.   Neck: Normal range of motion.  Cardiovascular: Normal rate, regular rhythm.   Good peripheral circulation. Respiratory: Normal respiratory effort.  No retractions. Gastrointestinal: Soft and nontender. No distention.  Genitourinary: No CVA tenderness. Musculoskeletal: No lower extremity edema.  Extremities warm and well perfused.  No midline spinal tenderness.  Negative straight leg raise bilaterally. Neurologic:  Normal speech and language.  5/5 motor strength and intact sensation to bilateral lower extremities. Skin:  Skin is warm and dry. No rash noted. Psychiatric: Mood and  affect are normal. Speech and behavior are normal.  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  74 year old male with PMH as noted above presents after he was referred because outpatient x-rays of the lumbar spine revealed a likely AAA.  He has been having back pain over the last 5  days which has been improving, and he was started on a steroid course today.  On exam, the patient is well-appearing.  His vital signs are normal.  He has no midline spinal tenderness or any rash or lesions on the lower back.  He has full range of motion of bilateral hips and knees, negative straight leg raise test bilaterally, and normal sensation and motor strength in bilateral lower extremities.  Basic labs and a CT angio were ordered when the patient was waiting given the x-ray findings and the relatively acute back pain.  Lab work-up is unremarkable except for borderline elevated potassium, however the patient has no renal insufficiency or history of hyperkalemia.  This is likely due to mild hemolysis.  There is no evidence of any clinical significance.  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  There are other subacute and chronic findings on the CT with no significant acute abnormalities.  Overall the patient's back pain is consistent with muscular strain/sciatica.  There is no indication for further imaging or work-up in the ED.  He started a steroid course today and has been taking Tylenol with good relief.  He is stable for discharge.  Return precautions given, and he expresses understanding.  ____________________________________________   FINAL CLINICAL IMPRESSION(S) / ED DIAGNOSES  Final diagnoses:  Acute bilateral low back pain with sciatica, sciatica laterality unspecified      NEW MEDICATIONS STARTED DURING THIS VISIT:  New Prescriptions   No medications on file     Note:  This document was prepared using Dragon voice recognition software and may include unintentional dictation errors.    Dionne Bucy, MD 02/01/20 2248

## 2020-02-03 ENCOUNTER — Telehealth: Payer: Self-pay

## 2020-02-03 NOTE — Telephone Encounter (Signed)
Spoke with patient who reported he is experiencing sciatic pain. He was prescribed Prednisone which he feels is helping- he reports he will be out next week for [facility] and is not sure when he will be back. Patient will plan to call us next week to keep [place] updated.

## 2020-02-10 ENCOUNTER — Encounter: Payer: Self-pay | Admitting: *Deleted

## 2020-02-10 DIAGNOSIS — I5022 Chronic systolic (congestive) heart failure: Secondary | ICD-10-CM

## 2020-02-10 NOTE — Progress Notes (Signed)
Pulmonary Individual Treatment Plan  Patient Details  Name: Devin [name]. MRN: [id number] Date of Birth: [birth year] Referring Provider:     Pulmonary Rehab from 01/12/2020 in [name]  Referring Provider Devin [name]      Initial Encounter Date:    Pulmonary Rehab from 01/12/2020 in [facility] Pulmonary Re[facility]  Date 01/12/20      Visit Diagnosis: Heart failure, chronic systolic (HCC)  Patient's Home Medications on Admission:  Current Outpatient Medications:  .  amiodarone (PACERONE) 400 MG tablet, Take 1 tablet (400 mg total) by mouth daily. (Patient not taking: Reported on 01/11/2020), Disp: 30 tablet, Rfl: 0 .  apixaban (ELIQUIS) 5 MG TABS tablet, Take 5 mg by mouth 2 (two) times daily. , Disp: , Rfl:  .  atorvastatin (LIPITOR) 80 MG tablet, Take 1 tablet (80 mg total) by mouth daily., Disp: 30 tablet, Rfl: 11 .  buPROPion (WELLBUTRIN XL) 300 MG 24 hr tablet, Take 300 mg by mouth daily. , Disp: , Rfl:  .  carvedilol (COREG) 25 MG tablet, Take 1 tablet (25 mg total) by mouth 2 (two) times daily., Disp: 60 tablet, Rfl: 11 .  ENTRESTO 49-51 MG, Take 1 tablet by mouth 2 (two) times daily., Disp: , Rfl:  .  EPINEPHrine 0.3 mg/0.3 mL IJ SOAJ injection, Inject 0.3 mg into the muscle as needed for anaphylaxis., Disp: , Rfl:  .  ezetimibe (ZETIA) 10 MG tablet, Take 10 mg by mouth daily., Disp: , Rfl:  .  furosemide (LASIX) 20 MG tablet, Take 1 tablet (20 mg total) by mouth daily. (Patient not taking: Reported on 01/11/2020), Disp: 30 tablet, Rfl: 11 .  gabapentin (NEURONTIN) 300 MG capsule, Take 300 mg by mouth 2 (two) times daily., Disp: , Rfl:  .  metFORMIN (GLUCOPHAGE) 500 MG tablet, Take 500 mg by mouth 2 (two) times daily., Disp: , Rfl:  .  nitroGLYCERIN (NITROSTAT) 0.4 MG SL tablet, Place 0.4 mg under the tongue every 5 (five) minutes as needed for chest pain., Disp: , Rfl:  .  sertraline (ZOLOFT) 25 MG tablet, Take 25 mg by mouth daily., Disp: , Rfl:  .   sildenafil (VIAGRA) 50 MG tablet, Take 50 mg by mouth daily as needed for erectile dysfunction., Disp: , Rfl:  No current facility-administered medications for this visit.  Facility-Administered Medications Ordered in Other Visits:  .  sodium chloride flush (NS) 0.9 % injection 3 mL, 3 mL, Intravenous, Q12H, Devin David, MD  Past Medical History: Past Medical History:  Diagnosis Date  . Arrhythmia    atrial fibrillation  . CAD (coronary artery disease)   . CHF (congestive heart failure) ([place])   . Diabetes mellitus without complication ([place])   . Hypertension   . MI, old   . Sleep apnea     Tobacco Use: Social History   Tobacco Use  Smoking Status Former Smoker  Smokeless Tobacco Never Used  Tobacco Comment   Quit over 40 years ago    Labs: Recent Review [facility] for [facility] Cardiac and Pulmonary Rehab Latest Ref Rng & Units 05/16/2019 12/06/2019   Hemoglobin A1c 4.8 - 5.6 % 5.8(H) 6.0(H)       Pulmonary Assessment Scores:  Pulmonary Assessment Scores    Row Name 01/12/20 1503         ADL UCSD   SOB Score total 34     Rest 0     Walk 1  Stairs 4     Bath 1     Dress 1     Shop 1       CAT Score   CAT Score 2       mMRC Score   mMRC Score 1            UCSD: Self-administered rating of dyspnea associated with activities of daily living (ADLs) 6-point scale (0 = "not at all" to 5 = "maximal or unable to do because of breathlessness")  Scoring Scores range from 0 to 120.  Minimally important difference is 5 units  CAT: CAT can identify the health impairment of COPD patients and is better correlated with disease progression.  CAT has a scoring range of zero to 40. The CAT score is classified into four groups of low (less than 10), medium (10 - 20), high (21-30) and very high (31-40) based on the impact level of disease on health status. A CAT score over 10 suggests significant symptoms.  A worsening CAT score could be explained by an  exacerbation, poor medication adherence, poor inhaler technique, or progression of COPD or comorbid conditions.  CAT MCID is 2 points  mMRC: mMRC (Modified Medical Research Council) Dyspnea Scale is used to assess the degree of baseline functional disability in patients of respiratory disease due to dyspnea. No minimal important difference is established. A decrease in score of 1 point or greater is considered a positive change.   Pulmonary Function Assessment:   Exercise Target Goals: Exercise Program Goal: Individual exercise prescription set using results from initial 6 min walk test and THRR while considering  patient's activity barriers and safety.   Exercise Prescription Goal: Initial exercise prescription builds to 30-45 minutes a day of aerobic activity, 2-3 days per week.  Home exercise guidelines will be given to patient during program as part of exercise prescription that the participant will acknowledge.  Education: Aerobic Exercise: - Group verbal and visual presentation on the components of exercise prescription. Introduces F.I.T.T principle from ACSM for exercise prescriptions.  Reviews F.I.T.T. principles of aerobic exercise including progression. Written material given at graduation.   Education: Resistance Exercise: - Group verbal and visual presentation on the components of exercise prescription. Introduces F.I.T.T principle from ACSM for exercise prescriptions  Reviews F.I.T.T. principles of resistance exercise including progression. Written material given at graduation.    Education: Exercise & Equipment Safety: - Individual verbal instruction and demonstration of equipment use and safety with use of the equipment.   Cardiac Rehab from 01/13/2020 in [name]  Date 01/12/20  Educator AS  Instruction Review Code 1- Verbalizes Understanding      Education: Exercise Physiology & General Exercise Guidelines: - Group verbal and written  instruction with models to review the exercise physiology of the cardiovascular system and associated critical values. Provides general exercise guidelines with specific guidelines to those with heart or lung disease.    Cardiac Rehab from 10/07/2019 in [name]  Date 09/23/19  Educator AS  Instruction Review Code 1- Verbalizes Understanding      Education: Flexibility, Balance, Mind/Body Relaxation: - Group verbal and visual presentation with interactive activity on the components of exercise prescription. Introduces F.I.T.T principle from ACSM for exercise prescriptions. Reviews F.I.T.T. principles of flexibility and balance exercise training including progression. Also discusses the mind body connection.  Reviews various relaxation techniques to help reduce and manage stress (i.e. Deep breathing, progressive muscle relaxation, and visualization). Balance handout provided to take  home. Written material given at graduation.   Activity Barriers & Risk Stratification:   6 Minute Walk:  6 Minute Walk    Row Name 10/26/19 1419 01/12/20 1455       6 Minute Walk   Phase Discharge Initial    Distance -- 680 feet    Distance % Change 1.38 % --    Distance Feet Change 15 ft --    Walk Time 6 minutes 6 minutes    # of Rest Breaks 0 0    MPH 2.08 1.28    METS 2.25 1.3    RPE 11 13    Perceived Dyspnea  -- 1    VO2 Peak 7.87 4.57    Symptoms No No    Resting HR 80 bpm 61 bpm    Resting BP 130/64 136/60    Resting Oxygen Saturation  -- 95 %    Exercise Oxygen Saturation  during 6 min walk -- 94 %    Max Ex. HR 92 bpm 74 bpm    Max Ex. BP 142/72 124/66    2 Minute Post BP -- 126/66      Interval HR   1 Minute HR -- 65    2 Minute HR -- 74    3 Minute HR -- 69    4 Minute HR -- 67    5 Minute HR -- 66    6 Minute HR -- 68    2 Minute Post HR -- 64    Interval Heart Rate? -- Yes      Interval Oxygen   Interval Oxygen? -- Yes    Baseline Oxygen Saturation %  -- 95 %    1 Minute Oxygen Saturation % -- 94 %    1 Minute Liters of Oxygen -- 0 L    2 Minute Oxygen Saturation % -- 95 %    2 Minute Liters of Oxygen -- 0 L    3 Minute Oxygen Saturation % -- 94 %    3 Minute Liters of Oxygen -- 0 L    4 Minute Oxygen Saturation % -- 95 %    4 Minute Liters of Oxygen -- 0 L    5 Minute Oxygen Saturation % -- 96 %    5 Minute Liters of Oxygen -- 0 L    6 Minute Oxygen Saturation % -- 96 %    6 Minute Liters of Oxygen -- 0 L    2 Minute Post Oxygen Saturation % -- 95 %    2 Minute Post Liters of Oxygen -- 0 L          Oxygen Initial Assessment:   Oxygen Re-Evaluation:  Oxygen Re-Evaluation    Row Name 01/18/20 1632             Program Oxygen Prescription   Program Oxygen Prescription None         Home Oxygen   Home Oxygen Device None       Home Exercise Oxygen Prescription None       Home Resting Oxygen Prescription None         Goals/Expected Outcomes   Short Term Goals To learn and understand importance of monitoring SPO2 with pulse oximeter and demonstrate accurate use of the pulse oximeter.;To learn and understand importance of maintaining oxygen saturations>88%;To learn and demonstrate proper pursed lip breathing techniques or other breathing techniques.;To learn and demonstrate proper use of respiratory medications;Other       Long  Term Goals Verbalizes importance of monitoring SPO2 with pulse oximeter and return demonstration;Maintenance of O2 saturations>88%;Exhibits proper breathing techniques, such as pursed lip breathing or other method taught during program session;Compliance with respiratory medication       Comments Reviewed PLB technique with pt.  Talked about how it works and it's importance in maintaining their exercise saturations.       Goals/Expected Outcomes Short: Become more profiecient at using PLB.   Long: Become independent at using PLB.              Oxygen Discharge (Final Oxygen Re-Evaluation):  Oxygen  Re-Evaluation - 01/18/20 1632      Program Oxygen Prescription   Program Oxygen Prescription None      Home Oxygen   Home Oxygen Device None    Home Exercise Oxygen Prescription None    Home Resting Oxygen Prescription None      Goals/Expected Outcomes   Short Term Goals To learn and understand importance of monitoring SPO2 with pulse oximeter and demonstrate accurate use of the pulse oximeter.;To learn and understand importance of maintaining oxygen saturations>88%;To learn and demonstrate proper pursed lip breathing techniques or other breathing techniques.;To learn and demonstrate proper use of respiratory medications;Other    Long  Term Goals Verbalizes importance of monitoring SPO2 with pulse oximeter and return demonstration;Maintenance of O2 saturations>88%;Exhibits proper breathing techniques, such as pursed lip breathing or other method taught during program session;Compliance with respiratory medication    Comments Reviewed PLB technique with pt.  Talked about how it works and it's importance in maintaining their exercise saturations.    Goals/Expected Outcomes Short: Become more profiecient at using PLB.   Long: Become independent at using PLB.           Initial Exercise Prescription:  Initial Exercise Prescription - 01/12/20 1400      Date of Initial Exercise RX and Referring Provider   Date 01/12/20    Referring Provider Devin [name]      Treadmill   MPH 1    Grade 0.5    Minutes 15    METs 1.83      Recumbant Bike   Level 1    Minutes 15    METs 1.5      NuStep   Level 1    SPM 80    Minutes 15    METs 1.5      Recumbant Elliptical   Level 1    RPM 50    Minutes 15    METs 1.5      REL-XR   Level 1    Watts 50    Minutes 15    METs 1.5      Biostep-RELP   Level 1    SPM 50    Minutes 15    METs 1.5      Prescription Details   Frequency (times per week) 3    Duration Progress to 30 minutes of continuous aerobic without signs/symptoms of physical  distress      Intensity   THRR 40-80% of Max Heartrate 95-128    Ratings of Perceived Exertion 11-15    Perceived Dyspnea 0-4      Resistance Training   Training Prescription Yes    Weight 3 lb    Reps 10-15           Perform Capillary Blood Glucose checks as needed.  Exercise Prescription Changes:  Exercise Prescription Changes    Row Name 08/25/19 1400 09/08/19 1300 09/22/19 1300  10/06/19 1300 10/20/19 1300     Response to Exercise   Blood Pressure (Admit) 122/66 126/56 110/72 110/60 146/74   Blood Pressure (Exercise) 138/78 126/70 152/70 180/88 162/76   Blood Pressure (Exit) 122/64 96/54 104/66 118/70 128/74   Heart Rate (Admit) 84 bpm 69 bpm 71 bpm 70 bpm 75 bpm   Heart Rate (Exercise) 129 bpm 93 bpm 88 bpm 90 bpm 90 bpm   Heart Rate (Exit) 71 bpm 67 bpm 64 bpm 69 bpm 73 bpm   Rating of Perceived Exertion (Exercise) 14 17 15 17 15    Symptoms none fatigue on elliptical -- none none   Duration Continue with 30 min of aerobic exercise without signs/symptoms of physical distress. Continue with 30 min of aerobic exercise without signs/symptoms of physical distress. Continue with 30 min of aerobic exercise without signs/symptoms of physical distress. Continue with 30 min of aerobic exercise without signs/symptoms of physical distress. Continue with 30 min of aerobic exercise without signs/symptoms of physical distress.   Intensity THRR unchanged THRR unchanged THRR unchanged THRR unchanged THRR unchanged     Progression   Progression Continue to progress workloads to maintain intensity without signs/symptoms of physical distress. Continue to progress workloads to maintain intensity without signs/symptoms of physical distress. Continue to progress workloads to maintain intensity without signs/symptoms of physical distress. Continue to progress workloads to maintain intensity without signs/symptoms of physical distress. Continue to progress workloads to maintain intensity without  signs/symptoms of physical distress.   Average METs 3.85 3.13 2.75 3.29 3     Resistance Training   Training Prescription Yes Yes Yes Yes Yes   Weight 5 lb 5 lb 5 lb 5 lb 5 lb   Reps 10-15 10-15 10-15 10-15 10-15     Interval Training   Interval Training No No No No No     Treadmill   MPH -- 2.4 2.6 2.6 2.3   Grade -- 0.5 0.5 0.5 0.5   Minutes -- 15 15 15 15    METs -- 3 3.17 3.17 2.92     NuStep   Level 4 -- 5 -- --   SPM 80 -- 80 -- --   Minutes 15 -- 15 -- --   METs 4 -- 2.3 -- --     Elliptical   Level -- 1 -- -- --   Speed -- 2 -- -- --   Minutes -- 15 -- -- --     REL-XR   Level -- 5 -- 5 6   Watts -- -- -- -- 50   Minutes -- 15 -- 15 15   METs -- 4.1 -- 3.3 3     T5 Nustep   Level -- 3 -- 5 --   Minutes -- 15 -- 15 --   METs -- 2.4 -- 3.4 --     Biostep-RELP   Level 4 -- -- -- --   SPM 50 -- -- -- --   Minutes 15 -- -- -- --   METs 3 -- -- -- --     Home Exercise Plan   Plans to continue exercise at Home (comment)  walking Home (comment)  walking Home (comment)  walking Home (comment)  walking Home (comment)  walking   Frequency Add 2 additional days to program exercise sessions. Add 2 additional days to program exercise sessions. Add 2 additional days to program exercise sessions. Add 2 additional days to program exercise sessions. Add 2 additional days to program exercise sessions.   Initial  Home Exercises Provided 08/03/19 08/03/19 08/03/19 08/03/19 08/03/19   Row Name 11/03/19 1300 01/12/20 1500 01/25/20 1200         Response to Exercise   Blood Pressure (Admit) 118/62 136/60 106/60     Blood Pressure (Exercise) 142/76 124/66 112/56     Blood Pressure (Exit) 100/60 126/66 110/62     Heart Rate (Admit) 70 bpm 61 bpm 63 bpm     Heart Rate (Exercise) 109 bpm 74 bpm 70 bpm     Heart Rate (Exit) 103 bpm 64 bpm 67 bpm     Oxygen Saturation (Admit) -- -- 95 %     Oxygen Saturation (Exercise) -- -- 94 %     Oxygen Saturation (Exit) -- -- 97 %      Rating of Perceived Exertion (Exercise) 13 13 12      Perceived Dyspnea (Exercise) -- 1 0     Symptoms none -- none     Comments -- -- second full day of exericse     Duration Continue with 30 min of aerobic exercise without signs/symptoms of physical distress. -- Continue with 30 min of aerobic exercise without signs/symptoms of physical distress.     Intensity THRR unchanged -- THRR unchanged       Progression   Progression Continue to progress workloads to maintain intensity without signs/symptoms of physical distress. -- Continue to progress workloads to maintain intensity without signs/symptoms of physical distress.     Average METs 3.53 -- 2.1       Resistance Training   Training Prescription Yes -- Yes     Weight 5 lb -- 5 lb     Reps 10-15 -- 10-15       Interval Training   Interval Training No -- No       Treadmill   MPH 2.3 -- 1.7     Grade 0 -- 0     Minutes 15 -- 15     METs 2.76 -- 2.3       REL-XR   Level 6 -- 3     Minutes 15 -- 15     METs 4.3 -- --       T5 Nustep   Level -- -- 1     Minutes -- -- 15     METs -- -- 1.9       Home Exercise Plan   Plans to continue exercise at Home (comment)  walking -- --     Frequency Add 2 additional days to program exercise sessions. -- --     Initial Home Exercises Provided 08/03/19 -- --            Exercise Comments:   Exercise Goals and Review:  Exercise Goals    Row Name 01/12/20 1501             Exercise Goals   Increase Physical Activity Yes       Intervention Provide advice, education, support and counseling about physical activity/exercise needs.;Develop an individualized exercise prescription for aerobic and resistive training based on initial evaluation findings, risk stratification, comorbidities and participant's personal goals.       Expected Outcomes Long Term: Add in home exercise to make exercise part of routine and to increase amount of physical activity.;Long Term: Exercising regularly at  least 3-5 days a week.;Short Term: Attend rehab on a regular basis to increase amount of physical activity.       Intervention Provide advice, education, support and counseling about physical  activity/exercise needs.;Develop an individualized exercise prescription for aerobic and resistive training based on initial evaluation findings, risk stratification, comorbidities and participant's personal goals.       Expected Outcomes Short Term: Increase workloads from initial exercise prescription for resistance, speed, and METs.;Short Term: Perform resistance training exercises routinely during rehab and add in resistance training at home;Long Term: Improve cardiorespiratory fitness, muscular endurance and strength as measured by increased METs and functional capacity ( )       Able to understand and use rate of perceived exertion (RPE) scale Yes       Intervention Provide education and explanation on how to use RPE scale       Expected Outcomes Short Term: Able to use RPE daily in rehab to express subjective intensity level;Long Term:  Able to use RPE to guide intensity level when exercising independently       Able to understand and use Dyspnea scale Yes       Intervention Provide education and explanation on how to use Dyspnea scale       Expected Outcomes Short Term: Able to use Dyspnea scale daily in rehab to express subjective sense of shortness of breath during exertion;Long Term: Able to use Dyspnea scale to guide intensity level when exercising independently       Knowledge and understanding of Target Heart Rate Range (THRR) Yes       Intervention Provide education and explanation of THRR including how the numbers were predicted and where they are located for reference       Expected Outcomes Short Term: Able to state/look up THRR;Short Term: Able to use daily as guideline for intensity in rehab;Long Term: Able to use THRR to govern intensity when exercising independently       Able to check pulse  independently Yes       Intervention Provide education and demonstration on how to check pulse in carotid and radial arteries.;Review the importance of being able to check your own pulse for safety during independent exercise       Expected Outcomes Short Term: Able to explain why pulse checking is important during independent exercise;Long Term: Able to check pulse independently and accurately       Intervention Provide education, explanation, and written materials on patient's individual exercise prescription       Expected Outcomes Short Term: Able to explain program exercise prescription;Long Term: Able to explain home exercise prescription to exercise independently              Exercise Goals Re-Evaluation :  Exercise Goals Re-Evaluation    Row Name 08/19/19 1345 08/25/19 1456 09/08/19 1329 09/09/19 1338 09/22/19 1320     Exercise Goal Re-Evaluation   Exercise Goals Review Increase Physical Activity;Increase Strength and Stamina;Understanding of Exercise Prescription Increase Physical Activity;Increase Strength and Stamina;Understanding of Exercise Prescription Increase Physical Activity;Increase Strength and Stamina;Understanding of Exercise Prescription Increase Physical Activity;Increase Strength and Stamina;Understanding of Exercise Prescription Increase Physical Activity;Increase Strength and Stamina;Understanding of Exercise Prescription   Comments Devin [name] has been doing well in rehab.  He is walking with his dog at home a 1/4 mile each day.  We talked about extending this time out some.  He does note he has some more stamina overall. Devin [name] is making steady progress.  He has increased to 2.4 mph on TM and 5 lb weights for strength. Devin [name] continues to do well in rehab.  He has decided to drop back to twice a week due to his schedule.  He was  able to get back on the ellipitcal for the first time yesterday and actually able to do 12 min!!!  We will continue to monitor his progress. Devin [name] is doing well and  continuing to walk on his off days. He is walking an extra day outside of rehab, he is interested in  adding more days on to his schedule. He walks for about 1 hour and plans to walk in the mall on days that are too hot. Devin [name] is making steady progress.  he has increase to 2.6 mph on TM.   Expected Outcomes Short: Walk longer at home Long; Continue to improve stamina. Short: continue to increase workloads Long :  improve MET level Short: Continue to improve stamina on the elliptical Long: Continue to improve stamina. Short: Add more walking to weekly routines Long: Continue to increase strength/endurace, progress MET level Short: attend consistently Long:  increase stamina   Row Name 10/06/19 1353 10/20/19 1353 10/26/19 1435 11/03/19 1354 01/18/20 1632     Exercise Goal Re-Evaluation   Exercise Goals Review Increase Physical Activity;Increase Strength and Stamina;Understanding of Exercise Prescription Increase Physical Activity;Increase Strength and Stamina;Understanding of Exercise Prescription Increase Physical Activity;Increase Strength and Stamina;Understanding of Exercise Prescription Increase Physical Activity;Increase Strength and Stamina;Understanding of Exercise Prescription Increase Physical Activity;Able to understand and use rate of perceived exertion (RPE) scale;Knowledge and understanding of Target Heart Rate Range (THRR);Understanding of Exercise Prescription;Increase Strength and Stamina;Able to check pulse independently;Able to understand and use Dyspnea scale   Comments Devin [name] has been doing well in rehab.  He is now up to level 5 on the T5 NuStep and XR.  We will continue to monitor his progress. Devin [name] continues to do well with exercise.  He is now on level 6 for XR and NS. Devin [name] is set to graduate next week.  He improved his post 6MWT some. He is planning to continue to exericse at [employer] in [place] using his [place]. Devin [name] was out on vacation this week.  He will graduate next week and go  to [employer]. Reviewed RPE and dyspnea scales, THR and program prescription with pt today.  Pt voiced understanding and was given a copy of goals to take home.   Expected Outcomes Short: Continue to increase workloads Long; Continue to improve stamina. Short: Continue to increase workloads Long; Continue to improve stamina. Continue to exercise independently. Continue to exercise independently. Short: Use RPE daily to regulate intensity. Long: Follow program prescription in THR.   [place] Name 01/25/20 1253             Exercise Goal Re-Evaluation   Exercise Goals Review Increase Physical Activity;Increase Strength and Stamina;Understanding of Exercise Prescription       Comments Devin [name] has completed his first two full days of exercise.  He is already at 1.9 METs on teh T5 NuStep.  We will continue to monitor his progress.       Expected Outcomes Short: Continue to attend regularly  Long: Continue to follow program prescription              Discharge Exercise Prescription (Final Exercise Prescription Changes):  Exercise Prescription Changes - 01/25/20 1200      Response to Exercise   Blood Pressure (Admit) 106/60    Blood Pressure (Exercise) 112/56    Blood Pressure (Exit) 110/62    Heart Rate (Admit) 63 bpm    Heart Rate (Exercise) 70 bpm    Heart Rate (Exit) 67 bpm    Oxygen Saturation (Admit) 95 %  Oxygen Saturation (Exercise) 94 %    Oxygen Saturation (Exit) 97 %    Rating of Perceived Exertion (Exercise) 12    Perceived Dyspnea (Exercise) 0    Symptoms none    Comments second full day of exericse    Duration Continue with 30 min of aerobic exercise without signs/symptoms of physical distress.    Intensity THRR unchanged      Progression   Progression Continue to progress workloads to maintain intensity without signs/symptoms of physical distress.    Average METs 2.1      Resistance Training   Training Prescription Yes    Weight 5 lb    Reps 10-15      Interval  Training   Interval Training No      Treadmill   MPH 1.7    Grade 0    Minutes 15    METs 2.3      REL-XR   Level 3    Minutes 15      T5 Nustep   Level 1    Minutes 15    METs 1.9           Nutrition:  Target Goals: Understanding of nutrition guidelines, daily intake of sodium '1500mg'$ , cholesterol '200mg'$ , calories 30% from fat and 7% or less from saturated fats, daily to have 5 or more servings of fruits and vegetables.  Education: All About Nutrition: -Group instruction provided by verbal, written material, interactive activities, discussions, models, and posters to present general guidelines for heart healthy nutrition including fat, fiber, MyPlate, the role of sodium in heart healthy nutrition, utilization of the nutrition label, and utilization of this knowledge for meal planning. Follow up email sent as well. Written material given at graduation.   Biometrics:  Pre Biometrics - 01/12/20 1502      Pre Biometrics   Height $Remov'5\' 11"'YWonsQ$  (1.803 m)    Weight 199 lb 11.2 oz (90.6 kg)    BMI (Calculated) 27.86    Single Leg Stand 0 seconds           Post Biometrics - 10/26/19 1420       Post  Biometrics   Height 5' 11.1" (1.806 m)    Weight 208 lb (94.3 kg)    BMI (Calculated) 28.93    Single Leg Stand 3.26 seconds           Nutrition Therapy Plan and Nutrition Goals:  Nutrition Therapy & Goals - 01/25/20 1605      Nutrition Therapy   Diet Low Na, Heart Healthy, Diabetes Friendly, pulmonary MNT    Drug/Food Interactions Statins/Certain Fruits    Protein (specify units) 95g    Fiber 30 grams    Whole Grain Foods 3 servings    Saturated Fats 12 max. grams    Fruits and Vegetables 8 servings/day    Sodium 1.5 grams      Personal Nutrition Goals   Nutrition Goal ST: Protein (greek yogurt, cottage cheese, meat, fish, chicken, beans, eggs) with all meals, add two snacks before and after exercise (with protein and carbohydrates). LT: meet protein needs, gain muscle  back from recent weight loss    Comments B: cheerios with banana and blueberries and milk (skim or 1% lactaid) L: bowl of soup and salad or antipasta salad with ham and cheese D: may go out and get pasta. - hasn't eaten red meat in a while. Pasta or salad - white pasta, he also like putanesca pasta with lots of vegetables. Salads: romaine  with ranch dressing and sometimes a greek salad. Drink: coffee and water or seltzer. He reports no longer on potassium restriction. He reports still losing weight. He will have some peanut butter and crackers Discussed heart healthy eating as well as high protein MNT to meet needs.      Intervention Plan   Intervention Prescribe, educate and counsel regarding individualized specific dietary modifications aiming towards targeted core components such as weight, hypertension, lipid management, diabetes, heart failure and other comorbidities.;Nutrition handout(s) given to patient.    Expected Outcomes Short Term Goal: Understand basic principles of dietary content, such as calories, fat, sodium, cholesterol and nutrients.;Short Term Goal: A plan has been developed with personal nutrition goals set during dietitian appointment.;Long Term Goal: Adherence to prescribed nutrition plan.           Nutrition Assessments:  Nutrition Assessments - 01/12/20 1505      MEDFICTS Scores   Pre Score 13          MEDIFICTS Score Key:  ?70 Need to make dietary changes   40-70 Heart Healthy Diet  ? 40 Therapeutic Level Cholesterol Diet   Picture Your Plate Scores:  <25 Unhealthy dietary pattern with much room for improvement.  41-50 Dietary pattern unlikely to meet recommendations for good health and room for improvement.  51-60 More healthful dietary pattern, with some room for improvement.   >60 Healthy dietary pattern, although there may be some specific behaviors that could be improved.   Nutrition Goals Re-Evaluation:  Nutrition Goals Re-Evaluation    [place]  Name 09/09/19 1355 09/28/19 1415 10/26/19 1441         Goals   Nutrition Goal ST: substantial snack before and after exercise (bean salad) and bring applesauce to rehab LT: Eat for heart and diabetes ST: substantial snack before and after exercise (bean salad) and bring applesauce to rehab, try to add greens to every dinner LT: Eat for heart and diabetes Better snacks and add more greens     Comment Continue to follow RD changes and recommendations Discussed how to add more greens to meals as pt reports struggling with this. Devin [name] is planning to continue to follow changes that he has already made.  He is working on snacking and adding in greens.     Expected Outcome Short: Follow regular changes Long: Maintain heart healthy diet ST: substantial snack before and after exercise (bean salad) and bring applesauce to rehab, try to add greens to every dinner Continue to follow heart healthy changes already made.            Nutrition Goals Discharge (Final Nutrition Goals Re-Evaluation):  Nutrition Goals Re-Evaluation - 10/26/19 1441      Goals   Nutrition Goal Better snacks and add more greens    Comment Devin [name] is planning to continue to follow changes that he has already made.  He is working on snacking and adding in greens.    Expected Outcome Continue to follow heart healthy changes already made.           Psychosocial: Target Goals: Acknowledge presence or absence of significant depression and/or stress, maximize coping skills, provide positive support system. Participant is able to verbalize types and ability to use techniques and skills needed for reducing stress and depression.   Education: Stress, Anxiety, and Depression - Group verbal and visual presentation to define topics covered.  Reviews how body is impacted by stress, anxiety, and depression.  Also discusses healthy ways to reduce stress and to treat/manage  anxiety and depression.  Written material given at graduation.   Education:  Sleep Hygiene -Provides group verbal and written instruction about how sleep can affect your health.  Define sleep hygiene, discuss sleep cycles and impact of sleep habits. Review good sleep hygiene tips.    Initial Review & Psychosocial Screening:  Initial Psych Review & Screening - 01/13/20 1406      Initial Review   Current issues with Current Stress Concerns    Source of Stress Concerns Chronic Illness      Family Dynamics   Good Support System? No   brother lives out of town, neighbors help when they can     Barriers   Psychosocial barriers to participate in program There are no identifiable barriers or psychosocial needs.;The patient should benefit from training in stress management and relaxation.      Screening Interventions   Interventions To provide support and resources with identified psychosocial needs;Provide feedback about the scores to participant;Encouraged to exercise    Expected Outcomes Short Term goal: Utilizing psychosocial counselor, staff and physician to assist with identification of specific Stressors or current issues interfering with healing process. Setting desired goal for each stressor or current issue identified.;Long Term Goal: Stressors or current issues are controlled or eliminated.;Short Term goal: Identification and review with participant of any Quality of Life or Depression concerns found by scoring the questionnaire.;Long Term goal: The participant improves quality of Life and PHQ9 Scores as seen by post scores and/or verbalization of changes           Quality of Life Scores:  Quality of Life - 10/28/19 1424      Quality of Life Scores   Health/Function Pre 26.57 %    Health/Function Post 24.23 %    Health/Function % Change -8.81 %    Socioeconomic Pre 22.63 %    Socioeconomic Post 20 %    Socioeconomic % Change  -11.62 %    Psych/Spiritual Pre 29.14 %    Psych/Spiritual Post 24.2 %    Psych/Spiritual % Change -16.95 %    Family Pre 17.67  %    Family Post 13 %    Family % Change -26.43 %    GLOBAL Pre 25.72 %    GLOBAL Post 23.12 %    GLOBAL % Change -10.11 %          Scores of 19 and below usually indicate a poorer quality of life in these areas.  A difference of  2-3 points is a clinically meaningful difference.  A difference of 2-3 points in the total score of the Quality of Life Index has been associated with significant improvement in overall quality of life, self-image, physical symptoms, and general health in studies assessing change in quality of life.  PHQ-9: Recent Review Flowsheet Data    Depression screen [facility] 2/9 01/12/2020 10/28/2019 09/09/2019 07/02/2019   Decreased Interest 3 2 1 2    Down, Depressed, Hopeless 0 0 0 1   PHQ - 2 Score 3 2 1 3    Altered sleeping 1  3 3 3    Tired, decreased energy 2 2 0 2   Change in appetite 1  1 0 1   Feeling bad or failure about yourself  0 0 0 0   Trouble concentrating 0 1 0 0   Moving slowly or fidgety/restless 0 1 0 0   Suicidal thoughts 1  0 0 0   PHQ-9 Score 8 10 4 9    Difficult doing work/chores  Not difficult at all Somewhat difficult Not difficult at all Not difficult at all     Interpretation of Total Score  Total Score Depression Severity:  1-4 = Minimal depression, 5-9 = Mild depression, 10-14 = Moderate depression, 15-19 = Moderately severe depression, 20-27 = Severe depression   Psychosocial Evaluation and Intervention:  Psychosocial Evaluation - 01/13/20 1407      Psychosocial Evaluation & Interventions   Interventions Stress management education;Relaxation education;Encouraged to exercise with the program and follow exercise prescription    Comments Devin [name] is returning to rehab for pulmonary. He was doing well after graduation until his low sodium caused walking issues, hallucinations,etc. He was hospitalized and treated. He still has follow up labs coming up. Some stress caused by this and he has felt a decline in his stamina/mobility. He is wanting to come  back to help gain back what he's lost    Expected Outcomes Short: attend pulmonary rehab for education and exercise. Long: develop and maintain positive self care habits.    Continue Psychosocial Services  Follow up required by staff           Psychosocial Re-Evaluation:  Psychosocial Re-Evaluation    [place] Name 08/19/19 1349 09/09/19 1353           Psychosocial Re-Evaluation   Current issues with Current Sleep Concerns;Current Stress Concerns Current Sleep Concerns;Current Stress Concerns      Comments Devin [name] is doing better mentally.  He is sleeping better now.  He enjoys getting out to meet people and coming to class. Devin [name] reports he doing well mentally. He is not sleeping to well due to his sleep apnea but thinks it has been better since the beginning. Denies any big stressors in his life. Stays compliant with antidepressants and reports making him feel well. PHQ imoroved by 5 points since last time!      Expected Outcomes Short: Continue to attend rehab for mental boost Long; Continue to stay positive Short: Focus on maintaining regular sleep patterns, continue to exercise for mental health Long: Maintain positive attitude      Interventions Encouraged to attend Cardiac Rehabilitation for the exercise Encouraged to attend Cardiac Rehabilitation for the exercise      Continue Psychosocial Services  Follow up required by staff Follow up required by staff             Psychosocial Discharge (Final Psychosocial Re-Evaluation):  Psychosocial Re-Evaluation - 09/09/19 1353      Psychosocial Re-Evaluation   Current issues with Current Sleep Concerns;Current Stress Concerns    Comments Devin [name] reports he doing well mentally. He is not sleeping to well due to his sleep apnea but thinks it has been better since the beginning. Denies any big stressors in his life. Stays compliant with antidepressants and reports making him feel well. PHQ imoroved by 5 points since last time!    Expected Outcomes Short:  Focus on maintaining regular sleep patterns, continue to exercise for mental health Long: Maintain positive attitude    Interventions Encouraged to attend Cardiac Rehabilitation for the exercise    Continue Psychosocial Services  Follow up required by staff           Education: Education Goals: Education classes will be provided on a weekly basis, covering required topics. Participant will state understanding/return demonstration of topics presented.  Learning Barriers/Preferences:  Learning Barriers/Preferences - 01/13/20 1406      Learning Barriers/Preferences   Learning Barriers None    Learning Preferences None  General Pulmonary Education Topics:  Infection Prevention: - Provides verbal and written material to individual with discussion of infection control including proper hand washing and proper equipment cleaning during exercise session.   Cardiac Rehab from 01/13/2020 in [facility] Cardiac and Pulmonary Rehab  Date 01/12/20  Educator AS  Instruction Review Code 1- Verbalizes Understanding      Falls Prevention: - Provides verbal and written material to individual with discussion of falls prevention and safety.   Cardiac Rehab from 01/13/2020 in [facility] Cardiac and Pulmonary Rehab  Date 01/12/20  Educator AS  Instruction Review Code 1- Verbalizes Understanding      Chronic Lung Disease Review: - Group verbal instruction with posters, models, PowerPoint presentations and videos,  to review new updates, new respiratory medications, new advancements in procedures and treatments. Providing information on websites and "800" numbers for continued self-education. Includes information about supplement oxygen, available portable oxygen systems, continuous and intermittent flow rates, oxygen safety, concentrators, and Medicare reimbursement for oxygen. Explanation of Pulmonary Drugs, including class, frequency, complications, importance of spacers, rinsing mouth after steroid  MDI's, and proper cleaning methods for nebulizers. Review of basic lung anatomy and physiology related to function, structure, and complications of lung disease. Review of risk factors. Discussion about methods for diagnosing sleep apnea and types of masks and machines for OSA. Includes a review of the use of types of environmental controls: home humidity, furnaces, filters, dust mite/pet prevention, HEPA vacuums. Discussion about weather changes, air quality and the benefits of nasal washing. Instruction on Warning signs, infection symptoms, calling MD promptly, preventive modes, and value of vaccinations. Review of effective airway clearance, coughing and/or vibration techniques. Emphasizing that all should Create an Action Plan. Written material given at graduation.   AED/CPR: - Group verbal and written instruction with the use of models to demonstrate the basic use of the AED with the basic ABC's of resuscitation.    Anatomy and Cardiac Procedures: - Group verbal and visual presentation and models provide information about basic cardiac anatomy and function. Reviews the testing methods done to diagnose heart disease and the outcomes of the test results. Describes the treatment choices: Medical Management, Angioplasty, or Coronary Bypass Surgery for treating various heart conditions including Myocardial Infarction, Angina, Valve Disease, and Cardiac Arrhythmias.  Written material given at graduation.   Medication Safety: - Group verbal and visual instruction to review commonly prescribed medications for heart and lung disease. Reviews the medication, class of the drug, and side effects. Includes the steps to properly store meds and maintain the prescription regimen.  Written material given at graduation.   Cardiac Rehab from 10/07/2019 in [facility] Cardiac and Pulmonary Rehab  Date 10/07/19  Educator SB  Instruction Review Code 1- Verbalizes Understanding      Other: -Provides group and verbal  instruction on various topics (see comments)   Knowledge Questionnaire Score:  Knowledge Questionnaire Score - 01/12/20 1503      Knowledge Questionnaire Score   Pre Score 13/18 oxygen            Core Components/Risk Factors/Patient Goals at Admission:  Personal Goals and Risk Factors at Admission - 01/13/20 1405      Core Components/Risk Factors/Patient Goals on Admission    Weight Management Yes;Weight Loss    Intervention Weight Management: Develop a combined nutrition and exercise program designed to reach desired caloric intake, while maintaining appropriate intake of nutrient and fiber, sodium and fats, and appropriate energy expenditure required for the weight goal.;Weight Management: Provide education and appropriate  resources to help participant work on and attain dietary goals.    Expected Outcomes Short Term: Continue to assess and modify interventions until short term weight is achieved;Long Term: Adherence to nutrition and physical activity/exercise program aimed toward attainment of established weight goal;Weight Loss: Understanding of general recommendations for a balanced deficit meal plan, which promotes 1-2 lb weight loss per week and includes a negative energy balance of [phone] kcal/d;Understanding recommendations for meals to include 15-35% energy as protein, 25-35% energy from fat, 35-60% energy from carbohydrates, less than $RemoveB'200mg'GjyHLRds$  of dietary cholesterol, 20-35 gm of total fiber daily;Understanding of distribution of calorie intake throughout the day with the consumption of 4-5 meals/snacks    Diabetes Yes    Intervention Provide education about signs/symptoms and action to take for hypo/hyperglycemia.;Provide education about proper nutrition, including hydration, and aerobic/resistive exercise prescription along with prescribed medications to achieve blood glucose in normal ranges: Fasting glucose 65-99 mg/dL    Expected Outcomes Short Term: Participant verbalizes  understanding of the signs/symptoms and immediate care of hyper/hypoglycemia, proper foot care and importance of medication, aerobic/resistive exercise and nutrition plan for blood glucose control.;Long Term: Attainment of HbA1C < 7%.    Heart Failure Yes    Intervention Provide a combined exercise and nutrition program that is supplemented with education, support and counseling about heart failure. Directed toward relieving symptoms such as shortness of breath, decreased exercise tolerance, and extremity edema.    Expected Outcomes Improve functional capacity of life;Short term: Attendance in program 2-3 days a week with increased exercise capacity. Reported lower sodium intake. Reported increased fruit and vegetable intake. Reports medication compliance.;Short term: Daily weights obtained and reported for increase. Utilizing diuretic protocols set by physician.;Long term: Adoption of self-care skills and reduction of barriers for early signs and symptoms recognition and intervention leading to self-care maintenance.    Hypertension Yes    Intervention Provide education on lifestyle modifcations including regular physical activity/exercise, weight management, moderate sodium restriction and increased consumption of fresh fruit, vegetables, and low fat dairy, alcohol moderation, and smoking cessation.;Monitor prescription use compliance.    Expected Outcomes Short Term: Continued assessment and intervention until BP is < 140/18mm HG in hypertensive participants. < 130/38mm HG in hypertensive participants with diabetes, heart failure or chronic kidney disease.;Long Term: Maintenance of blood pressure at goal levels.    Lipids Yes    Intervention Provide education and support for participant on nutrition & aerobic/resistive exercise along with prescribed medications to achieve LDL '70mg'$ , HDL >$Remo'40mg'CoPSO$ .    Expected Outcomes Short Term: Participant states understanding of desired cholesterol values and is compliant  with medications prescribed. Participant is following exercise prescription and nutrition guidelines.;Long Term: Cholesterol controlled with medications as prescribed, with individualized exercise RX and with personalized nutrition plan. Value goals: LDL < $Rem'70mg'elps$ , HDL > 40 mg.           Education:Diabetes - Individual verbal and written instruction to review signs/symptoms of diabetes, desired ranges of glucose level fasting, after meals and with exercise. Acknowledge that pre and post exercise glucose checks will be done for 3 sessions at entry of program.   Cardiac Rehab from 01/13/2020 in [facility] Cardiac and Pulmonary Rehab  Date 01/13/20  Educator [facility]  Instruction Review Code 1- Verbalizes Understanding      Know Your Numbers and Heart Failure: - Group verbal and visual instruction to discuss disease risk factors for cardiac and pulmonary disease and treatment options.  Reviews associated critical values for Overweight/Obesity, Hypertension, Cholesterol, and Diabetes.  Discusses  basics of heart failure: signs/symptoms and treatments.  Introduces Heart Failure Zone chart for action plan for heart failure.  Written material given at graduation.   Core Components/Risk Factors/Patient Goals Review:   Goals and Risk Factor Review    [place] Name 08/19/19 1350 09/09/19 1343 10/26/19 1442         Core Components/Risk Factors/Patient Goals Review   Personal Goals Review Weight Management/Obesity;Hypertension;Diabetes;Lipids Weight Management/Obesity;Hypertension;Diabetes;Lipids Weight Management/Obesity;Hypertension;Diabetes;Lipids     Review Devin [name] is doing well in rehab.  His weight is trending down.  He keeps a good eye on his pressures and sugars.  His pressures have continued to do well.  When he first started, his sugars were dropping with exercise, now he has made changes and is staying stable. Devin [name] would like to lose weight but is struggling to do so as he said his bowel movements are not regular, he  plans to talk to his doctor about it at his upcoming appointment. Reports that he takes his blood sugars at home but does not do it as much as he would like to, reports normal ranges at home. He will plan to check more often and spoke about setting up alarms to remind him. Devin [name] states his BPs have been stable here at rehab. He received a recent blood work and reports his A1C went down! Devin [name] will continue to work on his weight and watching his numbers.  He feels he has been control and a good plan going forward.  He plans to continue to work on weight loss with exercise and diet.     Expected Outcomes Short: Continue to work on weight loss Long; Continue to manage diabetes better. Short: Continue to check BG more often, focus on losing weight/speaking with doctor Long: Manage risk factors Continue to exercise and monitor risk factors.            Core Components/Risk Factors/Patient Goals at Discharge (Final Review):   Goals and Risk Factor Review - 10/26/19 1442      Core Components/Risk Factors/Patient Goals Review   Personal Goals Review Weight Management/Obesity;Hypertension;Diabetes;Lipids    Review Devin [name] will continue to work on his weight and watching his numbers.  He feels he has been control and a good plan going forward.  He plans to continue to work on weight loss with exercise and diet.    Expected Outcomes Continue to exercise and monitor risk factors.           ITP Comments:  ITP Comments    [place] Name 08/26/19 0629 09/23/19 1053 10/21/19 0631 11/11/19 0827 01/13/20 0705   ITP Comments 30 Day review completed. Medical Director ITP review done, changes made as directed, and signed approval by Medical Director. 30 Day review completed. Medical Director ITP review done, changes made as directed, and signed approval by Medical Director. 30 Day review completed. Medical Director ITP review done, changes made as directed, and signed approval by Medical Director. Discharge ITP sent and signed by  Dr. Sabra Heck.  Discharge Summary routed to PCP and cardiologist. 30 Day review completed. Medical Director ITP review done, changes made as directed, and signed approval by Medical Director.   [place] Name 01/18/20 1630 02/03/20 1718 02/10/20 1038       ITP Comments First full day of exercise!  Patient was oriented to gym and equipment including functions, settings, policies, and procedures.  Patient's individual exercise prescription and treatment plan were reviewed.  All starting workloads were established based on the results of the 6 minute  walk test done at initial orientation visit.  The plan for exercise progression was also introduced and progression will be customized based on patient's performance and goals. Spoke with patient who reported he is experiencing sciatic pain. He was prescribed Prednisone which he feels is helping- he reports he will be out next week for [facility] and is not sure when he will be back. Patient will plan to call us next week to keep [place] updated. 30 Day review completed. Medical Director ITP review done, changes made as directed, and signed approval by Medical Director.            Comments:

## 2020-02-11 ENCOUNTER — Other Ambulatory Visit: Payer: Self-pay | Admitting: Neurology

## 2020-02-11 DIAGNOSIS — M5416 Radiculopathy, lumbar region: Secondary | ICD-10-CM

## 2020-02-16 ENCOUNTER — Ambulatory Visit: Admission: RE | Admit: 2020-02-16 | Payer: Medicare HMO | Source: Ambulatory Visit

## 2020-02-17 ENCOUNTER — Telehealth: Payer: Self-pay

## 2020-02-17 NOTE — Telephone Encounter (Signed)
[name] is still waiting to find out how his Dr plans to follow up.  He states his back is about the same.  He doesn't feel he can exercise at this time.  He will call back when he finds out what is causing the pain.

## 2020-02-22 ENCOUNTER — Other Ambulatory Visit: Payer: Self-pay | Admitting: Neurology

## 2020-02-22 DIAGNOSIS — M5416 Radiculopathy, lumbar region: Secondary | ICD-10-CM

## 2020-02-23 ENCOUNTER — Encounter: Payer: Self-pay | Admitting: *Deleted

## 2020-02-23 DIAGNOSIS — I5022 Chronic systolic (congestive) heart failure: Secondary | ICD-10-CM

## 2020-02-29 ENCOUNTER — Other Ambulatory Visit: Payer: Self-pay | Admitting: Neurology

## 2020-02-29 DIAGNOSIS — M5416 Radiculopathy, lumbar region: Secondary | ICD-10-CM

## 2020-03-01 ENCOUNTER — Ambulatory Visit: Admission: RE | Admit: 2020-03-01 | Payer: Medicare HMO | Source: Ambulatory Visit

## 2020-03-03 ENCOUNTER — Ambulatory Visit
Admission: RE | Admit: 2020-03-03 | Discharge: 2020-03-03 | Disposition: A | Discharge status: Home / Self Care | Payer: Medicare HMO | Source: Ambulatory Visit | Attending: Neurology | Admitting: Neurology

## 2020-03-03 ENCOUNTER — Other Ambulatory Visit: Payer: Self-pay

## 2020-03-03 DIAGNOSIS — M5416 Radiculopathy, lumbar region: Secondary | ICD-10-CM | POA: Diagnosis not present

## 2020-03-09 ENCOUNTER — Encounter: Payer: Self-pay | Admitting: *Deleted

## 2020-03-09 ENCOUNTER — Telehealth: Payer: Self-pay

## 2020-03-09 DIAGNOSIS — I5022 Chronic systolic (congestive) heart failure: Secondary | ICD-10-CM

## 2020-03-09 NOTE — Progress Notes (Signed)
Pulmonary Individual Treatment Plan  Patient Details  Name: Devin [name]. MRN: [id number] Date of Birth: 19-Oct-1945 Referring Provider:   Flowsheet Row Pulmonary Rehab from 01/12/2020 in [facility] Cardiac and Pulmonary Rehab  Referring Provider Devin [name]      Initial Encounter Date:  Flowsheet Row Pulmonary Rehab from 01/12/2020 in [facility] Cardiac and Pulmonary Rehab  Date 01/12/20      Visit Diagnosis: Heart failure, chronic systolic (HCC)  Patient's [name] Medications on Admission:  Current Outpatient Medications:  .  amiodarone (PACERONE) 400 MG tablet, Take 1 tablet (400 mg total) by mouth daily. (Patient not taking: Reported on 01/11/2020), Disp: 30 tablet, Rfl: 0 .  apixaban (ELIQUIS) 5 MG TABS tablet, Take 5 mg by mouth 2 (two) times daily. , Disp: , Rfl:  .  atorvastatin (LIPITOR) 80 MG tablet, Take 1 tablet (80 mg total) by mouth daily., Disp: 30 tablet, Rfl: 11 .  buPROPion (WELLBUTRIN XL) 300 MG 24 hr tablet, Take 300 mg by mouth daily. , Disp: , Rfl:  .  carvedilol (COREG) 25 MG tablet, Take 1 tablet (25 mg total) by mouth 2 (two) times daily., Disp: 60 tablet, Rfl: 11 .  ENTRESTO 49-51 MG, Take 1 tablet by mouth 2 (two) times daily., Disp: , Rfl:  .  EPINEPHrine 0.3 mg/0.3 mL IJ SOAJ injection, Inject 0.3 mg into the muscle as needed for anaphylaxis., Disp: , Rfl:  .  ezetimibe (ZETIA) 10 MG tablet, Take 10 mg by mouth daily., Disp: , Rfl:  .  furosemide (LASIX) 20 MG tablet, Take 1 tablet (20 mg total) by mouth daily. (Patient not taking: Reported on 01/11/2020), Disp: 30 tablet, Rfl: 11 .  gabapentin (NEURONTIN) 300 MG capsule, Take 300 mg by mouth 2 (two) times daily., Disp: , Rfl:  .  metFORMIN (GLUCOPHAGE) 500 MG tablet, Take 500 mg by mouth 2 (two) times daily., Disp: , Rfl:  .  nitroGLYCERIN (NITROSTAT) 0.4 MG SL tablet, Place 0.4 mg under the tongue every 5 (five) minutes as needed for chest pain., Disp: , Rfl:  .  sertraline (ZOLOFT) 25 MG tablet, Take 25 mg by mouth  daily., Disp: , Rfl:  .  sildenafil (VIAGRA) 50 MG tablet, Take 50 mg by mouth daily as needed for erectile dysfunction., Disp: , Rfl:  No current facility-administered medications for this visit.  Facility-Administered Medications Ordered in Other Visits:  .  sodium chloride flush (NS) 0.9 % injection 3 mL, 3 mL, Intravenous, Q12H, Devin Nancy, MD  Past Medical History: Past Medical History:  Diagnosis Date  . Arrhythmia    atrial fibrillation  . CAD (coronary artery disease)   . CHF (congestive heart failure) (HCC)   . Diabetes mellitus without complication (HCC)   . Hypertension   . MI, old   . Sleep apnea     Tobacco Use: Social History   Tobacco Use  Smoking Status Former Smoker  Smokeless Tobacco Never Used  Tobacco Comment   Quit over 40 years ago    Labs: Recent Review [employer] for [facility] Cardiac and Pulmonary Rehab Latest Ref Rng & Units 05/16/2019 12/06/2019   Hemoglobin A1c 4.8 - 5.6 % 5.8(H) 6.0(H)       Pulmonary Assessment Scores:  Pulmonary Assessment Scores    Row Name 01/12/20 1503         ADL UCSD   SOB Score total 34     Rest 0     Walk 1  Stairs 4     Bath 1     Dress 1     Shop 1           CAT Score   CAT Score 2           mMRC Score   mMRC Score 1            UCSD: Self-administered rating of dyspnea associated with activities of daily living (ADLs) 6-point scale (0 = "not at all" to 5 = "maximal or unable to do because of breathlessness")  Scoring Scores range from 0 to 120.  Minimally important difference is 5 units  CAT: CAT can identify the health impairment of COPD patients and is better correlated with disease progression.  CAT has a scoring range of zero to 40. The CAT score is classified into four groups of low (less than 10), medium (10 - 20), high (21-30) and very high (31-40) based on the impact level of disease on health status. A CAT score over 10 suggests significant symptoms.  A worsening CAT  score could be explained by an exacerbation, poor medication adherence, poor inhaler technique, or progression of COPD or comorbid conditions.  CAT MCID is 2 points  mMRC: mMRC (Modified Medical Research Council) Dyspnea Scale is used to assess the degree of baseline functional disability in patients of respiratory disease due to dyspnea. No minimal important difference is established. A decrease in score of 1 point or greater is considered a positive change.   Pulmonary Function Assessment:   Exercise Target Goals: Exercise Program Goal: Individual exercise prescription set using results from initial 6 min walk test and THRR while considering  patient's activity barriers and safety.   Exercise Prescription Goal: Initial exercise prescription builds to 30-45 minutes a day of aerobic activity, 2-3 days per week.  [name] exercise guidelines will be given to patient during program as part of exercise prescription that the participant will acknowledge.  Education: Aerobic Exercise: - Group verbal and visual presentation on the components of exercise prescription. Introduces F.I.T.T principle from ACSM for exercise prescriptions.  Reviews F.I.T.T. principles of aerobic exercise including progression. Written material given at graduation.   Education: Resistance Exercise: - Group verbal and visual presentation on the components of exercise prescription. Introduces F.I.T.T principle from ACSM for exercise prescriptions  Reviews F.I.T.T. principles of resistance exercise including progression. Written material given at graduation.    Education: Exercise & Equipment Safety: - Individual verbal instruction and demonstration of equipment use and safety with use of the equipment. Flowsheet Row Cardiac Rehab from 01/13/2020 in [facility] Cardiac and Pulmonary Rehab  Date 01/12/20  Educator AS  Instruction Review Code 1- Verbalizes Understanding      Education: Exercise Physiology & General Exercise  Guidelines: - Group verbal and written instruction with models to review the exercise physiology of the cardiovascular system and associated critical values. Provides general exercise guidelines with specific guidelines to those with heart or lung disease.  Flowsheet Row Cardiac Rehab from 10/07/2019 in [facility] Cardiac and Pulmonary Rehab  Date 09/23/19  Educator AS  Instruction Review Code 1- Verbalizes Understanding      Education: Flexibility, Balance, Mind/Body Relaxation: - Group verbal and visual presentation with interactive activity on the components of exercise prescription. Introduces F.I.T.T principle from ACSM for exercise prescriptions. Reviews F.I.T.T. principles of flexibility and balance exercise training including progression. Also discusses the mind body connection.  Reviews various relaxation techniques to help reduce and manage stress (i.e. Deep breathing, progressive muscle  relaxation, and visualization). Balance handout provided to take [name]. Written material given at graduation.   Activity Barriers & Risk Stratification:   6 Minute Walk:  6 Minute Walk    Row Name 10/26/19 1419 01/12/20 1455       6 Minute Walk   Phase Discharge Initial    Distance -- 680 feet    Distance % Change 1.38 % --    Distance Feet Change 15 ft --    Walk Time 6 minutes 6 minutes    # of Rest Breaks 0 0    MPH 2.08 1.28    METS 2.25 1.3    RPE 11 13    Perceived Dyspnea  -- 1    VO2 Peak 7.87 4.57    Symptoms No No    Resting HR 80 bpm 61 bpm    Resting BP 130/64 136/60    Resting Oxygen Saturation  -- 95 %    Exercise Oxygen Saturation  during 6 min walk -- 94 %    Max Ex. HR 92 bpm 74 bpm    Max Ex. BP 142/72 124/66    2 Minute Post BP -- 126/66         Interval HR   1 Minute HR -- 65    2 Minute HR -- 74    3 Minute HR -- 69    4 Minute HR -- 67    5 Minute HR -- 66    6 Minute HR -- 68    2 Minute Post HR -- 64    Interval Heart Rate? -- Yes         Interval Oxygen    Interval Oxygen? -- Yes    Baseline Oxygen Saturation % -- 95 %    1 Minute Oxygen Saturation % -- 94 %    1 Minute Liters of Oxygen -- 0 L    2 Minute Oxygen Saturation % -- 95 %    2 Minute Liters of Oxygen -- 0 L    3 Minute Oxygen Saturation % -- 94 %    3 Minute Liters of Oxygen -- 0 L    4 Minute Oxygen Saturation % -- 95 %    4 Minute Liters of Oxygen -- 0 L    5 Minute Oxygen Saturation % -- 96 %    5 Minute Liters of Oxygen -- 0 L    6 Minute Oxygen Saturation % -- 96 %    6 Minute Liters of Oxygen -- 0 L    2 Minute Post Oxygen Saturation % -- 95 %    2 Minute Post Liters of Oxygen -- 0 L          Oxygen Initial Assessment:   Oxygen Re-Evaluation:  Oxygen Re-Evaluation    Row Name 01/18/20 1632             Program Oxygen Prescription   Program Oxygen Prescription None               [name] Oxygen   [name] Oxygen Device None       [name] Exercise Oxygen Prescription None       [name] Resting Oxygen Prescription None               Goals/Expected Outcomes   Short Term Goals To learn and understand importance of monitoring SPO2 with pulse oximeter and demonstrate accurate use of the pulse oximeter.;To learn and understand importance of maintaining oxygen saturations>88%;To learn and  demonstrate proper pursed lip breathing techniques or other breathing techniques.;To learn and demonstrate proper use of respiratory medications;Other       Long  Term Goals Verbalizes importance of monitoring SPO2 with pulse oximeter and return demonstration;Maintenance of O2 saturations>88%;Exhibits proper breathing techniques, such as pursed lip breathing or other method taught during program session;Compliance with respiratory medication       Comments Reviewed PLB technique with pt.  Talked about how it works and it's importance in maintaining their exercise saturations.       Goals/Expected Outcomes Short: Become more profiecient at using PLB.   Long: Become independent at using PLB.               Oxygen Discharge (Final Oxygen Re-Evaluation):  Oxygen Re-Evaluation - 01/18/20 1632      Program Oxygen Prescription   Program Oxygen Prescription None      [name] Oxygen   [name] Oxygen Device None    [name] Exercise Oxygen Prescription None    [name] Resting Oxygen Prescription None      Goals/Expected Outcomes   Short Term Goals To learn and understand importance of monitoring SPO2 with pulse oximeter and demonstrate accurate use of the pulse oximeter.;To learn and understand importance of maintaining oxygen saturations>88%;To learn and demonstrate proper pursed lip breathing techniques or other breathing techniques.;To learn and demonstrate proper use of respiratory medications;Other    Long  Term Goals Verbalizes importance of monitoring SPO2 with pulse oximeter and return demonstration;Maintenance of O2 saturations>88%;Exhibits proper breathing techniques, such as pursed lip breathing or other method taught during program session;Compliance with respiratory medication    Comments Reviewed PLB technique with pt.  Talked about how it works and it's importance in maintaining their exercise saturations.    Goals/Expected Outcomes Short: Become more profiecient at using PLB.   Long: Become independent at using PLB.           Initial Exercise Prescription:  Initial Exercise Prescription - 01/12/20 1400      Date of Initial Exercise RX and Referring Provider   Date 01/12/20    Referring Provider Devin [name]      Treadmill   MPH 1    Grade 0.5    Minutes 15    METs 1.83      Recumbant Bike   Level 1    Minutes 15    METs 1.5      NuStep   Level 1    SPM 80    Minutes 15    METs 1.5      Recumbant Elliptical   Level 1    RPM 50    Minutes 15    METs 1.5      REL-XR   Level 1    Watts 50    Minutes 15    METs 1.5      Biostep-RELP   Level 1    SPM 50    Minutes 15    METs 1.5      Prescription Details   Frequency (times per week) 3    Duration Progress  to 30 minutes of continuous aerobic without signs/symptoms of physical distress      Intensity   THRR 40-80% of Max Heartrate 95-128    Ratings of Perceived Exertion 11-15    Perceived Dyspnea 0-4      Resistance Training   Training Prescription Yes    Weight 3 lb    Reps 10-15  Perform Capillary Blood Glucose checks as needed.  Exercise Prescription Changes:  Exercise Prescription Changes    Row Name 09/22/19 1300 10/06/19 1300 10/20/19 1300 11/03/19 1300 01/12/20 1500     Response to Exercise   Blood Pressure (Admit) 110/72 110/60 146/74 118/62 136/60   Blood Pressure (Exercise) 152/70 180/88 162/76 142/76 124/66   Blood Pressure (Exit) 104/66 118/70 128/74 100/60 126/66   Heart Rate (Admit) 71 bpm 70 bpm 75 bpm 70 bpm 61 bpm   Heart Rate (Exercise) 88 bpm 90 bpm 90 bpm 109 bpm 74 bpm   Heart Rate (Exit) 64 bpm 69 bpm 73 bpm 103 bpm 64 bpm   Rating of Perceived Exertion (Exercise) 15 17 15 13 13    Perceived Dyspnea (Exercise) -- -- -- -- 1   Symptoms -- none none none --   Duration Continue with 30 min of aerobic exercise without signs/symptoms of physical distress. Continue with 30 min of aerobic exercise without signs/symptoms of physical distress. Continue with 30 min of aerobic exercise without signs/symptoms of physical distress. Continue with 30 min of aerobic exercise without signs/symptoms of physical distress. --   Intensity THRR unchanged THRR unchanged THRR unchanged THRR unchanged --     Progression   Progression Continue to progress workloads to maintain intensity without signs/symptoms of physical distress. Continue to progress workloads to maintain intensity without signs/symptoms of physical distress. Continue to progress workloads to maintain intensity without signs/symptoms of physical distress. Continue to progress workloads to maintain intensity without signs/symptoms of physical distress. --   Average METs 2.75 3.29 3 3.53 --     Resistance  Training   Training Prescription Yes Yes Yes Yes --   Weight 5 lb 5 lb 5 lb 5 lb --   Reps 10-15 10-15 10-15 10-15 --     Interval Training   Interval Training No No No No --     Treadmill   MPH 2.6 2.6 2.3 2.3 --   Grade 0.5 0.5 0.5 0 --   Minutes 15 15 15 15  --   METs 3.17 3.17 2.92 2.76 --     NuStep   Level 5 -- -- -- --   SPM 80 -- -- -- --   Minutes 15 -- -- -- --   METs 2.3 -- -- -- --     REL-XR   Level -- 5 6 6  --   Watts -- -- 50 -- --   Minutes -- 15 15 15  --   METs -- 3.3 3 4.3 --     T5 Nustep   Level -- 5 -- -- --   Minutes -- 15 -- -- --   METs -- 3.4 -- -- --     [name] Exercise Plan   Plans to continue exercise at [name] (comment)  walking [name] (comment)  walking [name] (comment)  walking [name] (comment)  walking --   Frequency Add 2 additional days to program exercise sessions. Add 2 additional days to program exercise sessions. Add 2 additional days to program exercise sessions. Add 2 additional days to program exercise sessions. --   Initial [name] Exercises Provided 08/03/19 08/03/19 08/03/19 08/03/19 --   Row Name 01/25/20 1200             Response to Exercise   Blood Pressure (Admit) 106/60       Blood Pressure (Exercise) 112/56       Blood Pressure (Exit) 110/62       Heart Rate (Admit) 63 bpm  Heart Rate (Exercise) 70 bpm       Heart Rate (Exit) 67 bpm       Oxygen Saturation (Admit) 95 %       Oxygen Saturation (Exercise) 94 %       Oxygen Saturation (Exit) 97 %       Rating of Perceived Exertion (Exercise) 12       Perceived Dyspnea (Exercise) 0       Symptoms none       Comments second full day of exericse       Duration Continue with 30 min of aerobic exercise without signs/symptoms of physical distress.       Intensity THRR unchanged               Progression   Progression Continue to progress workloads to maintain intensity without signs/symptoms of physical distress.       Average METs 2.1               Resistance Training    Training Prescription Yes       Weight 5 lb       Reps 10-15               Interval Training   Interval Training No               Treadmill   MPH 1.7       Grade 0       Minutes 15       METs 2.3               REL-XR   Level 3       Minutes 15               T5 Nustep   Level 1       Minutes 15       METs 1.9              Exercise Comments:   Exercise Goals and Review:  Exercise Goals    Row Name 01/12/20 1501             Exercise Goals   Increase Physical Activity Yes       Intervention Provide advice, education, support and counseling about physical activity/exercise needs.;Develop an individualized exercise prescription for aerobic and resistive training based on initial evaluation findings, risk stratification, comorbidities and participant's personal goals.       Expected Outcomes Long Term: Add in [name] exercise to make exercise part of routine and to increase amount of physical activity.;Long Term: Exercising regularly at least 3-5 days a week.;Short Term: Attend rehab on a regular basis to increase amount of physical activity.       Intervention Provide advice, education, support and counseling about physical activity/exercise needs.;Develop an individualized exercise prescription for aerobic and resistive training based on initial evaluation findings, risk stratification, comorbidities and participant's personal goals.       Expected Outcomes Short Term: Increase workloads from initial exercise prescription for resistance, speed, and METs.;Short Term: Perform resistance training exercises routinely during rehab and add in resistance training at [name];Long Term: Improve cardiorespiratory fitness, muscular endurance and strength as measured by increased METs and functional capacity ( )       Able to understand and use rate of perceived exertion (RPE) scale Yes       Intervention Provide education and explanation on how to use RPE scale       Expected Outcomes  Short Term: Able  to use RPE daily in rehab to express subjective intensity level;Long Term:  Able to use RPE to guide intensity level when exercising independently       Able to understand and use Dyspnea scale Yes       Intervention Provide education and explanation on how to use Dyspnea scale       Expected Outcomes Short Term: Able to use Dyspnea scale daily in rehab to express subjective sense of shortness of breath during exertion;Long Term: Able to use Dyspnea scale to guide intensity level when exercising independently       Knowledge and understanding of Target Heart Rate Range (THRR) Yes       Intervention Provide education and explanation of THRR including how the numbers were predicted and where they are located for reference       Expected Outcomes Short Term: Able to state/look up THRR;Short Term: Able to use daily as guideline for intensity in rehab;Long Term: Able to use THRR to govern intensity when exercising independently       Able to check pulse independently Yes       Intervention Provide education and demonstration on how to check pulse in carotid and radial arteries.;Review the importance of being able to check your own pulse for safety during independent exercise       Expected Outcomes Short Term: Able to explain why pulse checking is important during independent exercise;Long Term: Able to check pulse independently and accurately       Intervention Provide education, explanation, and written materials on patient's individual exercise prescription       Expected Outcomes Short Term: Able to explain program exercise prescription;Long Term: Able to explain [name] exercise prescription to exercise independently              Exercise Goals Re-Evaluation :  Exercise Goals Re-Evaluation    Row Name 09/22/19 1320 10/06/19 1353 10/20/19 1353 10/26/19 1435 11/03/19 1354     Exercise Goal Re-Evaluation   Exercise Goals Review Increase Physical Activity;Increase Strength and  Stamina;Understanding of Exercise Prescription Increase Physical Activity;Increase Strength and Stamina;Understanding of Exercise Prescription Increase Physical Activity;Increase Strength and Stamina;Understanding of Exercise Prescription Increase Physical Activity;Increase Strength and Stamina;Understanding of Exercise Prescription Increase Physical Activity;Increase Strength and Stamina;Understanding of Exercise Prescription   Comments Ed is making steady progress.  he has increase to 2.6 mph on TM. Ed has been doing well in rehab.  He is now up to level 5 on the T5 NuStep and XR.  We will continue to monitor his progress. Ed continues to do well with exercise.  He is now on level 6 for XR and NS. Ed is set to graduate next week.  He improved his post some. He is planning to continue to exericse at [employer] in [place] using his Silver Sneakers. Ed was out on vacation this week.  He will graduate next week and go to [employer].   Expected Outcomes Short: attend consistently Long:  increase stamina Short: Continue to increase workloads Long; Continue to improve stamina. Short: Continue to increase workloads Long; Continue to improve stamina. Continue to exercise independently. Continue to exercise independently.   Row Name 01/18/20 1632 01/25/20 1253 02/23/20 1607         Exercise Goal Re-Evaluation   Exercise Goals Review Increase Physical Activity;Able to understand and use rate of perceived exertion (RPE) scale;Knowledge and understanding of Target Heart Rate Range (THRR);Understanding of Exercise Prescription;Increase Strength and Stamina;Able to check pulse independently;Able to understand  and use Dyspnea scale Increase Physical Activity;Increase Strength and Stamina;Understanding of Exercise Prescription --     Comments Reviewed RPE and dyspnea scales, THR and program prescription with pt today.  Pt voiced understanding and was given a copy of goals to take [name]. Ed has completed  his first two full days of exercise.  He is already at 1.9 METs on teh T5 NuStep.  We will continue to monitor his progress. Out since last review     Expected Outcomes Short: Use RPE daily to regulate intensity. Long: Follow program prescription in THR. Short: Continue to attend regularly  Long: Continue to follow program prescription --            Discharge Exercise Prescription (Final Exercise Prescription Changes):  Exercise Prescription Changes - 01/25/20 1200      Response to Exercise   Blood Pressure (Admit) 106/60    Blood Pressure (Exercise) 112/56    Blood Pressure (Exit) 110/62    Heart Rate (Admit) 63 bpm    Heart Rate (Exercise) 70 bpm    Heart Rate (Exit) 67 bpm    Oxygen Saturation (Admit) 95 %    Oxygen Saturation (Exercise) 94 %    Oxygen Saturation (Exit) 97 %    Rating of Perceived Exertion (Exercise) 12    Perceived Dyspnea (Exercise) 0    Symptoms none    Comments second full day of exericse    Duration Continue with 30 min of aerobic exercise without signs/symptoms of physical distress.    Intensity THRR unchanged      Progression   Progression Continue to progress workloads to maintain intensity without signs/symptoms of physical distress.    Average METs 2.1      Resistance Training   Training Prescription Yes    Weight 5 lb    Reps 10-15      Interval Training   Interval Training No      Treadmill   MPH 1.7    Grade 0    Minutes 15    METs 2.3      REL-XR   Level 3    Minutes 15      T5 Nustep   Level 1    Minutes 15    METs 1.9           Nutrition:  Target Goals: Understanding of nutrition guidelines, daily intake of sodium 1500mg , cholesterol 200mg , calories 30% from fat and 7% or less from saturated fats, daily to have 5 or more servings of fruits and vegetables.  Education: All About Nutrition: -Group instruction provided by verbal, written material, interactive activities, discussions, models, and posters to present general  guidelines for heart healthy nutrition including fat, fiber, MyPlate, the role of sodium in heart healthy nutrition, utilization of the nutrition label, and utilization of this knowledge for meal planning. Follow up email sent as well. Written material given at graduation.   Biometrics:  Pre Biometrics - 01/12/20 1502      Pre Biometrics   Height  (1.803 m)    Weight 199 lb 11.2 oz (90.6 kg)    BMI (Calculated) 27.86    Single Leg Stand 0 seconds           Post Biometrics - 10/26/19 1420       Post  Biometrics   Height 5' 11.1" (1.806 m)    Weight 208 lb (94.3 kg)    BMI (Calculated) 28.93    Single Leg Stand 3.26 seconds  Nutrition Therapy Plan and Nutrition Goals:  Nutrition Therapy & Goals - 01/25/20 1605      Nutrition Therapy   Diet Low Na, Heart Healthy, Diabetes Friendly, pulmonary MNT    Drug/Food Interactions Statins/Certain Fruits    Protein (specify units) 95g    Fiber 30 grams    Whole Grain Foods 3 servings    Saturated Fats 12 max. grams    Fruits and Vegetables 8 servings/day    Sodium 1.5 grams      Personal Nutrition Goals   Nutrition Goal ST: Protein (greek yogurt, cottage cheese, meat, fish, chicken, beans, eggs) with all meals, add two snacks before and after exercise (with protein and carbohydrates). LT: meet protein needs, gain muscle back from recent weight loss    Comments B: cheerios with banana and blueberries and milk (skim or 1% lactaid) L: bowl of soup and salad or antipasta salad with ham and cheese D: may go out and get pasta. - hasn't eaten red meat in a while. Pasta or salad - white pasta, he also like putanesca pasta with lots of vegetables. Salads: romaine with ranch dressing and sometimes a greek salad. Drink: coffee and water or seltzer. He reports no longer on potassium restriction. He reports still losing weight. He will have some peanut butter and crackers Discussed heart healthy eating as well as high protein MNT to  meet needs.      Intervention Plan   Intervention Prescribe, educate and counsel regarding individualized specific dietary modifications aiming towards targeted core components such as weight, hypertension, lipid management, diabetes, heart failure and other comorbidities.;Nutrition handout(s) given to patient.    Expected Outcomes Short Term Goal: Understand basic principles of dietary content, such as calories, fat, sodium, cholesterol and nutrients.;Short Term Goal: A plan has been developed with personal nutrition goals set during dietitian appointment.;Long Term Goal: Adherence to prescribed nutrition plan.           Nutrition Assessments:  Nutrition Assessments - 01/12/20 1505      MEDFICTS Scores   Pre Score 13          MEDIFICTS Score Key:  ?70 Need to make dietary changes   40-70 Heart Healthy Diet  ? 40 Therapeutic Level Cholesterol Diet   Picture Your Plate Scores:  <66 Unhealthy dietary pattern with much room for improvement.  41-50 Dietary pattern unlikely to meet recommendations for good health and room for improvement.  51-60 More healthful dietary pattern, with some room for improvement.   >60 Healthy dietary pattern, although there may be some specific behaviors that could be improved.   Nutrition Goals Re-Evaluation:  Nutrition Goals Re-Evaluation    Row Name 09/28/19 1415 10/26/19 1441           Goals   Nutrition Goal ST: substantial snack before and after exercise (bean salad) and bring applesauce to rehab, try to add greens to every dinner LT: Eat for heart and diabetes Better snacks and add more greens      Comment Discussed how to add more greens to meals as pt reports struggling with this. Ed is planning to continue to follow changes that he has already made.  He is working on snacking and adding in greens.      Expected Outcome ST: substantial snack before and after exercise (bean salad) and bring applesauce to rehab, try to add greens to every  dinner Continue to follow heart healthy changes already made.  Nutrition Goals Discharge (Final Nutrition Goals Re-Evaluation):  Nutrition Goals Re-Evaluation - 10/26/19 1441      Goals   Nutrition Goal Better snacks and add more greens    Comment Ed is planning to continue to follow changes that he has already made.  He is working on snacking and adding in greens.    Expected Outcome Continue to follow heart healthy changes already made.           Psychosocial: Target Goals: Acknowledge presence or absence of significant depression and/or stress, maximize coping skills, provide positive support system. Participant is able to verbalize types and ability to use techniques and skills needed for reducing stress and depression.   Education: Stress, Anxiety, and Depression - Group verbal and visual presentation to define topics covered.  Reviews how body is impacted by stress, anxiety, and depression.  Also discusses healthy ways to reduce stress and to treat/manage anxiety and depression.  Written material given at graduation.   Education: Sleep Hygiene -Provides group verbal and written instruction about how sleep can affect your health.  Define sleep hygiene, discuss sleep cycles and impact of sleep habits. Review good sleep hygiene tips.    Initial Review & Psychosocial Screening:  Initial Psych Review & Screening - 01/13/20 1406      Initial Review   Current issues with Current Stress Concerns    Source of Stress Concerns Chronic Illness      Family Dynamics   Good Support System? No   brother lives out of town, neighbors help when they can     Barriers   Psychosocial barriers to participate in program There are no identifiable barriers or psychosocial needs.;The patient should benefit from training in stress management and relaxation.      Screening Interventions   Interventions To provide support and resources with identified psychosocial needs;Provide feedback  about the scores to participant;Encouraged to exercise    Expected Outcomes Short Term goal: Utilizing psychosocial counselor, staff and physician to assist with identification of specific Stressors or current issues interfering with healing process. Setting desired goal for each stressor or current issue identified.;Long Term Goal: Stressors or current issues are controlled or eliminated.;Short Term goal: Identification and review with participant of any Quality of Life or Depression concerns found by scoring the questionnaire.;Long Term goal: The participant improves quality of Life and PHQ9 Scores as seen by post scores and/or verbalization of changes           Quality of Life Scores:  Quality of Life - 10/28/19 1424      Quality of Life Scores   Health/Function Pre 26.57 %    Health/Function Post 24.23 %    Health/Function % Change -8.81 %    Socioeconomic Pre 22.63 %    Socioeconomic Post 20 %    Socioeconomic % Change  -11.62 %    Psych/Spiritual Pre 29.14 %    Psych/Spiritual Post 24.2 %    Psych/Spiritual % Change -16.95 %    Family Pre 17.67 %    Family Post 13 %    Family % Change -26.43 %    GLOBAL Pre 25.72 %    GLOBAL Post 23.12 %    GLOBAL % Change -10.11 %          Scores of 19 and below usually indicate a poorer quality of life in these areas.  A difference of  2-3 points is a clinically meaningful difference.  A difference of 2-3 points in the total score  of the Quality of Life Index has been associated with significant improvement in overall quality of life, self-image, physical symptoms, and general health in studies assessing change in quality of life.  PHQ-9: Recent Review Flowsheet Data    Depression screen [facility] 2/9 01/12/2020 10/28/2019 09/09/2019 07/02/2019   Decreased Interest 3 2 1 2    Down, Depressed, Hopeless 0 0 0 1   PHQ - 2 Score 3 2 1 3    Altered sleeping 1  3 3 3    Tired, decreased energy 2 2 0 2   Change in appetite 1  1 0 1   Feeling bad or  failure about yourself  0 0 0 0   Trouble concentrating 0 1 0 0   Moving slowly or fidgety/restless 0 1 0 0   Suicidal thoughts 1  0 0 0   PHQ-9 Score 8 10 4 9    Difficult doing work/chores Not difficult at all Somewhat difficult Not difficult at all Not difficult at all     Interpretation of Total Score  Total Score Depression Severity:  1-4 = Minimal depression, 5-9 = Mild depression, 10-14 = Moderate depression, 15-19 = Moderately severe depression, 20-27 = Severe depression   Psychosocial Evaluation and Intervention:  Psychosocial Evaluation - 01/13/20 1407      Psychosocial Evaluation & Interventions   Interventions Stress management education;Relaxation education;Encouraged to exercise with the program and follow exercise prescription    Comments Ed is returning to rehab for pulmonary. He was doing well after graduation until his low sodium caused walking issues, hallucinations,etc. He was hospitalized and treated. He still has follow up labs coming up. Some stress caused by this and he has felt a decline in his stamina/mobility. He is wanting to come back to help gain back what he's lost    Expected Outcomes Short: attend pulmonary rehab for education and exercise. Long: develop and maintain positive self care habits.    Continue Psychosocial Services  Follow up required by staff           Psychosocial Re-Evaluation:   Psychosocial Discharge (Final Psychosocial Re-Evaluation):   Education: Education Goals: Education classes will be provided on a weekly basis, covering required topics. Participant will state understanding/return demonstration of topics presented.  Learning Barriers/Preferences:  Learning Barriers/Preferences - 01/13/20 1406      Learning Barriers/Preferences   Learning Barriers None    Learning Preferences None           General Pulmonary Education Topics:  Infection Prevention: - Provides verbal and written material to individual with discussion  of infection control including proper hand washing and proper equipment cleaning during exercise session. Flowsheet Row Cardiac Rehab from 01/13/2020 in [facility] Cardiac and Pulmonary Rehab  Date 01/12/20  Educator AS  Instruction Review Code 1- Verbalizes Understanding      Falls Prevention: - Provides verbal and written material to individual with discussion of falls prevention and safety. Flowsheet Row Cardiac Rehab from 01/13/2020 in [facility] Cardiac and Pulmonary Rehab  Date 01/12/20  Educator AS  Instruction Review Code 1- Verbalizes Understanding      Chronic Lung Disease Review: - Group verbal instruction with posters, models, PowerPoint presentations and videos,  to review new updates, new respiratory medications, new advancements in procedures and treatments. Providing information on websites and "800" numbers for continued self-education. Includes information about supplement oxygen, available portable oxygen systems, continuous and intermittent flow rates, oxygen safety, concentrators, and Medicare reimbursement for oxygen. Explanation of Pulmonary Drugs, including class, frequency, complications,  importance of spacers, rinsing mouth after steroid MDI's, and proper cleaning methods for nebulizers. Review of basic lung anatomy and physiology related to function, structure, and complications of lung disease. Review of risk factors. Discussion about methods for diagnosing sleep apnea and types of masks and machines for OSA. Includes a review of the use of types of environmental controls: [name] humidity, furnaces, filters, dust mite/pet prevention, HEPA vacuums. Discussion about weather changes, air quality and the benefits of nasal washing. Instruction on Warning signs, infection symptoms, calling MD promptly, preventive modes, and value of vaccinations. Review of effective airway clearance, coughing and/or vibration techniques. Emphasizing that all should Create an Action Plan. Written material  given at graduation.   AED/CPR: - Group verbal and written instruction with the use of models to demonstrate the basic use of the AED with the basic ABC's of resuscitation.    Anatomy and Cardiac Procedures: - Group verbal and visual presentation and models provide information about basic cardiac anatomy and function. Reviews the testing methods done to diagnose heart disease and the outcomes of the test results. Describes the treatment choices: Medical Management, Angioplasty, or Coronary Bypass Surgery for treating various heart conditions including Myocardial Infarction, Angina, Valve Disease, and Cardiac Arrhythmias.  Written material given at graduation.   Medication Safety: - Group verbal and visual instruction to review commonly prescribed medications for heart and lung disease. Reviews the medication, class of the drug, and side effects. Includes the steps to properly store meds and maintain the prescription regimen.  Written material given at graduation. Flowsheet Row Cardiac Rehab from 10/07/2019 in [facility] Cardiac and Pul[facility] Rehab  Date 10/07/19  Educator SB  Instruction Review Code 1- Verbalizes Understanding      Other: -Provides group and verbal instruction on various topics (see comments)   Knowledge Questionnaire Score:  Knowledge Questionnaire Score - 01/12/20 1503      Knowledge Questionnaire Score   Pre Score 13/18 oxygen            Core Components/Risk Factors/Patient Goals at Admission:  Personal Goals and Risk Factors at Admission - 01/13/20 1405      Core Components/Risk Factors/Patient Goals on Admission    Weight Management Yes;Weight Loss    Intervention Weight Management: Develop a combined nutrition and exercise program designed to reach desired caloric intake, while maintaining appropriate intake of nutrient and fiber, sodium and fats, and appropriate energy expenditure required for the weight goal.;Weight Management: Provide education and  appropriate resources to help participant work on and attain dietary goals.    Expected Outcomes Short Term: Continue to assess and modify interventions until short term weight is achieved;Long Term: Adherence to nutrition and physical activity/exercise program aimed toward attainment of established weight goal;Weight Loss: Understanding of general recommendations for a balanced deficit meal plan, which promotes 1-2 lb weight loss per week and includes a negative energy balance of [phone] kcal/d;Understanding recommendations for meals to include 15-35% energy as protein, 25-35% energy from fat, 35-60% energy from carbohydrates, less than  of dietary cholesterol, 20-35 gm of total fiber daily;Understanding of distribution of calorie intake throughout the day with the consumption of 4-5 meals/snacks    Diabetes Yes    Intervention Provide education about signs/symptoms and action to take for hypo/hyperglycemia.;Provide education about proper nutrition, including hydration, and aerobic/resistive exercise prescription along with prescribed medications to achieve blood glucose in normal ranges: Fasting glucose 65-99 mg/dL    Expected Outcomes Short Term: Participant verbalizes understanding of the signs/symptoms and immediate care of hyper/hypoglycemia,  proper foot care and importance of medication, aerobic/resistive exercise and nutrition plan for blood glucose control.;Long Term: Attainment of HbA1C < 7%.    Heart Failure Yes    Intervention Provide a combined exercise and nutrition program that is supplemented with education, support and counseling about heart failure. Directed toward relieving symptoms such as shortness of breath, decreased exercise tolerance, and extremity edema.    Expected Outcomes Improve functional capacity of life;Short term: Attendance in program 2-3 days a week with increased exercise capacity. Reported lower sodium intake. Reported increased fruit and vegetable intake. Reports  medication compliance.;Short term: Daily weights obtained and reported for increase. Utilizing diuretic protocols set by physician.;Long term: Adoption of self-care skills and reduction of barriers for early signs and symptoms recognition and intervention leading to self-care maintenance.    Hypertension Yes    Intervention Provide education on lifestyle modifcations including regular physical activity/exercise, weight management, moderate sodium restriction and increased consumption of fresh fruit, vegetables, and low fat dairy, alcohol moderation, and smoking cessation.;Monitor prescription use compliance.    Expected Outcomes Short Term: Continued assessment and intervention until BP is < 140/6290mm HG in hypertensive participants. < 130/3180mm HG in hypertensive participants with diabetes, heart failure or chronic kidney disease.;Long Term: Maintenance of blood pressure at goal levels.    Lipids Yes    Intervention Provide education and support for participant on nutrition & aerobic/resistive exercise along with prescribed medications to achieve LDL 70mg , HDL >40mg .    Expected Outcomes Short Term: Participant states understanding of desired cholesterol values and is compliant with medications prescribed. Participant is following exercise prescription and nutrition guidelines.;Long Term: Cholesterol controlled with medications as prescribed, with individualized exercise RX and with personalized nutrition plan. Value goals: LDL < 70mg , HDL > 40 mg.           Education:Diabetes - Individual verbal and written instruction to review signs/symptoms of diabetes, desired ranges of glucose level fasting, after meals and with exercise. Acknowledge that pre and post exercise glucose checks will be done for 3 sessions at entry of program. Flowsheet Row [facility]  Date 01/13/20  [facility]  Instruction Review Code 1- Verbalizes Understanding      Know  Your Numbers and Heart Failure: - Group verbal and visual instruction to discuss disease risk factors for cardiac and pulmonary disease and treatment options.  Reviews associated critical values for Overweight/Obesity, Hypertension, Cholesterol, and Diabetes.  Discusses basics of heart failure: signs/symptoms and treatments.  Introduces Heart Failure Zone chart for action plan for heart failure.  Written material given at graduation.   Core Components/Risk Factors/Patient Goals Review:   Goals and Risk Factor Review    Row Name 10/26/19 1442             Core Components/Risk Factors/Patient Goals Review   Personal Goals Review Weight Management/Obesity;Hypertension;Diabetes;Lipids       Review Ed will continue to work on his weight and watching his numbers.  He feels he has been control and a good plan going forward.  He plans to continue to work on weight loss with exercise and diet.       Expected Outcomes Continue to exercise and monitor risk factors.              Core Components/Risk Factors/Patient Goals at Discharge (Final Review):   Goals and Risk Factor Review - 10/26/19 1442      Core Components/Risk Factors/Patient Goals Review   Personal Goals Review  Weight Management/Obesity;Hypertension;Diabetes;Lipids    Review Ed will continue to work on his weight and watching his numbers.  He feels he has been control and a good plan going forward.  He plans to continue to work on weight loss with exercise and diet.    Expected Outcomes Continue to exercise and monitor risk factors.           ITP Comments:  ITP Comments    Row Name 09/23/19 1053 10/21/19 0631 11/11/19 0827 01/13/20 0705 01/18/20 1630   ITP Comments 30 Day review completed. Medical Director ITP review done, changes made as directed, and signed approval by Medical Director. 30 Day review completed. Medical Director ITP review done, changes made as directed, and signed approval by Medical Director. Discharge ITP sent  and signed by Dr. Hyacinth Meeker.  Discharge Summary routed to PCP and cardiologist. 30 Day review completed. Medical Director ITP review done, changes made as directed, and signed approval by Medical Director. First full day of exercise!  Patient was oriented to gym and equipment including functions, settings, policies, and procedures.  Patient's individual exercise prescription and treatment plan were reviewed.  All starting workloads were established based on the results of the 6 minute walk test done at initial orientation visit.  The plan for exercise progression was also introduced and progression will be customized based on patient's performance and goals.   Row Name 02/03/20 1718 02/10/20 1038 02/11/20 1512 02/23/20 1607 03/08/20 1416   ITP Comments Spoke with patient who reported he is experiencing sciatic pain. He was prescribed Prednisone which he feels is helping- he reports he will be out next week for [facility] and is not sure when he will be back. Patient will plan to call us next week to keep [place] updated. 30 Day review completed. Medical Director ITP review done, changes made as directed, and signed approval by Medical Director. Ed has been out with sciatic pain since last review. Pt called on 12/8.  Ed is still waiting to find out how his Dr plans to follow up.  He states his back is about the same.  He doesn't feel he can exercise at this time.  He will call back when he finds out what is causing the pain. Ed has not returned to class   Row Name 03/09/20 0625           ITP Comments 30 Day review completed. Medical Director ITP review done, changes made as directed, and signed approval by Medical Director.  Has not attended any sessions in Dec              Comments:

## 2020-03-09 NOTE — Telephone Encounter (Signed)
Ed is seeing a neurosurgeon soon and wants to wait until his back is better to come back to the program.  I explained we will discharge now and he can get a referral to return after treatment for back is done.

## 2020-03-10 DIAGNOSIS — I5022 Chronic systolic (congestive) heart failure: Secondary | ICD-10-CM

## 2020-03-10 NOTE — Progress Notes (Signed)
Discharge Progress Report  Patient Details  Name: Devin [name] [facility]. MRN: [id number] Date of Birth: 14-Dec-1945 Referring Provider:   Flowsheet Row Pulmonary Rehab from 01/12/2020 in [facility]  Referring Provider Welton Flakes       Number of Visits: 5 Reason for Discharge:  Early Exit:  Personal  Smoking History:  Social History   Tobacco Use  Smoking Status Former Smoker  Smokeless Tobacco Never Used  Tobacco Comment   Quit over 40 years ago    Diagnosis:  Heart failure, chronic systolic (HCC)  ADL [facility]:  Pulmonary Assessment Scores    Row Name 01/12/20 1503         ADL [facility]   SOB Score total 34     Rest 0     Walk 1     Stairs 4     Bath 1     Dress 1     Shop 1           CAT Score   CAT Score 2           mMRC Score   mMRC Score 1            Initial Exercise Prescription:  Initial Exercise Prescription - 01/12/20 1400      Date of Initial Exercise RX and Referring Provider   Date 01/12/20    Referring Provider Welton Flakes      Treadmill   MPH 1    Grade 0.5    Minutes 15    METs 1.83      Recumbant Bike   Level 1    Minutes 15    METs 1.5      NuStep   Level 1    SPM 80    Minutes 15    METs 1.5      Recumbant Elliptical   Level 1    RPM 50    Minutes 15    METs 1.5      REL-XR   Level 1    Watts 50    Minutes 15    METs 1.5      Biostep-RELP   Level 1    SPM 50    Minutes 15    METs 1.5      Prescription Details   Frequency (times per week) 3    Duration Progress to 30 minutes of continuous aerobic without signs/symptoms of physical distress      Intensity   THRR 40-80% of Max Heartrate 95-128    Ratings of Perceived Exertion 11-15    Perceived Dyspnea 0-4      Resistance Training   Training Prescription Yes    Weight 3 lb    Reps 10-15           Discharge Exercise Prescription (Final Exercise Prescription Changes):  Exercise Prescription Changes - 01/25/20 1200      Response to  Exercise   Blood Pressure (Admit) 106/60    Blood Pressure (Exercise) 112/56    Blood Pressure (Exit) 110/62    Heart Rate (Admit) 63 bpm    Heart Rate (Exercise) 70 bpm    Heart Rate (Exit) 67 bpm    Oxygen Saturation (Admit) 95 %    Oxygen Saturation (Exercise) 94 %    Oxygen Saturation (Exit) 97 %    Rating of Perceived Exertion (Exercise) 12    Perceived Dyspnea (Exercise) 0    Symptoms none    Comments second full day of exericse  Duration Continue with 30 min of aerobic exercise without signs/symptoms of physical distress.    Intensity THRR unchanged      Progression   Progression Continue to progress workloads to maintain intensity without signs/symptoms of physical distress.    Average METs 2.1      Resistance Training   Training Prescription Yes    Weight 5 lb    Reps 10-15      Interval Training   Interval Training No      Treadmill   MPH 1.7    Grade 0    Minutes 15    METs 2.3      REL-XR   Level 3    Minutes 15      T5 Nustep   Level 1    Minutes 15    METs 1.9           Functional Capacity:  6 Minute Walk    Row Name 10/26/19 1419 01/12/20 1455       6 Minute Walk   Phase Discharge Initial    Distance -- 680 feet    Distance % Change 1.38 % --    Distance Feet Change 15 ft --    Walk Time 6 minutes 6 minutes    # of Rest Breaks 0 0    MPH 2.08 1.28    METS 2.25 1.3    RPE 11 13    Perceived Dyspnea  -- 1    VO2 Peak 7.87 4.57    Symptoms No No    Resting HR 80 bpm 61 bpm    Resting BP 130/64 136/60    Resting Oxygen Saturation  -- 95 %    Exercise Oxygen Saturation  during 6 min walk -- 94 %    Max Ex. HR 92 bpm 74 bpm    Max Ex. BP 142/72 124/66    2 Minute Post BP -- 126/66         Interval HR   1 Minute HR -- 65    2 Minute HR -- 74    3 Minute HR -- 69    4 Minute HR -- 67    5 Minute HR -- 66    6 Minute HR -- 68    2 Minute Post HR -- 64    Interval Heart Rate? -- Yes         Interval Oxygen   Interval  Oxygen? -- Yes    Baseline Oxygen Saturation % -- 95 %    1 Minute Oxygen Saturation % -- 94 %    1 Minute Liters of Oxygen -- 0 L    2 Minute Oxygen Saturation % -- 95 %    2 Minute Liters of Oxygen -- 0 L    3 Minute Oxygen Saturation % -- 94 %    3 Minute Liters of Oxygen -- 0 L    4 Minute Oxygen Saturation % -- 95 %    4 Minute Liters of Oxygen -- 0 L    5 Minute Oxygen Saturation % -- 96 %    5 Minute Liters of Oxygen -- 0 L    6 Minute Oxygen Saturation % -- 96 %    6 Minute Liters of Oxygen -- 0 L    2 Minute Post Oxygen Saturation % -- 95 %    2 Minute Post Liters of Oxygen -- 0 L           Psychological, QOL, Others -  Outcomes: PHQ 2/9: Depression screen [facility] 2/9 01/12/2020 10/28/2019 09/09/2019 07/02/2019  Decreased Interest 3 2 1 2   Down, Depressed, Hopeless 0 0 0 1  PHQ - 2 Score 3 2 1 3   Altered sleeping 1 3 3 3   Tired, decreased energy 2 2 0 2  Change in appetite 1 1 0 1  Feeling bad or failure about yourself  0 0 0 0  Trouble concentrating 0 1 0 0  Moving slowly or fidgety/restless 0 1 0 0  Suicidal thoughts 1 0 0 0  PHQ-9 Score 8 10 4 9   Difficult doing work/chores Not difficult at all Somewhat difficult Not difficult at all Not difficult at all    Quality of Life:  Quality of Life - 10/28/19 1424      Quality of Life Scores   Health/Function Pre 26.57 %    Health/Function Post 24.23 %    Health/Function % Change -8.81 %    Socioeconomic Pre 22.63 %    Socioeconomic Post 20 %    Socioeconomic % Change  -11.62 %    Psych/Spiritual Pre 29.14 %    Psych/Spiritual Post 24.2 %    Psych/Spiritual % Change -16.95 %    Family Pre 17.67 %    Family Post 13 %    Family % Change -26.43 %    GLOBAL Pre 25.72 %    GLOBAL Post 23.12 %    GLOBAL % Change -10.11 %           Personal Goals: Goals established at orientation with interventions provided to work toward goal.  Personal Goals and Risk Factors at Admission - 01/13/20 1405      Core  Components/Risk Factors/Patient Goals on Admission    Weight Management Yes;Weight Loss    Intervention Weight Management: Develop a combined nutrition and exercise program designed to reach desired caloric intake, while maintaining appropriate intake of nutrient and fiber, sodium and fats, and appropriate energy expenditure required for the weight goal.;Weight Management: Provide education and appropriate resources to help participant work on and attain dietary goals.    Expected Outcomes Short Term: Continue to assess and modify interventions until short term weight is achieved;Long Term: Adherence to nutrition and physical activity/exercise program aimed toward attainment of established weight goal;Weight Loss: Understanding of general recommendations for a balanced deficit meal plan, which promotes 1-2 lb weight loss per week and includes a negative energy balance of [phone] kcal/d;Understanding recommendations for meals to include 15-35% energy as protein, 25-35% energy from fat, 35-60% energy from carbohydrates, less than 200mg  of dietary cholesterol, 20-35 gm of total fiber daily;Understanding of distribution of calorie intake throughout the day with the consumption of 4-5 meals/snacks    Diabetes Yes    Intervention Provide education about signs/symptoms and action to take for hypo/hyperglycemia.;Provide education about proper nutrition, including hydration, and aerobic/resistive exercise prescription along with prescribed medications to achieve blood glucose in normal ranges: Fasting glucose 65-99 mg/dL    Expected Outcomes Short Term: Participant verbalizes understanding of the signs/symptoms and immediate care of hyper/hypoglycemia, proper foot care and importance of medication, aerobic/resistive exercise and nutrition plan for blood glucose control.;Long Term: Attainment of HbA1C < 7%.    Heart Failure Yes    Intervention Provide a combined exercise and nutrition program that is supplemented  with education, support and counseling about heart failure. Directed toward relieving symptoms such as shortness of breath, decreased exercise tolerance, and extremity edema.    Expected Outcomes Improve functional capacity of life;Short  term: Attendance in program 2-3 days a week with increased exercise capacity. Reported lower sodium intake. Reported increased fruit and vegetable intake. Reports medication compliance.;Short term: Daily weights obtained and reported for increase. Utilizing diuretic protocols set by physician.;Long term: Adoption of self-care skills and reduction of barriers for early signs and symptoms recognition and intervention leading to self-care maintenance.    Hypertension Yes    Intervention Provide education on lifestyle modifcations including regular physical activity/exercise, weight management, moderate sodium restriction and increased consumption of fresh fruit, vegetables, and low fat dairy, alcohol moderation, and smoking cessation.;Monitor prescription use compliance.    Expected Outcomes Short Term: Continued assessment and intervention until BP is < 140/41mm HG in hypertensive participants. < 130/42mm HG in hypertensive participants with diabetes, heart failure or chronic kidney disease.;Long Term: Maintenance of blood pressure at goal levels.    Lipids Yes    Intervention Provide education and support for participant on nutrition & aerobic/resistive exercise along with prescribed medications to achieve LDL 70mg , HDL >40mg .    Expected Outcomes Short Term: Participant states understanding of desired cholesterol values and is compliant with medications prescribed. Participant is following exercise prescription and nutrition guidelines.;Long Term: Cholesterol controlled with medications as prescribed, with individualized exercise RX and with personalized nutrition plan. Value goals: LDL < , HDL > 40 mg.            Personal Goals Discharge:  Goals and Risk Factor  Review    Row Name 10/26/19 1442             Core Components/Risk Factors/Patient Goals Review   Personal Goals Review Weight Management/Obesity;Hypertension;Diabetes;Lipids       Review Ed will continue to work on his weight and watching his numbers.  He feels he has been control and a good plan going forward.  He plans to continue to work on weight loss with exercise and diet.       Expected Outcomes Continue to exercise and monitor risk factors.              Exercise Goals and Review:  Exercise Goals    Row Name 01/12/20 1501             Exercise Goals   Increase Physical Activity Yes       Intervention Provide advice, education, support and counseling about physical activity/exercise needs.;Develop an individualized exercise prescription for aerobic and resistive training based on initial evaluation findings, risk stratification, comorbidities and participant's personal goals.       Expected Outcomes Long Term: Add in home exercise to make exercise part of routine and to increase amount of physical activity.;Long Term: Exercising regularly at least 3-5 days a week.;Short Term: Attend rehab on a regular basis to increase amount of physical activity.       Intervention Provide advice, education, support and counseling about physical activity/exercise needs.;Develop an individualized exercise prescription for aerobic and resistive training based on initial evaluation findings, risk stratification, comorbidities and participant's personal goals.       Expected Outcomes Short Term: Increase workloads from initial exercise prescription for resistance, speed, and METs.;Short Term: Perform resistance training exercises routinely during rehab and add in resistance training at home;Long Term: Improve cardiorespiratory fitness, muscular endurance and strength as measured by increased METs and functional capacity ( )       Able to understand and use rate of perceived exertion (RPE) scale Yes        Intervention Provide education and explanation on how to use RPE  scale       Expected Outcomes Short Term: Able to use RPE daily in rehab to express subjective intensity level;Long Term:  Able to use RPE to guide intensity level when exercising independently       Able to understand and use Dyspnea scale Yes       Intervention Provide education and explanation on how to use Dyspnea scale       Expected Outcomes Short Term: Able to use Dyspnea scale daily in rehab to express subjective sense of shortness of breath during exertion;Long Term: Able to use Dyspnea scale to guide intensity level when exercising independently       Knowledge and understanding of Target Heart Rate Range (THRR) Yes       Intervention Provide education and explanation of THRR including how the numbers were predicted and where they are located for reference       Expected Outcomes Short Term: Able to state/look up THRR;Short Term: Able to use daily as guideline for intensity in rehab;Long Term: Able to use THRR to govern intensity when exercising independently       Able to check pulse independently Yes       Intervention Provide education and demonstration on how to check pulse in carotid and radial arteries.;Review the importance of being able to check your own pulse for safety during independent exercise       Expected Outcomes Short Term: Able to explain why pulse checking is important during independent exercise;Long Term: Able to check pulse independently and accurately       Intervention Provide education, explanation, and written materials on patient's individual exercise prescription       Expected Outcomes Short Term: Able to explain program exercise prescription;Long Term: Able to explain home exercise prescription to exercise independently              Exercise Goals Re-Evaluation:  Exercise Goals Re-Evaluation    Row Name 09/22/19 1320 10/06/19 1353 10/20/19 1353 10/26/19 1435 11/03/19 1354      Exercise Goal Re-Evaluation   Exercise Goals Review Increase Physical Activity;Increase Strength and Stamina;Understanding of Exercise Prescription Increase Physical Activity;Increase Strength and Stamina;Understanding of Exercise Prescription Increase Physical Activity;Increase Strength and Stamina;Understanding of Exercise Prescription Increase Physical Activity;Increase Strength and Stamina;Understanding of Exercise Prescription Increase Physical Activity;Increase Strength and Stamina;Understanding of Exercise Prescription   Comments Ed is making steady progress.  he has increase to 2.6 mph on TM. Ed has been doing well in rehab.  He is now up to level 5 on the T5 NuStep and XR.  We will continue to monitor his progress. Ed continues to do well with exercise.  He is now on level 6 for XR and NS. Ed is set to graduate next week.  He improved his post some. He is planning to continue to exericse at [employer] in [place] using his Silver Sneakers. Ed was out on vacation this week.  He will graduate next week and go to [employer].   Expected Outcomes Short: attend consistently Long:  increase stamina Short: Continue to increase workloads Long; Continue to improve stamina. Short: Continue to increase workloads Long; Continue to improve stamina. Continue to exercise independently. Continue to exercise independently.   Row Name 01/18/20 1632 01/25/20 1253 02/23/20 1607         Exercise Goal Re-Evaluation   Exercise Goals Review Increase Physical Activity;Able to understand and use rate of perceived exertion (RPE) scale;Knowledge and understanding of Target Heart Rate Range (THRR);Understanding of  Exercise Prescription;Increase Strength and Stamina;Able to check pulse independently;Able to understand and use Dyspnea scale Increase Physical Activity;Increase Strength and Stamina;Understanding of Exercise Prescription --     Comments Reviewed RPE and dyspnea scales, THR and program prescription  with pt today.  Pt voiced understanding and was given a copy of goals to take home. Ed has completed his first two full days of exercise.  He is already at 1.9 METs on teh T5 NuStep.  We will continue to monitor his progress. Out since last review     Expected Outcomes Short: Use RPE daily to regulate intensity. Long: Follow program prescription in THR. Short: Continue to attend regularly  Long: Continue to follow program prescription --            Nutrition & Weight - Outcomes:  Pre Biometrics - 01/12/20 1502      Pre Biometrics   Height  (1.803 m)    Weight 199 lb 11.2 oz (90.6 kg)    BMI (Calculated) 27.86    Single Leg Stand 0 seconds           Post Biometrics - 10/26/19 1420       Post  Biometrics   Height 5' 11.1" (1.806 m)    Weight 208 lb (94.3 kg)    BMI (Calculated) 28.93    Single Leg Stand 3.26 seconds           Nutrition:  Nutrition Therapy & Goals - 01/25/20 1605      Nutrition Therapy   Diet Low Na, Heart Healthy, Diabetes Friendly, pulmonary MNT    Drug/Food Interactions Statins/Certain Fruits    Protein (specify units) 95g    Fiber 30 grams    Whole Grain Foods 3 servings    Saturated Fats 12 max. grams    Fruits and Vegetables 8 servings/day    Sodium 1.5 grams      Personal Nutrition Goals   Nutrition Goal ST: Protein (greek yogurt, cottage cheese, meat, fish, chicken, beans, eggs) with all meals, add two snacks before and after exercise (with protein and carbohydrates). LT: meet protein needs, gain muscle back from recent weight loss    Comments B: cheerios with banana and blueberries and milk (skim or 1% lactaid) L: bowl of soup and salad or antipasta salad with ham and cheese D: may go out and get pasta. - hasn't eaten red meat in a while. Pasta or salad - white pasta, he also like putanesca pasta with lots of vegetables. Salads: romaine with ranch dressing and sometimes a greek salad. Drink: coffee and water or seltzer. He reports no longer  on potassium restriction. He reports still losing weight. He will have some peanut butter and crackers Discussed heart healthy eating as well as high protein MNT to meet needs.      Intervention Plan   Intervention Prescribe, educate and counsel regarding individualized specific dietary modifications aiming towards targeted core components such as weight, hypertension, lipid management, diabetes, heart failure and other comorbidities.;Nutrition handout(s) given to patient.    Expected Outcomes Short Term Goal: Understand basic principles of dietary content, such as calories, fat, sodium, cholesterol and nutrients.;Short Term Goal: A plan has been developed with personal nutrition goals set during dietitian appointment.;Long Term Goal: Adherence to prescribed nutrition plan.           Nutrition Discharge:  Nutrition Assessments - 01/12/20 1505      MEDFICTS Scores   Pre Score 13  Education Questionnaire Score:  Knowledge Questionnaire Score - 01/12/20 1503      Knowledge Questionnaire Score   Pre Score 13/18 oxygen           Goals reviewed with patient; copy given to patient.

## 2020-03-10 NOTE — Progress Notes (Signed)
Pulmonary Individual Treatment Plan  Patient Details  Name: Devin [name]. MRN: [id number] Date of Birth: 19-Oct-1945 Referring Provider:   Flowsheet Row Pulmonary Rehab from 01/12/2020 in [facility] Cardiac and Pulmonary Rehab  Referring Provider Welton Flakes      Initial Encounter Date:  Flowsheet Row Pulmonary Rehab from 01/12/2020 in [facility] Cardiac and Pulmonary Rehab  Date 01/12/20      Visit Diagnosis: Heart failure, chronic systolic (HCC)  Patient's [name] Medications on Admission:  Current Outpatient Medications:  .  amiodarone (PACERONE) 400 MG tablet, Take 1 tablet (400 mg total) by mouth daily. (Patient not taking: Reported on 01/11/2020), Disp: 30 tablet, Rfl: 0 .  apixaban (ELIQUIS) 5 MG TABS tablet, Take 5 mg by mouth 2 (two) times daily. , Disp: , Rfl:  .  atorvastatin (LIPITOR) 80 MG tablet, Take 1 tablet (80 mg total) by mouth daily., Disp: 30 tablet, Rfl: 11 .  buPROPion (WELLBUTRIN XL) 300 MG 24 hr tablet, Take 300 mg by mouth daily. , Disp: , Rfl:  .  carvedilol (COREG) 25 MG tablet, Take 1 tablet (25 mg total) by mouth 2 (two) times daily., Disp: 60 tablet, Rfl: 11 .  ENTRESTO 49-51 MG, Take 1 tablet by mouth 2 (two) times daily., Disp: , Rfl:  .  EPINEPHrine 0.3 mg/0.3 mL IJ SOAJ injection, Inject 0.3 mg into the muscle as needed for anaphylaxis., Disp: , Rfl:  .  ezetimibe (ZETIA) 10 MG tablet, Take 10 mg by mouth daily., Disp: , Rfl:  .  furosemide (LASIX) 20 MG tablet, Take 1 tablet (20 mg total) by mouth daily. (Patient not taking: Reported on 01/11/2020), Disp: 30 tablet, Rfl: 11 .  gabapentin (NEURONTIN) 300 MG capsule, Take 300 mg by mouth 2 (two) times daily., Disp: , Rfl:  .  metFORMIN (GLUCOPHAGE) 500 MG tablet, Take 500 mg by mouth 2 (two) times daily., Disp: , Rfl:  .  nitroGLYCERIN (NITROSTAT) 0.4 MG SL tablet, Place 0.4 mg under the tongue every 5 (five) minutes as needed for chest pain., Disp: , Rfl:  .  sertraline (ZOLOFT) 25 MG tablet, Take 25 mg by mouth  daily., Disp: , Rfl:  .  sildenafil (VIAGRA) 50 MG tablet, Take 50 mg by mouth daily as needed for erectile dysfunction., Disp: , Rfl:  No current facility-administered medications for this visit.  Facility-Administered Medications Ordered in Other Visits:  .  sodium chloride flush (NS) 0.9 % injection 3 mL, 3 mL, Intravenous, Q12H, Laurier Nancy, MD  Past Medical History: Past Medical History:  Diagnosis Date  . Arrhythmia    atrial fibrillation  . CAD (coronary artery disease)   . CHF (congestive heart failure) (HCC)   . Diabetes mellitus without complication (HCC)   . Hypertension   . MI, old   . Sleep apnea     Tobacco Use: Social History   Tobacco Use  Smoking Status Former Smoker  Smokeless Tobacco Never Used  Tobacco Comment   Quit over 40 years ago    Labs: Recent Review [employer] for [facility] Cardiac and Pulmonary Rehab Latest Ref Rng & Units 05/16/2019 12/06/2019   Hemoglobin A1c 4.8 - 5.6 % 5.8(H) 6.0(H)       Pulmonary Assessment Scores:  Pulmonary Assessment Scores    Row Name 01/12/20 1503         ADL UCSD   SOB Score total 34     Rest 0     Walk 1  Stairs 4     Bath 1     Dress 1     Shop 1           CAT Score   CAT Score 2           mMRC Score   mMRC Score 1            UCSD: Self-administered rating of dyspnea associated with activities of daily living (ADLs) 6-point scale (0 = "not at all" to 5 = "maximal or unable to do because of breathlessness")  Scoring Scores range from 0 to 120.  Minimally important difference is 5 units  CAT: CAT can identify the health impairment of COPD patients and is better correlated with disease progression.  CAT has a scoring range of zero to 40. The CAT score is classified into four groups of low (less than 10), medium (10 - 20), high (21-30) and very high (31-40) based on the impact level of disease on health status. A CAT score over 10 suggests significant symptoms.  A worsening CAT  score could be explained by an exacerbation, poor medication adherence, poor inhaler technique, or progression of COPD or comorbid conditions.  CAT MCID is 2 points  mMRC: mMRC (Modified Medical Research Council) Dyspnea Scale is used to assess the degree of baseline functional disability in patients of respiratory disease due to dyspnea. No minimal important difference is established. A decrease in score of 1 point or greater is considered a positive change.   Pulmonary Function Assessment:   Exercise Target Goals: Exercise Program Goal: Individual exercise prescription set using results from initial 6 min walk test and THRR while considering  patient's activity barriers and safety.   Exercise Prescription Goal: Initial exercise prescription builds to 30-45 minutes a day of aerobic activity, 2-3 days per week.  [name] exercise guidelines will be given to patient during program as part of exercise prescription that the participant will acknowledge.  Education: Aerobic Exercise: - Group verbal and visual presentation on the components of exercise prescription. Introduces F.I.T.T principle from ACSM for exercise prescriptions.  Reviews F.I.T.T. principles of aerobic exercise including progression. Written material given at graduation.   Education: Resistance Exercise: - Group verbal and visual presentation on the components of exercise prescription. Introduces F.I.T.T principle from ACSM for exercise prescriptions  Reviews F.I.T.T. principles of resistance exercise including progression. Written material given at graduation.    Education: Exercise & Equipment Safety: - Individual verbal instruction and demonstration of equipment use and safety with use of the equipment. Flowsheet Row Cardiac Rehab from 01/13/2020 in [facility] Cardiac and Pulmonary Rehab  Date 01/12/20  Educator AS  Instruction Review Code 1- Verbalizes Understanding      Education: Exercise Physiology & General Exercise  Guidelines: - Group verbal and written instruction with models to review the exercise physiology of the cardiovascular system and associated critical values. Provides general exercise guidelines with specific guidelines to those with heart or lung disease.  Flowsheet Row Cardiac Rehab from 10/07/2019 in [facility] Cardiac and Pulmonary Rehab  Date 09/23/19  Educator AS  Instruction Review Code 1- Verbalizes Understanding      Education: Flexibility, Balance, Mind/Body Relaxation: - Group verbal and visual presentation with interactive activity on the components of exercise prescription. Introduces F.I.T.T principle from ACSM for exercise prescriptions. Reviews F.I.T.T. principles of flexibility and balance exercise training including progression. Also discusses the mind body connection.  Reviews various relaxation techniques to help reduce and manage stress (i.e. Deep breathing, progressive muscle  relaxation, and visualization). Balance handout provided to take [name]. Written material given at graduation.   Activity Barriers & Risk Stratification:   6 Minute Walk:  6 Minute Walk    Row Name 10/26/19 1419 01/12/20 1455       6 Minute Walk   Phase Discharge Initial    Distance -- 680 feet    Distance % Change 1.38 % --    Distance Feet Change 15 ft --    Walk Time 6 minutes 6 minutes    # of Rest Breaks 0 0    MPH 2.08 1.28    METS 2.25 1.3    RPE 11 13    Perceived Dyspnea  -- 1    VO2 Peak 7.87 4.57    Symptoms No No    Resting HR 80 bpm 61 bpm    Resting BP 130/64 136/60    Resting Oxygen Saturation  -- 95 %    Exercise Oxygen Saturation  during 6 min walk -- 94 %    Max Ex. HR 92 bpm 74 bpm    Max Ex. BP 142/72 124/66    2 Minute Post BP -- 126/66         Interval HR   1 Minute HR -- 65    2 Minute HR -- 74    3 Minute HR -- 69    4 Minute HR -- 67    5 Minute HR -- 66    6 Minute HR -- 68    2 Minute Post HR -- 64    Interval Heart Rate? -- Yes         Interval Oxygen    Interval Oxygen? -- Yes    Baseline Oxygen Saturation % -- 95 %    1 Minute Oxygen Saturation % -- 94 %    1 Minute Liters of Oxygen -- 0 L    2 Minute Oxygen Saturation % -- 95 %    2 Minute Liters of Oxygen -- 0 L    3 Minute Oxygen Saturation % -- 94 %    3 Minute Liters of Oxygen -- 0 L    4 Minute Oxygen Saturation % -- 95 %    4 Minute Liters of Oxygen -- 0 L    5 Minute Oxygen Saturation % -- 96 %    5 Minute Liters of Oxygen -- 0 L    6 Minute Oxygen Saturation % -- 96 %    6 Minute Liters of Oxygen -- 0 L    2 Minute Post Oxygen Saturation % -- 95 %    2 Minute Post Liters of Oxygen -- 0 L          Oxygen Initial Assessment:   Oxygen Re-Evaluation:  Oxygen Re-Evaluation    Row Name 01/18/20 1632             Program Oxygen Prescription   Program Oxygen Prescription None               [name] Oxygen   [name] Oxygen Device None       [name] Exercise Oxygen Prescription None       [name] Resting Oxygen Prescription None               Goals/Expected Outcomes   Short Term Goals To learn and understand importance of monitoring SPO2 with pulse oximeter and demonstrate accurate use of the pulse oximeter.;To learn and understand importance of maintaining oxygen saturations>88%;To learn and  demonstrate proper pursed lip breathing techniques or other breathing techniques.;To learn and demonstrate proper use of respiratory medications;Other       Long  Term Goals Verbalizes importance of monitoring SPO2 with pulse oximeter and return demonstration;Maintenance of O2 saturations>88%;Exhibits proper breathing techniques, such as pursed lip breathing or other method taught during program session;Compliance with respiratory medication       Comments Reviewed PLB technique with pt.  Talked about how it works and it's importance in maintaining their exercise saturations.       Goals/Expected Outcomes Short: Become more profiecient at using PLB.   Long: Become independent at using PLB.               Oxygen Discharge (Final Oxygen Re-Evaluation):  Oxygen Re-Evaluation - 01/18/20 1632      Program Oxygen Prescription   Program Oxygen Prescription None      [name] Oxygen   [name] Oxygen Device None    [name] Exercise Oxygen Prescription None    [name] Resting Oxygen Prescription None      Goals/Expected Outcomes   Short Term Goals To learn and understand importance of monitoring SPO2 with pulse oximeter and demonstrate accurate use of the pulse oximeter.;To learn and understand importance of maintaining oxygen saturations>88%;To learn and demonstrate proper pursed lip breathing techniques or other breathing techniques.;To learn and demonstrate proper use of respiratory medications;Other    Long  Term Goals Verbalizes importance of monitoring SPO2 with pulse oximeter and return demonstration;Maintenance of O2 saturations>88%;Exhibits proper breathing techniques, such as pursed lip breathing or other method taught during program session;Compliance with respiratory medication    Comments Reviewed PLB technique with pt.  Talked about how it works and it's importance in maintaining their exercise saturations.    Goals/Expected Outcomes Short: Become more profiecient at using PLB.   Long: Become independent at using PLB.           Initial Exercise Prescription:  Initial Exercise Prescription - 01/12/20 1400      Date of Initial Exercise RX and Referring Provider   Date 01/12/20    Referring Provider Welton Flakes      Treadmill   MPH 1    Grade 0.5    Minutes 15    METs 1.83      Recumbant Bike   Level 1    Minutes 15    METs 1.5      NuStep   Level 1    SPM 80    Minutes 15    METs 1.5      Recumbant Elliptical   Level 1    RPM 50    Minutes 15    METs 1.5      REL-XR   Level 1    Watts 50    Minutes 15    METs 1.5      Biostep-RELP   Level 1    SPM 50    Minutes 15    METs 1.5      Prescription Details   Frequency (times per week) 3    Duration Progress  to 30 minutes of continuous aerobic without signs/symptoms of physical distress      Intensity   THRR 40-80% of Max Heartrate 95-128    Ratings of Perceived Exertion 11-15    Perceived Dyspnea 0-4      Resistance Training   Training Prescription Yes    Weight 3 lb    Reps 10-15  Perform Capillary Blood Glucose checks as needed.  Exercise Prescription Changes:  Exercise Prescription Changes    Row Name 09/22/19 1300 10/06/19 1300 10/20/19 1300 11/03/19 1300 01/12/20 1500     Response to Exercise   Blood Pressure (Admit) 110/72 110/60 146/74 118/62 136/60   Blood Pressure (Exercise) 152/70 180/88 162/76 142/76 124/66   Blood Pressure (Exit) 104/66 118/70 128/74 100/60 126/66   Heart Rate (Admit) 71 bpm 70 bpm 75 bpm 70 bpm 61 bpm   Heart Rate (Exercise) 88 bpm 90 bpm 90 bpm 109 bpm 74 bpm   Heart Rate (Exit) 64 bpm 69 bpm 73 bpm 103 bpm 64 bpm   Rating of Perceived Exertion (Exercise) 15 17 15 13 13    Perceived Dyspnea (Exercise) -- -- -- -- 1   Symptoms -- none none none --   Duration Continue with 30 min of aerobic exercise without signs/symptoms of physical distress. Continue with 30 min of aerobic exercise without signs/symptoms of physical distress. Continue with 30 min of aerobic exercise without signs/symptoms of physical distress. Continue with 30 min of aerobic exercise without signs/symptoms of physical distress. --   Intensity THRR unchanged THRR unchanged THRR unchanged THRR unchanged --     Progression   Progression Continue to progress workloads to maintain intensity without signs/symptoms of physical distress. Continue to progress workloads to maintain intensity without signs/symptoms of physical distress. Continue to progress workloads to maintain intensity without signs/symptoms of physical distress. Continue to progress workloads to maintain intensity without signs/symptoms of physical distress. --   Average METs 2.75 3.29 3 3.53 --     Resistance  Training   Training Prescription Yes Yes Yes Yes --   Weight 5 lb 5 lb 5 lb 5 lb --   Reps 10-15 10-15 10-15 10-15 --     Interval Training   Interval Training No No No No --     Treadmill   MPH 2.6 2.6 2.3 2.3 --   Grade 0.5 0.5 0.5 0 --   Minutes 15 15 15 15  --   METs 3.17 3.17 2.92 2.76 --     NuStep   Level 5 -- -- -- --   SPM 80 -- -- -- --   Minutes 15 -- -- -- --   METs 2.3 -- -- -- --     REL-XR   Level -- 5 6 6  --   Watts -- -- 50 -- --   Minutes -- 15 15 15  --   METs -- 3.3 3 4.3 --     T5 Nustep   Level -- 5 -- -- --   Minutes -- 15 -- -- --   METs -- 3.4 -- -- --     [name] Exercise Plan   Plans to continue exercise at [name] (comment)  walking [name] (comment)  walking [name] (comment)  walking [name] (comment)  walking --   Frequency Add 2 additional days to program exercise sessions. Add 2 additional days to program exercise sessions. Add 2 additional days to program exercise sessions. Add 2 additional days to program exercise sessions. --   Initial [name] Exercises Provided 08/03/19 08/03/19 08/03/19 08/03/19 --   Row Name 01/25/20 1200             Response to Exercise   Blood Pressure (Admit) 106/60       Blood Pressure (Exercise) 112/56       Blood Pressure (Exit) 110/62       Heart Rate (Admit) 63 bpm  Heart Rate (Exercise) 70 bpm       Heart Rate (Exit) 67 bpm       Oxygen Saturation (Admit) 95 %       Oxygen Saturation (Exercise) 94 %       Oxygen Saturation (Exit) 97 %       Rating of Perceived Exertion (Exercise) 12       Perceived Dyspnea (Exercise) 0       Symptoms none       Comments second full day of exericse       Duration Continue with 30 min of aerobic exercise without signs/symptoms of physical distress.       Intensity THRR unchanged               Progression   Progression Continue to progress workloads to maintain intensity without signs/symptoms of physical distress.       Average METs 2.1               Resistance Training    Training Prescription Yes       Weight 5 lb       Reps 10-15               Interval Training   Interval Training No               Treadmill   MPH 1.7       Grade 0       Minutes 15       METs 2.3               REL-XR   Level 3       Minutes 15               T5 Nustep   Level 1       Minutes 15       METs 1.9              Exercise Comments:   Exercise Goals and Review:  Exercise Goals    Row Name 01/12/20 1501             Exercise Goals   Increase Physical Activity Yes       Intervention Provide advice, education, support and counseling about physical activity/exercise needs.;Develop an individualized exercise prescription for aerobic and resistive training based on initial evaluation findings, risk stratification, comorbidities and participant's personal goals.       Expected Outcomes Long Term: Add in [name] exercise to make exercise part of routine and to increase amount of physical activity.;Long Term: Exercising regularly at least 3-5 days a week.;Short Term: Attend rehab on a regular basis to increase amount of physical activity.       Intervention Provide advice, education, support and counseling about physical activity/exercise needs.;Develop an individualized exercise prescription for aerobic and resistive training based on initial evaluation findings, risk stratification, comorbidities and participant's personal goals.       Expected Outcomes Short Term: Increase workloads from initial exercise prescription for resistance, speed, and METs.;Short Term: Perform resistance training exercises routinely during rehab and add in resistance training at [name];Long Term: Improve cardiorespiratory fitness, muscular endurance and strength as measured by increased METs and functional capacity ( )       Able to understand and use rate of perceived exertion (RPE) scale Yes       Intervention Provide education and explanation on how to use RPE scale       Expected Outcomes  Short Term: Able  to use RPE daily in rehab to express subjective intensity level;Long Term:  Able to use RPE to guide intensity level when exercising independently       Able to understand and use Dyspnea scale Yes       Intervention Provide education and explanation on how to use Dyspnea scale       Expected Outcomes Short Term: Able to use Dyspnea scale daily in rehab to express subjective sense of shortness of breath during exertion;Long Term: Able to use Dyspnea scale to guide intensity level when exercising independently       Knowledge and understanding of Target Heart Rate Range (THRR) Yes       Intervention Provide education and explanation of THRR including how the numbers were predicted and where they are located for reference       Expected Outcomes Short Term: Able to state/look up THRR;Short Term: Able to use daily as guideline for intensity in rehab;Long Term: Able to use THRR to govern intensity when exercising independently       Able to check pulse independently Yes       Intervention Provide education and demonstration on how to check pulse in carotid and radial arteries.;Review the importance of being able to check your own pulse for safety during independent exercise       Expected Outcomes Short Term: Able to explain why pulse checking is important during independent exercise;Long Term: Able to check pulse independently and accurately       Intervention Provide education, explanation, and written materials on patient's individual exercise prescription       Expected Outcomes Short Term: Able to explain program exercise prescription;Long Term: Able to explain [name] exercise prescription to exercise independently              Exercise Goals Re-Evaluation :  Exercise Goals Re-Evaluation    Row Name 09/22/19 1320 10/06/19 1353 10/20/19 1353 10/26/19 1435 11/03/19 1354     Exercise Goal Re-Evaluation   Exercise Goals Review Increase Physical Activity;Increase Strength and  Stamina;Understanding of Exercise Prescription Increase Physical Activity;Increase Strength and Stamina;Understanding of Exercise Prescription Increase Physical Activity;Increase Strength and Stamina;Understanding of Exercise Prescription Increase Physical Activity;Increase Strength and Stamina;Understanding of Exercise Prescription Increase Physical Activity;Increase Strength and Stamina;Understanding of Exercise Prescription   Comments Ed is making steady progress.  he has increase to 2.6 mph on TM. Ed has been doing well in rehab.  He is now up to level 5 on the T5 NuStep and XR.  We will continue to monitor his progress. Ed continues to do well with exercise.  He is now on level 6 for XR and NS. Ed is set to graduate next week.  He improved his post some. He is planning to continue to exericse at [employer] in [place] using his Silver Sneakers. Ed was out on vacation this week.  He will graduate next week and go to [employer].   Expected Outcomes Short: attend consistently Long:  increase stamina Short: Continue to increase workloads Long; Continue to improve stamina. Short: Continue to increase workloads Long; Continue to improve stamina. Continue to exercise independently. Continue to exercise independently.   Row Name 01/18/20 1632 01/25/20 1253 02/23/20 1607         Exercise Goal Re-Evaluation   Exercise Goals Review Increase Physical Activity;Able to understand and use rate of perceived exertion (RPE) scale;Knowledge and understanding of Target Heart Rate Range (THRR);Understanding of Exercise Prescription;Increase Strength and Stamina;Able to check pulse independently;Able to understand  and use Dyspnea scale Increase Physical Activity;Increase Strength and Stamina;Understanding of Exercise Prescription --     Comments Reviewed RPE and dyspnea scales, THR and program prescription with pt today.  Pt voiced understanding and was given a copy of goals to take [name]. Ed has completed  his first two full days of exercise.  He is already at 1.9 METs on teh T5 NuStep.  We will continue to monitor his progress. Out since last review     Expected Outcomes Short: Use RPE daily to regulate intensity. Long: Follow program prescription in THR. Short: Continue to attend regularly  Long: Continue to follow program prescription --            Discharge Exercise Prescription (Final Exercise Prescription Changes):  Exercise Prescription Changes - 01/25/20 1200      Response to Exercise   Blood Pressure (Admit) 106/60    Blood Pressure (Exercise) 112/56    Blood Pressure (Exit) 110/62    Heart Rate (Admit) 63 bpm    Heart Rate (Exercise) 70 bpm    Heart Rate (Exit) 67 bpm    Oxygen Saturation (Admit) 95 %    Oxygen Saturation (Exercise) 94 %    Oxygen Saturation (Exit) 97 %    Rating of Perceived Exertion (Exercise) 12    Perceived Dyspnea (Exercise) 0    Symptoms none    Comments second full day of exericse    Duration Continue with 30 min of aerobic exercise without signs/symptoms of physical distress.    Intensity THRR unchanged      Progression   Progression Continue to progress workloads to maintain intensity without signs/symptoms of physical distress.    Average METs 2.1      Resistance Training   Training Prescription Yes    Weight 5 lb    Reps 10-15      Interval Training   Interval Training No      Treadmill   MPH 1.7    Grade 0    Minutes 15    METs 2.3      REL-XR   Level 3    Minutes 15      T5 Nustep   Level 1    Minutes 15    METs 1.9           Nutrition:  Target Goals: Understanding of nutrition guidelines, daily intake of sodium 1500mg , cholesterol 200mg , calories 30% from fat and 7% or less from saturated fats, daily to have 5 or more servings of fruits and vegetables.  Education: All About Nutrition: -Group instruction provided by verbal, written material, interactive activities, discussions, models, and posters to present general  guidelines for heart healthy nutrition including fat, fiber, MyPlate, the role of sodium in heart healthy nutrition, utilization of the nutrition label, and utilization of this knowledge for meal planning. Follow up email sent as well. Written material given at graduation.   Biometrics:  Pre Biometrics - 01/12/20 1502      Pre Biometrics   Height  (1.803 m)    Weight 199 lb 11.2 oz (90.6 kg)    BMI (Calculated) 27.86    Single Leg Stand 0 seconds           Post Biometrics - 10/26/19 1420       Post  Biometrics   Height 5' 11.1" (1.806 m)    Weight 208 lb (94.3 kg)    BMI (Calculated) 28.93    Single Leg Stand 3.26 seconds  Nutrition Therapy Plan and Nutrition Goals:  Nutrition Therapy & Goals - 01/25/20 1605      Nutrition Therapy   Diet Low Na, Heart Healthy, Diabetes Friendly, pulmonary MNT    Drug/Food Interactions Statins/Certain Fruits    Protein (specify units) 95g    Fiber 30 grams    Whole Grain Foods 3 servings    Saturated Fats 12 max. grams    Fruits and Vegetables 8 servings/day    Sodium 1.5 grams      Personal Nutrition Goals   Nutrition Goal ST: Protein (greek yogurt, cottage cheese, meat, fish, chicken, beans, eggs) with all meals, add two snacks before and after exercise (with protein and carbohydrates). LT: meet protein needs, gain muscle back from recent weight loss    Comments B: cheerios with banana and blueberries and milk (skim or 1% lactaid) L: bowl of soup and salad or antipasta salad with ham and cheese D: may go out and get pasta. - hasn't eaten red meat in a while. Pasta or salad - white pasta, he also like putanesca pasta with lots of vegetables. Salads: romaine with ranch dressing and sometimes a greek salad. Drink: coffee and water or seltzer. He reports no longer on potassium restriction. He reports still losing weight. He will have some peanut butter and crackers Discussed heart healthy eating as well as high protein MNT to  meet needs.      Intervention Plan   Intervention Prescribe, educate and counsel regarding individualized specific dietary modifications aiming towards targeted core components such as weight, hypertension, lipid management, diabetes, heart failure and other comorbidities.;Nutrition handout(s) given to patient.    Expected Outcomes Short Term Goal: Understand basic principles of dietary content, such as calories, fat, sodium, cholesterol and nutrients.;Short Term Goal: A plan has been developed with personal nutrition goals set during dietitian appointment.;Long Term Goal: Adherence to prescribed nutrition plan.           Nutrition Assessments:  Nutrition Assessments - 01/12/20 1505      MEDFICTS Scores   Pre Score 13          MEDIFICTS Score Key:  ?70 Need to make dietary changes   40-70 Heart Healthy Diet  ? 40 Therapeutic Level Cholesterol Diet   Picture Your Plate Scores:  <66 Unhealthy dietary pattern with much room for improvement.  41-50 Dietary pattern unlikely to meet recommendations for good health and room for improvement.  51-60 More healthful dietary pattern, with some room for improvement.   >60 Healthy dietary pattern, although there may be some specific behaviors that could be improved.   Nutrition Goals Re-Evaluation:  Nutrition Goals Re-Evaluation    Row Name 09/28/19 1415 10/26/19 1441           Goals   Nutrition Goal ST: substantial snack before and after exercise (bean salad) and bring applesauce to rehab, try to add greens to every dinner LT: Eat for heart and diabetes Better snacks and add more greens      Comment Discussed how to add more greens to meals as pt reports struggling with this. Ed is planning to continue to follow changes that he has already made.  He is working on snacking and adding in greens.      Expected Outcome ST: substantial snack before and after exercise (bean salad) and bring applesauce to rehab, try to add greens to every  dinner Continue to follow heart healthy changes already made.  Nutrition Goals Discharge (Final Nutrition Goals Re-Evaluation):  Nutrition Goals Re-Evaluation - 10/26/19 1441      Goals   Nutrition Goal Better snacks and add more greens    Comment Ed is planning to continue to follow changes that he has already made.  He is working on snacking and adding in greens.    Expected Outcome Continue to follow heart healthy changes already made.           Psychosocial: Target Goals: Acknowledge presence or absence of significant depression and/or stress, maximize coping skills, provide positive support system. Participant is able to verbalize types and ability to use techniques and skills needed for reducing stress and depression.   Education: Stress, Anxiety, and Depression - Group verbal and visual presentation to define topics covered.  Reviews how body is impacted by stress, anxiety, and depression.  Also discusses healthy ways to reduce stress and to treat/manage anxiety and depression.  Written material given at graduation.   Education: Sleep Hygiene -Provides group verbal and written instruction about how sleep can affect your health.  Define sleep hygiene, discuss sleep cycles and impact of sleep habits. Review good sleep hygiene tips.    Initial Review & Psychosocial Screening:  Initial Psych Review & Screening - 01/13/20 1406      Initial Review   Current issues with Current Stress Concerns    Source of Stress Concerns Chronic Illness      Family Dynamics   Good Support System? No   brother lives out of town, neighbors help when they can     Barriers   Psychosocial barriers to participate in program There are no identifiable barriers or psychosocial needs.;The patient should benefit from training in stress management and relaxation.      Screening Interventions   Interventions To provide support and resources with identified psychosocial needs;Provide feedback  about the scores to participant;Encouraged to exercise    Expected Outcomes Short Term goal: Utilizing psychosocial counselor, staff and physician to assist with identification of specific Stressors or current issues interfering with healing process. Setting desired goal for each stressor or current issue identified.;Long Term Goal: Stressors or current issues are controlled or eliminated.;Short Term goal: Identification and review with participant of any Quality of Life or Depression concerns found by scoring the questionnaire.;Long Term goal: The participant improves quality of Life and PHQ9 Scores as seen by post scores and/or verbalization of changes           Quality of Life Scores:  Quality of Life - 10/28/19 1424      Quality of Life Scores   Health/Function Pre 26.57 %    Health/Function Post 24.23 %    Health/Function % Change -8.81 %    Socioeconomic Pre 22.63 %    Socioeconomic Post 20 %    Socioeconomic % Change  -11.62 %    Psych/Spiritual Pre 29.14 %    Psych/Spiritual Post 24.2 %    Psych/Spiritual % Change -16.95 %    Family Pre 17.67 %    Family Post 13 %    Family % Change -26.43 %    GLOBAL Pre 25.72 %    GLOBAL Post 23.12 %    GLOBAL % Change -10.11 %          Scores of 19 and below usually indicate a poorer quality of life in these areas.  A difference of  2-3 points is a clinically meaningful difference.  A difference of 2-3 points in the total score  of the Quality of Life Index has been associated with significant improvement in overall quality of life, self-image, physical symptoms, and general health in studies assessing change in quality of life.  PHQ-9: Recent Review Flowsheet Data    Depression screen [facility] 2/9 01/12/2020 10/28/2019 09/09/2019 07/02/2019   Decreased Interest 3 2 1 2    Down, Depressed, Hopeless 0 0 0 1   PHQ - 2 Score 3 2 1 3    Altered sleeping 1  3 3 3    Tired, decreased energy 2 2 0 2   Change in appetite 1  1 0 1   Feeling bad or  failure about yourself  0 0 0 0   Trouble concentrating 0 1 0 0   Moving slowly or fidgety/restless 0 1 0 0   Suicidal thoughts 1  0 0 0   PHQ-9 Score 8 10 4 9    Difficult doing work/chores Not difficult at all Somewhat difficult Not difficult at all Not difficult at all     Interpretation of Total Score  Total Score Depression Severity:  1-4 = Minimal depression, 5-9 = Mild depression, 10-14 = Moderate depression, 15-19 = Moderately severe depression, 20-27 = Severe depression   Psychosocial Evaluation and Intervention:  Psychosocial Evaluation - 01/13/20 1407      Psychosocial Evaluation & Interventions   Interventions Stress management education;Relaxation education;Encouraged to exercise with the program and follow exercise prescription    Comments Ed is returning to rehab for pulmonary. He was doing well after graduation until his low sodium caused walking issues, hallucinations,etc. He was hospitalized and treated. He still has follow up labs coming up. Some stress caused by this and he has felt a decline in his stamina/mobility. He is wanting to come back to help gain back what he's lost    Expected Outcomes Short: attend pulmonary rehab for education and exercise. Long: develop and maintain positive self care habits.    Continue Psychosocial Services  Follow up required by staff           Psychosocial Re-Evaluation:   Psychosocial Discharge (Final Psychosocial Re-Evaluation):   Education: Education Goals: Education classes will be provided on a weekly basis, covering required topics. Participant will state understanding/return demonstration of topics presented.  Learning Barriers/Preferences:  Learning Barriers/Preferences - 01/13/20 1406      Learning Barriers/Preferences   Learning Barriers None    Learning Preferences None           General Pulmonary Education Topics:  Infection Prevention: - Provides verbal and written material to individual with discussion  of infection control including proper hand washing and proper equipment cleaning during exercise session. Flowsheet Row Cardiac Rehab from 01/13/2020 in [facility] Cardiac and Pulmonary Rehab  Date 01/12/20  Educator AS  Instruction Review Code 1- Verbalizes Understanding      Falls Prevention: - Provides verbal and written material to individual with discussion of falls prevention and safety. Flowsheet Row Cardiac Rehab from 01/13/2020 in [facility] Cardiac and Pulmonary Rehab  Date 01/12/20  Educator AS  Instruction Review Code 1- Verbalizes Understanding      Chronic Lung Disease Review: - Group verbal instruction with posters, models, PowerPoint presentations and videos,  to review new updates, new respiratory medications, new advancements in procedures and treatments. Providing information on websites and "800" numbers for continued self-education. Includes information about supplement oxygen, available portable oxygen systems, continuous and intermittent flow rates, oxygen safety, concentrators, and Medicare reimbursement for oxygen. Explanation of Pulmonary Drugs, including class, frequency, complications,  importance of spacers, rinsing mouth after steroid MDI's, and proper cleaning methods for nebulizers. Review of basic lung anatomy and physiology related to function, structure, and complications of lung disease. Review of risk factors. Discussion about methods for diagnosing sleep apnea and types of masks and machines for OSA. Includes a review of the use of types of environmental controls: [name] humidity, furnaces, filters, dust mite/pet prevention, HEPA vacuums. Discussion about weather changes, air quality and the benefits of nasal washing. Instruction on Warning signs, infection symptoms, calling MD promptly, preventive modes, and value of vaccinations. Review of effective airway clearance, coughing and/or vibration techniques. Emphasizing that all should Create an Action Plan. Written material  given at graduation.   AED/CPR: - Group verbal and written instruction with the use of models to demonstrate the basic use of the AED with the basic ABC's of resuscitation.    Anatomy and Cardiac Procedures: - Group verbal and visual presentation and models provide information about basic cardiac anatomy and function. Reviews the testing methods done to diagnose heart disease and the outcomes of the test results. Describes the treatment choices: Medical Management, Angioplasty, or Coronary Bypass Surgery for treating various heart conditions including Myocardial Infarction, Angina, Valve Disease, and Cardiac Arrhythmias.  Written material given at graduation.   Medication Safety: - Group verbal and visual instruction to review commonly prescribed medications for heart and lung disease. Reviews the medication, class of the drug, and side effects. Includes the steps to properly store meds and maintain the prescription regimen.  Written material given at graduation. Flowsheet Row Cardiac Rehab from 10/07/2019 in [facility] Cardiac and Pulmonary Rehab  Date 10/07/19  Educator SB  Instruction Review Code 1- Verbalizes Understanding      Other: -Provides group and verbal instruction on various topics (see comments)   Knowledge Questionnaire Score:  Knowledge Questionnaire Score - 01/12/20 1503      Knowledge Questionnaire Score   Pre Score 13/18 oxygen            Core Components/Risk Factors/Patient Goals at Admission:  Personal Goals and Risk Factors at Admission - 01/13/20 1405      Core Components/Risk Factors/Patient Goals on Admission    Weight Management Yes;Weight Loss    Intervention Weight Management: Develop a combined nutrition and exercise program designed to reach desired caloric intake, while maintaining appropriate intake of nutrient and fiber, sodium and fats, and appropriate energy expenditure required for the weight goal.;Weight Management: Provide education and  appropriate resources to help participant work on and attain dietary goals.    Expected Outcomes Short Term: Continue to assess and modify interventions until short term weight is achieved;Long Term: Adherence to nutrition and physical activity/exercise program aimed toward attainment of established weight goal;Weight Loss: Understanding of general recommendations for a balanced deficit meal plan, which promotes 1-2 lb weight loss per week and includes a negative energy balance of [phone] kcal/d;Understanding recommendations for meals to include 15-35% energy as protein, 25-35% energy from fat, 35-60% energy from carbohydrates, less than  of dietary cholesterol, 20-35 gm of total fiber daily;Understanding of distribution of calorie intake throughout the day with the consumption of 4-5 meals/snacks    Diabetes Yes    Intervention Provide education about signs/symptoms and action to take for hypo/hyperglycemia.;Provide education about proper nutrition, including hydration, and aerobic/resistive exercise prescription along with prescribed medications to achieve blood glucose in normal ranges: Fasting glucose 65-99 mg/dL    Expected Outcomes Short Term: Participant verbalizes understanding of the signs/symptoms and immediate care of hyper/hypoglycemia,  proper foot care and importance of medication, aerobic/resistive exercise and nutrition plan for blood glucose control.;Long Term: Attainment of HbA1C < 7%.    Heart Failure Yes    Intervention Provide a combined exercise and nutrition program that is supplemented with education, support and counseling about heart failure. Directed toward relieving symptoms such as shortness of breath, decreased exercise tolerance, and extremity edema.    Expected Outcomes Improve functional capacity of life;Short term: Attendance in program 2-3 days a week with increased exercise capacity. Reported lower sodium intake. Reported increased fruit and vegetable intake. Reports  medication compliance.;Short term: Daily weights obtained and reported for increase. Utilizing diuretic protocols set by physician.;Long term: Adoption of self-care skills and reduction of barriers for early signs and symptoms recognition and intervention leading to self-care maintenance.    Hypertension Yes    Intervention Provide education on lifestyle modifcations including regular physical activity/exercise, weight management, moderate sodium restriction and increased consumption of fresh fruit, vegetables, and low fat dairy, alcohol moderation, and smoking cessation.;Monitor prescription use compliance.    Expected Outcomes Short Term: Continued assessment and intervention until BP is < 140/6290mm HG in hypertensive participants. < 130/3180mm HG in hypertensive participants with diabetes, heart failure or chronic kidney disease.;Long Term: Maintenance of blood pressure at goal levels.    Lipids Yes    Intervention Provide education and support for participant on nutrition & aerobic/resistive exercise along with prescribed medications to achieve LDL 70mg , HDL >40mg .    Expected Outcomes Short Term: Participant states understanding of desired cholesterol values and is compliant with medications prescribed. Participant is following exercise prescription and nutrition guidelines.;Long Term: Cholesterol controlled with medications as prescribed, with individualized exercise RX and with personalized nutrition plan. Value goals: LDL < 70mg , HDL > 40 mg.           Education:Diabetes - Individual verbal and written instruction to review signs/symptoms of diabetes, desired ranges of glucose level fasting, after meals and with exercise. Acknowledge that pre and post exercise glucose checks will be done for 3 sessions at entry of program. Flowsheet [facility]  Date 01/13/20  [facility]  Instruction Review Code 1- Verbalizes Understanding      Know  Your Numbers and Heart Failure: - Group verbal and visual instruction to discuss disease risk factors for cardiac and pulmonary disease and treatment options.  Reviews associated critical values for Overweight/Obesity, Hypertension, Cholesterol, and Diabetes.  Discusses basics of heart failure: signs/symptoms and treatments.  Introduces Heart Failure Zone chart for action plan for heart failure.  Written material given at graduation.   Core Components/Risk Factors/Patient Goals Review:   Goals and Risk Factor Review    Row Name 10/26/19 1442             Core Components/Risk Factors/Patient Goals Review   Personal Goals Review Weight Management/Obesity;Hypertension;Diabetes;Lipids       Review Ed will continue to work on his weight and watching his numbers.  He feels he has been control and a good plan going forward.  He plans to continue to work on weight loss with exercise and diet.       Expected Outcomes Continue to exercise and monitor risk factors.              Core Components/Risk Factors/Patient Goals at Discharge (Final Review):   Goals and Risk Factor Review - 10/26/19 1442      Core Components/Risk Factors/Patient Goals Review   Personal Goals Review  Weight Management/Obesity;Hypertension;Diabetes;Lipids    Review Ed will continue to work on his weight and watching his numbers.  He feels he has been control and a good plan going forward.  He plans to continue to work on weight loss with exercise and diet.    Expected Outcomes Continue to exercise and monitor risk factors.           ITP Comments:  ITP Comments    Row Name 09/23/19 1053 10/21/19 0631 11/11/19 0827 01/13/20 0705 01/18/20 1630   ITP Comments 30 Day review completed. Medical Director ITP review done, changes made as directed, and signed approval by Medical Director. 30 Day review completed. Medical Director ITP review done, changes made as directed, and signed approval by Medical Director. Discharge ITP sent  and signed by Dr. Hyacinth Meeker.  Discharge Summary routed to PCP and cardiologist. 30 Day review completed. Medical Director ITP review done, changes made as directed, and signed approval by Medical Director. First full day of exercise!  Patient was oriented to gym and equipment including functions, settings, policies, and procedures.  Patient's individual exercise prescription and treatment plan were reviewed.  All starting workloads were established based on the results of the 6 minute walk test done at initial orientation visit.  The plan for exercise progression was also introduced and progression will be customized based on patient's performance and goals.   Row Name 02/03/20 1718 02/10/20 1038 02/11/20 1512 02/23/20 1607 03/08/20 1416   ITP Comments Spoke with patient who reported he is experiencing sciatic pain. He was prescribed Prednisone which he feels is helping- he reports he will be out next week for [facility] and is not sure when he will be back. Patient will plan to call us next week to keep [place] updated. 30 Day review completed. Medical Director ITP review done, changes made as directed, and signed approval by Medical Director. Ed has been out with sciatic pain since last review. Pt called on 12/8.  Ed is still waiting to find out how his Dr plans to follow up.  He states his back is about the same.  He doesn't feel he can exercise at this time.  He will call back when he finds out what is causing the pain. Ed has not returned to class   Row Name 03/09/20 0625           ITP Comments 30 Day review completed. Medical Director ITP review done, changes made as directed, and signed approval by Medical Director.  Has not attended any sessions in Dec              Comments: discharge ITP

## 2020-03-17 ENCOUNTER — Ambulatory Visit: Payer: Medicare HMO | Admitting: Family

## 2020-04-05 ENCOUNTER — Other Ambulatory Visit: Payer: Self-pay | Admitting: Physical Medicine & Rehabilitation

## 2020-04-05 DIAGNOSIS — G8929 Other chronic pain: Secondary | ICD-10-CM

## 2020-04-05 DIAGNOSIS — M5441 Lumbago with sciatica, right side: Secondary | ICD-10-CM

## 2020-04-07 ENCOUNTER — Emergency Department: Payer: Medicare HMO

## 2020-04-07 ENCOUNTER — Inpatient Hospital Stay
Admission: EM | Admit: 2020-04-07 | Discharge: 2020-04-11 | DRG: 640 | Disposition: A | Discharge status: Home Health | Payer: Medicare HMO | Attending: Internal Medicine | Admitting: Internal Medicine

## 2020-04-07 DIAGNOSIS — K219 Gastro-esophageal reflux disease without esophagitis: Secondary | ICD-10-CM | POA: Diagnosis present

## 2020-04-07 DIAGNOSIS — E860 Dehydration: Secondary | ICD-10-CM | POA: Diagnosis not present

## 2020-04-07 DIAGNOSIS — Z7901 Long term (current) use of anticoagulants: Secondary | ICD-10-CM

## 2020-04-07 DIAGNOSIS — R296 Repeated falls: Secondary | ICD-10-CM | POA: Diagnosis present

## 2020-04-07 DIAGNOSIS — K5900 Constipation, unspecified: Secondary | ICD-10-CM | POA: Diagnosis present

## 2020-04-07 DIAGNOSIS — Z79899 Other long term (current) drug therapy: Secondary | ICD-10-CM

## 2020-04-07 DIAGNOSIS — E1142 Type 2 diabetes mellitus with diabetic polyneuropathy: Secondary | ICD-10-CM | POA: Diagnosis present

## 2020-04-07 DIAGNOSIS — I252 Old myocardial infarction: Secondary | ICD-10-CM

## 2020-04-07 DIAGNOSIS — Z7984 Long term (current) use of oral hypoglycemic drugs: Secondary | ICD-10-CM

## 2020-04-07 DIAGNOSIS — R7989 Other specified abnormal findings of blood chemistry: Secondary | ICD-10-CM

## 2020-04-07 DIAGNOSIS — R531 Weakness: Secondary | ICD-10-CM

## 2020-04-07 DIAGNOSIS — G9341 Metabolic encephalopathy: Secondary | ICD-10-CM | POA: Diagnosis present

## 2020-04-07 DIAGNOSIS — K529 Noninfective gastroenteritis and colitis, unspecified: Secondary | ICD-10-CM | POA: Diagnosis present

## 2020-04-07 DIAGNOSIS — I5042 Chronic combined systolic (congestive) and diastolic (congestive) heart failure: Secondary | ICD-10-CM | POA: Diagnosis present

## 2020-04-07 DIAGNOSIS — Z20822 Contact with and (suspected) exposure to covid-19: Secondary | ICD-10-CM | POA: Diagnosis present

## 2020-04-07 DIAGNOSIS — I480 Paroxysmal atrial fibrillation: Secondary | ICD-10-CM | POA: Diagnosis present

## 2020-04-07 DIAGNOSIS — W19XXXA Unspecified fall, initial encounter: Secondary | ICD-10-CM

## 2020-04-07 DIAGNOSIS — Z87891 Personal history of nicotine dependence: Secondary | ICD-10-CM

## 2020-04-07 DIAGNOSIS — Z951 Presence of aortocoronary bypass graft: Secondary | ICD-10-CM

## 2020-04-07 DIAGNOSIS — R627 Adult failure to thrive: Secondary | ICD-10-CM | POA: Diagnosis present

## 2020-04-07 DIAGNOSIS — R4182 Altered mental status, unspecified: Secondary | ICD-10-CM | POA: Diagnosis present

## 2020-04-07 DIAGNOSIS — E785 Hyperlipidemia, unspecified: Secondary | ICD-10-CM | POA: Diagnosis present

## 2020-04-07 DIAGNOSIS — F32A Depression, unspecified: Secondary | ICD-10-CM | POA: Diagnosis present

## 2020-04-07 DIAGNOSIS — K2970 Gastritis, unspecified, without bleeding: Secondary | ICD-10-CM | POA: Diagnosis present

## 2020-04-07 DIAGNOSIS — I110 Hypertensive heart disease with heart failure: Secondary | ICD-10-CM | POA: Diagnosis present

## 2020-04-07 DIAGNOSIS — I2510 Atherosclerotic heart disease of native coronary artery without angina pectoris: Secondary | ICD-10-CM | POA: Diagnosis present

## 2020-04-07 DIAGNOSIS — E872 Acidosis: Secondary | ICD-10-CM | POA: Diagnosis present

## 2020-04-07 DIAGNOSIS — Z96653 Presence of artificial knee joint, bilateral: Secondary | ICD-10-CM | POA: Diagnosis present

## 2020-04-07 LAB — COMPREHENSIVE METABOLIC PANEL
ALT: 25 U/L (ref 0–44)
AST: 28 U/L (ref 15–41)
Albumin: 3.1 g/dL — ABNORMAL LOW (ref 3.5–5.0)
Alkaline Phosphatase: 51 U/L (ref 38–126)
Anion gap: 13 (ref 5–15)
BUN: 27 mg/dL — ABNORMAL HIGH (ref 8–23)
CO2: 21 mmol/L — ABNORMAL LOW (ref 22–32)
Calcium: 8 mg/dL — ABNORMAL LOW (ref 8.9–10.3)
Chloride: 105 mmol/L (ref 98–111)
Creatinine, Ser: 0.88 mg/dL (ref 0.61–1.24)
GFR, Estimated: >60 mL/min (ref >60)
Glucose, Bld: 122 mg/dL — ABNORMAL HIGH (ref 70–99)
Potassium: 3.8 mmol/L (ref 3.5–5.1)
Sodium: 139 mmol/L (ref 135–145)
Total Bilirubin: 1.5 mg/dL — ABNORMAL HIGH (ref 0.3–1.2)
Total Protein: 5.9 g/dL — ABNORMAL LOW (ref 6.5–8.1)

## 2020-04-07 LAB — CBC WITH DIFFERENTIAL/PLATELET
Abs Immature Granulocytes: 0.02 10*3/uL (ref 0.00–0.07)
Basophils Absolute: 0 10*3/uL (ref 0.0–0.1)
Basophils Relative: 1 %
Eosinophils Absolute: 0 10*3/uL (ref 0.0–0.5)
Eosinophils Relative: 0 %
HCT: 40.7 % (ref 39.0–52.0)
Hemoglobin: 13.5 g/dL (ref 13.0–17.0)
Immature Granulocytes: 0 %
Lymphocytes Relative: 7 %
Lymphs Abs: 0.4 10*3/uL — ABNORMAL LOW (ref 0.7–4.0)
MCH: 30.9 pg (ref 26.0–34.0)
MCHC: 33.2 g/dL (ref 30.0–36.0)
MCV: 93.1 fL (ref 80.0–100.0)
Monocytes Absolute: 1 10*3/uL (ref 0.1–1.0)
Monocytes Relative: 16 %
Neutro Abs: 4.8 10*3/uL (ref 1.7–7.7)
Neutrophils Relative %: 76 %
Platelets: 241 10*3/uL (ref 150–400)
RBC: 4.37 MIL/uL (ref 4.22–5.81)
RDW: 14.6 % (ref 11.5–15.5)
WBC: 6.3 10*3/uL (ref 4.0–10.5)
nRBC: 0 % (ref 0.0–0.2)

## 2020-04-07 LAB — SARS CORONAVIRUS 2 BY RT PCR (HOSPITAL ORDER, PERFORMED IN ~~LOC~~ HOSPITAL LAB): SARS Coronavirus 2: NEGATIVE

## 2020-04-07 LAB — LACTIC ACID, PLASMA: Lactic Acid, Venous: 2.9 mmol/L (ref 0.5–1.9)

## 2020-04-07 LAB — TROPONIN I (HIGH SENSITIVITY): Troponin I (High Sensitivity): 6 ng/L (ref <18)

## 2020-04-07 MED ORDER — LACTATED RINGERS IV BOLUS
1000.0000 mL | Freq: Once | INTRAVENOUS | Status: AC
  Administered 2020-04-07: 1000 mL via INTRAVENOUS

## 2020-04-07 NOTE — ED Provider Notes (Signed)
[facility] Emergency Department Provider Note   ____________________________________________   Event Date/Time   First MD Initiated Contact with Patient 04/07/20 2159     (approximate)  I have reviewed the triage vital signs and the nursing notes.   HISTORY  Chief Complaint Fall and Altered Mental Status    HPI Devin [name]. is a 75 y.o. male with past medical history of hypertension, diabetes, CAD status post CABG, CHF, and atrial fibrillation on Eliquis who presents to the ED for generalized weakness.  Patient reports that he has fallen twice today and each time his legs seem to give out on him, causing him to fall to the ground.  He denies hitting his head or losing consciousness, states he was not feeling lightheaded or dizzy at the time.  He has not had any associated chest pain or shortness of breath, denies recent fevers or cough.  He does state that he was feeling nauseous with some vomiting and diarrhea yesterday, but this resolved today.  He does report some dark urine recently, but denies any dysuria.  EMS reports patient's initial blood pressure was 76/50, he was subsequently given 500 cc of IV fluids and it improved to 103/56.  EMS also reports that patient seemed disoriented at times during transport.        Past Medical History:  Diagnosis Date  . Arrhythmia    atrial fibrillation  . CAD (coronary artery disease)   . CHF (congestive heart failure) (HCC)   . Diabetes mellitus without complication (HCC)   . Hypertension   . MI, old   . Sleep apnea     Patient Active Problem List   Diagnosis Date Noted  . Hypoxia   . Congestive heart failure (CHF) (HCC) 12/13/2019  . Hyponatremia 12/06/2019  . Chest pain 05/21/2019  . Unstable angina (HCC)   . Ischemic chest pain (HCC) 05/16/2019  . CAD (coronary artery disease) 05/16/2019  . Type 2 diabetes mellitus without complication (HCC) 05/16/2019  . Essential hypertension  05/16/2019  . Depression 05/16/2019  . GERD (gastroesophageal reflux disease) 05/16/2019    Past Surgical History:  Procedure Laterality Date  . APPENDECTOMY    . BREAST SURGERY     benign mass  . CARDIOVERSION Right 12/15/2019   Procedure: CARDIOVERSION;  Surgeon: Laurier Nancy, MD;  Location: [facility] ORS;  Service: Cardiovascular;  Laterality: Right;  . CORONARY ANGIOPLASTY WITH STENT PLACEMENT    . LEFT HEART CATH AND CORS/GRAFTS ANGIOGRAPHY N/A 05/21/2019   Procedure: LEFT HEART CATH AND CORONARY ANGIOGRAPHY;  Surgeon: Laurier Nancy, MD;  Location: [facility] INVASIVE CV LAB;  Service: Cardiovascular;  Laterality: N/A;  . REPLACEMENT TOTAL KNEE BILATERAL      Prior to Admission medications   Medication Sig Start Date End Date Taking? Authorizing Provider  amiodarone (PACERONE) 400 MG tablet Take 1 tablet (400 mg total) by mouth daily. Patient not taking: Reported on 01/11/2020 12/16/19   Arnetha Courser, MD  apixaban (ELIQUIS) 5 MG TABS tablet Take 5 mg by mouth 2 (two) times daily.     [provider]  atorvastatin (LIPITOR) 80 MG tablet Take 1 tablet (80 mg total) by mouth daily. 05/16/19 05/15/20  Lucile Shutters, MD  buPROPion (WELLBUTRIN XL) 300 MG 24 hr tablet Take 300 mg by mouth daily.     [provider]  carvedilol (COREG) 25 MG tablet Take 1 tablet (25 mg total) by mouth 2 (two) times daily. 05/16/19 05/15/20  Lucile Shutters, MD  ENTRESTO 49-51 MG Take 1 tablet by mouth 2 (two) times daily. 11/09/19   [provider]  EPINEPHrine 0.3 mg/0.3 mL IJ SOAJ injection Inject 0.3 mg into the muscle as needed for anaphylaxis.    [provider]  ezetimibe (ZETIA) 10 MG tablet Take 10 mg by mouth daily. 04/06/19   [provider]  furosemide (LASIX) 20 MG tablet Take 1 tablet (20 mg total) by mouth daily. Patient not taking: Reported on 01/11/2020 12/15/19 12/14/20  Arnetha Courser, MD  gabapentin (NEURONTIN) 300 MG capsule Take 300 mg by mouth 2 (two) times  daily. 05/01/19   [provider]  metFORMIN (GLUCOPHAGE) 500 MG tablet Take 500 mg by mouth 2 (two) times daily. 02/17/19   [provider]  nitroGLYCERIN (NITROSTAT) 0.4 MG SL tablet Place 0.4 mg under the tongue every 5 (five) minutes as needed for chest pain.    [provider]  sertraline (ZOLOFT) 25 MG tablet Take 25 mg by mouth daily. 11/27/19   [provider]  sildenafil (VIAGRA) 50 MG tablet Take 50 mg by mouth daily as needed for erectile dysfunction.    [provider]  benazepril (LOTENSIN) 20 MG tablet Take 20 mg by mouth daily. 03/19/19 08/13/19  [provider]    Allergies Ace inhibitors, Morphine, and Yellow jacket venom [bee venom]  Family History  Problem Relation Age of Onset  . Osteoarthritis Mother   . Other Father 56       MVA    Social History Social History   Tobacco Use  . Smoking status: Former Games developer  . Smokeless tobacco: Never Used  . Tobacco comment: Quit over 40 years ago  Vaping Use  . Vaping Use: Never used  Substance Use Topics  . Alcohol use: Not Currently    Comment: social  . Drug use: Never    Review of Systems  Constitutional: No fever/chills.  Positive for generalized weakness. Eyes: No visual changes. ENT: No sore throat. Cardiovascular: Denies chest pain. Respiratory: Denies shortness of breath. Gastrointestinal: No abdominal pain.  No nausea, no vomiting.  No diarrhea.  No constipation. Genitourinary: Negative for dysuria. Musculoskeletal: Negative for back pain. Skin: Negative for rash. Neurological: Negative for headaches, focal weakness or numbness.  ____________________________________________   PHYSICAL EXAM:  VITAL SIGNS: ED Triage Vitals  Enc Vitals Group     BP 04/07/20 2158 (!) 112/58     Pulse Rate 04/07/20 2158 73     Resp 04/07/20 2158 20     Temp 04/07/20 2158 97.9 F (36.6 C)     Temp Source 04/07/20 2158 Oral     SpO2 04/07/20 2158 100 %     Weight --       Height --      Head Circumference --      Peak Flow --      Pain Score 04/07/20 2154 0     Pain Loc --      Pain Edu? --      Excl. in GC? --     Constitutional: Alert and oriented to person, place, and time. Eyes: Conjunctivae are normal.  Pupils equal round and reactive to light bilaterally. Head: Atraumatic. Nose: No congestion/rhinnorhea. Mouth/Throat: Mucous membranes are moist. Neck: Normal ROM, no midline cervical spine tenderness. Cardiovascular: Normal rate, regular rhythm. Grossly normal heart sounds. Respiratory: Normal respiratory effort.  No retractions. Lungs CTAB. Gastrointestinal: Soft and nontender. No distention. Genitourinary: deferred Musculoskeletal: No lower extremity tenderness nor edema. Neurologic:  Normal  speech and language. No gross focal neurologic deficits are appreciated. Skin:  Skin is warm, dry and intact. No rash noted. Psychiatric: Mood and affect are normal. Speech and behavior are normal.  ____________________________________________   LABS (all labs ordered are listed, but only abnormal results are displayed)  Labs Reviewed  LACTIC ACID, PLASMA - Abnormal; Notable for the following components:      Result Value   Lactic Acid, Venous 2.9 (*)    All other components within normal limits  CBC WITH DIFFERENTIAL/PLATELET - Abnormal; Notable for the following components:   Lymphs Abs 0.4 (*)    All other components within normal limits  COMPREHENSIVE METABOLIC PANEL - Abnormal; Notable for the following components:   CO2 21 (*)    Glucose, Bld 122 (*)    BUN 27 (*)    Calcium 8.0 (*)    Total Protein 5.9 (*)    Albumin 3.1 (*)    Total Bilirubin 1.5 (*)    All other components within normal limits  CULTURE, BLOOD (ROUTINE X 2)  CULTURE, BLOOD (ROUTINE X 2)  SARS CORONAVIRUS 2 BY RT PCR (HOSPITAL ORDER, PERFORMED IN [facility])  LACTIC ACID, PLASMA  URINALYSIS, COMPLETE (UACMP) WITH MICROSCOPIC  TROPONIN I (HIGH  SENSITIVITY)   ____________________________________________  EKG  ED ECG REPORT I, Chesley Noon, the attending physician, personally viewed and interpreted this ECG.   Date: 04/07/2020  EKG Time: 21:54  Rate: 74  Rhythm: normal sinus rhythm  Axis: Normal  Intervals:none  ST&T Change: None   PROCEDURES  Procedure(s) performed (including Critical Care):  Procedures   ____________________________________________   INITIAL IMPRESSION / ASSESSMENT AND PLAN / ED COURSE       75 year old male with past medical history of hypertension, diabetes, CAD status post CABG, CHF, and atrial fibrillation on Eliquis who presents to the ED with multiple falls and generalized weakness today.  He denies hitting his head or losing consciousness, but given his anticoagulated status we will check CT head and cervical spine.  Blood pressure is improved following 500 cc of IV fluids by EMS, we will continue further IV fluid hydration.  Differential includes infectious process, ACS, arrhythmia, electrolyte abnormality, stroke.  Patient does report a history of hyponatremia in the past.  Initial labs remarkable for elevated lactic acid as well as increased BUN to creatinine ratio.  This could be related to dehydration as labs are otherwise reassuring, no leukocytosis noted.  Chest x-ray reviewed by me and shows no infiltrate, edema, or effusion.  UA is pending but no obvious infectious process at this time.  Patient turned over to oncoming provider, if infectious and cardiac work-up is unremarkable and BP remains improved, patient be appropriate for discharge home with PCP follow-up.      ____________________________________________   FINAL CLINICAL IMPRESSION(S) / ED DIAGNOSES  Final diagnoses:  Generalized weakness     ED Discharge Orders    None       Note:  This document was prepared using Dragon voice recognition software and may include unintentional dictation errors.   Chesley Noon, MD 04/07/20 2303

## 2020-04-07 NOTE — ED Notes (Signed)
Date and time results received: 04/07/20 10:40 PM (use smartphrase ".now" to insert current time)  Test: Lactic Acid Critical Value: 2.9  Name of Provider Notified: Dr. Larinda Buttery  Orders Received? Or Actions Taken?: No new orders at this time.

## 2020-04-07 NOTE — ED Provider Notes (Signed)
  Physical Exam  BP (!) 112/58 (BP Location: Left Arm)   Pulse 73   Temp 97.9 F (36.6 C) (Oral)   Resp 20   SpO2 100%   Physical Exam  ED Course/Procedures     Procedures  MDM  Assumed care.  Patient is a 75 y.o. M who presents to ED with a fall today.  Reports generalized weakness and feeling like his legs gave out on him today.  Did have 2 falls.  Confused with EMS.  No focal neurologic deficits.  Labs show elevated lactate.  Getting IV fluids.  He is afebrile.  Chest x-ray clear.  CTs of the head, cervical spine showed no acute abnormality.  Patient awaiting urinalysis.    2:15 AM  Pt's repeat lactate has improved with IV hydration but still elevated at 2.5.  Urine shows no sign of infection.  Covid test negative.  He is tolerating p.o. here.  Will ambulate.  3:00 AM  Pt intermittently confused per nurse.  No focal neuro deficits on my exam.  Unable to even stand here in ED which he states is new.  Lives at home alone with assistance 3 days a week.  Uses walker at baseline.  Will admit for generalized weakness and fluctuating AMS.   3:19 AM Discussed patient's case with hospitalist, Dr. Arville Care.  I have recommended admission and patient (and family if present) agree with this plan. Admitting physician will place admission orders.   I reviewed all nursing notes, vitals, pertinent previous records and reviewed/interpreted all EKGs, lab and urine results, imaging (as available).      Lamia Mariner, Layla Maw, DO 04/08/20 [phone]

## 2020-04-07 NOTE — ED Triage Notes (Signed)
Pt BIBA via [facility] c/o of fall. EMS reports that pt has fallen 2x today and fire dept responded earlier in the day. Pt denies hitting head or losing any consciousness. Ems states pt lives by self. Pt vitals pta: BP 76/50 (before 500 cc's fluid), after fluid 103/56, 93 hr, cbg 154. Pt has hx of CABG.

## 2020-04-08 ENCOUNTER — Other Ambulatory Visit: Payer: Self-pay

## 2020-04-08 ENCOUNTER — Encounter: Payer: Self-pay | Admitting: Family Medicine

## 2020-04-08 DIAGNOSIS — Z7189 Other specified counseling: Secondary | ICD-10-CM | POA: Diagnosis not present

## 2020-04-08 DIAGNOSIS — R531 Weakness: Secondary | ICD-10-CM

## 2020-04-08 DIAGNOSIS — G9341 Metabolic encephalopathy: Secondary | ICD-10-CM | POA: Diagnosis present

## 2020-04-08 DIAGNOSIS — K529 Noninfective gastroenteritis and colitis, unspecified: Secondary | ICD-10-CM | POA: Diagnosis present

## 2020-04-08 DIAGNOSIS — Z7984 Long term (current) use of oral hypoglycemic drugs: Secondary | ICD-10-CM | POA: Diagnosis not present

## 2020-04-08 DIAGNOSIS — E872 Acidosis: Secondary | ICD-10-CM | POA: Diagnosis present

## 2020-04-08 DIAGNOSIS — F32A Depression, unspecified: Secondary | ICD-10-CM | POA: Diagnosis present

## 2020-04-08 DIAGNOSIS — Y92009 Unspecified place in unspecified non-institutional (private) residence as the place of occurrence of the external cause: Secondary | ICD-10-CM | POA: Diagnosis not present

## 2020-04-08 DIAGNOSIS — R296 Repeated falls: Secondary | ICD-10-CM | POA: Diagnosis present

## 2020-04-08 DIAGNOSIS — Z96653 Presence of artificial knee joint, bilateral: Secondary | ICD-10-CM | POA: Diagnosis present

## 2020-04-08 DIAGNOSIS — I252 Old myocardial infarction: Secondary | ICD-10-CM | POA: Diagnosis not present

## 2020-04-08 DIAGNOSIS — Z951 Presence of aortocoronary bypass graft: Secondary | ICD-10-CM | POA: Diagnosis not present

## 2020-04-08 DIAGNOSIS — E1142 Type 2 diabetes mellitus with diabetic polyneuropathy: Secondary | ICD-10-CM | POA: Diagnosis present

## 2020-04-08 DIAGNOSIS — R4182 Altered mental status, unspecified: Secondary | ICD-10-CM | POA: Diagnosis not present

## 2020-04-08 DIAGNOSIS — Z20822 Contact with and (suspected) exposure to covid-19: Secondary | ICD-10-CM | POA: Diagnosis present

## 2020-04-08 DIAGNOSIS — I5042 Chronic combined systolic (congestive) and diastolic (congestive) heart failure: Secondary | ICD-10-CM | POA: Diagnosis present

## 2020-04-08 DIAGNOSIS — R7989 Other specified abnormal findings of blood chemistry: Secondary | ICD-10-CM | POA: Diagnosis not present

## 2020-04-08 DIAGNOSIS — K219 Gastro-esophageal reflux disease without esophagitis: Secondary | ICD-10-CM | POA: Diagnosis present

## 2020-04-08 DIAGNOSIS — E860 Dehydration: Secondary | ICD-10-CM | POA: Diagnosis present

## 2020-04-08 DIAGNOSIS — K5900 Constipation, unspecified: Secondary | ICD-10-CM | POA: Diagnosis present

## 2020-04-08 DIAGNOSIS — I110 Hypertensive heart disease with heart failure: Secondary | ICD-10-CM | POA: Diagnosis present

## 2020-04-08 DIAGNOSIS — K2970 Gastritis, unspecified, without bleeding: Secondary | ICD-10-CM | POA: Diagnosis present

## 2020-04-08 DIAGNOSIS — Z7901 Long term (current) use of anticoagulants: Secondary | ICD-10-CM | POA: Diagnosis not present

## 2020-04-08 DIAGNOSIS — Z79899 Other long term (current) drug therapy: Secondary | ICD-10-CM | POA: Diagnosis not present

## 2020-04-08 DIAGNOSIS — R627 Adult failure to thrive: Secondary | ICD-10-CM | POA: Diagnosis present

## 2020-04-08 DIAGNOSIS — Z87891 Personal history of nicotine dependence: Secondary | ICD-10-CM | POA: Diagnosis not present

## 2020-04-08 DIAGNOSIS — E785 Hyperlipidemia, unspecified: Secondary | ICD-10-CM | POA: Diagnosis present

## 2020-04-08 DIAGNOSIS — I2510 Atherosclerotic heart disease of native coronary artery without angina pectoris: Secondary | ICD-10-CM | POA: Diagnosis present

## 2020-04-08 DIAGNOSIS — W19XXXD Unspecified fall, subsequent encounter: Secondary | ICD-10-CM | POA: Diagnosis not present

## 2020-04-08 DIAGNOSIS — I480 Paroxysmal atrial fibrillation: Secondary | ICD-10-CM | POA: Diagnosis present

## 2020-04-08 DIAGNOSIS — R410 Disorientation, unspecified: Secondary | ICD-10-CM | POA: Diagnosis not present

## 2020-04-08 LAB — CBC
HCT: 40.6 % (ref 39.0–52.0)
Hemoglobin: 13.7 g/dL (ref 13.0–17.0)
MCH: 30.6 pg (ref 26.0–34.0)
MCHC: 33.7 g/dL (ref 30.0–36.0)
MCV: 90.6 fL (ref 80.0–100.0)
Platelets: 256 10*3/uL (ref 150–400)
RBC: 4.48 MIL/uL (ref 4.22–5.81)
RDW: 14.6 % (ref 11.5–15.5)
WBC: 6.4 10*3/uL (ref 4.0–10.5)
nRBC: 0 % (ref 0.0–0.2)

## 2020-04-08 LAB — URINALYSIS, COMPLETE (UACMP) WITH MICROSCOPIC
Bacteria, UA: NONE SEEN
Bilirubin Urine: NEGATIVE
Glucose, UA: NEGATIVE mg/dL
Hgb urine dipstick: NEGATIVE
Ketones, ur: NEGATIVE mg/dL
Leukocytes,Ua: NEGATIVE
Nitrite: NEGATIVE
Protein, ur: NEGATIVE mg/dL
Specific Gravity, Urine: 1.023 (ref 1.005–1.030)
Squamous Epithelial / HPF: NONE SEEN (ref 0–5)
pH: 5 (ref 5.0–8.0)

## 2020-04-08 LAB — BASIC METABOLIC PANEL
Anion gap: 13 (ref 5–15)
BUN: 28 mg/dL — ABNORMAL HIGH (ref 8–23)
CO2: 24 mmol/L (ref 22–32)
Calcium: 9.1 mg/dL (ref 8.9–10.3)
Chloride: 99 mmol/L (ref 98–111)
Creatinine, Ser: 0.93 mg/dL (ref 0.61–1.24)
GFR, Estimated: >60 mL/min (ref >60)
Glucose, Bld: 136 mg/dL — ABNORMAL HIGH (ref 70–99)
Potassium: 4 mmol/L (ref 3.5–5.1)
Sodium: 136 mmol/L (ref 135–145)

## 2020-04-08 LAB — LACTIC ACID, PLASMA
Lactic Acid, Venous: 2.5 mmol/L (ref 0.5–1.9)
Lactic Acid, Venous: 1.4 mmol/L (ref 0.5–1.9)

## 2020-04-08 LAB — GLUCOSE, CAPILLARY: Glucose-Capillary: 89 mg/dL (ref 70–99)

## 2020-04-08 LAB — CBG MONITORING, ED: Glucose-Capillary: 85 mg/dL (ref 70–99)

## 2020-04-08 MED ORDER — BUPROPION HCL ER (XL) 150 MG PO TB24
300.0000 mg | ORAL_TABLET | Freq: Every day | ORAL | Status: DC
  Administered 2020-04-09 – 2020-04-11 (×3): 300 mg via ORAL
  Filled 2020-04-08 (×4): qty 2

## 2020-04-08 MED ORDER — ASPIRIN EC 81 MG PO TBEC
81.0000 mg | DELAYED_RELEASE_TABLET | Freq: Every day | ORAL | Status: DC
  Administered 2020-04-09 – 2020-04-11 (×3): 81 mg via ORAL
  Filled 2020-04-08 (×3): qty 1

## 2020-04-08 MED ORDER — MAGNESIUM HYDROXIDE 400 MG/5ML PO SUSP
30.0000 mL | Freq: Every day | ORAL | Status: DC | PRN
  Administered 2020-04-10: 30 mL via ORAL
  Filled 2020-04-08: qty 30

## 2020-04-08 MED ORDER — EZETIMIBE 10 MG PO TABS
10.0000 mg | ORAL_TABLET | Freq: Every day | ORAL | Status: DC
  Administered 2020-04-09 – 2020-04-11 (×3): 10 mg via ORAL
  Filled 2020-04-08 (×5): qty 1

## 2020-04-08 MED ORDER — FUROSEMIDE 20 MG PO TABS
20.0000 mg | ORAL_TABLET | Freq: Every day | ORAL | Status: DC
  Administered 2020-04-09: 20 mg via ORAL
  Filled 2020-04-08: qty 1

## 2020-04-08 MED ORDER — ONDANSETRON HCL 4 MG/2ML IJ SOLN
4.0000 mg | Freq: Four times a day (QID) | INTRAMUSCULAR | Status: DC | PRN
  Administered 2020-04-08 – 2020-04-10 (×3): 4 mg via INTRAVENOUS
  Filled 2020-04-08 (×3): qty 2

## 2020-04-08 MED ORDER — CARVEDILOL 25 MG PO TABS
25.0000 mg | ORAL_TABLET | Freq: Two times a day (BID) | ORAL | Status: DC
  Administered 2020-04-08 – 2020-04-11 (×6): 25 mg via ORAL
  Filled 2020-04-08 (×6): qty 1

## 2020-04-08 MED ORDER — INSULIN ASPART 100 UNIT/ML ~~LOC~~ SOLN: 0.0000 [IU] | Freq: Three times a day (TID) | SUBCUTANEOUS | Status: DC

## 2020-04-08 MED ORDER — ONDANSETRON HCL 4 MG/2ML IJ SOLN
4.0000 mg | Freq: Once | INTRAMUSCULAR | Status: AC
  Administered 2020-04-08: 4 mg via INTRAVENOUS
  Filled 2020-04-08: qty 2

## 2020-04-08 MED ORDER — LACTATED RINGERS IV SOLN: INTRAVENOUS | Status: DC

## 2020-04-08 MED ORDER — PROMETHAZINE HCL 25 MG/ML IJ SOLN
12.5000 mg | Freq: Four times a day (QID) | INTRAMUSCULAR | Status: DC | PRN
  Administered 2020-04-08 – 2020-04-09 (×2): 12.5 mg via INTRAVENOUS
  Filled 2020-04-08 (×2): qty 1

## 2020-04-08 MED ORDER — SACUBITRIL-VALSARTAN 49-51 MG PO TABS
1.0000 | ORAL_TABLET | Freq: Two times a day (BID) | ORAL | Status: DC
  Administered 2020-04-09 – 2020-04-11 (×5): 1 via ORAL
  Filled 2020-04-08 (×9): qty 1

## 2020-04-08 MED ORDER — SERTRALINE HCL 50 MG PO TABS
25.0000 mg | ORAL_TABLET | Freq: Every day | ORAL | Status: DC
  Administered 2020-04-09 – 2020-04-11 (×3): 25 mg via ORAL
  Filled 2020-04-08 (×3): qty 1

## 2020-04-08 MED ORDER — ATORVASTATIN CALCIUM 20 MG PO TABS
80.0000 mg | ORAL_TABLET | Freq: Every day | ORAL | Status: DC
  Administered 2020-04-08 – 2020-04-10 (×3): 80 mg via ORAL
  Filled 2020-04-08 (×3): qty 4

## 2020-04-08 MED ORDER — AMIODARONE HCL 200 MG PO TABS
200.0000 mg | ORAL_TABLET | Freq: Every day | ORAL | Status: DC
  Administered 2020-04-09 – 2020-04-11 (×3): 200 mg via ORAL
  Filled 2020-04-08 (×4): qty 1

## 2020-04-08 MED ORDER — EPINEPHRINE 0.3 MG/0.3ML IJ SOAJ
0.3000 mg | Freq: Once | INTRAMUSCULAR | Status: DC | PRN
  Filled 2020-04-08: qty 0.3

## 2020-04-08 MED ORDER — DOCUSATE SODIUM 100 MG PO CAPS
100.0000 mg | ORAL_CAPSULE | Freq: Every day | ORAL | Status: DC
Start: 2020-04-08 — End: 2020-04-11
  Administered 2020-04-09 – 2020-04-11 (×3): 100 mg via ORAL
  Filled 2020-04-08 (×3): qty 1

## 2020-04-08 MED ORDER — GABAPENTIN 300 MG PO CAPS
300.0000 mg | ORAL_CAPSULE | Freq: Two times a day (BID) | ORAL | Status: DC
  Administered 2020-04-08 – 2020-04-11 (×6): 300 mg via ORAL
  Filled 2020-04-08 (×6): qty 1

## 2020-04-08 MED ORDER — CYCLOBENZAPRINE HCL 10 MG PO TABS: 10.0000 mg | ORAL_TABLET | Freq: Two times a day (BID) | ORAL | Status: DC | PRN

## 2020-04-08 MED ORDER — ENOXAPARIN SODIUM 40 MG/0.4ML ~~LOC~~ SOLN: 40.0000 mg | SUBCUTANEOUS | Status: DC

## 2020-04-08 MED ORDER — ACETAMINOPHEN 650 MG RE SUPP: 650.0000 mg | Freq: Four times a day (QID) | RECTAL | Status: DC | PRN

## 2020-04-08 MED ORDER — APIXABAN 5 MG PO TABS
5.0000 mg | ORAL_TABLET | Freq: Two times a day (BID) | ORAL | Status: DC
  Administered 2020-04-08 – 2020-04-11 (×7): 5 mg via ORAL
  Filled 2020-04-08 (×7): qty 1

## 2020-04-08 MED ORDER — TRAZODONE HCL 50 MG PO TABS
25.0000 mg | ORAL_TABLET | Freq: Every evening | ORAL | Status: DC | PRN
  Administered 2020-04-09: 25 mg via ORAL
  Filled 2020-04-08 (×2): qty 1

## 2020-04-08 MED ORDER — ONDANSETRON HCL 4 MG PO TABS: 4.0000 mg | ORAL_TABLET | Freq: Four times a day (QID) | ORAL | Status: DC | PRN

## 2020-04-08 MED ORDER — SODIUM CHLORIDE 0.9 % IV SOLN: INTRAVENOUS | Status: DC

## 2020-04-08 MED ORDER — NITROGLYCERIN 0.4 MG SL SUBL: 0.4000 mg | SUBLINGUAL_TABLET | SUBLINGUAL | Status: DC | PRN

## 2020-04-08 MED ORDER — ACETAMINOPHEN 325 MG PO TABS: 650.0000 mg | ORAL_TABLET | Freq: Four times a day (QID) | ORAL | Status: DC | PRN

## 2020-04-08 NOTE — Evaluation (Signed)
Physical Therapy Evaluation Patient Details Name: Devin [name] [facility]. MRN: [id number] DOB: January 15, 1946 Today's Date: 04/08/2020   History of Present Illness  Per MD notes: Pt is a 75 y.o. caucasian male with a PMH that includes: A-fib, CAD, CHF, DM, HTN, MI, and bilateral TKA. Pt presented to emergency room with acute onset of altered mental status with confusion. He had a couple falls with no injuries.  MD assessment includes: Generalized weakness and failure to thrive, inability to ambulate, acute gastroenteritis with elevated lactic acid, dyslipidemia, HTN, and depression.    Clinical Impression  Pt was pleasant and motivated to participate during the session. Pt required physical assistance with all functional tasks this session and was only able to take a few very small steps at the EOB before fatiguing and having to return to sitting.  Pt struggled even to maintain static upright sitting initially.  Pt's sitting balance did improve as the session progressed but he presented with significant deficits in functional strength compared to his stated baseline and is at a very high risk for falls. Pt will benefit from PT services in a SNF setting upon discharge to safely address deficits listed in patient problem list for decreased caregiver assistance and eventual return to PLOF.       Follow Up Recommendations SNF;Supervision for mobility/OOB    Equipment Recommendations  Rolling walker with 5" wheels;3in1 (PT)    Recommendations for Other Services       Precautions / Restrictions Precautions Precautions: Fall Restrictions Weight Bearing Restrictions: No      Mobility  Bed Mobility Overal bed mobility: Needs Assistance Bed Mobility: Supine to Sit;Sit to Supine     Supine to sit: Min assist Sit to supine: Min assist   General bed mobility comments: Min A for trunk control and for static sitting balance initially once in sitting at the EOB    Transfers Overall transfer  level: Needs assistance Equipment used: Rolling walker (2 wheeled) Transfers: Sit to/from Stand Sit to Stand: Min assist;From elevated surface         General transfer comment: Min instability upon initial stand but pt able to self-correct  Ambulation/Gait Ambulation/Gait assistance: Min guard Gait Distance (Feet): 2 Feet Assistive device: Rolling walker (2 wheeled) Gait Pattern/deviations: Step-to pattern;Decreased step length - right;Decreased step length - left Gait velocity: decreased   General Gait Details: Pt only able to take several steps near the EOB before fatiguing and returning to sitting  Stairs            Wheelchair Mobility    Modified Rankin (Stroke Patients Only)       Balance Overall balance assessment: Needs assistance;History of Falls   Sitting balance-Leahy Scale: Poor Sitting balance - Comments: Pt required min A for static sitting balance during the session although sitting balance did improve to SBA as the session progressed     Standing balance-Leahy Scale: Poor Standing balance comment: Min A for stability ahd mod lean on the RW for support in standing                             Pertinent Vitals/Pain Pain Assessment: No/denies pain    Home Living Family/patient expects to be discharged to:: Private residence Living Arrangements: Alone Available Help at Discharge: Friend(s);Available PRN/intermittently Type of Home: House Home Access: Stairs to enter Entrance Stairs-Rails: None Entrance Stairs-Number of Steps: 1 small step Home Layout: One level Home Equipment: Walker - 4 wheels;Cane -  quad      Prior Function Level of Independence: Independent with assistive device(s)         Comments: Mod Ind amb with a rollator for the last several months, QC prior to that, Ind with ADLs, 4 falls in the last 6 months resulting from LOB, tripping, and legs buckling     Hand Dominance        Extremity/Trunk Assessment    Upper Extremity Assessment Upper Extremity Assessment: Generalized weakness    Lower Extremity Assessment Lower Extremity Assessment: Generalized weakness       Communication   Communication: No difficulties  Cognition Arousal/Alertness: Awake/alert Behavior During Therapy: WFL for tasks assessed/performed Overall Cognitive Status: No family/caregiver present to determine baseline cognitive functioning                                 General Comments: Pt A&O to self and situation but had some difficulty with location and time; able to follow commands well throughout      General Comments      Exercises Total Joint Exercises Ankle Circles/Pumps: Strengthening;Both;5 reps;10 reps Quad Sets: Strengthening;Both;10 reps Gluteal Sets: Strengthening;Both;10 reps Heel Slides: Strengthening;Both;5 reps Hip ABduction/ADduction: Strengthening;Both;10 reps Straight Leg Raises: Strengthening;Both;10 reps Long Arc Quad: Strengthening;Both;10 reps Knee Flexion: Strengthening;Both;10 reps Marching in Standing: Strengthening;Both;5 reps;Standing Other Exercises Other Exercises: HEP education for BLE APs, QS, and GS x 10 each every 1-2 hours daily and BLE hip abd/add x 10 2-3x/day   Assessment/Plan    PT Assessment Patient needs continued PT services  PT Problem List Decreased strength;Decreased activity tolerance;Decreased balance;Decreased mobility;Decreased knowledge of use of DME       PT Treatment Interventions DME instruction;Gait training;Stair training;Functional mobility training;Therapeutic activities;Therapeutic exercise;Balance training;Patient/family education    PT Goals (Current goals can be found in the Care Plan section)  Acute Rehab PT Goals Patient Stated Goal: Improved strength, balance, and improved walking PT Goal Formulation: With patient Time For Goal Achievement: 04/21/20 Potential to Achieve Goals: Good    Frequency Min 2X/week   Barriers  to discharge Decreased caregiver support;Inaccessible home environment      Co-evaluation               AM-PAC PT "6 Clicks" Mobility  Outcome Measure Help needed turning from your back to your side while in a flat bed without using bedrails?: A Little Help needed moving from lying on your back to sitting on the side of a flat bed without using bedrails?: A Little   Help needed standing up from a chair using your arms (e.g., wheelchair or bedside chair)?: A Little Help needed to walk in hospital room?: A Lot Help needed climbing 3-5 steps with a railing? : Total 6 Click Score: 12    End of Session Equipment Utilized During Treatment: Gait belt Activity Tolerance: Patient tolerated treatment well Patient left: in bed;with call bell/phone within reach;Other (comment) (with bilateral rails up as found in ED) Nurse Communication: Mobility status;Other (comment) (Need bed alarm turned on) PT Visit Diagnosis: Unsteadiness on feet (R26.81);History of falling (Z91.81);Difficulty in walking, not elsewhere classified (R26.2);Muscle weakness (generalized) (M62.81)    Time: 1620-1650 PT Time Calculation (min) (ACUTE ONLY): 30 min   Charges:   PT Evaluation $PT Eval Moderate Complexity: 1 Mod PT Treatments $Therapeutic Exercise: 8-22 mins        D. Elly Modena PT, DPT 04/08/20, 5:14 PM

## 2020-04-08 NOTE — ED Notes (Signed)
Room darkened for patient's comfort. 

## 2020-04-08 NOTE — ED Notes (Signed)
Patient c/o occasional nausea, but hasn't had any emesis since this morning.

## 2020-04-08 NOTE — ED Notes (Signed)
Bed alarm remains on. Patient was found at end of the stretcher, stating he needed to urinate. Patient was reminded that he had an external male catheter on which was hooked up to the wall suction. Patient was repositioned on stretcher and new external male catheter was placed. Amber colored urine noted in cannister. Patient took a few sips of diet ginger ale without feeling nauseous, but declined his lunch tray at this time.

## 2020-04-08 NOTE — ED Notes (Signed)
Patient denies being nauseous at this time. Patient requested a diet ginger ale. Dr. Blake Divine aware.

## 2020-04-08 NOTE — Progress Notes (Signed)
Pt admitted by Dr Arville Care earlier this am.  75 y.o. Caucasian male with a known history of medical problems that are mentioned below, presented to emergency room with confusion, . Initial CT head and cervical spine is negative for acute pathology. He is currently alert and oriented and able to move all extremities.  He was also found to be nauseated and vomitedand one episode of diarrhea.  Symptomatic management with IV fluids and anti emetics.    Kathlen Mody, MD

## 2020-04-08 NOTE — ED Notes (Signed)
Pt's nurse called me to clean up pt as he had puked all over himself, monitor wiring, bed, floor, bedside table. I wiped down all wire, table, floor with bleach wipes. Cleaned up Pt. Got him clean bedding and gown. Had to replace external catheter as his was no longer working as well as his BP cuff because it was covered in puke. I left pt in an upright position to help avoid this happening again. Gave him two emisis bags and placed call be within reach on bed hand rail.

## 2020-04-08 NOTE — ED Notes (Signed)
Pt still vomiting at this time, to early for PRN Zofran order, attending MD messaged to see if another dose can be given.

## 2020-04-08 NOTE — ED Notes (Signed)
Patient ate approximately 1/4 of meat on tray, denies any nausea, but refused the rest of the dinner.

## 2020-04-08 NOTE — ED Notes (Signed)
Pharmacist Darl Pikes called about morning meds and stated that if patient can take Eliquis, then the others can be held.

## 2020-04-08 NOTE — ED Notes (Signed)
Pt resting comfortably at this time. Pt states when he attempted to use the urinal it spilled onto his bed and gown. Pt cleaned up, bed changed, new chux placed and external male cathter placed to help with pt comfort. Pt denies any further needs at this time. NAD noted. Pt given TV remote  Per request. IVF at this time. Call bell in reach.

## 2020-04-08 NOTE — ED Notes (Signed)
Pt still vomiting at this time, MD aware, see MAR for antimetic admin

## 2020-04-08 NOTE — H&P (Addendum)
[place]   PATIENT NAME: Devin [name]    MR#:  [id number]  DATE OF BIRTH:  1945/06/01  DATE OF ADMISSION:  04/07/2020  PRIMARY CARE PHYSICIAN: Dione Housekeeper, MD   REQUESTING/REFERRING PHYSICIAN: Ward, Layla Maw, DO  CHIEF COMPLAINT:   Chief Complaint  Patient presents with  . Fall  . Altered Mental Status    HISTORY OF PRESENT ILLNESS:  Devin [name]  is a 75 y.o. Caucasian male with a known history of medical problems that are mentioned below, presented to emergency room with acute onset of altered mental status with confusion for the last couple of days. He had a couple falls with no injuries. He stated that he would lose balance but denied any presyncope or syncope, paresthesias or focal muscle weakness. He admitted to nausea and vomiting with more than 3 loose and watery bowel movements since yesterday. He uses a walker with ambulation today was not able to even stand. He has been feeling generally weak. No chest pain or palpitations. No dysuria, oliguria or hematuria or flank pain. At one point he was slightly agitated in the ER but was calm during my interview.  Upon presentation to the ER vital signs were within normal. Labs revealed negative COVID-19 PCRand CMP was unremarkable except total protein 5.9 and albumin 3.1. CBC was within normal. UA came back negative. Blood cultures were drawn. Lactic acid was 2.9 and later 2.5. Chest X-ray showed no acute cardiopulmonary disease. EKG showed sinus rhythm with rate of 74 with T wave inversion in V1 and V2. Noncontrasted head CT scan and C-spine CT revealed the following: No acute intracranial injury.  No calvarial fracture.  No acute fracture or listhesis of the cervical spine.  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  The patient was given 1 L bolus of IV  lactated Ringer. He will be admitted to a medical monitored bed for further evaluation and management.  PAST MEDICAL HISTORY:   Past Medical History:  Diagnosis Date  . Arrhythmia    atrial fibrillation  . CAD (coronary artery disease)   . CHF (congestive heart failure) (HCC)   . Diabetes mellitus without complication (HCC)   . Hypertension   . MI, old   . Sleep apnea     PAST SURGICAL HISTORY:   Past Surgical History:  Procedure Laterality Date  . APPENDECTOMY    . BREAST SURGERY     benign mass  . CARDIOVERSION Right 12/15/2019   Procedure: CARDIOVERSION;  Surgeon: Laurier Nancy, MD;  Location: [facility] ORS;  Service: Cardiovascular;  Laterality: Right;  . CORONARY ANGIOPLASTY WITH STENT PLACEMENT    . LEFT HEART CATH AND CORS/GRAFTS ANGIOGRAPHY N/A 05/21/2019   Procedure: LEFT HEART CATH AND CORONARY ANGIOGRAPHY;  Surgeon: Laurier Nancy, MD;  Location: [facility] INVASIVE CV LAB;  Service: Cardiovascular;  Laterality: N/A;  . REPLACEMENT TOTAL KNEE BILATERAL      SOCIAL HISTORY:   Social History   Tobacco Use  . Smoking status: Former Games developer  . Smokeless tobacco: Never Used  . Tobacco comment: Quit over 40 years ago  Substance Use Topics  . Alcohol use: Not Currently    Comment: social    FAMILY HISTORY:   Family History  Problem Relation Age of Onset  . Osteoarthritis Mother   . Other Father 77       MVA  DRUG ALLERGIES:   Allergies  Allergen Reactions  . Ace Inhibitors Swelling  . Morphine Swelling  . 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    REVIEW OF SYSTEMS:   As per history of present illness. All pertinent systems were reviewed above. Constitutional, HEENT, cardiovascular, respiratory, GI, GU, musculoskeletal, neuro, psychiatric, endocrine, integumentary and hematologic systems were reviewed and are otherwise negative/unremarkable except for positive findings mentioned above in the HPI.   MEDICATIONS AT HOME:   Prior to Admission  medications   Medication Sig Start Date End Date Taking? Authorizing Provider  amiodarone (PACERONE) 400 MG tablet Take 1 tablet (400 mg total) by mouth daily. Patient taking differently: Take 200 mg by mouth daily. 12/16/19  Yes Arnetha Courser, MD  apixaban (ELIQUIS) 5 MG TABS tablet Take 5 mg by mouth 2 (two) times daily.    Yes [provider]  atorvastatin (LIPITOR) 80 MG tablet Take 1 tablet (80 mg total) by mouth daily. 05/16/19 05/15/20 Yes Agbata, Tochukwu, MD  buPROPion (WELLBUTRIN XL) 300 MG 24 hr tablet Take 300 mg by mouth daily.    Yes [provider]  carvedilol (COREG) 25 MG tablet Take 1 tablet (25 mg total) by mouth 2 (two) times daily. 05/16/19 05/15/20 Yes Agbata, Tochukwu, MD  cyclobenzaprine (FLEXERIL) 10 MG tablet Take 1 tablet by mouth 2 (two) times daily as needed for muscle spasms. 02/09/20 02/08/21 Yes [provider]  ENTRESTO 49-51 MG Take 1 tablet by mouth 2 (two) times daily. 11/09/19  Yes [provider]  EPINEPHrine 0.3 mg/0.3 mL IJ SOAJ injection Inject 0.3 mg into the muscle as needed for anaphylaxis.   Yes [provider]  ezetimibe (ZETIA) 10 MG tablet Take 10 mg by mouth daily. 04/06/19  Yes [provider]  furosemide (LASIX) 20 MG tablet Take 1 tablet (20 mg total) by mouth daily. 12/15/19 12/14/20 Yes Arnetha Courser, MD  gabapentin (NEURONTIN) 300 MG capsule Take 300 mg by mouth 2 (two) times daily. 05/01/19  Yes [provider]  metFORMIN (GLUCOPHAGE) 500 MG tablet Take 500 mg by mouth 2 (two) times daily. 02/17/19  Yes [provider]  nitroGLYCERIN (NITROSTAT) 0.4 MG SL tablet Place 0.4 mg under the tongue every 5 (five) minutes as needed for chest pain.   Yes [provider]  sertraline (ZOLOFT) 25 MG tablet Take 25 mg by mouth daily. 11/27/19  Yes [provider]  docusate sodium (COLACE) 100 MG capsule Take 100 mg by mouth daily. 04/01/20   [provider]  sildenafil  (VIAGRA) 50 MG tablet Take 50 mg by mouth daily as needed for erectile dysfunction. Patient not taking: Reported on 04/08/2020    [provider]  benazepril (LOTENSIN) 20 MG tablet Take 20 mg by mouth daily. 03/19/19 08/13/19  [provider]      VITAL SIGNS:  Blood pressure 118/76, pulse 80, temperature 97.9 F (36.6 C), temperature source Oral, resp. rate 14, SpO2 91 %.  PHYSICAL EXAMINATION:  Physical Exam  GENERAL:  75 y.o.-year-old Caucasian male patient lying in the bed with no acute distress.  EYES: Pupils equal, round, reactive to light and accommodation. No scleral icterus. Extraocular muscles intact.  HEENT: Head atraumatic, normocephalic. Oropharynx with slightly dry mucous membrane and tongue and nasopharynx clear.  NECK:  Supple, no jugular venous distention. No thyroid enlargement, no tenderness.  LUNGS: Normal breath sounds bilaterally, no wheezing, rales,rhonchi or crepitation. No use of accessory muscles of respiration.  CARDIOVASCULAR: Regular rate and rhythm, S1, S2  normal. No murmurs, rubs, or gallops.  ABDOMEN: Soft, nondistended, nontender. Bowel sounds present. No organomegaly or mass.  EXTREMITIES: No pedal edema, cyanosis, or clubbing.  NEUROLOGIC: Cranial nerves II through XII are intact. Muscle strength 5/5 in all extremities. Sensation intact. Gait not checked.  PSYCHIATRIC: The patient is alert and oriented x 3.  Normal affect and good eye contact. SKIN: No obvious rash, lesion, or ulcer.   LABORATORY PANEL:   CBC Recent Labs  Lab 04/07/20 2201  WBC 6.3  HGB 13.5  HCT 40.7  PLT 241   ------------------------------------------------------------------------------------------------------------------  Chemistries  Recent Labs  Lab 04/07/20 2201 04/08/20 0500  NA 139 136  K 3.8 4.0  CL 105 99  CO2 21* 24  GLUCOSE 122* 136*  BUN 27* 28*  CREATININE 0.88 0.93  CALCIUM 8.0* 9.1  AST 28  --   ALT 25  --   ALKPHOS 51  --    BILITOT 1.5*  --    ------------------------------------------------------------------------------------------------------------------  Cardiac Enzymes No results for input(s): TROPONINI in the last 168 hours. ------------------------------------------------------------------------------------------------------------------  RADIOLOGY:  DG Chest 2 View  Result Date: 04/07/2020 CLINICAL DATA:  Weakness EXAM: CHEST - 2 VIEW COMPARISON:  12/13/2019 FINDINGS: Post sternotomy changes. Right-sided stimulator device with ascending lead. No focal opacity or pleural effusion. Stable cardiomediastinal silhouette with aortic atherosclerosis. No pneumothorax IMPRESSION: No active cardiopulmonary disease. Electronically Signed   By: Jasmine PangKim  Fujinaga M.D.   On: 04/07/2020 22:23   CT Head Wo Contrast  Result Date: 04/07/2020 CLINICAL DATA:  Fall, altered mental status, neck injury EXAM: CT HEAD WITHOUT CONTRAST CT CERVICAL SPINE WITHOUT CONTRAST TECHNIQUE: Multidetector CT imaging of the head and cervical spine was performed following the standard protocol without intravenous contrast. Multiplanar CT image reconstructions of the cervical spine were also generated. COMPARISON:  None. FINDINGS: CT HEAD FINDINGS Brain: Normal anatomic configuration. Parenchymal volume loss is commensurate with the patient's age. Mild periventricular white matter changes are present likely reflecting the sequela of small vessel ischemia. No abnormal intra or extra-axial mass lesion or fluid collection. No abnormal mass effect or midline shift. No evidence of acute intracranial hemorrhage or infarct. Ventricular size is normal. Cerebellum unremarkable. Vascular: No asymmetric hyperdense vasculature at the skull base. Skull: Intact Sinuses/Orbits: Paranasal sinuses are clear. Orbits are unremarkable. Other: Mastoid air cells and middle ear cavities are clear. CT CERVICAL SPINE FINDINGS Alignment: Normal cervical lordosis.  No listhesis.  Skull base and vertebrae: The craniocervical junction is unremarkable. Atlantal dental interval is normal. No acute fracture of the cervical spine. Soft tissues and spinal canal: No prevertebral fluid or swelling. No visible canal hematoma. Posterior disc osteophyte complex at C4-5 effaces the anterior canal space and abuts the thecal sac without remodeling. Posterior disc osteophyte complex at C5-6, asymmetrically more severe on the left, mildly narrows the central canal and abuts the thecal sac without remodeling. Broad-based posterior disc osteophyte complex at C6-7 abuts and mildly flattens the thecal sac in keeping with moderate central canal stenosis. The AP diameter of the spinal canal at this level is 6-7 mm. Disc levels: Review of the sagittal reformats demonstrates moderate atlantodental degenerate arthritis. There is intervertebral disc space narrowing and endplate remodeling throughout the cervical spine, most severe at C4-C7 with prominent anterior disc osteophytes noted, in keeping with mild to moderate degenerative disc disease. Vertebral body height has been preserved. Review of the a axial images demonstrates multilevel uncovertebral and facet arthrosis resulting in moderate right neural foraminal narrowing at C2-3, moderate to severe  right and moderate left neural foraminal narrowing at C3-4, moderate severe right neural foraminal narrowing at C4-5, moderate left neural foraminal narrowing at C5-6, and moderate bilateral neural foraminal narrowing at C6-7. Upper chest: Unremarkable Other: Remote left first and second rib fractures are noted. IMPRESSION: No acute intracranial injury.  No calvarial fracture. No acute fracture or listhesis of the cervical spine. 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 Electronically Signed   By: Helyn Numbers MD    On: 04/07/2020 22:57   CT Cervical Spine Wo Contrast  Result Date: 04/07/2020 CLINICAL DATA:  Fall, altered mental status, neck injury EXAM: CT HEAD WITHOUT CONTRAST CT CERVICAL SPINE WITHOUT CONTRAST TECHNIQUE: Multidetector CT imaging of the head and cervical spine was performed following the standard protocol without intravenous contrast. Multiplanar CT image reconstructions of the cervical spine were also generated. COMPARISON:  None. FINDINGS: CT HEAD FINDINGS Brain: Normal anatomic configuration. Parenchymal volume loss is commensurate with the patient's age. Mild periventricular white matter changes are present likely reflecting the sequela of small vessel ischemia. No abnormal intra or extra-axial mass lesion or fluid collection. No abnormal mass effect or midline shift. No evidence of acute intracranial hemorrhage or infarct. Ventricular size is normal. Cerebellum unremarkable. Vascular: No asymmetric hyperdense vasculature at the skull base. Skull: Intact Sinuses/Orbits: Paranasal sinuses are clear. Orbits are unremarkable. Other: Mastoid air cells and middle ear cavities are clear. CT CERVICAL SPINE FINDINGS Alignment: Normal cervical lordosis.  No listhesis. Skull base and vertebrae: The craniocervical junction is unremarkable. Atlantal dental interval is normal. No acute fracture of the cervical spine. Soft tissues and spinal canal: No prevertebral fluid or swelling. No visible canal hematoma. Posterior disc osteophyte complex at C4-5 effaces the anterior canal space and abuts the thecal sac without remodeling. Posterior disc osteophyte complex at C5-6, asymmetrically more severe on the left, mildly narrows the central canal and abuts the thecal sac without remodeling. Broad-based posterior disc osteophyte complex at C6-7 abuts and mildly flattens the thecal sac in keeping with moderate central canal stenosis. The AP diameter of the spinal canal at this level is 6-7 mm. Disc levels: Review of the  sagittal reformats demonstrates moderate atlantodental degenerate arthritis. There is intervertebral disc space narrowing and endplate remodeling throughout the cervical spine, most severe at C4-C7 with prominent anterior disc osteophytes noted, in keeping with mild to moderate degenerative disc disease. Vertebral body height has been preserved. Review of the a axial images demonstrates multilevel uncovertebral and facet arthrosis resulting in moderate right neural foraminal narrowing at C2-3, moderate to severe right and moderate left neural foraminal narrowing at C3-4, moderate severe right neural foraminal narrowing at C4-5, moderate left neural foraminal narrowing at C5-6, and moderate bilateral neural foraminal narrowing at C6-7. Upper chest: Unremarkable Other: Remote left first and second rib fractures are noted. IMPRESSION: No acute intracranial injury.  No calvarial fracture. No acute fracture or listhesis of the cervical spine. 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 Electronically Signed   By: Helyn Numbers MD   On: 04/07/2020 22:57      IMPRESSION AND PLAN:   1. Generalized weakness and failure to thrive with inability to ambulate and to recent falls. -The patient was admitted to a medical monitored bed. -Physical therapy consult to be obtained. -He'll  be hydrated with IV normal saline especially given recent nausea and vomiting with diarrhea..  2. Acute gastroenteritis with elevated lactic acid. -This is likely viral though but given recurrent diarrhea with more than 3 bowel movements and elevated lactic acid will obtain C. difficile and stool studies.  3. Altered mental status with confusion, likely metabolic cephalopathy secondary to above. -We'll follow neuro checks every 4 hours for 24 hours.  4. Dyslipidemia. -We'll continue  statin therapy and Zetia.  5. Depression. -We'll continue Wellbutrin XL and Zoloft.  6. Hypertension. -We'll continue Coreg.  7. Type 2 diabetes mellitus with peripheral neuropathy. -We'll hold off Metformin and place on supplemental coverage with NovoLog. -Continue Neurontin  8. GERD. -We'll continue PPI therapy.  9. DVT prophylaxis. The subtenons Lovenox.   All the records are reviewed and case discussed with ED provider. The plan of care was discussed in details with the patient (and family). I answered all questions. The patient agreed to proceed with the above mentioned plan. Further management will depend upon hospital course.   CODE STATUS: Full code  Status is: Inpatient  Remains inpatient appropriate because:Altered mental status, Ongoing diagnostic testing needed not appropriate for outpatient work up, Unsafe d/c plan, IV treatments appropriate due to intensity of illness or inability to take PO and Inpatient level of care appropriate due to severity of illness   Dispo: The patient is from: Home              Anticipated d/c is to: SNF              Anticipated d/c date is: 3 days              Patient currently is not medically stable to d/c.   Difficult to place patient No   TOTAL TIME TAKING CARE OF THIS PATIENT: 55 minutes.    Hannah Beat M.D on 04/08/2020 at 6:49 AM  [facility]   From 7 PM-7 AM, contact night-coverage [email or web address]  CC: Primary care physician; Dione Housekeeper, MD

## 2020-04-08 NOTE — ED Notes (Signed)
Patient found at end of stretcher when bed alarm went off. Patient refused to use urinal that was at bedside and took off male external catheter. Linens were changed. Patient's gown was changed and a new external catheter was placed. Patient agreed with it's use. Patient is occasionally confused and forgetful. Patient thought he was at home and then at [facility]. Patient is easily redirected.

## 2020-04-08 NOTE — Progress Notes (Signed)
PT Cancellation Note  Patient Details Name: Suraj Ramdass [facility]. MRN: [id number] DOB: [birth year]   Cancelled Treatment:    Reason Eval/Treat Not Completed: Other (comment); PT evaluation held this date per nursing request secondary to pt with active nausea and vomiting.  Will attempt to see pt at a future date/time as medically appropriate.     Ovidio Hanger PT, DPT 04/08/20, 9:26 AM

## 2020-04-09 ENCOUNTER — Encounter: Payer: Self-pay | Admitting: Family Medicine

## 2020-04-09 DIAGNOSIS — R531 Weakness: Secondary | ICD-10-CM

## 2020-04-09 DIAGNOSIS — R410 Disorientation, unspecified: Secondary | ICD-10-CM

## 2020-04-09 DIAGNOSIS — Y92009 Unspecified place in unspecified non-institutional (private) residence as the place of occurrence of the external cause: Secondary | ICD-10-CM

## 2020-04-09 DIAGNOSIS — W19XXXD Unspecified fall, subsequent encounter: Secondary | ICD-10-CM

## 2020-04-09 DIAGNOSIS — K529 Noninfective gastroenteritis and colitis, unspecified: Secondary | ICD-10-CM | POA: Diagnosis present

## 2020-04-09 DIAGNOSIS — W19XXXA Unspecified fall, initial encounter: Secondary | ICD-10-CM

## 2020-04-09 LAB — CBC
HCT: 38.2 % — ABNORMAL LOW (ref 39.0–52.0)
Hemoglobin: 12.9 g/dL — ABNORMAL LOW (ref 13.0–17.0)
MCH: 31.1 pg (ref 26.0–34.0)
MCHC: 33.8 g/dL (ref 30.0–36.0)
MCV: 92 fL (ref 80.0–100.0)
Platelets: 232 10*3/uL (ref 150–400)
RBC: 4.15 MIL/uL — ABNORMAL LOW (ref 4.22–5.81)
RDW: 14.2 % (ref 11.5–15.5)
WBC: 6.5 10*3/uL (ref 4.0–10.5)
nRBC: 0 % (ref 0.0–0.2)

## 2020-04-09 LAB — GLUCOSE, CAPILLARY
Glucose-Capillary: 91 mg/dL (ref 70–99)
Glucose-Capillary: 82 mg/dL (ref 70–99)
Glucose-Capillary: 88 mg/dL (ref 70–99)
Glucose-Capillary: 83 mg/dL (ref 70–99)

## 2020-04-09 LAB — BASIC METABOLIC PANEL
Anion gap: 11 (ref 5–15)
BUN: 25 mg/dL — ABNORMAL HIGH (ref 8–23)
CO2: 21 mmol/L — ABNORMAL LOW (ref 22–32)
Calcium: 8.1 mg/dL — ABNORMAL LOW (ref 8.9–10.3)
Chloride: 104 mmol/L (ref 98–111)
Creatinine, Ser: 0.83 mg/dL (ref 0.61–1.24)
GFR, Estimated: >60 mL/min (ref >60)
Glucose, Bld: 92 mg/dL (ref 70–99)
Potassium: 3.8 mmol/L (ref 3.5–5.1)
Sodium: 136 mmol/L (ref 135–145)

## 2020-04-09 LAB — VITAMIN B12: Vitamin B-12: 268 pg/mL (ref 180–914)

## 2020-04-09 LAB — HEMOGLOBIN A1C
Hgb A1c MFr Bld: 5.8 % — ABNORMAL HIGH (ref 4.8–5.6)
Mean Plasma Glucose: 119.76 mg/dL

## 2020-04-09 LAB — TSH: TSH: 1.196 u[IU]/mL (ref 0.350–4.500)

## 2020-04-09 MED ORDER — GUAIFENESIN-DM 100-10 MG/5ML PO SYRP: 5.0000 mL | ORAL_SOLUTION | ORAL | Status: DC | PRN

## 2020-04-09 MED ORDER — PANTOPRAZOLE SODIUM 40 MG PO TBEC
40.0000 mg | DELAYED_RELEASE_TABLET | Freq: Every day | ORAL | Status: AC
  Administered 2020-04-09 – 2020-04-10 (×2): 40 mg via ORAL
  Filled 2020-04-09 (×2): qty 1

## 2020-04-09 NOTE — Progress Notes (Addendum)
Progress Note    [name].  BTD:[id number] DOB: Oct 30, 1945  DOA: 04/07/2020 PCP: Dione Housekeeper, MD      Brief Narrative:    Medical records reviewed and are as summarized below:  Devin [name]. is a 75 y.o. 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  He also complained of nausea, vomiting and diarrhea.  He was admitted to the hospital for gastroenteritis, dehydration and generalized weakness leading to falls at home.  He was treated with IV fluids.    Assessment/Plan:   Principal Problem:   Fall at home Active Problems:   AMS (altered mental status)   Generalized weakness   Gastroenteritis    Body mass index is 27.53 kg/m.  Gastroenteritis with lactic acidosis and dehydration: Improved.  Acute metabolic encephalopathy likely due to dehydration: Improved.  Continue supportive care  Generalized weakness and falls: PT and OT recommend discharge to SNF.  Continue PT and OT.  CAD, paroxysmal atrial fibrillation: Continue amiodarone, carvedilol and Eliquis.  Chronic systolic and diastolic CHF: Hold Lasix.  Continue carvedilol and Entresto. 2D echo in October 2021 showed EF estimated at 40 to 45%, grade 1 diastolic dysfunction.  Type II DM with peripheral neuropathy: Metformin on hold.  Continue Neurontin.  Other comorbidities include dyslipidemia, depression, hypertension and GERD   Diet Order            Diet Carb Modified Fluid consistency: Thin; Room service appropriate? Yes  Diet effective now                    Consultants:  None  Procedures:  None    Medications:   . amiodarone  200 mg Oral Daily  . apixaban  5 mg Oral BID  . aspirin EC  81 mg Oral Daily  . atorvastatin  80 mg Oral Daily  .  buPROPion  300 mg Oral Daily  . carvedilol  25 mg Oral BID  . docusate sodium  100 mg Oral Daily  . ezetimibe  10 mg Oral Daily  . gabapentin  300 mg Oral BID  . insulin aspart  0-9 Units Subcutaneous TID PC & HS  . sacubitril-valsartan  1 tablet Oral BID  . sertraline  25 mg Oral Daily   Continuous Infusions:    Anti-infectives (From admission, onward)   None             Family Communication/Anticipated D/C date and plan/Code Status   DVT prophylaxis:  apixaban (ELIQUIS) tablet 5 mg     Code Status: Full Code  Family Communication: None Disposition Plan:    Status is: Inpatient  Remains inpatient appropriate because:Unsafe d/c plan and Inpatient level of care appropriate due to severity of illness   Dispo: The patient is from: Home              Anticipated d/c is to: SNF              Anticipated d/c date is: 2 days              Patient currently is not medically stable to d/c.   Difficult to place patient No           Subjective:   C/o generalized weakness and weakness in the legs.  Objective:  Vitals:   04/08/20 2041 04/09/20 0419 04/09/20 0801 04/09/20 0920  BP: 129/80 (!) 121/58 124/70   Pulse: 85 83 80   Resp: 18 19 17    Temp: 97.7 F (36.5 C) 98.5 F (36.9 C) 97.9 F (36.6 C)   TempSrc:      SpO2: 97% 94% 97%   Weight:    92.1 kg   No data found.   Intake/Output Summary (Last 24 hours) at 04/09/2020 1231 Last data filed at 04/09/2020 0413 Gross per 24 hour  Intake 2804.78 ml  Output 900 ml  Net 1904.78 ml   Filed Weights   04/09/20 0920  Weight: 92.1 kg    Exam:  GEN: NAD SKIN: No rash EYES: EOMI ENT: MMM CV: RRR PULM: CTA B ABD: soft, ND, NT, +BS CNS: AAO x 3, non focal EXT: No edema or tenderness   Data Reviewed:   I have personally reviewed following labs and imaging studies:  Labs: Labs show the following:   Basic Metabolic Panel: Recent Labs  Lab 04/07/20 2201 04/08/20 0500 04/09/20 0603   NA 139 136 136  K 3.8 4.0 3.8  CL 105 99 104  CO2 21* 24 21*  GLUCOSE 122* 136* 92  BUN 27* 28* 25*  CREATININE 0.88 0.93 0.83  CALCIUM 8.0* 9.1 8.1*   GFR Estimated Creatinine Clearance: 83.1 mL/min (by C-G formula based on SCr of 0.83 mg/dL). Liver Function Tests: Recent Labs  Lab 04/07/20 2201  AST 28  ALT 25  ALKPHOS 51  BILITOT 1.5*  PROT 5.9*  ALBUMIN 3.1*   No results for input(s): LIPASE, AMYLASE in the last 168 hours. No results for input(s): AMMONIA in the last 168 hours. Coagulation profile No results for input(s): INR, PROTIME in the last 168 hours.  CBC: Recent Labs  Lab 04/07/20 2201 04/08/20 0500 04/09/20 0603  WBC 6.3 6.4 6.5  NEUTROABS 4.8  --   --   HGB 13.5 13.7 12.9*  HCT 40.7 40.6 38.2*  MCV 93.1 90.6 92.0  PLT 241 256 232   Cardiac Enzymes: No results for input(s): CKTOTAL, CKMB, CKMBINDEX, TROPONINI in the last 168 hours. BNP (last 3 results) No results for input(s): PROBNP in the last 8760 hours. CBG: Recent Labs  Lab 04/08/20 1341 04/08/20 2151 04/09/20 0758 04/09/20 1127  GLUCAP 85 89 91 82   D-Dimer: No results for input(s): DDIMER in the last 72 hours. Hgb A1c: Recent Labs    04/08/20 1744  HGBA1C 5.8*   Lipid Profile: No results for input(s): CHOL, HDL, LDLCALC, TRIG, CHOLHDL, LDLDIRECT in the last 72 hours. Thyroid function studies: Recent Labs    04/09/20 1004  TSH 1.196   Anemia work up: No results for input(s): VITAMINB12, FOLATE, FERRITIN, TIBC, IRON, RETICCTPCT in the last 72 hours. Sepsis Labs: Recent Labs  Lab 04/07/20 2201 04/08/20 0111 04/08/20 0500 04/08/20 1744 04/09/20 0603  WBC 6.3  --  6.4  --  6.5  LATICACIDVEN 2.9* 2.5*  --  1.4  --     Microbiology Recent Results (from the past 240 hour(s))  SARS Coronavirus 2 by RT PCR (hospital order, performed in [facility] hospital lab) Nasopharyngeal Nasopharyngeal Swab     Status: None   Collection Time: 04/07/20 10:01 PM   Specimen:  Nasopharyngeal Swab  Result Value Ref Range Status   SARS Coronavirus 2 NEGATIVE NEGATIVE Final    Comment: (NOTE) SARS-CoV-2 target nucleic acids are NOT DETECTED.  The SARS-CoV-2 RNA is generally detectable in upper and  lower respiratory specimens during the acute phase of infection. The lowest concentration of SARS-CoV-2 viral copies this assay can detect is 250 copies / mL. A negative result does not preclude SARS-CoV-2 infection and should not be used as the sole basis for treatment or other patient management decisions.  A negative result may occur with improper specimen collection / handling, submission of specimen other than nasopharyngeal swab, presence of viral mutation(s) within the areas targeted by this assay, and inadequate number of viral copies (<250 copies / mL). A negative result must be combined with clinical observations, patient history, and epidemiological information.  Fact Sheet for Patients:   BoilerBrush.com.cy  Fact Sheet for Healthcare Providers: https://pope.com/  This test is not yet approved or  cleared by the [place] FDA and has been authorized for detection and/or diagnosis of SARS-CoV-2 by FDA under an Emergency Use Authorization (EUA).  This EUA will remain in effect (meaning this test can be used) for the duration of the COVID-19 declaration under Section 564(b)(1) of the Act, 21 U.S.C. section 360bbb-3(b)(1), unless the authorization is terminated or revoked sooner.  Performed at [facility], [address] [place], [place] [address]   Culture, blood (routine x 2)     Status: None (Preliminary result)   Collection Time: 04/07/20 10:02 PM   Specimen: Left Antecubital; Blood  Result Value Ref Range Status   Specimen Description LEFT ANTECUBITAL  Final   Special Requests   Final    BOTTLES DRAWN AEROBIC AND ANAEROBIC Blood Culture adequate volume   Culture   Final    NO GROWTH  2 DAYS Performed at [facility], [address], [place], [place] [address]    Report Status PENDING  Incomplete  Culture, blood (routine x 2)     Status: None (Preliminary result)   Collection Time: 04/07/20 10:07 PM   Specimen: BLOOD LEFT FOREARM  Result Value Ref Range Status   Specimen Description BLOOD LEFT FOREARM  Final   Special Requests   Final    BOTTLES DRAWN AEROBIC AND ANAEROBIC Blood Culture adequate volume   Culture   Final    NO GROWTH 2 DAYS Performed at [facility], [address] [place], [place] [address]    Report Status PENDING  Incomplete    Procedures and diagnostic studies:  DG Chest 2 View  Result Date: 04/07/2020 CLINICAL DATA:  Weakness EXAM: CHEST - 2 VIEW COMPARISON:  12/13/2019 FINDINGS: Post sternotomy changes. Right-sided stimulator device with ascending lead. No focal opacity or pleural effusion. Stable cardiomediastinal silhouette with aortic atherosclerosis. No pneumothorax IMPRESSION: No active cardiopulmonary disease. Electronically Signed   By: Jasmine Pang M.D.   On: 04/07/2020 22:23   CT Head Wo Contrast  Result Date: 04/07/2020 CLINICAL DATA:  Fall, altered mental status, neck injury EXAM: CT HEAD WITHOUT CONTRAST CT CERVICAL SPINE WITHOUT CONTRAST TECHNIQUE: Multidetector CT imaging of the head and cervical spine was performed following the standard protocol without intravenous contrast. Multiplanar CT image reconstructions of the cervical spine were also generated. COMPARISON:  None. FINDINGS: CT HEAD FINDINGS Brain: Normal anatomic configuration. Parenchymal volume loss is commensurate with the patient's age. Mild periventricular white matter changes are present likely reflecting the sequela of small vessel ischemia. No abnormal intra or extra-axial mass lesion or fluid collection. No abnormal mass effect or midline shift. No evidence of acute intracranial hemorrhage or infarct. Ventricular size is normal. Cerebellum  unremarkable. Vascular: No asymmetric hyperdense vasculature at the skull base. Skull: Intact Sinuses/Orbits:  Paranasal sinuses are clear. Orbits are unremarkable. Other: Mastoid air cells and middle ear cavities are clear. CT CERVICAL SPINE FINDINGS Alignment: Normal cervical lordosis.  No listhesis. Skull base and vertebrae: The craniocervical junction is unremarkable. Atlantal dental interval is normal. No acute fracture of the cervical spine. Soft tissues and spinal canal: No prevertebral fluid or swelling. No visible canal hematoma. Posterior disc osteophyte complex at C4-5 effaces the anterior canal space and abuts the thecal sac without remodeling. Posterior disc osteophyte complex at C5-6, asymmetrically more severe on the left, mildly narrows the central canal and abuts the thecal sac without remodeling. Broad-based posterior disc osteophyte complex at C6-7 abuts and mildly flattens the thecal sac in keeping with moderate central canal stenosis. The AP diameter of the spinal canal at this level is 6-7 mm. Disc levels: Review of the sagittal reformats demonstrates moderate atlantodental degenerate arthritis. There is intervertebral disc space narrowing and endplate remodeling throughout the cervical spine, most severe at C4-C7 with prominent anterior disc osteophytes noted, in keeping with mild to moderate degenerative disc disease. Vertebral body height has been preserved. Review of the a axial images demonstrates multilevel uncovertebral and facet arthrosis resulting in moderate right neural foraminal narrowing at C2-3, moderate to severe right and moderate left neural foraminal narrowing at C3-4, moderate severe right neural foraminal narrowing at C4-5, moderate left neural foraminal narrowing at C5-6, and moderate bilateral neural foraminal narrowing at C6-7. Upper chest: Unremarkable Other: Remote left first and second rib fractures are noted. IMPRESSION: No acute intracranial injury.  No calvarial  fracture. No acute fracture or listhesis of the cervical spine. 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 Electronically Signed   By: Helyn Numbers MD   On: 04/07/2020 22:57   CT Cervical Spine Wo Contrast  Result Date: 04/07/2020 CLINICAL DATA:  Fall, altered mental status, neck injury EXAM: CT HEAD WITHOUT CONTRAST CT CERVICAL SPINE WITHOUT CONTRAST TECHNIQUE: Multidetector CT imaging of the head and cervical spine was performed following the standard protocol without intravenous contrast. Multiplanar CT image reconstructions of the cervical spine were also generated. COMPARISON:  None. FINDINGS: CT HEAD FINDINGS Brain: Normal anatomic configuration. Parenchymal volume loss is commensurate with the patient's age. Mild periventricular white matter changes are present likely reflecting the sequela of small vessel ischemia. No abnormal intra or extra-axial mass lesion or fluid collection. No abnormal mass effect or midline shift. No evidence of acute intracranial hemorrhage or infarct. Ventricular size is normal. Cerebellum unremarkable. Vascular: No asymmetric hyperdense vasculature at the skull base. Skull: Intact Sinuses/Orbits: Paranasal sinuses are clear. Orbits are unremarkable. Other: Mastoid air cells and middle ear cavities are clear. CT CERVICAL SPINE FINDINGS Alignment: Normal cervical lordosis.  No listhesis. Skull base and vertebrae: The craniocervical junction is unremarkable. Atlantal dental interval is normal. No acute fracture of the cervical spine. Soft tissues and spinal canal: No prevertebral fluid or swelling. No visible canal hematoma. Posterior disc osteophyte complex at C4-5 effaces the anterior canal space and abuts the thecal sac without remodeling. Posterior disc osteophyte complex at C5-6, asymmetrically more severe on the left, mildly  narrows the central canal and abuts the thecal sac without remodeling. Broad-based posterior disc osteophyte complex at C6-7 abuts and mildly flattens the thecal sac in keeping with moderate central canal stenosis. The AP diameter of the spinal canal at this level is 6-7 mm. Disc levels: Review of the  sagittal reformats demonstrates moderate atlantodental degenerate arthritis. There is intervertebral disc space narrowing and endplate remodeling throughout the cervical spine, most severe at C4-C7 with prominent anterior disc osteophytes noted, in keeping with mild to moderate degenerative disc disease. Vertebral body height has been preserved. Review of the a axial images demonstrates multilevel uncovertebral and facet arthrosis resulting in moderate right neural foraminal narrowing at C2-3, moderate to severe right and moderate left neural foraminal narrowing at C3-4, moderate severe right neural foraminal narrowing at C4-5, moderate left neural foraminal narrowing at C5-6, and moderate bilateral neural foraminal narrowing at C6-7. Upper chest: Unremarkable Other: Remote left first and second rib fractures are noted. IMPRESSION: No acute intracranial injury.  No calvarial fracture. No acute fracture or listhesis of the cervical spine. 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 Electronically Signed   By: Helyn NumbersAshesh  Parikh MD   On: 04/07/2020 22:57               LOS: 1 day   Darcus Edds  [facility]   Pager on [email or web address]. If 7PM-7AM, please contact night-coverage at [email or web address]     04/09/2020, 12:31 PM

## 2020-04-10 DIAGNOSIS — R7989 Other specified abnormal findings of blood chemistry: Secondary | ICD-10-CM

## 2020-04-10 DIAGNOSIS — R4182 Altered mental status, unspecified: Secondary | ICD-10-CM

## 2020-04-10 LAB — GLUCOSE, CAPILLARY
Glucose-Capillary: 101 mg/dL — ABNORMAL HIGH (ref 70–99)
Glucose-Capillary: 94 mg/dL (ref 70–99)
Glucose-Capillary: 92 mg/dL (ref 70–99)

## 2020-04-10 LAB — RPR: RPR Ser Ql: NONREACTIVE

## 2020-04-10 MED ORDER — VITAMIN B-12 1000 MCG PO TABS
1000.0000 ug | ORAL_TABLET | Freq: Every day | ORAL | Status: DC
  Administered 2020-04-10 – 2020-04-11 (×2): 1000 ug via ORAL
  Filled 2020-04-10 (×2): qty 1

## 2020-04-10 MED ORDER — BISACODYL 10 MG RE SUPP
10.0000 mg | Freq: Once | RECTAL | Status: AC
  Administered 2020-04-10: 10 mg via RECTAL
  Filled 2020-04-10: qty 1

## 2020-04-10 MED ORDER — FLEET ENEMA 7-19 GM/118ML RE ENEM: 1.0000 | ENEMA | Freq: Once | RECTAL | Status: DC

## 2020-04-10 NOTE — Progress Notes (Signed)
PROGRESS NOTE    Devin [name].  JSE:[id number] DOB: 03-20-1945 DOA: 04/07/2020 PCP: Dione Housekeeper, MD   Brief Narrative: Taken from prior notes. Devin [name]. is a 75 y.o. 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  He also complained of nausea, vomiting and diarrhea.  He was admitted to the hospital for gastroenteritis, dehydration and generalized weakness leading to falls at home.  He was treated with IV fluids. Clinically improved.  PT recommending SNF but he wants to go home with home health stating that he has some support from friends and family.  Subjective: Patient continued to have mild nausea, no vomiting.  No bowel movement for the past 4 days.  Wants to have something for constipation as belly is little distended.  Assessment & Plan:   Principal Problem:   Fall at home Active Problems:   AMS (altered mental status)   Generalized weakness   Gastroenteritis  Gastritis.  Continue to have mild nausea but no vomiting.  P.o. intake still poor. Lactic acidosis resolved. -Continue with supportive care.  Acute metabolic encephalopathy.  Most likely secondary to dehydration and gastritis.  Has been resolved.  Generalized weakness and falls.  PT is recommending SNF placement but patient wants to go back home with home health services.  He lives alone but stating that he had good support from friends and family. B12 at 268 Home health services ordered. -Start him on B12 supplement  Constipation.  No bowel movement for the past 4 days.  No abdominal pain but abdomen feels little distended.  Good bowel sounds. -Give him Dulcolax suppository. -If that does not work we can try enema.  Chronic HFrEF.  Appears euvolemic.  EF of 40 to 45% and grade  1 diastolic dysfunction according to the echo done in October 2021. -Continue with home dose of carvedilol and Entresto. -Holding Lasix for concern of dehydration.  Paroxysmal atrial fibrillation. -Continue home dose of amiodarone, carvedilol and Eliquis.  Type 2 diabetes mellitus with peripheral neuropathy.  On Metformin at home. -Continue SSI -Continue with Neurontin.  Hypertension.  Blood pressure within goal. -Continue home dose of Entresto  History of depression.  No acute concern. -Continue home dose of Zoloft and trazodone   Objective: Vitals:   04/09/20 2349 04/10/20 0446 04/10/20 0732 04/10/20 1235  BP: (!) 150/77 (!) 141/79 138/80 138/90  Pulse: 80 79 78 77  Resp: 16 17 14 15   Temp: 97.6 F (36.4 C) 97.7 F (36.5 C) 97.7 F (36.5 C) 97.8 F (36.6 C)  TempSrc:      SpO2: 96% 94% 95% 99%  Weight:        Intake/Output Summary (Last 24 hours) at 04/10/2020 1436 Last data filed at 04/09/2020 2353 Gross per 24 hour  Intake 969.63 ml  Output 900 ml  Net 69.63 ml   Filed Weights   04/09/20 0920  Weight: 92.1 kg    Examination:  General exam: Appears calm and comfortable  Respiratory system: Clear to auscultation. Respiratory effort normal. Cardiovascular system: S1 & S2 heard, RRR.  Gastrointestinal system: Soft, nontender, nondistended, bowel sounds positive. Central nervous system: Alert and oriented. No focal neurological deficits. Extremities: No edema, no cyanosis, pulses intact and symmetrical. Psychiatry: Judgement and insight appear normal.     DVT prophylaxis: Eliques Code Status: Full Family  Communication: Call brother with no response. Disposition Plan:  Status is: Inpatient  Remains inpatient appropriate because:Inpatient level of care appropriate due to severity of illness   Dispo: The patient is from: Home              Anticipated d/c is to: Home              Anticipated d/c date is: 1 day              Patient currently is not  medically stable to d/c.   Difficult to place patient No              Level of care: Med-Surg  Consultants:   None  Procedures:  Antimicrobials:   Data Reviewed: I have personally reviewed following labs and imaging studies  CBC: Recent Labs  Lab 04/07/20 2201 04/08/20 0500 04/09/20 0603  WBC 6.3 6.4 6.5  NEUTROABS 4.8  --   --   HGB 13.5 13.7 12.9*  HCT 40.7 40.6 38.2*  MCV 93.1 90.6 92.0  PLT 241 256 232   Basic Metabolic Panel: Recent Labs  Lab 04/07/20 2201 04/08/20 0500 04/09/20 0603  NA 139 136 136  K 3.8 4.0 3.8  CL 105 99 104  CO2 21* 24 21*  GLUCOSE 122* 136* 92  BUN 27* 28* 25*  CREATININE 0.88 0.93 0.83  CALCIUM 8.0* 9.1 8.1*   GFR: Estimated Creatinine Clearance: 83.1 mL/min (by C-G formula based on SCr of 0.83 mg/dL). Liver Function Tests: Recent Labs  Lab 04/07/20 2201  AST 28  ALT 25  ALKPHOS 51  BILITOT 1.5*  PROT 5.9*  ALBUMIN 3.1*   No results for input(s): LIPASE, AMYLASE in the last 168 hours. No results for input(s): AMMONIA in the last 168 hours. Coagulation Profile: No results for input(s): INR, PROTIME in the last 168 hours. Cardiac Enzymes: No results for input(s): CKTOTAL, CKMB, CKMBINDEX, TROPONINI in the last 168 hours. BNP (last 3 results) No results for input(s): PROBNP in the last 8760 hours. HbA1C: Recent Labs    04/08/20 1744  HGBA1C 5.8*   CBG: Recent Labs  Lab 04/09/20 1127 04/09/20 1616 04/09/20 2042 04/10/20 0733 04/10/20 1236  GLUCAP 82 88 83 101* 94   Lipid Profile: No results for input(s): CHOL, HDL, LDLCALC, TRIG, CHOLHDL, LDLDIRECT in the last 72 hours. Thyroid Function Tests: Recent Labs    04/09/20 1004  TSH 1.196   Anemia Panel: Recent Labs    04/09/20 1004  VITAMINB12 268   Sepsis Labs: Recent Labs  Lab 04/07/20 2201 04/08/20 0111 04/08/20 1744  LATICACIDVEN 2.9* 2.5* 1.4    Recent Results (from the past 240 hour(s))  SARS Coronavirus 2 by RT PCR (hospital order,  performed in [facility] hospital lab) Nasopharyngeal Nasopharyngeal Swab     Status: None   Collection Time: 04/07/20 10:01 PM   Specimen: Nasopharyngeal Swab  Result Value Ref Range Status   SARS Coronavirus 2 NEGATIVE NEGATIVE Final    Comment: (NOTE) SARS-CoV-2 target nucleic acids are NOT DETECTED.  The SARS-CoV-2 RNA is generally detectable in upper and lower respiratory specimens during the acute phase of infection. The lowest concentration of SARS-CoV-2 viral copies this assay can detect is 250 copies / mL. A negative result does not preclude SARS-CoV-2 infection and should not be used as the sole basis for treatment or other patient management decisions.  A negative result may occur with improper specimen collection / handling, submission of specimen other than nasopharyngeal swab,  presence of viral mutation(s) within the areas targeted by this assay, and inadequate number of viral copies (<250 copies / mL). A negative result must be combined with clinical observations, patient history, and epidemiological information.  Fact Sheet for Patients:   BoilerBrush.com.cy  Fact Sheet for Healthcare Providers: https://pope.com/  This test is not yet approved or  cleared by the [place] FDA and has been authorized for detection and/or diagnosis of SARS-CoV-2 by FDA under an Emergency Use Authorization (EUA).  This EUA will remain in effect (meaning this test can be used) for the duration of the COVID-19 declaration under Section 564(b)(1) of the Act, 21 U.S.C. section 360bbb-3(b)(1), unless the authorization is terminated or revoked sooner.  Performed at [facility], [address] [place], [place] [address]   Culture, blood (routine x 2)     Status: None (Preliminary result)   Collection Time: 04/07/20 10:02 PM   Specimen: Left Antecubital; Blood  Result Value Ref Range Status   Specimen Description LEFT  ANTECUBITAL  Final   Special Requests   Final    BOTTLES DRAWN AEROBIC AND ANAEROBIC Blood Culture adequate volume   Culture   Final    NO GROWTH 3 DAYS Performed at [facility], [address] [place], [place] [address]    Report Status PENDING  Incomplete  Culture, blood (routine x 2)     Status: None (Preliminary result)   Collection Time: 04/07/20 10:07 PM   Specimen: BLOOD LEFT FOREARM  Result Value Ref Range Status   Specimen Description BLOOD LEFT FOREARM  Final   Special Requests   Final    BOTTLES DRAWN AEROBIC AND ANAEROBIC Blood Culture adequate volume   Culture   Final    NO GROWTH 3 DAYS Performed at [facility], [address] [place], [place] [address]    Report Status PENDING  Incomplete     Radiology Studies: No results found.  Scheduled Meds: . amiodarone  200 mg Oral Daily  . apixaban  5 mg Oral BID  . aspirin EC  81 mg Oral Daily  . atorvastatin  80 mg Oral Daily  . buPROPion  300 mg Oral Daily  . carvedilol  25 mg Oral BID  . docusate sodium  100 mg Oral Daily  . ezetimibe  10 mg Oral Daily  . gabapentin  300 mg Oral BID  . insulin aspart  0-9 Units Subcutaneous TID PC & HS  . sacubitril-valsartan  1 tablet Oral BID  . sertraline  25 mg Oral Daily  . vitamin B-12  1,000 mcg Oral Daily   Continuous Infusions:   LOS: 2 days   Time spent: 30 minutes.  Arnetha Courser, MD [facility]  If 7PM-7AM, please contact night-coverage [email or web address]  04/10/2020, 2:36 PM   This record has been created using Conservation officer, historic buildings. Errors have been sought and corrected,but may not always be located. Such creation errors do not reflect on the standard of care.

## 2020-04-11 DIAGNOSIS — Z7189 Other specified counseling: Secondary | ICD-10-CM

## 2020-04-11 LAB — CBC
HCT: 40.3 % (ref 39.0–52.0)
Hemoglobin: 13.7 g/dL (ref 13.0–17.0)
MCH: 30.7 pg (ref 26.0–34.0)
MCHC: 34 g/dL (ref 30.0–36.0)
MCV: 90.4 fL (ref 80.0–100.0)
Platelets: 283 10*3/uL (ref 150–400)
RBC: 4.46 MIL/uL (ref 4.22–5.81)
RDW: 14.1 % (ref 11.5–15.5)
WBC: 7.1 10*3/uL (ref 4.0–10.5)
nRBC: 0 % (ref 0.0–0.2)

## 2020-04-11 LAB — GLUCOSE, CAPILLARY
Glucose-Capillary: 94 mg/dL (ref 70–99)
Glucose-Capillary: 115 mg/dL — ABNORMAL HIGH (ref 70–99)
Glucose-Capillary: 116 mg/dL — ABNORMAL HIGH (ref 70–99)

## 2020-04-11 MED ORDER — MAGNESIUM HYDROXIDE 400 MG/5ML PO SUSP: 30.0000 mL | Freq: Every day | ORAL | 0 refills | Status: DC | PRN

## 2020-04-11 MED ORDER — CYANOCOBALAMIN 1000 MCG PO TABS: 1000.0000 ug | ORAL_TABLET | Freq: Every day | ORAL | 1 refills | Status: DC

## 2020-04-11 NOTE — Plan of Care (Signed)

## 2020-04-11 NOTE — Care Management Important Message (Signed)
Important Message  Patient Details  Name: Devin [name]. MRN: [id number] Date of Birth: [birth year]   Medicare Important Message Given:  Yes     [name] 04/11/2020, 11:54 AM

## 2020-04-11 NOTE — TOC Initial Note (Signed)
Transition of Care (TOC) - Initial/Assessment Note    Patient Details  Name: Devin [name]. MRN: [id number] Date of Birth: November 16, 1945  Transition of Care [facility]) CM/SW Contact:    Shelbie Ammons, RN Phone Number: 04/11/2020, 10:49 AM  Clinical Narrative:    RNCM met with patient at bedside, patient resting in bed in no acute distress. Patient lives alone but reports that he has many friends that can assist him and neighbors that are always around. Patient reports that he does not want to go to rehab and it is his plan to return home at discharge. He is agreeable to home health and reports that he has all the equipment that he could need including a rolling walker.  RNCM reached out to [place] with [facility] to see if she can accept referral.                Expected Discharge Plan: [place] Barriers to Discharge: No Barriers Identified   Patient Goals and CMS Choice        Expected Discharge Plan and Services Expected Discharge Plan: [place] Choice: [place] arrangements for the past 2 months: Single Family Home                           HH Arranged: PT HH Agency: [facility] Date [place]: 04/11/20 Time HH Agency Contacted: 1048 Representative spoke with at [place]: [place]  Prior Living Arrangements/Services Living arrangements for the past 2 months: [place] with:: Self Patient language and need for interpreter reviewed:: Yes Do you feel safe going back to the place where you live?: Yes      Need for Family Participation in Patient Care: Yes (Comment) Care giver support system in place?: Yes (comment)   Criminal Activity/Legal Involvement Pertinent to Current Situation/Hospitalization: No - Comment as needed  Activities of Daily Living Home Assistive Devices/Equipment: Walker (specify type) ADL Screening (condition at time of admission) Patient's  cognitive ability adequate to safely complete daily activities?: Yes Is the patient deaf or have difficulty hearing?: No Does the patient have difficulty seeing, even when wearing glasses/contacts?: No Does the patient have difficulty concentrating, remembering, or making decisions?: Yes Patient able to express need for assistance with ADLs?: Yes Does the patient have difficulty dressing or bathing?: No Independently performs ADLs?: Yes (appropriate for developmental age) Does the patient have difficulty walking or climbing stairs?: No Weakness of Legs: None Weakness of Arms/Hands: None  Permission Sought/Granted                  Emotional Assessment              Admission diagnosis:  Generalized weakness [R53.1] Elevated lactic acid level [R79.89] Altered mental status, unspecified altered mental status type [R41.82] AMS (altered mental status) [R41.82] Patient Active Problem List   Diagnosis Date Noted  . Elevated lactic acid level   . Generalized weakness 04/09/2020  . Fall at home 04/09/2020  . Gastroenteritis 04/09/2020  . AMS (altered mental status) 04/08/2020  . Hypoxia   . Congestive heart failure (CHF) ([place]) 12/13/2019  . Hyponatremia 12/06/2019  . Chest pain 05/21/2019  . Unstable angina ([place])   . Ischemic chest pain ([place]) 05/16/2019  . CAD (coronary artery disease) 05/16/2019  . Type 2 diabetes mellitus without complication ([place]) 39/76/7341  . Essential hypertension  05/16/2019  . Depression 05/16/2019  . GERD (gastroesophageal reflux disease) 05/16/2019   PCP:  Valera Castle, MD Pharmacy:   [id number] [place], [place] [address] Phone: [phone] Fax: [phone]     Social Determinants of Health (SDOH) Interventions    Readmission Risk Interventions No flowsheet data found.

## 2020-04-11 NOTE — Consult Note (Signed)
Consultation Note Date: 04/11/2020   Patient Name: Devin [name] [facility].  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 Workup revealed gastroenteritis, dehydration. He has stabilized- PT recommended SNF rehab but patient would like to d/c home. Palliative medicine consulted for [place].    Clinical Assessment and Goals of Care:  I have reviewed medical records including EPIC notes, labs and imaging, examined the patient and met at bedside with the patient to discuss diagnosis prognosis, GOC, EOL wishes, disposition and options.  I introduced Palliative Medicine as specialized medical care for people living with serious illness. It focuses on providing relief from the symptoms and stress of a serious illness.   We discussed a brief life review of the patient.  He lives in [place] alone with his dog.  He enjoys traveling, has a motor home, enjoys going on cruises with his friend.  Spouse died 6 years ago.  As far as functional and nutritional status-prior to this admission he was utilizing a walker in the home able to complete all ADLs.  He does feel that he has not returned to his same strength that he was at prior to his heart attack last year even though he has been able to participate actively in physical therapy and rehab.   We discussed his current illness and what it means in the larger context of his on-going co-morbidities.  Natural disease trajectory was discussed.  The trajectory pattern of insults and recovery related to chronic heart failure was reviewed.   He does not have current issues with his heart failure causing any shortness of breath or difficulties at home, he is not requiring additional oxygen.  I attempted to elicit values and goals of care important to the patient.  His primary goal is to remain living at home and participating with physical therapy there.  He is aware that in the future he may decline and need more assistance.   Advanced directives, concepts specific to code status, artifical feeding and hydration, and rehospitalization were considered and discussed.  Patient has healthcare power of attorney designating his brother, he also has a living will stating he would not want long-term artificial life support.  He desires full CODE STATUS at this time.  Questions and concerns were addressed.  The family was encouraged to call with questions or concerns.    Primary Decision Maker PATIENT    SUMMARY OF RECOMMENDATIONS -Full scope/full code -D/C home with home health    Code Status/Advance Care Planning:  Full code   Additional Recommendations (Limitations, Scope, Preferences):  Full Scope Treatment  Prognosis:    Unable to determine  Discharge Planning: Home with Home Health  Primary Diagnoses: Present on Admission: .  AMS (altered mental status) . Gastroenteritis   I have reviewed the medical record, interviewed the patient and family, and examined the patient. The following aspects are pertinent.  Past Medical History:  Diagnosis Date  . Arrhythmia    atrial fibrillation  . CAD (coronary artery disease)   . CHF (congestive heart failure) ([place])   . Diabetes mellitus without complication ([place])   . Hypertension   . MI, old   . Sleep apnea    Social History   Socioeconomic History  . Marital status: Widowed    Spouse name: Not on file  . Number of children: Not on file  . Years of education: Not on file  . Highest education level: Not on file  Occupational History  . Not on file  Tobacco Use   . Smoking status: Former Research scientist (life sciences)  . Smokeless tobacco: Never Used  . Tobacco comment: Quit over 40 years ago  Vaping Use  . Vaping Use: Never used  Substance and Sexual Activity  . Alcohol use: Not Currently    Comment: social  . Drug use: Never  . Sexual activity: Not on file  Other Topics Concern  . Not on file  Social History Narrative  . Not on file   Social Determinants of Health   Financial Resource Strain: Not on file  Food Insecurity: Not on file  Transportation Needs: Not on file  Physical Activity: Not on file  Stress: Not on file  Social Connections: Not on file   Scheduled Meds: . amiodarone  200 mg Oral Daily  . apixaban  5 mg Oral BID  . aspirin EC  81 mg Oral Daily  . atorvastatin  80 mg Oral Daily  . buPROPion  300 mg Oral Daily  . carvedilol  25 mg Oral BID  . docusate sodium  100 mg Oral Daily  . ezetimibe  10 mg Oral Daily  . gabapentin  300 mg Oral BID  . insulin aspart  0-9 Units Subcutaneous TID PC & HS  . sacubitril-valsartan  1 tablet Oral BID  . sertraline  25 mg Oral Daily  . sodium phosphate  1 enema Rectal Once  . vitamin B-12  1,000 mcg Oral Daily   Continuous Infusions: PRN Meds:.acetaminophen **OR** acetaminophen, cyclobenzaprine, EPINEPHrine, guaiFENesin-dextromethorphan, magnesium hydroxide, nitroGLYCERIN, ondansetron **OR** ondansetron (ZOFRAN) IV, promethazine, traZODone Medications Prior to Admission:  Prior to Admission medications   Medication Sig Start Date End Date Taking? Authorizing Provider  amiodarone (PACERONE) 400 MG tablet Take 1 tablet (400 mg total) by mouth daily. Patient taking differently: Take 200 mg by mouth daily. 12/16/19  Yes Lorella Nimrod, MD  apixaban (ELIQUIS) 5 MG TABS tablet Take 5 mg by mouth 2 (two) times daily.    Yes [provider]  atorvastatin (LIPITOR) 80 MG tablet Take 1 tablet (80 mg total) by mouth daily. 05/16/19 05/15/20 Yes Agbata, Tochukwu, MD  buPROPion (WELLBUTRIN XL) 300 MG 24 hr  tablet Take 300 mg by mouth daily.    Yes [provider]  carvedilol (COREG) 25 MG tablet Take 1 tablet (25 mg total) by mouth 2 (two) times daily. 05/16/19 05/15/20 Yes Agbata, Tochukwu, MD  cyclobenzaprine (FLEXERIL) 10 MG tablet Take 1 tablet by mouth 2 (two) times daily as needed for muscle spasms. 02/09/20 02/08/21 Yes [provider]  docusate sodium (COLACE) 100 MG capsule Take 100 mg by mouth daily. 04/01/20  Yes [provider]  ENTRESTO 49-51 MG Take 1 tablet by mouth 2 (two) times daily. 11/09/19  Yes [provider]  EPINEPHrine 0.3 mg/0.3 mL IJ SOAJ injection Inject 0.3 mg into the muscle as needed for anaphylaxis.   Yes [provider]  ezetimibe (ZETIA) 10 MG tablet Take 10 mg by mouth daily. 04/06/19  Yes [provider]  furosemide (LASIX) 20 MG tablet Take 1 tablet (20 mg total) by mouth daily. 12/15/19 12/14/20 Yes Lorella Nimrod, MD  gabapentin (NEURONTIN) 300 MG capsule Take 300 mg by mouth 2 (two) times daily. 05/01/19  Yes [provider]  metFORMIN (GLUCOPHAGE) 500 MG tablet Take 500 mg by mouth 2 (two) times daily. 02/17/19  Yes [provider]  nitroGLYCERIN (NITROSTAT) 0.4 MG SL tablet Place 0.4 mg under the tongue every 5 (five) minutes as needed for chest pain.   Yes [provider]  sertraline (ZOLOFT) 25 MG tablet Take 25 mg by mouth daily. 11/27/19  Yes [provider]  sildenafil (VIAGRA) 50 MG tablet Take 50 mg by mouth daily as needed for erectile dysfunction. Patient not taking: Reported on 04/08/2020    [provider]  benazepril (LOTENSIN) 20 MG tablet Take 20 mg by mouth daily. 03/19/19 08/13/19  [provider]   Allergies  Allergen Reactions  . Ace Inhibitors Swelling  . Morphine Swelling  . 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   Review of Systems  Constitutional: Positive for activity change. Negative for unexpected weight change.  Gastrointestinal:  Positive for nausea.  Psychiatric/Behavioral: Positive for hallucinations.    Physical Exam Vitals and nursing note reviewed.  Constitutional:      General: He is not in acute distress.    Appearance: He is not ill-appearing.  Cardiovascular:     Rate and Rhythm: Normal rate.  Pulmonary:     Effort: Pulmonary effort is normal.  Neurological:     Mental Status: He is alert and oriented to person, place, and time.  Psychiatric:        Mood and Affect: Mood normal.        Behavior: Behavior normal.        Thought Content: Thought content normal.        Judgment: Judgment normal.     Vital Signs: BP 128/77 (BP Location: Right Arm)   Pulse 70   Temp 98.1 F (36.7 C)   Resp 17   Wt 92.1 kg   SpO2 95%   BMI 27.53 kg/m  Pain Scale: 0-10   Pain Score: 0-No pain   SpO2: SpO2: 95 % O2 Device:SpO2: 95 % O2 Flow Rate: .   IO: Intake/output summary:   Intake/Output Summary (Last 24 hours) at 04/11/2020 1142 Last data filed at 04/11/2020 0600 Gross per 24 hour  Intake 240 ml  Output 601 ml  Net -361 ml    LBM: Last BM Date: 04/10/20 Baseline Weight: Weight: 92.1 kg Most recent weight: Weight: 92.1 kg     Palliative Assessment/Data: PPS: 60%     Thank you for this consult. Palliative medicine will continue to follow and assist as needed.   Time In: 1041 Time Out: 1152 Time Total: 71 mins Greater than 50%  of this time was spent counseling and coordinating care related to the above assessment and plan.  Signed by: Mariana Kaufman, AGNP-C Palliative Medicine    Please contact Palliative Medicine Team phone at [phone] for questions and concerns.  For individual provider: See Shea Evans

## 2020-04-11 NOTE — Discharge Summary (Signed)
Physician Discharge Summary  Devin Lank Kipper Jr. YKD:[id number] DOB: February 09, 1946 DOA: 04/07/2020  PCP: Dione Housekeeper, MD  Admit date: 04/07/2020 Discharge date: 04/11/2020  Admitted From: Home Disposition: Home  Recommendations for Outpatient Follow-up:  1. Follow up with PCP in 1-2 weeks 2. Please obtain BMP/CBC in one week 3. 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 He also complained of nausea, vomiting and diarrhea.  He was admitted to the hospital for gastroenteritis, dehydration and generalized weakness leading to falls at home. He was treated with IV fluids. Clinically improved.  PT recommending SNF but he wants to go home with home health stating that he has some support from friends and family. He was discharged home with home health services.  Patient has acute metabolic encephalopathy secondary to dehydration and gastritis which was resolved. He was also found to have B12 of 268 which is low normal, we started him on supplement.  Patient has an history of HFrEF with EF of 40 to 45% and grade 1 diastolic dysfunction according to the echo done in October 2021.  Appears euvolemic during current hospitalization and he will continue with his home meds on discharge and follow-up with his cardiologist for heart failure and paroxysmal atrial fibrillation. His heart rate remained well controlled and he will continue with amiodarone, carvedilol and Eliquis.  He will continue rest of his home  medications and follow-up with his providers for chronic conditions.  Discharge Diagnoses:  Principal Problem:   Fall at home Active Problems:   AMS (altered mental status)   Generalized weakness   Gastroenteritis   Elevated lactic acid level   Discharge Instructions  Discharge Instructions    Diet - low sodium heart healthy   Complete by: As directed    Discharge instructions   Complete by: As directed    It was pleasure taking care of you. According to our physical therapist evaluation you need rehab and a lot of assistance. You are being discharged to home with home health services according to your wishes, be careful to avoid further falls. Follow-up with your primary care provider and cardiologist for further management. Keep yourself well-hydrated.   Increase activity slowly   Complete by: As directed      Allergies as of 04/11/2020      Reactions   Ace Inhibitors Swelling   Morphine Swelling   Yellow Jacket Venom [bee Venom] Anaphylaxis      Medication List    TAKE these medications   amiodarone 400 MG tablet Commonly known as: PACERONE Take 1 tablet (400 mg total) by mouth daily. What changed: how much to take   apixaban 5 MG Tabs tablet Commonly known as: ELIQUIS Take 5 mg by mouth 2 (two) times daily.   atorvastatin 80 MG tablet Commonly known as: Lipitor Take 1 tablet (80 mg total) by mouth daily.   buPROPion 300 MG 24 hr tablet Commonly known as: WELLBUTRIN XL Take 300 mg by mouth daily.   carvedilol 25 MG tablet Commonly known as: Coreg Take 1 tablet (25 mg total) by mouth 2 (two) times daily.   cyanocobalamin  1000 MCG tablet Take 1 tablet (1,000 mcg total) by mouth daily.   cyclobenzaprine 10 MG tablet Commonly known as: FLEXERIL Take 1 tablet by mouth 2 (two) times daily as needed for muscle spasms.   docusate sodium 100 MG capsule Commonly known as: COLACE Take 100 mg by mouth daily.   Entresto 49-51 MG Generic drug:  sacubitril-valsartan Take 1 tablet by mouth 2 (two) times daily.   EPINEPHrine 0.3 mg/0.3 mL Soaj injection Commonly known as: EPI-PEN Inject 0.3 mg into the muscle as needed for anaphylaxis.   ezetimibe 10 MG tablet Commonly known as: ZETIA Take 10 mg by mouth daily.   furosemide 20 MG tablet Commonly known as: Lasix Take 1 tablet (20 mg total) by mouth daily.   gabapentin 300 MG capsule Commonly known as: NEURONTIN Take 300 mg by mouth 2 (two) times daily.   magnesium hydroxide 400 MG/5ML suspension Commonly known as: MILK OF MAGNESIA Take 30 mLs by mouth daily as needed for mild constipation.   metFORMIN 500 MG tablet Commonly known as: GLUCOPHAGE Take 500 mg by mouth 2 (two) times daily.   nitroGLYCERIN 0.4 MG SL tablet Commonly known as: NITROSTAT Place 0.4 mg under the tongue every 5 (five) minutes as needed for chest pain.   sertraline 25 MG tablet Commonly known as: ZOLOFT Take 25 mg by mouth daily.   sildenafil 50 MG tablet Commonly known as: VIAGRA Take 50 mg by mouth daily as needed for erectile dysfunction.       Follow-up Information    Olmedo, Joycie PeekMario Ernesto, MD. Schedule an appointment as soon as possible for a visit.   Specialty: Family Medicine Contact information: [address] [place] [id number] [phone]              Allergies  Allergen Reactions  . Ace Inhibitors Swelling  . Morphine Swelling  . 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    Consultations:  None  Procedures/Studies: DG Chest 2 View  Result Date: 04/07/2020 CLINICAL DATA:  Weakness EXAM: CHEST - 2 VIEW COMPARISON:  12/13/2019 FINDINGS: Post sternotomy changes. Right-sided stimulator device with ascending lead. No focal opacity or pleural effusion. Stable cardiomediastinal silhouette with aortic atherosclerosis. No pneumothorax IMPRESSION: No active cardiopulmonary disease. Electronically Signed   By: Jasmine PangKim  Fujinaga M.D.   On: 04/07/2020 22:23   CT Head  Wo Contrast  Result Date: 04/07/2020 CLINICAL DATA:  Fall, altered mental status, neck injury EXAM: CT HEAD WITHOUT CONTRAST CT CERVICAL SPINE WITHOUT CONTRAST TECHNIQUE: Multidetector CT imaging of the head and cervical spine was performed following the standard protocol without intravenous contrast. Multiplanar CT image reconstructions of the cervical spine were also generated. COMPARISON:  None. FINDINGS: CT HEAD FINDINGS Brain: Normal anatomic configuration. Parenchymal volume loss is commensurate with the patient's age. Mild periventricular white matter changes are present likely reflecting the sequela of small vessel ischemia. No abnormal intra or extra-axial mass lesion or fluid collection. No abnormal mass effect or midline shift. No evidence of acute intracranial hemorrhage or infarct. Ventricular size is normal. Cerebellum unremarkable. Vascular: No asymmetric hyperdense vasculature at the skull base. Skull: Intact Sinuses/Orbits: Paranasal sinuses are clear. Orbits are unremarkable. Other: Mastoid air cells and middle ear cavities are clear. CT CERVICAL SPINE FINDINGS Alignment: Normal cervical lordosis.  No listhesis. Skull base and vertebrae: The craniocervical junction is unremarkable. Atlantal dental interval is normal. No acute fracture of the cervical spine. Soft tissues and spinal canal: No prevertebral fluid or swelling. No visible canal hematoma. Posterior  disc osteophyte complex at C4-5 effaces the anterior canal space and abuts the thecal sac without remodeling. Posterior disc osteophyte complex at C5-6, asymmetrically more severe on the left, mildly narrows the central canal and abuts the thecal sac without remodeling. Broad-based posterior disc osteophyte complex at C6-7 abuts and mildly flattens the thecal sac in keeping with moderate central canal stenosis. The AP diameter of the spinal canal at this level is 6-7 mm. Disc levels: Review of the sagittal reformats demonstrates moderate  atlantodental degenerate arthritis. There is intervertebral disc space narrowing and endplate remodeling throughout the cervical spine, most severe at C4-C7 with prominent anterior disc osteophytes noted, in keeping with mild to moderate degenerative disc disease. Vertebral body height has been preserved. Review of the a axial images demonstrates multilevel uncovertebral and facet arthrosis resulting in moderate right neural foraminal narrowing at C2-3, moderate to severe right and moderate left neural foraminal narrowing at C3-4, moderate severe right neural foraminal narrowing at C4-5, moderate left neural foraminal narrowing at C5-6, and moderate bilateral neural foraminal narrowing at C6-7. Upper chest: Unremarkable Other: Remote left first and second rib fractures are noted. IMPRESSION: No acute intracranial injury.  No calvarial fracture. No acute fracture or listhesis of the cervical spine. 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 Electronically Signed   By: Helyn Numbers MD   On: 04/07/2020 22:57   CT Cervical Spine Wo Contrast  Result Date: 04/07/2020 CLINICAL DATA:  Fall, altered mental status, neck injury EXAM: CT HEAD WITHOUT CONTRAST CT CERVICAL SPINE WITHOUT CONTRAST TECHNIQUE: Multidetector CT imaging of the head and cervical spine was performed following the standard protocol without intravenous contrast. Multiplanar CT image reconstructions of the cervical spine were also generated. COMPARISON:  None. FINDINGS: CT HEAD FINDINGS Brain: Normal anatomic configuration. Parenchymal volume loss is commensurate with the patient's age. Mild periventricular white matter changes are present likely reflecting the sequela of small vessel ischemia. No abnormal intra or extra-axial mass lesion or fluid collection. No abnormal mass effect or midline shift. No  evidence of acute intracranial hemorrhage or infarct. Ventricular size is normal. Cerebellum unremarkable. Vascular: No asymmetric hyperdense vasculature at the skull base. Skull: Intact Sinuses/Orbits: Paranasal sinuses are clear. Orbits are unremarkable. Other: Mastoid air cells and middle ear cavities are clear. CT CERVICAL SPINE FINDINGS Alignment: Normal cervical lordosis.  No listhesis. Skull base and vertebrae: The craniocervical junction is unremarkable. Atlantal dental interval is normal. No acute fracture of the cervical spine. Soft tissues and spinal canal: No prevertebral fluid or swelling. No visible canal hematoma. Posterior disc osteophyte complex at C4-5 effaces the anterior canal space and abuts the thecal sac without remodeling. Posterior disc osteophyte complex at C5-6, asymmetrically more severe on the left, mildly narrows the central canal and abuts the thecal sac without remodeling. Broad-based posterior disc osteophyte complex at C6-7 abuts and mildly flattens the thecal sac in keeping with moderate central canal stenosis. The AP diameter of the spinal canal at this level is 6-7 mm. Disc levels: Review of the sagittal reformats demonstrates moderate atlantodental degenerate arthritis. There is intervertebral disc space narrowing and endplate remodeling throughout the cervical spine, most severe at C4-C7 with prominent anterior disc osteophytes noted, in keeping with mild to moderate degenerative disc disease. Vertebral body height has been preserved. Review of the a axial images demonstrates multilevel uncovertebral and facet arthrosis resulting in moderate right neural foraminal narrowing  at C2-3, moderate to severe right and moderate left neural foraminal narrowing at C3-4, moderate severe right neural foraminal narrowing at C4-5, moderate left neural foraminal narrowing at C5-6, and moderate bilateral neural foraminal narrowing at C6-7. Upper chest: Unremarkable Other: Remote left first and  second rib fractures are noted. IMPRESSION: No acute intracranial injury.  No calvarial fracture. No acute fracture or listhesis of the cervical spine. 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 Electronically Signed   By: Helyn Numbers MD   On: 04/07/2020 22:57     Subjective: Patient was seen and examined today.  No new complaint.  Denies any more nausea or vomiting.  Had 2 bowel movement after getting laxative.  Able to eat well.  He wants to go home.  Discharge Exam: Vitals:   04/11/20 0734 04/11/20 1134  BP: 118/69 128/77  Pulse: 70 70  Resp: 16 17  Temp: 98 F (36.7 C) 98.1 F (36.7 C)  SpO2: 95% 95%   Vitals:   04/11/20 0104 04/11/20 0531 04/11/20 0734 04/11/20 1134  BP: 127/75 124/72 118/69 128/77  Pulse: 77 72 70 70  Resp: 19  16 17   Temp: 98.5 F (36.9 C) 97.8 F (36.6 C) 98 F (36.7 C) 98.1 F (36.7 C)  TempSrc: Oral Oral    SpO2: 96% 95% 95% 95%  Weight:        General: Pt is alert, awake, not in acute distress Cardiovascular: RRR, S1/S2 +, no rubs, no gallops Respiratory: CTA bilaterally, no wheezing, no rhonchi Abdominal: Soft, NT, ND, bowel sounds + Extremities: no edema, no cyanosis   The results of significant diagnostics from this hospitalization (including imaging, microbiology, ancillary and laboratory) are listed below for reference.    Microbiology: Recent Results (from the past 240 hour(s))  SARS Coronavirus 2 by RT PCR (hospital order, performed in [facility] hospital lab) Nasopharyngeal Nasopharyngeal Swab     Status: None   Collection Time: 04/07/20 10:01 PM   Specimen: Nasopharyngeal Swab  Result Value Ref Range Status   SARS Coronavirus 2 NEGATIVE NEGATIVE Final    Comment: (NOTE) SARS-CoV-2 target nucleic acids are NOT DETECTED.  The SARS-CoV-2 RNA is generally detectable in upper and  lower respiratory specimens during the acute phase of infection. The lowest concentration of SARS-CoV-2 viral copies this assay can detect is 250 copies / mL. A negative result does not preclude SARS-CoV-2 infection and should not be used as the sole basis for treatment or other patient management decisions.  A negative result may occur with improper specimen collection / handling, submission of specimen other than nasopharyngeal swab, presence of viral mutation(s) within the areas targeted by this assay, and inadequate number of viral copies (<250 copies / mL). A negative result must be combined with clinical observations, patient history, and epidemiological information.  Fact Sheet for Patients:   BoilerBrush.com.cy  Fact Sheet for Healthcare Providers: https://pope.com/  This test is not yet approved or  cleared by the [place] FDA and has been authorized for detection and/or diagnosis of SARS-CoV-2 by FDA under an Emergency Use Authorization (EUA).  This EUA will remain in effect (meaning this test can be used) for the duration of the COVID-19 declaration under Section 564(b)(1) of the Act, 21 U.S.C. section 360bbb-3(b)(1), unless the authorization is terminated or revoked sooner.  Performed at [facility], [address] [place], [place] [address]  Culture, blood (routine x 2)     Status: None (Preliminary result)   Collection Time: 04/07/20 10:02 PM   Specimen: Left Antecubital; Blood  Result Value Ref Range Status   Specimen Description LEFT ANTECUBITAL  Final   Special Requests   Final    BOTTLES DRAWN AEROBIC AND ANAEROBIC Blood Culture adequate volume   Culture   Final    NO GROWTH 4 DAYS Performed at [facility], [address] [place], [place] [address]    Report Status PENDING  Incomplete  Culture, blood (routine x 2)     Status: None (Preliminary result)   Collection Time: 04/07/20  10:07 PM   Specimen: BLOOD LEFT FOREARM  Result Value Ref Range Status   Specimen Description BLOOD LEFT FOREARM  Final   Special Requests   Final    BOTTLES DRAWN AEROBIC AND ANAEROBIC Blood Culture adequate volume   Culture   Final    NO GROWTH 4 DAYS Performed at [facility], [address] [place], [place] [address]    Report Status PENDING  Incomplete     Labs: BNP (last 3 results) Recent Labs    12/13/19 1505  BNP 771.2*   Basic Metabolic Panel: Recent Labs  Lab 04/07/20 2201 04/08/20 0500 04/09/20 0603  NA 139 136 136  K 3.8 4.0 3.8  CL 105 99 104  CO2 21* 24 21*  GLUCOSE 122* 136* 92  BUN 27* 28* 25*  CREATININE 0.88 0.93 0.83  CALCIUM 8.0* 9.1 8.1*   Liver Function Tests: Recent Labs  Lab 04/07/20 2201  AST 28  ALT 25  ALKPHOS 51  BILITOT 1.5*  PROT 5.9*  ALBUMIN 3.1*   No results for input(s): LIPASE, AMYLASE in the last 168 hours. No results for input(s): AMMONIA in the last 168 hours. CBC: Recent Labs  Lab 04/07/20 2201 04/08/20 0500 04/09/20 0603 04/11/20 0420  WBC 6.3 6.4 6.5 7.1  NEUTROABS 4.8  --   --   --   HGB 13.5 13.7 12.9* 13.7  HCT 40.7 40.6 38.2* 40.3  MCV 93.1 90.6 92.0 90.4  PLT 241 256 232 283   Cardiac Enzymes: No results for input(s): CKTOTAL, CKMB, CKMBINDEX, TROPONINI in the last 168 hours. BNP: Invalid input(s): POCBNP CBG: Recent Labs  Lab 04/10/20 1236 04/10/20 1637 04/11/20 0736 04/11/20 0921 04/11/20 1133  GLUCAP 94 92 94 115* 116*   D-Dimer No results for input(s): DDIMER in the last 72 hours. Hgb A1c Recent Labs    04/08/20 1744  HGBA1C 5.8*   Lipid Profile No results for input(s): CHOL, HDL, LDLCALC, TRIG, CHOLHDL, LDLDIRECT in the last 72 hours. Thyroid function studies Recent Labs    04/09/20 1004  TSH 1.196   Anemia work up Recent Labs    04/09/20 1004  VITAMINB12 268   Urinalysis    Component Value Date/Time   COLORURINE YELLOW (A) 04/08/2020 0111   APPEARANCEUR  HAZY (A) 04/08/2020 0111   LABSPEC 1.023 04/08/2020 0111   PHURINE 5.0 04/08/2020 0111   GLUCOSEU NEGATIVE 04/08/2020 0111   HGBUR NEGATIVE 04/08/2020 0111   BILIRUBINUR NEGATIVE 04/08/2020 0111   KETONESUR NEGATIVE 04/08/2020 0111   PROTEINUR NEGATIVE 04/08/2020 0111   NITRITE NEGATIVE 04/08/2020 0111   LEUKOCYTESUR NEGATIVE 04/08/2020 0111   Sepsis Labs Invalid input(s): PROCALCITONIN,  WBC,  LACTICIDVEN Microbiology Recent Results (from the past 240 hour(s))  SARS Coronavirus 2 by RT PCR (hospital order, performed in [facility] hospital lab) Nasopharyngeal Nasopharyngeal Swab  Status: None   Collection Time: 04/07/20 10:01 PM   Specimen: Nasopharyngeal Swab  Result Value Ref Range Status   SARS Coronavirus 2 NEGATIVE NEGATIVE Final    Comment: (NOTE) SARS-CoV-2 target nucleic acids are NOT DETECTED.  The SARS-CoV-2 RNA is generally detectable in upper and lower respiratory specimens during the acute phase of infection. The lowest concentration of SARS-CoV-2 viral copies this assay can detect is 250 copies / mL. A negative result does not preclude SARS-CoV-2 infection and should not be used as the sole basis for treatment or other patient management decisions.  A negative result may occur with improper specimen collection / handling, submission of specimen other than nasopharyngeal swab, presence of viral mutation(s) within the areas targeted by this assay, and inadequate number of viral copies (<250 copies / mL). A negative result must be combined with clinical observations, patient history, and epidemiological information.  Fact Sheet for Patients:   BoilerBrush.com.cy  Fact Sheet for Healthcare Providers: https://pope.com/  This test is not yet approved or  cleared by the [place] FDA and has been authorized for detection and/or diagnosis of SARS-CoV-2 by FDA under an Emergency Use Authorization (EUA).  This EUA  will remain in effect (meaning this test can be used) for the duration of the COVID-19 declaration under Section 564(b)(1) of the Act, 21 U.S.C. section 360bbb-3(b)(1), unless the authorization is terminated or revoked sooner.  Performed at [facility], [address] [place], [place] [address]   Culture, blood (routine x 2)     Status: None (Preliminary result)   Collection Time: 04/07/20 10:02 PM   Specimen: Left Antecubital; Blood  Result Value Ref Range Status   Specimen Description LEFT ANTECUBITAL  Final   Special Requests   Final    BOTTLES DRAWN AEROBIC AND ANAEROBIC Blood Culture adequate volume   Culture   Final    NO GROWTH 4 DAYS Performed at [facility], [address], [place], [place] [address]    Report Status PENDING  Incomplete  Culture, blood (routine x 2)     Status: None (Preliminary result)   Collection Time: 04/07/20 10:07 PM   Specimen: BLOOD LEFT FOREARM  Result Value Ref Range Status   Specimen Description BLOOD LEFT FOREARM  Final   Special Requests   Final    BOTTLES DRAWN AEROBIC AND ANAEROBIC Blood Culture adequate volume   Culture   Final    NO GROWTH 4 DAYS Performed at [facility], [address] [place], [place] [address]    Report Status PENDING  Incomplete    Time coordinating discharge: Over 30 minutes  SIGNED:  Arnetha Courser, MD  [facility] 04/11/2020, 12:02 PM  If 7PM-7AM, please contact night-coverage [email or web address]  This record has been created using Conservation officer, historic buildings. Errors have been sought and corrected,but may not always be located. Such creation errors do not reflect on the standard of care.

## 2020-04-12 LAB — CULTURE, BLOOD (ROUTINE X 2)
Culture: NO GROWTH
Culture: NO GROWTH
Special Requests: ADEQUATE
Special Requests: ADEQUATE

## 2020-04-14 ENCOUNTER — Ambulatory Visit: Payer: Medicare HMO

## 2020-04-19 DIAGNOSIS — R7989 Other specified abnormal findings of blood chemistry: Secondary | ICD-10-CM

## 2020-04-28 NOTE — Progress Notes (Signed)
Patient on schedule for Myelogram 05/04/2020, called and spoke with patient on phone with pre procedure instructions given. Made aware to be here @ 0845, NPO after 0445, holding eliquis/wellbutrin/zoloft with LD 05/01/2020, and driver for discharge post procedure/recovery. Stated understanding.

## 2020-05-04 ENCOUNTER — Ambulatory Visit
Admission: RE | Admit: 2020-05-04 | Discharge: 2020-05-04 | Disposition: A | Discharge status: Home / Self Care | Payer: Medicare HMO | Source: Ambulatory Visit | Attending: Physical Medicine & Rehabilitation | Admitting: Physical Medicine & Rehabilitation

## 2020-05-04 ENCOUNTER — Other Ambulatory Visit: Payer: Self-pay | Admitting: Diagnostic Radiology

## 2020-05-04 ENCOUNTER — Other Ambulatory Visit: Payer: Self-pay

## 2020-05-04 DIAGNOSIS — M5441 Lumbago with sciatica, right side: Secondary | ICD-10-CM | POA: Insufficient documentation

## 2020-05-04 DIAGNOSIS — G8929 Other chronic pain: Secondary | ICD-10-CM | POA: Insufficient documentation

## 2020-05-04 DIAGNOSIS — M5442 Lumbago with sciatica, left side: Secondary | ICD-10-CM

## 2020-05-04 DIAGNOSIS — I708 Atherosclerosis of other arteries: Secondary | ICD-10-CM | POA: Insufficient documentation

## 2020-05-04 LAB — CBC
HCT: 46.5 % (ref 39.0–52.0)
Hemoglobin: 15.5 g/dL (ref 13.0–17.0)
MCH: 31.2 pg (ref 26.0–34.0)
MCHC: 33.3 g/dL (ref 30.0–36.0)
MCV: 93.6 fL (ref 80.0–100.0)
Platelets: 336 10*3/uL (ref 150–400)
RBC: 4.97 MIL/uL (ref 4.22–5.81)
RDW: 15.1 % (ref 11.5–15.5)
WBC: 9.8 10*3/uL (ref 4.0–10.5)
nRBC: 0 % (ref 0.0–0.2)

## 2020-05-04 LAB — PROTIME-INR
INR: 1 (ref 0.8–1.2)
Prothrombin Time: 12.8 seconds (ref 11.4–15.2)

## 2020-05-04 LAB — APTT: aPTT: 34 seconds (ref 24–36)

## 2020-05-04 MED ORDER — IOHEXOL 180 MG/ML  SOLN
20.0000 mL | Freq: Once | INTRAMUSCULAR | Status: AC | PRN
  Administered 2020-05-04: 12 mL via INTRATHECAL

## 2020-05-04 MED ORDER — ACETAMINOPHEN 160 MG/5ML PO SOLN
1000.0000 mg | Freq: Once | ORAL | Status: DC | PRN
  Filled 2020-05-04: qty 40

## 2020-05-04 MED ORDER — LIDOCAINE HCL (PF) 1 % IJ SOLN
5.0000 mL | Freq: Once | INTRAMUSCULAR | Status: AC
  Administered 2020-05-04: 5 mL
  Filled 2020-05-04: qty 5

## 2020-05-04 NOTE — Progress Notes (Signed)
Pt stable after myelogram.VSS.Back stable.D/C instructions given to pt.F/U with his M.D.

## 2020-05-04 NOTE — Progress Notes (Signed)
Spoke with dr Mariah Milling office, message was left regarding when to restart patient's eliquis and I received a return phone call stating dr.morales said to restart eliquis tomorrow

## 2020-06-10 ENCOUNTER — Other Ambulatory Visit: Payer: Self-pay | Admitting: Neurosurgery

## 2020-06-24 ENCOUNTER — Other Ambulatory Visit: Payer: Self-pay

## 2020-06-24 ENCOUNTER — Other Ambulatory Visit
Admission: RE | Admit: 2020-06-24 | Discharge: 2020-06-24 | Disposition: A | Discharge status: Home / Self Care | Payer: Medicare HMO | Source: Ambulatory Visit | Attending: Neurosurgery | Admitting: Neurosurgery

## 2020-06-24 DIAGNOSIS — Z01810 Encounter for preprocedural cardiovascular examination: Secondary | ICD-10-CM | POA: Diagnosis not present

## 2020-06-24 DIAGNOSIS — Z01818 Encounter for other preprocedural examination: Secondary | ICD-10-CM | POA: Insufficient documentation

## 2020-06-24 HISTORY — DX: Depression, unspecified: F32.A

## 2020-06-24 HISTORY — DX: Anxiety disorder, unspecified: F41.9

## 2020-06-24 HISTORY — DX: Unspecified osteoarthritis, unspecified site: M19.90

## 2020-06-24 LAB — BASIC METABOLIC PANEL
Anion gap: 10 (ref 5–15)
BUN: 12 mg/dL (ref 8–23)
CO2: 27 mmol/L (ref 22–32)
Calcium: 9.3 mg/dL (ref 8.9–10.3)
Chloride: 104 mmol/L (ref 98–111)
Creatinine, Ser: 0.74 mg/dL (ref 0.61–1.24)
GFR, Estimated: >60 mL/min (ref >60)
Glucose, Bld: 88 mg/dL (ref 70–99)
Potassium: 4.4 mmol/L (ref 3.5–5.1)
Sodium: 141 mmol/L (ref 135–145)

## 2020-06-24 LAB — CBC
HCT: 45.9 % (ref 39.0–52.0)
Hemoglobin: 14.8 g/dL (ref 13.0–17.0)
MCH: 30.6 pg (ref 26.0–34.0)
MCHC: 32.2 g/dL (ref 30.0–36.0)
MCV: 95 fL (ref 80.0–100.0)
Platelets: 328 10*3/uL (ref 150–400)
RBC: 4.83 MIL/uL (ref 4.22–5.81)
RDW: 14.1 % (ref 11.5–15.5)
WBC: 7.3 10*3/uL (ref 4.0–10.5)
nRBC: 0 % (ref 0.0–0.2)

## 2020-06-24 LAB — URINALYSIS, ROUTINE W REFLEX MICROSCOPIC
Bilirubin Urine: NEGATIVE
Glucose, UA: NEGATIVE mg/dL
Hgb urine dipstick: NEGATIVE
Ketones, ur: NEGATIVE mg/dL
Leukocytes,Ua: NEGATIVE
Nitrite: NEGATIVE
Protein, ur: NEGATIVE mg/dL
Specific Gravity, Urine: 1.009 (ref 1.005–1.030)
pH: 6 (ref 5.0–8.0)

## 2020-06-24 LAB — SURGICAL PCR SCREEN
MRSA, PCR: NEGATIVE
Staphylococcus aureus: NEGATIVE

## 2020-06-24 LAB — PROTIME-INR
INR: 1 (ref 0.8–1.2)
Prothrombin Time: 13.2 seconds (ref 11.4–15.2)

## 2020-06-24 LAB — TYPE AND SCREEN
ABO/RH(D): A NEG
Antibody Screen: NEGATIVE

## 2020-06-24 LAB — APTT: aPTT: 35 seconds (ref 24–36)

## 2020-06-24 NOTE — Patient Instructions (Addendum)
Your procedure is scheduled on: 07/04/20 - Monday Report to the Registration Desk on the 1st floor of the [facility]. To find out your arrival time, please call [phone] between 1PM - 3PM on: 07/01/20 - Friday  REMEMBER: Instructions that are not followed completely may result in serious medical risk, up to and including death; or upon the discretion of your surgeon and anesthesiologist your surgery may need to be rescheduled.  Do not eat food after midnight the night before surgery.  No gum chewing, lozengers or hard candies.  You may however, drink CLEAR liquids up to 2 hours before you are scheduled to arrive for your surgery. Do not drink anything within 2 hours of your scheduled arrival time. Type 1 and Type 2 diabetics should only drink water.   TAKE THESE MEDICATIONS THE MORNING OF SURGERY WITH A SIP OF WATER:  - amiodarone (PACERONE) 200 MG tablet - buPROPion (WELLBUTRIN XL) 300 MG 24 hr tablet - carvedilol (COREG) 25 MG tablet - ENTRESTO 49-51 MG - ezetimibe (ZETIA) 10 MG tablet - sertraline (ZOLOFT) 25 MG tablet  Stop Metformin  2 days prior to surgery. Do not take 04/23, 04/24, and do not take the morning of surgery.  Follow recommendations from Cardiologist, Pulmonologist or PCP regarding stopping Aspirin, Coumadin, Plavix, Eliquis, Pradaxa, or Pletal. Stop taking Eliquis beginning   06/28/20  And resume 1 week post op.  One week prior to surgery: Stop Anti-inflammatories (NSAIDS) such as Advil, Aleve, Ibuprofen, Motrin, Naproxen, Naprosyn and Aspirin based products such as Excedrin, Goodys Powder, BC Powder.  Stop ANY OVER THE COUNTER supplements until after surgery.Melatonin 10 MG CAPS  No Alcohol for 24 hours before or after surgery.  No Smoking including e-cigarettes for 24 hours prior to surgery.  No chewable tobacco products for at least 6 hours prior to surgery.  No nicotine patches on the day of surgery.  Do not use any "recreational" drugs for at  least a week prior to your surgery.  Please be advised that the combination of cocaine and anesthesia may have negative outcomes, up to and including death. If you test positive for cocaine, your surgery will be cancelled.  On the morning of surgery brush your teeth with toothpaste and water, you may rinse your mouth with mouthwash if you wish. Do not swallow any toothpaste or mouthwash.  Do not wear jewelry, make-up, hairpins, clips or nail polish.  Do not wear lotions, powders, or perfumes.   Do not shave body from the neck down 48 hours prior to surgery just in case you cut yourself which could leave a site for infection.  Also, freshly shaved skin may become irritated if using the CHG soap.  Contact lenses, hearing aids and dentures may not be worn into surgery.  Do not bring valuables to the hospital. [facility] is not responsible for any missing/lost belongings or valuables.   Use CHG Soap or wipes as directed on instruction sheet.   Notify your doctor if there is any change in your medical condition (cold, fever, infection).  Wear comfortable clothing (specific to your surgery type) to the hospital.  Plan for stool softeners for home use; pain medications have a tendency to cause constipation. You can also help prevent constipation by eating foods high in fiber such as fruits and vegetables and drinking plenty of fluids as your diet allows.  After surgery, you can help prevent lung complications by doing breathing exercises.  Take deep breaths and cough every 1-2 hours. Your  doctor may order a device called an Incentive Spirometer to help you take deep breaths. When coughing or sneezing, hold a pillow firmly against your incision with both hands. This is called "splinting." Doing this helps protect your incision. It also decreases belly discomfort.  If you are being admitted to the hospital overnight, leave your suitcase in the car. After surgery it may be brought to your  room.  If you are being discharged the day of surgery, you will not be allowed to drive home. You will need a responsible adult (18 years or older) to drive you home and stay with you that night.   If you are taking public transportation, you will need to have a responsible adult (18 years or older) with you. Please confirm with your physician that it is acceptable to use public transportation.   Please call the Pre-admissions Testing Dept. at [phone] if you have any questions about these instructions.  Surgery Visitation Policy:  Patients undergoing a surgery or procedure may have one family member or support person with them as long as that person is not COVID-19 positive or experiencing its symptoms.  That person may remain in the waiting area during the procedure.  Inpatient Visitation:    Visiting hours are 7 a.m. to 8 p.m. Inpatients will be allowed two visitors daily. The visitors may change each day during the patient's stay. No visitors under the age of 39. Any visitor under the age of 52 must be accompanied by an adult. The visitor must pass COVID-19 screenings, use hand sanitizer when entering and exiting the patient's room and wear a mask at all times, including in the patient's room. Patients must also wear a mask when staff or their visitor are in the room. Masking is required regardless of vaccination status.

## 2020-06-30 ENCOUNTER — Other Ambulatory Visit: Payer: Self-pay

## 2020-06-30 ENCOUNTER — Other Ambulatory Visit
Admission: RE | Admit: 2020-06-30 | Discharge: 2020-06-30 | Disposition: A | Discharge status: Home / Self Care | Payer: Medicare HMO | Source: Ambulatory Visit | Attending: Neurosurgery | Admitting: Neurosurgery

## 2020-06-30 ENCOUNTER — Encounter: Payer: Self-pay | Admitting: Neurosurgery

## 2020-06-30 ENCOUNTER — Other Ambulatory Visit: Admission: RE | Admit: 2020-06-30 | Payer: Medicare HMO | Source: Ambulatory Visit

## 2020-06-30 DIAGNOSIS — Z20822 Contact with and (suspected) exposure to covid-19: Secondary | ICD-10-CM | POA: Diagnosis not present

## 2020-06-30 DIAGNOSIS — Z01812 Encounter for preprocedural laboratory examination: Secondary | ICD-10-CM | POA: Diagnosis present

## 2020-06-30 LAB — SARS CORONAVIRUS 2 (TAT 6-24 HRS): SARS Coronavirus 2: NEGATIVE

## 2020-06-30 NOTE — Progress Notes (Signed)
Perioperative Services  Pre-Admission/Anesthesia Testing Clinical Review  Date: 06/30/20  Patient Demographics:  Name: Devin [name] [facility]. 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 Clinical nursing staff has updated patient's PMH/PSHx, current medication list, and drug allergies/intolerances to ensure comprehensive history available to assist in medical decision making as it pertains to the aforementioned surgical procedure and anticipated anesthetic course.   Clinical Discussion:  Devin Councilman. is a 75 y.o. male who is submitted for pre-surgical anesthesia review and clearance prior to him undergoing the above procedure. Patient is a Former Games developer. Pertinent PMH includes: CAD (s/p CABG x 1), MI, atrial fibrillation, ischemic cardiomyopathy, AAA, aortic atherosclerosis, CHF, PFO (s/p repair), HTN, T2DM, OSAH (requires nocturnal PAP therapy), OA, spinal stenosis, depression, anxiety.  Patient is followed by cardiology Yetta Barre, MD). He was last seen in the cardiology clinic on 01/06/2020; notes reviewed.  At the time of his clinic visit, patient doing well overall from a cardiovascular perspective.  He complained of generalized weakness and balance issues. Patient denied chest pain, shortness of breath, PND, orthopnea, palpitations, significant peripheral edema, and presyncope/syncope.  Patient with a significant cardiovascular history as follows:   Paroxysmal atrial fibrillation diagnosed in 2017. CHA2DS2-VASc Score = 6 (age x 2, CHF, HTN, prior MI, T2DM).  He is chronically anticoagulated  using apixaban; compliant with therapy with no evidence of GI bleeding.   Diagnostic ultrasound imaging of the patient's aorta performed on 05/28/2017 revealed aneurysmal dilatation of the distal abdominal aorta measuring 3.0 cm.   Myocardial perfusion imaging study performed on 02/24/2018 revealed mild global hypokinesis, a reduced LVEF of 47%, and no evidence of stress-induced myocardial ischemia or arrhythmia.   Diagnostic left heart catheterization performed on 05/21/2019 revealed 99% stenosis of the distal LM to proximal LAD, 40% stenosis of the proximal LAD to mid LAD, 35% stenosis ostial LCx to proximal LCx, and 30% stenosis of proximal RCA.  Recommendations were for transfer for consideration of CABG procedure.   Patient underwent single-vessel CABG (LIMA-LAD) and repair of a incidentally noted PFO on 05/25/2019.  Postoperative TTE performed on 05/23/2019 revealed a mildly decreased left ventricular systolic function with an EF of 45-50%.  Transesophageal echocardiogram performed on 05/25/2019 revealed severe anteroseptal wall hypokinesis; LVEF 35-40%.  Postoperatively, patient developed a left-sided pleural effusion requiring drainage by VIR.  Discharged to SNF on 06/12/2019.   Last TTE performed on 12/14/2019 revealed mildly decreased left ventricular systolic function with an EF of 40-45%.  There was no evidence of valvular insufficiency noted.   Recurrent atrial fibrillation in the setting of patient being off of his antiarrythmic (amiodarone). Underwent DCCV procedure on 12/15/2019 restoring normal sinus rhythm at a rate of 65 bpm.  Patient was asked to restart amiodarone, however at 01/06/2020 appointment, patient was not taking this medication.  Patient on GDMT for his HTN and HLD diagnoses.  Blood pressure was un controlled at 180/88 on currently prescribed ARB/ARNI, beta blocker, and diuretic therapies. Patient had been taken off of his CCB therapy at some point following his  CABG due to hyponatremia.  Patient is on a  statin + ezitimibe for his HLD. T2DM well controlled on currently prescribed regimen; Hgb A1c 6.0% when last checked on 03/17/2020. Functional capacity, < 4 METS due to generalized weakness, balance issues, and overall debility.  Patient once again asked to restart antiarrhythmic medication.  No other changes were made to his medication regimen.  Patient to follow-up with outpatient cardiology in 3 months or sooner if needed.  Patient is scheduled for a laminectomy on 07/04/2020 with Dr. Lucy Chris.  Given patient's past medical history significant for cardiovascular diagnoses, presurgical cardiac clearance was sought by the performing surgeon's office and PAT team. Per cardiology, "this patient is optimized for surgery and may proceed with the planned procedural course with a LOW risk stratification". This patient is on daily anticoagulation therapy. He has been instructed on recommendations for holding his apixaban for 5 days prior to his procedure with plans to restart 7 days postoperatively, or as soon as postoperative bleeding risk felt to be minimized by his attending surgeon. The patient has been instructed that his last dose of his anticoagulant will be on 06/28/2020.  Patient denies previous perioperative complications with anesthesia in the past. In review of the available records, it is noted that patient underwent a general anesthetic course here (ASA IV) in 12/2019 without documented complications.   Vitals with BMI 05/04/2020 05/04/2020 05/04/2020  Height - - -  Weight - - -  BMI - - -  Systolic 142 131 409  Diastolic 74 75 96  Pulse 81 84 79    Providers/Specialists:   NOTE: Primary physician provider listed below. Patient may have been seen by APP or partner within same practice.   PROVIDER ROLE / SPECIALTY LAST Devin Masker, MD  Neurosurgery  05/24/2020  Devin Housekeeper, MD  Primary Care Provider  05/09/2020  Devin Dare. Roseanne Reno,  MD  Cardiology  01/06/2020   Allergies:  Ace inhibitors, Morphine, and Yellow jacket venom [bee venom]  Current Home Medications:   No current facility-administered medications for this encounter.   Marland Kitchen acetaminophen (TYLENOL) 500 MG tablet  . amiodarone (PACERONE) 200 MG tablet  . apixaban (ELIQUIS) 5 MG TABS tablet  . atorvastatin (LIPITOR) 80 MG tablet  . buPROPion (WELLBUTRIN XL) 300 MG 24 hr tablet  . carvedilol (COREG) 25 MG tablet  . Emollient (CERAVE EX)  . ENTRESTO 49-51 MG  . EPINEPHrine 0.3 mg/0.3 mL IJ SOAJ injection  . ezetimibe (ZETIA) 10 MG tablet  . gabapentin (NEURONTIN) 300 MG capsule  . magnesium hydroxide (MILK OF MAGNESIA) 400 MG/5ML suspension  . Melatonin 10 MG CAPS  . metFORMIN (GLUCOPHAGE) 500 MG tablet  . nitroGLYCERIN (NITROSTAT) 0.4 MG SL tablet  . sertraline (ZOLOFT) 25 MG tablet  . sildenafil (VIAGRA) 50 MG tablet  . vitamin B-12 1000 MCG tablet  . furosemide (LASIX) 20 MG tablet   . sodium chloride flush (NS) 0.9 % injection 3 mL   History:   Past Medical History:  Diagnosis Date  . AAA (abdominal aortic aneurysm) (HCC)   . Anxiety   . Aortic atherosclerosis (HCC)   . Arthritis   . Atrial fibrillation (HCC)   . CAD (coronary artery disease)   . CHF (congestive heart failure) (HCC)   . Current use of long term anticoagulation    Apixaban  . Depression   . Diabetes mellitus without complication (HCC)   . 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 Hypertension   . Ischemic cardiomyopathy   . MI, old   .  PFO (patent foramen ovale) 05/2019   s/p repair  . Sleep apnea   . Spinal stenosis of lumbar region    Past Surgical History:  Procedure Laterality Date  . APPENDECTOMY    . BREAST SURGERY     benign mass  . CARDIOVERSION Right 12/15/2019   Procedure: CARDIOVERSION;  Surgeon: Laurier Nancy, MD;  Location: [facility] ORS;  Service: Cardiovascular;  Laterality: Right;  . COLONOSCOPY    . CORONARY ANGIOPLASTY WITH STENT PLACEMENT    .  CORONARY ARTERY BYPASS GRAFT  05/2019   LIMA-LAD  . LEFT HEART CATH AND CORS/GRAFTS ANGIOGRAPHY N/A 05/21/2019   Procedure: LEFT HEART CATH AND CORONARY ANGIOGRAPHY;  Surgeon: Laurier Nancy, MD;  Location: [facility] INVASIVE CV LAB;  Service: Cardiovascular;  Laterality: N/A;  . REPLACEMENT TOTAL KNEE BILATERAL     Family History  Problem Relation Age of Onset  . Osteoarthritis Mother   . Other Father 27       MVA   Social History   Tobacco Use  . Smoking status: Former Games developer  . Smokeless tobacco: Never Used  . Tobacco comment: Quit over 40 years ago  Vaping Use  . Vaping Use: Never used  Substance Use Topics  . Alcohol use: Not Currently    Comment: social  . Drug use: Never    Pertinent Clinical Results:  LABS: Labs reviewed: Acceptable for surgery.  No visits with results within 3 Day(s) from this visit.  Latest known visit with results is:  Hospital Outpatient Visit on 06/24/2020  Component Date Value Ref Range Status  . aPTT 06/24/2020 35  24 - 36 seconds Final   Performed at [facility], [address] [place] [place], [place] [address]  . Sodium 06/24/2020 141  135 - 145 mmol/L Final  . Potassium 06/24/2020 4.4  3.5 - 5.1 mmol/L Final  . Chloride 06/24/2020 104  98 - 111 mmol/L Final  . CO2 06/24/2020 27  22 - 32 mmol/L Final  . Glucose, Bld 06/24/2020 88  70 - 99 mg/dL Final   Glucose reference range applies only to samples taken after fasting for at least 8 hours.  . BUN 06/24/2020 12  8 - 23 mg/dL Final  . Creatinine, Ser 06/24/2020 0.74  0.61 - 1.24 mg/dL Final  . Calcium 38/46/6599 9.3  8.9 - 10.3 mg/dL Final  . GFR, Estimated 06/24/2020 >60  >60 mL/min Final   Comment: (NOTE) Calculated using the CKD-EPI Creatinine Equation (2021)   . Anion gap 06/24/2020 10  5 - 15 Final   Performed at [facility], [address] [place] [place], [place] [address]  . WBC 06/24/2020 7.3  4.0 - 10.5 K/uL Final  . RBC 06/24/2020 4.83  4.22 - 5.81 MIL/uL Final  .  Hemoglobin 06/24/2020 14.8  13.0 - 17.0 g/dL Final  . HCT 77/93/9030 45.9  39.0 - 52.0 % Final  . MCV 06/24/2020 95.0  80.0 - 100.0 fL Final  . MCH 06/24/2020 30.6  26.0 - 34.0 pg Final  . MCHC 06/24/2020 32.2  30.0 - 36.0 g/dL Final  . RDW 12/02/3005 14.1  11.5 - 15.5 % Final  . Platelets 06/24/2020 328  150 - 400 K/uL Final  . nRBC 06/24/2020 0.0  0.0 - 0.2 % Final   Performed at [facility], [address] [place], [place] [address]  . Prothrombin Time 06/24/2020 13.2  11.4 - 15.2 seconds Final  . INR 06/24/2020 1.0  0.8 - 1.2 Final   Comment: (  NOTE) INR goal varies based on device and disease states. Performed at [facility], [address] [place], [place] [address]   . ABO/RH(D) 06/24/2020 A NEG   Final  . Antibody Screen 06/24/2020 NEG   Final  . Sample Expiration 06/24/2020 07/08/2020,2359   Final  . Extend sample reason 06/24/2020    Final                   Value:NO TRANSFUSIONS OR PREGNANCY IN THE PAST 3 MONTHS Performed at [facility], [address] [place], [place] [address]   . MRSA, PCR 06/24/2020 NEGATIVE  NEGATIVE Final  . Staphylococcus aureus 06/24/2020 NEGATIVE  NEGATIVE Final   Comment: (NOTE) The Xpert SA Assay (FDA approved for NASAL specimens in patients 77 years of age and older), is one component of a comprehensive surveillance program. It is not intended to diagnose infection nor to guide or monitor treatment. Performed at [facility], [address] [place], [place] [address]   . Color, Urine 06/24/2020 YELLOW* YELLOW Final  . APPearance 06/24/2020 CLEAR* CLEAR Final  . Specific Gravity, Urine 06/24/2020 1.009  1.005 - 1.030 Final  . pH 06/24/2020 6.0  5.0 - 8.0 Final  . Glucose, UA 06/24/2020 NEGATIVE  NEGATIVE mg/dL Final  . Hgb urine dipstick 06/24/2020 NEGATIVE  NEGATIVE Final  . Bilirubin Urine 06/24/2020 NEGATIVE  NEGATIVE Final  . Ketones, ur 06/24/2020 NEGATIVE  NEGATIVE mg/dL Final  .  Protein, ur 91/47/829504/15/2022 NEGATIVE  NEGATIVE mg/dL Final  . Nitrite 62/13/086504/15/2022 NEGATIVE  NEGATIVE Final  . Glori LuisLeukocytes,Ua 06/24/2020 NEGATIVE  NEGATIVE Final   Performed at [facility], [address] [place], [place] [address]    ECG: Date: 06/24/2020 Time ECG obtained: 1102 AM Rate: 63 bpm Rhythm: normal sinus Axis (leads I and aVF): Normal Intervals: PR 186 ms. QRS 88 ms. QTc 444 ms. ST segment and T wave changes: No evidence of acute ST segment elevation or depression Comparison: Similar to previous tracing obtained on 04/07/2020   IMAGING / PROCEDURES: CT LUMBAR SPINE WITH CONTRAST performed on 05/04/2020 1. Severe canal stenosis with effacement of subarticular recesses at L4-L5.  2. Moderate left foraminal stenosis at this level with facet spurring abutting the exiting L4 nerve root. 3. Marked lower lumbar facet hypertrophy.  ECHOCARDIOGRAM performed on 12/14/2019 1. Left ventricular ejection fraction, by estimation, is 40 to 45%.  2. The left ventricle has mildly decreased function.  3. The left ventricle demonstrates global hypokinesis.  4. Left ventricular diastolic parameters are consistent with Grade I diastolic dysfunction (impaired relaxation).  5. Right ventricular systolic function is normal. The right ventricular size is normal.  6. Left atrial size was mildly dilated.  7. Right atrial size was mildly dilated.  8. The mitral valve is normal in structure. Mild mitral valve regurgitation. No evidence of mitral stenosis.  9. The aortic valve is normal in structure. Aortic valve regurgitation is not visualized. No aortic stenosis is present.  10. The inferior vena cava is normal in size with greater than 50% respiratory variability, suggesting right atrial pressure of 3 mmHg.  TRANSESOPHAGEAL ECHOCARDIOGRAM performed on 05/25/2019 1. LV: Severely hypokinetic anterior, anteroseptal, and septal wall motion unchangedfrom pre-bypass exam. LVEF remains 35-40%.   2. RV: Normal RV systolic function.  3. MV: MR remains mild in severity, unchanged from pre-bypass exam.  4. The previously described PFO is no longer present.  5. There is no evidence of aortic dissection, pleural effusion, or pericardial effusion.  LEFT HEART CATHETERIZATION AND CORONARY ANGIOGRAPHY performed on 05/21/2019 1. 35% stenosis ostial LCx proximal LCx 2. 40% stenosis proximal LAD to mid LAD 3. 99% stenosis distal LM to proximal LAD 4. 30% stenosis proximal RCA 5. Recommendations: CABG  LEXISCAN performed on 02/24/2018 1. Abnormal exercise nuclear stress test 2. Mildly reduced left ventricular systolic function; LVEF 47% 3. Mild global hypokinesis 4. No ischemia or infarct  ULTRASOUND AORTA DUPLEX LIMITED performed on 05/28/2017 1. Maximal diameter of the abdominal aorta occurs in the distal aorta measuring 3.0 cm.  2. Recommend follow up by ultrasound in 3 years.  Impression and Plan:  Devin [name]. has been referred for pre-anesthesia review and clearance prior to him undergoing the planned anesthetic and procedural courses. Available labs, pertinent testing, and imaging results were personally reviewed by me. This patient has been appropriately cleared by cardiology with an overall LOW risk of significant perioperative cardiovascular complications.  Based on clinical review performed today (06/30/20), barring any significant acute changes in the patient's overall condition, it is anticipated that he will be able to proceed with the planned surgical intervention. Any acute changes in clinical condition may necessitate his procedure being postponed and/or cancelled. Patient will meet with anesthesia team (MD and/or CRNA) on this day of his procedure for preoperative evaluation/assessment.   Pre-surgical instructions were reviewed with the patient during his PAT appointment and questions were fielded by PAT clinical staff. Patient was advised that if any questions  or concerns arise prior to his procedure then he should return a call to PAT and/or his surgeon's office to discuss.  Quentin Mulling, MSN, APRN, FNP-C, CEN [facility]  Peri-operative Services Nurse Practitioner Phone: [phone] 06/30/20 11:02 AM  NOTE: This note has been prepared using Dragon dictation software. Despite my best ability to proofread, there is always the potential that unintentional transcriptional errors may still occur from this process.

## 2020-07-04 ENCOUNTER — Ambulatory Visit
Admission: RE | Admit: 2020-07-04 | Discharge: 2020-07-04 | Disposition: A | Discharge status: Home / Self Care | Payer: Medicare HMO | Attending: Neurosurgery | Admitting: Neurosurgery

## 2020-07-04 ENCOUNTER — Encounter
Admission: RE | Disposition: A | Discharge status: Home / Self Care | Payer: Self-pay | Source: Home / Self Care | Attending: Neurosurgery

## 2020-07-04 ENCOUNTER — Encounter: Payer: Self-pay | Admitting: Neurosurgery

## 2020-07-04 ENCOUNTER — Ambulatory Visit: Payer: Medicare HMO | Admitting: Urgent Care

## 2020-07-04 ENCOUNTER — Ambulatory Visit: Payer: Medicare HMO

## 2020-07-04 DIAGNOSIS — Z87891 Personal history of nicotine dependence: Secondary | ICD-10-CM | POA: Diagnosis not present

## 2020-07-04 DIAGNOSIS — Z885 Allergy status to narcotic agent status: Secondary | ICD-10-CM | POA: Diagnosis not present

## 2020-07-04 DIAGNOSIS — Z888 Allergy status to other drugs, medicaments and biological substances status: Secondary | ICD-10-CM | POA: Diagnosis not present

## 2020-07-04 DIAGNOSIS — E1140 Type 2 diabetes mellitus with diabetic neuropathy, unspecified: Secondary | ICD-10-CM | POA: Diagnosis not present

## 2020-07-04 DIAGNOSIS — Z951 Presence of aortocoronary bypass graft: Secondary | ICD-10-CM | POA: Diagnosis not present

## 2020-07-04 DIAGNOSIS — M5416 Radiculopathy, lumbar region: Secondary | ICD-10-CM | POA: Insufficient documentation

## 2020-07-04 DIAGNOSIS — Z96653 Presence of artificial knee joint, bilateral: Secondary | ICD-10-CM | POA: Diagnosis not present

## 2020-07-04 DIAGNOSIS — M21372 Foot drop, left foot: Secondary | ICD-10-CM | POA: Insufficient documentation

## 2020-07-04 DIAGNOSIS — Z419 Encounter for procedure for purposes other than remedying health state, unspecified: Secondary | ICD-10-CM

## 2020-07-04 DIAGNOSIS — Z7984 Long term (current) use of oral hypoglycemic drugs: Secondary | ICD-10-CM | POA: Diagnosis not present

## 2020-07-04 DIAGNOSIS — M48062 Spinal stenosis, lumbar region with neurogenic claudication: Secondary | ICD-10-CM | POA: Insufficient documentation

## 2020-07-04 DIAGNOSIS — Z955 Presence of coronary angioplasty implant and graft: Secondary | ICD-10-CM | POA: Insufficient documentation

## 2020-07-04 DIAGNOSIS — Z7901 Long term (current) use of anticoagulants: Secondary | ICD-10-CM | POA: Diagnosis not present

## 2020-07-04 DIAGNOSIS — Z79899 Other long term (current) drug therapy: Secondary | ICD-10-CM | POA: Diagnosis not present

## 2020-07-04 DIAGNOSIS — Z91030 Bee allergy status: Secondary | ICD-10-CM | POA: Insufficient documentation

## 2020-07-04 HISTORY — PX: LUMBAR LAMINECTOMY/DECOMPRESSION MICRODISCECTOMY: SHX5026

## 2020-07-04 HISTORY — DX: Abdominal aortic aneurysm, without rupture, unspecified: I71.40

## 2020-07-04 HISTORY — DX: Spinal stenosis, lumbar region without neurogenic claudication: M48.061

## 2020-07-04 HISTORY — DX: Long term (current) use of anticoagulants: Z79.01

## 2020-07-04 HISTORY — DX: Unspecified atrial fibrillation: I48.91

## 2020-07-04 HISTORY — DX: Ischemic cardiomyopathy: I25.5

## 2020-07-04 HISTORY — DX: Abdominal aortic aneurysm, without rupture: I71.4

## 2020-07-04 HISTORY — DX: Atherosclerosis of aorta: I70.0

## 2020-07-04 LAB — POCT I-STAT, CHEM 8
BUN: 16 mg/dL (ref 8–23)
Calcium, Ion: 1.19 mmol/L (ref 1.15–1.40)
Chloride: 107 mmol/L (ref 98–111)
Creatinine, Ser: 0.8 mg/dL (ref 0.61–1.24)
Glucose, Bld: 111 mg/dL — ABNORMAL HIGH (ref 70–99)
HCT: 45 % (ref 39.0–52.0)
Hemoglobin: 15.3 g/dL (ref 13.0–17.0)
Potassium: 4.7 mmol/L (ref 3.5–5.1)
Sodium: 142 mmol/L (ref 135–145)
TCO2: 24 mmol/L (ref 22–32)

## 2020-07-04 LAB — GLUCOSE, CAPILLARY
Glucose-Capillary: 113 mg/dL — ABNORMAL HIGH (ref 70–99)
Glucose-Capillary: 132 mg/dL — ABNORMAL HIGH (ref 70–99)

## 2020-07-04 LAB — ABO/RH: ABO/RH(D): A NEG

## 2020-07-04 SURGERY — LUMBAR LAMINECTOMY/DECOMPRESSION MICRODISCECTOMY 1 LEVEL
Anesthesia: General

## 2020-07-04 MED ORDER — FENTANYL CITRATE (PF) 100 MCG/2ML IJ SOLN
INTRAMUSCULAR | Status: AC
  Filled 2020-07-04: qty 2

## 2020-07-04 MED ORDER — OXYCODONE HCL 5 MG/5ML PO SOLN: 5.0000 mg | Freq: Once | ORAL | Status: AC | PRN

## 2020-07-04 MED ORDER — EPHEDRINE 5 MG/ML INJ
INTRAVENOUS | Status: AC
  Filled 2020-07-04: qty 10

## 2020-07-04 MED ORDER — DEXMEDETOMIDINE (PRECEDEX) IN NS 20 MCG/5ML (4 MCG/ML) IV SYRINGE
PREFILLED_SYRINGE | INTRAVENOUS | Status: AC
  Filled 2020-07-04: qty 5

## 2020-07-04 MED ORDER — MIDAZOLAM HCL 2 MG/2ML IJ SOLN
INTRAMUSCULAR | Status: AC
  Filled 2020-07-04: qty 2

## 2020-07-04 MED ORDER — CHLORHEXIDINE GLUCONATE 0.12 % MT SOLN: 15.0000 mL | Freq: Once | OROMUCOSAL | Status: AC

## 2020-07-04 MED ORDER — ACETAMINOPHEN 10 MG/ML IV SOLN
INTRAVENOUS | Status: DC | PRN
  Administered 2020-07-04: 1000 mg via INTRAVENOUS

## 2020-07-04 MED ORDER — ORAL CARE MOUTH RINSE: 15.0000 mL | Freq: Once | OROMUCOSAL | Status: AC

## 2020-07-04 MED ORDER — SUCCINYLCHOLINE CHLORIDE 200 MG/10ML IV SOSY
PREFILLED_SYRINGE | INTRAVENOUS | Status: AC
  Filled 2020-07-04: qty 10

## 2020-07-04 MED ORDER — DEXAMETHASONE SODIUM PHOSPHATE 10 MG/ML IJ SOLN
INTRAMUSCULAR | Status: AC
  Filled 2020-07-04: qty 1

## 2020-07-04 MED ORDER — FENTANYL CITRATE (PF) 100 MCG/2ML IJ SOLN
INTRAMUSCULAR | Status: DC | PRN
  Administered 2020-07-04 (×2): 50 ug via INTRAVENOUS

## 2020-07-04 MED ORDER — PHENYLEPHRINE HCL (PRESSORS) 10 MG/ML IV SOLN
INTRAVENOUS | Status: AC
  Filled 2020-07-04: qty 1

## 2020-07-04 MED ORDER — OXYCODONE HCL 5 MG PO TABS
5.0000 mg | ORAL_TABLET | Freq: Once | ORAL | Status: AC | PRN
Start: 2020-07-04 — End: 2020-07-04
  Administered 2020-07-04: 5 mg via ORAL

## 2020-07-04 MED ORDER — PHENYLEPHRINE HCL (PRESSORS) 10 MG/ML IV SOLN
INTRAVENOUS | Status: DC | PRN
  Administered 2020-07-04 (×3): 100 ug via INTRAVENOUS

## 2020-07-04 MED ORDER — GLYCOPYRROLATE 0.2 MG/ML IJ SOLN
INTRAMUSCULAR | Status: AC
  Filled 2020-07-04: qty 1

## 2020-07-04 MED ORDER — VASOPRESSIN 20 UNIT/ML IV SOLN
INTRAVENOUS | Status: AC
  Filled 2020-07-04: qty 1

## 2020-07-04 MED ORDER — ACETAMINOPHEN 10 MG/ML IV SOLN
INTRAVENOUS | Status: AC
  Filled 2020-07-04: qty 100

## 2020-07-04 MED ORDER — BUPIVACAINE-EPINEPHRINE (PF) 0.5% -1:200000 IJ SOLN
INTRAMUSCULAR | Status: AC
  Filled 2020-07-04: qty 30

## 2020-07-04 MED ORDER — LIDOCAINE HCL (CARDIAC) PF 100 MG/5ML IV SOSY
PREFILLED_SYRINGE | INTRAVENOUS | Status: DC | PRN
  Administered 2020-07-04: 80 mg via INTRAVENOUS

## 2020-07-04 MED ORDER — SODIUM CHLORIDE 0.9 % IV SOLN: INTRAVENOUS | Status: DC

## 2020-07-04 MED ORDER — SODIUM CHLORIDE 0.9 % IV SOLN
INTRAVENOUS | Status: DC | PRN
  Administered 2020-07-04: 20 ug/min via INTRAVENOUS

## 2020-07-04 MED ORDER — THROMBIN (RECOMBINANT) 5000 UNITS EX SOLR
CUTANEOUS | Status: AC
  Filled 2020-07-04: qty 5000

## 2020-07-04 MED ORDER — EPHEDRINE SULFATE 50 MG/ML IJ SOLN
INTRAMUSCULAR | Status: DC | PRN
  Administered 2020-07-04 (×2): 10 mg via INTRAVENOUS
  Administered 2020-07-04 (×2): 5 mg via INTRAVENOUS

## 2020-07-04 MED ORDER — METHYLPREDNISOLONE ACETATE 40 MG/ML IJ SUSP
INTRAMUSCULAR | Status: AC
  Filled 2020-07-04: qty 1

## 2020-07-04 MED ORDER — GLYCOPYRROLATE 0.2 MG/ML IJ SOLN
INTRAMUSCULAR | Status: DC | PRN
  Administered 2020-07-04: .2 mg via INTRAVENOUS

## 2020-07-04 MED ORDER — CHLORHEXIDINE GLUCONATE 0.12 % MT SOLN
OROMUCOSAL | Status: AC
  Administered 2020-07-04: 15 mL via OROMUCOSAL
  Filled 2020-07-04: qty 15

## 2020-07-04 MED ORDER — METHOCARBAMOL 500 MG PO TABS: 500.0000 mg | ORAL_TABLET | Freq: Three times a day (TID) | ORAL | 0 refills | Status: DC | PRN

## 2020-07-04 MED ORDER — OXYCODONE HCL 5 MG PO TABS
ORAL_TABLET | ORAL | Status: AC
  Filled 2020-07-04: qty 1

## 2020-07-04 MED ORDER — VASOPRESSIN 20 UNIT/ML IV SOLN
INTRAVENOUS | Status: DC | PRN
  Administered 2020-07-04: 2 [IU] via INTRAVENOUS

## 2020-07-04 MED ORDER — LIDOCAINE HCL (PF) 2 % IJ SOLN
INTRAMUSCULAR | Status: AC
  Filled 2020-07-04: qty 15

## 2020-07-04 MED ORDER — ONDANSETRON HCL 4 MG/2ML IJ SOLN
INTRAMUSCULAR | Status: AC
  Filled 2020-07-04: qty 2

## 2020-07-04 MED ORDER — ONDANSETRON HCL 4 MG/2ML IJ SOLN
INTRAMUSCULAR | Status: DC | PRN
  Administered 2020-07-04 (×2): 4 mg via INTRAVENOUS

## 2020-07-04 MED ORDER — FENTANYL CITRATE (PF) 100 MCG/2ML IJ SOLN: 25.0000 ug | INTRAMUSCULAR | Status: DC | PRN

## 2020-07-04 MED ORDER — FAMOTIDINE 20 MG PO TABS
ORAL_TABLET | ORAL | Status: AC
  Administered 2020-07-04: 20 mg via ORAL
  Filled 2020-07-04: qty 1

## 2020-07-04 MED ORDER — METHYLPREDNISOLONE ACETATE 40 MG/ML IJ SUSP
INTRAMUSCULAR | Status: DC | PRN
  Administered 2020-07-04: 40 mg

## 2020-07-04 MED ORDER — REMIFENTANIL HCL 1 MG IV SOLR
INTRAVENOUS | Status: DC | PRN
  Administered 2020-07-04: .1 ug/kg/min via INTRAVENOUS

## 2020-07-04 MED ORDER — ACETAMINOPHEN 10 MG/ML IV SOLN: 1000.0000 mg | Freq: Once | INTRAVENOUS | Status: DC | PRN

## 2020-07-04 MED ORDER — ONDANSETRON HCL 4 MG/2ML IJ SOLN: 4.0000 mg | Freq: Once | INTRAMUSCULAR | Status: DC | PRN

## 2020-07-04 MED ORDER — DEXMEDETOMIDINE (PRECEDEX) IN NS 20 MCG/5ML (4 MCG/ML) IV SYRINGE
PREFILLED_SYRINGE | INTRAVENOUS | Status: DC | PRN
  Administered 2020-07-04: 4 ug via INTRAVENOUS

## 2020-07-04 MED ORDER — CEFAZOLIN SODIUM-DEXTROSE 2-4 GM/100ML-% IV SOLN
2.0000 g | Freq: Once | INTRAVENOUS | Status: AC
  Administered 2020-07-04: 2 g via INTRAVENOUS

## 2020-07-04 MED ORDER — DEXAMETHASONE SODIUM PHOSPHATE 10 MG/ML IJ SOLN
INTRAMUSCULAR | Status: DC | PRN
  Administered 2020-07-04: 10 mg via INTRAVENOUS

## 2020-07-04 MED ORDER — CEFAZOLIN SODIUM-DEXTROSE 2-4 GM/100ML-% IV SOLN
INTRAVENOUS | Status: AC
  Filled 2020-07-04: qty 100

## 2020-07-04 MED ORDER — PROPOFOL 10 MG/ML IV BOLUS
INTRAVENOUS | Status: DC | PRN
  Administered 2020-07-04: 20 mg via INTRAVENOUS
  Administered 2020-07-04: 100 mg via INTRAVENOUS

## 2020-07-04 MED ORDER — SUCCINYLCHOLINE CHLORIDE 20 MG/ML IJ SOLN
INTRAMUSCULAR | Status: DC | PRN
  Administered 2020-07-04: 100 mg via INTRAVENOUS

## 2020-07-04 MED ORDER — FAMOTIDINE 20 MG PO TABS: 20.0000 mg | ORAL_TABLET | Freq: Once | ORAL | Status: AC

## 2020-07-04 MED ORDER — BUPIVACAINE-EPINEPHRINE (PF) 0.5% -1:200000 IJ SOLN
INTRAMUSCULAR | Status: DC | PRN
  Administered 2020-07-04: 10 mL

## 2020-07-04 MED ORDER — THROMBIN 5000 UNITS EX SOLR
CUTANEOUS | Status: DC | PRN
  Administered 2020-07-04: 5000 [IU] via TOPICAL

## 2020-07-04 MED ORDER — TRAMADOL HCL 50 MG PO TABS: 50.0000 mg | ORAL_TABLET | Freq: Four times a day (QID) | ORAL | 0 refills | Status: AC | PRN

## 2020-07-04 MED ORDER — REMIFENTANIL HCL 1 MG IV SOLR
INTRAVENOUS | Status: AC
  Filled 2020-07-04: qty 1000

## 2020-07-04 SURGICAL SUPPLY — 61 items
ADH SKN CLS APL DERMABOND .7 (GAUZE/BANDAGES/DRESSINGS) ×1
AGENT HMST MTR 8 SURGIFLO (HEMOSTASIS) ×1
APL PRP STRL LF DISP 70% ISPRP (MISCELLANEOUS) ×2
APL SRG 60D 8 XTD TIP BNDBL (TIP)
BUR NEURO DRILL SOFT 3.0X3.8M (BURR) ×2 IMPLANT
CANISTER SUCT 1200ML W/VALVE (MISCELLANEOUS) ×4 IMPLANT
CHLORAPREP W/TINT 26 (MISCELLANEOUS) ×4 IMPLANT
COUNTER NEEDLE 20/40 LG (NEEDLE) ×2 IMPLANT
COVER LIGHT HANDLE STERIS (MISCELLANEOUS) ×4 IMPLANT
COVER WAND RF STERILE (DRAPES) ×2 IMPLANT
CUP MEDICINE 2OZ PLAST GRAD ST (MISCELLANEOUS) ×2 IMPLANT
DERMABOND ADVANCED (GAUZE/BANDAGES/DRESSINGS) ×1
DERMABOND ADVANCED .7 DNX12 (GAUZE/BANDAGES/DRESSINGS) ×1 IMPLANT
DRAPE C-ARM 42X72 X-RAY (DRAPES) ×4 IMPLANT
DRAPE LAPAROTOMY 100X77 ABD (DRAPES) ×2 IMPLANT
DRAPE MICROSCOPE SPINE 48X150 (DRAPES) IMPLANT
DRAPE SURG 17X11 SM STRL (DRAPES) ×2 IMPLANT
DRSG TEGADERM 4X4.75 (GAUZE/BANDAGES/DRESSINGS) IMPLANT
DRSG TELFA 4X3 1S NADH ST (GAUZE/BANDAGES/DRESSINGS) IMPLANT
DURASEAL APPLICATOR TIP (TIP) IMPLANT
DURASEAL SPINE SEALANT 3ML (MISCELLANEOUS) IMPLANT
ELECT CAUTERY BLADE TIP 2.5 (TIP) ×2
ELECT EZSTD 165MM 6.5IN (MISCELLANEOUS) ×2
ELECT REM PT RETURN 9FT ADLT (ELECTROSURGICAL) ×2
ELECTRODE CAUTERY BLDE TIP 2.5 (TIP) ×1 IMPLANT
ELECTRODE EZSTD 165MM 6.5IN (MISCELLANEOUS) ×1 IMPLANT
ELECTRODE REM PT RTRN 9FT ADLT (ELECTROSURGICAL) ×1 IMPLANT
GAUZE SPONGE 4X4 12PLY STRL (GAUZE/BANDAGES/DRESSINGS) ×2 IMPLANT
GLOVE SRG 8 PF TXTR STRL LF DI (GLOVE) ×1 IMPLANT
GLOVE SURG SYN 7.0 (GLOVE) IMPLANT
GLOVE SURG SYN 8.0 (GLOVE) ×4 IMPLANT
GLOVE SURG UNDER POLY LF SZ7 (GLOVE) IMPLANT
GLOVE SURG UNDER POLY LF SZ8 (GLOVE) ×2
GOWN STRL REUS W/ TWL XL LVL3 (GOWN DISPOSABLE) ×2 IMPLANT
GOWN STRL REUS W/TWL XL LVL3 (GOWN DISPOSABLE) ×4
GRADUATE 1200CC STRL 31836 (MISCELLANEOUS) ×2 IMPLANT
GRAFT DURAGEN MATRIX 1WX1L (Tissue) IMPLANT
IV CATH ANGIO 12GX3 LT BLUE (NEEDLE) ×2 IMPLANT
KIT TURNOVER KIT A (KITS) ×2 IMPLANT
KIT WILSON FRAME (KITS) ×2 IMPLANT
KNIFE BAYONET SHORT DISCETOMY (MISCELLANEOUS) IMPLANT
MANIFOLD NEPTUNE II (INSTRUMENTS) ×2 IMPLANT
MARKER SKIN DUAL TIP RULER LAB (MISCELLANEOUS) ×4 IMPLANT
NDL SAFETY ECLIPSE 18X1.5 (NEEDLE) ×1 IMPLANT
NEEDLE HYPO 18GX1.5 SHARP (NEEDLE) ×2
NEEDLE HYPO 22GX1.5 SAFETY (NEEDLE) ×2 IMPLANT
NS IRRIG 1000ML POUR BTL (IV SOLUTION) ×2 IMPLANT
PACK LAMINECTOMY NEURO (CUSTOM PROCEDURE TRAY) ×2 IMPLANT
PAD ARMBOARD 7.5X6 YLW CONV (MISCELLANEOUS) ×2 IMPLANT
SPOGE SURGIFLO 8M (HEMOSTASIS) ×1
SPONGE SURGIFLO 8M (HEMOSTASIS) ×1 IMPLANT
STAPLER SKIN PROX 35W (STAPLE) IMPLANT
SUT NURALON 4 0 TR CR/8 (SUTURE) IMPLANT
SUT POLYSORB 2-0 5X18 GS-10 (SUTURE) ×2 IMPLANT
SUT VIC AB 0 CT1 18XCR BRD 8 (SUTURE) ×1 IMPLANT
SUT VIC AB 0 CT1 8-18 (SUTURE) ×2
SYR 10ML LL (SYRINGE) ×4 IMPLANT
SYR 30ML LL (SYRINGE) ×2 IMPLANT
SYR 3ML LL SCALE MARK (SYRINGE) ×2 IMPLANT
TOWEL OR 17X26 4PK STRL BLUE (TOWEL DISPOSABLE) ×8 IMPLANT
TUBING CONNECTING 10 (TUBING) ×2 IMPLANT

## 2020-07-04 NOTE — Op Note (Signed)
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  He had failed to control pain with conservative measures. MRI revealed soft tissue causing severe stenosis at L4/5. L4/5 laminectomy and L4/5 decompression was discussed to relieve symptoms. The risks of surgery were explained to include hematoma, infection, damage to nerve roots, CSF leak, weakness, numbness, pain, need for future surgery, heart attack, and stroke. He elected to proceed with surgery for symptom relief.    Procedure The patient was brought to the OR after informed consent was obtained. He was given general anesthesia and intubated by the anesthesia service. Vascular access lines were placed.The patient was then placed prone on a Wilson frame ensuring all pressure points were padded. A time-out was performed per protocol.  X-ray then confirmed the planned incision at L4-5.   The patient was sterilely prepped and draped. The incision was instilled with local anesthetic with epinephrine.  The skin was opened sharply and the dissection taken to the fascia. This was incised and cautery was used to dissect the subperiosteal plane to expose the spinous processes and lamina of L4/5. Dissection was continued laterally in order to expose the medial facets at the L4 and L5 levels bilaterally. Retractors were inserted and hemostasis was achieved. X-ray was used to confirm location at the L5 lamina.   Next, a drill was used to remove the  L4 lamina centrally. The underlying ligament was freed and removed with combination of rongeurs. The decompression was taken caudal to the superior border of L5. Next attention was turned to the lateral stenosis where a large right synovial cyst was encountered.  The dura was compressed to the left side.. A medial facetectomy was performed with foraminotomy at the L4/5 level on the right exposing a synovial cyst.  This was then removed with rongeurs first decompressing the soft center and then working to remove the wall from the medial facet as well as the dura using blunt dissection.  After this,  the dura and nerve roots exiting were seen to be free at this point.    Once the dura appeared free from all the bone edges and all soft tissue compression was removed, hemostasis was obtained.  The wound was irrigated and Depo-Medrol was placed along the dura.  The muscle was then closed using 0 vicryl followed by the fascia with 0 vicryl.  Next, multiple subcutaneous and dermal layers were closed with 2-0 vicryl until the epidermis was well approximated. The skin was closed with Dermabond. A dressing was applied.   The patient was returned to supine position and extubated by the anesthesia service. The patient was then taken to the PACU for post-operative care where he was moving extremities symmetrically.   ESTIMATED BLOOD LOSS:   20 cc   SPECIMENS None   IMPLANT None     I performed the case in its entirety with assistance of PA, [facility]   Lucy Chris, MD [phone]

## 2020-07-04 NOTE — Progress Notes (Signed)
PHARMACY -  BRIEF ANTIBIOTIC NOTE   Pharmacy has received consult(s) for Cefazolin from an OR provider.  The patient's profile has been reviewed for ht/wt/allergies/indication/available labs.    One time order(s) placed for Cefazolin 2 gm once (preop) based on pt wt of 83.9 kg.  Further antibiotics/pharmacy consults should be ordered by admitting physician if indicated.                       Otelia Sergeant, PharmD, [facility] 07/04/2020 6:21 AM

## 2020-07-04 NOTE — Discharge Instructions (Addendum)
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 Increase protein intake to promote wound healing.  Recommended activity: no heavy lifting, pushing, pulling for 6 weeks. You should walk multiple times per day  Special Instructions  No straining, no heavy lifting > 10lbs x 4 weeks.  Keep incision area clean and dry. May shower in 2 days. No baths or pools for 6 weeks.  Please remove dressing tomorrow, no need to apply a bandage afterwards  You have no sutures to remove, the skin is closed with adhesive  Please take pain medications as directed. Take a stool softener if on pain medications.  you may take ibuprofen or Tylenol as needed.  Please restart your Eliquis  5 days after surgery, on 4/30   Please Report any of the following: Nausea or Vomiting, Temperature is greater than 101.71F (38.1C) degrees, Dizziness, Abdominal Pain, Difficulty Breathing or Shortness of Breath, Inability to Eat, drink Fluids, or Take medications, Bleeding, swelling, or drainage from surgical incision sites, New numbness or weakness, and Bowel or bladder dysfunction to the neurosurgeon on call at [phone]  Additional Follow up appointments Please follow up with Dr Adriana Simas in [place] clinic as scheduled in 2-3 weeks  AMBULATORY SURGERY  DISCHARGE INSTRUCTIONS   1) The drugs that you were given will stay in your system until tomorrow so for the next 24 hours you should not:  A) Drive an automobile B) Make any legal decisions C) Drink any alcoholic beverage   2) You may resume regular meals tomorrow.  Today it is better to start with liquids and gradually work up to solid foods.  You may eat anything you prefer, but it is better to start with liquids, then soup and crackers, and  gradually work up to solid foods.   3) Please notify your doctor immediately if you have any unusual bleeding, trouble breathing, redness and pain at the surgery site, drainage, fever, or pain not relieved by medication.    Additional Instructions:         Please contact your physician with any problems or Same Day Surgery at [phone], Monday through Friday 6 am to 4 pm, or [place] at [facility] number at [phone].

## 2020-07-04 NOTE — Anesthesia Preprocedure Evaluation (Addendum)
Anesthesia Evaluation  Patient identified by MRN, date of birth, ID band Patient awake    Reviewed: Allergy & Precautions, NPO status , Patient's Chart, lab work & pertinent test results  History of Anesthesia Complications Negative for: history of anesthetic complications  Airway Mallampati: II  TM Distance: >3 FB Neck ROM: Full    Dental no notable dental hx. (+) Teeth Intact   Pulmonary shortness of breath and with exertion, sleep apnea , neg COPD, Patient abstained from smoking.Not current smoker, former smoker,  OSA, s/p oropharyngeal muscle stimulator implant. Patient has been doing well and has lost 40 pounds recently as well .   Pulmonary exam normal breath sounds clear to auscultation       Cardiovascular Exercise Tolerance: Poor METShypertension, + CAD, + Past MI, + CABG and +CHF  (-) dysrhythmias  Rhythm:Regular Rate:Normal - Systolic murmurs Echocardiogram with new onset of low EF at 40 to 45%, prior EF was normal in March 2021 during cardiac catheterization. Thought to be secondary to A. fib. Patient was on amiodarone by his cardiologist. S/P CABG 2021  Hx afib now in SR s/p DCCV   Neuro/Psych PSYCHIATRIC DISORDERS Anxiety Depression negative neurological ROS     GI/Hepatic GERD  ,(+)     (-) substance abuse  ,   Endo/Other  diabetes  Renal/GU negative Renal ROS     Musculoskeletal  (+) Arthritis ,   Abdominal   Peds  Hematology   Anesthesia Other Findings Past Medical History: No date: AAA (abdominal aortic aneurysm) (HCC) No date: Anxiety No date: Aortic atherosclerosis (HCC) No date: Arthritis No date: Atrial fibrillation (HCC) No date: CAD (coronary artery disease) No date: CHF (congestive heart failure) (HCC) No date: Current use of long term anticoagulation     Comment:  Apixaban No date: Depression No date: Diabetes mellitus without complication (HCC) 05/28/2019: Hx of CABG      Comment:  LIMA-LAD No date: Hypertension No date: Ischemic cardiomyopathy 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, old 05/2019: PFO (patent foramen ovale)     Comment:  s/p repair No date: Sleep apnea No date: Spinal stenosis of lumbar region  Reproductive/Obstetrics                            Anesthesia Physical Anesthesia Plan  ASA: III  Anesthesia Plan: General   Post-op Pain Management:    Induction: Intravenous  PONV Risk Score and Plan: 3 and Ondansetron, Dexamethasone and Treatment may vary due to age or medical condition  Airway Management Planned: Oral ETT  Additional Equipment: None  Intra-op Plan:   Post-operative Plan: Extubation in OR  Informed Consent: I have reviewed the patients History and Physical, chart, labs and discussed the procedure including the risks, benefits and alternatives for the proposed anesthesia with the patient or authorized representative who has indicated his/her understanding and acceptance.     Dental advisory given  Plan Discussed with: CRNA and Surgeon  Anesthesia Plan Comments: (Discussed risks of anesthesia with patient, including PONV, sore throat, lip/dental damage. Rare risks discussed as well, such as cardiorespiratory and neurological sequelae. Patient understands.)        Anesthesia Quick Evaluation

## 2020-07-04 NOTE — Interval H&P Note (Signed)
History and Physical Interval Note:  07/04/2020 6:31 AM  Devin [name].  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  The various methods of treatment have been discussed with the patient and family. After consideration of risks, benefits and other options for treatment, the patient has consented to  Procedure(s): L4-5 LAMINECTOMY (N/A) as a surgical intervention.  The patient's history has been reviewed, patient examined, no change in status, stable for surgery.  I have reviewed the patient's chart and labs.  Questions were answered to the patient's satisfaction.     Devin [name]

## 2020-07-04 NOTE — Anesthesia Postprocedure Evaluation (Signed)
Anesthesia Post Note  Patient: Devin [name].  Procedure(s) Performed: L4-5 LAMINECTOMY (N/A )  Patient location during evaluation: PACU Anesthesia Type: General Level of consciousness: awake and alert Pain management: pain level controlled Vital Signs Assessment: post-procedure vital signs reviewed and stable Respiratory status: spontaneous breathing, nonlabored ventilation, respiratory function stable and patient connected to nasal cannula oxygen Cardiovascular status: blood pressure returned to baseline and stable Postop Assessment: no apparent nausea or vomiting Anesthetic complications: no   No complications documented.   Last Vitals:  Vitals:   07/04/20 1021 07/04/20 1030  BP: 133/62 121/60  Pulse: 65 62  Resp: 18 16  Temp: (!) 36.2 C   SpO2: 98% 100%    Last Pain:  Vitals:   07/04/20 1030  TempSrc:   PainSc: 2                  Corinda Gubler

## 2020-07-04 NOTE — Anesthesia Procedure Notes (Signed)
Procedure Name: Intubation Performed by: Fletcher-Harrison, Zymire Turnbo, CRNA Pre-anesthesia Checklist: Patient identified, Emergency Drugs available, Suction available and Patient being monitored Patient Re-evaluated:Patient Re-evaluated prior to induction Oxygen Delivery Method: Circle system utilized Preoxygenation: Pre-oxygenation with 100% oxygen Induction Type: IV induction Ventilation: Mask ventilation without difficulty Laryngoscope Size: McGraph and 3 Grade View: Grade I Tube type: Oral Tube size: 7.0 mm Number of attempts: 1 Airway Equipment and Method: Stylet and Oral airway Placement Confirmation: ETT inserted through vocal cords under direct vision,  positive ETCO2,  breath sounds checked- equal and bilateral and CO2 detector Secured at: 22 cm Tube secured with: Tape Dental Injury: Teeth and Oropharynx as per pre-operative assessment        

## 2020-07-04 NOTE — H&P (Signed)
Brazos Sandoval Burandt Montez Hageman. is an 75 y.o. male.   Chief Complaint: Leg pain and numbness HPI: Mr. Cuppett is here for evaluation of ongoing symptoms in the legs consisting of pain and heaviness. He does feel like sometimes he gets some weakness and it has given out on him. He feels like this has been going on for the past 5 to 6 months. He did go to physical therapy and he does feel like this helped some but he still has pain that goes across bilateral buttocks area into the hips. He does go more to the right leg. This has gotten better in the past but in the past few days he got worse again. He denies any numbness in his lower extremities. He does comment on the weakness in his left foot where he has been diagnosed with a foot drop in the past year. He denies any pain or numbness there. He did go for CT myelogram that showed severe stenosis at L4/5.   He has been seen by neurology in the past and has a diagnosis of neuropathy. He is currently on gabapentin. He did have an EMG in 2017 that diagnosed this.  Given the stenosis, he is here for decompression    Past Medical History:  Diagnosis Date  . AAA (abdominal aortic aneurysm) (HCC)   . Anxiety   . Aortic atherosclerosis (HCC)   . Arthritis   . Atrial fibrillation (HCC)   . CAD (coronary artery disease)   . CHF (congestive heart failure) (HCC)   . Current use of long term anticoagulation    Apixaban  . Depression   . Diabetes mellitus without complication (HCC)   . 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 Hypertension   . Ischemic cardiomyopathy   . MI, old   . PFO (patent foramen ovale) 05/2019   s/p repair  . Sleep apnea   . Spinal stenosis of lumbar region     Past Surgical History:  Procedure Laterality Date  . APPENDECTOMY    . BREAST SURGERY     benign mass  . CARDIOVERSION Right 12/15/2019   Procedure: CARDIOVERSION;  Surgeon: Laurier Nancy, MD;  Location: [facility] ORS;  Service: Cardiovascular;  Laterality: Right;  .  COLONOSCOPY    . CORONARY ANGIOPLASTY WITH STENT PLACEMENT    . CORONARY ARTERY BYPASS GRAFT  05/2019   LIMA-LAD  . LEFT HEART CATH AND CORS/GRAFTS ANGIOGRAPHY N/A 05/21/2019   Procedure: LEFT HEART CATH AND CORONARY ANGIOGRAPHY;  Surgeon: Laurier Nancy, MD;  Location: [facility] INVASIVE CV LAB;  Service: Cardiovascular;  Laterality: N/A;  . REPLACEMENT TOTAL KNEE BILATERAL      Family History  Problem Relation Age of Onset  . Osteoarthritis Mother   . Other Father 25       MVA   Social History:  reports that he has quit smoking. He has never used smokeless tobacco. He reports previous alcohol use. He reports that he does not use drugs.  Allergies:  Allergies  Allergen Reactions  . Ace Inhibitors Swelling  . Morphine Swelling  . 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    Medications Prior to Admission  Medication Sig Dispense Refill  . acetaminophen (TYLENOL) 500 MG tablet Take 1,000 mg by mouth every 6 (six) hours as needed for moderate pain or headache.    Marland Kitchen amiodarone (PACERONE) 200 MG tablet Take 200 mg by mouth daily.    Marland Kitchen apixaban (ELIQUIS) 5 MG TABS tablet Take 5 mg by  mouth 2 (two) times daily.     Marland Kitchen atorvastatin (LIPITOR) 80 MG tablet Take 1 tablet (80 mg total) by mouth daily. 30 tablet 11  . buPROPion (WELLBUTRIN XL) 300 MG 24 hr tablet Take 300 mg by mouth daily.     . carvedilol (COREG) 25 MG tablet Take 1 tablet (25 mg total) by mouth 2 (two) times daily. 60 tablet 11  . Emollient (CERAVE EX) Apply 1 application topically daily as needed (dry skin).    Marland Kitchen ENTRESTO 49-51 MG Take 1 tablet by mouth 2 (two) times daily.    Marland Kitchen EPINEPHrine 0.3 mg/0.3 mL IJ SOAJ injection Inject 0.3 mg into the muscle as needed for anaphylaxis.    Marland Kitchen ezetimibe (ZETIA) 10 MG tablet Take 10 mg by mouth daily.    Marland Kitchen gabapentin (NEURONTIN) 300 MG capsule Take 300 mg by mouth 2 (two) times daily.    . magnesium hydroxide (MILK OF MAGNESIA) 400 MG/5ML suspension Take 30 mLs by mouth daily as needed  for mild constipation. 355 mL 0  . Melatonin 10 MG CAPS Take 30 mg by mouth at bedtime.    . metFORMIN (GLUCOPHAGE) 500 MG tablet Take 1,000 mg by mouth 2 (two) times daily.    . nitroGLYCERIN (NITROSTAT) 0.4 MG SL tablet Place 0.4 mg under the tongue every 5 (five) minutes as needed for chest pain.    Marland Kitchen sertraline (ZOLOFT) 25 MG tablet Take 25 mg by mouth daily.    . sildenafil (VIAGRA) 50 MG tablet Take 50 mg by mouth daily as needed for erectile dysfunction.    . vitamin B-12 1000 MCG tablet Take 1 tablet (1,000 mcg total) by mouth daily. 90 tablet 1  . furosemide (LASIX) 20 MG tablet Take 1 tablet (20 mg total) by mouth daily. (Patient not taking: Reported on 06/13/2020) 30 tablet 11    No results found for this or any previous visit (from the past 48 hour(s)). No results found.  Review of Systems General ROS: Negative Psychological ROS: Negative Ophthalmic ROS: Negative ENT ROS: Negative Hematological and Lymphatic ROS: Negative  Endocrine ROS: Negative Respiratory ROS: Negative Cardiovascular ROS: Negative Gastrointestinal ROS: Negative Genito-Urinary ROS: Negative Musculoskeletal ROS: Negative for back pain Neurological ROS: Positive for leg pain, weakness Dermatological ROS: Negative  Blood pressure (!) 141/75, pulse 60, resp. rate 18, height 5\' 11"  (1.803 m), weight 83.9 kg, SpO2 100 %. Physical Exam  General appearance: Alert, cooperative, in no acute distress Head: Normocephalic, atraumatic Eyes: Normal, EOM intact Oropharynx: Wearing facemask Back: No tenderness to palpation CV: Regular rate and rhythm Pulm: Clear to auscultation Ext: No edema in LE bilaterally  Neurologic exam:  Mental status: alertness: alert, affect: normal Speech: fluent and clear Motor:strength symmetric 5/5 in bilateral lower extremities including hip flexion, knee flexion, knee extension, plantar flexion. He is 5-5 in right dorsiflexion but 1 out of 5 in left dorsiflexion Sensory: intact to  light touch in bilateral lower extremities Gait: Not tested, in wheelchair   Imaging: CT myelogram lumbar spine: There is a normal lordotic curvature with normal alignment. There is moderate degenerative disease noted throughout. At L4-5, there is hypertrophied ligamentum flavum which causes severe central stenosis. There is no other obvious significant stenosis noted.   Assessment/Plan L4/5 Laminectomy  , MD 07/04/2020, 6:29 AM

## 2020-07-04 NOTE — Transfer of Care (Signed)
Immediate Anesthesia Transfer of Care Note  Patient: Devin [name].  Procedure(s) Performed: L4-5 LAMINECTOMY (N/A )  Patient Location: PACU  Anesthesia Type:General  Level of Consciousness: awake, drowsy and patient cooperative  Airway & Oxygen Therapy: Patient Spontanous Breathing  Post-op Assessment: Report given to RN and Post -op Vital signs reviewed and stable  Post vital signs: Reviewed and stable  Last Vitals:  Vitals Value Taken Time  BP 137/70 07/04/20 0930  Temp 36.4 C 07/04/20 0928  Pulse 65 07/04/20 0932  Resp 11 07/04/20 0931  SpO2 99 % 07/04/20 0932  Vitals shown include unvalidated device data.  Last Pain:  Vitals:   07/04/20 0928  TempSrc:   PainSc: 0-No pain         Complications: No complications documented.

## 2020-07-11 ENCOUNTER — Telehealth: Payer: Self-pay | Admitting: Podiatry

## 2020-07-11 NOTE — Telephone Encounter (Signed)
Pt left message at 121 wanting a call back to schedule an appt for heel lift and to see what could be done about his hammer toe as well.   Upon checking pt was scheduled to see Dr Lilian Kapur and I did call to confirm and asked pt if he was diabetic and he said he was type 2 and asked if he knew how much of a discrepancy in the leg length and he said he was not sure but he does not think it was much that he is just stumbling over it.  I just wanted to confirm it was not a large discrepancy so that we were able to handle it in our office. He said thank you.

## 2020-07-13 ENCOUNTER — Other Ambulatory Visit: Payer: Self-pay

## 2020-07-13 ENCOUNTER — Ambulatory Visit: Payer: Medicare HMO | Admitting: Podiatry

## 2020-07-13 ENCOUNTER — Encounter: Payer: Self-pay | Admitting: Podiatry

## 2020-07-13 DIAGNOSIS — Z9189 Other specified personal risk factors, not elsewhere classified: Secondary | ICD-10-CM | POA: Insufficient documentation

## 2020-07-13 DIAGNOSIS — E7800 Pure hypercholesterolemia, unspecified: Secondary | ICD-10-CM | POA: Insufficient documentation

## 2020-07-13 DIAGNOSIS — Z9861 Coronary angioplasty status: Secondary | ICD-10-CM | POA: Insufficient documentation

## 2020-07-13 DIAGNOSIS — M21372 Foot drop, left foot: Secondary | ICD-10-CM

## 2020-07-13 DIAGNOSIS — M21371 Foot drop, right foot: Secondary | ICD-10-CM

## 2020-07-13 DIAGNOSIS — I252 Old myocardial infarction: Secondary | ICD-10-CM | POA: Insufficient documentation

## 2020-07-13 DIAGNOSIS — I259 Chronic ischemic heart disease, unspecified: Secondary | ICD-10-CM | POA: Insufficient documentation

## 2020-07-15 NOTE — Progress Notes (Signed)
  Subjective:  Patient ID: Devin [name]., male    DOB: 1946-02-14,  MRN: [id number]  Chief Complaint  Patient presents with  . Diabetes    Diabetic foot exam, A1C  5.8    75 y.o. male presents with the above complaint. History confirmed with patient. Had recent back surgery. His PCP referred him to [place] because he is a fall risk and keeps tripping, he feels like his feet don't lift up well and his toes catch  Objective:  Physical Exam: warm, good capillary refill, no trophic changes or ulcerative lesions and normal DP and PT pulses. He has 2/5 strength in the anterior compartment, 5/5 in lateral and posterior compartment bilateral Assessment:   1. Foot drop, bilateral      Plan:  Patient was evaluated and treated and all questions answered.  Discussed with him he would benefit from physical therapy and drop foot braces. We will schedule him for fitting. He sees his back surgeon next week and they will determine if he needs therapy for this. If so will add drop foot regimen for him, otherwise will see how he does with braces, can do separate referral in future if necessary  Return if symptoms worsen or fail to improve.

## 2020-07-19 ENCOUNTER — Telehealth: Payer: Self-pay | Admitting: Podiatry

## 2020-07-19 NOTE — Telephone Encounter (Signed)
Left message for pt to call to discuss foot drop braces per Dr Lilian Kapur.

## 2020-07-27 ENCOUNTER — Ambulatory Visit: Payer: Medicare HMO | Admitting: Podiatry

## 2020-08-05 ENCOUNTER — Other Ambulatory Visit: Payer: Medicare HMO

## 2020-08-17 ENCOUNTER — Other Ambulatory Visit: Payer: Medicare HMO

## 2020-09-08 DIAGNOSIS — T148XXD Other injury of unspecified body region, subsequent encounter: Secondary | ICD-10-CM | POA: Insufficient documentation

## 2020-09-08 DIAGNOSIS — Z9889 Other specified postprocedural states: Secondary | ICD-10-CM | POA: Insufficient documentation

## 2020-09-21 ENCOUNTER — Ambulatory Visit: Payer: Medicare HMO

## 2020-09-21 ENCOUNTER — Other Ambulatory Visit: Payer: Self-pay

## 2020-09-21 DIAGNOSIS — M21371 Foot drop, right foot: Secondary | ICD-10-CM

## 2020-09-21 DIAGNOSIS — M21372 Foot drop, left foot: Secondary | ICD-10-CM

## 2020-11-08 ENCOUNTER — Telehealth: Payer: Self-pay | Admitting: Podiatry

## 2020-11-08 NOTE — Telephone Encounter (Signed)
Pt called [place] office stating he received a call and Dr Lilian Kapur had a question for him. He did not write down the name of the person who had left the message.  I returned call and told pt I was not the one that called but would send a message to Dr Lilian Kapur to see if he needed anything and give him a call back tomorrow. He said thank you

## 2020-11-08 NOTE — Telephone Encounter (Signed)
From my last  note it looks like we had talked about braces for drop foot, did he ever get them?

## 2020-11-10 ENCOUNTER — Telehealth: Payer: Self-pay | Admitting: Podiatry

## 2020-11-10 NOTE — Telephone Encounter (Signed)
Left message for pt that I believe that was an old message he received and that as long as he is not having any issues with the braces or feels he needed to see the doctor. If he feels needs to see [place] to please call and schedule an appt.

## 2020-11-22 NOTE — Progress Notes (Signed)
Patient comes in today to pick up bilat prefab AFO braces bilat

## 2020-11-24 DIAGNOSIS — M1991 Primary osteoarthritis, unspecified site: Secondary | ICD-10-CM | POA: Insufficient documentation

## 2020-12-14 ENCOUNTER — Telehealth: Payer: Self-pay | Admitting: Podiatry

## 2020-12-14 NOTE — Telephone Encounter (Signed)
Pt left message stating he has had the braces for a while and was not sure if he has an appt to follow up or not.  I returned call and left message we have pt scheduled to see Dr Lilian Kapur on 10.12 @ 115 in [place] office.

## 2020-12-21 ENCOUNTER — Ambulatory Visit (INDEPENDENT_AMBULATORY_CARE_PROVIDER_SITE_OTHER): Payer: Medicare HMO | Admitting: Podiatry

## 2020-12-21 ENCOUNTER — Other Ambulatory Visit: Payer: Self-pay

## 2020-12-21 DIAGNOSIS — M21371 Foot drop, right foot: Secondary | ICD-10-CM

## 2020-12-21 DIAGNOSIS — M21372 Foot drop, left foot: Secondary | ICD-10-CM

## 2020-12-22 NOTE — Progress Notes (Signed)
  Subjective:  Patient ID: Devin [name]., male    DOB: 16-Feb-1946,  MRN: [id number]  Chief Complaint  Patient presents with   foot drop       follow up on braces/pt having pain with them/    75 y.o. male returns for follow-up with the above complaint. History confirmed with patient.  He has had issues with his braces, they are not comfortable and tend to hurt his knee and are difficult to walk and to the point where it inside.  Feels like his strength overall is gotten much better with physical therapy over the last several months, he feels that the braces are limiting him  Objective:  Physical Exam: warm, good capillary refill, no trophic changes or ulcerative lesions and normal DP and PT pulses. He has 2/5 strength in the anterior compartment, 5/5 in lateral and posterior compartment bilateral Assessment:   1. Foot drop, bilateral      Plan:  Patient was evaluated and treated and all questions answered.  Still has significant strength deficits but [facility]'s physical therapy has put him in a much better position.  He feels more comfortable walking without the braces.  I advised him to try this for now and continue his therapy before trying a different type of brace such as a Moore balance brace.  He will return to see me as needed  Return if symptoms worsen or fail to improve, for call me if you have any issues still in 2-3 months to see if any other bracing or shoe changes.

## 2021-02-20 ENCOUNTER — Other Ambulatory Visit: Payer: Self-pay

## 2021-02-20 ENCOUNTER — Ambulatory Visit
Admission: EM | Admit: 2021-02-20 | Discharge: 2021-02-20 | Disposition: A | Discharge status: Home / Self Care | Payer: Medicare HMO

## 2021-05-24 ENCOUNTER — Ambulatory Visit
Admission: RE | Admit: 2021-05-24 | Discharge: 2021-05-24 | Disposition: A | Discharge status: Home / Self Care | Payer: Medicare HMO | Source: Ambulatory Visit | Attending: Family Medicine | Admitting: Family Medicine

## 2021-05-24 ENCOUNTER — Ambulatory Visit
Admission: RE | Admit: 2021-05-24 | Discharge: 2021-05-24 | Disposition: A | Discharge status: Home / Self Care | Payer: Medicare HMO | Attending: Family Medicine | Admitting: Family Medicine

## 2021-05-24 ENCOUNTER — Other Ambulatory Visit: Payer: Self-pay | Admitting: Family Medicine

## 2021-05-24 DIAGNOSIS — M25551 Pain in right hip: Secondary | ICD-10-CM | POA: Insufficient documentation

## 2021-05-27 ENCOUNTER — Ambulatory Visit
Admission: EM | Admit: 2021-05-27 | Discharge: 2021-05-27 | Disposition: A | Discharge status: Home / Self Care | Payer: Medicare HMO

## 2021-05-27 ENCOUNTER — Other Ambulatory Visit: Payer: Self-pay

## 2021-05-27 DIAGNOSIS — R0981 Nasal congestion: Secondary | ICD-10-CM | POA: Diagnosis not present

## 2021-05-27 DIAGNOSIS — J302 Other seasonal allergic rhinitis: Secondary | ICD-10-CM

## 2021-05-27 DIAGNOSIS — R051 Acute cough: Secondary | ICD-10-CM

## 2021-05-27 MED ORDER — LORATADINE 10 MG PO TABS: 10.0000 mg | ORAL_TABLET | Freq: Every day | ORAL | 0 refills | Status: DC | PRN

## 2021-05-27 MED ORDER — IPRATROPIUM BROMIDE 0.06 % NA SOLN
2.0000 | Freq: Four times a day (QID) | NASAL | 0 refills | Status: DC
Start: 2021-05-27 — End: 2022-03-15

## 2021-05-27 NOTE — Discharge Instructions (Addendum)
-  Your symptoms may be consistent with allergies versus a viral illness. ?- I have sent an antihistamine to the pharmacy to help dry things up.  Have also sent a nasal spray which is different from the one you are using. ?- Can try Coricidin HBP over-the-counter for cough. ?- Should be feeling better in a couple days but if you start to have a fever, worsening cough or breathing trouble you need to be seen again. ?

## 2021-05-27 NOTE — ED Provider Notes (Signed)
?[facility] ? ? ? ?CSN: [id number] ?Arrival date & time: 05/27/21  1015 ? ? ?  ? ?History   ?Chief Complaint ?Chief Complaint  ?Patient presents with  ? Cough  ? Nasal Congestion  ? ? ?HPI ?Devin Bailey Riecke Montez Hageman. is a 76 y.o. male presenting for cough, congestion and mild sore throat for the past 4 days.  Denies fever or fatigue.  No body aches.  No shortness of breath, nausea/vomiting.  Patient reports sputum is clear.  He has taken 2 home COVID test which were both negative.  Denies any sick contacts or known COVID exposure.  He has been taking benzonatate and using Flonase without improvement in symptoms.  Reports he has been told he has had allergies before.  Unsure if his current symptoms are due to allergies or something else. ? ?HPI ? ?Past Medical History:  ?Diagnosis Date  ? AAA (abdominal aortic aneurysm)   ? Anxiety   ? Aortic atherosclerosis (HCC)   ? Arthritis   ? Atrial fibrillation (HCC)   ? CAD (coronary artery disease)   ? CHF (congestive heart failure) (HCC)   ? Current use of long term anticoagulation   ? Apixaban  ? Depression   ? Diabetes mellitus without complication (HCC)   ? 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 LIMA-LAD  ? Hypertension   ? Ischemic cardiomyopathy   ? MI, old   ? PFO (patent foramen ovale) 05/2019  ? s/p repair  ? Sleep apnea   ? Spinal stenosis of lumbar region   ? ? ?Patient Active Problem List  ? Diagnosis Date Noted  ? Localized, primary osteoarthritis 11/24/2020  ? Wound healing, delayed 09/08/2020  ? History of lumbar surgery 09/08/2020  ? Chronic ischemic heart disease 07/13/2020  ? History of MI (myocardial infarction) 07/13/2020  ? History of PTCA 1 07/13/2020  ? Other personal history presenting hazards to health 07/13/2020  ? Pure hypercholesterolemia 07/13/2020  ? Elevated lactic acid level 04/19/2020  ? Generalized weakness 04/09/2020  ? Fall at home 04/09/2020  ? Gastroenteritis 04/09/2020  ? AMS (altered mental status) 04/08/2020  ? Hypoxia 02/01/2020  ? Atrial  fibrillation status post cardioversion [facility]) 12/22/2019  ? Congestive heart failure (CHF) (HCC) 12/13/2019  ? Hyponatremia 12/06/2019  ? Acute postoperative anemia due to expected blood loss 05/28/2019  ? Hyperlipidemia 05/28/2019  ? Ischemic cardiomyopathy 05/28/2019  ? 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 Unstable angina (HCC) 05/24/2019  ? Chest pain 05/21/2019  ? Ischemic chest pain (HCC) 05/16/2019  ? CAD (coronary artery disease) 05/16/2019  ? HTN (hypertension) 05/16/2019  ? Depression 05/16/2019  ? GERD (gastroesophageal reflux disease) 05/16/2019  ? Verruca plantaris 01/23/2018  ? Obstructive sleep apnea syndrome 01/20/2018  ? Major depressive disorder, single episode, moderate (HCC) 07/05/2016  ? 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 Controlled type 2 diabetes mellitus without complication, without long-term current use of insulin (HCC) 12/19/2014  ? Renal cyst, left 11/11/2013  ? AAA (abdominal aortic aneurysm) 10/01/2012  ? Osteoarthritis of knee 01/29/2012  ? Obesity 08/15/2011  ? ? ?Past Surgical History:  ?Procedure Laterality Date  ? APPENDECTOMY    ? BREAST SURGERY    ? benign mass  ? CARDIOVERSION Right 12/15/2019  ? Procedure: CARDIOVERSION;  Surgeon: Laurier Nancy, MD;  Location: [facility] ORS;  Service: Cardiovascular;  Laterality: Right;  ? COLONOSCOPY    ? CORONARY ANGIOPLASTY WITH STENT PLACEMENT    ? CORONARY ARTERY BYPASS GRAFT  05/2019  ?  LIMA-LAD  ? LEFT HEART CATH AND CORS/GRAFTS ANGIOGRAPHY N/A 05/21/2019  ? Procedure: LEFT HEART CATH AND CORONARY ANGIOGRAPHY;  Surgeon: Laurier NancyKhan, Shaukat A, MD;  Location: [facility];  Service: Cardiovascular;  Laterality: N/A;  ? LUMBAR LAMINECTOMY/DECOMPRESSION MICRODISCECTOMY N/A 07/04/2020  ? Procedure: L4-5 LAMINECTOMY;  Surgeon: Lucy Chrisook, Steven, MD;  Location: [facility] ORS;  Service: Neurosurgery;  Laterality: N/A;  ? REPLACEMENT TOTAL KNEE BILATERAL    ? ? ? ? ? ?Home Medications   ? ?Prior to Admission medications    ?Medication Sig Start Date End Date Taking? Authorizing Provider  ?benzonatate (TESSALON) 200 MG capsule Take by mouth. 03/11/21  Yes [provider]  ?carvedilol (COREG) 12.5 MG tablet Take 12.5 mg by mouth 2 (two) times daily. 03/09/21  Yes [provider]  ?Continuous Blood Gluc Receiver (FREESTYLE LIBRE 14 DAY READER) DEVI Use 1 Device as directed 10/24/20  Yes [provider]  ?Emollient (CERAVE EX) Apply 1 application topically daily as needed (dry skin).   Yes [provider]  ?ENTRESTO 97-103 MG Take 1 tablet by mouth 2 (two) times daily. 05/22/21  Yes [provider]  ?esomeprazole (NEXIUM) 40 MG packet Take by mouth.   Yes [provider]  ?ezetimibe (ZETIA) 10 MG tablet Take 10 mg by mouth daily. 04/06/19  Yes [provider]  ?fluticasone (FLONASE) 50 MCG/ACT nasal spray SPRAY 2 SPRAYS INTO EACH NOSTRIL EVERY DAY 03/09/21  Yes [provider]  ?gabapentin (NEURONTIN) 300 MG capsule Take 1 capsule by mouth 2 (two) times daily. 03/20/21  Yes [provider]  ?glucose blood (PRECISION QID TEST) test strip Check fasting sugar once daily E11.9 11/29/20  Yes [provider]  ?ipratropium (ATROVENT) 0.06 % nasal spray Place 2 sprays into both nostrils 4 (four) times daily. 05/27/21  Yes Shirlee LatchEaves, Nicki Furlan B, PA-C  ?Lancets (ONETOUCH DELICA PLUS LANCET33G) MISC Apply topically daily. 02/19/21  Yes [provider]  ?loratadine (CLARITIN) 10 MG tablet Take 1 tablet (10 mg total) by mouth daily as needed for allergies. 05/27/21 06/26/21 Yes Shirlee LatchEaves, Kemani Demarais B, PA-C  ?Melatonin 10 MG CAPS Take 30 mg by mouth at bedtime.   Yes [provider]  ?metFORMIN (GLUCOPHAGE) 500 MG tablet Take 1,000 mg by mouth 2 (two) times daily. 02/17/19  Yes [provider]  ?Koren BoundNETOUCH ULTRA test strip CHECK FASTING SUGAR ONCE DAILY E11.9 11/29/20  Yes [provider]  ?sertraline (ZOLOFT) 25 MG tablet Take 1 tablet by mouth daily.  09/14/20  Yes [provider]  ?traZODone (DESYREL) 50 MG tablet Take 1 tablet by mouth at bedtime. 11/24/20 11/24/21 Yes [provider]  ?vitamin B-12 1000 MCG tablet Take 1 tablet (1,000 mcg total) by mouth daily. 04/11/20  Yes Arnetha CourserAmin, Sumayya, MD  ?acetaminophen (TYLENOL) 500 MG tablet Take 1,000 mg by mouth every 6 (six) hours as needed for moderate pain or headache.    [provider]  ?amiodarone (PACERONE) 200 MG tablet Take 200 mg by mouth daily.    [provider]  ?apixaban (ELIQUIS) 5 MG TABS tablet Take 5 mg by mouth 2 (two) times daily.     [provider]  ?atorvastatin (LIPITOR) 80 MG tablet Take by mouth. 04/17/21   [provider]  ?buPROPion (WELLBUTRIN XL) 300 MG 24 hr tablet Take 1 tablet by mouth daily. 04/17/21   [provider]  ?EPINEPHrine 0.3 mg/0.3 mL IJ SOAJ injection Inject 0.3 mg into the muscle as needed for anaphylaxis.    [provider]  ?furosemide (LASIX) 20 MG tablet Take 1 tablet (20 mg total) by mouth daily. ?Patient not taking: Reported on 06/13/2020 12/15/19 12/14/20  Arnetha Courser, MD  ?LAGEVRIO 200 MG CAPS capsule Take 4 capsules by mouth 2 (two) times daily. 03/11/21   [provider]  ?magnesium hydroxide (MILK OF MAGNESIA) 400 MG/5ML suspension Take 30 mLs by mouth daily as needed for mild constipation. 04/11/20   Arnetha Courser, MD  ?methocarbamol (ROBAXIN) 500 MG tablet Take 1 tablet (500 mg total) by mouth every 8 (eight) hours as needed for muscle spasms. 07/04/20   Lucy Chris, MD  ?nitroGLYCERIN (NITROSTAT) 0.4 MG SL tablet Place 0.4 mg under the tongue every 5 (five) minutes as needed for chest pain.    [provider]  ?sildenafil (VIAGRA) 50 MG tablet Take 50 mg by mouth daily as needed for erectile dysfunction.    [provider]  ?traMADol (ULTRAM) 50 MG tablet Take 1 tablet (50 mg total) by mouth every 6 (six) hours as needed. 07/04/20 07/04/21  Lucy Chris, MD  ?benazepril  (LOTENSIN) 20 MG tablet Take 20 mg by mouth daily. 03/19/19 08/13/19  [provider]  ? ? ?Family History ?Family History  ?Problem Relation Age of Onset  ? Osteoarthritis Mother   ? Other Father 23  ?     MV

## 2021-05-27 NOTE — ED Triage Notes (Signed)
Patient is here for "Cough, Congestion". Started with "st" on Monday, No Cough, Congestion, "? St still". No fever. No sob. No new/unexplained rash. @ home COVID19 test Negative (x2).  ?

## 2021-07-27 ENCOUNTER — Other Ambulatory Visit: Payer: Self-pay | Admitting: Student

## 2021-07-27 DIAGNOSIS — H02401 Unspecified ptosis of right eyelid: Secondary | ICD-10-CM

## 2021-07-27 DIAGNOSIS — G3184 Mild cognitive impairment, so stated: Secondary | ICD-10-CM

## 2021-08-11 ENCOUNTER — Ambulatory Visit
Admission: RE | Admit: 2021-08-11 | Discharge: 2021-08-11 | Disposition: A | Discharge status: Home / Self Care | Payer: Medicare HMO | Source: Ambulatory Visit | Attending: Student | Admitting: Student

## 2021-08-11 DIAGNOSIS — H02401 Unspecified ptosis of right eyelid: Secondary | ICD-10-CM | POA: Insufficient documentation

## 2021-08-11 DIAGNOSIS — I6782 Cerebral ischemia: Secondary | ICD-10-CM | POA: Diagnosis not present

## 2021-08-11 DIAGNOSIS — G3184 Mild cognitive impairment, so stated: Secondary | ICD-10-CM | POA: Insufficient documentation

## 2021-08-31 ENCOUNTER — Ambulatory Visit (INDEPENDENT_AMBULATORY_CARE_PROVIDER_SITE_OTHER): Payer: Medicare HMO | Admitting: Vascular Surgery

## 2021-08-31 ENCOUNTER — Encounter (INDEPENDENT_AMBULATORY_CARE_PROVIDER_SITE_OTHER): Payer: Self-pay | Admitting: Vascular Surgery

## 2021-08-31 VITALS — BP 147/75 | HR 54 | Resp 16 | Wt 208.4 lb

## 2021-08-31 DIAGNOSIS — I739 Peripheral vascular disease, unspecified: Secondary | ICD-10-CM | POA: Diagnosis not present

## 2021-08-31 DIAGNOSIS — I7143 Infrarenal abdominal aortic aneurysm, without rupture: Secondary | ICD-10-CM | POA: Diagnosis not present

## 2021-08-31 DIAGNOSIS — I10 Essential (primary) hypertension: Secondary | ICD-10-CM

## 2021-08-31 DIAGNOSIS — I25119 Atherosclerotic heart disease of native coronary artery with unspecified angina pectoris: Secondary | ICD-10-CM | POA: Diagnosis not present

## 2021-08-31 DIAGNOSIS — E119 Type 2 diabetes mellitus without complications: Secondary | ICD-10-CM

## 2021-08-31 DIAGNOSIS — E782 Mixed hyperlipidemia: Secondary | ICD-10-CM

## 2021-08-31 NOTE — Progress Notes (Unsigned)
MRN : [id number]  Devin Gowdadward Frank Krehbiel Jr. is a 76 y.o. (12/14/1945) male who presents with chief complaint of check circulation.  History of Present Illness: ***  Current Meds  Medication Sig   acetaminophen (TYLENOL) 500 MG tablet Take 1,000 mg by mouth every 6 (six) hours as needed for moderate pain or headache.   amiodarone (PACERONE) 200 MG tablet Take 200 mg by mouth daily.   apixaban (ELIQUIS) 5 MG TABS tablet Take 5 mg by mouth 2 (two) times daily.    atorvastatin (LIPITOR) 80 MG tablet Take by mouth.   benzonatate (TESSALON) 200 MG capsule Take by mouth.   buPROPion (WELLBUTRIN XL) 300 MG 24 hr tablet Take 1 tablet by mouth daily.   carvedilol (COREG) 12.5 MG tablet Take 12.5 mg by mouth 2 (two) times daily.   Continuous Blood Gluc Receiver (FREESTYLE LIBRE 14 DAY READER) DEVI Use 1 Device as directed   Emollient (CERAVE EX) Apply 1 application topically daily as needed (dry skin).   ENTRESTO 97-103 MG Take 1 tablet by mouth 2 (two) times daily.   EPINEPHrine 0.3 mg/0.3 mL IJ SOAJ injection Inject 0.3 mg into the muscle as needed for anaphylaxis.   esomeprazole (NEXIUM) 40 MG packet Take by mouth.   ezetimibe (ZETIA) 10 MG tablet Take 10 mg by mouth daily.   fluticasone (FLONASE) 50 MCG/ACT nasal spray SPRAY 2 SPRAYS INTO EACH NOSTRIL EVERY DAY   gabapentin (NEURONTIN) 300 MG capsule Take 1 capsule by mouth 2 (two) times daily.   glucose blood (PRECISION QID TEST) test strip Check fasting sugar once daily E11.9   ipratropium (ATROVENT) 0.06 % nasal spray Place 2 sprays into both nostrils 4 (four) times daily.   LAGEVRIO 200 MG CAPS capsule Take 4 capsules by mouth 2 (two) times daily.   Lancets (ONETOUCH DELICA PLUS LANCET33G) MISC Apply topically daily.   magnesium hydroxide (MILK OF MAGNESIA) 400 MG/5ML suspension Take 30 mLs by mouth daily as needed for mild constipation.   Melatonin 10 MG CAPS Take 30 mg by mouth at bedtime.   metFORMIN (GLUCOPHAGE) 500  MG tablet Take 1,000 mg by mouth 2 (two) times daily.   methocarbamol (ROBAXIN) 500 MG tablet Take 1 tablet (500 mg total) by mouth every 8 (eight) hours as needed for muscle spasms.   nitroGLYCERIN (NITROSTAT) 0.4 MG SL tablet Place 0.4 mg under the tongue every 5 (five) minutes as needed for chest pain.   ONETOUCH ULTRA test strip CHECK FASTING SUGAR ONCE DAILY E11.9   sertraline (ZOLOFT) 25 MG tablet Take 1 tablet by mouth daily.   sildenafil (VIAGRA) 50 MG tablet Take 50 mg by mouth daily as needed for erectile dysfunction.   traZODone (DESYREL) 50 MG tablet Take 1 tablet by mouth at bedtime.   vitamin B-12 1000 MCG tablet Take 1 tablet (1,000 mcg total) by mouth daily.    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   Hypertension    Ischemic cardiomyopathy  MI, old    PFO (patent foramen ovale) 05/2019   s/p repair   Sleep apnea    Spinal stenosis of lumbar region     Past Surgical History:  Procedure Laterality Date   APPENDECTOMY     BREAST SURGERY     benign mass   CARDIOVERSION Right 12/15/2019   Procedure: CARDIOVERSION;  Surgeon: Laurier Nancy, MD;  Location: [facility];  Service: Cardiovascular;  Laterality: Right;   COLONOSCOPY     CORONARY ANGIOPLASTY WITH STENT PLACEMENT     CORONARY ARTERY BYPASS GRAFT  05/2019   LIMA-LAD   LEFT HEART CATH AND CORS/GRAFTS ANGIOGRAPHY N/A 05/21/2019   Procedure: LEFT HEART CATH AND CORONARY ANGIOGRAPHY;  Surgeon: Laurier Nancy, MD;  Location: [facility];  Service: Cardiovascular;  Laterality: N/A;   LUMBAR LAMINECTOMY/DECOMPRESSION MICRODISCECTOMY N/A 07/04/2020    Procedure: L4-5 LAMINECTOMY;  Surgeon: Lucy Chris, MD;  Location: [facility] OR[facility];  Service: Neurosurgery;  Laterality: N/A;   REPLACEMENT TOTAL KNEE BILATERAL      Social History Social History   Tobacco Use   Smoking status: Former   Smokeless tobacco: Never   Tobacco comments:    Quit over 40 years ago  Vaping Use   Vaping Use: Never used  Substance Use Topics   Alcohol use: Not Currently    Comment: social   Drug use: Never    Family History Family History  Problem Relation Age of Onset   Osteoarthritis Mother    Other Father 5       MVA    Allergies  Allergen Reactions   Ace Inhibitors Swelling   Morphine Swelling   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     REVIEW OF SYSTEMS (Negative unless checked)  Constitutional: [] Weight loss  [] Fever  [] Chills Cardiac: [] Chest pain   [] Chest pressure   [] Palpitations   [] Shortness of breath when laying flat   [] Shortness of breath with exertion. Vascular:  [x] Pain in legs with walking   [] Pain in legs at rest  [] History of DVT   [] Phlebitis   [] Swelling in legs   [] Varicose veins   [] Non-healing ulcers Pulmonary:   [] Uses home oxygen   [] Productive cough   [] Hemoptysis   [] Wheeze  [] COPD   [] Asthma Neurologic:  [] Dizziness   [] Seizures   [] History of stroke   [] History of TIA  [] Aphasia   [] Vissual changes   [] Weakness or numbness in arm   [] Weakness or numbness in leg Musculoskeletal:   [] Joint swelling   [] Joint pain   [] Low back pain Hematologic:  [] Easy bruising  [] Easy bleeding   [] Hypercoagulable state   [] Anemic Gastrointestinal:  [] Diarrhea   [] Vomiting  [] Gastroesophageal reflux/heartburn   [] Difficulty swallowing. Genitourinary:  [] Chronic kidney disease   [] Difficult urination  [] Frequent urination   [] Blood in urine Skin:  [] Rashes   [] Ulcers  Psychological:  [] History of anxiety   []  History of major depression.  Physical Examination  Vitals:   08/31/21 1332  BP: (!) 147/75  Pulse: (!) 54  Resp: 16   Weight: 208 lb 6.4 oz (94.5 kg)   Body mass index is 29.07 kg/m. Gen: WD/WN, NAD Head: [place]/AT, No temporalis wasting.  Ear/Nose/Throat: Hearing grossly intact, nares w/o erythema or drainage Eyes: PER, EOMI, sclera nonicteric.  Neck: Supple, no masses.  No bruit or JVD.  Pulmonary:  Good air movement, no audible wheezing, no use of accessory muscles.  Cardiac: RRR, normal S1, S2, no Murmurs. Vascular:  mild trophic changes, no  open wounds Vessel Right Left  Radial Palpable Palpable  PT Not Palpable Not Palpable  DP Not Palpable Not Palpable  Gastrointestinal: soft, non-distended. No guarding/no peritoneal signs.  Musculoskeletal: M/S 5/5 throughout.  No visible deformity.  Neurologic: CN 2-12 intact. Pain and light touch intact in extremities.  Symmetrical.  Speech is fluent. Motor exam as listed above. Psychiatric: Judgment intact, Mood & affect appropriate for pt's clinical situation. Dermatologic: No rashes or ulcers noted.  No changes consistent with cellulitis.   CBC Lab Results  Component Value Date   WBC 7.3 06/24/2020   HGB 15.3 07/04/2020   HCT 45.0 07/04/2020   MCV 95.0 06/24/2020   PLT 328 06/24/2020    BMET    Component Value Date/Time   NA 142 07/04/2020 0630   K 4.7 07/04/2020 0630   CL 107 07/04/2020 0630   CO2 27 06/24/2020 1053   GLUCOSE 111 (H) 07/04/2020 0630   BUN 16 07/04/2020 0630   CREATININE 0.80 07/04/2020 0630   CREATININE 1.07 11/09/2013 1359   CALCIUM 9.3 06/24/2020 1053   GFRNONAA >60 06/24/2020 1053   GFRNONAA >60 11/09/2013 1359   GFRAA >60 12/15/2019 0440   GFRAA >60 11/09/2013 1359   CrCl cannot be calculated (Patient's most recent lab result is older than the maximum 21 days allowed.).  COAG Lab Results  Component Value Date   INR 1.0 06/24/2020   INR 1.0 05/04/2020   INR 1.2 12/15/2019    Radiology MR ANGIO HEAD WO CONTRAST  Result Date: 08/14/2021 CLINICAL DATA:  Initial evaluation for right eyelid droop for 1-2 years.  EXAM: MRI HEAD WITHOUT CONTRAST MRA HEAD WITHOUT CONTRAST TECHNIQUE: Multiplanar, multi-echo pulse sequences of the brain and surrounding structures were acquired without intravenous contrast. Angiographic images of the Circle of Willis were acquired using MRA technique without intravenous contrast. COMPARISON:  Prior CT from 04/07/2020. FINDINGS: MRI HEAD FINDINGS Brain: Mild diffuse prominence of the CSF containing spaces compatible generalized cerebral atrophy, within normal limits for age. Patchy T2/FLAIR hyperintensity involving the periventricular deep white matter both cerebral hemispheres, most consistent with chronic small vessel ischemic disease, mild for age. No abnormal foci of restricted diffusion to suggest acute or subacute ischemia. Gray-white matter differentiation maintained. No visible areas of chronic infarction. No foci of susceptibility artifact to suggest acute or chronic intracranial hemorrhage. No mass lesion, midline shift or mass effect. Mild ventricular prominence related to global parenchymal volume loss of hydrocephalus. No extra-axial fluid collection. Pituitary gland suprasellar region normal. Midline structures intact and normally formed. Vascular: Major intracranial vascular flow voids are well maintained. Skull and upper cervical spine: Craniocervical junction within normal limits. Bone marrow signal intensity normal. No scalp soft tissue abnormality. Sinuses/Orbits: Patient status post bilateral ocular lens replacement. Globes and orbital soft tissues otherwise unremarkable. Mild mucoperiosteal thickening present about the ethmoidal air cells and maxillary sinuses. Small right with trace left mastoid effusions. Visualized nasopharynx unremarkable. Inner ear structures grossly normal. Other: None. MRA HEAD FINDINGS Anterior circulation: Examination degraded by motion. Visualized distal cervical segments of the internal carotid arteries are patent with antegrade flow. Petrous  segments patent bilaterally. Mild atheromatous irregularity seen within the carotid siphons without hemodynamically significant stenosis or other abnormality. A1 segments patent bilaterally. Right A1 hypoplastic, accounting for the slightly diminutive right ICA is compared to the left. Normal anterior communicating artery complex. Anterior cerebral arteries patent without significant stenosis. No M1 stenosis or occlusion. Normal MCA bifurcations. No proximal MCA branch occlusion. Distal MCA branches perfused and symmetric.  Distal small vessel atheromatous irregularity noted. Posterior circulation: Both V4 segments patent to the vertebrobasilar junction without stenosis. Left vertebral artery dominant. Apparent focal irregularity involving the mid left V4 segment on MIP reconstructions noted, favored to be artifactual nature. Neither PICA origin visualized. Basilar mildly tortuous but widely patent to its distal aspect. Dominant left AICA. Superior cerebral arteries patent bilaterally. Both PCAs supplied via hypoplastic P1 segments and robust bilateral posterior communicating arteries. Both PCAs well perfused to their distal aspects without stenosis. Anatomic variants: As above. No intracranial aneurysm or other vascular malformation. IMPRESSION: MRI HEAD IMPRESSION: 1. No acute intracranial abnormality. No findings to explain patient's symptoms identified. 2. Mild age-related cerebral atrophy with chronic small vessel ischemic disease. MRA HEAD IMPRESSION: 1. Negative intracranial MRA for large vessel occlusion. 2. Mild intracranial atherosclerotic disease for age. No hemodynamically significant or correctable stenosis. Electronically Signed   By: Rise Mu M.D.   On: 08/14/2021 03:48   MR BRAIN WO CONTRAST  Result Date: 08/14/2021 CLINICAL DATA:  Initial evaluation for right eyelid droop for 1-2 years. EXAM: MRI HEAD WITHOUT CONTRAST MRA HEAD WITHOUT CONTRAST TECHNIQUE: Multiplanar, multi-echo pulse  sequences of the brain and surrounding structures were acquired without intravenous contrast. Angiographic images of the Circle of Willis were acquired using MRA technique without intravenous contrast. COMPARISON:  Prior CT from 04/07/2020. FINDINGS: MRI HEAD FINDINGS Brain: Mild diffuse prominence of the CSF containing spaces compatible generalized cerebral atrophy, within normal limits for age. Patchy T2/FLAIR hyperintensity involving the periventricular deep white matter both cerebral hemispheres, most consistent with chronic small vessel ischemic disease, mild for age. No abnormal foci of restricted diffusion to suggest acute or subacute ischemia. Gray-white matter differentiation maintained. No visible areas of chronic infarction. No foci of susceptibility artifact to suggest acute or chronic intracranial hemorrhage. No mass lesion, midline shift or mass effect. Mild ventricular prominence related to global parenchymal volume loss of hydrocephalus. No extra-axial fluid collection. Pituitary gland suprasellar region normal. Midline structures intact and normally formed. Vascular: Major intracranial vascular flow voids are well maintained. Skull and upper cervical spine: Craniocervical junction within normal limits. Bone marrow signal intensity normal. No scalp soft tissue abnormality. Sinuses/Orbits: Patient status post bilateral ocular lens replacement. Globes and orbital soft tissues otherwise unremarkable. Mild mucoperiosteal thickening present about the ethmoidal air cells and maxillary sinuses. Small right with trace left mastoid effusions. Visualized nasopharynx unremarkable. Inner ear structures grossly normal. Other: None. MRA HEAD FINDINGS Anterior circulation: Examination degraded by motion. Visualized distal cervical segments of the internal carotid arteries are patent with antegrade flow. Petrous segments patent bilaterally. Mild atheromatous irregularity seen within the carotid siphons without  hemodynamically significant stenosis or other abnormality. A1 segments patent bilaterally. Right A1 hypoplastic, accounting for the slightly diminutive right ICA is compared to the left. Normal anterior communicating artery complex. Anterior cerebral arteries patent without significant stenosis. No M1 stenosis or occlusion. Normal MCA bifurcations. No proximal MCA branch occlusion. Distal MCA branches perfused and symmetric. Distal small vessel atheromatous irregularity noted. Posterior circulation: Both V4 segments patent to the vertebrobasilar junction without stenosis. Left vertebral artery dominant. Apparent focal irregularity involving the mid left V4 segment on MIP reconstructions noted, favored to be artifactual nature. Neither PICA origin visualized. Basilar mildly tortuous but widely patent to its distal aspect. Dominant left AICA. Superior cerebral arteries patent bilaterally. Both PCAs supplied via hypoplastic P1 segments and robust bilateral posterior communicating arteries. Both PCAs well perfused to their distal aspects without stenosis. Anatomic variants: As above. No intracranial  aneurysm or other vascular malformation. IMPRESSION: MRI HEAD IMPRESSION: 1. No acute intracranial abnormality. No findings to explain patient's symptoms identified. 2. Mild age-related cerebral atrophy with chronic small vessel ischemic disease. MRA HEAD IMPRESSION: 1. Negative intracranial MRA for large vessel occlusion. 2. Mild intracranial atherosclerotic disease for age. No hemodynamically significant or correctable stenosis. Electronically Signed   By: Rise Mu M.D.   On: 08/14/2021 03:48     Assessment/Plan There are no diagnoses linked to this encounter.   Levora Dredge, MD  08/31/2021 1:47 PM

## 2021-09-03 ENCOUNTER — Encounter (INDEPENDENT_AMBULATORY_CARE_PROVIDER_SITE_OTHER): Payer: Self-pay | Admitting: Vascular Surgery

## 2021-09-03 DIAGNOSIS — I739 Peripheral vascular disease, unspecified: Secondary | ICD-10-CM | POA: Insufficient documentation

## 2021-09-07 ENCOUNTER — Ambulatory Visit (INDEPENDENT_AMBULATORY_CARE_PROVIDER_SITE_OTHER): Payer: Medicare HMO

## 2021-09-07 ENCOUNTER — Ambulatory Visit (INDEPENDENT_AMBULATORY_CARE_PROVIDER_SITE_OTHER): Payer: Medicare HMO | Admitting: Vascular Surgery

## 2021-09-07 VITALS — BP 142/72 | HR 76 | Resp 16 | Wt 209.4 lb

## 2021-09-07 DIAGNOSIS — I25119 Atherosclerotic heart disease of native coronary artery with unspecified angina pectoris: Secondary | ICD-10-CM

## 2021-09-07 DIAGNOSIS — I7143 Infrarenal abdominal aortic aneurysm, without rupture: Secondary | ICD-10-CM

## 2021-09-07 DIAGNOSIS — I739 Peripheral vascular disease, unspecified: Secondary | ICD-10-CM

## 2021-09-07 DIAGNOSIS — E119 Type 2 diabetes mellitus without complications: Secondary | ICD-10-CM

## 2021-09-07 DIAGNOSIS — I10 Essential (primary) hypertension: Secondary | ICD-10-CM | POA: Diagnosis not present

## 2021-09-07 NOTE — Progress Notes (Signed)
MRN : [id number]  Devin [name]. is a 76 y.o. (02/02/1946) male who presents with chief complaint of check circulation.  History of Present Illness:  The patient returns to the office for followup and review of the noninvasive studies.   There have been no interval changes in lower extremity symptoms. No interval shortening of the patient's claudication distance or development of rest pain symptoms. No new ulcers or wounds have occurred since the last visit.  There have been no significant changes to the patient's overall health care.  The patient denies amaurosis fugax or recent TIA symptoms. There are no documented recent neurological changes noted. There is no history of DVT, PE or superficial thrombophlebitis. The patient denies recent episodes of angina or shortness of breath.   ABI Rt=1.09 and Lt=1.15  (triphasic signals throughout) Duplex ultrasound of the abdominal aorta shows 2.73 cm AAA    Current Meds  Medication Sig   acetaminophen (TYLENOL) 500 MG tablet Take 1,000 mg by mouth every 6 (six) hours as needed for moderate pain or headache.   amiodarone (PACERONE) 200 MG tablet Take 200 mg by mouth daily.   apixaban (ELIQUIS) 5 MG TABS tablet Take 5 mg by mouth 2 (two) times daily.    atorvastatin (LIPITOR) 80 MG tablet Take by mouth.   benzonatate (TESSALON) 200 MG capsule Take by mouth.   buPROPion (WELLBUTRIN XL) 300 MG 24 hr tablet Take 1 tablet by mouth daily.   carvedilol (COREG) 12.5 MG tablet Take 12.5 mg by mouth 2 (two) times daily.   Continuous Blood Gluc Receiver (FREESTYLE LIBRE 14 DAY READER) DEVI Use 1 Device as directed   Emollient (CERAVE EX) Apply 1 application topically daily as needed (dry skin).   ENTRESTO 97-103 MG Take 1 tablet by mouth 2 (two) times daily.   EPINEPHrine 0.3 mg/0.3 mL IJ SOAJ injection Inject 0.3 mg into the muscle as needed for anaphylaxis.   esomeprazole (NEXIUM) 40 MG packet Take by mouth.   ezetimibe  (ZETIA) 10 MG tablet Take 10 mg by mouth daily.   fluticasone (FLONASE) 50 MCG/ACT nasal spray SPRAY 2 SPRAYS INTO EACH NOSTRIL EVERY DAY   gabapentin (NEURONTIN) 300 MG capsule Take 1 capsule by mouth 2 (two) times daily.   glucose blood (PRECISION QID TEST) test strip Check fasting sugar once daily E11.9   ipratropium (ATROVENT) 0.06 % nasal spray Place 2 sprays into both nostrils 4 (four) times daily.   LAGEVRIO 200 MG CAPS capsule Take 4 capsules by mouth 2 (two) times daily.   Lancets (ONETOUCH DELICA PLUS LANCET33G) MISC Apply topically daily.   magnesium hydroxide (MILK OF MAGNESIA) 400 MG/5ML suspension Take 30 mLs by mouth daily as needed for mild constipation.   Melatonin 10 MG CAPS Take 30 mg by mouth at bedtime.   metFORMIN (GLUCOPHAGE) 500 MG tablet Take 1,000 mg by mouth 2 (two) times daily.   methocarbamol (ROBAXIN) 500 MG tablet Take 1 tablet (500 mg total) by mouth every 8 (eight) hours as needed for muscle spasms.   nitroGLYCERIN (NITROSTAT) 0.4 MG SL tablet Place 0.4 mg under the tongue every 5 (five) minutes as needed for chest pain.   ONETOUCH ULTRA test strip CHECK FASTING SUGAR ONCE DAILY E11.9   sertraline (ZOLOFT) 25 MG tablet Take 1 tablet by mouth daily.   sildenafil (VIAGRA) 50 MG tablet Take 50 mg by mouth daily as needed for erectile dysfunction.  traZODone (DESYREL) 50 MG tablet Take 1 tablet by mouth at bedtime.   vitamin B-12 1000 MCG tablet Take 1 tablet (1,000 mcg total) by mouth daily.    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   Hypertension    Ischemic cardiomyopathy    MI, old    PFO (patent  foramen ovale) 05/2019   s/p repair   Sleep apnea    Spinal stenosis of lumbar region     Past Surgical History:  Procedure Laterality Date   APPENDECTOMY     BREAST SURGERY     benign mass   CARDIOVERSION Right 12/15/2019   Procedure: CARDIOVERSION;  Surgeon: Laurier Nancy, MD;  Location: [facility] ORS;  Service: Cardiovascular;  Laterality: Right;   COLONOSCOPY     CORONARY ANGIOPLASTY WITH STENT PLACEMENT     CORONARY ARTERY BYPASS GRAFT  05/2019   LIMA-LAD   LEFT HEART CATH AND CORS/GRAFTS ANGIOGRAPHY N/A 05/21/2019   Procedure: LEFT HEART CATH AND CORONARY ANGIOGRAPHY;  Surgeon: Laurier Nancy, MD;  Location: [facility];  Service: Cardiovascular;  Laterality: N/A;   LUMBAR LAMINECTOMY/DECOMPRESSION MICRODISCECTOMY N/A 07/04/2020   Procedure: L4-5 LAMINECTOMY;  Surgeon: Lucy Chris, MD;  Location: [facility] ORS;  Service: Neurosurgery;  Laterality: N/A;   REPLACEMENT TOTAL KNEE BILATERAL      Social History Social History   Tobacco Use   Smoking status: Former   Smokeless tobacco: Never   Tobacco comments:    Quit over 40 years ago  Vaping Use   Vaping Use: Never used  Substance Use Topics   Alcohol use: Not Currently    Comment: social   Drug use: Never    Family History Family History  Problem Relation Age of Onset   Osteoarthritis Mother    Other Father 63       MVA    Allergies  Allergen Reactions   Ace Inhibitors Swelling   Morphine Swelling   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     REVIEW OF SYSTEMS (Negative unless checked)  Constitutional: [] Weight loss  [] Fever  [] Chills Cardiac: [] Chest pain   [] Chest pressure   [] Palpitations   [] Shortness of breath when laying flat   [] Shortness of breath with exertion. Vascular:  [x] Pain in legs with walking   [] Pain in legs at rest  [] History of DVT   [] Phlebitis   [] Swelling in legs   [] Varicose veins   [] Non-healing ulcers Pulmonary:   [] Uses home oxygen   [] Productive cough   [] Hemoptysis    [] Wheeze  [] COPD   [] Asthma Neurologic:  [] Dizziness   [] Seizures   [] History of stroke   [] History of TIA  [] Aphasia   [] Vissual changes   [] Weakness or numbness in arm   [] Weakness or numbness in leg Musculoskeletal:   [] Joint swelling   [] Joint pain   [] Low back pain Hematologic:  [] Easy bruising  [] Easy bleeding   [] Hypercoagulable state   [] Anemic Gastrointestinal:  [] Diarrhea   [] Vomiting  [] Gastroesophageal reflux/heartburn   [] Difficulty swallowing. Genitourinary:  [] Chronic kidney disease   [] Difficult urination  []   Frequent urination   Blood in urine Skin:  Rashes   Ulcers  Psychological:  History of anxiety    History of major depression.  Physical Examination  Vitals:   09/07/21 1225  BP: (!) 142/72  Pulse: 76  Resp: 16  Weight: 209 lb 6.4 oz (95 kg)   Body mass index is 29.21 kg/m. Gen: WD/WN, NAD Head: [place]/AT, No temporalis wasting.  Ear/Nose/Throat: Hearing grossly intact, nares w/o erythema or drainage Eyes: PER, EOMI, sclera nonicteric.  Neck: Supple, no masses.  No bruit or JVD.  Pulmonary:  Good air movement, no audible wheezing, no use of accessory muscles.  Cardiac: RRR, normal S1, S2, no Murmurs. Vascular:   Vessel Right Left  Radial Palpable Palpable  PT Trace  Palpable Trace  Palpable  DP Trace  Palpable Trace Palpable  Gastrointestinal: soft, non-distended. No guarding/no peritoneal signs.  Musculoskeletal: M/S 5/5 throughout.  No visible deformity.  Neurologic: CN 2-12 intact. Pain and light touch intact in extremities.  Symmetrical.  Speech is fluent. Motor exam as listed above. Psychiatric: Judgment intact, Mood & affect appropriate for pt's clinical situation. Dermatologic: No rashes or ulcers noted.  No changes consistent with cellulitis.   CBC Lab Results  Component Value Date   WBC 7.3 06/24/2020   HGB 15.3 07/04/2020   HCT 45.0 07/04/2020   MCV 95.0 06/24/2020   PLT 328 06/24/2020    BMET    Component Value Date/Time   NA  142 07/04/2020 0630   K 4.7 07/04/2020 0630   CL 107 07/04/2020 0630   CO2 27 06/24/2020 1053   GLUCOSE 111 (H) 07/04/2020 0630   BUN 16 07/04/2020 0630   CREATININE 0.80 07/04/2020 0630   CREATININE 1.07 11/09/2013 1359   CALCIUM 9.3 06/24/2020 1053   GFRNONAA >60 06/24/2020 1053   GFRNONAA >60 11/09/2013 1359   GFRAA >60 12/15/2019 0440   GFRAA >60 11/09/2013 1359   CrCl cannot be calculated (Patient's most recent lab result is older than the maximum 21 days allowed.).  COAG Lab Results  Component Value Date   INR 1.0 06/24/2020   INR 1.0 05/04/2020   INR 1.2 12/15/2019    Radiology MR ANGIO HEAD WO CONTRAST  Result Date: 08/14/2021 CLINICAL DATA:  Initial evaluation for right eyelid droop for 1-2 years. EXAM: MRI HEAD WITHOUT CONTRAST MRA HEAD WITHOUT CONTRAST TECHNIQUE: Multiplanar, multi-echo pulse sequences of the brain and surrounding structures were acquired without intravenous contrast. Angiographic images of the Circle of Willis were acquired using MRA technique without intravenous contrast. COMPARISON:  Prior CT from 04/07/2020. FINDINGS: MRI HEAD FINDINGS Brain: Mild diffuse prominence of the CSF containing spaces compatible generalized cerebral atrophy, within normal limits for age. Patchy T2/FLAIR hyperintensity involving the periventricular deep white matter both cerebral hemispheres, most consistent with chronic small vessel ischemic disease, mild for age. No abnormal foci of restricted diffusion to suggest acute or subacute ischemia. Gray-white matter differentiation maintained. No visible areas of chronic infarction. No foci of susceptibility artifact to suggest acute or chronic intracranial hemorrhage. No mass lesion, midline shift or mass effect. Mild ventricular prominence related to global parenchymal volume loss of hydrocephalus. No extra-axial fluid collection. Pituitary gland suprasellar region normal. Midline structures intact and normally formed. Vascular: Major  intracranial vascular flow voids are well maintained. Skull and upper cervical spine: Craniocervical junction within normal limits. Bone marrow signal intensity normal. No scalp soft tissue abnormality. Sinuses/Orbits: Patient status post bilateral ocular lens replacement. Globes and orbital soft tissues otherwise unremarkable. Mild  mucoperiosteal thickening present about the ethmoidal air cells and maxillary sinuses. Small right with trace left mastoid effusions. Visualized nasopharynx unremarkable. Inner ear structures grossly normal. Other: None. MRA HEAD FINDINGS Anterior circulation: Examination degraded by motion. Visualized distal cervical segments of the internal carotid arteries are patent with antegrade flow. Petrous segments patent bilaterally. Mild atheromatous irregularity seen within the carotid siphons without hemodynamically significant stenosis or other abnormality. A1 segments patent bilaterally. Right A1 hypoplastic, accounting for the slightly diminutive right ICA is compared to the left. Normal anterior communicating artery complex. Anterior cerebral arteries patent without significant stenosis. No M1 stenosis or occlusion. Normal MCA bifurcations. No proximal MCA branch occlusion. Distal MCA branches perfused and symmetric. Distal small vessel atheromatous irregularity noted. Posterior circulation: Both V4 segments patent to the vertebrobasilar junction without stenosis. Left vertebral artery dominant. Apparent focal irregularity involving the mid left V4 segment on MIP reconstructions noted, favored to be artifactual nature. Neither PICA origin visualized. Basilar mildly tortuous but widely patent to its distal aspect. Dominant left AICA. Superior cerebral arteries patent bilaterally. Both PCAs supplied via hypoplastic P1 segments and robust bilateral posterior communicating arteries. Both PCAs well perfused to their distal aspects without stenosis. Anatomic variants: As above. No intracranial  aneurysm or other vascular malformation. IMPRESSION: MRI HEAD IMPRESSION: 1. No acute intracranial abnormality. No findings to explain patient's symptoms identified. 2. Mild age-related cerebral atrophy with chronic small vessel ischemic disease. MRA HEAD IMPRESSION: 1. Negative intracranial MRA for large vessel occlusion. 2. Mild intracranial atherosclerotic disease for age. No hemodynamically significant or correctable stenosis. Electronically Signed   By: Rise Mu M.D.   On: 08/14/2021 03:48   MR BRAIN WO CONTRAST  Result Date: 08/14/2021 CLINICAL DATA:  Initial evaluation for right eyelid droop for 1-2 years. EXAM: MRI HEAD WITHOUT CONTRAST MRA HEAD WITHOUT CONTRAST TECHNIQUE: Multiplanar, multi-echo pulse sequences of the brain and surrounding structures were acquired without intravenous contrast. Angiographic images of the Circle of Willis were acquired using MRA technique without intravenous contrast. COMPARISON:  Prior CT from 04/07/2020. FINDINGS: MRI HEAD FINDINGS Brain: Mild diffuse prominence of the CSF containing spaces compatible generalized cerebral atrophy, within normal limits for age. Patchy T2/FLAIR hyperintensity involving the periventricular deep white matter both cerebral hemispheres, most consistent with chronic small vessel ischemic disease, mild for age. No abnormal foci of restricted diffusion to suggest acute or subacute ischemia. Gray-white matter differentiation maintained. No visible areas of chronic infarction. No foci of susceptibility artifact to suggest acute or chronic intracranial hemorrhage. No mass lesion, midline shift or mass effect. Mild ventricular prominence related to global parenchymal volume loss of hydrocephalus. No extra-axial fluid collection. Pituitary gland suprasellar region normal. Midline structures intact and normally formed. Vascular: Major intracranial vascular flow voids are well maintained. Skull and upper cervical spine: Craniocervical  junction within normal limits. Bone marrow signal intensity normal. No scalp soft tissue abnormality. Sinuses/Orbits: Patient status post bilateral ocular lens replacement. Globes and orbital soft tissues otherwise unremarkable. Mild mucoperiosteal thickening present about the ethmoidal air cells and maxillary sinuses. Small right with trace left mastoid effusions. Visualized nasopharynx unremarkable. Inner ear structures grossly normal. Other: None. MRA HEAD FINDINGS Anterior circulation: Examination degraded by motion. Visualized distal cervical segments of the internal carotid arteries are patent with antegrade flow. Petrous segments patent bilaterally. Mild atheromatous irregularity seen within the carotid siphons without hemodynamically significant stenosis or other abnormality. A1 segments patent bilaterally. Right A1 hypoplastic, accounting for the slightly diminutive right ICA is compared to the left. Normal anterior  communicating artery complex. Anterior cerebral arteries patent without significant stenosis. No M1 stenosis or occlusion. Normal MCA bifurcations. No proximal MCA branch occlusion. Distal MCA branches perfused and symmetric. Distal small vessel atheromatous irregularity noted. Posterior circulation: Both V4 segments patent to the vertebrobasilar junction without stenosis. Left vertebral artery dominant. Apparent focal irregularity involving the mid left V4 segment on MIP reconstructions noted, favored to be artifactual nature. Neither PICA origin visualized. Basilar mildly tortuous but widely patent to its distal aspect. Dominant left AICA. Superior cerebral arteries patent bilaterally. Both PCAs supplied via hypoplastic P1 segments and robust bilateral posterior communicating arteries. Both PCAs well perfused to their distal aspects without stenosis. Anatomic variants: As above. No intracranial aneurysm or other vascular malformation. IMPRESSION: MRI HEAD IMPRESSION: 1. No acute intracranial  abnormality. No findings to explain patient's symptoms identified. 2. Mild age-related cerebral atrophy with chronic small vessel ischemic disease. MRA HEAD IMPRESSION: 1. Negative intracranial MRA for large vessel occlusion. 2. Mild intracranial atherosclerotic disease for age. No hemodynamically significant or correctable stenosis. Electronically Signed   By: Rise Mu M.D.   On: 08/14/2021 03:48     Assessment/Plan 1. Infrarenal abdominal aortic aneurysm (AAA) without rupture (HCC) Recommend: No surgery or intervention is indicated at this time.  The patient has an asymptomatic abdominal aortic aneurysm that is less than 4 cm in maximal diameter.    I have reviewed the natural history of abdominal aortic aneurysm and the small risk of rupture for aneurysm less than 5 cm in size.  However, as these small aneurysms tend to enlarge over time, continued surveillance with ultrasound or CT scan is mandatory.   I have also discussed optimizing medical management with hypertension and lipid control and the importance of abstinence from tobacco.  The patient is also encouraged to exercise a minimum of 30 minutes 4 times a week.   Should the patient develop new onset abdominal or back pain or signs of peripheral embolization they are instructed to seek medical attention immediately and to alert the physician providing care that they have an aneurysm.   The patient voices their understanding.  The patient will return in 12 months with an aortic duplex.  - VAS US AORTA/IVC/ILIACS; Future  2. PAD (peripheral artery disease) (HCC) Recommend:  I do not find evidence of life style limiting vascular disease. The patient specifically denies life style limitation.  Previous noninvasive studies including ABI's of the legs do not identify critical vascular problems.  The patient should continue walking and begin a more formal exercise program. The patient should continue his antiplatelet  therapy and aggressive treatment of the lipid abnormalities.  The patient is instructed to call the office if there is a significant change in the lower extremity symptoms, particularly if a wound develops or there is an abrupt increase in leg pain.  3. Coronary artery disease involving native coronary artery of native heart with angina pectoris (HCC) Continue cardiac and antihypertensive medications as already ordered and reviewed, no changes at this time.  Continue statin as ordered and reviewed, no changes at this time  Nitrates PRN for chest pain   4. Primary hypertension Continue antihypertensive medications as already ordered, these medications have been reviewed and there are no changes at this time.   5. Controlled type 2 diabetes mellitus without complication, without long-term current use of insulin (HCC) Continue hypoglycemic medications as already ordered, these medications have been reviewed and there are no changes at this time.  Hgb A1C to be monitored as  already arranged by primary service     Levora DredgeGregory Paz Winsett, MD  09/07/2021 12:31 PM

## 2021-09-10 ENCOUNTER — Encounter (INDEPENDENT_AMBULATORY_CARE_PROVIDER_SITE_OTHER): Payer: Self-pay | Admitting: Vascular Surgery

## 2022-01-08 ENCOUNTER — Encounter (INDEPENDENT_AMBULATORY_CARE_PROVIDER_SITE_OTHER): Payer: Self-pay

## 2022-03-14 ENCOUNTER — Other Ambulatory Visit: Payer: Self-pay | Admitting: Orthopedic Surgery

## 2022-03-14 ENCOUNTER — Encounter: Payer: Self-pay | Admitting: Orthopedic Surgery

## 2022-03-14 DIAGNOSIS — Z01818 Encounter for other preprocedural examination: Secondary | ICD-10-CM

## 2022-03-14 NOTE — H&P (Signed)
NAME: Devin [name] [employer]. MRN:   [id number] DOB:   10-04-1945     HISTORY AND PHYSICAL  CHIEF COMPLAINT:  right hip pain  HISTORY:   Devin Danielsis a 77 y.o. male  with right  Hip Pain Patient complains of right hip pain. Onset of the symptoms was several years ago. Inciting event: known DJD. The patient reports the hip pain is worse with weight bearing. Associated symptoms: none. Aggravating symptoms include: any weight bearing. Patient has had prior hip problems. Previous visits for this problem: yes, last seen several weeks ago by me. Evaluation to date: plain films, which were abnormal  osteoarthritis . Treatment to date: OTC analgesics, which have been somewhat effective, prescription analgesics, which have been somewhat effective, and physical therapy, which has been somewhat effective. . 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   Hypertension    Ischemic cardiomyopathy    MI, old    PFO (patent foramen ovale) 05/2019   s/p repair   Sleep apnea    Spinal stenosis of lumbar region     PAST SURGICAL HISTORY:   Past Surgical History:  Procedure Laterality Date   APPENDECTOMY     BREAST SURGERY     benign mass   CARDIOVERSION Right 12/15/2019   Procedure: CARDIOVERSION;  Surgeon: Dionisio David, MD;  Location: [facility] OR[facility];  Service: Cardiovascular;  Laterality: Right;   COLONOSCOPY     CORONARY ANGIOPLASTY WITH STENT PLACEMENT     CORONARY ARTERY BYPASS GRAFT  05/2019   LIMA-LAD   LEFT HEART CATH AND CORS/GRAFTS ANGIOGRAPHY N/A 05/21/2019   Procedure:  LEFT HEART CATH AND CORONARY ANGIOGRAPHY;  Surgeon: Dionisio David, MD;  Location: [place] [facility];  Service: Cardiovascular;  Laterality: N/A;   LUMBAR LAMINECTOMY/DECOMPRESSION MICRODISCECTOMY N/A 07/04/2020   Procedure: L4-5 LAMINECTOMY;  Surgeon: Deetta Perla, MD;  Location: [facility] OR[facility];  Service: Neurosurgery;  Laterality: N/A;   REPLACEMENT TOTAL KNEE BILATERAL      MEDICATIONS:  (Not in a hospital admission)   ALLERGIES:   Allergies  Allergen Reactions   Ace Inhibitors Swelling   Morphine Swelling   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    REVIEW OF SYSTEMS:   Negative except HPI  FAMILY HISTORY:   Family History  Problem Relation Age of Onset   Osteoarthritis Mother    Other Father 32       MVA    SOCIAL HISTORY:   reports that he has quit smoking. He has never used smokeless tobacco. He reports that he does not currently use alcohol. He reports that he does not use drugs.  PHYSICAL EXAM:  General appearance: alert, cooperative, and no distress Neck: no JVD and supple, symmetrical, trachea midline Resp: clear to auscultation bilaterally Cardio: regular rate and rhythm, S1, S2 normal, no murmur, click, rub or gallop GI: soft, non-tender; bowel sounds normal; no masses,  no organomegaly Extremities: extremities normal, atraumatic, no cyanosis or edema and Homans sign is negative, no  sign of DVT Pulses: 2+ and symmetric Skin: Skin color, texture, turgor normal. No rashes or lesions Neurologic: Alert and oriented X 3, normal strength and tone. Normal symmetric reflexes. Normal coordination and gait    LABORATORY STUDIES: No results for input(s): "WBC", "HGB", "HCT", "PLT" in the last 72 hours.  No results for input(s): "NA", "K", "CL", "CO2", "GLUCOSE", "BUN", "CREATININE", "CALCIUM" in the last 72 hours.  STUDIES/RESULTS:  No results found.  ASSESSMENT:  End stage osteoarthritis right hip        Active Problems:   * No active hospital problems. *     PLAN:  Right Primary Total Hip   Carlynn Spry 03/14/2022. 7:50 PM

## 2022-03-15 ENCOUNTER — Other Ambulatory Visit: Payer: Self-pay

## 2022-03-15 ENCOUNTER — Encounter
Admission: RE | Admit: 2022-03-15 | Discharge: 2022-03-15 | Disposition: A | Discharge status: Home / Self Care | Payer: Medicare HMO | Source: Ambulatory Visit | Attending: Orthopedic Surgery | Admitting: Orthopedic Surgery

## 2022-03-15 VITALS — BP 150/70 | HR 61 | Resp 16 | Ht 72.0 in | Wt 206.0 lb

## 2022-03-15 DIAGNOSIS — Z01818 Encounter for other preprocedural examination: Secondary | ICD-10-CM | POA: Diagnosis not present

## 2022-03-15 DIAGNOSIS — I4891 Unspecified atrial fibrillation: Secondary | ICD-10-CM | POA: Diagnosis not present

## 2022-03-15 DIAGNOSIS — R001 Bradycardia, unspecified: Secondary | ICD-10-CM | POA: Insufficient documentation

## 2022-03-15 DIAGNOSIS — I252 Old myocardial infarction: Secondary | ICD-10-CM | POA: Insufficient documentation

## 2022-03-15 DIAGNOSIS — E119 Type 2 diabetes mellitus without complications: Secondary | ICD-10-CM | POA: Insufficient documentation

## 2022-03-15 DIAGNOSIS — I7143 Infrarenal abdominal aortic aneurysm, without rupture: Secondary | ICD-10-CM | POA: Insufficient documentation

## 2022-03-15 HISTORY — DX: Polyneuropathy, unspecified: G62.9

## 2022-03-15 HISTORY — DX: Transient cerebral ischemic attack, unspecified: G45.9

## 2022-03-15 LAB — BASIC METABOLIC PANEL
Anion gap: 11 (ref 5–15)
BUN: 15 mg/dL (ref 8–23)
CO2: 26 mmol/L (ref 22–32)
Calcium: 9 mg/dL (ref 8.9–10.3)
Chloride: 99 mmol/L (ref 98–111)
Creatinine, Ser: 0.69 mg/dL (ref 0.61–1.24)
GFR, Estimated: >60 mL/min (ref >60)
Glucose, Bld: 113 mg/dL — ABNORMAL HIGH (ref 70–99)
Potassium: 3.7 mmol/L (ref 3.5–5.1)
Sodium: 136 mmol/L (ref 135–145)

## 2022-03-15 LAB — CBC
HCT: 40.7 % (ref 39.0–52.0)
Hemoglobin: 12.9 g/dL — ABNORMAL LOW (ref 13.0–17.0)
MCH: 30.3 pg (ref 26.0–34.0)
MCHC: 31.7 g/dL (ref 30.0–36.0)
MCV: 95.5 fL (ref 80.0–100.0)
Platelets: 278 10*3/uL (ref 150–400)
RBC: 4.26 MIL/uL (ref 4.22–5.81)
RDW: 14.4 % (ref 11.5–15.5)
WBC: 7.9 10*3/uL (ref 4.0–10.5)
nRBC: 0 % (ref 0.0–0.2)

## 2022-03-15 LAB — SURGICAL PCR SCREEN
MRSA, PCR: NEGATIVE
Staphylococcus aureus: NEGATIVE

## 2022-03-15 LAB — URINALYSIS, ROUTINE W REFLEX MICROSCOPIC
Bilirubin Urine: NEGATIVE
Glucose, UA: NEGATIVE mg/dL
Hgb urine dipstick: NEGATIVE
Ketones, ur: NEGATIVE mg/dL
Leukocytes,Ua: NEGATIVE
Nitrite: NEGATIVE
Protein, ur: NEGATIVE mg/dL
Specific Gravity, Urine: 1.003 — ABNORMAL LOW (ref 1.005–1.030)
pH: 6 (ref 5.0–8.0)

## 2022-03-15 LAB — TYPE AND SCREEN
ABO/RH(D): A NEG
Antibody Screen: NEGATIVE

## 2022-03-15 NOTE — Patient Instructions (Addendum)
Your procedure is scheduled on: 03/27/22 Report to [facility]. To find out your arrival time please call [phone] between 1PM - 3PM on 03/26/22.  Remember: Instructions that are not followed completely may result in serious medical risk, up to and including death, or upon the discretion of your surgeon and anesthesiologist your surgery may need to be rescheduled.     _X__ 1. Do not eat food or drink any liquids after midnight Monday 03/26/22.  __X__2.  On the morning of surgery brush your teeth with toothpaste and water, you                 may rinse your mouth with mouthwash if you wish.  Do not swallow any              toothpaste of mouthwash.     _X__ 3.  No Alcohol for 24 hours before or after surgery.   _X__ 4.  Do Not Smoke or use e-cigarettes For 24 Hours Prior to Your Surgery.                 Do not use any chewable tobacco products for at least 6 hours prior to                 surgery.  ____  5.  Bring all medications with you on the day of surgery if instructed.   __X__  6.  Notify your doctor if there is any change in your medical condition      (cold, fever, infections).     Do not wear jewelry, make-up, hairpins, clips or nail polish. Do not wear lotions, powders, or perfumes. You may use deodorant. Do not shave body hair 48 hours prior to surgery. Men may shave face and neck. Do not bring valuables to the hospital.    [facility] is not responsible for any belongings or valuables.  Contacts, dentures/partials or body piercings may not be worn into surgery. Bring a case for your contacts, glasses or hearing aids, a denture cup will be supplied. Leave your suitcase in the car. After surgery it may be brought to your room. For patients admitted to the hospital, discharge time is determined by your treatment team.   Patients discharged the day of surgery will not be allowed to drive home.   Please read over the  following fact sheets that you were given:   MRSA Information, CHG soap, Incentive Spirometer  __X__ Take these medicines the morning of surgery with A SIP OF WATER:    1. amiodorone  2. atorvastatin  3. bupropion  4. carvedilol  5. ezetimibe  6. gabapentin  7. Sertraline May use nasal spray  ____ Fleet Enema (as directed)   __X__ Use CHG Soap/SAGE wipes as directed  ____ Use inhalers on the day of surgery  ____ Stop metformin/Janumet/Farxiga 2 days prior to surgery    ____ Take 1/2 of usual insulin dose the night before surgery. No insulin the morning          of surgery.   ____ Stop Blood Thinners Coumadin/Plavix/Xarelto/Pleta/Pradaxa/Eliquis/Effient/Aspirin  on   Or contact your Surgeon, Cardiologist or Medical Doctor regarding  ability to stop your blood thinners  __X__ Stop Anti-inflammatories 7 days before surgery such as Advil, Ibuprofen, Motrin,  BC or Goodies Powder, Naprosyn, Naproxen, Aleve, Aspirin    __X__ Stop all herbals and supplements, fish oil or vitamins  until after surgery.  ____ Bring C-Pap to the hospital.   Milissa from [facility] will contact you about stopping your Eliquis for surgery. Contact her if you have not heard anything at least 1 week prior to surgery.

## 2022-03-27 ENCOUNTER — Ambulatory Visit: Admission: RE | Admit: 2022-03-27 | Payer: Medicare HMO | Source: Home / Self Care | Admitting: Orthopedic Surgery

## 2022-03-27 ENCOUNTER — Encounter: Admission: RE | Payer: Self-pay | Source: Home / Self Care

## 2022-03-27 SURGERY — ARTHROPLASTY, HIP, TOTAL, ANTERIOR APPROACH
Anesthesia: Spinal | Site: Hip | Laterality: Right

## 2022-04-12 ENCOUNTER — Inpatient Hospital Stay
Admission: EM | Admit: 2022-04-12 | Discharge: 2022-04-16 | DRG: 887 | Disposition: A | Discharge status: Home Health | Payer: Medicare HMO | Attending: Student in an Organized Health Care Education/Training Program | Admitting: Student in an Organized Health Care Education/Training Program

## 2022-04-12 ENCOUNTER — Other Ambulatory Visit: Payer: Self-pay

## 2022-04-12 ENCOUNTER — Emergency Department: Payer: Medicare HMO

## 2022-04-12 DIAGNOSIS — I110 Hypertensive heart disease with heart failure: Secondary | ICD-10-CM | POA: Diagnosis present

## 2022-04-12 DIAGNOSIS — R2681 Unsteadiness on feet: Secondary | ICD-10-CM

## 2022-04-12 DIAGNOSIS — G4700 Insomnia, unspecified: Principal | ICD-10-CM | POA: Diagnosis present

## 2022-04-12 DIAGNOSIS — E871 Hypo-osmolality and hyponatremia: Secondary | ICD-10-CM | POA: Diagnosis present

## 2022-04-12 DIAGNOSIS — F19921 Other psychoactive substance use, unspecified with intoxication with delirium: Secondary | ICD-10-CM | POA: Diagnosis present

## 2022-04-12 DIAGNOSIS — Z91030 Bee allergy status: Secondary | ICD-10-CM

## 2022-04-12 DIAGNOSIS — R443 Hallucinations, unspecified: Secondary | ICD-10-CM | POA: Diagnosis not present

## 2022-04-12 DIAGNOSIS — F419 Anxiety disorder, unspecified: Secondary | ICD-10-CM | POA: Diagnosis present

## 2022-04-12 DIAGNOSIS — I480 Paroxysmal atrial fibrillation: Secondary | ICD-10-CM | POA: Diagnosis present

## 2022-04-12 DIAGNOSIS — E785 Hyperlipidemia, unspecified: Secondary | ICD-10-CM | POA: Diagnosis present

## 2022-04-12 DIAGNOSIS — R410 Disorientation, unspecified: Secondary | ICD-10-CM | POA: Diagnosis not present

## 2022-04-12 DIAGNOSIS — I700 Atherosclerosis of aorta: Secondary | ICD-10-CM | POA: Diagnosis present

## 2022-04-12 DIAGNOSIS — Z96653 Presence of artificial knee joint, bilateral: Secondary | ICD-10-CM | POA: Diagnosis present

## 2022-04-12 DIAGNOSIS — I10 Essential (primary) hypertension: Secondary | ICD-10-CM

## 2022-04-12 DIAGNOSIS — R112 Nausea with vomiting, unspecified: Secondary | ICD-10-CM | POA: Diagnosis present

## 2022-04-12 DIAGNOSIS — I2510 Atherosclerotic heart disease of native coronary artery without angina pectoris: Secondary | ICD-10-CM | POA: Diagnosis present

## 2022-04-12 DIAGNOSIS — I252 Old myocardial infarction: Secondary | ICD-10-CM

## 2022-04-12 DIAGNOSIS — I255 Ischemic cardiomyopathy: Secondary | ICD-10-CM | POA: Diagnosis present

## 2022-04-12 DIAGNOSIS — I160 Hypertensive urgency: Secondary | ICD-10-CM | POA: Diagnosis present

## 2022-04-12 DIAGNOSIS — Z951 Presence of aortocoronary bypass graft: Secondary | ICD-10-CM

## 2022-04-12 DIAGNOSIS — Z7901 Long term (current) use of anticoagulants: Secondary | ICD-10-CM

## 2022-04-12 DIAGNOSIS — I714 Abdominal aortic aneurysm, without rupture, unspecified: Secondary | ICD-10-CM | POA: Diagnosis present

## 2022-04-12 DIAGNOSIS — R262 Difficulty in walking, not elsewhere classified: Secondary | ICD-10-CM | POA: Diagnosis present

## 2022-04-12 DIAGNOSIS — F32A Depression, unspecified: Secondary | ICD-10-CM | POA: Diagnosis present

## 2022-04-12 DIAGNOSIS — R400 Somnolence: Secondary | ICD-10-CM

## 2022-04-12 DIAGNOSIS — T43015A Adverse effect of tricyclic antidepressants, initial encounter: Secondary | ICD-10-CM | POA: Diagnosis present

## 2022-04-12 DIAGNOSIS — R0902 Hypoxemia: Secondary | ICD-10-CM | POA: Diagnosis present

## 2022-04-12 DIAGNOSIS — E878 Other disorders of electrolyte and fluid balance, not elsewhere classified: Secondary | ICD-10-CM | POA: Diagnosis present

## 2022-04-12 DIAGNOSIS — Z9682 Presence of neurostimulator: Secondary | ICD-10-CM

## 2022-04-12 DIAGNOSIS — E876 Hypokalemia: Secondary | ICD-10-CM | POA: Diagnosis present

## 2022-04-12 DIAGNOSIS — M1990 Unspecified osteoarthritis, unspecified site: Secondary | ICD-10-CM | POA: Diagnosis present

## 2022-04-12 DIAGNOSIS — Z955 Presence of coronary angioplasty implant and graft: Secondary | ICD-10-CM

## 2022-04-12 DIAGNOSIS — Z1152 Encounter for screening for COVID-19: Secondary | ICD-10-CM

## 2022-04-12 DIAGNOSIS — I5032 Chronic diastolic (congestive) heart failure: Secondary | ICD-10-CM | POA: Diagnosis present

## 2022-04-12 DIAGNOSIS — Z8673 Personal history of transient ischemic attack (TIA), and cerebral infarction without residual deficits: Secondary | ICD-10-CM

## 2022-04-12 DIAGNOSIS — Z888 Allergy status to other drugs, medicaments and biological substances status: Secondary | ICD-10-CM

## 2022-04-12 DIAGNOSIS — G4730 Sleep apnea, unspecified: Secondary | ICD-10-CM | POA: Diagnosis present

## 2022-04-12 DIAGNOSIS — E1142 Type 2 diabetes mellitus with diabetic polyneuropathy: Secondary | ICD-10-CM | POA: Diagnosis present

## 2022-04-12 DIAGNOSIS — R4182 Altered mental status, unspecified: Secondary | ICD-10-CM

## 2022-04-12 DIAGNOSIS — K529 Noninfective gastroenteritis and colitis, unspecified: Secondary | ICD-10-CM | POA: Diagnosis present

## 2022-04-12 DIAGNOSIS — Z885 Allergy status to narcotic agent status: Secondary | ICD-10-CM

## 2022-04-12 LAB — CBC WITH DIFFERENTIAL/PLATELET
Abs Immature Granulocytes: 0.05 10*3/uL (ref 0.00–0.07)
Basophils Absolute: 0 10*3/uL (ref 0.0–0.1)
Basophils Relative: 0 %
Eosinophils Absolute: 0.1 10*3/uL (ref 0.0–0.5)
Eosinophils Relative: 0 %
HCT: 43.3 % (ref 39.0–52.0)
Hemoglobin: 14.3 g/dL (ref 13.0–17.0)
Immature Granulocytes: 0 %
Lymphocytes Relative: 12 %
Lymphs Abs: 1.4 10*3/uL (ref 0.7–4.0)
MCH: 30.4 pg (ref 26.0–34.0)
MCHC: 33 g/dL (ref 30.0–36.0)
MCV: 92.1 fL (ref 80.0–100.0)
Monocytes Absolute: 0.8 10*3/uL (ref 0.1–1.0)
Monocytes Relative: 7 %
Neutro Abs: 9.3 10*3/uL — ABNORMAL HIGH (ref 1.7–7.7)
Neutrophils Relative %: 81 %
Platelets: 320 10*3/uL (ref 150–400)
RBC: 4.7 MIL/uL (ref 4.22–5.81)
RDW: 13.3 % (ref 11.5–15.5)
WBC: 11.6 10*3/uL — ABNORMAL HIGH (ref 4.0–10.5)
nRBC: 0 % (ref 0.0–0.2)

## 2022-04-12 LAB — URINE DRUG SCREEN, QUALITATIVE (ARMC ONLY)
Amphetamines, Ur Screen: NOT DETECTED
Barbiturates, Ur Screen: NOT DETECTED
Benzodiazepine, Ur Scrn: NOT DETECTED
Cannabinoid 50 Ng, Ur ~~LOC~~: NOT DETECTED
Cocaine Metabolite,Ur ~~LOC~~: NOT DETECTED
MDMA (Ecstasy)Ur Screen: NOT DETECTED
Methadone Scn, Ur: NOT DETECTED
Opiate, Ur Screen: NOT DETECTED
Phencyclidine (PCP) Ur S: NOT DETECTED
Tricyclic, Ur Screen: NOT DETECTED

## 2022-04-12 LAB — LIPASE, BLOOD: Lipase: 30 U/L (ref 11–51)

## 2022-04-12 LAB — URINALYSIS, ROUTINE W REFLEX MICROSCOPIC
Bacteria, UA: NONE SEEN
Bilirubin Urine: NEGATIVE
Glucose, UA: NEGATIVE mg/dL
Hgb urine dipstick: NEGATIVE
Ketones, ur: NEGATIVE mg/dL
Leukocytes,Ua: NEGATIVE
Nitrite: NEGATIVE
Protein, ur: 100 mg/dL — AB
Specific Gravity, Urine: 1.018 (ref 1.005–1.030)
pH: 6 (ref 5.0–8.0)

## 2022-04-12 LAB — COMPREHENSIVE METABOLIC PANEL
ALT: 31 U/L (ref 0–44)
AST: 30 U/L (ref 15–41)
Albumin: 4.3 g/dL (ref 3.5–5.0)
Alkaline Phosphatase: 55 U/L (ref 38–126)
Anion gap: 9 (ref 5–15)
BUN: 11 mg/dL (ref 8–23)
CO2: 24 mmol/L (ref 22–32)
Calcium: 9.1 mg/dL (ref 8.9–10.3)
Chloride: 95 mmol/L — ABNORMAL LOW (ref 98–111)
Creatinine, Ser: 0.59 mg/dL — ABNORMAL LOW (ref 0.61–1.24)
GFR, Estimated: >60 mL/min (ref >60)
Glucose, Bld: 123 mg/dL — ABNORMAL HIGH (ref 70–99)
Potassium: 3.6 mmol/L (ref 3.5–5.1)
Sodium: 128 mmol/L — ABNORMAL LOW (ref 135–145)
Total Bilirubin: 1 mg/dL (ref 0.3–1.2)
Total Protein: 7.3 g/dL (ref 6.5–8.1)

## 2022-04-12 LAB — MAGNESIUM: Magnesium: 1.5 mg/dL — ABNORMAL LOW (ref 1.7–2.4)

## 2022-04-12 LAB — TROPONIN I (HIGH SENSITIVITY)
Troponin I (High Sensitivity): 13 ng/L (ref <18)
Troponin I (High Sensitivity): 13 ng/L (ref <18)

## 2022-04-12 LAB — CK: Total CK: 118 U/L (ref 49–397)

## 2022-04-12 MED ORDER — PANTOPRAZOLE SODIUM 40 MG IV SOLR
40.0000 mg | Freq: Two times a day (BID) | INTRAVENOUS | Status: DC
  Administered 2022-04-12 – 2022-04-16 (×8): 40 mg via INTRAVENOUS
  Filled 2022-04-12 (×8): qty 10

## 2022-04-12 MED ORDER — ONDANSETRON HCL 4 MG/2ML IJ SOLN
4.0000 mg | Freq: Four times a day (QID) | INTRAMUSCULAR | Status: DC | PRN
  Administered 2022-04-12: 4 mg via INTRAVENOUS
  Filled 2022-04-12: qty 2

## 2022-04-12 NOTE — ED Triage Notes (Addendum)
Pt presents to ER via ems from home with ams.  Per ems, pt has been c/o visual hallucinations, elevated BP, and BIL lower extremity weakness x at least 24hrs. Ems states when they arrived to house, pts car was crooked in his garage. Pt is normally able to walk on his own, but has started to have to use a walker in last 24 hours.  Pt states he took some prescribed doxepin last night, which he has taken before.  Pt stopped taking trazodone appx 3 weeks ago.  Pt states he is having trouble remembering things, which is new for him. Pt is currently A&O x4 at this time in NAD in triage.    Stroke screen neg with ems, 20G IV LH.   Pt was seen by PCP today for same.

## 2022-04-12 NOTE — ED Provider Notes (Signed)
[facility] Provider Note    Event Date/Time   First MD Initiated Contact with Patient 04/12/22 2120     (approximate)   History   Chief Complaint Altered Mental Status   HPI  Devin Uzzle. is a 77 y.o. male with past medical history of hypertension, diabetes, CAD status post CABG, CHF, atrial fibrillation on Eliquis, and AAA who presents to the ED complaining of altered mental status.  Patient reports that he recently started taking doxepin for insomnia and after his first dose yesterday, began to feel quite weak with hallucinations.  He reports seeing things that are not there with some foggy headedness and confusion.  He has felt very unsteady on his feet when walking with shaking in both of his legs when he attempts to do so.  He reports a tremor at baseline but this has been unchanged with the new medication.  He denies any headache, fever, cough, chest pain, shortness of breath, nausea, vomiting, diarrhea, or dysuria.  He has never had similar symptoms in the past, denies other significant medication changes.     Physical Exam   Triage Vital Signs: ED Triage Vitals  Enc Vitals Group     BP 04/12/22 2106 (!) 211/112     Pulse Rate 04/12/22 2106 75     Resp 04/12/22 2106 15     Temp 04/12/22 2106 98.5 F (36.9 C)     Temp Source 04/12/22 2106 Oral     SpO2 04/12/22 2106 99 %     Weight 04/12/22 2112 202 lb (91.6 kg)     Height 04/12/22 2112 6' (1.829 m)     Head Circumference --      Peak Flow --      Pain Score 04/12/22 2111 0     Pain Loc --      Pain Edu? --      Excl. in [place]? --     Most recent vital signs: Vitals:   04/12/22 2250 04/12/22 2300  BP: (!) 190/95 (!) 196/89  Pulse: 78 73  Resp: 20 (!) 24  Temp:    SpO2: 93% 93%    Constitutional: Alert and oriented. Eyes: Conjunctivae are normal. Head: Atraumatic. Nose: No congestion/rhinnorhea. Mouth/Throat: Mucous membranes are moist.  Cardiovascular: Normal rate,  regular rhythm. Grossly normal heart sounds.  2+ radial pulses bilaterally. Respiratory: Normal respiratory effort.  No retractions. Lungs CTAB. Gastrointestinal: Soft and nontender. No distention. Musculoskeletal: No lower extremity tenderness nor edema.  Neurologic:  Normal speech and language. No gross focal neurologic deficits are appreciated.  Intermittent tremor noted.    ED Results / Procedures / Treatments   Labs (all labs ordered are listed, but only abnormal results are displayed) Labs Reviewed  CBC WITH DIFFERENTIAL/PLATELET - Abnormal; Notable for the following components:      Result Value   WBC 11.6 (*)    Neutro Abs 9.3 (*)    All other components within normal limits  COMPREHENSIVE METABOLIC PANEL - Abnormal; Notable for the following components:   Sodium 128 (*)    Chloride 95 (*)    Glucose, Bld 123 (*)    Creatinine, Ser 0.59 (*)    All other components within normal limits  URINALYSIS, ROUTINE W REFLEX MICROSCOPIC - Abnormal; Notable for the following components:   Color, Urine YELLOW (*)    APPearance CLEAR (*)    Protein, ur 100 (*)    All other components within normal limits  URINE DRUG  SCREEN, QUALITATIVE ([facility] ONLY)  MAGNESIUM  CK  TROPONIN I (HIGH SENSITIVITY)  TROPONIN I (HIGH SENSITIVITY)     EKG  ED ECG REPORT I, Blake Divine, the attending physician, personally viewed and interpreted this ECG.   Date: 04/12/2022  EKG Time: 21:04  Rate: 76  Rhythm: normal sinus rhythm  Axis: Normal  Intervals:first-degree A-V block   ST&T Change: None  RADIOLOGY CT head reviewed and interpreted by me with no hemorrhage or midline shift.  PROCEDURES:  Critical Care performed: No  Procedures   MEDICATIONS ORDERED IN ED: Medications - No data to display   IMPRESSION / MDM / [place] / ED COURSE  I reviewed the triage vital signs and the nursing notes.                              77 y.o. male with past medical history of  hypertension, diabetes, CAD status post CABG, atrial fibrillation on Eliquis, CHF, and AAA who presents to the ED with altered mental status including hallucinations, unsteady gait, and shaking in his lower extremities.  Patient's presentation is most consistent with acute presentation with potential threat to life or bodily function.  Differential diagnosis includes, but is not limited to, stroke, TIA, electrolyte abnormality, anemia, UTI, dementia, medication effect, serotonin syndrome.  Patient well-appearing and in no acute distress, vital signs are remarkable for hypertension but otherwise reassuring.  He has no focal neurologic deficits on exam but appears quite shaky in his lower extremities, no clonus noted.  Would still consider serotonin syndrome as this could be brought on by doxepin interacting with bupropion, would be consistent with his hypertension and tremors.  CT head is negative for acute process, labs remarkable for mild hyponatremia with no significant AKI, anemia, or leukocytosis.  LFTs and troponin are unremarkable.  We attempted to have patient ambulate here in the ED but he was unable to stand without assistance and is very unsteady on his feet.  He would benefit from admission for further workup, case discussed with hospitalist.      FINAL CLINICAL IMPRESSION(S) / ED DIAGNOSES   Final diagnoses:  Altered mental status, unspecified altered mental status type  Hallucinations  Unsteady gait  Hypertension, unspecified type     Rx / DC Orders   ED Discharge Orders     None        Note:  This document was prepared using Dragon voice recognition software and may include unintentional dictation errors.   Blake Divine, MD 04/12/22 [phone]

## 2022-04-12 NOTE — H&P (Addendum)
History and Physical    Chief Complaint: AMS   HISTORY OF PRESENT ILLNESS: Devin [name]. is an 77 y.o. male  brought by EMS for AMS and delirium that started last night. Per patient: Last night started getting delirious and seeing things on walls-does not remember what happened . No alcohol. Normally he is able to ambulate and ADL but since last night he has had to use the walker. As below patient has a history of extensive cardiac disease AAA aneurysm refer to "Eliquis.  While seeing the patient in the emergency room downstairs to admit him patient started to have retching and was nauseous and started to have vomiting vomitus negative for ED gross blood or coffee-ground. MRI could nto be done as pt has sleep device implanted d/w tech that we will decide in am if can do repeat head ct to compare.   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  Gastrointestinal:  Positive for nausea and vomiting.  Neurological:  Positive for headaches.  All other systems reviewed and are negative.  Allergies  Allergen Reactions   Ace Inhibitors Swelling   Morphine Swelling   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   Past Surgical History:  Procedure Laterality Date   APPENDECTOMY     BREAST SURGERY Right    benign mass   CARDIOVERSION Right  12/15/2019   Procedure: CARDIOVERSION;  Surgeon: Laurier NancyKhan, Shaukat A, MD;  Location: [facility] ORS;  Service: Cardiovascular;  Laterality: Right;   CATARACT EXTRACTION, BILATERAL     COLONOSCOPY     CORONARY ANGIOPLASTY WITH STENT PLACEMENT     CORONARY ARTERY BYPASS GRAFT  05/2019   LIMA-LAD   LEFT HEART CATH AND CORS/GRAFTS ANGIOGRAPHY N/A 05/21/2019   Procedure: LEFT HEART CATH AND CORONARY ANGIOGRAPHY;  Surgeon: Laurier NancyKhan, Shaukat A, MD;  Location: [facility] INV[facility]IVE [facility];  Service: Cardiovascular;  Laterality: N/A;   LUMBAR LAMINECTOMY/DECOMPRESSION MICRODISCECTOMY N/A 07/04/2020   Procedure: L4-5 LAMINECTOMY;  Surgeon: Lucy Chrisook, Steven, MD;  Location: [facility] ORS;  Service: Neurosurgery;  Laterality: N/A;   REPLACEMENT TOTAL KNEE BILATERAL         MEDICATIONS: Current Outpatient Medications  Medication Instructions   acetaminophen (TYLENOL) 1,000 mg, Oral, Every 6 hours PRN   amiodarone (PACERONE) 200 mg, Oral, Daily   apixaban (ELIQUIS) 5 mg, Oral, 2 times daily   atorvastatin (LIPITOR) 80 MG tablet Oral   benzonatate (TESSALON) 200 MG capsule Take by mouth.   buPROPion (WELLBUTRIN XL) 300 MG 24 hr tablet 1 tablet, Oral, Daily   carvedilol (COREG) 25 mg, Oral, 2 times daily with meals   Continuous Blood Gluc Receiver (FREESTYLE LIBRE 14 DAY READER) DEVI Use 1 Device as  directed   Doxepin HCl 3 MG TABS 1 tablet, Oral, Daily at bedtime   Emollient (CERAVE EX) 1 application , Apply externally, Daily PRN   ENTRESTO 97-103 MG 1 tablet, Oral, 2 times daily   EPINEPHrine (EPI-PEN) 0.3 mg, Intramuscular, As needed   ezetimibe (ZETIA) 10 mg, Oral, Daily   fluticasone (FLONASE) 50 MCG/ACT nasal spray SPRAY 2 SPRAYS INTO EACH NOSTRIL EVERY DAY   gabapentin (NEURONTIN) 300 MG capsule 1 capsule, Oral, 2 times daily   glucose blood (PRECISION QID TEST) test strip Check fasting sugar once daily E11.9   Lancets (ONETOUCH DELICA PLUS LANCET33G) MISC Topical, Daily   metFORMIN (GLUCOPHAGE) 1,000 mg, Oral, 2 times  daily   methocarbamol (ROBAXIN) 500 mg, Oral, Every 8 hours PRN   nitroGLYCERIN (NITROSTAT) 0.4 mg, Sublingual, Every 5 min PRN   ONETOUCH ULTRA test strip CHECK FASTING SUGAR ONCE DAILY E11.9   sertraline (ZOLOFT) 25 MG tablet 1 tablet, Oral, Daily   sildenafil (VIAGRA) 50 mg, Daily PRN   ED Course: Pt in Ed alert awake oriented having word finding difficulty, and hypertensive urgency. Vitals:   04/12/22 2300 04/12/22 2330 04/12/22 2345 04/13/22 0000  BP: (!) 196/89   (!) 177/98  Pulse: 73 72  75  Resp: (!) 24 20    Temp:      TempSrc:      SpO2: 93% 93% 95%   Weight:      Height:       No intake/output data recorded. SpO2: 95 % Blood work in ed shows: White count of 11.6, hyponatremia 128, normal electrolytes.  Normal urinalysis. Results for orders placed or performed during the hospital encounter of 04/12/22 (from the past 24 hour(s))  CBC with Differential     Status: Abnormal   Collection Time: 04/12/22  9:15 PM  Result Value Ref Range   WBC 11.6 (H) 4.0 - 10.5 K/uL   RBC 4.70 4.22 - 5.81 MIL/uL   Hemoglobin 14.3 13.0 - 17.0 g/dL   HCT 09.843.3 11.939.0 - 14.752.0 %   MCV 92.1 80.0 - 100.0 fL   MCH 30.4 26.0 - 34.0 pg   MCHC 33.0 30.0 - 36.0 g/dL   RDW 82.913.3 56.211.5 - 13.015.5 %   Platelets 320 150 - 400 K/uL   nRBC 0.0 0.0 - 0.2 %   Neutrophils Relative % 81 %   Neutro Abs 9.3 (H) 1.7 - 7.7 K/uL   Lymphocytes Relative 12 %   Lymphs Abs 1.4 0.7 - 4.0 K/uL   Monocytes Relative 7 %   Monocytes Absolute 0.8 0.1 - 1.0 K/uL   Eosinophils Relative 0 %   Eosinophils Absolute 0.1 0.0 - 0.5 K/uL   Basophils Relative 0 %   Basophils Absolute 0.0 0.0 - 0.1 K/uL   Immature Granulocytes 0 %   Abs Immature Granulocytes 0.05 0.00 - 0.07 K/uL  Comprehensive metabolic panel     Status: Abnormal   Collection Time: 04/12/22  9:15 PM  Result Value Ref Range   Sodium 128 (L) 135 - 145 mmol/L   Potassium 3.6 3.5 - 5.1 mmol/L   Chloride 95 (L) 98 - 111 mmol/L   CO2 24 22 - 32 mmol/L   Glucose, Bld  123 (H) 70 - 99 mg/dL   BUN 11 8 - 23 mg/dL   Creatinine, Ser 8.650.59 (L) 0.61 - 1.24 mg/dL   Calcium 9.1 8.9 - 78.410.3 mg/dL   Total Protein 7.3 6.5 - 8.1 g/dL   Albumin 4.3 3.5 - 5.0  g/dL   AST 30 15 - 41 U/L   ALT 31 0 - 44 U/L   Alkaline Phosphatase 55 38 - 126 U/L   Total Bilirubin 1.0 0.3 - 1.2 mg/dL   GFR, Estimated >49 >67 mL/min   Anion gap 9 5 - 15  Urinalysis, Routine w reflex microscopic -Urine, Clean Catch     Status: Abnormal   Collection Time: 04/12/22  9:15 PM  Result Value Ref Range   Color, Urine YELLOW (A) YELLOW   APPearance CLEAR (A) CLEAR   Specific Gravity, Urine 1.018 1.005 - 1.030   pH 6.0 5.0 - 8.0   Glucose, UA NEGATIVE NEGATIVE mg/dL   Hgb urine dipstick NEGATIVE NEGATIVE   Bilirubin Urine NEGATIVE NEGATIVE   Ketones, ur NEGATIVE NEGATIVE mg/dL   Protein, ur 591 (A) NEGATIVE mg/dL   Nitrite NEGATIVE NEGATIVE   Leukocytes,Ua NEGATIVE NEGATIVE   RBC / HPF 0-5 0 - 5 RBC/hpf   WBC, UA 0-5 0 - 5 WBC/hpf   Bacteria, UA NONE SEEN NONE SEEN   Squamous Epithelial / HPF 0-5 0 - 5 /HPF   Mucus PRESENT   Urine Drug Screen, Qualitative ([facility] only)     Status: None   Collection Time: 04/12/22  9:15 PM  Result Value Ref Range   Tricyclic, Ur Screen NONE DETECTED NONE DETECTED   Amphetamines, Ur Screen NONE DETECTED NONE DETECTED   MDMA (Ecstasy)Ur Screen NONE DETECTED NONE DETECTED   Cocaine Metabolite,Ur [place] NONE DETECTED NONE DETECTED   Opiate, Ur Screen NONE DETECTED NONE DETECTED   Phencyclidine (PCP) Ur S NONE DETECTED NONE DETECTED   Cannabinoid 50 Ng, Ur [place] NONE DETECTED NONE DETECTED   Barbiturates, Ur Screen NONE DETECTED NONE DETECTED   Benzodiazepine, Ur Scrn NONE DETECTED NONE DETECTED   Methadone Scn, Ur NONE DETECTED NONE DETECTED  Troponin I (High Sensitivity)     Status: None   Collection Time: 04/12/22  9:15 PM  Result Value Ref Range   Troponin I (High Sensitivity) 13 <18 ng/L  Magnesium     Status: Abnormal   Collection Time: 04/12/22  9:37 PM   Result Value Ref Range   Magnesium 1.5 (L) 1.7 - 2.4 mg/dL  CK     Status: None   Collection Time: 04/12/22  9:37 PM  Result Value Ref Range   Total CK 118 49 - 397 U/L  Troponin I (High Sensitivity)     Status: None   Collection Time: 04/12/22 11:27 PM  Result Value Ref Range   Troponin I (High Sensitivity) 13 <18 ng/L  Lipase, blood     Status: None   Collection Time: 04/12/22 11:27 PM  Result Value Ref Range   Lipase 30 11 - 51 U/L  Resp panel by RT-PCR (RSV, Flu A&B, Covid) Anterior Nasal Swab     Status: None   Collection Time: 04/13/22 12:25 AM   Specimen: Anterior Nasal Swab  Result Value Ref Range   SARS Coronavirus 2 by RT PCR NEGATIVE NEGATIVE   Influenza A by PCR NEGATIVE NEGATIVE   Influenza B by PCR NEGATIVE NEGATIVE   Resp Syncytial Virus by PCR NEGATIVE NEGATIVE  Gastric occult blood (1-card to lab)     Status: None   Collection Time: 04/13/22 12:25 AM  Result Value Ref Range   pH, Gastric 5 TO 7    Occult Blood, Gastric NEGATIVE NEGATIVE    Unresulted Labs (From admission, onward)     Start     Ordered  04/13/22 0500  Comprehensive metabolic panel  Tomorrow morning,   STAT        04/13/22 0120   04/13/22 0500  CBC  Tomorrow morning,   STAT        04/13/22 0120   04/13/22 0116  Hemoglobin A1c  Once,   URGENT       Comments: To assess prior glycemic control    04/13/22 0120   04/12/22 2337  Blood gas, venous  Add-on,   AD        04/12/22 2336           Pt has received : Orders Placed This Encounter  Procedures   Resp panel by RT-PCR (RSV, Flu A&B, Covid) Anterior Nasal Swab    Standing Status:   Standing    Number of Occurrences:   1   CT HEAD WO CONTRAST (5MM)    Standing Status:   Standing    Number of Occurrences:   1   MR BRAIN WO CONTRAST    Standing Status:   Standing    Number of Occurrences:   1    Order Specific Question:   What is the patient's sedation requirement?    Answer:   No Sedation    Order Specific Question:   Does the  patient have a pacemaker or implanted devices?    Answer:   No   CBC with Differential    Standing Status:   Standing    Number of Occurrences:   1   Comprehensive metabolic panel    Standing Status:   Standing    Number of Occurrences:   1   Urinalysis, Routine w reflex microscopic -Urine, Clean Catch    Standing Status:   Standing    Number of Occurrences:   1    Order Specific Question:   Specimen Source    Answer:   Urine, Clean Catch [76]   Urine Drug Screen, Qualitative ([facility] only)    Standing Status:   Standing    Number of Occurrences:   1   Magnesium    Standing Status:   Standing    Number of Occurrences:   1   CK    Standing Status:   Standing    Number of Occurrences:   1   Lipase, blood    Standing Status:   Standing    Number of Occurrences:   1   Blood gas, venous    Standing Status:   Standing    Number of Occurrences:   1   Hemoglobin A1c    To assess prior glycemic control    Standing Status:   Standing    Number of Occurrences:   1   Comprehensive metabolic panel    Standing Status:   Standing    Number of Occurrences:   1   CBC    Standing Status:   Standing    Number of Occurrences:   1   Diet NPO time specified Except for: Sips with Meds    Standing Status:   Standing    Number of Occurrences:   1    Order Specific Question:   Except for    Answer:   Sips with Meds   Apply Diabetes Mellitus Care Plan    Standing Status:   Standing    Number of Occurrences:   1   STAT CBG when hypoglycemia is suspected. If treated, recheck every 15 minutes after each treatment until CBG >/= 70 mg/dl  Standing Status:   Standing    Number of Occurrences:   1   Refer to Hypoglycemia Protocol Sidebar Report for treatment of CBG < 70 mg/dl    Standing Status:   Standing    Number of Occurrences:   1   Maintain IV access    Standing Status:   Standing    Number of Occurrences:   1   Vital signs    Standing Status:   Standing    Number of Occurrences:   1    Notify physician (specify)    Standing Status:   Standing    Number of Occurrences:   20    Order Specific Question:   Notify Physician    Answer:   for pulse less than 55 or greater than 120    Order Specific Question:   Notify Physician    Answer:   for respiratory rate less than 12 or greater than 25    Order Specific Question:   Notify Physician    Answer:   for temperature greater than 100.5 F    Order Specific Question:   Notify Physician    Answer:   for urinary output less than 30 mL/hr for four hours    Order Specific Question:   Notify Physician    Answer:   for systolic BP less than 90 or greater than 160, diastolic BP less than 60 or greater than 100    Order Specific Question:   Notify Physician    Answer:   for new hypoxia w/ oxygen saturations < 88%   Progressive Mobility Protocol: No Restrictions    Standing Status:   Standing    Number of Occurrences:   1   Daily weights    Standing Status:   Standing    Number of Occurrences:   1   Intake and Output    Standing Status:   Standing    Number of Occurrences:   1   Initiate Oral Care Protocol    Standing Status:   Standing    Number of Occurrences:   1   Initiate Carrier Fluid Protocol    Standing Status:   Standing    Number of Occurrences:   1   RN may order General Admission PRN Orders utilizing "General Admission PRN medications" (through manage orders) for the following patient needs: allergy symptoms (Claritin), cold sores (Carmex), cough (Robitussin DM), eye irritation (Liquifilm Tears), hemorrhoids (Tucks), indigestion (Maalox), minor skin irritation (Hydrocortisone Cream), muscle pain (Ben Gay), nose irritation (saline nasal spray) and sore throat (Chloraseptic spray).    Standing Status:   Standing    Number of Occurrences:   [id number]   Cardiac Monitoring Continuous x 48 hours Indications for use: Other; Other indications for use: acute delirium    Standing Status:   Standing    Number of Occurrences:   1     Order Specific Question:   Indications for use:    Answer:   Other    Order Specific Question:   Other indications for use:    Answer:   acute delirium   Full code    Standing Status:   Standing    Number of Occurrences:   1    Order Specific Question:   By:    Answer:   Other   Consult to hospitalist    Standing Status:   Standing    Number of Occurrences:   1    Order Specific Question:   Place  call to:    Answer:   [phone]    Order Specific Question:   Reason for Consult    Answer:   Admit   Airborne and Contact precautions    Standing Status:   Standing    Number of Occurrences:   1   Pulse oximetry check with vital signs    Standing Status:   Standing    Number of Occurrences:   1   Oxygen therapy Mode or (Route): Nasal cannula; Liters Per Minute: 2; Keep 02 saturation: greater than 92 %    Standing Status:   Standing    Number of Occurrences:   20    Order Specific Question:   Mode or (Route)    Answer:   Nasal cannula    Order Specific Question:   Liters Per Minute    Answer:   2    Order Specific Question:   Keep 02 saturation    Answer:   greater than 92 %   Gastric occult blood (1-card to lab)    Standing Status:   Standing    Number of Occurrences:   1   EKG 12-Lead    Standing Status:   Standing    Number of Occurrences:   1   EKG 12-Lead    Standing Status:   Standing    Number of Occurrences:   1   Place in observation (patient's expected length of stay will be less than 2 midnights)    Standing Status:   Standing    Number of Occurrences:   1    Order Specific Question:   Hospital Area    Answer:   [place] [[id number]]    Order Specific Question:   Level of Care    Answer:   Telemetry Cardiac [103]    Order Specific Question:   Covid Evaluation    Answer:   Asymptomatic - no recent exposure (last 10 days) testing not required    Order Specific Question:   Diagnosis    Answer:   Acute delirium [809983]    Order Specific Question:    Admitting Physician    Answer:   Cherylann Ratel    Order Specific Question:   Attending Physician    Answer:   Cherylann Ratel   Aspiration precautions    Standing Status:   Standing    Number of Occurrences:   1   Fall precautions    Standing Status:   Standing    Number of Occurrences:   1    Meds ordered this encounter  Medications   pantoprazole (PROTONIX) injection 40 mg   ondansetron (ZOFRAN) injection 4 mg   magnesium sulfate IVPB 1 g 100 mL   0.9 %  sodium chloride infusion   hydrALAZINE (APRESOLINE) injection 10 mg   apixaban (ELIQUIS) tablet 5 mg   atorvastatin (LIPITOR) tablet 80 mg   sertraline (ZOLOFT) tablet 25 mg   nitroGLYCERIN (NITROSTAT) SL tablet 0.4 mg   fluticasone (FLONASE) 50 MCG/ACT nasal spray 1 spray   ezetimibe (ZETIA) tablet 10 mg   sacubitril-valsartan (ENTRESTO) 97-103 mg per tablet    Order Specific Question:   ACE-inhibitors have NOT been administered in the past 36-hours.    Answer:   NOT APPLICABLE (continuing home med)   carvedilol (COREG) tablet 25 mg   buPROPion (WELLBUTRIN XL) 24 hr tablet 300 mg   amiodarone (PACERONE) tablet 200 mg   acetaminophen (TYLENOL) tablet 1,000 mg  gabapentin (NEURONTIN) capsule 300 mg   sodium chloride flush (NS) 0.9 % injection 3 mL   0.9 %  sodium chloride infusion   insulin aspart (novoLOG) injection 0-9 Units    Order Specific Question:   Correction coverage:    Answer:   Sensitive (thin, NPO, renal)    Order Specific Question:   CBG < 70:    Answer:   Implement Hypoglycemia Standing Orders and refer to Hypoglycemia Standing Orders sidebar report    Order Specific Question:   CBG 70 - 120:    Answer:   0 units    Order Specific Question:   CBG 121 - 150:    Answer:   1 unit    Order Specific Question:   CBG 151 - 200:    Answer:   2 units    Order Specific Question:   CBG 201 - 250:    Answer:   3 units    Order Specific Question:   CBG 251 - 300:    Answer:   5 units    Order  Specific Question:   CBG 301 - 350:    Answer:   7 units    Order Specific Question:   CBG 351 - 400    Answer:   9 units    Order Specific Question:   CBG > 400    Answer:   call MD and obtain STAT lab verification   insulin aspart (novoLOG) injection 0-5 Units    Order Specific Question:   Correction coverage:    Answer:   HS scale    Order Specific Question:   CBG < 70:    Answer:   Implement Hypoglycemia Standing Orders and refer to Hypoglycemia Standing Orders sidebar report    Order Specific Question:   CBG 70 - 120:    Answer:   0 units    Order Specific Question:   CBG 121 - 150:    Answer:   0 units    Order Specific Question:   CBG 151 - 200:    Answer:   0 units    Order Specific Question:   CBG 201 - 250:    Answer:   2 units    Order Specific Question:   CBG 251 - 300:    Answer:   3 units    Order Specific Question:   CBG 301 - 350:    Answer:   4 units    Order Specific Question:   CBG 351 - 400:    Answer:   5 units    Order Specific Question:   CBG > 400    Answer:   call MD and obtain STAT lab verification     Admission Imaging : CT HEAD WO CONTRAST (5MM)  Result Date: 04/12/2022 CLINICAL DATA:  Mental status change EXAM: CT HEAD WITHOUT CONTRAST TECHNIQUE: Contiguous axial images were obtained from the base of the skull through the vertex without intravenous contrast. RADIATION DOSE REDUCTION: This exam was performed according to the departmental dose-optimization program which includes automated exposure control, adjustment of the mA and/or kV according to patient size and/or use of iterative reconstruction technique. COMPARISON:  CT head 04/07/2020.  MRI head 08/11/2021. FINDINGS: Brain: No evidence of acute infarction, hemorrhage, hydrocephalus, extra-axial collection or mass lesion/mass effect. Again seen is mild diffuse atrophy and mild periventricular white matter hypodensity, likely chronic small vessel ischemic change. Vascular: Atherosclerotic calcifications  are present within the cavernous internal carotid arteries. Skull: Normal.  Negative for fracture or focal lesion. Sinuses/Orbits: No acute finding. Other: None. IMPRESSION: 1. No acute intracranial abnormality. 2. Mild diffuse atrophy and mild chronic small vessel ischemic change. Electronically Signed   By: Darliss Cheney M.D.   On: 04/12/2022 21:31   Physical Examination: Vitals:   04/12/22 2300 04/12/22 2330 04/12/22 2345 04/13/22 0000  BP: (!) 196/89   (!) 177/98  Pulse: 73 72  75  Temp:      Resp: (!) 24 20    Height:      Weight:      SpO2: 93% 93% 95%   TempSrc:      BMI (Calculated):       Physical Exam Vitals and nursing note reviewed.  Constitutional:      General: He is not in acute distress.    Appearance: He is ill-appearing. He is not toxic-appearing or diaphoretic.  HENT:     Head: Normocephalic and atraumatic.     Right Ear: Hearing and external ear normal.     Left Ear: Hearing and external ear normal.     Nose: Nose normal. No nasal deformity.     Mouth/Throat:     Lips: Pink.     Mouth: Mucous membranes are moist.     Tongue: No lesions.     Pharynx: Oropharynx is clear.  Eyes:     Extraocular Movements: Extraocular movements intact.     Pupils: Pupils are equal, round, and reactive to light.  Cardiovascular:     Rate and Rhythm: Normal rate and regular rhythm.     Pulses: Normal pulses.     Heart sounds: Normal heart sounds.  Pulmonary:     Effort: Pulmonary effort is normal.     Breath sounds: Normal breath sounds.  Abdominal:     General: Bowel sounds are normal. There is no distension.     Palpations: Abdomen is soft. There is no mass.     Tenderness: There is no abdominal tenderness. There is no guarding.     Hernia: No hernia is present.  Musculoskeletal:     Right lower leg: No edema.     Left lower leg: No edema.  Skin:    General: Skin is warm.  Neurological:     General: No focal deficit present.     Mental Status: He is alert and  oriented to person, place, and time.     Cranial Nerves: Cranial nerves 2-12 are intact.     Motor: Motor function is intact.  Psychiatric:        Attention and Perception: Attention normal.        Mood and Affect: Mood normal.        Speech: Speech normal.        Behavior: Behavior normal. Behavior is cooperative.        Cognition and Memory: Cognition normal.       Assessment and Plan: * Acute delirium D/d include medication related to his doxepin and or interaction with other medication. Viral panel for flu/covid/rsv pending.  Pt is hypoxic on my exam in ed.  Acute delirium > if this is related to CVA.    Nausea & vomiting Supportive care with prn hydralazine.  IV PPI and zofran.    Electrolyte abnormality Replace and follow electrolytes.   Gastroenteritis Prn antiemetics.  Zofran. MIVF.  IV PPI.   HTN (hypertension) Vitals:   04/12/22 2106 04/12/22 2250 04/12/22 2300 04/13/22 0000  BP: (!) 211/112 (!) 190/95 (!) 196/89 Marland Kitchen)  177/98  Hypertensive urgency.  Head ct negative. No neuro deficit.  We will continue statin/ coreg/ entresto  amiodarone 200 mg daily.     CAD (coronary artery disease) Stable.  Cont statin ,amiodarone, and eliquis.       DVT prophylaxis:  Eliquis.   Code Status:  Full code    Family Communication:  Medcalf,diane (Friend)  [phone]    Disposition Plan:  Home    Consults called:  None   Admission status: Inpatient.    Unit/ Expected LOS: Med tele/ 2 days.    Para Skeans MD [facility]  6 PM- 2 AM. Please contact me via secure Chat 6 PM-2 AM. [phone]( Pager ) To contact the [facility] Attending or Consulting provider [place] or covering provider during after hours [place], for this patient.   Check the care team in [facility] and look for a) attending/consulting [facility] provider listed and b) the [facility] team listed Log into [email or web address] and use [place]'s universal password to access. If you do not have the  password, please contact the hospital operator. Locate the [facility] provider you are looking for under [facility] and page to a number that you can be directly reached. If you still have difficulty reaching the provider, please page the [facility] (Director on Call) for the Hospitalists listed on amion for assistance. [email or web address] 04/13/2022, 1:23 AM

## 2022-04-12 NOTE — ED Notes (Signed)
Patient transported to CT 

## 2022-04-13 ENCOUNTER — Observation Stay: Payer: Medicare HMO

## 2022-04-13 DIAGNOSIS — R410 Disorientation, unspecified: Secondary | ICD-10-CM | POA: Diagnosis present

## 2022-04-13 DIAGNOSIS — E878 Other disorders of electrolyte and fluid balance, not elsewhere classified: Secondary | ICD-10-CM | POA: Diagnosis present

## 2022-04-13 DIAGNOSIS — R112 Nausea with vomiting, unspecified: Secondary | ICD-10-CM | POA: Diagnosis present

## 2022-04-13 LAB — COMPREHENSIVE METABOLIC PANEL
ALT: 30 U/L (ref 0–44)
AST: 30 U/L (ref 15–41)
Albumin: 3.9 g/dL (ref 3.5–5.0)
Alkaline Phosphatase: 50 U/L (ref 38–126)
Anion gap: 9 (ref 5–15)
BUN: 10 mg/dL (ref 8–23)
CO2: 22 mmol/L (ref 22–32)
Calcium: 8.6 mg/dL — ABNORMAL LOW (ref 8.9–10.3)
Chloride: 97 mmol/L — ABNORMAL LOW (ref 98–111)
Creatinine, Ser: 0.59 mg/dL — ABNORMAL LOW (ref 0.61–1.24)
GFR, Estimated: >60 mL/min (ref >60)
Glucose, Bld: 114 mg/dL — ABNORMAL HIGH (ref 70–99)
Potassium: 3.4 mmol/L — ABNORMAL LOW (ref 3.5–5.1)
Sodium: 128 mmol/L — ABNORMAL LOW (ref 135–145)
Total Bilirubin: 1.1 mg/dL (ref 0.3–1.2)
Total Protein: 6.6 g/dL (ref 6.5–8.1)

## 2022-04-13 LAB — CBC
HCT: 39.4 % (ref 39.0–52.0)
Hemoglobin: 13.3 g/dL (ref 13.0–17.0)
MCH: 30.4 pg (ref 26.0–34.0)
MCHC: 33.8 g/dL (ref 30.0–36.0)
MCV: 90 fL (ref 80.0–100.0)
Platelets: 296 10*3/uL (ref 150–400)
RBC: 4.38 MIL/uL (ref 4.22–5.81)
RDW: 13.3 % (ref 11.5–15.5)
WBC: 13.8 10*3/uL — ABNORMAL HIGH (ref 4.0–10.5)
nRBC: 0 % (ref 0.0–0.2)

## 2022-04-13 LAB — BASIC METABOLIC PANEL
Anion gap: 9 (ref 5–15)
BUN: 8 mg/dL (ref 8–23)
CO2: 22 mmol/L (ref 22–32)
Calcium: 8.1 mg/dL — ABNORMAL LOW (ref 8.9–10.3)
Chloride: 93 mmol/L — ABNORMAL LOW (ref 98–111)
Creatinine, Ser: 0.54 mg/dL — ABNORMAL LOW (ref 0.61–1.24)
GFR, Estimated: >60 mL/min (ref >60)
Glucose, Bld: 120 mg/dL — ABNORMAL HIGH (ref 70–99)
Potassium: 3.2 mmol/L — ABNORMAL LOW (ref 3.5–5.1)
Sodium: 124 mmol/L — ABNORMAL LOW (ref 135–145)

## 2022-04-13 LAB — RESP PANEL BY RT-PCR (RSV, FLU A&B, COVID)  RVPGX2
Influenza A by PCR: NEGATIVE
Influenza B by PCR: NEGATIVE
Resp Syncytial Virus by PCR: NEGATIVE
SARS Coronavirus 2 by RT PCR: NEGATIVE

## 2022-04-13 LAB — GLUCOSE, CAPILLARY: Glucose-Capillary: 116 mg/dL — ABNORMAL HIGH (ref 70–99)

## 2022-04-13 LAB — CBG MONITORING, ED
Glucose-Capillary: 166 mg/dL — ABNORMAL HIGH (ref 70–99)
Glucose-Capillary: 118 mg/dL — ABNORMAL HIGH (ref 70–99)
Glucose-Capillary: 108 mg/dL — ABNORMAL HIGH (ref 70–99)
Glucose-Capillary: 113 mg/dL — ABNORMAL HIGH (ref 70–99)

## 2022-04-13 LAB — BLOOD GAS, VENOUS
Acid-base deficit: 0.1 mmol/L (ref 0.0–2.0)
Bicarbonate: 23.9 mmol/L (ref 20.0–28.0)
O2 Saturation: 90.3 %
Patient temperature: 37
pCO2, Ven: 36 mmHg — ABNORMAL LOW (ref 44–60)
pH, Ven: 7.43 (ref 7.25–7.43)
pO2, Ven: 56 mmHg — ABNORMAL HIGH (ref 32–45)

## 2022-04-13 LAB — HEMOGLOBIN A1C
Hgb A1c MFr Bld: 5.5 % (ref 4.8–5.6)
Mean Plasma Glucose: 111.15 mg/dL

## 2022-04-13 LAB — GASTRIC OCCULT BLOOD (1-CARD TO LAB)
Occult Blood, Gastric: NEGATIVE
pH, Gastric: 5

## 2022-04-13 LAB — SODIUM: Sodium: 125 mmol/L — ABNORMAL LOW (ref 135–145)

## 2022-04-13 MED ORDER — INSULIN ASPART 100 UNIT/ML IJ SOLN
0.0000 [IU] | Freq: Three times a day (TID) | INTRAMUSCULAR | Status: DC
  Administered 2022-04-14: 2 [IU] via SUBCUTANEOUS
  Filled 2022-04-13 (×4): qty 1

## 2022-04-13 MED ORDER — MAGNESIUM SULFATE IN D5W 1-5 GM/100ML-% IV SOLN
1.0000 g | Freq: Once | INTRAVENOUS | Status: AC
  Administered 2022-04-13: 1 g via INTRAVENOUS
  Filled 2022-04-13: qty 100

## 2022-04-13 MED ORDER — AMIODARONE HCL 200 MG PO TABS
200.0000 mg | ORAL_TABLET | Freq: Every day | ORAL | Status: DC
  Administered 2022-04-13 – 2022-04-16 (×4): 200 mg via ORAL
  Filled 2022-04-13 (×4): qty 1

## 2022-04-13 MED ORDER — CARVEDILOL 25 MG PO TABS
25.0000 mg | ORAL_TABLET | Freq: Two times a day (BID) | ORAL | Status: DC
  Administered 2022-04-13 – 2022-04-16 (×7): 25 mg via ORAL
  Filled 2022-04-13 (×7): qty 1

## 2022-04-13 MED ORDER — ATORVASTATIN CALCIUM 80 MG PO TABS
80.0000 mg | ORAL_TABLET | Freq: Every day | ORAL | Status: DC
  Administered 2022-04-13 – 2022-04-16 (×4): 80 mg via ORAL
  Filled 2022-04-13 (×2): qty 1
  Filled 2022-04-13: qty 4
  Filled 2022-04-13: qty 1

## 2022-04-13 MED ORDER — HYDRALAZINE HCL 20 MG/ML IJ SOLN
10.0000 mg | Freq: Once | INTRAMUSCULAR | Status: AC
  Administered 2022-04-13: 10 mg via INTRAVENOUS
  Filled 2022-04-13: qty 1

## 2022-04-13 MED ORDER — APIXABAN 5 MG PO TABS
5.0000 mg | ORAL_TABLET | Freq: Two times a day (BID) | ORAL | Status: DC
  Administered 2022-04-13 – 2022-04-16 (×7): 5 mg via ORAL
  Filled 2022-04-13 (×7): qty 1

## 2022-04-13 MED ORDER — SERTRALINE HCL 50 MG PO TABS
25.0000 mg | ORAL_TABLET | Freq: Every day | ORAL | Status: DC
  Administered 2022-04-13 – 2022-04-16 (×4): 25 mg via ORAL
  Filled 2022-04-13 (×4): qty 1

## 2022-04-13 MED ORDER — FLUTICASONE PROPIONATE 50 MCG/ACT NA SUSP
1.0000 | Freq: Every day | NASAL | Status: DC
  Filled 2022-04-13: qty 16

## 2022-04-13 MED ORDER — SODIUM CHLORIDE 0.9% FLUSH
3.0000 mL | Freq: Two times a day (BID) | INTRAVENOUS | Status: DC
  Administered 2022-04-13 – 2022-04-16 (×8): 3 mL via INTRAVENOUS

## 2022-04-13 MED ORDER — INSULIN ASPART 100 UNIT/ML IJ SOLN: 0.0000 [IU] | Freq: Every day | INTRAMUSCULAR | Status: DC

## 2022-04-13 MED ORDER — SACUBITRIL-VALSARTAN 97-103 MG PO TABS
1.0000 | ORAL_TABLET | Freq: Two times a day (BID) | ORAL | Status: DC
  Administered 2022-04-13 – 2022-04-16 (×6): 1 via ORAL
  Filled 2022-04-13 (×8): qty 1

## 2022-04-13 MED ORDER — BUPROPION HCL ER (XL) 150 MG PO TB24
300.0000 mg | ORAL_TABLET | Freq: Every day | ORAL | Status: DC
  Administered 2022-04-13 – 2022-04-16 (×4): 300 mg via ORAL
  Filled 2022-04-13 (×4): qty 2

## 2022-04-13 MED ORDER — SODIUM CHLORIDE 1 G PO TABS
2.0000 g | ORAL_TABLET | Freq: Two times a day (BID) | ORAL | Status: AC
  Administered 2022-04-13 – 2022-04-15 (×4): 2 g via ORAL
  Filled 2022-04-13 (×5): qty 2

## 2022-04-13 MED ORDER — EZETIMIBE 10 MG PO TABS
10.0000 mg | ORAL_TABLET | Freq: Every day | ORAL | Status: DC
  Administered 2022-04-13 – 2022-04-16 (×4): 10 mg via ORAL
  Filled 2022-04-13 (×5): qty 1

## 2022-04-13 MED ORDER — SODIUM CHLORIDE 0.9 % IV SOLN: INTRAVENOUS | Status: DC

## 2022-04-13 MED ORDER — SODIUM CHLORIDE 1 G PO TABS: 1.0000 g | ORAL_TABLET | Freq: Two times a day (BID) | ORAL | Status: DC

## 2022-04-13 MED ORDER — ACETAMINOPHEN 500 MG PO TABS: 1000.0000 mg | ORAL_TABLET | Freq: Four times a day (QID) | ORAL | Status: DC | PRN

## 2022-04-13 MED ORDER — GABAPENTIN 100 MG PO CAPS
200.0000 mg | ORAL_CAPSULE | Freq: Two times a day (BID) | ORAL | Status: DC
  Administered 2022-04-13 – 2022-04-16 (×7): 200 mg via ORAL
  Filled 2022-04-13 (×7): qty 2

## 2022-04-13 MED ORDER — NITROGLYCERIN 0.4 MG SL SUBL: 0.4000 mg | SUBLINGUAL_TABLET | SUBLINGUAL | Status: DC | PRN

## 2022-04-13 NOTE — ED Notes (Signed)
MRI called to speak with the patient and ask questions.  The patient has a neural stimulator in place,  and he must have his remote control to place it in MRI safe mode.  Patient will not be able to have his MRI until his remote is retrieved.  Dr. Posey Pronto has been informed.  Patient family can bring it tomorrow.

## 2022-04-13 NOTE — Assessment & Plan Note (Signed)
Vitals:   04/12/22 2106 04/12/22 2250 04/12/22 2300 04/13/22 0000  BP: (!) 211/112 (!) 190/95 (!) 196/89 (!) 177/98  Hypertensive urgency.  Head ct negative. No neuro deficit.  We will continue statin/ coreg/ entresto  amiodarone 200 mg daily.

## 2022-04-13 NOTE — Assessment & Plan Note (Signed)
Stable.  Cont statin ,amiodarone, and eliquis.

## 2022-04-13 NOTE — ED Notes (Signed)
Dr Nevada Crane contacted again to address patient's NPO status after patient was on the call light asking for for.

## 2022-04-13 NOTE — Assessment & Plan Note (Signed)
Replace and follow electrolytes.

## 2022-04-13 NOTE — Progress Notes (Addendum)
PROGRESS NOTE  Devin [name]. VVO:[id number] DOB: [birth year] DOA: 04/12/2022 PCP: Dione Housekeeper, MD  HPI/Recap of past 24 hours: Devin Gowda. is a 77 y.o. male with past medical history of hypertension, diabetes, CAD status post CABG, CHF, atrial fibrillation on Eliquis, and AAA who presents to the ED complaining of altered mental status.  Patient reports that he recently started taking doxepin for insomnia and after his first dose yesterday, began to feel quite weak with hallucinations.  He reports seeing things that are not there with some foggy headedness and confusion.  He has felt very unsteady on his feet when walking with shaking in both of his legs when he attempts to do so.  He reports a tremor at baseline but this has been unchanged with the new medication.  He denies any headache, fever, cough, chest pain, shortness of breath, nausea, vomiting, diarrhea, or dysuria.  He has never had similar symptoms in the past, denies other significant medication changes.   04/13/22:  Seen and examined in the ED.  Alert and oriented x3.  Asked to eat.  Later when evaluated by OT, was having difficulty ambulating.  MRI is pending.  He needs his remote control for his neurospinal stimulator prior to MRI.  Assessment/Plan: Principal Problem:   Acute delirium Active Problems:   CAD (coronary artery disease)   HTN (hypertension)   Gastroenteritis   Electrolyte abnormality   Nausea & vomiting  Acute metabolic encephalopathy, unclear etiology Possibly drug reaction with doxepin MRI brain pending He needs his remote control for his neurospinal stimulator prior to MRI. Reorient as needed  Euvolemic hyponatremia Serum sodium 128 on admission. Repeated 125 Received IV fluids on admission, which was stopped on 04/13/2022 due to pulmonary edema seen on chest x-ray. Start salt tablet 2 g twice daily x 2 days Repeat BMP every 4 hours x 3, avoid rapid correction, no more than 8 mEq  increase in 24 hours.  Pulmonary edema, suspect from volume overload IV fluid DC'd on 04/13/2022 Not hypoxic Mobilize as tolerated Incentive spirometer Closely monitor volume status and diurese as needed  Leukocytosis UA negative for pyuria Chest x-ray bilateral pulmonary edema. Repeat CBC  Ambulatory dysfunction Assessment OT Recommended SNF placement TOC consult  Coronary artery disease Chronic diastolic CHF Ischemic cardiomyopathy Resume home cardiac medications Denies any anginal symptoms.  Paroxysmal A-fib Resume home Eliquis On amiodarone and Coreg for rate control  Hyperlipidemia Resume home regimen  Hypertension Resume home regimen Monitor vital signs  Chronic anxiety/depression Resume home regimen   Code Status: Full code  Family Communication: None at bedside  Disposition Plan: Admitted to telemetry cardiac unit   Consultants: None.  Procedures: None.  Antimicrobials: None.  DVT prophylaxis: Eliquis.  Status is: Observation     Objective: Vitals:   04/13/22 1130 04/13/22 1200 04/13/22 1300 04/13/22 1400  BP: (!) 174/117 (!) 167/85 (!) 150/95 137/79  Pulse: 73 72 70 69  Resp: (!) 21 19    Temp:      TempSrc:      SpO2: 94% 95% 91% 93%  Weight:      Height:        Intake/Output Summary (Last 24 hours) at 04/13/2022 1540 Last data filed at 04/13/2022 1204 Gross per 24 hour  Intake 5.9 ml  Output --  Net 5.9 ml   Filed Weights   04/12/22 2112  Weight: 91.6 kg    Exam:  General: 77 y.o. year-old male well developed well nourished  in no acute distress.  Alert and oriented x3. Cardiovascular: Regular rate and rhythm with no rubs or gallops.  No thyromegaly or JVD noted.   Respiratory: Mild rales at bases with no wheezing noted.  Poor inspiratory effort. Abdomen: Soft nontender nondistended with normal bowel sounds x4 quadrants. Musculoskeletal: No lower extremity edema. 2/4 pulses in all 4 extremities. Skin: No ulcerative  lesions noted or rashes, Psychiatry: Mood is appropriate for condition and setting   Data Reviewed: CBC: Recent Labs  Lab 04/12/22 2115 04/13/22 0312  WBC 11.6* 13.8*  NEUTROABS 9.3*  --   HGB 14.3 13.3  HCT 43.3 39.4  MCV 92.1 90.0  PLT 320 169   Basic Metabolic Panel: Recent Labs  Lab 04/12/22 2115 04/12/22 2137 04/13/22 0553 04/13/22 1218  NA 128*  --  128* 125*  K 3.6  --  3.4*  --   CL 95*  --  97*  --   CO2 24  --  22  --   GLUCOSE 123*  --  114*  --   BUN 11  --  10  --   CREATININE 0.59*  --  0.59*  --   CALCIUM 9.1  --  8.6*  --   MG  --  1.5*  --   --    GFR: Estimated Creatinine Clearance: 83.5 mL/min (A) (by C-G formula based on SCr of 0.59 mg/dL (L)). Liver Function Tests: Recent Labs  Lab 04/12/22 2115 04/13/22 0553  AST 30 30  ALT 31 30  ALKPHOS 55 50  BILITOT 1.0 1.1  PROT 7.3 6.6  ALBUMIN 4.3 3.9   Recent Labs  Lab 04/12/22 2327  LIPASE 30   No results for input(s): "AMMONIA" in the last 168 hours. Coagulation Profile: No results for input(s): "INR", "PROTIME" in the last 168 hours. Cardiac Enzymes: Recent Labs  Lab 04/12/22 2137  CKTOTAL 118   BNP (last 3 results) No results for input(s): "PROBNP" in the last 8760 hours. HbA1C: Recent Labs    04/13/22 0312  HGBA1C 5.5   CBG: Recent Labs  Lab 04/13/22 0316 04/13/22 0924 04/13/22 1209  GLUCAP 166* 118* 108*   Lipid Profile: No results for input(s): "CHOL", "HDL", "LDLCALC", "TRIG", "CHOLHDL", "LDLDIRECT" in the last 72 hours. Thyroid Function Tests: No results for input(s): "TSH", "T4TOTAL", "FREET4", "T3FREE", "THYROIDAB" in the last 72 hours. Anemia Panel: No results for input(s): "VITAMINB12", "FOLATE", "FERRITIN", "TIBC", "IRON", "RETICCTPCT" in the last 72 hours. Urine analysis:    Component Value Date/Time   COLORURINE YELLOW (A) 04/12/2022 2115   APPEARANCEUR CLEAR (A) 04/12/2022 2115   LABSPEC 1.018 04/12/2022 2115   PHURINE 6.0 04/12/2022 2115    GLUCOSEU NEGATIVE 04/12/2022 2115   HGBUR NEGATIVE 04/12/2022 2115   BILIRUBINUR NEGATIVE 04/12/2022 2115   Devin [name] NEGATIVE 04/12/2022 2115   PROTEINUR 100 (A) 04/12/2022 2115   NITRITE NEGATIVE 04/12/2022 2115   LEUKOCYTESUR NEGATIVE 04/12/2022 2115   Sepsis Labs: @LABRCNTIP (procalcitonin:4,lacticidven:4)  ) Recent Results (from the past 240 hour(s))  Resp panel by RT-PCR (RSV, Flu A&B, Covid) Anterior Nasal Swab     Status: None   Collection Time: 04/13/22 12:25 AM   Specimen: Anterior Nasal Swab  Result Value Ref Range Status   SARS Coronavirus 2 by RT PCR NEGATIVE NEGATIVE Final    Comment: (NOTE) SARS-CoV-2 target nucleic acids are NOT DETECTED.  The SARS-CoV-2 RNA is generally detectable in upper respiratory specimens during the acute phase of infection. The lowest concentration of SARS-CoV-2 viral copies this assay  can detect is 138 copies/mL. A negative result does not preclude SARS-Cov-2 infection and should not be used as the sole basis for treatment or other patient management decisions. A negative result may occur with  improper specimen collection/handling, submission of specimen other than nasopharyngeal swab, presence of viral mutation(s) within the areas targeted by this assay, and inadequate number of viral copies(<138 copies/mL). A negative result must be combined with clinical observations, patient history, and epidemiological information. The expected result is Negative.  Fact Sheet for Patients:  EntrepreneurPulse.com.au  Fact Sheet for Healthcare Providers:  IncredibleEmployment.be  This test is no t yet approved or cleared by the [place] FDA and  has been authorized for detection and/or diagnosis of SARS-CoV-2 by FDA under an Emergency Use Authorization (EUA). This EUA will remain  in effect (meaning this test can be used) for the duration of the COVID-19 declaration under Section 564(b)(1) of the Act,  21 U.S.C.section 360bbb-3(b)(1), unless the authorization is terminated  or revoked sooner.       Influenza A by PCR NEGATIVE NEGATIVE Final   Influenza B by PCR NEGATIVE NEGATIVE Final    Comment: (NOTE) The Xpert Xpress SARS-CoV-2/FLU/RSV plus assay is intended as an aid in the diagnosis of influenza from Nasopharyngeal swab specimens and should not be used as a sole basis for treatment. Nasal washings and aspirates are unacceptable for Xpert Xpress SARS-CoV-2/FLU/RSV testing.  Fact Sheet for Patients: EntrepreneurPulse.com.au  Fact Sheet for Healthcare Providers: IncredibleEmployment.be  This test is not yet approved or cleared by the [place] FDA and has been authorized for detection and/or diagnosis of SARS-CoV-2 by FDA under an Emergency Use Authorization (EUA). This EUA will remain in effect (meaning this test can be used) for the duration of the COVID-19 declaration under Section 564(b)(1) of the Act, 21 U.S.C. section 360bbb-3(b)(1), unless the authorization is terminated or revoked.     Resp Syncytial Virus by PCR NEGATIVE NEGATIVE Final    Comment: (NOTE) Fact Sheet for Patients: EntrepreneurPulse.com.au  Fact Sheet for Healthcare Providers: IncredibleEmployment.be  This test is not yet approved or cleared by the [place] FDA and has been authorized for detection and/or diagnosis of SARS-CoV-2 by FDA under an Emergency Use Authorization (EUA). This EUA will remain in effect (meaning this test can be used) for the duration of the COVID-19 declaration under Section 564(b)(1) of the Act, 21 U.S.C. section 360bbb-3(b)(1), unless the authorization is terminated or revoked.  Performed at [facility], [address], [place], [place] [address]       Studies: [facility] Chest Monument 1 View  Result Date: 04/13/2022 CLINICAL DATA:  Leukocytosis. EXAM: PORTABLE CHEST 1 VIEW  COMPARISON:  April 07, 2020. FINDINGS: Mild cardiomegaly with mild central pulmonary vascular congestion is noted. Mild bibasilar pulmonary edema is noted. Sternotomy wires are noted. Bony thorax is unremarkable. IMPRESSION: Mild cardiomegaly with mild central pulmonary vascular congestion and bilateral pulmonary edema. Electronically Signed   By: Marijo Conception M.D.   On: 04/13/2022 08:10   CT HEAD WO CONTRAST (5MM)  Result Date: 04/12/2022 CLINICAL DATA:  Mental status change EXAM: CT HEAD WITHOUT CONTRAST TECHNIQUE: Contiguous axial images were obtained from the base of the skull through the vertex without intravenous contrast. RADIATION DOSE REDUCTION: This exam was performed according to the departmental dose-optimization program which includes automated exposure control, adjustment of the mA and/or kV according to patient size and/or use of iterative reconstruction technique. COMPARISON:  CT head 04/07/2020.  MRI head 08/11/2021. FINDINGS: Brain:  No evidence of acute infarction, hemorrhage, hydrocephalus, extra-axial collection or mass lesion/mass effect. Again seen is mild diffuse atrophy and mild periventricular white matter hypodensity, likely chronic small vessel ischemic change. Vascular: Atherosclerotic calcifications are present within the cavernous internal carotid arteries. Skull: Normal. Negative for fracture or focal lesion. Sinuses/Orbits: No acute finding. Other: None. IMPRESSION: 1. No acute intracranial abnormality. 2. Mild diffuse atrophy and mild chronic small vessel ischemic change. Electronically Signed   By: Ronney Asters M.D.   On: 04/12/2022 21:31    Scheduled Meds:  amiodarone  200 mg Oral Daily   apixaban  5 mg Oral BID   atorvastatin  80 mg Oral Daily   buPROPion  300 mg Oral Daily   carvedilol  25 mg Oral BID WC   ezetimibe  10 mg Oral Daily   [START ON 04/16/2022] fluticasone  1 spray Each Nare Daily   gabapentin  200 mg Oral BID   insulin aspart  0-5 Units  Subcutaneous QHS   insulin aspart  0-9 Units Subcutaneous TID WC   pantoprazole (PROTONIX) IV  40 mg Intravenous Q12H   sacubitril-valsartan  1 tablet Oral BID   sertraline  25 mg Oral Daily   sodium chloride flush  3 mL Intravenous Q12H    Continuous Infusions:   LOS: 0 days     Kayleen Memos, MD [facility] Pager [phone]  If 7PM-7AM, please contact night-coverage [email or web address] Password [facility] 04/13/2022, 3:40 PM

## 2022-04-13 NOTE — ED Notes (Signed)
Male purewick applied to Pt, Pt informed that he does not have to get up to urinate. Pt verbalizes understanding. Pt is resting in bed at this time.

## 2022-04-13 NOTE — Evaluation (Signed)
Occupational Therapy Evaluation Patient Details Name: Devin [name] [facility]. MRN: [id number] DOB: [birth year] Today's Date: 04/13/2022   History of Present Illness Bryce Cheever. is a 77 y.o. male with past medical history of hypertension, diabetes, CAD status post CABG, CHF, atrial fibrillation on Eliquis, and AAA who presents to the ED complaining of altered mental status.   Clinical Impression   Mr. Flanagan was seen for OT evaluation this date. Pt is questionable historian however information regarding PLOF/home set up verified by chart. Pt reports that prior to hospital admission, pt was generally independent with ADL management. He reports friends assist him with IADLs including house cleaning and yard work. He also endorses 4+ falls in the last 6 months. Pt lives alone in a multi-level home but is able to live on the main level. Pt presents to acute OT demonstrating impaired ADL performance and functional mobility 2/2 generalized weakness, decreased activity tolerance, and impaired cognition (See OT problem list). Pt currently requires MOD A to come to sitting at EOB, MOD A for LB ADL Management from STS. Pt would benefit from skilled OT services to address noted impairments and functional limitations (see below for any additional details) in order to maximize safety and independence while minimizing falls risk and caregiver burden. Upon hospital discharge, recommend STR to maximize pt safety and return to PLOF.        Recommendations for follow up therapy are one component of a multi-disciplinary discharge planning process, led by the attending physician.  Recommendations may be updated based on patient status, additional functional criteria and insurance authorization.   Follow Up Recommendations  Skilled nursing-short term rehab (<3 hours/day)     Assistance Recommended at Discharge Frequent or constant Supervision/Assistance  Patient can return home with the following A lot of  help with walking and/or transfers;A lot of help with bathing/dressing/bathroom;Assistance with cooking/housework;Help with stairs or ramp for entrance;Assist for transportation    Functional Status Assessment  Patient has had a recent decline in their functional status and demonstrates the ability to make significant improvements in function in a reasonable and predictable amount of time.  Equipment Recommendations  BSC/3in1    Recommendations for Other Services       Precautions / Restrictions Precautions Precautions: Fall Precaution Comments: tele-sitter Restrictions Weight Bearing Restrictions: No      Mobility Bed Mobility Overal bed mobility: Needs Assistance Bed Mobility: Supine to Sit     Supine to sit: Mod assist, HOB elevated          Transfers                   General transfer comment: deferred. Pt returns to supine from sitting EOB endorsing increased R hip pain.      Balance Overall balance assessment: Needs assistance Sitting-balance support: Bilateral upper extremity supported, Feet unsupported Sitting balance-Leahy Scale: Poor Sitting balance - Comments: Pt demos poor sitting balance and endurance 2/2 R hip pain                                   ADL either performed or assessed with clinical judgement   ADL Overall ADL's : Needs assistance/impaired                                       General ADL Comments: Pt requires  MOD A to come to sit at edge of gurney bed, then declines further functional activity/mobility attempts 2/2 increased hip pain/fatigue. Anticipate at least MOD A for LB ADL management from STS. SET UP/SUPERVISION for seated/bed level UB ADL management.     Vision Baseline Vision/History: 1 Wears glasses Ability to See in Adequate Light: 1 Impaired Patient Visual Report: Blurring of vision Additional Comments: Pt endorses seeing objects on wall that are not there. See cognition section. Also  endorses increased blurry vision. Pt noted with R eye closing intermittently during session. L eye remains open appropriately. Formal assessment limited by cognition.     Perception     Praxis      Pertinent Vitals/Pain Pain Assessment Pain Assessment: Faces Faces Pain Scale: Hurts even more Pain Location: R hip with attempted mobility Pain Descriptors / Indicators: Aching, Sore, Guarding, Grimacing Pain Intervention(s): Limited activity within patient's tolerance, Monitored during session, Repositioned     Hand Dominance Right   Extremity/Trunk Assessment Upper Extremity Assessment Upper Extremity Assessment: Generalized weakness   Lower Extremity Assessment Lower Extremity Assessment: Generalized weakness;RLE deficits/detail RLE Deficits / Details: c/o of increased R hip pain with mobility states he was supposed to have a hip replacement in the next week.       Communication Communication Communication: No difficulties   Cognition Arousal/Alertness: Awake/alert Behavior During Therapy: WFL for tasks assessed/performed Overall Cognitive Status: No family/caregiver present to determine baseline cognitive functioning                                 General Comments: Pt oriented to self only, tele-sitter present and in view of pt at start/end of session. He is noted to have difficulty with following VCs, but provides information regarding PLOF/Home set up that is consistent with information in EMR. He endorses seeing "artistic things" on the blank wall of his ED room. Seems to be aware objects are not real.     General Comments       Exercises Other Exercises Other Exercises: Pt educated on role of OT in acute setting, falls prevention strategies, bed mobility techniques, and DC recs. Education limited 2/2 cognition, pt would benefit from further reinforcement.   Shoulder Instructions      Home Living Family/patient expects to be discharged to:: Private  residence Living Arrangements: Alone Available Help at Discharge: Available PRN/intermittently;Friend(s) Type of Home: House Home Access: Stairs to enter [employer] of Steps: 1 Entrance Stairs-Rails: None Home Layout: Two level;Able to live on main level with bedroom/bathroom     Bathroom Shower/Tub: Walk-in shower;Tub only   Bathroom Toilet: Standard     Home Equipment: Cane - single point;Rollator (4 wheels)          Prior Functioning/Environment Prior Level of Function : Independent/Modified Independent;Driving             Mobility Comments: Uses SPC for functional mobility, endroses at least 4 falls in last 6 months. Pt is questionable historian, however, information regarding PLOF aligns with information available in chart. ADLs Comments: Friends assist with yardwork, house cleaning, etc.        OT Problem List: Decreased strength;Decreased coordination;Pain;Decreased activity tolerance;Decreased range of motion;Decreased safety awareness;Decreased cognition;Impaired balance (sitting and/or standing);Decreased knowledge of use of DME or AE;Cardiopulmonary status limiting activity      OT Treatment/Interventions: Self-care/ADL training;Therapeutic exercise;Therapeutic activities;DME and/or AE instruction;Patient/family education;Balance training;Energy conservation    OT Goals(Current goals can be found in  the care plan section) Acute Rehab OT Goals Patient Stated Goal: To go home OT Goal Formulation: With patient Time For Goal Achievement: 04/27/22 Potential to Achieve Goals: Good ADL Goals Pt Will Perform Grooming: sitting;with supervision;with set-up Pt Will Perform Lower Body Dressing: sit to/from stand;with min assist;with adaptive equipment Pt Will Transfer to Toilet: bedside commode;with supervision;with set-up;stand pivot transfer Pt Will Perform Toileting - Clothing Manipulation and hygiene: sit to/from stand;with supervision;with set-up;with  adaptive equipment  OT Frequency: Min 2X/week    Co-evaluation              AM-PAC OT "6 Clicks" Daily Activity     Outcome Measure Help from another person eating meals?: A Little Help from another person taking care of personal grooming?: A Little Help from another person toileting, which includes using toliet, bedpan, or urinal?: A Lot Help from another person bathing (including washing, rinsing, drying)?: A Lot Help from another person to put on and taking off regular upper body clothing?: A Little Help from another person to put on and taking off regular lower body clothing?: A Lot 6 Click Score: 15   End of Session Equipment Utilized During Treatment: Gait belt;Rolling walker (2 wheels)  Activity Tolerance: Patient limited by pain;Other (comment) (cognition) Patient left: in bed;with call bell/phone within reach;with bed alarm set;Other (comment) (with tele sitter in room)  OT Visit Diagnosis: Other abnormalities of gait and mobility (R26.89);Muscle weakness (generalized) (M62.81);Pain Pain - Right/Left: Right Pain - part of body: Hip                Time: 1430-1453 OT Time Calculation (min): 23 min Charges:  OT General Charges $OT Visit: 1 Visit OT Evaluation $OT Eval Moderate Complexity: 1 Mod OT Treatments $Self Care/Home Management : 8-22 mins  Shara Blazing, M.S., OTR/L 04/13/22, 3:27 PM

## 2022-04-13 NOTE — Assessment & Plan Note (Signed)
Prn antiemetics.  Zofran. MIVF.  IV PPI.

## 2022-04-13 NOTE — Assessment & Plan Note (Addendum)
D/d include medication related to his doxepin and or interaction with other medication. Viral panel for flu/covid/rsv pending.  Pt is hypoxic on my exam in ed.  Acute delirium > if this is related to CVA.

## 2022-04-13 NOTE — Assessment & Plan Note (Signed)
Supportive care with prn hydralazine.  IV PPI and zofran.

## 2022-04-13 NOTE — ED Notes (Signed)
Patient reconnected to cardiac monitor after patient removed leads. Patient informed why the leans and monitor was important in his care. Patient verbalized understanding.

## 2022-04-13 NOTE — ED Notes (Signed)
Patient given lunch tray and repositioned. Patient denies other needs at this time.

## 2022-04-13 NOTE — ED Notes (Signed)
Patient on the call light requesting food. Patient informed him that the MD still does not want him to have food. Patient verbalized understanding.

## 2022-04-13 NOTE — ED Notes (Signed)
Patient rounded on and updated on plan of care. Patient requesting food. Patient informed that the admitting MD has him "Nothing by mouth" Patient informed that the RN has reached out to see if the order has been change. Pateint verbalized understanding. Patient denies other needs at this time.

## 2022-04-14 DIAGNOSIS — I160 Hypertensive urgency: Secondary | ICD-10-CM | POA: Diagnosis present

## 2022-04-14 DIAGNOSIS — E785 Hyperlipidemia, unspecified: Secondary | ICD-10-CM | POA: Diagnosis present

## 2022-04-14 DIAGNOSIS — I10 Essential (primary) hypertension: Secondary | ICD-10-CM | POA: Diagnosis not present

## 2022-04-14 DIAGNOSIS — R2681 Unsteadiness on feet: Secondary | ICD-10-CM | POA: Diagnosis not present

## 2022-04-14 DIAGNOSIS — M1990 Unspecified osteoarthritis, unspecified site: Secondary | ICD-10-CM | POA: Diagnosis present

## 2022-04-14 DIAGNOSIS — F419 Anxiety disorder, unspecified: Secondary | ICD-10-CM | POA: Diagnosis present

## 2022-04-14 DIAGNOSIS — E876 Hypokalemia: Secondary | ICD-10-CM | POA: Diagnosis present

## 2022-04-14 DIAGNOSIS — R443 Hallucinations, unspecified: Secondary | ICD-10-CM | POA: Diagnosis present

## 2022-04-14 DIAGNOSIS — Z1152 Encounter for screening for COVID-19: Secondary | ICD-10-CM | POA: Diagnosis not present

## 2022-04-14 DIAGNOSIS — I714 Abdominal aortic aneurysm, without rupture, unspecified: Secondary | ICD-10-CM | POA: Diagnosis present

## 2022-04-14 DIAGNOSIS — R262 Difficulty in walking, not elsewhere classified: Secondary | ICD-10-CM | POA: Diagnosis present

## 2022-04-14 DIAGNOSIS — K529 Noninfective gastroenteritis and colitis, unspecified: Secondary | ICD-10-CM | POA: Diagnosis present

## 2022-04-14 DIAGNOSIS — I480 Paroxysmal atrial fibrillation: Secondary | ICD-10-CM | POA: Diagnosis present

## 2022-04-14 DIAGNOSIS — R0902 Hypoxemia: Secondary | ICD-10-CM | POA: Diagnosis present

## 2022-04-14 DIAGNOSIS — I5032 Chronic diastolic (congestive) heart failure: Secondary | ICD-10-CM | POA: Diagnosis present

## 2022-04-14 DIAGNOSIS — I252 Old myocardial infarction: Secondary | ICD-10-CM | POA: Diagnosis not present

## 2022-04-14 DIAGNOSIS — F32A Depression, unspecified: Secondary | ICD-10-CM | POA: Diagnosis present

## 2022-04-14 DIAGNOSIS — F19921 Other psychoactive substance use, unspecified with intoxication with delirium: Secondary | ICD-10-CM | POA: Diagnosis present

## 2022-04-14 DIAGNOSIS — E871 Hypo-osmolality and hyponatremia: Secondary | ICD-10-CM | POA: Diagnosis present

## 2022-04-14 DIAGNOSIS — I700 Atherosclerosis of aorta: Secondary | ICD-10-CM | POA: Diagnosis present

## 2022-04-14 DIAGNOSIS — I110 Hypertensive heart disease with heart failure: Secondary | ICD-10-CM | POA: Diagnosis present

## 2022-04-14 DIAGNOSIS — E1142 Type 2 diabetes mellitus with diabetic polyneuropathy: Secondary | ICD-10-CM | POA: Diagnosis present

## 2022-04-14 DIAGNOSIS — I255 Ischemic cardiomyopathy: Secondary | ICD-10-CM | POA: Diagnosis present

## 2022-04-14 DIAGNOSIS — R4182 Altered mental status, unspecified: Secondary | ICD-10-CM | POA: Diagnosis not present

## 2022-04-14 DIAGNOSIS — R112 Nausea with vomiting, unspecified: Secondary | ICD-10-CM | POA: Diagnosis present

## 2022-04-14 DIAGNOSIS — G4700 Insomnia, unspecified: Secondary | ICD-10-CM | POA: Diagnosis present

## 2022-04-14 DIAGNOSIS — R410 Disorientation, unspecified: Secondary | ICD-10-CM | POA: Diagnosis not present

## 2022-04-14 DIAGNOSIS — I2510 Atherosclerotic heart disease of native coronary artery without angina pectoris: Secondary | ICD-10-CM | POA: Diagnosis present

## 2022-04-14 LAB — BASIC METABOLIC PANEL
Anion gap: 7 (ref 5–15)
BUN: 7 mg/dL — ABNORMAL LOW (ref 8–23)
CO2: 27 mmol/L (ref 22–32)
Calcium: 8.5 mg/dL — ABNORMAL LOW (ref 8.9–10.3)
Chloride: 93 mmol/L — ABNORMAL LOW (ref 98–111)
Creatinine, Ser: 0.63 mg/dL (ref 0.61–1.24)
GFR, Estimated: >60 mL/min (ref >60)
Glucose, Bld: 105 mg/dL — ABNORMAL HIGH (ref 70–99)
Potassium: 3.2 mmol/L — ABNORMAL LOW (ref 3.5–5.1)
Sodium: 127 mmol/L — ABNORMAL LOW (ref 135–145)

## 2022-04-14 LAB — GLUCOSE, CAPILLARY
Glucose-Capillary: 103 mg/dL — ABNORMAL HIGH (ref 70–99)
Glucose-Capillary: 165 mg/dL — ABNORMAL HIGH (ref 70–99)
Glucose-Capillary: 93 mg/dL (ref 70–99)
Glucose-Capillary: 131 mg/dL — ABNORMAL HIGH (ref 70–99)

## 2022-04-14 MED ORDER — POTASSIUM CHLORIDE 20 MEQ PO PACK
60.0000 meq | PACK | Freq: Once | ORAL | Status: AC
  Administered 2022-04-14: 60 meq via ORAL
  Filled 2022-04-14: qty 3

## 2022-04-14 NOTE — Progress Notes (Signed)
OT Cancellation Note  Patient Details Name: Devin [name] [facility]. MRN: [id number] DOB: [birth year]   Cancelled Treatment:    Reason Eval/Treat Not Completed: Other (comment). Duplicate OT order received. Pt already on caseload. Please see OT evaluation from 04/13/22. Will continue to follow acutely.   Lanelle Bal  [facility] 04/14/2022, 12:40 PM

## 2022-04-14 NOTE — Progress Notes (Addendum)
Devin [name]  PROGRESS NOTE  Devin [name]. TGG:[id number] DOB: Sep 05, 1945 DOA: 04/12/2022 PCP: Devin Housekeeper, MD  HPI/Recap of past 24 hours: Devin [name]. is a 77 y.o. male with past medical history of hypertension, diabetes, CAD status post CABG, CHF, atrial fibrillation on Eliquis, and AAA who presents to the ED complaining of altered mental status.  Patient reports that he recently started taking doxepin for insomnia and after his first dose yesterday, began to feel quite weak with hallucinations.  He reports seeing things that are not there with some foggy headedness and confusion.  He has felt very unsteady on his feet when walking with shaking in both of his legs when he attempts to do so.  He reports a tremor at baseline but this has been unchanged with the new medication.  He denies any headache, fever, cough, chest pain, shortness of breath, nausea, vomiting, diarrhea, or dysuria.  He has never had similar symptoms in the past, denies other significant medication changes.   04/13/22:  Seen and examined in the ED.  Alert and oriented x3.  Asked to eat.  Later when evaluated by OT, was having difficulty ambulating.  MRI is pending.  He needs his remote control for his neurospinal stimulator prior to MRI.  04/14/2022 : Seen and examined at bedside.  Patient feeling better AOx3.  No overnight events.   Patient persistently hyponatremic 127 improved from yesterday target correction  of 8 mEq per 24 hours.  And hypokalemic with potassium of 3.2 repleted follow-up with BMP in the morning.  Evaluated by OT recommendation is SNF.  Assessment/Plan: Principal Problem:   Acute delirium Active Problems:   CAD (coronary artery disease)   HTN (hypertension)   Gastroenteritis   Electrolyte abnormality   Nausea & vomiting  Acute metabolic encephalopathy, unclear etiology-  with diffuse body weakness likely from medication vs hyponatremia.  Possibly drug reaction with doxepin vs  electrolyte abnormality. Hyponatemia.  He needs his remote control for his neurospinal stimulator Reorient as needed  Euvolemic hyponatremia : 128-125-124-127( today) Serum sodium 128 on admission. Repeated 125 Received IV fluids on admission, which was stopped on 04/13/2022 due to pulmonary edema seen on chest x-ray. Start salt tablet 2 g twice daily x 2 days Repeat BMP every 4 hours x 3, avoid rapid correction, no more than 8 mEq increase in 24 hours.  Pulmonary edema, suspect from volume overload IV fluid DC'd on 04/13/2022 Not hypoxic Mobilize as tolerated Incentive spirometer Closely monitor volume status and diurese as needed  Leukocytosis UA negative for pyuria Chest x-ray bilateral pulmonary edema. Repeat CBC  Ambulatory dysfunction Assessment OT Recommended SNF placement TOC consult  Coronary artery disease Chronic diastolic CHF Ischemic cardiomyopathy Resume home cardiac medications Denies any anginal symptoms.  Paroxysmal A-fib Resume home Eliquis On amiodarone and Coreg for rate control  Hyperlipidemia Resume home regimen  Hypertension Resume home regimen Monitor vital signs  Chronic anxiety/depression Resume home regimen   Code Status: Full code  Family Communication: None at bedside  Disposition Plan: Admitted to telemetry cardiac unit   Consultants: None.  Procedures: None.  Antimicrobials: None.  DVT prophylaxis: Eliquis.  Status is: Observation     Objective: Vitals:   04/14/22 0442 04/14/22 0500 04/14/22 0820 04/14/22 1216  BP: (!) 152/82  (!) 150/65 125/71  Pulse: 69  70 (!) 57  Resp: 18  18 19   Temp: 98.2 F (36.8 C)  98.1 F (36.7 C) 98 F (36.7 C)  TempSrc:  SpO2: 95%  95% 94%  Weight:  92.2 kg    Height:        Intake/Output Summary (Last 24 hours) at 04/14/2022 1335 Last data filed at 04/14/2022 5643 Gross per 24 hour  Intake --  Output 2100 ml  Net -2100 ml   Filed Weights   04/12/22 2112 04/14/22  0500  Weight: 91.6 kg 92.2 kg    Exam:  General: 77 y.o. year-old male well developed well nourished in no acute distress.  Alert and oriented x3. Cardiovascular: Regular rate and rhythm with no rubs or gallops.  No thyromegaly or JVD noted.   Respiratory: Mild rales at bases with no wheezing noted.  Poor inspiratory effort. Abdomen: Soft nontender nondistended with normal bowel sounds x4 quadrants. Musculoskeletal: No lower extremity edema. 2/4 pulses in all 4 extremities. Skin: No ulcerative lesions noted or rashes, Psychiatry: Mood is appropriate for condition and setting   Data Reviewed: CBC: Recent Labs  Lab 04/12/22 2115 04/13/22 0312  WBC 11.6* 13.8*  NEUTROABS 9.3*  --   HGB 14.3 13.3  HCT 43.3 39.4  MCV 92.1 90.0  PLT 320 329   Basic Metabolic Panel: Recent Labs  Lab 04/12/22 2115 04/12/22 2137 04/13/22 0553 04/13/22 1218 04/13/22 2151 04/14/22 0154  NA 128*  --  128* 125* 124* 127*  K 3.6  --  3.4*  --  3.2* 3.2*  CL 95*  --  97*  --  93* 93*  CO2 24  --  22  --  22 27  GLUCOSE 123*  --  114*  --  120* 105*  BUN 11  --  10  --  8 7*  CREATININE 0.59*  --  0.59*  --  0.54* 0.63  CALCIUM 9.1  --  8.6*  --  8.1* 8.5*  MG  --  1.5*  --   --   --   --    GFR: Estimated Creatinine Clearance: 83.5 mL/min (by C-G formula based on SCr of 0.63 mg/dL). Liver Function Tests: Recent Labs  Lab 04/12/22 2115 04/13/22 0553  AST 30 30  ALT 31 30  ALKPHOS 55 50  BILITOT 1.0 1.1  PROT 7.3 6.6  ALBUMIN 4.3 3.9   Recent Labs  Lab 04/12/22 2327  LIPASE 30   No results for input(s): "AMMONIA" in the last 168 hours. Coagulation Profile: No results for input(s): "INR", "PROTIME" in the last 168 hours. Cardiac Enzymes: Recent Labs  Lab 04/12/22 2137  CKTOTAL 118   BNP (last 3 results) No results for input(s): "PROBNP" in the last 8760 hours. HbA1C: Recent Labs    04/13/22 0312  HGBA1C 5.5   CBG: Recent Labs  Lab 04/13/22 1209 04/13/22 1609  04/13/22 2121 04/14/22 0817 04/14/22 1215  GLUCAP 108* 113* 116* 103* 165*   Lipid Profile: No results for input(s): "CHOL", "HDL", "LDLCALC", "TRIG", "CHOLHDL", "LDLDIRECT" in the last 72 hours. Thyroid Function Tests: No results for input(s): "TSH", "T4TOTAL", "FREET4", "T3FREE", "THYROIDAB" in the last 72 hours. Anemia Panel: No results for input(s): "VITAMINB12", "FOLATE", "FERRITIN", "TIBC", "IRON", "RETICCTPCT" in the last 72 hours. Urine analysis:    Component Value Date/Time   COLORURINE YELLOW (A) 04/12/2022 2115   APPEARANCEUR CLEAR (A) 04/12/2022 2115   LABSPEC 1.018 04/12/2022 2115   PHURINE 6.0 04/12/2022 2115   GLUCOSEU NEGATIVE 04/12/2022 2115   HGBUR NEGATIVE 04/12/2022 2115   BILIRUBINUR NEGATIVE 04/12/2022 2115   KETONESUR NEGATIVE 04/12/2022 2115   PROTEINUR 100 (A) 04/12/2022 2115  NITRITE NEGATIVE 04/12/2022 2115   LEUKOCYTESUR NEGATIVE 04/12/2022 2115   Sepsis Labs: @LABRCNTIP (procalcitonin:4,lacticidven:4)  ) Recent Results (from the past 240 hour(s))  Resp panel by RT-PCR (RSV, Flu A&B, Covid) Anterior Nasal Swab     Status: None   Collection Time: 04/13/22 12:25 AM   Specimen: Anterior Nasal Swab  Result Value Ref Range Status   SARS Coronavirus 2 by RT PCR NEGATIVE NEGATIVE Final    Comment: (NOTE) SARS-CoV-2 target nucleic acids are NOT DETECTED.  The SARS-CoV-2 RNA is generally detectable in upper respiratory specimens during the acute phase of infection. The lowest concentration of SARS-CoV-2 viral copies this assay can detect is 138 copies/mL. A negative result does not preclude SARS-Cov-2 infection and should not be used as the sole basis for treatment or other patient management decisions. A negative result may occur with  improper specimen collection/handling, submission of specimen other than nasopharyngeal swab, presence of viral mutation(s) within the areas targeted by this assay, and inadequate number of viral copies(<138  copies/mL). A negative result must be combined with clinical observations, patient history, and epidemiological information. The expected result is Negative.  Fact Sheet for Patients:  EntrepreneurPulse.com.au  Fact Sheet for Healthcare Providers:  IncredibleEmployment.be  This test is no t yet approved or cleared by the [place] FDA and  has been authorized for detection and/or diagnosis of SARS-CoV-2 by FDA under an Emergency Use Authorization (EUA). This EUA will remain  in effect (meaning this test can be used) for the duration of the COVID-19 declaration under Section 564(b)(1) of the Act, 21 U.S.C.section 360bbb-3(b)(1), unless the authorization is terminated  or revoked sooner.       Influenza A by PCR NEGATIVE NEGATIVE Final   Influenza B by PCR NEGATIVE NEGATIVE Final    Comment: (NOTE) The Xpert Xpress SARS-CoV-2/FLU/RSV plus assay is intended as an aid in the diagnosis of influenza from Nasopharyngeal swab specimens and should not be used as a sole basis for treatment. Nasal washings and aspirates are unacceptable for Xpert Xpress SARS-CoV-2/FLU/RSV testing.  Fact Sheet for Patients: EntrepreneurPulse.com.au  Fact Sheet for Healthcare Providers: IncredibleEmployment.be  This test is not yet approved or cleared by the [place] FDA and has been authorized for detection and/or diagnosis of SARS-CoV-2 by FDA under an Emergency Use Authorization (EUA). This EUA will remain in effect (meaning this test can be used) for the duration of the COVID-19 declaration under Section 564(b)(1) of the Act, 21 U.S.C. section 360bbb-3(b)(1), unless the authorization is terminated or revoked.     Resp Syncytial Virus by PCR NEGATIVE NEGATIVE Final    Comment: (NOTE) Fact Sheet for Patients: EntrepreneurPulse.com.au  Fact Sheet for Healthcare  Providers: IncredibleEmployment.be  This test is not yet approved or cleared by the [place] FDA and has been authorized for detection and/or diagnosis of SARS-CoV-2 by FDA under an Emergency Use Authorization (EUA). This EUA will remain in effect (meaning this test can be used) for the duration of the COVID-19 declaration under Section 564(b)(1) of the Act, 21 U.S.C. section 360bbb-3(b)(1), unless the authorization is terminated or revoked.  Performed at [facility], [address], [place], [place] [address]       Studies: No results found.  Scheduled Meds:  amiodarone  200 mg Oral Daily   apixaban  5 mg Oral BID   atorvastatin  80 mg Oral Daily   buPROPion  300 mg Oral Daily   carvedilol  25 mg Oral BID WC   ezetimibe  10  mg Oral Daily   [START ON 04/16/2022] fluticasone  1 spray Each Nare Daily   gabapentin  200 mg Oral BID   insulin aspart  0-5 Units Subcutaneous QHS   insulin aspart  0-9 Units Subcutaneous TID WC   pantoprazole (PROTONIX) IV  40 mg Intravenous Q12H   sacubitril-valsartan  1 tablet Oral BID   sertraline  25 mg Oral Daily   sodium chloride flush  3 mL Intravenous Q12H   sodium chloride  2 g Oral BID WC    Continuous Infusions:   LOS: 0 days     Oran Rein, MD [facility] Pager [phone]  If 7PM-7AM, please contact night-coverage [email or web address] Password [email or web address] 04/14/2022, 1:35 PM

## 2022-04-14 NOTE — Evaluation (Signed)
Physical Therapy Evaluation Patient Details Name: Devin [name] [facility]. MRN: [id number] DOB: February 10, 1946 Today's Date: 04/14/2022  History of Present Illness  Devin [name]. is a 77 y.o. male with past medical history of hypertension, diabetes, CAD status post CABG, CHF, atrial fibrillation on Eliquis, and AAA who presents to the ED complaining of altered mental status.   Clinical Impression  Patient received in reclined position in bed. He is agreeable to PT evaluation. Patient oriented to self, situation, and location but thought it was still January.  Patient continues to demonstrate decreased cognition with uncertainty about some of the history provided. However, it matches fairly well with what has been previously recorded in chart. He reports he still sees lines on the wall clock and ceiling tiles that he believes are not there. He states that prior to hospitalization he lived alone in a 2 story home with one step to enter and is able to live on the main floor. He reports 4-6 falls in the last 6 months. He states he ambulated with no AD but later said he ambulated with [facility] prior to hospitalization. He reports being I with all aspects of care and mobility except gets help from neighbors for housework and yardwork. Upon PT eval, patient required extended time/effort and cuing to min A to got supine <> sit with log roll technique on each side. He required min A-mod A for sit <> stand transfers with improving independence with repetition and with lowering of walker height to improve UE support. He ambulated 50 feet with min A +2 for safety with second person following closely with chair due to risk of B knee buckling. Patient required step by step cuing for correct body and assistive device positioning and to remember how to effectively move body. He demonstrates posterior bias that requires min A to prevent backwards falling. Patient demonstrates a significant decline in functional mobility and  needs short term rehab prior to returning home at this time. Patient would benefit from skilled physical therapy to address impairments and functional limitations (see PT Problem List below) to work towards stated goals and return to PLOF or maximal functional independence.      Recommendations for follow up therapy are one component of a multi-disciplinary discharge planning process, led by the attending physician.  Recommendations may be updated based on patient status, additional functional criteria and insurance authorization.  Follow Up Recommendations Skilled nursing-short term rehab (<3 hours/day) Can patient physically be transported by private vehicle: No    Assistance Recommended at Discharge Frequent or constant Supervision/Assistance  Patient can return home with the following  Two people to help with walking and/or transfers;Assistance with cooking/housework;Assist for transportation;Two people to help with bathing/dressing/bathroom;Help with stairs or ramp for entrance    Equipment Recommendations Rolling walker (2 wheels);BSC/3in1  Recommendations for Other Services       Functional Status Assessment Patient has had a recent decline in their functional status and demonstrates the ability to make significant improvements in function in a reasonable and predictable amount of time.     Precautions / Restrictions Precautions Precautions: Fall Restrictions Weight Bearing Restrictions: No      Mobility  Bed Mobility Overal bed mobility: Needs Assistance Bed Mobility: Supine to Sit, Sit to Supine     Supine to sit: Min assist, Supervision Sit to supine: Min assist, Supervision   General bed mobility comments: Patient required assistance with moving blankets and at trunk when coming to sit on right side of bed. He  was able to do sit <> stand at left side of bed with increased time and effort.    Transfers Overall transfer level: Needs assistance   Transfers: Sit  to/from Stand Sit to Stand: Min assist, Mod assist           General transfer comment: Patient practiced sit <> stand three times, twice from edge of bed ending in chair and one in front of chair with RW and mod-min A. Patient had posterior bias and required cuing each time to shift weight forwards. RW too high at first and transfer improved with lower walker height. Patient able to transfer bed to chair with sit <> stand using min A and shuffling small steps. Demo difficulty maintaining forward weight and keeping knees from buckling.    Ambulation/Gait Ambulation/Gait assistance: Min assist, +2 safety/equipment Gait Distance (Feet): 50 Feet Assistive device: Rolling walker (2 wheels) Gait Pattern/deviations: Decreased stance time - right, Decreased step length - left Gait velocity: very slow     General Gait Details: Pateint ambulated approx 50 feet with RW, minA +2 with 2nd person providing very close chair follow due to risk for B knee buckling. Pateint demo short steps with decreased R LE stance time and left stride length. Patient requires min A to prevent LOB backwards.  Stairs            Wheelchair Mobility    Modified Rankin (Stroke Patients Only)       Balance Overall balance assessment: Needs assistance Sitting-balance support: Bilateral upper extremity supported, Feet supported Sitting balance-Leahy Scale: Fair Sitting balance - Comments: Patient able to sit at edge of bed with distant supervision, at risk for leaning/falling backwards. Postural control: Posterior lean   Standing balance-Leahy Scale: Poor Standing balance comment: Patient dependent on B UE support on RW and min A from PT to maintain standing without falling backwards due to posterior bias.                             Pertinent Vitals/Pain Pain Assessment Pain Assessment: No/denies pain    Home Living Family/patient expects to be discharged to:: Private residence Living  Arrangements: Alone Available Help at Discharge: Available PRN/intermittently;Friend(s) Type of Home: House Home Access: Stairs to enter Entrance Stairs-Rails: None Entrance Stairs-Number of Steps: 1   Home Layout: Two level;Able to live on main level with bedroom/bathroom Home Equipment: Cane - single point;Rollator (4 wheels);BSC/3in1;Grab bars - toilet Additional Comments: airdyne bike, no shower seat, unsure if he still has a rolling walker    Prior Function Prior Level of Function : Independent/Modified Independent;Driving             Mobility Comments: Uses SPC for functional mobility, endroses at least 4-6 falls in last 6 months. Pt is questionable historian, however, information regarding PLOF aligns with information available in chart. ADLs Comments: Friends assist with yardwork, house cleaning, etc.     Hand Dominance   Dominant Hand: Right    Extremity/Trunk Assessment   Upper Extremity Assessment Upper Extremity Assessment: Generalized weakness    Lower Extremity Assessment Lower Extremity Assessment: Generalized weakness;RLE deficits/detail;LLE deficits/detail (Patient demonstrates posterior lean and instabilty at B knees with standing. High risk for B knee buckling but improved with RW lowered to improve UE support.) RLE Coordination: decreased gross motor LLE Coordination: decreased gross motor    Cervical / Trunk Assessment Cervical / Trunk Assessment: Normal (hx of back surgery and back pain)  Communication  Communication: No difficulties  Cognition Arousal/Alertness: Awake/alert Behavior During Therapy: WFL for tasks assessed/performed Overall Cognitive Status: Impaired/Different from baseline                                 General Comments: Pt oriented to self, situation, location. He has date wrong by a month and is forgetful about history. He confirms most of history consistent with chart. He reports he is going to get a R THA in the  future. He reports seeling lines on the clock and the ceiling tiles that he doesn't think are actually there.        General Comments General comments (skin integrity, edema, etc.): Vitals remained WFL throughout session.    Exercises Other Exercises Other Exercises: Patient eduated on role of PT in acute care setting, discharge reccomendations. Reoriented to time situation as needed. Educated on correct positioning and techniques for mobility.   Assessment/Plan    PT Assessment Patient needs continued PT services  PT Problem List Decreased strength;Decreased balance;Decreased cognition;Decreased knowledge of precautions;Decreased range of motion;Decreased mobility;Decreased knowledge of use of DME;Decreased activity tolerance;Decreased coordination;Decreased safety awareness       PT Treatment Interventions DME instruction;Functional mobility training;Balance training;Patient/family education;Gait training;Therapeutic activities;Neuromuscular re-education;Stair training;Therapeutic exercise;Cognitive remediation    PT Goals (Current goals can be found in the Care Plan section)  Acute Rehab PT Goals Patient Stated Goal: to get better and go home PT Goal Formulation: With patient Time For Goal Achievement: 07/07/22 Potential to Achieve Goals: Good    Frequency Min 2X/week     Co-evaluation               AM-PAC PT "6 Clicks" Mobility  Outcome Measure Help needed turning from your back to your side while in a flat bed without using bedrails?: A Little Help needed moving from lying on your back to sitting on the side of a flat bed without using bedrails?: A Little Help needed moving to and from a bed to a chair (including a wheelchair)?: A Lot Help needed standing up from a chair using your arms (e.g., wheelchair or bedside chair)?: A Lot Help needed to walk in hospital room?: A Lot Help needed climbing 3-5 steps with a railing? : Total 6 Click Score: 13    End of  Session Equipment Utilized During Treatment: Gait belt Activity Tolerance: Patient tolerated treatment well;Patient limited by fatigue Patient left: in chair;with chair alarm set;with nursing/sitter in room;with call bell/phone within reach Nurse Communication: Mobility status PT Visit Diagnosis: Difficulty in walking, not elsewhere classified (R26.2);Muscle weakness (generalized) (M62.81)    Time: 1324-4010 PT Time Calculation (min) (ACUTE ONLY): 43 min   Charges:   PT Evaluation $PT Eval Moderate Complexity: 1 Mod PT Treatments $Gait Training: 8-22 mins        Everlean Alstrom. Graylon Good, PT, DPT 04/14/22, 12:38 PM

## 2022-04-15 LAB — BASIC METABOLIC PANEL
Anion gap: 6 (ref 5–15)
BUN: 11 mg/dL (ref 8–23)
CO2: 25 mmol/L (ref 22–32)
Calcium: 8.3 mg/dL — ABNORMAL LOW (ref 8.9–10.3)
Chloride: 106 mmol/L (ref 98–111)
Creatinine, Ser: 0.75 mg/dL (ref 0.61–1.24)
GFR, Estimated: >60 mL/min (ref >60)
Glucose, Bld: 138 mg/dL — ABNORMAL HIGH (ref 70–99)
Potassium: 3.5 mmol/L (ref 3.5–5.1)
Sodium: 137 mmol/L (ref 135–145)

## 2022-04-15 LAB — GLUCOSE, CAPILLARY
Glucose-Capillary: 112 mg/dL — ABNORMAL HIGH (ref 70–99)
Glucose-Capillary: 120 mg/dL — ABNORMAL HIGH (ref 70–99)
Glucose-Capillary: 147 mg/dL — ABNORMAL HIGH (ref 70–99)
Glucose-Capillary: 128 mg/dL — ABNORMAL HIGH (ref 70–99)
Glucose-Capillary: 123 mg/dL — ABNORMAL HIGH (ref 70–99)

## 2022-04-15 LAB — MAGNESIUM: Magnesium: 2.1 mg/dL (ref 1.7–2.4)

## 2022-04-15 NOTE — Progress Notes (Signed)
..  PROGRESS NOTE  Devin [name]. WUJ:[id number] DOB: 09-30-1945 DOA: 04/12/2022 PCP: Dione Housekeeper, MD  HPI/Recap of past 24 hours: Devin [name]. is a 77 y.o. male with past medical history of hypertension, diabetes, CAD status post CABG, CHF, atrial fibrillation on Eliquis, and AAA who presents to the ED complaining of altered mental status.  Patient reports that he recently started taking doxepin for insomnia and after his first dose yesterday, began to feel quite weak with hallucinations.  He reports seeing things that are not there with some foggy headedness and confusion.  He has felt very unsteady on his feet when walking with shaking in both of his legs when he attempts to do so.  He reports a tremor at baseline but this has been unchanged with the new medication.  He denies any headache, fever, cough, chest pain, shortness of breath, nausea, vomiting, diarrhea, or dysuria.  He has never had similar symptoms in the past, denies other significant medication changes.   04/13/22:  Seen and examined in the ED.  Alert and oriented x3.  Asked to eat.  Later when evaluated by OT, was having difficulty ambulating.  MRI is pending.  He needs his remote control for his neurospinal stimulator prior to MRI.  04/14/2022 : Seen and examined at bedside.  Patient feeling better AOx3.  No overnight events.   Patient persistently hyponatremic 127 improved from yesterday target correction  of 8 mEq per 24 hours.  And hypokalemic with potassium of 3.2 repleted follow-up with BMP in the morning.  Evaluated by OT recommendation is SNF.  04/15/2022 : Patient seen and examined this morning.  Back to his baseline.  Feeling better AOx3.  No overnight events.  Hyponatremia resolved.  Evaluated by PT recommendation is short-term rehab.  Dispo pending rehab placement now  Assessment/Plan: Principal Problem:   Acute delirium Active Problems:   CAD (coronary artery disease)   HTN (hypertension)    Gastroenteritis   Electrolyte abnormality   Nausea & vomiting  Acute metabolic encephalopathy, unclear etiology-  with diffuse body weakness likely from medication vs hyponatremia.  Possibly drug reaction with doxepin vs electrolyte abnormality. Hyponatemia.  He needs his remote control for his neurospinal stimulator Reorient as needed  Euvolemic hyponatremia : 128-125-124-127-137( today) Serum sodium 128 on admission. Repeated 125 Received IV fluids on admission, which was stopped on 04/13/2022 due to pulmonary edema seen on chest x-ray. Start salt tablet 2 g twice daily x 2 days Repeat BMP every 4 hours x 3, avoid rapid correction, no more than 8 mEq increase in 24 hours.  Pulmonary edema, suspect from volume overload IV fluid DC'd on 04/13/2022 Not hypoxic Mobilize as tolerated Incentive spirometer Closely monitor volume status and diurese as needed  Leukocytosis UA negative for pyuria Chest x-ray bilateral pulmonary edema. Repeat CBC  Ambulatory dysfunction Assessment OT Recommended SNF placement TOC consult  Coronary artery disease Chronic diastolic CHF Ischemic cardiomyopathy Resume home cardiac medications Denies any anginal symptoms.  Paroxysmal A-fib Resume home Eliquis On amiodarone and Coreg for rate control  Hyperlipidemia Resume home regimen  Hypertension Resume home regimen Monitor vital signs  Chronic anxiety/depression Resume home regimen   Code Status: Full code  Family Communication: None at bedside  Disposition Plan: Admitted to telemetry cardiac unit   Consultants: None.  Procedures: None.  Antimicrobials: None.  DVT prophylaxis: Eliquis.  Status is: Observation     Objective: Vitals:   04/14/22 2059 04/14/22 2339 04/15/22 9562 04/15/22 1308  BP: (!) 155/77 (!) 166/78 (!) 154/79 133/70  Pulse: (!) 58 64 64 (!) 58  Resp: 16 17 16 17   Temp: 97.6 F (36.4 C) 98 F (36.7 C) 97.7 F (36.5 C) 98.4 F (36.9 C)   TempSrc: Oral Oral Oral Oral  SpO2: 95% 94% 95% 95%  Weight:      Height:        Intake/Output Summary (Last 24 hours) at 04/15/2022 1140 Last data filed at 04/15/2022 7341 Gross per 24 hour  Intake 240 ml  Output 2425 ml  Net -2185 ml   Filed Weights   04/12/22 2112 04/14/22 0500  Weight: 91.6 kg 92.2 kg    Exam:  General: 77 y.o. year-old male well developed well nourished in no acute distress.  Alert and oriented x3. Cardiovascular: Regular rate and rhythm with no rubs or gallops.  No thyromegaly or JVD noted.   Respiratory: Mild rales at bases with no wheezing noted.  Poor inspiratory effort. Abdomen: Soft nontender nondistended with normal bowel sounds x4 quadrants. Musculoskeletal: No lower extremity edema. 2/4 pulses in all 4 extremities. Skin: No ulcerative lesions noted or rashes, Psychiatry: Mood is appropriate for condition and setting   Data Reviewed: CBC: Recent Labs  Lab 04/12/22 2115 04/13/22 0312  WBC 11.6* 13.8*  NEUTROABS 9.3*  --   HGB 14.3 13.3  HCT 43.3 39.4  MCV 92.1 90.0  PLT 320 937   Basic Metabolic Panel: Recent Labs  Lab 04/12/22 2115 04/12/22 2137 04/13/22 0553 04/13/22 1218 04/13/22 2151 04/14/22 0154 04/15/22 0634  NA 128*  --  128* 125* 124* 127* 137  K 3.6  --  3.4*  --  3.2* 3.2* 3.5  CL 95*  --  97*  --  93* 93* 106  CO2 24  --  22  --  22 27 25   GLUCOSE 123*  --  114*  --  120* 105* 138*  BUN 11  --  10  --  8 7* 11  CREATININE 0.59*  --  0.59*  --  0.54* 0.63 0.75  CALCIUM 9.1  --  8.6*  --  8.1* 8.5* 8.3*  MG  --  1.5*  --   --   --   --  2.1   GFR: Estimated Creatinine Clearance: 83.5 mL/min (by C-G formula based on SCr of 0.75 mg/dL). Liver Function Tests: Recent Labs  Lab 04/12/22 2115 04/13/22 0553  AST 30 30  ALT 31 30  ALKPHOS 55 50  BILITOT 1.0 1.1  PROT 7.3 6.6  ALBUMIN 4.3 3.9   Recent Labs  Lab 04/12/22 2327  LIPASE 30   No results for input(s): "AMMONIA" in the last 168 hours. Coagulation  Profile: No results for input(s): "INR", "PROTIME" in the last 168 hours. Cardiac Enzymes: Recent Labs  Lab 04/12/22 2137  CKTOTAL 118   BNP (last 3 results) No results for input(s): "PROBNP" in the last 8760 hours. HbA1C: Recent Labs    04/13/22 0312  HGBA1C 5.5   CBG: Recent Labs  Lab 04/14/22 0817 04/14/22 1215 04/14/22 1648 04/14/22 2101 04/15/22 0736  GLUCAP 103* 165* 93 131* 112*   Lipid Profile: No results for input(s): "CHOL", "HDL", "LDLCALC", "TRIG", "CHOLHDL", "LDLDIRECT" in the last 72 hours. Thyroid Function Tests: No results for input(s): "TSH", "T4TOTAL", "FREET4", "T3FREE", "THYROIDAB" in the last 72 hours. Anemia Panel: No results for input(s): "VITAMINB12", "FOLATE", "FERRITIN", "TIBC", "IRON", "RETICCTPCT" in the last 72 hours. Urine analysis:    Component Value Date/Time  COLORURINE YELLOW (A) 04/12/2022 2115   APPEARANCEUR CLEAR (A) 04/12/2022 2115   LABSPEC 1.018 04/12/2022 2115   PHURINE 6.0 04/12/2022 2115   GLUCOSEU NEGATIVE 04/12/2022 2115   HGBUR NEGATIVE 04/12/2022 2115   BILIRUBINUR NEGATIVE 04/12/2022 2115   KETONESUR NEGATIVE 04/12/2022 2115   PROTEINUR 100 (A) 04/12/2022 2115   NITRITE NEGATIVE 04/12/2022 2115   LEUKOCYTESUR NEGATIVE 04/12/2022 2115   Sepsis Labs: @LABRCNTIP (procalcitonin:4,lacticidven:4)  ) Recent Results (from the past 240 hour(s))  Resp panel by RT-PCR (RSV, Flu A&B, Covid) Anterior Nasal Swab     Status: None   Collection Time: 04/13/22 12:25 AM   Specimen: Anterior Nasal Swab  Result Value Ref Range Status   SARS Coronavirus 2 by RT PCR NEGATIVE NEGATIVE Final    Comment: (NOTE) SARS-CoV-2 target nucleic acids are NOT DETECTED.  The SARS-CoV-2 RNA is generally detectable in upper respiratory specimens during the acute phase of infection. The lowest concentration of SARS-CoV-2 viral copies this assay can detect is 138 copies/mL. A negative result does not preclude SARS-Cov-2 infection and should not  be used as the sole basis for treatment or other patient management decisions. A negative result may occur with  improper specimen collection/handling, submission of specimen other than nasopharyngeal swab, presence of viral mutation(s) within the areas targeted by this assay, and inadequate number of viral copies(<138 copies/mL). A negative result must be combined with clinical observations, patient history, and epidemiological information. The expected result is Negative.  Fact Sheet for Patients:  EntrepreneurPulse.com.au  Fact Sheet for Healthcare Providers:  IncredibleEmployment.be  This test is no t yet approved or cleared by the [place] FDA and  has been authorized for detection and/or diagnosis of SARS-CoV-2 by FDA under an Emergency Use Authorization (EUA). This EUA will remain  in effect (meaning this test can be used) for the duration of the COVID-19 declaration under Section 564(b)(1) of the Act, 21 U.S.C.section 360bbb-3(b)(1), unless the authorization is terminated  or revoked sooner.       Influenza A by PCR NEGATIVE NEGATIVE Final   Influenza B by PCR NEGATIVE NEGATIVE Final    Comment: (NOTE) The Xpert Xpress SARS-CoV-2/FLU/RSV plus assay is intended as an aid in the diagnosis of influenza from Nasopharyngeal swab specimens and should not be used as a sole basis for treatment. Nasal washings and aspirates are unacceptable for Xpert Xpress SARS-CoV-2/FLU/RSV testing.  Fact Sheet for Patients: EntrepreneurPulse.com.au  Fact Sheet for Healthcare Providers: IncredibleEmployment.be  This test is not yet approved or cleared by the [place] FDA and has been authorized for detection and/or diagnosis of SARS-CoV-2 by FDA under an Emergency Use Authorization (EUA). This EUA will remain in effect (meaning this test can be used) for the duration of the COVID-19 declaration under Section  564(b)(1) of the Act, 21 U.S.C. section 360bbb-3(b)(1), unless the authorization is terminated or revoked.     Resp Syncytial Virus by PCR NEGATIVE NEGATIVE Final    Comment: (NOTE) Fact Sheet for Patients: EntrepreneurPulse.com.au  Fact Sheet for Healthcare Providers: IncredibleEmployment.be  This test is not yet approved or cleared by the [place] FDA and has been authorized for detection and/or diagnosis of SARS-CoV-2 by FDA under an Emergency Use Authorization (EUA). This EUA will remain in effect (meaning this test can be used) for the duration of the COVID-19 declaration under Section 564(b)(1) of the Act, 21 U.S.C. section 360bbb-3(b)(1), unless the authorization is terminated or revoked.  Performed at [facility], [address] [place], [place] [address]  Studies: No results found.  Scheduled Meds:  amiodarone  200 mg Oral Daily   apixaban  5 mg Oral BID   atorvastatin  80 mg Oral Daily   buPROPion  300 mg Oral Daily   carvedilol  25 mg Oral BID WC   ezetimibe  10 mg Oral Daily   [START ON 04/16/2022] fluticasone  1 spray Each Nare Daily   gabapentin  200 mg Oral BID   insulin aspart  0-5 Units Subcutaneous QHS   insulin aspart  0-9 Units Subcutaneous TID WC   pantoprazole (PROTONIX) IV  40 mg Intravenous Q12H   sacubitril-valsartan  1 tablet Oral BID   sertraline  25 mg Oral Daily   sodium chloride flush  3 mL Intravenous Q12H    Continuous Infusions:   LOS: 1 day     Oran Rein, MD [facility] Pager [phone]  If 7PM-7AM, please contact night-coverage [email or web address] Password [email or web address] 04/15/2022, 11:40 AM

## 2022-04-16 DIAGNOSIS — I10 Essential (primary) hypertension: Secondary | ICD-10-CM

## 2022-04-16 DIAGNOSIS — R4182 Altered mental status, unspecified: Secondary | ICD-10-CM

## 2022-04-16 DIAGNOSIS — E871 Hypo-osmolality and hyponatremia: Secondary | ICD-10-CM | POA: Diagnosis not present

## 2022-04-16 DIAGNOSIS — R2681 Unsteadiness on feet: Secondary | ICD-10-CM

## 2022-04-16 DIAGNOSIS — R410 Disorientation, unspecified: Secondary | ICD-10-CM | POA: Diagnosis not present

## 2022-04-16 LAB — BASIC METABOLIC PANEL
Anion gap: 10 (ref 5–15)
BUN: 12 mg/dL (ref 8–23)
CO2: 23 mmol/L (ref 22–32)
Calcium: 8.6 mg/dL — ABNORMAL LOW (ref 8.9–10.3)
Chloride: 105 mmol/L (ref 98–111)
Creatinine, Ser: 0.74 mg/dL (ref 0.61–1.24)
GFR, Estimated: >60 mL/min (ref >60)
Glucose, Bld: 155 mg/dL — ABNORMAL HIGH (ref 70–99)
Potassium: 3.5 mmol/L (ref 3.5–5.1)
Sodium: 138 mmol/L (ref 135–145)

## 2022-04-16 LAB — GLUCOSE, CAPILLARY: Glucose-Capillary: 130 mg/dL — ABNORMAL HIGH (ref 70–99)

## 2022-04-16 NOTE — Discharge Summary (Signed)
Physician Discharge Summary  Patient: Devin [name] [facility]. WUJ:[id number] DOB: 09-Dec-1945   Code Status: Full Code Admit date: 04/12/2022 Discharge date: 04/16/2022 Disposition: Home health, PT, OT, and nurse aid PCP: Dione Housekeeper, MD  Recommendations for Outpatient Follow-up:  Follow up with PCP within 1-2 weeks Regarding general hospital follow up and preventative care Recommend monitoring metabolic panel. Consider discontinuing zoloft if has recurrent hyponatremia.   Discharge Diagnoses:  Principal Problem:   Acute delirium Active Problems:   CAD (coronary artery disease)   HTN (hypertension)   Gastroenteritis   Electrolyte abnormality   Nausea & vomiting   Unsteady gait  Brief Hospital Course Summary: [name] is a 77 y.o. male with past medical history of hypertension, diabetes, CAD status post CABG, CHF, atrial fibrillation on Eliquis, and AAA who presents to the ED complaining of altered mental status.  Patient reports that he recently started taking doxepin for insomnia and after his first dose yesterday, began to feel quite weak with hallucinations.  He reports seeing things that are not there with some foggy headedness and confusion.  He has felt very unsteady on his feet when walking with shaking in both of his legs when he attempts to do so.  He reports a tremor at baseline but this has been unchanged with the new medication.  He denies any headache, fever, cough, chest pain, shortness of breath, nausea, vomiting, diarrhea, or dysuria.  He has never had similar symptoms in the past, denies other significant medication changes.    04/13/22:  Seen and examined in the ED.  Alert and oriented x3.  Asked to eat.  Later when evaluated by OT, was having difficulty ambulating.  MRI is pending.  He needs his remote control for his neurospinal stimulator prior to MRI.   04/14/2022 : Seen and examined at bedside.  Patient feeling better AOx3.  No overnight events.    Patient persistently hyponatremic 127 improved from yesterday target correction  of 8 mEq per 24 hours.  And hypokalemic with potassium of 3.2 repleted follow-up with BMP in the morning.  Evaluated by OT recommendation is SNF.   04/15/2022 :  Back to his baseline. AOx3.  No overnight events.  Hyponatremia resolved.  Evaluated by PT recommendation is short-term rehab.  Dispo pending rehab placement now  2/5: continues to improve and declines SNF. He is stable to discharge home with home health per his request. Doxapin was discontinued for possible contribution to presenting symptoms.  All other chronic conditions were treated with home medications.   Discharge Condition: Good, improved Recommended discharge diet: Regular healthy diet  Consultations: None   Procedures/Studies: none  Discharge Instructions     Discharge patient   Complete by: As directed    Discharge disposition: 06-Home-Health Care Svc   Discharge patient date: 04/16/2022      Allergies as of 04/16/2022       Reactions   Ace Inhibitors Swelling   Morphine Swelling   Yellow Jacket Venom [bee Venom] Anaphylaxis   Trazodone Other (See Comments)        Medication List     STOP taking these medications    benzonatate 200 MG capsule Commonly known as: TESSALON   Doxepin HCl 3 MG Tabs   methocarbamol 500 MG tablet Commonly known as: Robaxin   sildenafil 50 MG tablet Commonly known as: VIAGRA       TAKE these medications    acetaminophen 500 MG tablet Commonly known as: TYLENOL  Take 1,000 mg by mouth every 6 (six) hours as needed for moderate pain or headache.   amiodarone 200 MG tablet Commonly known as: PACERONE Take 200 mg by mouth daily.   apixaban 5 MG Tabs tablet Commonly known as: ELIQUIS Take 5 mg by mouth 2 (two) times daily.   atorvastatin 80 MG tablet Commonly known as: LIPITOR Take 80 mg by mouth daily.   buPROPion 300 MG 24 hr tablet Commonly known as: WELLBUTRIN XL Take 300 mg  by mouth daily.   carvedilol 25 MG tablet Commonly known as: COREG Take 25 mg by mouth 2 (two) times daily with a meal.   CERAVE EX Apply 1 application topically daily as needed (dry skin).   Entresto 97-103 MG Generic drug: sacubitril-valsartan Take 1 tablet by mouth 2 (two) times daily.   EPINEPHrine 0.3 mg/0.3 mL Soaj injection Commonly known as: EPI-PEN Inject 0.3 mg into the muscle as needed for anaphylaxis.   ezetimibe 10 MG tablet Commonly known as: ZETIA Take 10 mg by mouth daily.   fluticasone 50 MCG/ACT nasal spray Commonly known as: FLONASE SPRAY 2 SPRAYS INTO EACH NOSTRIL EVERY DAY   FreeStyle Libre 14 Day Reader Hardie Pulley Use 1 Device as directed   gabapentin 300 MG capsule Commonly known as: NEURONTIN Take 300 mg by mouth 2 (two) times daily.   loratadine 10 MG tablet Commonly known as: CLARITIN Take 10 mg by mouth daily.   metFORMIN 500 MG tablet Commonly known as: GLUCOPHAGE Take 1,000 mg by mouth 2 (two) times daily.   nitroGLYCERIN 0.4 MG SL tablet Commonly known as: NITROSTAT Place 0.4 mg under the tongue every 5 (five) minutes as needed for chest pain.   OneTouch Delica Plus Lancet33G Misc Apply topically daily.   Precision QID Test test strip Generic drug: glucose blood Check fasting sugar once daily E11.9   OneTouch Ultra test strip Generic drug: glucose blood CHECK FASTING SUGAR ONCE DAILY E11.9   sertraline 25 MG tablet Commonly known as: ZOLOFT Take 25 mg by mouth daily.       Subjective   Pt reports feeling well. He has no complaints. States that he can go home since he has good support at home and all the equipment he needs to get around and care for himself. He denies any decrease in his functionality.   All questions and concerns were addressed at time of discharge.  Objective  Blood pressure (!) 179/90, pulse 64, temperature 98 F (36.7 C), resp. rate 18, height 6' (1.829 m), weight 92.9 kg, SpO2 97 %.   General: Pt is  alert, awake, not in acute distress, A&O x3 Cardiovascular: RRR, S1/S2 +, no rubs, no gallops Respiratory: CTA bilaterally, no wheezing, no rhonchi Abdominal: Soft, NT, ND, bowel sounds + Extremities: no edema, no cyanosis  The results of significant diagnostics from this hospitalization (including imaging, microbiology, ancillary and laboratory) are listed below for reference.   Imaging studies: DG Chest Port 1 View  Result Date: 04/13/2022 CLINICAL DATA:  Leukocytosis. EXAM: PORTABLE CHEST 1 VIEW COMPARISON:  April 07, 2020. FINDINGS: Mild cardiomegaly with mild central pulmonary vascular congestion is noted. Mild bibasilar pulmonary edema is noted. Sternotomy wires are noted. Bony thorax is unremarkable. IMPRESSION: Mild cardiomegaly with mild central pulmonary vascular congestion and bilateral pulmonary edema. Electronically Signed   By: Lupita Raider M.D.   On: 04/13/2022 08:10   CT HEAD WO CONTRAST ( )  Result Date: 04/12/2022 CLINICAL DATA:  Mental status change EXAM: CT HEAD WITHOUT  CONTRAST TECHNIQUE: Contiguous axial images were obtained from the base of the skull through the vertex without intravenous contrast. RADIATION DOSE REDUCTION: This exam was performed according to the departmental dose-optimization program which includes automated exposure control, adjustment of the mA and/or kV according to patient size and/or use of iterative reconstruction technique. COMPARISON:  CT head 04/07/2020.  MRI head 08/11/2021. FINDINGS: Brain: No evidence of acute infarction, hemorrhage, hydrocephalus, extra-axial collection or mass lesion/mass effect. Again seen is mild diffuse atrophy and mild periventricular white matter hypodensity, likely chronic small vessel ischemic change. Vascular: Atherosclerotic calcifications are present within the cavernous internal carotid arteries. Skull: Normal. Negative for fracture or focal lesion. Sinuses/Orbits: No acute finding. Other: None. IMPRESSION: 1. No  acute intracranial abnormality. 2. Mild diffuse atrophy and mild chronic small vessel ischemic change. Electronically Signed   By: Darliss Cheney M.D.   On: 04/12/2022 21:31    Labs: Basic Metabolic Panel: Recent Labs  Lab 04/12/22 2137 04/13/22 0553 04/13/22 1218 04/13/22 2151 04/14/22 0154 04/15/22 0634 04/16/22 0929  NA  --  128* 125* 124* 127* 137 138  K  --  3.4*  --  3.2* 3.2* 3.5 3.5  CL  --  97*  --  93* 93* 106 105  CO2  --  22  --  22 27 25 23   GLUCOSE  --  114*  --  120* 105* 138* 155*  BUN  --  10  --  8 7* 11 12  CREATININE  --  0.59*  --  0.54* 0.63 0.75 0.74  CALCIUM  --  8.6*  --  8.1* 8.5* 8.3* 8.6*  MG 1.5*  --   --   --   --  2.1  --    CBC: Recent Labs  Lab 04/12/22 2115 04/13/22 0312  WBC 11.6* 13.8*  NEUTROABS 9.3*  --   HGB 14.3 13.3  HCT 43.3 39.4  MCV 92.1 90.0  PLT 320 296   Microbiology: Results for orders placed or performed during the hospital encounter of 04/12/22  Resp panel by RT-PCR (RSV, Flu A&B, Covid) Anterior Nasal Swab     Status: None   Collection Time: 04/13/22 12:25 AM   Specimen: Anterior Nasal Swab  Result Value Ref Range Status   SARS Coronavirus 2 by RT PCR NEGATIVE NEGATIVE Final    Comment: (NOTE) SARS-CoV-2 target nucleic acids are NOT DETECTED.  The SARS-CoV-2 RNA is generally detectable in upper respiratory specimens during the acute phase of infection. The lowest concentration of SARS-CoV-2 viral copies this assay can detect is 138 copies/mL. A negative result does not preclude SARS-Cov-2 infection and should not be used as the sole basis for treatment or other patient management decisions. A negative result may occur with  improper specimen collection/handling, submission of specimen other than nasopharyngeal swab, presence of viral mutation(s) within the areas targeted by this assay, and inadequate number of viral copies(<138 copies/mL). A negative result must be combined with clinical observations, patient  history, and epidemiological information. The expected result is Negative.  Fact Sheet for Patients:  BloggerCourse.com  Fact Sheet for Healthcare Providers:  SeriousBroker.it  This test is no t yet approved or cleared by the [place] FDA and  has been authorized for detection and/or diagnosis of SARS-CoV-2 by FDA under an Emergency Use Authorization (EUA). This EUA will remain  in effect (meaning this test can be used) for the duration of the COVID-19 declaration under Section 564(b)(1) of the Act, 21 U.S.C.section 360bbb-3(b)(1), unless the  authorization is terminated  or revoked sooner.       Influenza A by PCR NEGATIVE NEGATIVE Final   Influenza B by PCR NEGATIVE NEGATIVE Final    Comment: (NOTE) The Xpert Xpress SARS-CoV-2/FLU/RSV plus assay is intended as an aid in the diagnosis of influenza from Nasopharyngeal swab specimens and should not be used as a sole basis for treatment. Nasal washings and aspirates are unacceptable for Xpert Xpress SARS-CoV-2/FLU/RSV testing.  Fact Sheet for Patients: BloggerCourse.com  Fact Sheet for Healthcare Providers: SeriousBroker.it  This test is not yet approved or cleared by the [place] FDA and has been authorized for detection and/or diagnosis of SARS-CoV-2 by FDA under an Emergency Use Authorization (EUA). This EUA will remain in effect (meaning this test can be used) for the duration of the COVID-19 declaration under Section 564(b)(1) of the Act, 21 U.S.C. section 360bbb-3(b)(1), unless the authorization is terminated or revoked.     Resp Syncytial Virus by PCR NEGATIVE NEGATIVE Final    Comment: (NOTE) Fact Sheet for Patients: BloggerCourse.com  Fact Sheet for Healthcare Providers: SeriousBroker.it  This test is not yet approved or cleared by the [place] FDA  and has been authorized for detection and/or diagnosis of SARS-CoV-2 by FDA under an Emergency Use Authorization (EUA). This EUA will remain in effect (meaning this test can be used) for the duration of the COVID-19 declaration under Section 564(b)(1) of the Act, 21 U.S.C. section 360bbb-3(b)(1), unless the authorization is terminated or revoked.  Performed at [facility], [address] [place], [place] [address]    Time coordinating discharge: Over 30 minutes  Leeroy Bock, MD  [facility] 04/16/2022, 11:08 AM

## 2022-04-16 NOTE — TOC Transition Note (Signed)
Transition of Care [facility]) - CM/SW Discharge Note   Patient Details  Name: Devin Taft [facility] MRN: [id number] Date of Birth: Jul 19, 1945  Transition of Care [facility]) CM/SW Contact:  Candie Chroman, LCSW Phone Number: 04/16/2022, 12:37 PM   Clinical Narrative:   Patient has orders to discharge home today. Per MD, he was declining SNF. Patient had already left before CSW could see him so called him. He is agreeable to home health. No agency preference. He last worked with [place] last year but they were unable to accept. Set up with [facility] Lemmings for PT, OT, RN, aide. DME recommendations for RW and 3-in-1 but patient said he already had them. He is requesting a wheelchair. Ordered through [employer] to be shipped to the home. No further concerns. CSW signing off.  Final next level of care: [place] Barriers to Discharge: No Barriers Identified   Patient Goals and CMS Choice      Discharge Placement                      Patient and family notified of of transfer: 04/16/22  Discharge Plan and Services Additional resources added to the After Visit Summary for                  DME Arranged: Wheelchair manual DME Agency: [employer] Date DME Agency Contacted: 04/16/22   Representative spoke with at DME Agency: Suanne Marker HH Arranged: RN, PT, OT, Nurse's Aide [place] Agency: [place] Date [facility] Agency Contacted: 04/16/22   Representative spoke with at [place]: [place] Determinants of Health ([place]) Interventions [place]: No Food Insecurity (04/13/2022)  Housing: Low Risk  (04/13/2022)  Transportation Needs: No Transportation Needs (04/13/2022)  Utilities: Not At Risk (04/13/2022)  Depression (PHQ2-9): Medium Risk (01/12/2020)  Tobacco Use: Medium Risk (04/12/2022)     Readmission Risk Interventions     No data to display

## 2022-04-16 NOTE — TOC CM/SW Note (Signed)
    Durable Medical Equipment  (From admission, onward)           Start     Ordered   04/16/22 1223  For home use only DME standard manual wheelchair with seat cushion  Once       Comments: Patient suffers from physical deconditioning which impairs their ability to perform daily activities like bathing, feeding, and toileting in the home.  A cane or walker will not resolve issue with performing activities of daily living. A wheelchair will allow patient to safely perform daily activities. Patient can safely propel the wheelchair in the home or has a caregiver who can provide assistance. Length of need Lifetime. Accessories: elevating leg rests (ELRs), wheel locks, extensions and anti-tippers.   04/16/22 1222

## 2022-04-23 ENCOUNTER — Encounter: Payer: Self-pay | Admitting: Cardiovascular Disease

## 2022-04-23 ENCOUNTER — Ambulatory Visit (INDEPENDENT_AMBULATORY_CARE_PROVIDER_SITE_OTHER): Payer: Medicare HMO | Admitting: Cardiovascular Disease

## 2022-04-23 VITALS — BP 140/82 | HR 62 | Ht 72.0 in | Wt 204.0 lb

## 2022-04-23 DIAGNOSIS — I25700 Atherosclerosis of coronary artery bypass graft(s), unspecified, with unstable angina pectoris: Secondary | ICD-10-CM | POA: Diagnosis not present

## 2022-04-23 DIAGNOSIS — I5020 Unspecified systolic (congestive) heart failure: Secondary | ICD-10-CM | POA: Diagnosis not present

## 2022-04-23 DIAGNOSIS — I714 Abdominal aortic aneurysm, without rupture, unspecified: Secondary | ICD-10-CM

## 2022-04-23 DIAGNOSIS — I10 Essential (primary) hypertension: Secondary | ICD-10-CM

## 2022-04-23 DIAGNOSIS — I4891 Unspecified atrial fibrillation: Secondary | ICD-10-CM

## 2022-04-23 DIAGNOSIS — I739 Peripheral vascular disease, unspecified: Secondary | ICD-10-CM

## 2022-04-23 DIAGNOSIS — I255 Ischemic cardiomyopathy: Secondary | ICD-10-CM

## 2022-04-23 NOTE — Assessment & Plan Note (Signed)
Patient is no longer having symptoms of CAD

## 2022-04-23 NOTE — Progress Notes (Signed)
Cardiology Office Note   Date:  04/23/2022   ID:  Devin Gurney Viaan Mayes., DOB 17-Mar-1945, MRN [id number]  PCP:  Valera Castle, MD  Cardiologist:  Neoma Laming, MD      History of Present Illness: Devin Johnstone. is a 77 y.o. male who presents for  Chief Complaint  Patient presents with   Follow-up    2 month follow up    Denies chest pain and SOB, and palpitation.   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   Hypertension    Ischemic cardiomyopathy    MI, old    Peripheral neuropathy    PFO (patent foramen ovale) 05/2019   s/p repair   Sleep apnea    Spinal stenosis of lumbar region    TIA (transient ischemic attack)      Past Surgical History:  Procedure Laterality Date   APPENDECTOMY     BREAST SURGERY Right    benign mass   CARDIOVERSION Right 12/15/2019   Procedure: CARDIOVERSION;  Surgeon: Dionisio David, MD;  Location: [facility] ORS;  Service: Cardiovascular;  Laterality: Right;   CATARACT EXTRACTION, BILATERAL     COLONOSCOPY     CORONARY ANGIOPLASTY WITH STENT PLACEMENT     CORONARY ARTERY BYPASS GRAFT  05/2019   LIMA-LAD   LEFT HEART CATH AND CORS/GRAFTS ANGIOGRAPHY N/A 05/21/2019   Procedure: LEFT HEART CATH AND CORONARY ANGIOGRAPHY;  Surgeon: Dionisio David, MD;  Location: [place] CV LAB;  Service: Cardiovascular;  Laterality: N/A;   LUMBAR LAMINECTOMY/DECOMPRESSION MICRODISCECTOMY N/A 07/04/2020   Procedure: L4-5 LAMINECTOMY;  Surgeon: Deetta Perla, MD;  Location: [facility] ORS;  Service: Neurosurgery;  Laterality: N/A;   REPLACEMENT TOTAL KNEE BILATERAL       Current  Outpatient Medications  Medication Sig Dispense Refill   albuterol (VENTOLIN HFA) 108 (90 Base) MCG/ACT inhaler Inhale 2 puffs into the lungs every 6 (six) hours as needed for wheezing or shortness of breath.     amiodarone (PACERONE) 200 MG tablet Take 200 mg by mouth daily.     apixaban (ELIQUIS) 5 MG TABS tablet Take 5 mg by mouth 2 (two) times daily.      atorvastatin (LIPITOR) 80 MG tablet Take 80 mg by mouth daily.     buPROPion (WELLBUTRIN XL) 300 MG 24 hr tablet Take 300 mg by mouth daily.     carvedilol (COREG) 25 MG tablet Take 25 mg by mouth 2 (two) times daily with a meal.     Continuous Blood Gluc Receiver (FREESTYLE LIBRE 14 DAY READER) DEVI Use 1 Device as directed     ENTRESTO 97-103 MG Take 1 tablet by mouth 2 (two) times daily.     EPINEPHrine 0.3 mg/0.3 mL IJ SOAJ injection Inject 0.3 mg into the muscle as needed for anaphylaxis.     ezetimibe (ZETIA) 10 MG tablet Take 10 mg by mouth daily.     fluticasone (FLONASE) 50 MCG/ACT nasal spray SPRAY 2 SPRAYS INTO EACH NOSTRIL EVERY DAY     gabapentin (NEURONTIN) 300 MG capsule  Take 300 mg by mouth 2 (two) times daily.     glucose blood (PRECISION QID TEST) test strip Check fasting sugar once daily E11.9     Lancets (ONETOUCH DELICA PLUS 123XX123) MISC Apply topically daily.     loratadine (CLARITIN) 10 MG tablet Take 10 mg by mouth daily.     metFORMIN (GLUCOPHAGE) 500 MG tablet Take 1,000 mg by mouth 2 (two) times daily.     nitroGLYCERIN (NITROSTAT) 0.4 MG SL tablet Place 0.4 mg under the tongue every 5 (five) minutes as needed for chest pain.     ONETOUCH ULTRA test strip CHECK FASTING SUGAR ONCE DAILY E11.9     sertraline (ZOLOFT) 25 MG tablet Take 25 mg by mouth daily.     traZODone (DESYREL) 50 MG tablet Take 50 mg by mouth at bedtime.     No current facility-administered medications for this visit.   Facility-Administered Medications Ordered in Other Visits  Medication Dose Route Frequency Provider Last Rate Last Admin    sodium chloride flush (NS) 0.9 % injection 3 mL  3 mL Intravenous Q12H Neoma Laming A, MD        Allergies:   Morphine, Yellow jacket venom [bee venom], and Trazodone    Social History:   reports that he has quit smoking. He has never used smokeless tobacco. He reports that he does not currently use alcohol. He reports that he does not use drugs.   Family History:  family history includes Osteoarthritis in his mother; Other (age of onset: 34) in his father.  ROS:     Review of Systems  Constitutional: Negative.   HENT: Negative.    Eyes: Negative.   Respiratory: Negative.    Gastrointestinal: Negative.   Genitourinary: Negative.   Musculoskeletal: Negative.   Skin: Negative.   Neurological: Negative.   Endo/Heme/Allergies: Negative.   Psychiatric/Behavioral: Negative.    All other systems reviewed and are negative.  All other systems are reviewed and negative.  PHYSICAL EXAM: VS:  BP (!) 140/82   Pulse 62   Ht 6' (1.829 m)   Wt 204 lb (92.5 kg)   SpO2 99%   BMI 27.67 kg/m  , BMI Body mass index is 27.67 kg/m. Last weight:  Wt Readings from Last 3 Encounters:  04/23/22 204 lb (92.5 kg)  04/16/22 204 lb 11.2 oz (92.9 kg)  03/15/22 206 lb (93.4 kg)   Physical Exam Vitals reviewed.  Constitutional:      Appearance: Normal appearance. He is normal weight.  HENT:     Head: Normocephalic.     Nose: Nose normal.     Mouth/Throat:     Mouth: Mucous membranes are moist.  Eyes:     Pupils: Pupils are equal, round, and reactive to light.  Cardiovascular:     Rate and Rhythm: Normal rate and regular rhythm.     Pulses: Normal pulses.     Heart sounds: Normal heart sounds.  Pulmonary:     Effort: Pulmonary effort is normal.  Abdominal:     General: Abdomen is flat. Bowel sounds are normal.  Musculoskeletal:        General: Normal range of motion.     Cervical back: Normal range of motion.  Skin:    General: Skin is warm.  Neurological:     General: No focal  deficit present.     Mental Status: He is alert.  Psychiatric:        Mood and Affect: Mood normal.  EKG:   Recent Labs: 04/13/2022: ALT 30; Hemoglobin 13.3; Platelets 296 04/15/2022: Magnesium 2.1 04/16/2022: BUN 12; Creatinine, Ser 0.74; Potassium 3.5; Sodium 138    Lipid Panel No results found for: "CHOL", "TRIG", "HDL", "CHOLHDL", "VLDL", "LDLCALC", "LDLDIRECT"    Other studies Reviewed: Additional studies/ records that were reviewed today include:  Review of the above records demonstrates:   REASON FOR VISIT  Referred by Dr.Kamesha Herne Humphrey Rolls.        TESTS  Imaging: Computed Tomographic Angiography:  Cardiac multidetector CT was performed paying particular attention to the coronary arteries for the diagnosis of: CAD. Diagnostic Drugs:  Administered iohexol (Omnipaque) through an antecubital vein and images from the examination were analyzed for the presence and extent of coronary artery disease, using 3D image processing software. 100 mL of non-ionic contrast (Omnipaque) was used.        ASSESSMENT   The left main artery was abnormal:2  The proximal LAD artery are abnormal:4  The mid-LAD artery was abnormal:2  The distal LAD artery was abnormal:2     TEST CONCLUSIONS  Quality of study: Good  1-Calcium score: 2989.5  3-Right dominant system.  3-RCA has no significant disease. LIMA to LAD is patent. Stent in Mid LCX has no significant disease.   Neoma Laming MD  Electronically signed by: Neoma Laming     Date: 11/10/2020 10:54     No data to display          REASON FOR VISIT  Visit for: Echocardiogram/I48.91  Sex:   Male         wt= 209   lbs.  BP=132/70  Height=71    inches.        TESTS  Imaging: Echocardiogram:  An echocardiogram in (2-d) mode was performed and in Doppler mode with color flow velocity mapping was performed. The aortic valve cusps are abnormal 2.0  cm, flow velocity 1.39   m/s, and systolic calculated mean flow gradient 4    mmHg. Mitral valve diastolic peak flow velocity E .775    m/s and E/A ratio 1.4. Aortic root diameter 3.6   cm. The LVOT internal diameter 2.6  cm and flow velocity was abnormal .752  m/s. LV systolic dimension 1.9  cm, diastolic 123XX123   cm, posterior wall thickness 1.37    cm, fractional shortening 28 %, and EF 56.3  %. IVS thickness 1.46  cm. LA dimension 5.6 cm. Mitral Valve has Trace Regurgitation. Tricuspid Valve has Trace Regurgitation.     ASSESSMENT  Technically adequate study.  Normal left ventricular systolic function..  Normal right ventricular systolic function.  Normal right ventricular diastolic function.  Normal left ventricular wall motion.  Normal right ventricular wall motion.  Trace tricuspid regurgitation.  Mild pulmonary hypertension.  Trace mitral regurgitation.  Fibrocalcified aortic valve .  No pericardial effusion.  Moderately dilated Left atrium   Mild LVH.     THERAPY   Referring physician: Dionisio David  Sonographer: Neoma Laming.   Neoma Laming MD  Electronically signed by: Neoma Laming     Date: 01/16/2022 11:50   ASSESSMENT AND PLAN:    ICD-10-CM   1. Coronary artery disease involving coronary bypass graft with unstable angina pectoris, unspecified whether native or transplanted heart (HCC)  I25.700     2. Primary hypertension  I10     3. Systolic congestive heart failure, unspecified HF chronicity (HCC)  I50.20     4. Abdominal aortic aneurysm (AAA), unspecified part, unspecified whether ruptured ([place])  I71.40     5. Atrial fibrillation status post cardioversion (HCC)  I48.91     6. Ischemic cardiomyopathy  I25.5     7. PAD (peripheral artery disease) (HCC)  I73.9        Problem List Items Addressed This Visit       Cardiovascular and Mediastinum   CAD (coronary artery disease) - Primary    Patient is no longer having symptoms of CAD      HTN (hypertension)    Bp is 108/68 on repeat evaluation      Congestive heart  failure (CHF) ([place])   AAA (abdominal aortic aneurysm) (HCC)   Atrial fibrillation status post cardioversion [facility])   Ischemic cardiomyopathy   PAD (peripheral artery disease) ([place])       Disposition:   Return in about 3 months (around 07/22/2022).     Total time spent: 25 minutes   Signed,  Neoma Laming, MD  04/23/2022 1:25 PM    [facility]

## 2022-04-23 NOTE — Assessment & Plan Note (Signed)
Bp is 108/68 on repeat evaluation

## 2022-05-09 ENCOUNTER — Other Ambulatory Visit: Payer: Self-pay | Admitting: Cardiovascular Disease

## 2022-05-15 ENCOUNTER — Emergency Department: Payer: Medicare HMO

## 2022-05-15 ENCOUNTER — Observation Stay
Admission: EM | Admit: 2022-05-15 | Discharge: 2022-05-15 | Disposition: A | Discharge status: Home / Self Care | Payer: Medicare HMO | Attending: Internal Medicine | Admitting: Internal Medicine

## 2022-05-15 ENCOUNTER — Other Ambulatory Visit: Payer: Self-pay

## 2022-05-15 DIAGNOSIS — R001 Bradycardia, unspecified: Secondary | ICD-10-CM | POA: Insufficient documentation

## 2022-05-15 DIAGNOSIS — I110 Hypertensive heart disease with heart failure: Secondary | ICD-10-CM | POA: Insufficient documentation

## 2022-05-15 DIAGNOSIS — Z1152 Encounter for screening for COVID-19: Secondary | ICD-10-CM | POA: Diagnosis not present

## 2022-05-15 DIAGNOSIS — Z7901 Long term (current) use of anticoagulants: Secondary | ICD-10-CM | POA: Diagnosis not present

## 2022-05-15 DIAGNOSIS — R531 Weakness: Principal | ICD-10-CM | POA: Insufficient documentation

## 2022-05-15 DIAGNOSIS — Z951 Presence of aortocoronary bypass graft: Secondary | ICD-10-CM | POA: Diagnosis not present

## 2022-05-15 DIAGNOSIS — I2510 Atherosclerotic heart disease of native coronary artery without angina pectoris: Secondary | ICD-10-CM | POA: Diagnosis not present

## 2022-05-15 DIAGNOSIS — R296 Repeated falls: Secondary | ICD-10-CM | POA: Diagnosis not present

## 2022-05-15 DIAGNOSIS — Z79899 Other long term (current) drug therapy: Secondary | ICD-10-CM | POA: Diagnosis not present

## 2022-05-15 DIAGNOSIS — R9431 Abnormal electrocardiogram [ECG] [EKG]: Secondary | ICD-10-CM | POA: Insufficient documentation

## 2022-05-15 DIAGNOSIS — I509 Heart failure, unspecified: Secondary | ICD-10-CM | POA: Insufficient documentation

## 2022-05-15 DIAGNOSIS — W19XXXA Unspecified fall, initial encounter: Secondary | ICD-10-CM

## 2022-05-15 DIAGNOSIS — I10 Essential (primary) hypertension: Secondary | ICD-10-CM | POA: Diagnosis not present

## 2022-05-15 DIAGNOSIS — I440 Atrioventricular block, first degree: Secondary | ICD-10-CM | POA: Diagnosis not present

## 2022-05-15 LAB — RESP PANEL BY RT-PCR (RSV, FLU A&B, COVID)  RVPGX2
Influenza A by PCR: NEGATIVE
Influenza B by PCR: NEGATIVE
Resp Syncytial Virus by PCR: NEGATIVE
SARS Coronavirus 2 by RT PCR: NEGATIVE

## 2022-05-15 LAB — CBC WITH DIFFERENTIAL/PLATELET
Abs Immature Granulocytes: 0.06 10*3/uL (ref 0.00–0.07)
Basophils Absolute: 0.1 10*3/uL (ref 0.0–0.1)
Basophils Relative: 1 %
Eosinophils Absolute: 0.1 10*3/uL (ref 0.0–0.5)
Eosinophils Relative: 1 %
HCT: 43.8 % (ref 39.0–52.0)
Hemoglobin: 14.5 g/dL (ref 13.0–17.0)
Immature Granulocytes: 1 %
Lymphocytes Relative: 15 %
Lymphs Abs: 1.4 10*3/uL (ref 0.7–4.0)
MCH: 30 pg (ref 26.0–34.0)
MCHC: 33.1 g/dL (ref 30.0–36.0)
MCV: 90.5 fL (ref 80.0–100.0)
Monocytes Absolute: 0.7 10*3/uL (ref 0.1–1.0)
Monocytes Relative: 7 %
Neutro Abs: 7.3 10*3/uL (ref 1.7–7.7)
Neutrophils Relative %: 75 %
Platelets: 286 10*3/uL (ref 150–400)
RBC: 4.84 MIL/uL (ref 4.22–5.81)
RDW: 13.7 % (ref 11.5–15.5)
WBC: 9.5 10*3/uL (ref 4.0–10.5)
nRBC: 0 % (ref 0.0–0.2)

## 2022-05-15 LAB — BASIC METABOLIC PANEL
Anion gap: 11 (ref 5–15)
BUN: 12 mg/dL (ref 8–23)
CO2: 23 mmol/L (ref 22–32)
Calcium: 9 mg/dL (ref 8.9–10.3)
Chloride: 97 mmol/L — ABNORMAL LOW (ref 98–111)
Creatinine, Ser: 0.81 mg/dL (ref 0.61–1.24)
GFR, Estimated: >60 mL/min (ref >60)
Glucose, Bld: 96 mg/dL (ref 70–99)
Potassium: 4 mmol/L (ref 3.5–5.1)
Sodium: 131 mmol/L — ABNORMAL LOW (ref 135–145)

## 2022-05-15 LAB — URINALYSIS, W/ REFLEX TO CULTURE (INFECTION SUSPECTED)
Bacteria, UA: NONE SEEN
Bilirubin Urine: NEGATIVE
Glucose, UA: NEGATIVE mg/dL
Hgb urine dipstick: NEGATIVE
Ketones, ur: NEGATIVE mg/dL
Leukocytes,Ua: NEGATIVE
Nitrite: NEGATIVE
Protein, ur: NEGATIVE mg/dL
Specific Gravity, Urine: 1.009 (ref 1.005–1.030)
Squamous Epithelial / HPF: NONE SEEN /HPF (ref 0–5)
pH: 6 (ref 5.0–8.0)

## 2022-05-15 MED ORDER — SODIUM CHLORIDE 0.9 % IV BOLUS
500.0000 mL | Freq: Once | INTRAVENOUS | Status: AC
  Administered 2022-05-15: 500 mL via INTRAVENOUS

## 2022-05-15 MED ORDER — ATORVASTATIN CALCIUM 80 MG PO TABS
80.0000 mg | ORAL_TABLET | Freq: Every day | ORAL | Status: DC
  Filled 2022-05-15: qty 1

## 2022-05-15 MED ORDER — ACETAMINOPHEN 650 MG RE SUPP: 650.0000 mg | Freq: Four times a day (QID) | RECTAL | Status: DC | PRN

## 2022-05-15 MED ORDER — ACETAMINOPHEN 325 MG PO TABS: 650.0000 mg | ORAL_TABLET | Freq: Four times a day (QID) | ORAL | Status: DC | PRN

## 2022-05-15 MED ORDER — ONDANSETRON HCL 4 MG PO TABS: 4.0000 mg | ORAL_TABLET | Freq: Four times a day (QID) | ORAL | Status: DC | PRN

## 2022-05-15 MED ORDER — SENNOSIDES-DOCUSATE SODIUM 8.6-50 MG PO TABS: 1.0000 | ORAL_TABLET | Freq: Every evening | ORAL | Status: DC | PRN

## 2022-05-15 MED ORDER — ONDANSETRON HCL 4 MG/2ML IJ SOLN: 4.0000 mg | Freq: Four times a day (QID) | INTRAMUSCULAR | Status: DC | PRN

## 2022-05-15 NOTE — ED Provider Notes (Signed)
[facility] Provider Note    Event Date/Time   First MD Initiated Contact with Patient 05/15/22 1335     (approximate)   History   Fall   HPI  Devin [name]. is a 77 y.o. male with a past medical history of CAD s/p CABG on Eliquis, hypertension, hyponatremia who presents today for evaluation of fall x 2 at home.  Patient also reports that he has been forgetful recently.  He reports that this last happened to him recently and he was diagnosed with a low sodium.  He denies any chest pain or shortness of breath.  No abdominal pain.  He reports that he is unsteady on his feet.  He denies head strike or LOC.  He reports that he has pain in his right hip from his fall.  He denies headache.  He denies trouble speaking.  He denies recent infectious symptoms.  He denies dizziness.  Patient Active Problem List   Diagnosis Date Noted   Frequent falls 05/15/2022   Unsteady gait 04/16/2022   Acute delirium 04/13/2022   Electrolyte abnormality 04/13/2022   Nausea & vomiting 04/13/2022   PAD (peripheral artery disease) ([place]) 09/03/2021   Localized, primary osteoarthritis 11/24/2020   Wound healing, delayed 09/08/2020   History of lumbar surgery 09/08/2020   Chronic ischemic heart disease 07/13/2020   History of MI (myocardial infarction) 07/13/2020   History of PTCA 1 07/13/2020   Other personal history presenting hazards to health 07/13/2020   Pure hypercholesterolemia 07/13/2020   Elevated lactic acid level 04/19/2020   Generalized weakness 04/09/2020   Fall at home 04/09/2020   Gastroenteritis 04/09/2020   AMS (altered mental status) 04/08/2020   Hypoxia 02/01/2020   Atrial fibrillation status post cardioversion ([place]) 12/22/2019   Congestive heart failure (CHF) ([place]) 12/13/2019   Hyponatremia 12/06/2019   Acute postoperative anemia due to expected blood loss 05/28/2019   Hyperlipidemia 05/28/2019   Ischemic cardiomyopathy 05/28/2019   S/P CABG  (coronary artery bypass graft) 05/28/2019   Unstable angina ([place]) 05/24/2019   Chest pain 05/21/2019   Ischemic chest pain ([place]) 05/16/2019   CAD (coronary artery disease) 05/16/2019   HTN (hypertension) 05/16/2019   Depression 05/16/2019   GERD (gastroesophageal reflux disease) 05/16/2019   Verruca plantaris 01/23/2018   Obstructive sleep apnea syndrome 01/20/2018   Major depressive disorder, single episode, moderate ([place]) 07/05/2016   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      Excl. in [place]?     Most recent vital signs: Vitals:   05/15/22 1518 05/15/22 1805  BP: (!) 148/82 (!) 187/85  Pulse: 65 65  Resp:  16  Temp:  97.7 F (36.5 C)  SpO2: 96% 99%    Physical Exam Vitals and nursing note reviewed.  Constitutional:      General: Awake and alert. No acute distress.  Answering questions normally    Appearance: Normal appearance. The patient is normal weight.  HENT:     Head: Normocephalic and atraumatic.     Mouth: Mucous membranes are moist.  Eyes:     General: PERRL. Normal EOMs        Right eye: No discharge.        Left eye: No discharge.     Conjunctiva/sclera: Conjunctivae normal.  Cardiovascular:     Rate and Rhythm: Normal rate and regular rhythm.     Pulses: Normal pulses.  Pulmonary:     Effort: Pulmonary effort is normal. No respiratory distress.     Breath sounds: Normal breath sounds.  Abdominal:     Abdomen is soft. There is no  abdominal tenderness. No rebound or guarding. No distention. Musculoskeletal:        General: No swelling. Normal range of motion.     Cervical back: Normal range of motion and neck supple.  No midline cervical spine tenderness.  Full range of motion of neck.  Negative Spurling test.  Negative Lhermitte sign.  Normal strength and sensation in bilateral upper extremities. Normal grip strength bilaterally.  Normal intrinsic muscle function of the hand bilaterally.  Normal radial pulses bilaterally. Pelvis stable.  Able to actively lift both legs up off of the stretcher.  Negative logroll of hip bilaterally. Skin:    General: Skin is warm and dry.     Capillary Refill: Capillary refill takes less than 2 seconds.     Findings: No rash.  Neurological:     Mental Status: The patient is awake and alert. Aox3 Neurological: GCS 15 alert and oriented x3 Normal speech, no expressive or receptive aphasia or dysarthria Cranial nerves II through XII intact Normal visual fields 5 out of 5 strength in all 4 extremities with intact sensation throughout No extremity drift Normal finger-to-nose testing, no limb or truncal ataxia     ED Results / Procedures / Treatments   Labs (all labs ordered are listed, but only abnormal results are displayed) Labs Reviewed  URINALYSIS, W/ REFLEX TO CULTURE (INFECTION SUSPECTED) - Abnormal; Notable for the following components:      Result Value   Color, Urine YELLOW (*)    APPearance CLEAR (*)    All other components within normal limits  BASIC METABOLIC PANEL - Abnormal; Notable for the following components:   Sodium 131 (*)    Chloride 97 (*)    All other components within normal limits  RESP PANEL BY RT-PCR (RSV, FLU A&B, COVID)  RVPGX2  CBC WITH DIFFERENTIAL/PLATELET  MAGNESIUM  PHOSPHORUS  BASIC METABOLIC PANEL  CBC  TSH  VITAMIN B12  ETHANOL  URINE DRUG SCREEN, QUALITATIVE ([facility] ONLY)     EKG     RADIOLOGY I independently reviewed and  interpreted imaging and agree with radiologists findings.     PROCEDURES:  Critical Care performed:   Procedures   MEDICATIONS ORDERED IN ED: Medications  atorvastatin (LIPITOR) tablet 80 mg (has no administration in time range)  acetaminophen (TYLENOL) tablet 650 mg (has no administration in time range)    Or  acetaminophen (TYLENOL) suppository 650 mg (has no administration in time range)  ondansetron (ZOFRAN) tablet 4 mg (has no administration in time range)    Or  ondansetron (  ZOFRAN) injection 4 mg (has no administration in time range)  senna-docusate (Senokot-S) tablet 1 tablet (has no administration in time range)  sodium chloride 0.9 % bolus 500 mL (0 mLs Intravenous Stopped 05/15/22 1735)     IMPRESSION / MDM / ASSESSMENT AND PLAN / ED COURSE  I reviewed the triage vital signs and the nursing notes.   Differential diagnosis includes, but is not limited to, hyponatremia, CVA, infectious etiology, other electrolyte disarray.  Patient is awake and alert, hemodynamically stable and afebrile.  I reviewed the patient's chart, he had a recent admission from 04/12/2022 through 04/16/2022 for similar presentation.  At that time he had gait instability, however it was in the setting of taking doxepin for insomnia.  He was initially hyponatremic to 127, though patient's sodium was fixed slowly and he was evaluated by Occupational Therapy, and eventually discharged home.  Today, patient is awake and alert, hemodynamically stable and afebrile.  He was placed on the cardiac monitor.  He has no focal neurological deficits on neurological exam, however he is unable to ambulate without assistance.  Labs obtained are overall reassuring.  His sodium is near baseline at 131.  No urinary abnormalities.  CT head and neck obtained are stable from previous.  X-rays are also reassuring against any acute findings.  He has no cervical spine tenderness.  He has full strength and sensation in his bilateral  upper extremities, normal grip strength, not consistent with central cord syndrome.  No incontinence or altered mental status to suggest normal pressure hydrocephalus.  The RN attempted to ambulate the patient, however patient was unable to tolerate standing, reports that he would fall if the nurse let go of him.  I subsequently discussed with Dr. Kerman Passey who evaluated the patient, who recommends MRI of his brain, COVID swab, and admission to the hospitalist service.  Patient is unable to have MRI until he has his remote for his OSA stimulator.  Dr. Kerman Passey discussed with the hospitalist who has agreed to admit the patient.  After patient was accepted by Dr. Tobie Poet, patient reported that he had changed his mind and would like to go home.  Discussed that he is a significant fall risk and may be due to danger to himself given his fall risk.  He reports that he feels well enough to go home and does not wish to be admitted to the hospital.  He understands the benefits of staying in the hospital which would include further workup, consultation with specialists, and further management.  The risks of going home clued but are not limited to worsening disease, permanent disability, death, delayed diagnosis.  He understands risks of going home.  Long discussion with Dr. Kerman Passey.  Patient was discharged in stable condition.   Patient's presentation is most consistent with acute presentation with potential threat to life or bodily function.   Clinical Course as of 05/15/22 1823  Tue May 15, 2022  1357 X-ray tech messaged me to inform you that the patient is also complaining of low back pain and asked me to order a x-ray lumbar spine as well [JP]    Clinical Course User Index [JP] Alani Sabbagh, Clarnce Flock, PA-C     FINAL CLINICAL IMPRESSION(S) / ED DIAGNOSES   Final diagnoses:  Fall, initial encounter  Weakness     Rx / DC Orders   ED Discharge Orders     None        Note:  This document was  prepared using Dragon voice recognition  software and may include unintentional dictation errors.   Emeline Gins 05/15/22 Lorna Dibble, MD 05/15/22 [phone]

## 2022-05-15 NOTE — ED Provider Notes (Signed)
-----------------------------------------   4:16 PM on 05/15/2022 ----------------------------------------- I have seen and evaluated the patient.  He continues states significant weakness unable to ambulate.  We attempted to have the nurse help the patient ambulate he was unable to ambulate even 1 or 2 steps and would have fallen if not helped by the nurse.  Patient states he has fallen 3 times in the past 3 days now.  States this has happened twice previously in both with hyponatremia though today the patient's sodium level is near baseline at 131.  Remainder the chemistry shows no significant findings, CBC is normal with normal white blood cell count, urinalysis is normal.  Denies any fever cough or congestion.  No urinary symptoms.  COVID/flu/RSV is pending.  X-rays of the hip and pelvis are negative lumbar spine is negative, CT scan of the head and C-spine show no Significant acute abnormality.  Significant acute abnormality.  Patient unable to tolerate MRI due to OSA implant.   implant given the patient's inability to ambulate which appears to be an acute issue I believe the patient would likely benefit from admission to the hospital service for IV hydration which seems to have solved the issue both times previously.  I also believe he probably needs further workup such as neurology consultation.   Harvest Dark, MD 05/15/22 [phone]

## 2022-05-15 NOTE — ED Notes (Signed)
Patient ambulated to hallway bathroom with one assist.

## 2022-05-15 NOTE — ED Notes (Signed)
Patient is talking to MRI on the phone.

## 2022-05-15 NOTE — Discharge Instructions (Signed)
Please call the number provided for neurology to arrange a follow-up appointment as soon as possible.  Return to the emergency department for any further weakness, any falls, or any other symptom personally concerning to yourself.

## 2022-05-15 NOTE — ED Provider Notes (Signed)
-----------------------------------------   5:44 PM on 05/15/2022 ----------------------------------------- Patient states he has changes mind.  States he is feeling better and wants to go home.  I had a long discussion with the patient making sure he felt safe making sure he did not feel weak or feel like he was a danger to himself due to a fall risk.  Patient states he wants to go home he thinks he is well enough to go home and does not want to be admitted.  He understands the risks but still wishes to go home.  We will refer to outpatient neurology.   Harvest Dark, MD 05/15/22 1744

## 2022-05-15 NOTE — ED Notes (Signed)
Patient c/o "room spinning" while standing upright. Patient had poor balance, states, " if you weren't holding me, I'd fall forward." Patient had difficulty moving feet, states "feels like my legs are heavy." Sheran Luz, PA-C aware.

## 2022-05-15 NOTE — ED Notes (Signed)
Patient states he does not want to be admitted and wants to go home. Dr. Tobie Poet aware.

## 2022-05-15 NOTE — ED Notes (Signed)
Patient is alert and oriented. Patient given an urinal to use at bedside.

## 2022-05-15 NOTE — ED Triage Notes (Addendum)
Pt had a fall today. Recently in hospital last Feb for low Na. Pt on Eliquis for previous CABG

## 2022-05-15 NOTE — Hospital Course (Signed)
Mr. Laim Boogaard is a 77 year old male

## 2022-07-11 ENCOUNTER — Other Ambulatory Visit: Payer: Self-pay | Admitting: Cardiovascular Disease

## 2022-07-23 ENCOUNTER — Ambulatory Visit (INDEPENDENT_AMBULATORY_CARE_PROVIDER_SITE_OTHER): Payer: Medicare HMO | Admitting: Cardiovascular Disease

## 2022-07-23 ENCOUNTER — Encounter: Payer: Self-pay | Admitting: Cardiovascular Disease

## 2022-07-23 VITALS — BP 128/78 | HR 61 | Ht 72.0 in | Wt 204.0 lb

## 2022-07-23 DIAGNOSIS — I25119 Atherosclerotic heart disease of native coronary artery with unspecified angina pectoris: Secondary | ICD-10-CM

## 2022-07-23 DIAGNOSIS — I5042 Chronic combined systolic (congestive) and diastolic (congestive) heart failure: Secondary | ICD-10-CM

## 2022-07-23 DIAGNOSIS — I255 Ischemic cardiomyopathy: Secondary | ICD-10-CM | POA: Diagnosis not present

## 2022-07-23 DIAGNOSIS — I4891 Unspecified atrial fibrillation: Secondary | ICD-10-CM

## 2022-07-23 DIAGNOSIS — I10 Essential (primary) hypertension: Secondary | ICD-10-CM

## 2022-07-23 DIAGNOSIS — E782 Mixed hyperlipidemia: Secondary | ICD-10-CM

## 2022-07-23 NOTE — Assessment & Plan Note (Signed)
Well controlled today. Continue same medications.  

## 2022-07-23 NOTE — Assessment & Plan Note (Signed)
Denies chest pain 

## 2022-07-23 NOTE — Assessment & Plan Note (Signed)
Denies shortness of breath

## 2022-07-23 NOTE — Assessment & Plan Note (Signed)
In sinus rhythm on auscultation. Continue amiodarone, carvedilol, and Eliquis.

## 2022-07-23 NOTE — Progress Notes (Signed)
Cardiology Office Note   Date:  07/23/2022   ID:  Devin Lank Oluwatomisin Plueger., DOB Jun 24, 1945, MRN [id number]  PCP:  Dione Housekeeper, MD  Cardiologist:  Adrian Blackwater, MD      History of Present Illness: Devin Whittaker. is a 77 y.o. male who presents for  Chief Complaint  Patient presents with   Follow-up    3 month follow up    Patient in office for routine cardiac exam. Denies chest pain, shortness of breath, edema, palpitations.     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   Hypertension    Ischemic cardiomyopathy    MI, old    Peripheral neuropathy    PFO (patent foramen ovale) 05/2019   s/p repair   Sleep apnea    Spinal stenosis of lumbar region    TIA (transient ischemic attack)      Past Surgical History:  Procedure Laterality Date   APPENDECTOMY     BREAST SURGERY Right    benign mass   CARDIOVERSION Right 12/15/2019   Procedure: CARDIOVERSION;  Surgeon: Laurier Nancy, MD;  Location: [facility] ORS;  Service: Cardiovascular;  Laterality: Right;   CATARACT EXTRACTION, BILATERAL     COLONOSCOPY     CORONARY ANGIOPLASTY WITH STENT PLACEMENT     CORONARY ARTERY BYPASS GRAFT  05/2019   LIMA-LAD   LEFT HEART CATH AND CORS/GRAFTS ANGIOGRAPHY N/A 05/21/2019   Procedure: LEFT HEART CATH AND CORONARY ANGIOGRAPHY;  Surgeon: Laurier Nancy, MD;  Location: [facility] INVASIVE CV LAB;  Service: Cardiovascular;  Laterality: N/A;   LUMBAR LAMINECTOMY/DECOMPRESSION MICRODISCECTOMY N/A 07/04/2020   Procedure: L4-5 LAMINECTOMY;  Surgeon: Lucy Chris, MD;  Location: [facility] ORS;  Service: Neurosurgery;  Laterality: N/A;    REPLACEMENT TOTAL KNEE BILATERAL       Current Outpatient Medications  Medication Sig Dispense Refill   albuterol (VENTOLIN HFA) 108 (90 Base) MCG/ACT inhaler Inhale 2 puffs into the lungs every 6 (six) hours as needed for wheezing or shortness of breath.     amiodarone (PACERONE) 200 MG tablet Take 200 mg by mouth daily.     atorvastatin (LIPITOR) 80 MG tablet Take 80 mg by mouth daily.     buPROPion (WELLBUTRIN XL) 300 MG 24 hr tablet Take 300 mg by mouth daily.     carvedilol (COREG) 25 MG tablet Take 25 mg by mouth 2 (two) times daily with a meal.     Continuous Blood Gluc Receiver (FREESTYLE LIBRE 14 DAY READER) DEVI Use 1 Device as directed     ELIQUIS 5 MG TABS tablet TAKE 1 TABLET BY MOUTH TWICE A DAY 180 tablet 1   ENTRESTO 97-103 MG TAKE 1 TABLET BY MOUTH TWICE A DAY 180 tablet 1   EPINEPHrine 0.3 mg/0.3 mL IJ SOAJ injection Inject 0.3 mg into the muscle as needed for anaphylaxis.     ezetimibe (ZETIA) 10 MG tablet Take 10 mg by mouth daily.     fluticasone (FLONASE) 50 MCG/ACT nasal spray SPRAY 2 SPRAYS INTO EACH NOSTRIL EVERY DAY  gabapentin (NEURONTIN) 300 MG capsule Take 300 mg by mouth 2 (two) times daily.     glucose blood (PRECISION QID TEST) test strip Check fasting sugar once daily E11.9     Lancets (ONETOUCH DELICA PLUS LANCET33G) MISC Apply topically daily.     loratadine (CLARITIN) 10 MG tablet Take 10 mg by mouth daily.     metFORMIN (GLUCOPHAGE) 500 MG tablet Take 1,000 mg by mouth 2 (two) times daily.     nitroGLYCERIN (NITROSTAT) 0.4 MG SL tablet Place 0.4 mg under the tongue every 5 (five) minutes as needed for chest pain.     ONETOUCH ULTRA test strip CHECK FASTING SUGAR ONCE DAILY E11.9     No current facility-administered medications for this visit.    Allergies:   Morphine, Yellow jacket venom [bee venom], and Trazodone    Social History:   reports that he has quit smoking. He has never used smokeless tobacco. He reports that he does not currently  use alcohol. He reports that he does not use drugs.   Family History:  family history includes Osteoarthritis in his mother; Other (age of onset: 51) in his father.    ROS:     Review of Systems  Constitutional: Negative.   HENT: Negative.    Eyes: Negative.   Respiratory: Negative.    Cardiovascular: Negative.   Gastrointestinal: Negative.   Genitourinary: Negative.   Musculoskeletal: Negative.   Skin: Negative.   Neurological: Negative.   Endo/Heme/Allergies: Negative.   Psychiatric/Behavioral: Negative.    All other systems reviewed and are negative.   All other systems are reviewed and negative.   PHYSICAL EXAM: VS:  BP 128/78   Pulse 61   Ht 6' (1.829 m)   Wt 204 lb (92.5 kg)   SpO2 93%   BMI 27.67 kg/m  , BMI Body mass index is 27.67 kg/m. Last weight:  Wt Readings from Last 3 Encounters:  07/23/22 204 lb (92.5 kg)  05/15/22 198 lb (89.8 kg)  04/23/22 204 lb (92.5 kg)    Physical Exam Vitals reviewed.  Constitutional:      Appearance: Normal appearance. He is normal weight.  HENT:     Head: Normocephalic.     Nose: Nose normal.     Mouth/Throat:     Mouth: Mucous membranes are moist.  Eyes:     Pupils: Pupils are equal, round, and reactive to light.  Cardiovascular:     Rate and Rhythm: Normal rate and regular rhythm.     Pulses: Normal pulses.     Heart sounds: Normal heart sounds.  Pulmonary:     Effort: Pulmonary effort is normal.  Abdominal:     General: Abdomen is flat. Bowel sounds are normal.  Musculoskeletal:        General: Normal range of motion.     Cervical back: Normal range of motion.  Skin:    General: Skin is warm.  Neurological:     General: No focal deficit present.     Mental Status: He is alert.  Psychiatric:        Mood and Affect: Mood normal.      EKG: none today  Recent Labs: 04/13/2022: ALT 30 04/15/2022: Magnesium 2.1 05/15/2022: BUN 12; Creatinine, Ser 0.81; Hemoglobin 14.5; Platelets 286; Potassium 4.0; Sodium 131     Lipid Panel No results found for: "CHOL", "TRIG", "HDL", "CHOLHDL", "VLDL", "LDLCALC", "LDLDIRECT"    ASSESSMENT AND PLAN:    ICD-10-CM   1. Coronary artery disease involving native  coronary artery of native heart with angina pectoris (HCC)  I25.119     2. Chronic combined systolic and diastolic congestive heart failure (HCC)  I50.42     3. Primary hypertension  I10     4. Ischemic cardiomyopathy  I25.5     5. Mixed hyperlipidemia  E78.2     6. Atrial fibrillation status post cardioversion [facility])  I48.91        Problem List Items Addressed This Visit       Cardiovascular and Mediastinum   CAD (coronary artery disease) - Primary    Denies chest pain.       HTN (hypertension)    Well controlled today. Continue same medications.       Congestive heart failure (CHF) (HCC)    Denies shortness of breath.       Atrial fibrillation status post cardioversion [facility])    In sinus rhythm on auscultation. Continue amiodarone, carvedilol, and Eliquis.       Ischemic cardiomyopathy     Other   Hyperlipidemia     Disposition:   Return in about 4 months (around 11/23/2022).    Total time spent: 30 minutes  Signed,  Adrian Blackwater, MD  07/23/2022 1:33 PM    [facility]

## 2022-09-11 IMAGING — CT CT HEAD W/O CM
3 series · 14 of 47 positions shown, 16 images · non-contrast
Comparison: None.

CLINICAL DATA: Fall, altered mental status, neck injury

EXAM:
CT HEAD WITHOUT CONTRAST
CT CERVICAL SPINE WITHOUT CONTRAST
TECHNIQUE: Multidetector CT imaging of the head and cervical spine was
performed following the standard protocol without intravenous
contrast. Multiplanar CT image reconstructions of the cervical spine
were also generated.

[Series 3: head wo · axial · 0.44mm/px · z∈[-118,+12]mm · 8 of 32 slices shown, 10 images]
[im 3/32  brain]
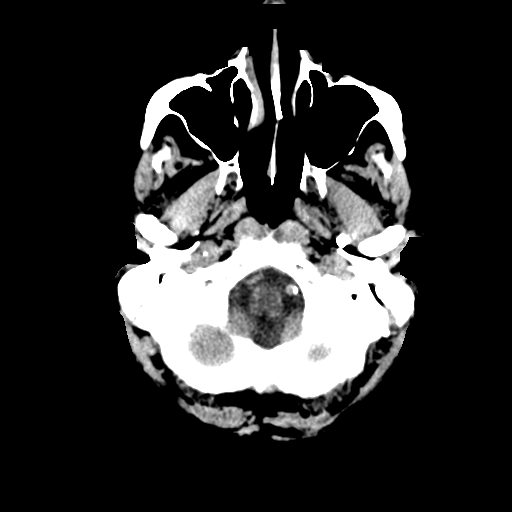
[im 3/32  bone]
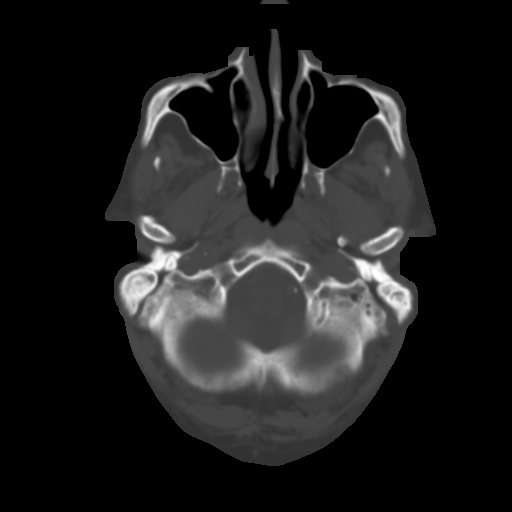
[im 7/32  brain]
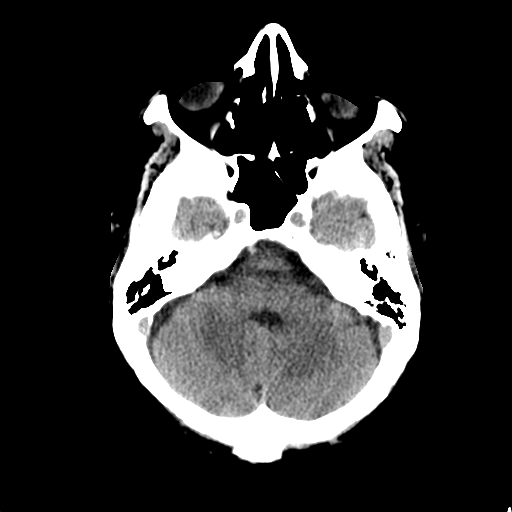
[im 10/32  brain]
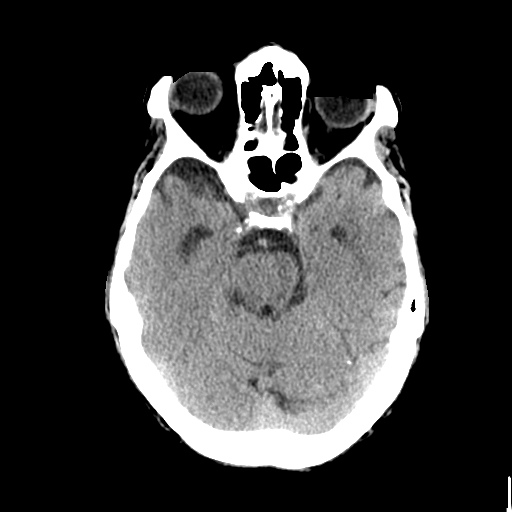
[im 14/32  brain]
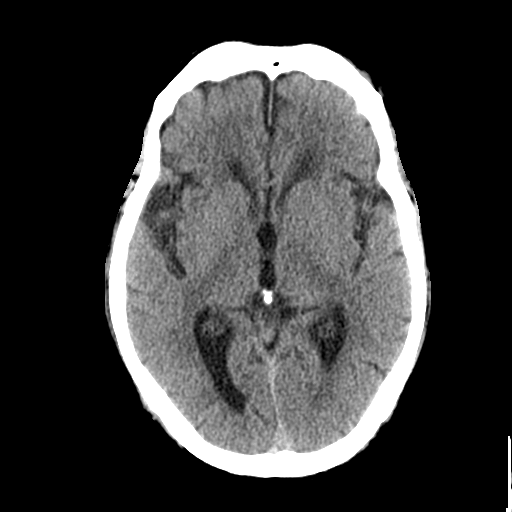
[im 18/32  brain]
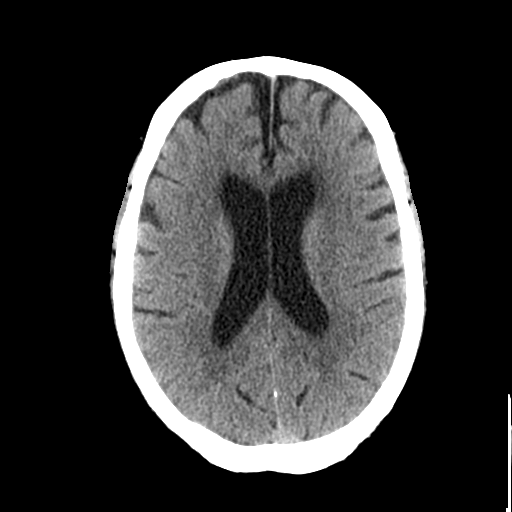
[im 18/32  bone]
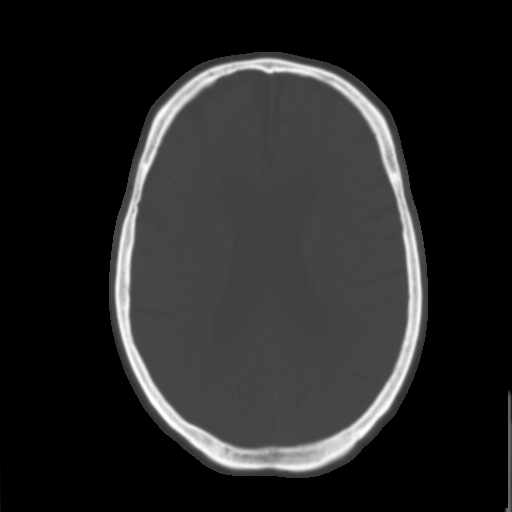
[im 22/32  brain]
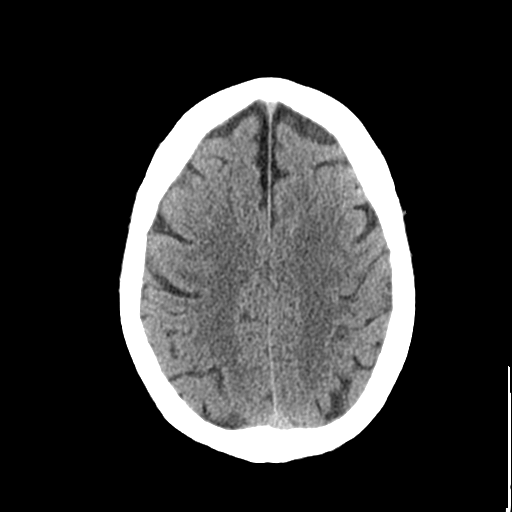
[im 25/32  brain]
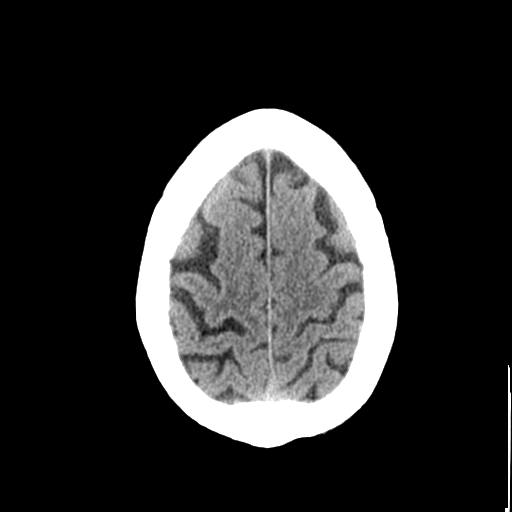
[im 29/32  brain]
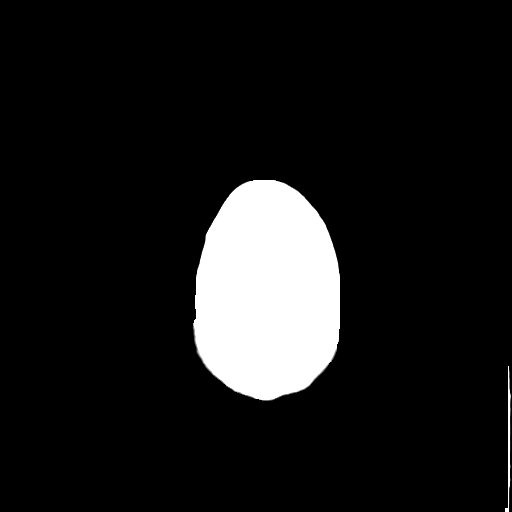

[Series 4: coronal soft tissue · coronal · 0.30mm/px · 3 of 73 slices shown]
[im 25/73  brain]
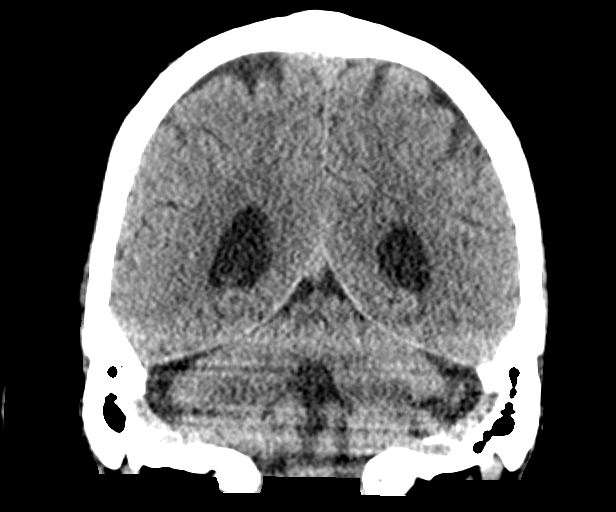
[im 33/73  brain]
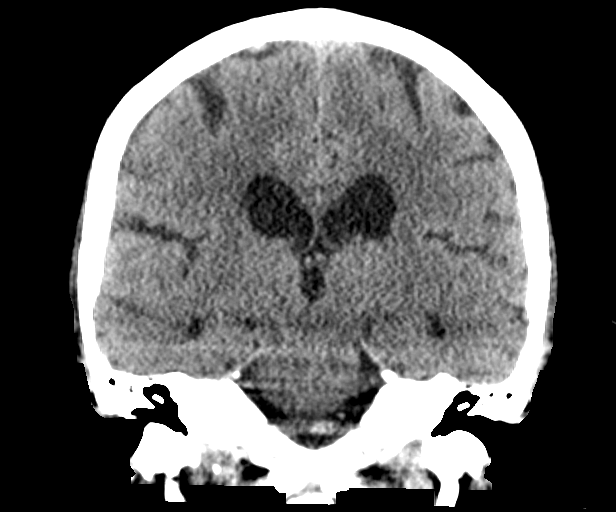
[im 41/73  brain]
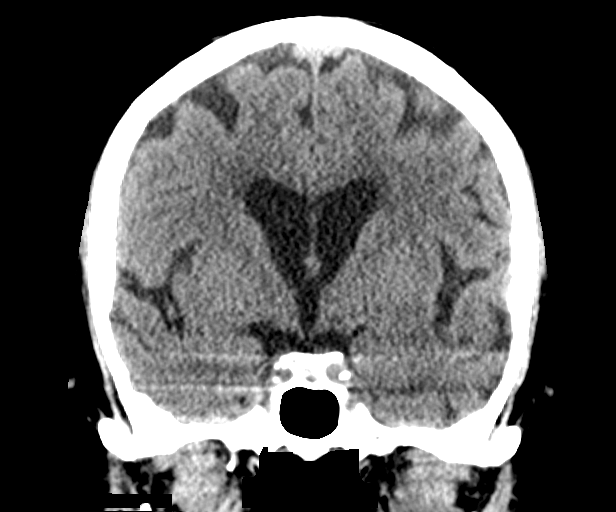

[Series 5: sagittal soft tissue · sagittal · 0.31mm/px · 3 of 61 slices shown]
[im 21/61  brain]
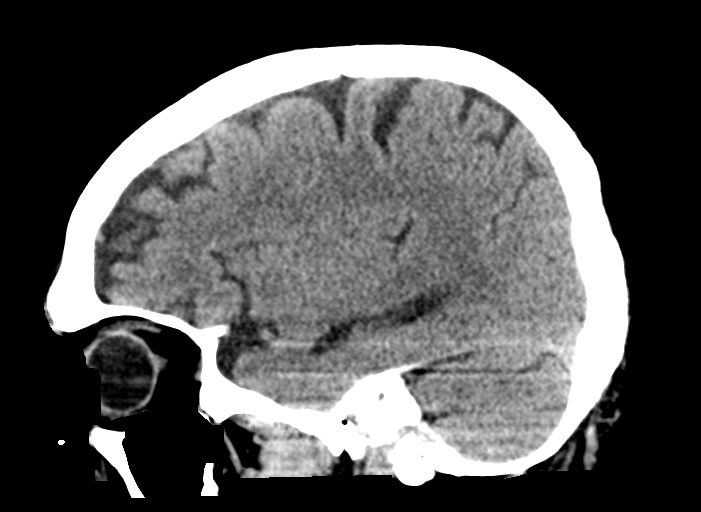
[im 31/61  brain]
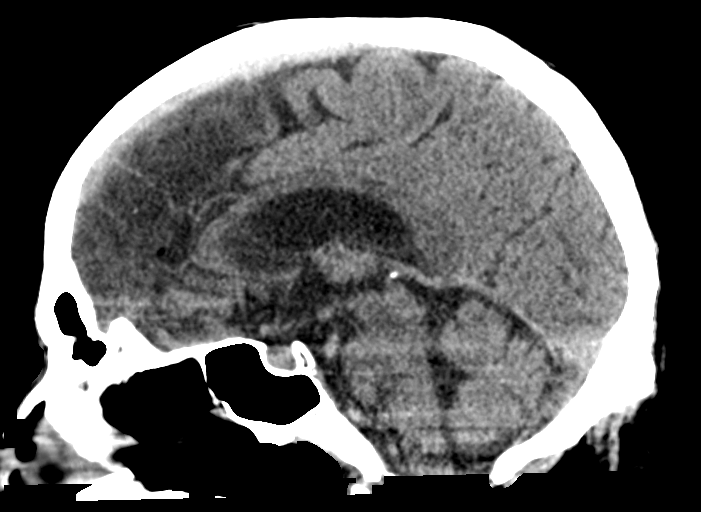
[im 41/61  brain]
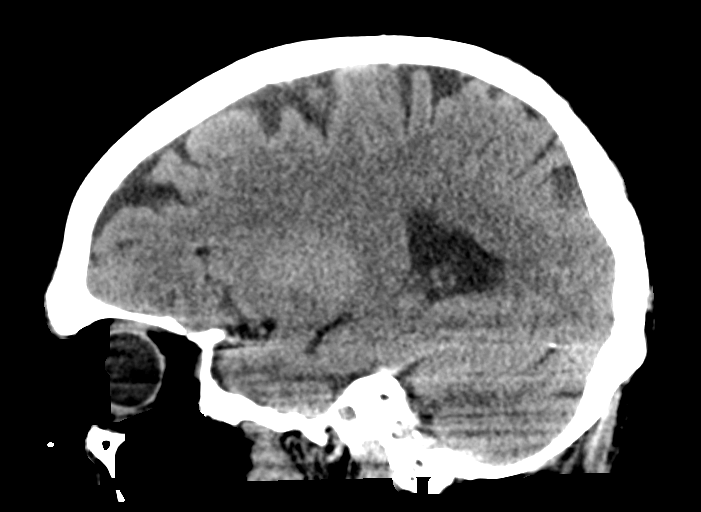

[14 of 47 positions shown; findings below may reference images not displayed]

FINDINGS: CT HEAD FINDINGS

Brain: Normal anatomic configuration. Parenchymal volume loss is
commensurate with the patient's age. Mild periventricular white
matter changes are present likely reflecting the sequela of small
vessel ischemia. No abnormal intra or extra-axial mass lesion or
fluid collection. No abnormal mass effect or midline shift. No
evidence of acute intracranial hemorrhage or infarct. Ventricular
size is normal. Cerebellum unremarkable.

Vascular: No asymmetric hyperdense vasculature at the skull base.

Skull: Intact

Sinuses/Orbits: Paranasal sinuses are clear. Orbits are
unremarkable.

Other: Mastoid air cells and middle ear cavities are clear.

CT CERVICAL SPINE FINDINGS

Alignment: Normal cervical lordosis.  No listhesis.

Skull base and vertebrae: The craniocervical junction is
unremarkable. Atlantal dental interval is normal. No acute fracture
of the cervical spine.

Soft tissues and spinal canal: No prevertebral fluid or swelling. No
visible canal hematoma. Posterior disc osteophyte complex at C4-5
effaces the anterior canal space and abuts the thecal sac without
remodeling. Posterior disc osteophyte complex at C5-6,
asymmetrically more severe on the left, mildly narrows the central
canal and abuts the thecal sac without remodeling. Broad-based
posterior disc osteophyte complex at C6-7 abuts and mildly flattens
the thecal sac in keeping with moderate central canal stenosis. The
AP diameter of the spinal canal at this level is 6-7 mm.

Disc levels: Review of the sagittal reformats demonstrates moderate
atlantodental degenerate arthritis. There is intervertebral disc
space narrowing and endplate remodeling throughout the cervical
spine, most severe at C4-C7 with prominent anterior disc osteophytes
noted, in keeping with mild to moderate degenerative disc disease.
Vertebral body height has been preserved. Review of the a axial
images demonstrates multilevel uncovertebral and facet arthrosis
resulting in moderate right neural foraminal narrowing at C2-3,
moderate to severe right and moderate left neural foraminal
narrowing at C3-4, moderate severe right neural foraminal narrowing
at C4-5, moderate left neural foraminal narrowing at C5-6, and
moderate bilateral neural foraminal narrowing at C6-7.

Upper chest: Unremarkable

Other: Remote left first and second rib fractures are noted.
IMPRESSION: No acute intracranial injury.  No calvarial fracture.

No acute fracture or listhesis of the cervical spine.

Multilevel degenerative disc and degenerative joint disease
resulting in moderate central canal stenosis at C6-7 and multilevel
moderate to severe neural foraminal narrowing as described above.

## 2022-10-05 ENCOUNTER — Other Ambulatory Visit (INDEPENDENT_AMBULATORY_CARE_PROVIDER_SITE_OTHER): Payer: Self-pay | Admitting: Vascular Surgery

## 2022-10-05 DIAGNOSIS — I7143 Infrarenal abdominal aortic aneurysm, without rupture: Secondary | ICD-10-CM

## 2022-10-08 ENCOUNTER — Ambulatory Visit (INDEPENDENT_AMBULATORY_CARE_PROVIDER_SITE_OTHER): Payer: Medicare HMO

## 2022-10-08 ENCOUNTER — Encounter (INDEPENDENT_AMBULATORY_CARE_PROVIDER_SITE_OTHER): Payer: Self-pay | Admitting: Vascular Surgery

## 2022-10-08 ENCOUNTER — Ambulatory Visit (INDEPENDENT_AMBULATORY_CARE_PROVIDER_SITE_OTHER): Payer: Medicare HMO | Admitting: Vascular Surgery

## 2022-10-08 VITALS — BP 116/72 | HR 78 | Resp 16 | Wt 202.0 lb

## 2022-10-08 DIAGNOSIS — I7143 Infrarenal abdominal aortic aneurysm, without rupture: Secondary | ICD-10-CM | POA: Diagnosis not present

## 2022-10-08 DIAGNOSIS — I4891 Unspecified atrial fibrillation: Secondary | ICD-10-CM

## 2022-10-08 DIAGNOSIS — M4726 Other spondylosis with radiculopathy, lumbar region: Secondary | ICD-10-CM

## 2022-10-08 DIAGNOSIS — I739 Peripheral vascular disease, unspecified: Secondary | ICD-10-CM

## 2022-10-08 DIAGNOSIS — I10 Essential (primary) hypertension: Secondary | ICD-10-CM

## 2022-10-08 DIAGNOSIS — I25119 Atherosclerotic heart disease of native coronary artery with unspecified angina pectoris: Secondary | ICD-10-CM

## 2022-10-08 NOTE — Progress Notes (Signed)
MRN : [id number]  Devin [name]. is a 77 y.o. (1945-11-20) male who presents with chief complaint of check circulation.  History of Present Illness:   The patient returns to the office for followup and review of the noninvasive studies.    The patient does note a worsening of his lower extremity pain.  He has pain that radiates down the back of his leg consistent with sciatica that is associated with low back pain and is exacerbated with walking but also with standing. No interval shortening of the patient's claudication distance or development of rest pain symptoms. No new ulcers or wounds have occurred since the last visit.   There have been no significant changes to the patient's overall health care.   The patient denies amaurosis fugax or recent TIA symptoms. There are no documented recent neurological changes noted. There is no history of DVT, PE or superficial thrombophlebitis. The patient denies recent episodes of angina or shortness of breath.    ABI Rt=1.09 and Lt=1.15  (triphasic signals throughout) Duplex ultrasound of the abdominal aorta shows 2.73 cm AAA  No outpatient medications have been marked as taking for the 10/08/22 encounter (Appointment) with Gilda Crease, Latina Craver, MD.    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   Hypertension    Ischemic cardiomyopathy    MI, old    Peripheral neuropathy    PFO (patent foramen ovale) 05/2019   s/p repair   Sleep apnea    Spinal stenosis of lumbar region    TIA (transient ischemic attack)      Past Surgical History:  Procedure Laterality Date   APPENDECTOMY     BREAST SURGERY Right    benign mass   CARDIOVERSION Right 12/15/2019   Procedure: CARDIOVERSION;  Surgeon: Laurier Nancy, MD;  Location: [facility] ORS;  Service: Cardiovascular;  Laterality: Right;   CATARACT EXTRACTION, BILATERAL     COLONOSCOPY     CORONARY ANGIOPLASTY WITH STENT PLACEMENT     CORONARY ARTERY BYPASS GRAFT  05/2019   LIMA-LAD   LEFT HEART CATH AND CORS/GRAFTS ANGIOGRAPHY N/A 05/21/2019   Procedure: LEFT HEART CATH AND CORONARY ANGIOGRAPHY;  Surgeon: Laurier Nancy, MD;  Location: [facility] INVASIVE CV LAB;  Service: Cardiovascular;  Laterality: N/A;   LUMBAR LAMINECTOMY/DECOMPRESSION MICRODISCECTOMY N/A 07/04/2020   Procedure: L4-5 LAMINECTOMY;  Surgeon: Lucy Chris, MD;  Location: [facility] ORS;  Service: Neurosurgery;  Laterality: N/A;   REPLACEMENT TOTAL KNEE BILATERAL      Social History Social History   Tobacco Use   Smoking status: Former   Smokeless tobacco: Never   Tobacco comments:    Quit over 40 years ago  Vaping Use   Vaping status: Never Used  Substance Use Topics   Alcohol use: Not Currently    Comment: social   Drug use: Never    Family History Family History  Problem Relation Age of Onset   Osteoarthritis Mother    Other Father 33       MVA    Allergies  Allergen Reactions   Morphine Swelling   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   Trazodone Other (See Comments)     REVIEW OF SYSTEMS (Negative unless checked)  Constitutional: [] Weight loss  [] Fever  [] Chills Cardiac: [] Chest pain   [] Chest pressure   [] Palpitations   [] Shortness of breath when laying flat   [] Shortness of breath with exertion. Vascular:  [x] Pain in legs with walking   [] Pain in legs at rest  [] History of DVT   [] Phlebitis   [] Swelling in legs   [] Varicose veins   [] Non-healing ulcers Pulmonary:   [] Uses home oxygen   [] Productive cough   [] Hemoptysis   [] Wheeze  [] COPD    [] Asthma Neurologic:  [] Dizziness   [] Seizures   [] History of stroke   [] History of TIA  [] Aphasia   [] Vissual changes   [] Weakness or numbness in arm   [] Weakness or numbness in leg Musculoskeletal:   [] Joint swelling   [] Joint pain   [] Low back pain Hematologic:  [] Easy bruising  [] Easy bleeding   [] Hypercoagulable state   [] Anemic Gastrointestinal:  [] Diarrhea   [] Vomiting  [] Gastroesophageal reflux/heartburn   [] Difficulty swallowing. Genitourinary:  [] Chronic kidney disease   [] Difficult urination  [] Frequent urination   [] Blood in urine Skin:  [] Rashes   [] Ulcers  Psychological:  [] History of anxiety   []  History of major depression.  Physical Examination  There were no vitals filed for this visit. There is no height or weight on file to calculate BMI. Gen: WD/WN, NAD Head: [place]/AT, No temporalis wasting.  Ear/Nose/Throat: Hearing grossly intact, nares w/o erythema or drainage Eyes: PER, EOMI, sclera nonicteric.  Neck: Supple, no masses.  No bruit or JVD.  Pulmonary:  Good air movement, no audible wheezing, no use of accessory muscles.  Cardiac: RRR, normal S1, S2, no Murmurs. Vascular:  mild trophic changes, no open wounds Vessel Right Left  Radial Palpable Palpable  PT Not Palpable Not Palpable  DP Not Palpable Not Palpable  Gastrointestinal: soft, non-distended. No guarding/no peritoneal signs.  Musculoskeletal: M/S 5/5 throughout.  No visible deformity.  Neurologic: CN 2-12 intact. Pain and light touch intact in extremities.  Symmetrical.  Speech is fluent. Motor exam as listed above. Psychiatric: Judgment intact, Mood & affect appropriate for pt's clinical situation. Dermatologic: No rashes or ulcers noted.  No changes consistent with cellulitis.   CBC Lab Results  Component Value Date   WBC 9.5 05/15/2022   HGB 14.5 05/15/2022   HCT 43.8 05/15/2022   MCV 90.5 05/15/2022   PLT 286 05/15/2022    BMET    Component Value Date/Time   NA 131 (L) 05/15/2022 1425   K  4.0 05/15/2022 1425   CL 97 (L) 05/15/2022 1425   CO2 23 05/15/2022 1425   GLUCOSE 96 05/15/2022 1425   BUN 12 05/15/2022 1425   CREATININE 0.81 05/15/2022 1425   CREATININE 1.07 11/09/2013 1359   CALCIUM 9.0 05/15/2022 1425   GFRNONAA >60 05/15/2022 1425   GFRNONAA >60 11/09/2013 1359   GFRAA >60 12/15/2019 0440   GFRAA >60 11/09/2013 1359   CrCl cannot be calculated (Patient's most recent lab result is  older than the maximum 21 days allowed.).  COAG Lab Results  Component Value Date   INR 1.0 06/24/2020   INR 1.0 05/04/2020   INR 1.2 12/15/2019    Radiology No results found.   Assessment/Plan 1. Infrarenal abdominal aortic aneurysm (AAA) without rupture (HCC) Recommend: No surgery or intervention is indicated at this time.  The patient has an asymptomatic abdominal aortic aneurysm that is less than 4 cm in maximal diameter.    I have reviewed the natural history of abdominal aortic aneurysm and the small risk of rupture for aneurysm less than 5 cm in size.  However, as these small aneurysms tend to enlarge over time, continued surveillance with ultrasound or CT scan is mandatory.   I have also discussed optimizing medical management with hypertension and lipid control and the importance of abstinence from tobacco.  The patient is also encouraged to exercise a minimum of 30 minutes 4 times a week.   Should the patient develop new onset abdominal or back pain or signs of peripheral embolization they are instructed to seek medical attention immediately and to alert the physician providing care that they have an aneurysm.   The patient voices their understanding.  The patient will return in 24 months with an aortic duplex. - VAS US AORTA/IVC/ILIACS; Future  2. PAD (peripheral artery disease) (HCC) Recommend:  I do not find evidence of life style limiting vascular disease. The patient specifically denies life style limitation.  Previous noninvasive studies including  ABI's of the legs do not identify critical vascular problems.  The patient should continue walking and begin a more formal exercise program. The patient should continue his antiplatelet therapy and aggressive treatment of the lipid abnormalities.  The patient is instructed to call the office if there is a significant change in the lower extremity symptoms, particularly if a wound develops or there is an abrupt increase in leg pain.  3. Osteoarthritis of spine with radiculopathy, lumbar region Continue NSAID medications as already ordered, these medications have been reviewed and there are no changes at this time.  Given the apparent worsening of his symptoms he has requested evaluation for his known degenerative LS spine disease.  I will place an ambulatory consult to neurosurgery  Continued activity and therapy was stressed. - Ambulatory referral to Neurosurgery  4. Atrial fibrillation status post cardioversion [facility]) Continue antiarrhythmia medications as already ordered, these medications have been reviewed and there are no changes at this time.  Continue anticoagulation as ordered by Cardiology Service  5. Coronary artery disease involving native coronary artery of native heart with angina pectoris (HCC) Continue cardiac and antihypertensive medications as already ordered and reviewed, no changes at this time.  Continue statin as ordered and reviewed, no changes at this time  Nitrates PRN for chest pain  6. Primary hypertension Continue antihypertensive medications as already ordered, these medications have been reviewed and there are no changes at this time.    Levora Dredge, MD  10/08/2022 1:54 PM

## 2022-10-12 ENCOUNTER — Encounter (INDEPENDENT_AMBULATORY_CARE_PROVIDER_SITE_OTHER): Payer: Self-pay | Admitting: Vascular Surgery

## 2022-10-12 DIAGNOSIS — M47816 Spondylosis without myelopathy or radiculopathy, lumbar region: Secondary | ICD-10-CM | POA: Insufficient documentation

## 2022-10-13 ENCOUNTER — Emergency Department: Payer: Medicare HMO

## 2022-10-13 ENCOUNTER — Other Ambulatory Visit: Payer: Self-pay

## 2022-10-13 ENCOUNTER — Encounter: Payer: Self-pay | Admitting: Emergency Medicine

## 2022-10-13 ENCOUNTER — Inpatient Hospital Stay
Admit: 2022-10-13 | Discharge: 2022-10-13 | Disposition: A | Discharge status: Home / Self Care | Payer: Medicare HMO | Attending: Family Medicine | Admitting: Family Medicine

## 2022-10-13 ENCOUNTER — Inpatient Hospital Stay
Admission: EM | Admit: 2022-10-13 | Discharge: 2022-10-16 | DRG: 322 | Disposition: A | Discharge status: Home / Self Care | Payer: Medicare HMO | Attending: Hospitalist | Admitting: Hospitalist

## 2022-10-13 DIAGNOSIS — R079 Chest pain, unspecified: Secondary | ICD-10-CM | POA: Diagnosis not present

## 2022-10-13 DIAGNOSIS — I4891 Unspecified atrial fibrillation: Secondary | ICD-10-CM | POA: Diagnosis present

## 2022-10-13 DIAGNOSIS — I2510 Atherosclerotic heart disease of native coronary artery without angina pectoris: Secondary | ICD-10-CM | POA: Diagnosis present

## 2022-10-13 DIAGNOSIS — I480 Paroxysmal atrial fibrillation: Secondary | ICD-10-CM | POA: Diagnosis present

## 2022-10-13 DIAGNOSIS — G4730 Sleep apnea, unspecified: Secondary | ICD-10-CM | POA: Diagnosis present

## 2022-10-13 DIAGNOSIS — I214 Non-ST elevation (NSTEMI) myocardial infarction: Principal | ICD-10-CM | POA: Diagnosis present

## 2022-10-13 DIAGNOSIS — E1169 Type 2 diabetes mellitus with other specified complication: Secondary | ICD-10-CM | POA: Diagnosis not present

## 2022-10-13 DIAGNOSIS — I482 Chronic atrial fibrillation, unspecified: Secondary | ICD-10-CM | POA: Diagnosis not present

## 2022-10-13 DIAGNOSIS — F419 Anxiety disorder, unspecified: Secondary | ICD-10-CM | POA: Diagnosis present

## 2022-10-13 DIAGNOSIS — I255 Ischemic cardiomyopathy: Secondary | ICD-10-CM | POA: Diagnosis present

## 2022-10-13 DIAGNOSIS — M1990 Unspecified osteoarthritis, unspecified site: Secondary | ICD-10-CM | POA: Diagnosis present

## 2022-10-13 DIAGNOSIS — I252 Old myocardial infarction: Secondary | ICD-10-CM

## 2022-10-13 DIAGNOSIS — Z8673 Personal history of transient ischemic attack (TIA), and cerebral infarction without residual deficits: Secondary | ICD-10-CM | POA: Diagnosis not present

## 2022-10-13 DIAGNOSIS — Z79899 Other long term (current) drug therapy: Secondary | ICD-10-CM

## 2022-10-13 DIAGNOSIS — Z7901 Long term (current) use of anticoagulants: Secondary | ICD-10-CM | POA: Diagnosis not present

## 2022-10-13 DIAGNOSIS — I5020 Unspecified systolic (congestive) heart failure: Secondary | ICD-10-CM | POA: Insufficient documentation

## 2022-10-13 DIAGNOSIS — E1142 Type 2 diabetes mellitus with diabetic polyneuropathy: Secondary | ICD-10-CM | POA: Diagnosis present

## 2022-10-13 DIAGNOSIS — I714 Abdominal aortic aneurysm, without rupture, unspecified: Secondary | ICD-10-CM | POA: Diagnosis present

## 2022-10-13 DIAGNOSIS — I110 Hypertensive heart disease with heart failure: Secondary | ICD-10-CM | POA: Diagnosis present

## 2022-10-13 DIAGNOSIS — Z96653 Presence of artificial knee joint, bilateral: Secondary | ICD-10-CM | POA: Diagnosis present

## 2022-10-13 DIAGNOSIS — Z87891 Personal history of nicotine dependence: Secondary | ICD-10-CM | POA: Diagnosis not present

## 2022-10-13 DIAGNOSIS — I700 Atherosclerosis of aorta: Secondary | ICD-10-CM | POA: Diagnosis present

## 2022-10-13 DIAGNOSIS — K219 Gastro-esophageal reflux disease without esophagitis: Secondary | ICD-10-CM | POA: Diagnosis present

## 2022-10-13 DIAGNOSIS — Z7984 Long term (current) use of oral hypoglycemic drugs: Secondary | ICD-10-CM

## 2022-10-13 DIAGNOSIS — F32A Depression, unspecified: Secondary | ICD-10-CM | POA: Diagnosis present

## 2022-10-13 DIAGNOSIS — E785 Hyperlipidemia, unspecified: Secondary | ICD-10-CM | POA: Diagnosis present

## 2022-10-13 DIAGNOSIS — Z91038 Other insect allergy status: Secondary | ICD-10-CM

## 2022-10-13 DIAGNOSIS — E119 Type 2 diabetes mellitus without complications: Secondary | ICD-10-CM

## 2022-10-13 DIAGNOSIS — Z885 Allergy status to narcotic agent status: Secondary | ICD-10-CM

## 2022-10-13 DIAGNOSIS — Z951 Presence of aortocoronary bypass graft: Secondary | ICD-10-CM | POA: Diagnosis not present

## 2022-10-13 DIAGNOSIS — I5022 Chronic systolic (congestive) heart failure: Secondary | ICD-10-CM | POA: Diagnosis present

## 2022-10-13 DIAGNOSIS — Z955 Presence of coronary angioplasty implant and graft: Secondary | ICD-10-CM

## 2022-10-13 DIAGNOSIS — I340 Nonrheumatic mitral (valve) insufficiency: Secondary | ICD-10-CM | POA: Diagnosis present

## 2022-10-13 DIAGNOSIS — Z888 Allergy status to other drugs, medicaments and biological substances status: Secondary | ICD-10-CM

## 2022-10-13 LAB — GLUCOSE, CAPILLARY
Glucose-Capillary: 121 mg/dL — ABNORMAL HIGH (ref 70–99)
Glucose-Capillary: 100 mg/dL — ABNORMAL HIGH (ref 70–99)
Glucose-Capillary: 108 mg/dL — ABNORMAL HIGH (ref 70–99)
Glucose-Capillary: 125 mg/dL — ABNORMAL HIGH (ref 70–99)

## 2022-10-13 LAB — CBC
HCT: 45 % (ref 39.0–52.0)
Hemoglobin: 15 g/dL (ref 13.0–17.0)
MCH: 30.3 pg (ref 26.0–34.0)
MCHC: 33.3 g/dL (ref 30.0–36.0)
MCV: 90.9 fL (ref 80.0–100.0)
Platelets: 406 10*3/uL — ABNORMAL HIGH (ref 150–400)
RBC: 4.95 MIL/uL (ref 4.22–5.81)
RDW: 14 % (ref 11.5–15.5)
WBC: 10.7 10*3/uL — ABNORMAL HIGH (ref 4.0–10.5)
nRBC: 0 % (ref 0.0–0.2)

## 2022-10-13 LAB — TROPONIN I (HIGH SENSITIVITY)
Troponin I (High Sensitivity): 728 ng/L (ref <18)
Troponin I (High Sensitivity): 632 ng/L (ref <18)
Troponin I (High Sensitivity): 634 ng/L (ref <18)
Troponin I (High Sensitivity): 751 ng/L (ref <18)

## 2022-10-13 LAB — ECHOCARDIOGRAM COMPLETE
AR max vel: 1.83 cm2
AV Peak grad: 6.6 mmHg
Ao pk vel: 1.28 m/s
Area-P 1/2: 2.82 cm2
Height: 72 in
S' Lateral: 3.8 cm
Single Plane A4C EF: 58.2 %
Weight: 3232 oz

## 2022-10-13 LAB — BASIC METABOLIC PANEL
Anion gap: 10 (ref 5–15)
BUN: 14 mg/dL (ref 8–23)
CO2: 23 mmol/L (ref 22–32)
Calcium: 9.2 mg/dL (ref 8.9–10.3)
Chloride: 101 mmol/L (ref 98–111)
Creatinine, Ser: 0.84 mg/dL (ref 0.61–1.24)
GFR, Estimated: >60 mL/min (ref >60)
Glucose, Bld: 107 mg/dL — ABNORMAL HIGH (ref 70–99)
Potassium: 4.7 mmol/L (ref 3.5–5.1)
Sodium: 134 mmol/L — ABNORMAL LOW (ref 135–145)

## 2022-10-13 LAB — HEMOGLOBIN A1C
Hgb A1c MFr Bld: 6.3 % — ABNORMAL HIGH (ref 4.8–5.6)
Mean Plasma Glucose: 134.11 mg/dL

## 2022-10-13 LAB — LIPID PANEL
Cholesterol: 105 mg/dL (ref 0–200)
HDL: 59 mg/dL (ref >40)
LDL Cholesterol: 35 mg/dL (ref 0–99)
Total CHOL/HDL Ratio: 1.8 RATIO
Triglycerides: 55 mg/dL (ref <150)
VLDL: 11 mg/dL (ref 0–40)

## 2022-10-13 LAB — PROTIME-INR
INR: 1.2 (ref 0.8–1.2)
Prothrombin Time: 15.1 seconds (ref 11.4–15.2)

## 2022-10-13 LAB — HEPARIN LEVEL (UNFRACTIONATED): Heparin Unfractionated: >1.1 IU/mL — ABNORMAL HIGH (ref 0.30–0.70)

## 2022-10-13 LAB — APTT
aPTT: 38 seconds — ABNORMAL HIGH (ref 24–36)
aPTT: 92 seconds — ABNORMAL HIGH (ref 24–36)
aPTT: 88 seconds — ABNORMAL HIGH (ref 24–36)

## 2022-10-13 MED ORDER — INSULIN ASPART 100 UNIT/ML IJ SOLN: 0.0000 [IU] | INTRAMUSCULAR | Status: DC

## 2022-10-13 MED ORDER — NITROGLYCERIN 2 % TD OINT
1.0000 [in_us] | TOPICAL_OINTMENT | Freq: Once | TRANSDERMAL | Status: AC
  Administered 2022-10-13: 1 [in_us] via TOPICAL
  Filled 2022-10-13: qty 1

## 2022-10-13 MED ORDER — ATORVASTATIN CALCIUM 80 MG PO TABS
80.0000 mg | ORAL_TABLET | Freq: Every day | ORAL | Status: DC
  Administered 2022-10-13 – 2022-10-16 (×3): 80 mg via ORAL
  Filled 2022-10-13 (×3): qty 1

## 2022-10-13 MED ORDER — HYDROMORPHONE HCL 1 MG/ML IJ SOLN: 0.5000 mg | INTRAMUSCULAR | Status: AC | PRN

## 2022-10-13 MED ORDER — ASPIRIN 81 MG PO CHEW
324.0000 mg | CHEWABLE_TABLET | Freq: Once | ORAL | Status: AC
  Administered 2022-10-13: 324 mg via ORAL
  Filled 2022-10-13: qty 4

## 2022-10-13 MED ORDER — FENTANYL CITRATE PF 50 MCG/ML IJ SOSY
50.0000 ug | PREFILLED_SYRINGE | Freq: Once | INTRAMUSCULAR | Status: AC
  Administered 2022-10-13: 50 ug via INTRAVENOUS
  Filled 2022-10-13: qty 1

## 2022-10-13 MED ORDER — INSULIN ASPART 100 UNIT/ML IJ SOLN
0.0000 [IU] | Freq: Three times a day (TID) | INTRAMUSCULAR | Status: DC
  Filled 2022-10-13: qty 1

## 2022-10-13 MED ORDER — EZETIMIBE 10 MG PO TABS
10.0000 mg | ORAL_TABLET | Freq: Every day | ORAL | Status: DC
  Administered 2022-10-13 – 2022-10-16 (×3): 10 mg via ORAL
  Filled 2022-10-13 (×3): qty 1

## 2022-10-13 MED ORDER — HYDROMORPHONE HCL 1 MG/ML IJ SOLN
0.5000 mg | INTRAMUSCULAR | Status: AC | PRN
  Administered 2022-10-14: 0.5 mg via INTRAVENOUS
  Filled 2022-10-13: qty 0.5

## 2022-10-13 MED ORDER — SODIUM CHLORIDE 0.9 % IV BOLUS
500.0000 mL | Freq: Once | INTRAVENOUS | Status: AC
  Administered 2022-10-13: 500 mL via INTRAVENOUS

## 2022-10-13 MED ORDER — ONDANSETRON HCL 4 MG/2ML IJ SOLN: 4.0000 mg | Freq: Four times a day (QID) | INTRAMUSCULAR | Status: DC | PRN

## 2022-10-13 MED ORDER — ACETAMINOPHEN 325 MG PO TABS
650.0000 mg | ORAL_TABLET | ORAL | Status: DC | PRN
  Administered 2022-10-14 (×2): 650 mg via ORAL
  Filled 2022-10-13: qty 2

## 2022-10-13 MED ORDER — AMIODARONE HCL 200 MG PO TABS
200.0000 mg | ORAL_TABLET | Freq: Every day | ORAL | Status: DC
  Administered 2022-10-13 – 2022-10-16 (×3): 200 mg via ORAL
  Filled 2022-10-13 (×3): qty 1

## 2022-10-13 MED ORDER — NITROGLYCERIN IN D5W 200-5 MCG/ML-% IV SOLN
0.0000 ug/min | INTRAVENOUS | Status: DC
  Administered 2022-10-13: 5 ug/min via INTRAVENOUS
  Filled 2022-10-13: qty 250

## 2022-10-13 MED ORDER — ONDANSETRON HCL 4 MG/2ML IJ SOLN
4.0000 mg | Freq: Once | INTRAMUSCULAR | Status: AC
  Administered 2022-10-13: 4 mg via INTRAVENOUS
  Filled 2022-10-13: qty 2

## 2022-10-13 MED ORDER — ONDANSETRON HCL 4 MG/2ML IJ SOLN: 4.0000 mg | Freq: Three times a day (TID) | INTRAMUSCULAR | Status: DC | PRN

## 2022-10-13 MED ORDER — HEPARIN BOLUS VIA INFUSION
4000.0000 [IU] | Freq: Once | INTRAVENOUS | Status: AC
  Administered 2022-10-13: 4000 [IU] via INTRAVENOUS
  Filled 2022-10-13: qty 4000

## 2022-10-13 MED ORDER — METOPROLOL TARTRATE 25 MG PO TABS
25.0000 mg | ORAL_TABLET | Freq: Two times a day (BID) | ORAL | Status: DC
  Administered 2022-10-13 – 2022-10-16 (×4): 25 mg via ORAL
  Filled 2022-10-13 (×5): qty 1

## 2022-10-13 MED ORDER — HEPARIN (PORCINE) 25000 UT/250ML-% IV SOLN
1250.0000 [IU]/h | INTRAVENOUS | Status: DC
  Administered 2022-10-13 – 2022-10-14 (×3): 1250 [IU]/h via INTRAVENOUS
  Filled 2022-10-13 (×3): qty 250

## 2022-10-13 NOTE — Assessment & Plan Note (Signed)
2D echo March 2021 with a EF of 40 to 45% grade 1 diastolic dysfunction Appears euvolemic at present Follow

## 2022-10-13 NOTE — H&P (Signed)
History and Physical    Patient: Devin [name]. UEA:[id number] DOB: 04-Jul-1945 DOA: 10/13/2022 DOS: the patient was seen and examined on 10/13/2022 PCP: Dione Housekeeper, MD  Patient coming from: Home  Chief Complaint:  Chief Complaint  Patient presents with   Chest Pain   HPI: Devin [name]. is a 77 y.o. male with medical history significant of multiple medical issues including coronary artery disease status post CABG March 2021, atrial fibrillation status post cardioversion on anticoagulation, AAA, HFrEF, type 2 diabetes, hypertension, hyperlipidemia presenting with NSTEMI.  Patient reports recurring moderate chest pain over the past 4 days.  Chest pain is been intermittent and recurring.  Intensity has mildly progressed over this timeframe.  No reported nausea or diaphoresis.  Intensity became more severe over the past 24 hours-similar to prior episode of chest pain that led to CABG.  Took home nitroglycerin with some improvement in symptoms.  EMS and subsequently called.  No abdominal pain, diarrhea, no shortness of breath, hemiparesis or confusion.  Non-smoker.  Has been compliant with home regimen including Eliquis and statin. Presented to the ER afebrile, hemodynamically stable.  White count 10.7, hemoglobin 15, platelets 406, creatinine 0.84.  Troponin 7 28-6 32.  Per Dr. Erma Heritage note "Dr. Darrold Junker with Cardiology consulted and will see pt this morning".  Review of Systems: As mentioned in the history of present illness. All other systems reviewed and are negative. 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    Hypertension    Ischemic cardiomyopathy    MI, old    Peripheral neuropathy    PFO (patent foramen ovale) 05/2019   s/p repair   Sleep apnea    Spinal stenosis of lumbar region    TIA (transient ischemic attack)    Past Surgical History:  Procedure Laterality Date   APPENDECTOMY     BREAST SURGERY Right    benign mass   CARDIOVERSION Right 12/15/2019   Procedure: CARDIOVERSION;  Surgeon: Laurier Nancy, MD;  Location: [facility] ORS;  Service: Cardiovascular;  Laterality: Right;   CATARACT EXTRACTION, BILATERAL     COLONOSCOPY     CORONARY ANGIOPLASTY WITH STENT PLACEMENT     CORONARY ARTERY BYPASS GRAFT  05/2019   LIMA-LAD   LEFT HEART CATH AND CORS/GRAFTS ANGIOGRAPHY N/A 05/21/2019   Procedure: LEFT HEART CATH AND CORONARY ANGIOGRAPHY;  Surgeon: Laurier Nancy, MD;  Location: [facility] INVASIVE CV [facility];  Service: Cardiovascular;  Laterality: N/A;   LUMBAR LAMINECTOMY/DECOMPRESSION MICRODISCECTOMY N/A 07/04/2020   Procedure: L4-5 LAMINECTOMY;  Surgeon: Lucy Chris, MD;  Location: [facility] ORS;  Service: Neurosurgery;  Laterality: N/A;   REPLACEMENT TOTAL KNEE BILATERAL     Social History:  reports that he has quit smoking. He has never used smokeless tobacco. He reports that he does not currently use alcohol. He reports that he does not use drugs.  Allergies  Allergen Reactions   Morphine Swelling   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   Trazodone Other (See Comments)    Family History  Problem Relation Age of Onset  Osteoarthritis Mother    Other Father 73       MVA    Prior to Admission medications   Medication Sig Start Date End Date Taking? Authorizing Provider  albuterol (VENTOLIN HFA) 108 (90 Base) MCG/ACT inhaler Inhale 2 puffs into the lungs every 6 (six) hours as needed for wheezing or shortness of breath.    [provider]  amiodarone (PACERONE) 200 MG tablet Take 200 mg by mouth daily.    [provider]  atorvastatin (LIPITOR) 80 MG tablet  Take 80 mg by mouth daily. 04/17/21   [provider]  buPROPion (WELLBUTRIN XL) 300 MG 24 hr tablet Take 300 mg by mouth daily. 04/17/21   [provider]  carvedilol (COREG) 25 MG tablet Take 25 mg by mouth 2 (two) times daily with a meal. Patient not taking: Reported on 10/08/2022    [provider]  Continuous Blood Gluc Receiver (FREESTYLE LIBRE 14 DAY READER) DEVI Use 1 Device as directed 10/24/20   [provider]  ELIQUIS 5 MG TABS tablet TAKE 1 TABLET BY MOUTH TWICE A DAY 05/10/22   Laurier Nancy, MD  ENTRESTO 97-103 MG TAKE 1 TABLET BY MOUTH TWICE A DAY 07/12/22   Adrian Blackwater A, MD  EPINEPHrine 0.3 mg/0.3 mL IJ SOAJ injection Inject 0.3 mg into the muscle as needed for anaphylaxis.    [provider]  ezetimibe (ZETIA) 10 MG tablet Take 10 mg by mouth daily. 04/06/19   [provider]  fluticasone (FLONASE) 50 MCG/ACT nasal spray SPRAY 2 SPRAYS INTO EACH NOSTRIL EVERY DAY 03/09/21   [provider]  gabapentin (NEURONTIN) 300 MG capsule Take 300 mg by mouth 2 (two) times daily. 03/20/21   [provider]  glucose blood (PRECISION QID TEST) test strip Check fasting sugar once daily E11.9 11/29/20   [provider]  Lancets (ONETOUCH DELICA PLUS [place]) MISC Apply topically daily. 02/19/21   [provider]  loratadine (CLARITIN) 10 MG tablet Take 10 mg by mouth daily. 02/28/22   [provider]  metFORMIN (GLUCOPHAGE) 500 MG tablet Take 1,000 mg by mouth 2 (two) times daily. 02/17/19   [provider]  nitroGLYCERIN (NITROSTAT) 0.4 MG SL tablet Place 0.4 mg under the tongue every 5 (five) minutes as needed for chest pain.    [provider]  [facility] ULTRA test strip CHECK FASTING SUGAR ONCE DAILY E11.9 11/29/20   [provider]  benazepril (LOTENSIN) 20 MG tablet Take 20 mg by mouth daily. 03/19/19 08/13/19  [provider]    Physical Exam: Vitals:   10/13/22  0700 10/13/22 0715 10/13/22 0730 10/13/22 0810  BP: 98/62  100/63 118/67  Pulse: 80  80 81  Resp: 13  15 18   Temp:  97.6 F (36.4 C)  (!) 97.5 F (36.4 C)  TempSrc:  Oral  Oral  SpO2: 94%  95% 98%  Weight:      Height:       Physical Exam Constitutional:      Appearance: He is normal weight.  HENT:     Head: Normocephalic and atraumatic.     Nose: Nose normal.  Eyes:     Pupils: Pupils are equal, round, and reactive to light.  Cardiovascular:     Rate and Rhythm: Normal rate and regular rhythm.  Pulmonary:     Effort: Pulmonary effort is normal.  Abdominal:     General: Bowel sounds are normal.  Musculoskeletal:  General: Normal range of motion.  Skin:    General: Skin is warm.  Neurological:     General: No focal deficit present.     Data Reviewed:  There are no new results to review at this time. DG Chest 2 View CLINICAL DATA:  Chest pain.  EXAM: CHEST - 2 VIEW  COMPARISON:  04/07/2020  FINDINGS: Lungs are clear. No pneumothorax or pleural effusion. Coronary artery bypass grafting has been performed. Cardiac size within normal limits. Pulmonary vascularity is normal. Neurostimulator battery pack overlies the right hemithorax with single lead extending to the right neck base, unchanged. Stable retained lead within the right chest wall. Osseous structures are age-appropriate. No acute bone abnormality.  IMPRESSION: 1. No active cardiopulmonary disease.  Electronically Signed   By: Helyn Numbers M.D.   On: 10/13/2022 04:14  Lab Results  Component Value Date   WBC 10.7 (H) 10/13/2022   HGB 15.0 10/13/2022   HCT 45.0 10/13/2022   MCV 90.9 10/13/2022   PLT 406 (H) 10/13/2022   Last metabolic panel Lab Results  Component Value Date   GLUCOSE 107 (H) 10/13/2022   NA 134 (L) 10/13/2022   K 4.7 10/13/2022   CL 101 10/13/2022   CO2 23 10/13/2022   BUN 14 10/13/2022   CREATININE 0.84 10/13/2022   GFRNONAA >60 10/13/2022   CALCIUM 9.2  10/13/2022   PROT 6.6 04/13/2022   ALBUMIN 3.9 04/13/2022   BILITOT 1.1 04/13/2022   ALKPHOS 50 04/13/2022   AST 30 04/13/2022   ALT 30 04/13/2022   ANIONGAP 10 10/13/2022    Assessment and Plan: * NSTEMI (non-ST elevated myocardial infarction) (HCC) Recurrent moderate chest pain over the past 4 days has progressively worsened in the setting of prior coronary disease status post CABG March 2021 Positive symptomatic improvement with nitroglycerin at home Troponin 728-->632 EKG grossly stable Started on heparin and nitro drip in the ER in the setting of persistent chest pain Per Dr. Erma Heritage note, Dr. Darrold Junker with cardiology will be evaluating the patient Anticipate possible cardiac catheterization Follow-up formal cardiology recommendations 2D echo ordered   Atrial fibrillation status post cardioversion [facility]) On heparin drip in the setting of NSTEMI Hold Eliquis Continue amiodarone Holding Coreg as patient reports significant bradycardia with use  HFrEF (heart failure with reduced ejection fraction) (HCC) 2D echo March 2021 with a EF of 40 to 45% grade 1 diastolic dysfunction Appears euvolemic at present Follow  Hyperlipidemia Lipid panel Continue statin  AAA (abdominal aortic aneurysm) (HCC) Followed by Dr. Gilda Crease in the outpatient setting-asymptomatic 4 cm AAA as of July 2024 office visit Monitor  GERD (gastroesophageal reflux disease) PPI  Controlled type 2 diabetes mellitus without complication, without long-term current use of insulin (HCC) SSI  A1C        Advance Care Planning:   Code Status: Full Code   Consults: Pending cardiology eval w/ Dr. Darrold Junker   Family Communication: No family at the bedside   Severity of Illness: The appropriate patient status for this patient is INPATIENT. Inpatient status is judged to be reasonable and necessary in order to provide the required intensity of service to ensure the patient's safety. The patient's presenting  symptoms, physical exam findings, and initial radiographic and laboratory data in the context of their chronic comorbidities is felt to place them at high risk for further clinical deterioration. Furthermore, it is not anticipated that the patient will be medically stable for discharge from the hospital within 2 midnights of admission.   *  I certify that at the point of admission it is my clinical judgment that the patient will require inpatient hospital care spanning beyond 2 midnights from the point of admission due to high intensity of service, high risk for further deterioration and high frequency of surveillance required.*  Author: Floydene Flock, MD 10/13/2022 8:41 AM  For on call review [email or web address].

## 2022-10-13 NOTE — Progress Notes (Signed)
ANTICOAGULATION CONSULT NOTE -   Pharmacy Consult for Heparin  Indication: ACS/NSTEMI   Allergies  Allergen Reactions   Morphine Swelling   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   Trazodone Other (See Comments)    Patient Measurements: Height: 6' (182.9 cm) Weight: 91.6 kg (202 lb) IBW/kg (Calculated) : 77.6 Heparin Dosing Weight: 91.6 kg   Vital Signs: Temp: 97.7 F (36.5 C) (08/03 1247) Temp Source: Oral (08/03 1247) BP: 107/71 (08/03 1247) Pulse Rate: 72 (08/03 1247)  Labs: Recent Labs    10/13/22 0313 10/13/22 0518 10/13/22 1300  HGB 15.0  --   --   HCT 45.0  --   --   PLT 406*  --   --   APTT 38*  --  92*  LABPROT 15.1  --   --   INR 1.2  --   --   HEPARINUNFRC >1.10*  --   --   CREATININE 0.84  --   --   TROPONINIHS 728* 632*  --     Estimated Creatinine Clearance: 79.6 mL/min (by C-G formula based on SCr of 0.84 mg/dL).   Medical History: 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   Hypertension    Ischemic cardiomyopathy    MI, old    Peripheral neuropathy    PFO (patent foramen ovale) 05/2019   s/p repair   Sleep apnea    Spinal stenosis of lumbar region    TIA (transient ischemic attack)     Medications:  Medications Prior to Admission  Medication Sig Dispense Refill Last Dose   albuterol (VENTOLIN HFA) 108 (90 Base) MCG/ACT inhaler Inhale 2 puffs into the lungs every 6 (six) hours as needed for wheezing or shortness of breath.      amiodarone (PACERONE) 200 MG tablet Take 200 mg by mouth daily.      atorvastatin (LIPITOR) 80 MG tablet Take 80 mg by mouth daily.      buPROPion  (WELLBUTRIN XL) 300 MG 24 hr tablet Take 300 mg by mouth daily.      Continuous Blood Gluc Receiver (FREESTYLE LIBRE 14 DAY READER) DEVI Use 1 Device as directed      ELIQUIS 5 MG TABS tablet TAKE 1 TABLET BY MOUTH TWICE A DAY 180 tablet 1    ENTRESTO 97-103 MG TAKE 1 TABLET BY MOUTH TWICE A DAY 180 tablet 1    EPINEPHrine 0.3 mg/0.3 mL IJ SOAJ injection Inject 0.3 mg into the muscle as needed for anaphylaxis.      ezetimibe (ZETIA) 10 MG tablet Take 10 mg by mouth daily.      fluticasone (FLONASE) 50 MCG/ACT nasal spray SPRAY 2 SPRAYS INTO EACH NOSTRIL EVERY DAY      gabapentin (NEURONTIN) 300 MG capsule Take 300 mg by mouth 2 (two) times daily.      glucose blood (PRECISION QID TEST) test strip Check fasting sugar once daily E11.9      Lancets (ONETOUCH DELICA PLUS LANCET33G) MISC Apply topically daily.      loratadine (CLARITIN) 10 MG tablet Take 10  mg by mouth daily.      metFORMIN (GLUCOPHAGE) 500 MG tablet Take 1,000 mg by mouth 2 (two) times daily.      nitroGLYCERIN (NITROSTAT) 0.4 MG SL tablet Place 0.4 mg under the tongue every 5 (five) minutes as needed for chest pain.      ONETOUCH ULTRA test strip CHECK FASTING SUGAR ONCE DAILY E11.9       Assessment: Pharmacy consulted to dose heparin in this 77 year old male admitted with ACS/NSTEMI.  Pt was on Eliquis 5 mg PO BID PTA.  Pt unsure of exact time of last dose but "thinks it was last night (8/2 PM)".   CrCl = 79.6 ml/min   8/3 1300 aPTT=92 Therapeutic x 1   Goal of Therapy:  Heparin level 0.3-0.7 units/ml aPTT 66 - 102  seconds Monitor platelets by anticoagulation protocol: Yes   Plan:  8/3 1300 aPTT=92 Therapeutic x 1 continue heparin infusion at 1250 units/hr -check confirmatory aPTT in 8 hrs - Will use aPTT to guide dosing until correlating with HL, check HL daily  - Check HL on 8/4 with AM labs.   [name] 10/13/2022,1:47 PM

## 2022-10-13 NOTE — Assessment & Plan Note (Signed)
SSI  A1C  

## 2022-10-13 NOTE — Assessment & Plan Note (Signed)
PPI ?

## 2022-10-13 NOTE — ED Notes (Signed)
Pt reporting increased pain to chest and in left jaw 6/10. Repeat EKG performed. Dr Erma Heritage notified.

## 2022-10-13 NOTE — ED Notes (Signed)
Patient resting comfortably, denies chest pain at this time.

## 2022-10-13 NOTE — ED Notes (Signed)
Called floor to get care handoff done due to ready bed being assigned for pt for 45 minutes. After being transferred to the nurse and requesting handoff, nurse stated, "Oh, I didn't think you were calling to give report. That would be unusual. I'm working on it," and hung up on this [employer].

## 2022-10-13 NOTE — ED Triage Notes (Signed)
  Patient BIB EMS for chest pain that has been going on for 4 days.  Patient has hx of CABG and multiple stent placement.  Patient states he had R sided chest discomfort and took 1 nitroglycerin SL before calling EMS.  Patient states pain is almost subsided at this time.

## 2022-10-13 NOTE — Assessment & Plan Note (Signed)
Followed by Dr. Gilda Crease in the outpatient setting-asymptomatic 4 cm AAA as of July 2024 office visit Monitor

## 2022-10-13 NOTE — Progress Notes (Signed)
  Echocardiogram 2D Echocardiogram has been performed.  Devin [name] 10/13/2022, 11:19 AM

## 2022-10-13 NOTE — Progress Notes (Signed)
ANTICOAGULATION CONSULT NOTE -   Pharmacy Consult for Heparin  Indication: ACS/NSTEMI   Allergies  Allergen Reactions   Morphine Swelling   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   Trazodone Other (See Comments)    Patient Measurements: Height: 6' (182.9 cm) Weight: 91.6 kg (202 lb) IBW/kg (Calculated) : 77.6 Heparin Dosing Weight: 91.6 kg   Vital Signs: Temp: 97.7 F (36.5 C) (08/03 2016) Temp Source: Oral (08/03 2016) BP: 105/95 (08/03 2016) Pulse Rate: 84 (08/03 1800)  Labs: Recent Labs    10/13/22 0313 10/13/22 0518 10/13/22 1300 10/13/22 1946 10/13/22 2052  HGB 15.0  --   --   --   --   HCT 45.0  --   --   --   --   PLT 406*  --   --   --   --   APTT 38*  --  92*  --  88*  LABPROT 15.1  --   --   --   --   INR 1.2  --   --   --   --   HEPARINUNFRC >1.10*  --   --   --   --   CREATININE 0.84  --   --   --   --   TROPONINIHS 728* 632*  --  634*  --     Estimated Creatinine Clearance: 79.6 mL/min (by C-G formula based on SCr of 0.84 mg/dL).   Medical History: 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   Hypertension    Ischemic cardiomyopathy    MI, old    Peripheral neuropathy    PFO (patent foramen ovale) 05/2019   s/p repair   Sleep apnea    Spinal stenosis of lumbar region    TIA (transient ischemic attack)     Medications:  Medications Prior to Admission  Medication Sig Dispense Refill Last Dose   albuterol (VENTOLIN HFA) 108 (90 Base) MCG/ACT inhaler Inhale 2 puffs into the lungs every 6 (six) hours as needed for wheezing or shortness of breath.      amiodarone (PACERONE) 200 MG tablet  Take 200 mg by mouth daily.      atorvastatin (LIPITOR) 80 MG tablet Take 80 mg by mouth daily.      buPROPion (WELLBUTRIN XL) 300 MG 24 hr tablet Take 300 mg by mouth daily.      ELIQUIS 5 MG TABS tablet TAKE 1 TABLET BY MOUTH TWICE A DAY 180 tablet 1    ENTRESTO 97-103 MG TAKE 1 TABLET BY MOUTH TWICE A DAY 180 tablet 1    EPINEPHrine 0.3 mg/0.3 mL IJ SOAJ injection Inject 0.3 mg into the muscle as needed for anaphylaxis.      ezetimibe (ZETIA) 10 MG tablet Take 10 mg by mouth daily.      fluticasone (FLONASE) 50 MCG/ACT nasal spray SPRAY 2 SPRAYS INTO EACH NOSTRIL EVERY DAY      gabapentin (NEURONTIN) 300 MG capsule Take 300 mg by mouth 2 (two) times daily.      loratadine (CLARITIN) 10 MG tablet  Take 10 mg by mouth daily.      metFORMIN (GLUCOPHAGE) 500 MG tablet Take 1,000 mg by mouth 2 (two) times daily.      nitroGLYCERIN (NITROSTAT) 0.4 MG SL tablet Place 0.4 mg under the tongue every 5 (five) minutes as needed for chest pain.       Assessment: Pharmacy consulted to dose heparin in this 77 year old male admitted with ACS/NSTEMI.  Pt was on Eliquis 5 mg PO BID PTA.  Pt unsure of exact time of last dose but "thinks it was last night (8/2 PM)".   CrCl = 79.6 ml/min   8/3 1300 aPTT=92 Therapeutic x 1 8/3 2052 aPTT=88 Therapeutic x 2  Goal of Therapy:  Heparin level 0.3-0.7 units/ml aPTT 66 - 102  seconds Monitor platelets by anticoagulation protocol: Yes   Plan:   aPTT is therapeutic x 2 Continue heparin infusion at 1250 units/hr Recheck aPTT and HL with AM labs Adjust based on aPTT until correlation with HL. Daily CBC while on heparin  Barrie Folk, PharmD 10/13/2022,9:39 PM

## 2022-10-13 NOTE — Assessment & Plan Note (Signed)
On heparin drip in the setting of NSTEMI Hold Eliquis Continue amiodarone Holding Coreg as patient reports significant bradycardia with use

## 2022-10-13 NOTE — Plan of Care (Addendum)
Patient complained of increased chest pain.  5 of 10 and increased pressure.  Nitroglycerin drip increased to 10 /3 hr. Hosp and Cardio notified and EKG ordered/completed.  Pain reassessment  - Increased Nitroglycerin drip removed pain.

## 2022-10-13 NOTE — Assessment & Plan Note (Signed)
Recurrent moderate chest pain over the past 4 days has progressively worsened in the setting of prior coronary disease status post CABG March 2021 Positive symptomatic improvement with nitroglycerin at home Troponin 728-->632 EKG grossly stable Started on heparin and nitro drip in the ER in the setting of persistent chest pain Per Dr. Erma Heritage note, Dr. Darrold Junker with cardiology will be evaluating the patient Anticipate possible cardiac catheterization Follow-up formal cardiology recommendations 2D echo ordered

## 2022-10-13 NOTE — Progress Notes (Signed)
ANTICOAGULATION CONSULT NOTE - Initial Consult  Pharmacy Consult for Heparin  Indication: ACS/NSTEMI   Allergies  Allergen Reactions   Morphine Swelling   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   Trazodone Other (See Comments)    Patient Measurements: Height: 6' (182.9 cm) Weight: 91.6 kg (202 lb) IBW/kg (Calculated) : 77.6 Heparin Dosing Weight: 91.6 kg   Vital Signs: Temp: 97.9 F (36.6 C) (08/03 0310) Temp Source: Oral (08/03 0310) BP: 109/74 (08/03 0310) Pulse Rate: 88 (08/03 0310)  Labs: Recent Labs    10/13/22 0313  HGB 15.0  HCT 45.0  PLT 406*  LABPROT 15.1  INR 1.2  CREATININE 0.84  TROPONINIHS 728*    Estimated Creatinine Clearance: 79.6 mL/min (by C-G formula based on SCr of 0.84 mg/dL).   Medical History: 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   Hypertension    Ischemic cardiomyopathy    MI, old    Peripheral neuropathy    PFO (patent foramen ovale) 05/2019   s/p repair   Sleep apnea    Spinal stenosis of lumbar region    TIA (transient ischemic attack)     Medications:  (Not in a hospital admission)   Assessment: Pharmacy consulted to dose heparin in this 77 year old male admitted with ACS/NSTEMI.  Pt was on Eliquis 5 mg PO BID PTA.  Pt unsure of exact time of last dose but "thinks it was last night (8/2 PM)".   CrCl = 79.6 ml/min   Goal of Therapy:  Heparin level 0.3-0.7 units/ml aPTT 66 - 102  seconds Monitor platelets by anticoagulation protocol: Yes   Plan:  Give 4000 units bolus x 1 Start heparin infusion at 1250 units/hr - Will use aPTT to guide dosing until  correlating with HL, check HL daily  - Will draw 1st aPTT 8 hrs after start of drip - Check HL on 8/4 with AM labs.   Devin [name] D 10/13/2022,4:39 AM

## 2022-10-13 NOTE — ED Provider Notes (Signed)
[facility] Provider Note    Event Date/Time   First MD Initiated Contact with Patient 10/13/22 [phone]     (approximate)   History   Chest Pain   HPI  Devin [name]. is a 77 y.o. male  with h/o CAD s/p CABG, here with chest pain and pressure. Pt reports that every night for the past 3-4 nights he has had acute onset of a dull, pressure like chest pain when lying down to sleep. He took ntiro each time with relief. He had pain during the day yesterday at around 2 PM, went to UC and left after the pain had resolved. He had recurrence of pain this evening, relieved with nitroglycerin again. Feels somewhat similar though not as severe as his heart attack. No recent med changes.       Physical Exam   Triage Vital Signs: ED Triage Vitals [10/13/22 0310]  Encounter Vitals Group     BP 109/74     Systolic BP Percentile      Diastolic BP Percentile      Pulse Rate 88     Resp 18     Temp 97.9 F (36.6 C)     Temp Source Oral     SpO2 97 %     Weight 202 lb (91.6 kg)     Height 6' (1.829 m)     Head Circumference      Peak Flow      Pain Score 1     Pain Loc      Pain Education      Exclude from Growth Chart     Most recent vital signs: Vitals:   10/13/22 0500 10/13/22 0508  BP: 118/65 99/85  Pulse: 74 74  Resp: 19 18  Temp:    SpO2: 98% 98%     General: Awake, no distress.  CV:  Good peripheral perfusion.  Resp:  Normal work of breathing. Lungs clear. Abd:  No distention. No tenderness. Other:  No LE edema.   ED Results / Procedures / Treatments   Labs (all labs ordered are listed, but only abnormal results are displayed) Labs Reviewed  BASIC METABOLIC PANEL - Abnormal; Notable for the following components:      Result Value   Sodium 134 (*)    Glucose, Bld 107 (*)    All other components within normal limits  CBC - Abnormal; Notable for the following components:   WBC 10.7 (*)    Platelets 406 (*)    All other  components within normal limits  HEPARIN LEVEL (UNFRACTIONATED) - Abnormal; Notable for the following components:   Heparin Unfractionated >1.10 (*)    All other components within normal limits  APTT - Abnormal; Notable for the following components:   aPTT 38 (*)    All other components within normal limits  TROPONIN I (HIGH SENSITIVITY) - Abnormal; Notable for the following components:   Troponin I (High Sensitivity) 728 (*)    All other components within normal limits  PROTIME-INR  APTT  TROPONIN I (HIGH SENSITIVITY)     EKG Normal sinus rhythm, VR 88. PR 178, QRS 82, QTc 462. No acute ST elevations or depressions. Subtle ST depressions in V2/V3, no elevations.   RADIOLOGY CXR: No active disease   I also independently reviewed and agree with radiologist interpretations.   PROCEDURES:  Critical Care performed: Yes, see critical care procedure note(s)  .Critical Care  Performed by: Shaune Pollack, MD Authorized by:  Shaune Pollack, MD   Critical care provider statement:    Critical care time (minutes):  30   Critical care time was exclusive of:  Separately billable procedures and treating other patients   Critical care was necessary to treat or prevent imminent or life-threatening deterioration of the following conditions:  Cardiac failure, circulatory failure and respiratory failure   Critical care was time spent personally by me on the following activities:  Development of treatment plan with patient or surrogate, discussions with consultants, evaluation of patient's response to treatment, examination of patient, ordering and review of laboratory studies, ordering and review of radiographic studies, ordering and performing treatments and interventions, pulse oximetry, re-evaluation of patient's condition and review of old charts     MEDICATIONS ORDERED IN ED: Medications  heparin ADULT infusion 100 units/mL (25000 units/221mL) (1,250 Units/hr Intravenous New Bag/Given  10/13/22 0500)  nitroGLYCERIN 50 mg in dextrose 5 % 250 mL (0.2 mg/mL) infusion (has no administration in time range)  aspirin chewable tablet 324 mg (324 mg Oral Given 10/13/22 0417)  nitroGLYCERIN (NITROGLYN) 2 % ointment 1 inch (1 inch Topical Given 10/13/22 0437)  sodium chloride 0.9 % bolus 500 mL (500 mLs Intravenous New Bag/Given 10/13/22 0437)  heparin bolus via infusion 4,000 Units (4,000 Units Intravenous Bolus from Bag 10/13/22 0500)  fentaNYL (SUBLIMAZE) injection 50 mcg (50 mcg Intravenous Given 10/13/22 0547)  ondansetron (ZOFRAN) injection 4 mg (4 mg Intravenous Given 10/13/22 0546)     IMPRESSION / MDM / ASSESSMENT AND PLAN / ED COURSE  I reviewed the triage vital signs and the nursing notes.                              Differential diagnosis includes, but is not limited to, ACS, unstable angina, CHF, PNA, arrhythmia, anemia  Patient's presentation is most consistent with acute presentation with potential threat to life or bodily function.  The patient is on the cardiac monitor to evaluate for evidence of arrhythmia and/or significant heart rate changes  77 yo M with h/o CAD s/p CABG, AAA, CHF, DM, HTN, here with substernal chest pain/pressure. Suspect NSTEMI with sx highly c/w unstable angina, now with CP at rest. EKG nonischemic but trop >700 on arrival. ASA given and heparin gtt started. Pain initially resolved with nitroglycerin paste but returned, will start drip. CXR reviewed and is clear. Dr. Darrold Junker with Cardiology consulted and will see pt this morning. Admit to stepdown with Hospitalist.   FINAL CLINICAL IMPRESSION(S) / ED DIAGNOSES   Final diagnoses:  NSTEMI (non-ST elevated myocardial infarction) (HCC)     Rx / DC Orders   ED Discharge Orders     None        Note:  This document was prepared using Dragon voice recognition software and may include unintentional dictation errors.   Shaune Pollack, MD 10/13/22 [phone]

## 2022-10-13 NOTE — Consult Note (Signed)
[facility] CARDIOLOGY CONSULT NOTE       Patient ID: Devin [name]. MRN: [id number] DOB/AGE: 1945/12/23 77 y.o.  Admit date: 10/13/2022 Referring Physician Dr. Shaune Pollack Primary Physician Dr. Rolin Barry Primary Cardiologist Dr. Adrian Blackwater Reason for Consultation NSTEMI  HPI: Devin [name]. is a 77 y.o. male  with a past medical history of coronary artery disease s/p PCI and DES to LAD, RCA, LCx with subsequent CABG x1 (LIMA to LAD), ischemic cardiomyopathy, chronic heart failure preserved ejection fraction (echo 09/2022 at [facility] with EF >55%), paroxysmal atrial fibrillation s/p cardioversion, AAA, hypertension, hyperlipidemia 12/2019 who presented to the ED on 10/13/2022 for chest pain.  Cardiology consulted for further assistance of NSTEMI.  Patient of Dr. Welton Flakes, who presented to [facility] ED on the morning of 10/13/2022 with chest pain that has been intermittent for the last 3 to 4 days.  States that the pain would occur at night when he was getting ready to go to sleep.  He had been taking sublingual nitroglycerin for these episodes which helped improve his pain.  He describes the pain as a "burning sensation". Workup in the ED notable for troponin elevation to 728 > 632, EKG appears nonischemic.  Patient was started on nitroglycerin infusion as well as heparin infusion in the ED. At the time of my evaluation the patient is resting comfortably in bed with no complaints.  He states that his chest pain is resolved since being on the nitroglycerin infusion.  He denies any shortness of breath, palpitations, leg swelling.  Of note, the patient was hospitalized last month at [facility] for symptomatic bradycardia.  At this time his carvedilol was discontinued and he is currently being worked up outpatient for possible need for pacemaker.  Review of systems complete and found to be negative unless listed above     Past Medical History:  Diagnosis Date   AAA (abdominal aortic aneurysm)  (HCC)    Anxiety    Aortic atherosclerosis (HCC)    Arthritis    Atrial fibrillation (HCC)    CAD (coronary artery disease)    CHF (congestive heart failure) (HCC)    Current use of long term anticoagulation    Apixaban   Depression    Diabetes mellitus without complication (HCC)    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   Hypertension    Ischemic cardiomyopathy    MI, old    Peripheral neuropathy    PFO (patent foramen ovale) 05/2019   s/p repair   Sleep apnea    Spinal stenosis of lumbar region    TIA (transient ischemic attack)     Past Surgical History:  Procedure Laterality Date   APPENDECTOMY     BREAST SURGERY Right    benign mass   CARDIOVERSION Right 12/15/2019   Procedure: CARDIOVERSION;  Surgeon: Laurier Nancy, [name];  Location: [facility] ORS;  Service: Cardiovascular;  Laterality: Right;   CATARACT EXTRACTION, BILATERAL     COLONOSCOPY     CORONARY ANGIOPLASTY WITH STENT PLACEMENT     CORONARY ARTERY BYPASS GRAFT  05/2019   LIMA-LAD   LEFT HEART CATH AND CORS/GRAFTS ANGIOGRAPHY N/A 05/21/2019   Procedure: LEFT HEART CATH AND CORONARY ANGIOGRAPHY;  Surgeon: Laurier Nancy, [name];  Location: [facility] INVASIVE CV LAB;  Service: Cardiovascular;  Laterality: N/A;   LUMBAR LAMINECTOMY/DECOMPRESSION MICRODISCECTOMY N/A 07/04/2020   Procedure: L4-5 LAMINECTOMY;  Surgeon: Lucy Chris, [name];  Location: [facility] ORS;  Service: Neurosurgery;  Laterality: N/A;   REPLACEMENT  TOTAL KNEE BILATERAL      Medications Prior to Admission  Medication Sig Dispense Refill Last Dose   albuterol (VENTOLIN HFA) 108 (90 Base) MCG/ACT inhaler Inhale 2 puffs into the lungs every 6 (six) hours as needed for wheezing or shortness of breath.      amiodarone (PACERONE) 200 MG tablet Take 200 mg by mouth daily.      atorvastatin (LIPITOR) 80 MG tablet Take 80 mg by mouth daily.      buPROPion (WELLBUTRIN XL) 300 MG 24 hr tablet Take 300 mg by mouth daily.      Continuous Blood Gluc Receiver (FREESTYLE LIBRE 14 DAY  READER) DEVI Use 1 Device as directed      ELIQUIS 5 MG TABS tablet TAKE 1 TABLET BY MOUTH TWICE A DAY 180 tablet 1    ENTRESTO 97-103 MG TAKE 1 TABLET BY MOUTH TWICE A DAY 180 tablet 1    EPINEPHrine 0.3 mg/0.3 mL IJ SOAJ injection Inject 0.3 mg into the muscle as needed for anaphylaxis.      ezetimibe (ZETIA) 10 MG tablet Take 10 mg by mouth daily.      fluticasone (FLONASE) 50 MCG/ACT nasal spray SPRAY 2 SPRAYS INTO EACH NOSTRIL EVERY DAY      gabapentin (NEURONTIN) 300 MG capsule Take 300 mg by mouth 2 (two) times daily.      glucose blood (PRECISION QID TEST) test strip Check fasting sugar once daily E11.9      Lancets (ONETOUCH DELICA PLUS LANCET33G) MISC Apply topically daily.      loratadine (CLARITIN) 10 MG tablet Take 10 mg by mouth daily.      metFORMIN (GLUCOPHAGE) 500 MG tablet Take 1,000 mg by mouth 2 (two) times daily.      nitroGLYCERIN (NITROSTAT) 0.4 MG SL tablet Place 0.4 mg under the tongue every 5 (five) minutes as needed for chest pain.      ONETOUCH ULTRA test strip CHECK FASTING SUGAR ONCE DAILY E11.9      Social History   Socioeconomic History   Marital status: Widowed    Spouse name: Not on file   Number of children: Not on file   Years of education: Not on file   Highest education level: Not on file  Occupational History   Not on file  Tobacco Use   Smoking status: Former   Smokeless tobacco: Never   Tobacco comments:    Quit over 40 years ago  Vaping Use   Vaping status: Never Used  Substance and Sexual Activity   Alcohol use: Not Currently    Comment: social   Drug use: Never   Sexual activity: Not on file  Other Topics Concern   Not on file  Social History Narrative   alone   Social Determinants of Health   Financial Resource Strain: Low Risk  (09/25/2022)   Received from [facility]   Overall Financial Resource Strain (CARDIA)    Difficulty of Paying Living Expenses: Not very hard  Food Insecurity: No Food Insecurity (10/13/2022)    Hunger Vital Sign    Worried About Running Out of Food in the Last Year: Never true    Ran Out of Food in the Last Year: Never true  Transportation Needs: No Transportation Needs (10/13/2022)   PRAPARE - Administrator, Civil Service (Medical): No    Lack of Transportation (Non-Medical): No  Physical Activity: Not on file  Stress: Not on file  Social Connections: Not on file  Intimate Partner Violence: Not At Risk (10/13/2022)   Humiliation, Afraid, Rape, and Kick questionnaire    Fear of Current or Ex-Partner: No    Emotionally Abused: No    Physically Abused: No    Sexually Abused: No    Family History  Problem Relation Age of Onset   Osteoarthritis Mother    Other Father 74       MVA     Vitals:   10/13/22 0715 10/13/22 0730 10/13/22 0810 10/13/22 1247  BP:  100/63 118/67 107/71  Pulse:  80 81 72  Resp:  15 18 19   Temp: 97.6 F (36.4 C)  (!) 97.5 F (36.4 C) 97.7 F (36.5 C)  TempSrc: Oral  Oral Oral  SpO2:  95% 98% 96%  Weight:      Height:        PHYSICAL EXAM General: Well-appearing elderly male, well nourished, in no acute distress resting comfortably in hospital bed. HEENT: Normocephalic and atraumatic. Neck: No JVD.  Lungs: Normal respiratory effort. Clear bilaterally to auscultation. No wheezes, crackles, rhonchi.  Heart: HRRR. Normal S1 and S2 without gallops or murmurs.  Abdomen: Non-distended appearing.  Msk: Normal strength and tone for age. Extremities: Warm and well perfused. No clubbing, cyanosis. No edema.  Neuro: Alert and oriented X 3. Psych: Answers questions appropriately.   Labs: Basic Metabolic Panel: Recent Labs    10/13/22 0313  NA 134*  K 4.7  CL 101  CO2 23  GLUCOSE 107*  BUN 14  CREATININE 0.84  CALCIUM 9.2   Liver Function Tests: No results for input(s): "AST", "ALT", "ALKPHOS", "BILITOT", "PROT", "ALBUMIN" in the last 72 hours. No results for input(s): "LIPASE", "AMYLASE" in the last 72 hours. CBC: Recent Labs     10/13/22 0313  WBC 10.7*  HGB 15.0  HCT 45.0  MCV 90.9  PLT 406*   Cardiac Enzymes: Recent Labs    10/13/22 0313 10/13/22 0518  TROPONINIHS 728* 632*   BNP: No results for input(s): "BNP" in the last 72 hours. D-Dimer: No results for input(s): "DDIMER" in the last 72 hours. Hemoglobin A1C: No results for input(s): "HGBA1C" in the last 72 hours. Fasting Lipid Panel: Recent Labs    10/13/22 0313  CHOL 105  HDL 59  LDLCALC 35  TRIG 55  CHOLHDL 1.8   Thyroid Function Tests: No results for input(s): "TSH", "T4TOTAL", "T3FREE", "THYROIDAB" in the last 72 hours.  Invalid input(s): "FREET3" Anemia Panel: No results for input(s): "VITAMINB12", "FOLATE", "FERRITIN", "TIBC", "IRON", "RETICCTPCT" in the last 72 hours.   Radiology: ECHOCARDIOGRAM COMPLETE  Result Date: 10/13/2022    ECHOCARDIOGRAM REPORT   Patient Name:   Devin Kidney [facility]. Date of Exam: 10/13/2022 Medical Rec #:  [id number]               Height:       72.0 in Accession #:    [id number]              Weight:       202.0 lb Date of Birth:  09/08/1945               BSA:          2.140 m Patient Age:    76 years                BP:           118/67 mmHg Patient Gender: M  HR:           70 bpm. Exam Location:  [facility] Procedure: 2D Echo Indications:     NSTEMI I21.4  History:         Patient has prior history of Echocardiogram examinations, most                  recent 12/14/2019.  Sonographer:     Overton Mam RDCS, FASE Referring Phys:  [id number] Francoise Schaumann [place] Diagnosing Phys: Devin [name]  Sonographer Comments: No subcostal window. Image acquisition challenging due to patient body habitus. IMPRESSIONS  1. Left ventricular ejection fraction, by estimation, is 50 to 55%. The left ventricle has low normal function. The left ventricle has no regional wall motion abnormalities. Left ventricular diastolic parameters were normal.  2. Right ventricular systolic function is normal. The right  ventricular size is normal.  3. The mitral valve is normal in structure. Mild to moderate mitral valve regurgitation. No evidence of mitral stenosis.  4. The aortic valve is normal in structure. Aortic valve regurgitation is not visualized. No aortic stenosis is present.  5. The inferior vena cava is normal in size with greater than 50% respiratory variability, suggesting right atrial pressure of 3 mmHg. FINDINGS  Left Ventricle: Left ventricular ejection fraction, by estimation, is 50 to 55%. The left ventricle has low normal function. The left ventricle has no regional wall motion abnormalities. The left ventricular internal cavity size was normal in size. There is no left ventricular hypertrophy. Left ventricular diastolic parameters were normal. Right Ventricle: The right ventricular size is normal. No increase in right ventricular wall thickness. Right ventricular systolic function is normal. Left Atrium: Left atrial size was normal in size. Right Atrium: Right atrial size was normal in size. Pericardium: There is no evidence of pericardial effusion. Mitral Valve: The mitral valve is normal in structure. Mild to moderate mitral valve regurgitation. No evidence of mitral valve stenosis. Tricuspid Valve: The tricuspid valve is normal in structure. Tricuspid valve regurgitation is mild . No evidence of tricuspid stenosis. Aortic Valve: The aortic valve is normal in structure. Aortic valve regurgitation is not visualized. No aortic stenosis is present. Aortic valve peak gradient measures 6.6 mmHg. Pulmonic Valve: The pulmonic valve was normal in structure. Pulmonic valve regurgitation is not visualized. No evidence of pulmonic stenosis. Aorta: The aortic root is normal in size and structure. Venous: The inferior vena cava is normal in size with greater than 50% respiratory variability, suggesting right atrial pressure of 3 mmHg. IAS/Shunts: No atrial level shunt detected by color flow Doppler.  LEFT VENTRICLE PLAX  2D LVIDd:         5.20 cm      Diastology LVIDs:         3.80 cm      LV e' medial:    8.59 cm/s LV PW:         1.30 cm      LV E/e' medial:  8.4 LV IVS:        1.00 cm      LV e' lateral:   14.30 cm/s LVOT diam:     2.20 cm      LV E/e' lateral: 5.0 LV SV:         53 LV SV Index:   25 LVOT Area:     3.80 cm  LV Volumes (MOD) LV vol d, MOD A4C: 155.0 ml LV vol s, MOD A4C: 64.8 ml LV SV MOD A4C:  155.0 ml RIGHT VENTRICLE RV Basal diam:  3.40 cm RV S prime:     9.90 cm/s TAPSE (M-mode): 1.6 cm LEFT ATRIUM             Index        RIGHT ATRIUM           Index LA diam:        4.80 cm 2.24 cm/m   RA Area:     14.30 cm LA Vol (A2C):   81.8 ml 38.23 ml/m  RA Volume:   34.50 ml  16.12 ml/m LA Vol (A4C):   53.6 ml 25.05 ml/m LA Biplane Vol: 68.8 ml 32.16 ml/m  AORTIC VALVE                 PULMONIC VALVE AV Area (Vmax): 1.83 cm     PV Vmax:          0.90 m/s AV Vmax:        128.00 cm/s  PV Peak grad:     3.2 mmHg AV Peak Grad:   6.6 mmHg     PR End Diast Vel: 5.76 msec LVOT Vmax:      61.50 cm/s   RVOT Peak grad:   3 mmHg LVOT Vmean:     39.900 cm/s LVOT VTI:       0.140 m  AORTA Ao Root diam: 3.80 cm Ao Asc diam:  3.40 cm MITRAL VALVE               TRICUSPID VALVE MV Area (PHT): 2.82 cm    TV Peak grad:   22.5 mmHg MV Decel Time: 269 msec    TV Vmax:        2.37 m/s MV E velocity: 71.80 cm/s MV A velocity: 70.70 cm/s  SHUNTS MV E/A ratio:  1.02        Systemic VTI:  0.14 m                            Systemic Diam: 2.20 cm Devin [name] Electronically signed by Devin [name] Signature Date/Time: 10/13/2022/11:32:52 AM    Final    DG Chest 2 View  Result Date: 10/13/2022 CLINICAL DATA:  Chest pain. EXAM: CHEST - 2 VIEW COMPARISON:  04/07/2020 FINDINGS: Lungs are clear. No pneumothorax or pleural effusion. Coronary artery bypass grafting has been performed. Cardiac size within normal limits. Pulmonary vascularity is normal. Neurostimulator battery pack overlies the right hemithorax with single  lead extending to the right neck base, unchanged. Stable retained lead within the right chest wall. Osseous structures are age-appropriate. No acute bone abnormality. IMPRESSION: 1. No active cardiopulmonary disease. Electronically Signed   By: Helyn Numbers M.D.   On: 10/13/2022 04:14   VAS [place] AAA DUPLEX  Result Date: 10/08/2022 ABDOMINAL AORTA STUDY Patient Name:  Devin [facility]feldt [facility].  Date of Exam:   10/08/2022 Medical Rec #: [id number]                Accession #:    [id number] Date of Birth: 12-08-1945                Patient Gender: M Patient Age:   32 years Exam Location:  [facility] Procedure:      VAS [place] AAA DUPLEX Referring Phys: Levora Dredge --------------------------------------------------------------------------------  Indications: Follow up exam for known AAA. Limitations: Air/bowel gas.  Comparison Study: 08/2021 Performing Technologist: Salvadore Farber RVT  Examination Guidelines: A complete evaluation includes B-mode imaging, spectral Doppler, color Doppler, and power Doppler as needed of all accessible portions of each vessel. Bilateral testing is considered an integral part of a complete examination. Limited examinations for reoccurring indications may be performed as noted.  Abdominal Aorta Findings: +-----------+-------+----------+----------+--------+--------+--------+ Location   AP (cm)Trans (cm)PSV (cm/s)WaveformThrombusComments +-----------+-------+----------+----------+--------+--------+--------+ Proximal   2.02   2.19      49                                 +-----------+-------+----------+----------+--------+--------+--------+ Mid        2.92   2.94      48                                 +-----------+-------+----------+----------+--------+--------+--------+ Distal     1.68   1.71      56                                 +-----------+-------+----------+----------+--------+--------+--------+ RT CIA Prox1.3    1.3       64                                  +-----------+-------+----------+----------+--------+--------+--------+ LT CIA Prox1.4    1.3       51                                 +-----------+-------+----------+----------+--------+--------+--------+  Summary: Abdominal Aorta: There is evidence of abnormal dilatation of the mid Abdominal aorta. The largest aortic measurement is 2.9 cm. Mild dilitation of the mid abdominal aorta compared to proximal and distal dimensions. Mild dilitation of the proximal left CIA. No chamge from previous study. The largest aortic diameter remains essentially unchanged compared to prior exam. Previous diameter measurement was 2.7 cm obtained on 08/2021.  *See table(s) above for measurements and observations.  Electronically signed by Levora Dredge [name] on 10/08/2022 at 3:10:03 PM.    Final     ECHO 10/13/2022:  1. Left ventricular ejection fraction, by estimation, is 50 to 55%. The  left ventricle has low normal function. The left ventricle has no regional  wall motion abnormalities. Left ventricular diastolic parameters were  normal.   2. Right ventricular systolic function is normal. The right ventricular  size is normal.   3. The mitral valve is normal in structure. Mild to moderate mitral valve  regurgitation. No evidence of mitral stenosis.   4. The aortic valve is normal in structure. Aortic valve regurgitation is  not visualized. No aortic stenosis is present.   5. The inferior vena cava is normal in size with greater than 50%  respiratory variability, suggesting right atrial pressure of 3 mmHg.   TELEMETRY reviewed by me [facility]) 10/13/2022 : Sinus rhythm rate 70s  EKG reviewed by me: NSR rate 85 bpm, appears nonischemic  Data reviewed by me [facility]) 10/13/2022: last 24h vitals tele labs imaging I/O ED provider note, admission H&P  Principal Problem:   NSTEMI (non-ST elevated myocardial infarction) [facility]) Active Problems:   Controlled type 2 diabetes mellitus without complication, without  long-term current use of insulin (HCC)   GERD (gastroesophageal reflux disease)   AAA (abdominal  aortic aneurysm) (HCC)   Atrial fibrillation status post cardioversion (HCC)   Hyperlipidemia   HFrEF (heart failure with reduced ejection fraction) (HCC)    ASSESSMENT AND PLAN:  Devin Turman. is a 77 y.o. male  with a past medical history of coronary artery disease s/p PCI and DES to LAD, RCA, LCx with subsequent CABG x1 (LIMA to LAD), ischemic cardiomyopathy, chronic heart failure preserved ejection fraction (echo 09/2022 at [facility] with EF >55%), paroxysmal atrial fibrillation s/p cardioversion, AAA, hypertension, hyperlipidemia 12/2019 who presented to the ED on 10/13/2022 for chest pain.  Cardiology consulted for further assistance of NSTEMI.  # NSTEMI # CAD s/p CABG x 1 2021  Patient with history of coronary disease s/p prior stenting to LAD, RCA, LCx and also underwent CABG x 1 in 2021 (LIMA to LAD).  Now presenting with burning chest pain for the last 4 days which has been proved with nitroglycerin.  Troponins in the ED 728 > 632, EKG nonacute. -Plan for [facility] Monday with Dr. Darrold Junker. -Continue heparin infusion. -Continue nitroglycerin infusion for management of anginal symptoms. -Continue atorvastatin 80 mg daily and Zetia 10 mg daily.  Most recent LDL 38 from 09/2022  #Chronic systolic heart failure Patient with history of HFrEF (EF 40-45% 2021) on GDMT with improvement of heart function on echo done at [facility] 09/2022 (EF > 55%, normal diastolic function, no reported wall motion abnormalities.  Patient appears euvolemic on exam. -Echo completed today shows EF 50-55%, mild to moderate mitral regurgitation.  -Home Entresto held currently for hypotension while on nitroglycerin infusion.   # Paroxysmal atrial fibrillation s/p cardioversion 12/2019 Patient with history of 1 episode of atrial fibrillation which was cardioverted and he denies any recurrence since this time. -Continue  home amiodarone.  Carvedilol discontinued from patient's medication list as it was discontinued on discharge last month from [facility]. -Defer any AV nodal blockers due to patient's recent hospitalization for bradycardia.   This patient's plan of care was discussed and created with Dr. Darrold Junker and he is in agreement.  Signed: Cheryl Flash, PA-C 10/13/2022, 1:00 PM [facility]

## 2022-10-13 NOTE — Assessment & Plan Note (Signed)
Lipid panel Continue statin

## 2022-10-14 DIAGNOSIS — I214 Non-ST elevation (NSTEMI) myocardial infarction: Secondary | ICD-10-CM

## 2022-10-14 LAB — GLUCOSE, CAPILLARY
Glucose-Capillary: 124 mg/dL — ABNORMAL HIGH (ref 70–99)
Glucose-Capillary: 101 mg/dL — ABNORMAL HIGH (ref 70–99)
Glucose-Capillary: 161 mg/dL — ABNORMAL HIGH (ref 70–99)

## 2022-10-14 LAB — CBC
HCT: 38.8 % — ABNORMAL LOW (ref 39.0–52.0)
Hemoglobin: 12.7 g/dL — ABNORMAL LOW (ref 13.0–17.0)
MCH: 29.5 pg (ref 26.0–34.0)
MCHC: 32.7 g/dL (ref 30.0–36.0)
MCV: 90.2 fL (ref 80.0–100.0)
Platelets: 350 10*3/uL (ref 150–400)
RBC: 4.3 MIL/uL (ref 4.22–5.81)
RDW: 14.3 % (ref 11.5–15.5)
WBC: 10.1 10*3/uL (ref 4.0–10.5)
nRBC: 0 % (ref 0.0–0.2)

## 2022-10-14 LAB — APTT: aPTT: 80 s — ABNORMAL HIGH (ref 24–36)

## 2022-10-14 LAB — HEPARIN LEVEL (UNFRACTIONATED): Heparin Unfractionated: 0.86 [IU]/mL — ABNORMAL HIGH (ref 0.30–0.70)

## 2022-10-14 MED ORDER — IBUPROFEN 400 MG PO TABS
400.0000 mg | ORAL_TABLET | Freq: Four times a day (QID) | ORAL | Status: DC | PRN
  Administered 2022-10-15: 400 mg via ORAL
  Filled 2022-10-14 (×2): qty 1

## 2022-10-14 MED ORDER — SODIUM CHLORIDE 0.9% FLUSH
3.0000 mL | Freq: Two times a day (BID) | INTRAVENOUS | Status: DC
  Administered 2022-10-14 – 2022-10-15 (×2): 3 mL via INTRAVENOUS

## 2022-10-14 MED ORDER — LIDOCAINE 5 % EX PTCH
2.0000 | MEDICATED_PATCH | CUTANEOUS | Status: DC
  Administered 2022-10-14 – 2022-10-15 (×2): 2 via TRANSDERMAL
  Filled 2022-10-14 (×2): qty 2

## 2022-10-14 NOTE — Progress Notes (Signed)
[facility] ([place]) Cardiology  SUBJECTIVE: Patient laying in bed, reports feeling better, denies chest pain   Vitals:   10/13/22 2016 10/14/22 0034 10/14/22 0350 10/14/22 0803  BP: (!) 105/95 108/69 (!) 99/58 111/70  Pulse:  93  (!) 59  Resp:  20 20 19   Temp: 97.7 F (36.5 C) (!) 97 F (36.1 C) 97.8 F (36.6 C) 97.8 F (36.6 C)  TempSrc: Oral Oral Oral Oral  SpO2: 94% 96% 95% 93%  Weight:      Height:         Intake/Output Summary (Last 24 hours) at 10/14/2022 0815 Last data filed at 10/14/2022 0800 Gross per 24 hour  Intake 410.92 ml  Output 1350 ml  Net -939.08 ml      PHYSICAL EXAM  General: Well developed, well nourished, in no acute distress HEENT:  Normocephalic and atramatic Neck:  No JVD.  Lungs: Clear bilaterally to auscultation and percussion. Heart: HRRR . Normal S1 and S2 without gallops or murmurs.  Abdomen: Bowel sounds are positive, abdomen soft and non-tender  Msk:  Back normal, normal gait. Normal strength and tone for age. Extremities: No clubbing, cyanosis or edema.   Neuro: Alert and oriented X 3. Psych:  Good affect, responds appropriately   LABS: Basic Metabolic Panel: Recent Labs    10/13/22 0313  NA 134*  K 4.7  CL 101  CO2 23  GLUCOSE 107*  BUN 14  CREATININE 0.84  CALCIUM 9.2   Liver Function Tests: No results for input(s): "AST", "ALT", "ALKPHOS", "BILITOT", "PROT", "ALBUMIN" in the last 72 hours. No results for input(s): "LIPASE", "AMYLASE" in the last 72 hours. CBC: Recent Labs    10/13/22 0313 10/14/22 0447  WBC 10.7* 10.1  HGB 15.0 12.7*  HCT 45.0 38.8*  MCV 90.9 90.2  PLT 406* 350   Cardiac Enzymes: No results for input(s): "CKTOTAL", "CKMB", "CKMBINDEX", "TROPONINI" in the last 72 hours. BNP: Invalid input(s): "POCBNP" D-Dimer: No results for input(s): "DDIMER" in the last 72 hours. Hemoglobin A1C: Recent Labs    10/13/22 0313  HGBA1C 6.3*   Fasting Lipid Panel: Recent Labs    10/13/22 0313  CHOL 105  HDL 59   LDLCALC 35  TRIG 55  CHOLHDL 1.8   Thyroid Function Tests: No results for input(s): "TSH", "T4TOTAL", "T3FREE", "THYROIDAB" in the last 72 hours.  Invalid input(s): "FREET3" Anemia Panel: No results for input(s): "VITAMINB12", "FOLATE", "FERRITIN", "TIBC", "IRON", "RETICCTPCT" in the last 72 hours.  ECHOCARDIOGRAM COMPLETE  Result Date: 10/13/2022    ECHOCARDIOGRAM REPORT   Patient Name:   Devin [name] [facility] Date of Exam: 10/13/2022 Medical Rec #:  [id number]               Height:       72.0 in Accession #:    [id number]              Weight:       202.0 lb Date of Birth:  [birth year]               BSA:          2.140 m Patient Age:    77 years                BP:           118/67 mmHg Patient Gender: M                       HR:  70 bpm. Exam Location:  [facility] Procedure: 2D Echo Indications:     NSTEMI I21.4  History:         Patient has prior history of Echocardiogram examinations, most                  recent 12/14/2019.  Sonographer:     Overton Mam RDCS, FASE Referring Phys:  [id number] Francoise Schaumann [place] Diagnosing Phys: Marcina Millard MD  Sonographer Comments: No subcostal window. Image acquisition challenging due to patient body habitus. IMPRESSIONS  1. Left ventricular ejection fraction, by estimation, is 50 to 55%. The left ventricle has low normal function. The left ventricle has no regional wall motion abnormalities. Left ventricular diastolic parameters were normal.  2. Right ventricular systolic function is normal. The right ventricular size is normal.  3. The mitral valve is normal in structure. Mild to moderate mitral valve regurgitation. No evidence of mitral stenosis.  4. The aortic valve is normal in structure. Aortic valve regurgitation is not visualized. No aortic stenosis is present.  5. The inferior vena cava is normal in size with greater than 50% respiratory variability, suggesting right atrial pressure of 3 mmHg. FINDINGS  Left Ventricle: Left ventricular ejection  fraction, by estimation, is 50 to 55%. The left ventricle has low normal function. The left ventricle has no regional wall motion abnormalities. The left ventricular internal cavity size was normal in size. There is no left ventricular hypertrophy. Left ventricular diastolic parameters were normal. Right Ventricle: The right ventricular size is normal. No increase in right ventricular wall thickness. Right ventricular systolic function is normal. Left Atrium: Left atrial size was normal in size. Right Atrium: Right atrial size was normal in size. Pericardium: There is no evidence of pericardial effusion. Mitral Valve: The mitral valve is normal in structure. Mild to moderate mitral valve regurgitation. No evidence of mitral valve stenosis. Tricuspid Valve: The tricuspid valve is normal in structure. Tricuspid valve regurgitation is mild . No evidence of tricuspid stenosis. Aortic Valve: The aortic valve is normal in structure. Aortic valve regurgitation is not visualized. No aortic stenosis is present. Aortic valve peak gradient measures 6.6 mmHg. Pulmonic Valve: The pulmonic valve was normal in structure. Pulmonic valve regurgitation is not visualized. No evidence of pulmonic stenosis. Aorta: The aortic root is normal in size and structure. Venous: The inferior vena cava is normal in size with greater than 50% respiratory variability, suggesting right atrial pressure of 3 mmHg. IAS/Shunts: No atrial level shunt detected by color flow Doppler.  LEFT VENTRICLE PLAX 2D LVIDd:         5.20 cm      Diastology LVIDs:         3.80 cm      LV e' medial:    8.59 cm/s LV PW:         1.30 cm      LV E/e' medial:  8.4 LV IVS:        1.00 cm      LV e' lateral:   14.30 cm/s LVOT diam:     2.20 cm      LV E/e' lateral: 5.0 LV SV:         53 LV SV Index:   25 LVOT Area:     3.80 cm  LV Volumes (MOD) LV vol d, MOD A4C: 155.0 ml LV vol s, MOD A4C: 64.8 ml LV SV MOD A4C:     155.0 ml RIGHT VENTRICLE RV Basal diam:  3.40 cm  RV S  prime:     9.90 cm/s TAPSE (M-mode): 1.6 cm LEFT ATRIUM             Index        RIGHT ATRIUM           Index LA diam:        4.80 cm 2.24 cm/m   RA Area:     14.30 cm LA Vol (A2C):   81.8 ml 38.23 ml/m  RA Volume:   34.50 ml  16.12 ml/m LA Vol (A4C):   53.6 ml 25.05 ml/m LA Biplane Vol: 68.8 ml 32.16 ml/m  AORTIC VALVE                 PULMONIC VALVE AV Area (Vmax): 1.83 cm     PV Vmax:          0.90 m/s AV Vmax:        128.00 cm/s  PV Peak grad:     3.2 mmHg AV Peak Grad:   6.6 mmHg     PR End Diast Vel: 5.76 msec LVOT Vmax:      61.50 cm/s   RVOT Peak grad:   3 mmHg LVOT Vmean:     39.900 cm/s LVOT VTI:       0.140 m  AORTA Ao Root diam: 3.80 cm Ao Asc diam:  3.40 cm MITRAL VALVE               TRICUSPID VALVE MV Area (PHT): 2.82 cm    TV Peak grad:   22.5 mmHg MV Decel Time: 269 msec    TV Vmax:        2.37 m/s MV E velocity: 71.80 cm/s MV A velocity: 70.70 cm/s  SHUNTS MV E/A ratio:  1.02        Systemic VTI:  0.14 m                            Systemic Diam: 2.20 cm Marcina Millard MD Electronically signed by Marcina Millard MD Signature Date/Time: 10/13/2022/11:32:52 AM    Final    DG Chest 2 View  Result Date: 10/13/2022 CLINICAL DATA:  Chest pain. EXAM: CHEST - 2 VIEW COMPARISON:  04/07/2020 FINDINGS: Lungs are clear. No pneumothorax or pleural effusion. Coronary artery bypass grafting has been performed. Cardiac size within normal limits. Pulmonary vascularity is normal. Neurostimulator battery pack overlies the right hemithorax with single lead extending to the right neck base, unchanged. Stable retained lead within the right chest wall. Osseous structures are age-appropriate. No acute bone abnormality. IMPRESSION: 1. No active cardiopulmonary disease. Electronically Signed   By: Helyn Numbers M.D.   On: 10/13/2022 04:14     Echo pending  TELEMETRY: Sinus rhythm:  ASSESSMENT AND PLAN:  Principal Problem:   NSTEMI (non-ST elevated myocardial infarction) (HCC) Active Problems:    Controlled type 2 diabetes mellitus without complication, without long-term current use of insulin (HCC)   GERD (gastroesophageal reflux disease)   AAA (abdominal aortic aneurysm) (HCC)   Atrial fibrillation status post cardioversion (HCC)   Hyperlipidemia   HFrEF (heart failure with reduced ejection fraction) (HCC)    1.  NSTEMI, high-sensitivity troponin 634, 751, currently chest pain-free on heparin drip, and low-dose glycerin drip 2.  CAD, CABG x 1 with LIMA-LAD and PFO repair, 05/25/2019 at [facility], with remote history of anterior STEMI treated with tPA without intervention 1994, coronary stent left circumflex 1998 3.  Paroxysmal  atrial fibrillation, status post cardioversion 03/2019, on Eliquis for stroke prevention 4.  Chronic HFmrEF, with improved LV function, 2D echocardiogram 10/13/2022 revealed LVEF 50-55%  Recommendations  1.  Agree with current therapy 2.  Continue heparin drip 3.  Continue nitroglycerin drip 4.  Continue amiodarone for rhythm control 5.  Continue atorvastatin and Zetia for hyperlipidemia management 6.  Cardiac catheterization with selective coronary arteriography in the a.m.  The risk, benefits alternatives of cardiac catheterization and possible PCI were explained to the patient and informed written consent was obtained.   Marcina Millard, MD, PhD, [facility] 10/14/2022 8:15 AM

## 2022-10-14 NOTE — Progress Notes (Signed)
  PROGRESS NOTE    Devin [name].  QIH:[id number] DOB: 1945/08/11 DOA: 10/13/2022 PCP: Dione Housekeeper, MD  252A/[id number]-AA  LOS: 1 day   Brief hospital course:   Assessment & Plan: Devin Voorhies. is a 77 y.o. male with medical history significant of multiple medical issues including coronary artery disease status post CABG March 2021, atrial fibrillation status post cardioversion on anticoagulation, AAA, HFrEF, type 2 diabetes, hypertension, hyperlipidemia presenting with NSTEMI.  Patient reports recurring moderate chest pain over the past 4 days.  Chest pain is been intermittent and recurring.  Intensity has mildly progressed over this timeframe.  No reported nausea or diaphoresis.  Intensity became more severe over the past 24 hours-similar to prior episode of chest pain that led to CABG.  Took home nitroglycerin with some improvement in symptoms.    * NSTEMI (non-ST elevated myocardial infarction) (HCC) Troponin 728-->632 EKG grossly stable Started on heparin and nitro drip in the ER in the setting of persistent chest pain, with improvement of symptom. Plan: --cardiac cath scheduled for Monday --cont heparin gtt and nitrog gtt --cont lipitor and Zetia  Paroxysmal Atrial fibrillation status post cardioversion [facility]) On heparin drip in the setting of NSTEMI Plan: Hold Eliquis while on heparin gtt Continue amiodarone --cont Lopressor  HFrEF (heart failure with reduced ejection fraction) (HCC) 2D echo March 2021 with a EF of 40 to 45% grade 1 diastolic dysfunction Appears euvolemic at present --current Echo showed LVEF improved to 50-55%, normal diastolic function.  Hyperlipidemia --cont statin  AAA (abdominal aortic aneurysm) (HCC) Followed by Dr. Gilda Crease in the outpatient setting-asymptomatic 4 cm AAA as of July 2024 office visit Monitor  Controlled type 2 diabetes mellitus without complication, without long-term current use of insulin (HCC) --d/c BG  checks, no need.   DVT prophylaxis: OV:FIEPPIR gtt Code Status: Full code  Family Communication:  Level of care: Progressive Dispo:   The patient is from: home Anticipated d/c is to: home Anticipated d/c date is: 1-2 days   Subjective and Interval History:  Pt reported no chest pain, no dyspnea.   Objective: Vitals:   10/14/22 0934 10/14/22 1125 10/14/22 1447 10/14/22 1559  BP:  109/64 129/79 111/68  Pulse: 63 (!) 59  63  Resp:  18  16  Temp:  97.7 F (36.5 C)  98.8 F (37.1 C)  TempSrc:  Oral  Oral  SpO2:  96%  93%  Weight:      Height:        Intake/Output Summary (Last 24 hours) at 10/14/2022 1730 Last data filed at 10/14/2022 1637 Gross per 24 hour  Intake 413.92 ml  Output 1850 ml  Net -1436.08 ml   Filed Weights   10/13/22 0310  Weight: 91.6 kg    Examination:   Constitutional: NAD, AAOx3 HEENT: conjunctivae and lids normal, EOMI CV: No cyanosis.   RESP: normal respiratory effort, on RA Neuro: II - XII grossly intact.   Psych: Normal mood and affect.  Appropriate judgement and reason   Data Reviewed: I have personally reviewed labs and imaging studies  Time spent: 35 minutes  Devin Priestly, MD [facility] If 7PM-7AM, please contact night-coverage 10/14/2022, 5:30 PM

## 2022-10-14 NOTE — Plan of Care (Signed)
  Problem: Education: Goal: Knowledge of General Education information will improve Description: Including pain rating scale, medication(s)/side effects and non-pharmacologic comfort measures Outcome: Progressing   Problem: Clinical Measurements: Goal: Ability to maintain clinical measurements within normal limits will improve Outcome: Progressing Goal: Diagnostic test results will improve Outcome: Progressing Goal: Cardiovascular complication will be avoided Outcome: Progressing   Problem: Activity: Goal: Risk for activity intolerance will decrease Outcome: Progressing   Problem: Pain Managment: Goal: General experience of comfort will improve Outcome: Progressing

## 2022-10-14 NOTE — Progress Notes (Signed)
ANTICOAGULATION CONSULT NOTE -   Pharmacy Consult for Heparin  Indication: ACS/NSTEMI   Allergies  Allergen Reactions   Morphine Swelling   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   Trazodone Other (See Comments)    Patient Measurements: Height: 6' (182.9 cm) Weight: 91.6 kg (202 lb) IBW/kg (Calculated) : 77.6 Heparin Dosing Weight: 91.6 kg   Vital Signs: Temp: 97.8 F (36.6 C) (08/04 0350) Temp Source: Oral (08/04 0350) BP: 99/58 (08/04 0350) Pulse Rate: 93 (08/04 0034)  Labs: Recent Labs    10/13/22 0313 10/13/22 0518 10/13/22 1300 10/13/22 1946 10/13/22 2052 10/14/22 0447  HGB 15.0  --   --   --   --  12.7*  HCT 45.0  --   --   --   --  38.8*  PLT 406*  --   --   --   --  350  APTT 38*  --  92*  --  88* 80*  LABPROT 15.1  --   --   --   --   --   INR 1.2  --   --   --   --   --   HEPARINUNFRC >1.10*  --   --   --   --  0.86*  CREATININE 0.84  --   --   --   --   --   TROPONINIHS 728* 632*  --  634* 751*  --     Estimated Creatinine Clearance: 79.6 mL/min (by C-G formula based on SCr of 0.84 mg/dL).   Medical History: 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   Hypertension    Ischemic cardiomyopathy    MI, old    Peripheral neuropathy    PFO (patent foramen ovale) 05/2019   s/p repair   Sleep apnea    Spinal stenosis of lumbar region    TIA (transient ischemic attack)     Medications:  Medications Prior to Admission  Medication Sig Dispense Refill Last Dose   albuterol (VENTOLIN HFA) 108 (90 Base) MCG/ACT inhaler Inhale 2 puffs into the lungs every 6 (six) hours as needed for wheezing or  shortness of breath.      amiodarone (PACERONE) 200 MG tablet Take 200 mg by mouth daily.      atorvastatin (LIPITOR) 80 MG tablet Take 80 mg by mouth daily.      buPROPion (WELLBUTRIN XL) 300 MG 24 hr tablet Take 300 mg by mouth daily.      ELIQUIS 5 MG TABS tablet TAKE 1 TABLET BY MOUTH TWICE A DAY 180 tablet 1    ENTRESTO 97-103 MG TAKE 1 TABLET BY MOUTH TWICE A DAY 180 tablet 1    EPINEPHrine 0.3 mg/0.3 mL IJ SOAJ injection Inject 0.3 mg into the muscle as needed for anaphylaxis.      ezetimibe (ZETIA) 10 MG tablet Take 10 mg by mouth daily.      fluticasone (FLONASE) 50 MCG/ACT nasal spray SPRAY 2 SPRAYS INTO EACH NOSTRIL EVERY DAY      gabapentin (NEURONTIN) 300 MG capsule Take 300  mg by mouth 2 (two) times daily.      loratadine (CLARITIN) 10 MG tablet Take 10 mg by mouth daily.      metFORMIN (GLUCOPHAGE) 500 MG tablet Take 1,000 mg by mouth 2 (two) times daily.      nitroGLYCERIN (NITROSTAT) 0.4 MG SL tablet Place 0.4 mg under the tongue every 5 (five) minutes as needed for chest pain.       Assessment: Pharmacy consulted to dose heparin in this 77 year old male admitted with ACS/NSTEMI.  Pt was on Eliquis 5 mg PO BID PTA.  Pt unsure of exact time of last dose but "thinks it was last night (8/2 PM)".   CrCl = 79.6 ml/min   8/3 1300 aPTT=92 Therapeutic x 1 8/3 2052 aPTT=88 Therapeutic x 2 8/4 0447 aPTT=80,     HL = 0.86  Goal of Therapy:  Heparin level 0.3-0.7 units/ml aPTT 66 - 102  seconds Monitor platelets by anticoagulation protocol: Yes   Plan:  8/4 @ 0447:  HL = 0.86,  aPTT = 80 - aPTT therapeutic X 3,  HL still elevated from Eliquis PTA - Will continue pt on current rate and recheck HL and aPTT on 8/5 with AM labs.  - Adjust based on aPTT until correlation with HL - Daily CBC while on heparin  Krishay Faro D, PharmD 10/14/2022,6:20 AM

## 2022-10-15 ENCOUNTER — Encounter
Admission: EM | Disposition: A | Discharge status: Home / Self Care | Payer: Self-pay | Source: Home / Self Care | Attending: Hospitalist

## 2022-10-15 ENCOUNTER — Other Ambulatory Visit: Payer: Self-pay

## 2022-10-15 DIAGNOSIS — I2510 Atherosclerotic heart disease of native coronary artery without angina pectoris: Secondary | ICD-10-CM | POA: Diagnosis not present

## 2022-10-15 DIAGNOSIS — R079 Chest pain, unspecified: Secondary | ICD-10-CM | POA: Diagnosis not present

## 2022-10-15 DIAGNOSIS — I214 Non-ST elevation (NSTEMI) myocardial infarction: Secondary | ICD-10-CM | POA: Diagnosis not present

## 2022-10-15 HISTORY — PX: LEFT HEART CATH: CATH118248

## 2022-10-15 HISTORY — PX: CORONARY STENT INTERVENTION: CATH118234

## 2022-10-15 LAB — GLUCOSE, CAPILLARY: Glucose-Capillary: 94 mg/dL (ref 70–99)

## 2022-10-15 LAB — POCT ACTIVATED CLOTTING TIME: Activated Clotting Time: 372 seconds

## 2022-10-15 SURGERY — LEFT HEART CATH
Anesthesia: Moderate Sedation

## 2022-10-15 MED ORDER — CLOPIDOGREL BISULFATE 300 MG PO TABS
ORAL_TABLET | ORAL | Status: AC
  Filled 2022-10-15: qty 2

## 2022-10-15 MED ORDER — SODIUM CHLORIDE 0.9 % IV SOLN
0.2500 mg/kg/h | INTRAVENOUS | Status: AC
  Administered 2022-10-15: 0.25 mg/kg/h via INTRAVENOUS
  Filled 2022-10-15: qty 250

## 2022-10-15 MED ORDER — IOHEXOL 300 MG/ML  SOLN
INTRAMUSCULAR | Status: DC | PRN
  Administered 2022-10-15: 196 mL

## 2022-10-15 MED ORDER — ASPIRIN 81 MG PO CHEW
81.0000 mg | CHEWABLE_TABLET | ORAL | Status: AC
  Administered 2022-10-15: 81 mg via ORAL
  Filled 2022-10-15: qty 1

## 2022-10-15 MED ORDER — ACETAMINOPHEN 325 MG PO TABS: 650.0000 mg | ORAL_TABLET | ORAL | Status: DC | PRN

## 2022-10-15 MED ORDER — FENTANYL CITRATE (PF) 100 MCG/2ML IJ SOLN
INTRAMUSCULAR | Status: DC | PRN
  Administered 2022-10-15 (×2): 25 ug via INTRAVENOUS

## 2022-10-15 MED ORDER — LORAZEPAM 0.5 MG PO TABS
0.5000 mg | ORAL_TABLET | Freq: Once | ORAL | Status: AC | PRN
  Administered 2022-10-15: 0.5 mg via ORAL
  Filled 2022-10-15: qty 1

## 2022-10-15 MED ORDER — SODIUM CHLORIDE 0.9% FLUSH: 3.0000 mL | INTRAVENOUS | Status: DC | PRN

## 2022-10-15 MED ORDER — ONDANSETRON HCL 4 MG/2ML IJ SOLN: 4.0000 mg | Freq: Four times a day (QID) | INTRAMUSCULAR | Status: DC | PRN

## 2022-10-15 MED ORDER — HYDRALAZINE HCL 20 MG/ML IJ SOLN: 10.0000 mg | INTRAMUSCULAR | Status: AC | PRN

## 2022-10-15 MED ORDER — ASPIRIN 81 MG PO CHEW
CHEWABLE_TABLET | ORAL | Status: AC
  Filled 2022-10-15: qty 3

## 2022-10-15 MED ORDER — SODIUM CHLORIDE 0.9 % WEIGHT BASED INFUSION: 1.0000 mL/kg/h | INTRAVENOUS | Status: DC

## 2022-10-15 MED ORDER — SODIUM CHLORIDE 0.9 % WEIGHT BASED INFUSION
1.0000 mL/kg/h | INTRAVENOUS | Status: AC
  Administered 2022-10-15: 1 mL/kg/h via INTRAVENOUS

## 2022-10-15 MED ORDER — SODIUM CHLORIDE 0.9% FLUSH
3.0000 mL | Freq: Two times a day (BID) | INTRAVENOUS | Status: DC
  Administered 2022-10-15 – 2022-10-16 (×2): 3 mL via INTRAVENOUS

## 2022-10-15 MED ORDER — LABETALOL HCL 5 MG/ML IV SOLN: 10.0000 mg | INTRAVENOUS | Status: AC | PRN

## 2022-10-15 MED ORDER — LIDOCAINE HCL 1 % IJ SOLN
INTRAMUSCULAR | Status: AC
  Filled 2022-10-15: qty 20

## 2022-10-15 MED ORDER — HEPARIN (PORCINE) IN NACL 1000-0.9 UT/500ML-% IV SOLN
INTRAVENOUS | Status: AC
  Filled 2022-10-15: qty 1000

## 2022-10-15 MED ORDER — CLOPIDOGREL BISULFATE 75 MG PO TABS
ORAL_TABLET | ORAL | Status: DC | PRN
  Administered 2022-10-15: 600 mg via ORAL

## 2022-10-15 MED ORDER — LIDOCAINE HCL (PF) 1 % IJ SOLN
INTRAMUSCULAR | Status: DC | PRN
  Administered 2022-10-15: 30 mL

## 2022-10-15 MED ORDER — MIDAZOLAM HCL 2 MG/2ML IJ SOLN
INTRAMUSCULAR | Status: DC | PRN
  Administered 2022-10-15: .5 mg via INTRAVENOUS
  Administered 2022-10-15: 1 mg via INTRAVENOUS

## 2022-10-15 MED ORDER — ENOXAPARIN SODIUM 40 MG/0.4ML IJ SOSY
40.0000 mg | PREFILLED_SYRINGE | INTRAMUSCULAR | Status: DC
  Administered 2022-10-15: 40 mg via SUBCUTANEOUS
  Filled 2022-10-15: qty 0.4

## 2022-10-15 MED ORDER — SODIUM CHLORIDE 0.9 % WEIGHT BASED INFUSION
1.0000 mL/kg/h | INTRAVENOUS | Status: DC
  Administered 2022-10-15: 1 mL/kg/h via INTRAVENOUS

## 2022-10-15 MED ORDER — BIVALIRUDIN TRIFLUOROACETATE 250 MG IV SOLR
INTRAVENOUS | Status: AC
  Filled 2022-10-15: qty 250

## 2022-10-15 MED ORDER — ASPIRIN 81 MG PO CHEW
CHEWABLE_TABLET | ORAL | Status: DC | PRN
  Administered 2022-10-15: 243 mg via ORAL

## 2022-10-15 MED ORDER — SODIUM CHLORIDE 0.9 % IV SOLN: 250.0000 mL | INTRAVENOUS | Status: DC | PRN

## 2022-10-15 MED ORDER — SODIUM CHLORIDE 0.9 % WEIGHT BASED INFUSION
3.0000 mL/kg/h | INTRAVENOUS | Status: AC
  Administered 2022-10-15: 3 mL/kg/h via INTRAVENOUS

## 2022-10-15 MED ORDER — HEPARIN (PORCINE) IN NACL 2000-0.9 UNIT/L-% IV SOLN
INTRAVENOUS | Status: DC | PRN
  Administered 2022-10-15: 1000 mL

## 2022-10-15 MED ORDER — CLOPIDOGREL BISULFATE 75 MG PO TABS
75.0000 mg | ORAL_TABLET | Freq: Every day | ORAL | Status: DC
  Administered 2022-10-16: 75 mg via ORAL
  Filled 2022-10-15: qty 1

## 2022-10-15 MED ORDER — SODIUM CHLORIDE 0.9 % WEIGHT BASED INFUSION: 3.0000 mL/kg/h | INTRAVENOUS | Status: DC

## 2022-10-15 MED ORDER — MIDAZOLAM HCL 2 MG/2ML IJ SOLN
INTRAMUSCULAR | Status: AC
  Filled 2022-10-15: qty 2

## 2022-10-15 MED ORDER — FENTANYL CITRATE (PF) 100 MCG/2ML IJ SOLN
INTRAMUSCULAR | Status: AC
  Filled 2022-10-15: qty 2

## 2022-10-15 MED ORDER — SODIUM CHLORIDE 0.9 % IV SOLN
INTRAVENOUS | Status: AC | PRN
  Administered 2022-10-15: 1.75 mg/kg/h via INTRAVENOUS

## 2022-10-15 MED ORDER — ASPIRIN 81 MG PO CHEW: 81.0000 mg | CHEWABLE_TABLET | ORAL | Status: DC

## 2022-10-15 MED ORDER — ASPIRIN 81 MG PO CHEW
81.0000 mg | CHEWABLE_TABLET | Freq: Every day | ORAL | Status: DC
  Administered 2022-10-16: 81 mg via ORAL
  Filled 2022-10-15: qty 1

## 2022-10-15 MED ORDER — BIVALIRUDIN BOLUS VIA INFUSION - CUPID
INTRAVENOUS | Status: DC | PRN
  Administered 2022-10-15: 68.7 mg via INTRAVENOUS

## 2022-10-15 SURGICAL SUPPLY — 25 items
BALLN TREK RX 2.5X15 (BALLOONS) ×1
BALLN ~~LOC~~ TREK NEO RX 3.0X12 (BALLOONS) ×1
BALLOON TREK RX 2.5X15 (BALLOONS) IMPLANT
BALLOON ~~LOC~~ TREK NEO RX 3.0X12 (BALLOONS) IMPLANT
CATH INFINITI 5 FR IM (CATHETERS) IMPLANT
CATH INFINITI 5FR MULTPACK ANG (CATHETERS) IMPLANT
CATH VISTA GUIDE 6FR XB3.5 SH (CATHETERS) IMPLANT
DEVICE CLOSURE MYNXGRIP 6/7F (Vascular Products) IMPLANT
DRAPE BRACHIAL (DRAPES) IMPLANT
KIT ENCORE 26 ADVANTAGE (KITS) IMPLANT
KIT SYRINGE INJ CVI SPIKEX1 (MISCELLANEOUS) IMPLANT
NDL PERC 18GX7CM (NEEDLE) IMPLANT
NEEDLE PERC 18GX7CM (NEEDLE) ×1 IMPLANT
PACK CARDIAC CATH (CUSTOM PROCEDURE TRAY) ×1 IMPLANT
PROTECTION STATION PRESSURIZED (MISCELLANEOUS) ×1
SET ATX-X65L (MISCELLANEOUS) IMPLANT
SHEATH AVANTI 5FR X 11CM (SHEATH) IMPLANT
SHEATH AVANTI 6FR X 11CM (SHEATH) IMPLANT
STATION PROTECTION PRESSURIZED (MISCELLANEOUS) IMPLANT
STENT ONYX FRONTIER 2.5X22 (Permanent Stent) IMPLANT
STENT ONYX FRONTIER 2.75X18 (Permanent Stent) IMPLANT
TUBING CIL FLEX 10 FLL-RA (TUBING) IMPLANT
WIRE ASAHI PROWATER 180CM (WIRE) IMPLANT
WIRE EMERALD 3MM-J .035X260CM (WIRE) IMPLANT
WIRE GUIDERIGHT .035X150 (WIRE) IMPLANT

## 2022-10-15 NOTE — Progress Notes (Signed)
ANTICOAGULATION CONSULT NOTE -   Pharmacy Consult for Heparin  Indication: ACS/NSTEMI   Allergies  Allergen Reactions   Morphine Swelling   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   Trazodone Other (See Comments)    Patient Measurements: Height: 6' (182.9 cm) Weight: 91.6 kg (202 lb) IBW/kg (Calculated) : 77.6 Heparin Dosing Weight: 91.6 kg   Vital Signs: Temp: 97.6 F (36.4 C) (08/05 0459) Temp Source: Oral (08/04 1939) BP: 103/60 (08/05 0459) Pulse Rate: 68 (08/05 0459)  Labs: Recent Labs    10/13/22 0313 10/13/22 0518 10/13/22 1300 10/13/22 1946 10/13/22 2052 10/14/22 0447 10/15/22 0554  HGB 15.0  --   --   --   --  12.7* 12.9*  HCT 45.0  --   --   --   --  38.8* 38.8*  PLT 406*  --   --   --   --  350 357  APTT 38*  --    < >  --  88* 80* 81*  LABPROT 15.1  --   --   --   --   --   --   INR 1.2  --   --   --   --   --   --   HEPARINUNFRC >1.10*  --   --   --   --  0.86* 0.90*  CREATININE 0.84  --   --   --   --   --  0.92  TROPONINIHS 728* 632*  --  634* 751*  --   --    < > = values in this interval not displayed.    Estimated Creatinine Clearance: 72.6 mL/min (by C-G formula based on SCr of 0.92 mg/dL).   Medical History: 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   Hypertension    Ischemic cardiomyopathy    MI, old    Peripheral neuropathy    PFO (patent foramen ovale) 05/2019   s/p repair   Sleep apnea    Spinal stenosis of lumbar region    TIA (transient ischemic attack)     Medications:  Medications Prior to Admission  Medication Sig Dispense Refill Last Dose   albuterol (VENTOLIN  HFA) 108 (90 Base) MCG/ACT inhaler Inhale 2 puffs into the lungs every 6 (six) hours as needed for wheezing or shortness of breath.      amiodarone (PACERONE) 200 MG tablet Take 200 mg by mouth daily.      atorvastatin (LIPITOR) 80 MG tablet Take 80 mg by mouth daily.      buPROPion (WELLBUTRIN XL) 300 MG 24 hr tablet Take 300 mg by mouth daily.      ELIQUIS 5 MG TABS tablet TAKE 1 TABLET BY MOUTH TWICE A DAY 180 tablet 1    ENTRESTO 97-103 MG TAKE 1 TABLET BY MOUTH TWICE A DAY 180 tablet 1    EPINEPHrine 0.3 mg/0.3 mL IJ SOAJ injection Inject 0.3 mg into the muscle as needed for anaphylaxis.      ezetimibe (ZETIA) 10 MG tablet Take 10 mg by mouth daily.  fluticasone (FLONASE) 50 MCG/ACT nasal spray SPRAY 2 SPRAYS INTO EACH NOSTRIL EVERY DAY      gabapentin (NEURONTIN) 300 MG capsule Take 300 mg by mouth 2 (two) times daily.      loratadine (CLARITIN) 10 MG tablet Take 10 mg by mouth daily.      metFORMIN (GLUCOPHAGE) 500 MG tablet Take 1,000 mg by mouth 2 (two) times daily.      nitroGLYCERIN (NITROSTAT) 0.4 MG SL tablet Place 0.4 mg under the tongue every 5 (five) minutes as needed for chest pain.       Assessment: Pharmacy consulted to dose heparin in this 77 year old male admitted with ACS/NSTEMI.  Pt was on Eliquis 5 mg PO BID PTA.  Pt unsure of exact time of last dose but "thinks it was last night (8/2 PM)".   CrCl = 79.6 ml/min   8/3 1300 aPTT=92 Therapeutic x 1 8/3 2052 aPTT=88 Therapeutic x 2 8/4 0447 aPTT=80,     HL = 0.86 8/5 0554 aPTT=81,     HL = 0.90   Goal of Therapy:  Heparin level 0.3-0.7 units/ml aPTT 66 - 102  seconds Monitor platelets by anticoagulation protocol: Yes   Plan:  8/5 @ 0554:   aPTT = 81, HL = 0.9 - aPTT therapeutic X 4,  HL still elevated from Eliquis PTA - Will continue pt on current rate and recheck HL and aPTT on 8/6 with AM labs.  - Adjust based on aPTT until correlation with HL - Daily CBC while on heparin  Briyan Kleven D,  PharmD 10/15/2022,6:49 AM

## 2022-10-15 NOTE — Progress Notes (Signed)
  PROGRESS NOTE    Devin [name].  WUJ:[id number] DOB: 08-27-1945 DOA: 10/13/2022 PCP: Devin Housekeeper, MD  252A/[id number]  LOS: 2 days   Brief hospital course:   Assessment & Plan: Devin [name]. is a 77 y.o. male with medical history significant of multiple medical issues including coronary artery disease status post CABG March 2021, atrial fibrillation status post cardioversion on anticoagulation, AAA, HFrEF, type 2 diabetes, hypertension, hyperlipidemia presenting with NSTEMI.  Patient reports recurring moderate chest pain over the past 4 days.  Chest pain is been intermittent and recurring.  Intensity has mildly progressed over this timeframe.  No reported nausea or diaphoresis.  Intensity became more severe over the past 24 hours-similar to prior episode of chest pain that led to CABG.  Took home nitroglycerin with some improvement in symptoms.    * NSTEMI (non-ST elevated myocardial infarction) (HCC) Troponin 728-->632 EKG grossly stable Started on heparin and nitro drip in the ER in the setting of persistent chest pain, with improvement of symptom. Plan: --cath today with sent to ostial left circumflex  --cont lipitor and Zetia --start ASA and plavix  Paroxysmal Atrial fibrillation status post cardioversion [facility]) On heparin drip in the setting of NSTEMI Plan: Hold Eliquis  Continue amiodarone --cont Lopressor  HFrEF (heart failure with reduced ejection fraction) (HCC) 2D echo March 2021 with a EF of 40 to 45% grade 1 diastolic dysfunction Appears euvolemic at present --current Echo showed LVEF improved to 50-55%, normal diastolic function.  Hyperlipidemia --cont statin  AAA (abdominal aortic aneurysm) (HCC) Followed by Dr. Gilda Crease in the outpatient setting-asymptomatic 4 cm AAA as of July 2024 office visit Outpatient f/u.  Controlled type 2 diabetes mellitus without complication, without long-term current use of insulin (HCC) --d/c'ed BG checks,  no need.   DVT prophylaxis: Lovenox SQ Code Status: Full code  Family Communication:  Level of care: Progressive Dispo:   The patient is from: home Anticipated d/c is to: home Anticipated d/c date is: tomorrow   Subjective and Interval History:  Underwent heart cath with stent placement today.  Tolerated it well.   Objective: Vitals:   10/15/22 1208 10/15/22 1245 10/15/22 1300 10/15/22 1329  BP: 120/62 110/73 (!) 110/59 (!) 113/59  Pulse:    66  Resp: (!) 25 15 17 18   Temp:    97.6 F (36.4 C)  TempSrc:    Oral  SpO2: 95% 95% 95% 95%  Weight:      Height:        Intake/Output Summary (Last 24 hours) at 10/15/2022 1614 Last data filed at 10/15/2022 0000 Gross per 24 hour  Intake 687.9 ml  Output 1050 ml  Net -362.1 ml   Filed Weights   10/13/22 0310  Weight: 91.6 kg    Examination:   Constitutional: NAD, AAOx3 HEENT: conjunctivae and lids normal, EOMI CV: No cyanosis.   RESP: normal respiratory effort, on RA Neuro: II - XII grossly intact.   Psych: Normal mood and affect.  Appropriate judgement and reason   Data Reviewed: I have personally reviewed labs and imaging studies  Time spent: 35 minutes  Darlin Priestly, MD [facility] If 7PM-7AM, please contact night-coverage 10/15/2022, 4:14 PM

## 2022-10-15 NOTE — Progress Notes (Signed)
SUBJECTIVE: Patient had PCI of the ostial left circumflex and postprocedure is feeling much better denies any chest pain or shortness of breath.   Vitals:   10/15/22 1130 10/15/22 1145 10/15/22 1208 10/15/22 1245  BP: 113/60 (!) 103/56 120/62 110/73  Pulse:      Resp: 17 (!) 21 (!) 25 15  Temp:      TempSrc:      SpO2:  95% 95% 95%  Weight:      Height:        Intake/Output Summary (Last 24 hours) at 10/15/2022 1248 Last data filed at 10/15/2022 0000 Gross per 24 hour  Intake 687.9 ml  Output 1150 ml  Net -462.1 ml    LABS: Basic Metabolic Panel: Recent Labs    10/13/22 0313 10/15/22 0554  NA 134* 139  K 4.7 4.0  CL 101 108  CO2 23 20*  GLUCOSE 107* 88  BUN 14 17  CREATININE 0.84 0.92  CALCIUM 9.2 8.7*  MG  --  2.4   Liver Function Tests: No results for input(s): "AST", "ALT", "ALKPHOS", "BILITOT", "PROT", "ALBUMIN" in the last 72 hours. No results for input(s): "LIPASE", "AMYLASE" in the last 72 hours. CBC: Recent Labs    10/14/22 0447 10/15/22 0554  WBC 10.1 11.0*  HGB 12.7* 12.9*  HCT 38.8* 38.8*  MCV 90.2 88.8  PLT 350 357   Cardiac Enzymes: No results for input(s): "CKTOTAL", "CKMB", "CKMBINDEX", "TROPONINI" in the last 72 hours. BNP: Invalid input(s): "POCBNP" D-Dimer: No results for input(s): "DDIMER" in the last 72 hours. Hemoglobin A1C: Recent Labs    10/13/22 0313  HGBA1C 6.3*   Fasting Lipid Panel: Recent Labs    10/13/22 0313  CHOL 105  HDL 59  LDLCALC 35  TRIG 55  CHOLHDL 1.8   Thyroid Function Tests: No results for input(s): "TSH", "T4TOTAL", "T3FREE", "THYROIDAB" in the last 72 hours.  Invalid input(s): "FREET3" Anemia Panel: No results for input(s): "VITAMINB12", "FOLATE", "FERRITIN", "TIBC", "IRON", "RETICCTPCT" in the last 72 hours.   PHYSICAL EXAM General: Well developed, well nourished, in no acute distress HEENT:  Normocephalic and atramatic Neck:  No JVD.  Lungs: Clear bilaterally to auscultation and  percussion. Heart: HRRR . Normal S1 and S2 without gallops or murmurs.  Abdomen: Bowel sounds are positive, abdomen soft and non-tender  Msk:  Back normal, normal gait. Normal strength and tone for age. Extremities: No clubbing, cyanosis or edema.   Neuro: Alert and oriented X 3. Psych:  Good affect, responds appropriately  TELEMETRY: Normal sinus rhythm about 65 bpm  ASSESSMENT AND PLAN: Non-STEMI.  High-grade lesion of ostial left circumflex requiring PCI and stenting by Dr. Daisy Blossom, feeling much better.  Continue current medications and plan on discharge tomorrow morning.   ICD-10-CM   1. NSTEMI (non-ST elevated myocardial infarction) (HCC)  I21.4       Principal Problem:   NSTEMI (non-ST elevated myocardial infarction) (HCC) Active Problems:   Controlled type 2 diabetes mellitus without complication, without long-term current use of insulin (HCC)   GERD (gastroesophageal reflux disease)   AAA (abdominal aortic aneurysm) (HCC)   Atrial fibrillation status post cardioversion [facility])   Hyperlipidemia   HFrEF (heart failure with reduced ejection fraction) (HCC)    Devin Blackwater, MD, [facility] 10/15/2022 12:48 PM

## 2022-10-16 ENCOUNTER — Encounter: Payer: Self-pay | Admitting: Cardiology

## 2022-10-16 DIAGNOSIS — I214 Non-ST elevation (NSTEMI) myocardial infarction: Secondary | ICD-10-CM | POA: Diagnosis not present

## 2022-10-16 DIAGNOSIS — I2510 Atherosclerotic heart disease of native coronary artery without angina pectoris: Secondary | ICD-10-CM | POA: Diagnosis not present

## 2022-10-16 DIAGNOSIS — R079 Chest pain, unspecified: Secondary | ICD-10-CM | POA: Diagnosis not present

## 2022-10-16 MED ORDER — ASPIRIN 81 MG PO CHEW: 81.0000 mg | CHEWABLE_TABLET | Freq: Every day | ORAL | 11 refills | Status: DC

## 2022-10-16 MED ORDER — APIXABAN 2.5 MG PO TABS: 2.5000 mg | ORAL_TABLET | Freq: Two times a day (BID) | ORAL | 2 refills | Status: DC

## 2022-10-16 MED ORDER — ENTRESTO 97-103 MG PO TABS: ORAL_TABLET | ORAL | Status: DC

## 2022-10-16 MED ORDER — CLOPIDOGREL BISULFATE 75 MG PO TABS: 75.0000 mg | ORAL_TABLET | Freq: Every day | ORAL | 11 refills | Status: AC

## 2022-10-16 MED ORDER — METOPROLOL TARTRATE 25 MG PO TABS: 25.0000 mg | ORAL_TABLET | Freq: Two times a day (BID) | ORAL | 2 refills | Status: DC

## 2022-10-16 NOTE — Plan of Care (Signed)
  Problem: Education: Goal: Knowledge of General Education information will improve Description: Including pain rating scale, medication(s)/side effects and non-pharmacologic comfort measures Outcome: Progressing   Problem: Clinical Measurements: Goal: Cardiovascular complication will be avoided Outcome: Progressing   Problem: Activity: Goal: Risk for activity intolerance will decrease Outcome: Progressing   Problem: Coping: Goal: Level of anxiety will decrease Outcome: Progressing   Problem: Elimination: Goal: Will not experience complications related to bowel motility Outcome: Progressing   Problem: Activity: Goal: Ability to tolerate increased activity will improve Outcome: Progressing

## 2022-10-16 NOTE — Discharge Summary (Signed)
Physician Discharge Summary   Devin Whitelow.  male DOB: 1945-07-14  WUJ:[id number]  PCP: Devin Housekeeper, MD  Admit date: 10/13/2022 Discharge date: 10/16/2022  Admitted From: home Disposition:  home CODE STATUS: Full code  Discharge Instructions     AMB Referral to Cardiac Rehabilitation - Phase II   Complete by: As directed    Diagnosis:  NSTEMI Coronary Stents     After initial evaluation and assessments completed: Virtual Based Care may be provided alone or in conjunction with Phase 2 Cardiac Rehab based on patient barriers.: Yes   Intensive Cardiac Rehabilitation (ICR) [facility] location only OR Traditional Cardiac Rehabilitation (TCR) *If criteria for ICR are not met will enroll in TCR [facility] only): Yes   Diet - low sodium heart healthy   Complete by: As directed    Discharge instructions   Complete by: As directed    Dr. Welton Flakes wants you to take aspirin 81 mg and plavix together for 12 months, and lowered your Eliquis from 5 mg to 2.5 mg twice a day.  I have made a new prescription for the Eliquis 2.5 mg tablets.  You are started on Lopressor.  Please hold home Entresto due to your low blood pressure.  Follow up with Dr. Welton Flakes. [facility] Course:  For full details, please see H&P, progress notes, consult notes and ancillary notes.  Briefly,  Devin Datu. is a 77 y.o. 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     * NSTEMI (non-ST elevated myocardial infarction) (HCC) Troponin 728-->632 Started on heparin and nitro drip in the ER in the setting of persistent chest pain, with improvement of symptom. --cath with stent to ostial left circumflex  --cont lipitor and Zetia --cont ASA and plavix for 12  months --started on Lopressor --f/u with cardio Dr. Welton Flakes    Paroxysmal Atrial fibrillation status post cardioversion [facility]) Continue amiodarone --started on Lopressor --Per Dr. Welton Flakes, cont Eliquis at reduced 2.5 mg BID due to being on triple therapy with ASA and plavix.   HFrEF (heart failure with reduced ejection fraction) (HCC) 2D echo March 2021 with a EF of 40 to 45% grade 1 diastolic dysfunction Appears euvolemic at present --current Echo showed LVEF improved to 50-55%, normal diastolic function. --started on Lopressor --Hold home Entresto due to low BP, pending f/u with Dr. Welton Flakes.   Hyperlipidemia --cont statin   AAA (abdominal aortic aneurysm) (HCC) Followed by Dr. Gilda Crease in the outpatient setting-asymptomatic 4 cm AAA as of July 2024 office visit Outpatient f/u.   Controlled type 2 diabetes mellitus without complication, without long-term current use of insulin (HCC) --A1c 6.3. --d/c'ed BG checks, no need.   Discharge Diagnoses:  Principal Problem:   NSTEMI (non-ST elevated myocardial infarction) [facility]) Active Problems:   Atrial fibrillation status post cardioversion [facility])   Controlled type 2 diabetes mellitus without complication, without long-term current use of insulin (HCC)   GERD (gastroesophageal reflux disease)   AAA (abdominal aortic aneurysm) (HCC)   Hyperlipidemia   HFrEF (heart failure with reduced ejection fraction) (HCC)   30 Day Unplanned Readmission Risk Score    Flowsheet Row ED to Hosp-Admission (Current) from 10/13/2022 in [facility] CARD[facility] PCU  30 Day Unplanned Readmission Risk Score (%) 12.43 Filed at 10/16/2022  0801       This score is the patient's risk of an unplanned readmission within 30 days of being discharged (0 -100%). The score is based on dignosis, age, lab data, medications, orders, and past utilization.   Low:  0-14.9   Medium: 15-21.9   High: 22-29.9   Extreme: 30 and above         Discharge  Instructions:  Allergies as of 10/16/2022       Reactions   Morphine Swelling   Yellow Jacket Venom [bee Venom] Anaphylaxis   Trazodone Other (See Comments)        Medication List     TAKE these medications    albuterol 108 (90 Base) MCG/ACT inhaler Commonly known as: VENTOLIN HFA Inhale 2 puffs into the lungs every 6 (six) hours as needed for wheezing or shortness of breath.   amiodarone 200 MG tablet Commonly known as: PACERONE Take 200 mg by mouth daily.   apixaban 2.5 MG Tabs tablet Commonly known as: ELIQUIS Take 1 tablet (2.5 mg total) by mouth 2 (two) times daily. What changed:  medication strength how much to take   aspirin 81 MG chewable tablet Chew 1 tablet (81 mg total) by mouth daily.   atorvastatin 80 MG tablet Commonly known as: LIPITOR Take 80 mg by mouth daily.   buPROPion 300 MG 24 hr tablet Commonly known as: WELLBUTRIN XL Take 300 mg by mouth daily.   clopidogrel 75 MG tablet Commonly known as: PLAVIX Take 1 tablet (75 mg total) by mouth daily with breakfast.   Entresto 97-103 MG Generic drug: sacubitril-valsartan Hold until followup with Dr. Welton Flakes due to low blood pressure. What changed:  how much to take how to take this when to take this additional instructions   EPINEPHrine 0.3 mg/0.3 mL Soaj injection Commonly known as: EPI-PEN Inject 0.3 mg into the muscle as needed for anaphylaxis.   ezetimibe 10 MG tablet Commonly known as: ZETIA Take 10 mg by mouth daily.   fluticasone 50 MCG/ACT nasal spray Commonly known as: FLONASE SPRAY 2 SPRAYS INTO EACH NOSTRIL EVERY DAY   gabapentin 300 MG capsule Commonly known as: NEURONTIN Take 300 mg by mouth 2 (two) times daily.   loratadine 10 MG tablet Commonly known as: CLARITIN Take 10 mg by mouth daily.   metFORMIN 500 MG tablet Commonly known as: GLUCOPHAGE Take 1,000 mg by mouth 2 (two) times daily.   metoprolol tartrate 25 MG tablet Commonly known as: LOPRESSOR Take 1 tablet  (25 mg total) by mouth 2 (two) times daily.   nitroGLYCERIN 0.4 MG SL tablet Commonly known as: NITROSTAT Place 0.4 mg under the tongue every 5 (five) minutes as needed for chest pain.         Follow-up Information     Laurier Nancy, MD Follow up on 10/18/2022.   Specialty: Cardiology Why: 9 am. Contact information: [address] [place] [address] [phone]                 Allergies  Allergen Reactions   Morphine Swelling   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   Trazodone Other (See Comments)     The results of significant diagnostics from this hospitalization (including imaging, microbiology, ancillary and laboratory) are listed below for reference.   Consultations:   Procedures/Studies: CARDIAC CATHETERIZATION  Result Date: 10/15/2022   Suezanne Jacquet Cx to Prox Cx lesion is 99% stenosed.   Prox Cx lesion is 60% stenosed.   2nd Mrg lesion is  20% stenosed.   Ost LAD to Prox LAD lesion is 100% stenosed.   Mid RCA lesion is 20% stenosed.   RPDA lesion is 20% stenosed.   Dist RCA lesion is 30% stenosed.   Mid LAD to Dist LAD lesion is 40% stenosed.   A drug-eluting stent was successfully placed using a STENT ONYX FRONTIER 2.5X22.   Post intervention, there is a 0% residual stenosis.   Post intervention, there is a 0% residual stenosis.   The left ventricular systolic function is normal.   LV end diastolic pressure is normal.   The left ventricular ejection fraction is 50-55% by visual estimate. 1.  NSTEMI 2.  Patent LIMA graft to distal LAD, patent stent to OM 2, high-grade 99% stenosis ostium left circumflex 3.  Normal left ventricular function 4.  Successful PCI with 2.5 x 22 mm Onyx frontier stent ostial left circumflex extending to ostium of left main Recommendations 1.  Dual antiplatelet therapy uninterrupted x 1 year 2.  Aggressive risk factor modification 3.  Probable discharge home in a.m.   ECHOCARDIOGRAM COMPLETE  Result Date: 10/13/2022    ECHOCARDIOGRAM  REPORT   Patient Name:   Devin [name] [facility]. Date of Exam: 10/13/2022 Medical Rec #:  [id number]               Height:       72.0 in Accession #:    [id number]              Weight:       202.0 lb Date of Birth:  1945/11/08               BSA:          2.140 m Patient Age:    76 years                BP:           118/67 mmHg Patient Gender: M                       HR:           70 bpm. Exam Location:  [facility] Procedure: 2D Echo Indications:     NSTEMI I21.4  History:         Patient has prior history of Echocardiogram examinations, most                  recent 12/14/2019.  Sonographer:     Overton Mam RDCS, FASE Referring Phys:  [id number] Francoise Schaumann [place]ON Diagnosing Phys: Marcina Millard MD  Sonographer Comments: No subcostal window. Image acquisition challenging due to patient body habitus. IMPRESSIONS  1. Left ventricular ejection fraction, by estimation, is 50 to 55%. The left ventricle has low normal function. The left ventricle has no regional wall motion abnormalities. Left ventricular diastolic parameters were normal.  2. Right ventricular systolic function is normal. The right ventricular size is normal.  3. The mitral valve is normal in structure. Mild to moderate mitral valve regurgitation. No evidence of mitral stenosis.  4. The aortic valve is normal in structure. Aortic valve regurgitation is not visualized. No aortic stenosis is present.  5. The inferior vena cava is normal in size with greater than 50% respiratory variability, suggesting right atrial pressure of 3 mmHg. FINDINGS  Left Ventricle: Left ventricular ejection fraction, by estimation, is 50 to 55%. The left ventricle has low normal function. The left ventricle has no regional wall motion abnormalities. The  left ventricular internal cavity size was normal in size. There is no left ventricular hypertrophy. Left ventricular diastolic parameters were normal. Right Ventricle: The right ventricular size is normal. No increase in right ventricular  wall thickness. Right ventricular systolic function is normal. Left Atrium: Left atrial size was normal in size. Right Atrium: Right atrial size was normal in size. Pericardium: There is no evidence of pericardial effusion. Mitral Valve: The mitral valve is normal in structure. Mild to moderate mitral valve regurgitation. No evidence of mitral valve stenosis. Tricuspid Valve: The tricuspid valve is normal in structure. Tricuspid valve regurgitation is mild . No evidence of tricuspid stenosis. Aortic Valve: The aortic valve is normal in structure. Aortic valve regurgitation is not visualized. No aortic stenosis is present. Aortic valve peak gradient measures 6.6 mmHg. Pulmonic Valve: The pulmonic valve was normal in structure. Pulmonic valve regurgitation is not visualized. No evidence of pulmonic stenosis. Aorta: The aortic root is normal in size and structure. Venous: The inferior vena cava is normal in size with greater than 50% respiratory variability, suggesting right atrial pressure of 3 mmHg. IAS/Shunts: No atrial level shunt detected by color flow Doppler.  LEFT VENTRICLE PLAX 2D LVIDd:         5.20 cm      Diastology LVIDs:         3.80 cm      LV e' medial:    8.59 cm/s LV PW:         1.30 cm      LV E/e' medial:  8.4 LV IVS:        1.00 cm      LV e' lateral:   14.30 cm/s LVOT diam:     2.20 cm      LV E/e' lateral: 5.0 LV SV:         53 LV SV Index:   25 LVOT Area:     3.80 cm  LV Volumes (MOD) LV vol d, MOD A4C: 155.0 ml LV vol s, MOD A4C: 64.8 ml LV SV MOD A4C:     155.0 ml RIGHT VENTRICLE RV Basal diam:  3.40 cm RV S prime:     9.90 cm/s TAPSE (M-mode): 1.6 cm LEFT ATRIUM             Index        RIGHT ATRIUM           Index LA diam:        4.80 cm 2.24 cm/m   RA Area:     14.30 cm LA Vol (A2C):   81.8 ml 38.23 ml/m  RA Volume:   34.50 ml  16.12 ml/m LA Vol (A4C):   53.6 ml 25.05 ml/m LA Biplane Vol: 68.8 ml 32.16 ml/m  AORTIC VALVE                 PULMONIC VALVE AV Area (Vmax): 1.83 cm     PV  Vmax:          0.90 m/s AV Vmax:        128.00 cm/s  PV Peak grad:     3.2 mmHg AV Peak Grad:   6.6 mmHg     PR End Diast Vel: 5.76 msec LVOT Vmax:      61.50 cm/s   RVOT Peak grad:   3 mmHg LVOT Vmean:     39.900 cm/s LVOT VTI:       0.140 m  AORTA Ao Root diam: 3.80 cm Ao  Asc diam:  3.40 cm MITRAL VALVE               TRICUSPID VALVE MV Area (PHT): 2.82 cm    TV Peak grad:   22.5 mmHg MV Decel Time: 269 msec    TV Vmax:        2.37 m/s MV E velocity: 71.80 cm/s MV A velocity: 70.70 cm/s  SHUNTS MV E/A ratio:  1.02        Systemic VTI:  0.14 m                            Systemic Diam: 2.20 cm Marcina Millard MD Electronically signed by Marcina Millard MD Signature Date/Time: 10/13/2022/11:32:52 AM    Final    DG Chest 2 View  Result Date: 10/13/2022 CLINICAL DATA:  Chest pain. EXAM: CHEST - 2 VIEW COMPARISON:  04/07/2020 FINDINGS: Lungs are clear. No pneumothorax or pleural effusion. Coronary artery bypass grafting has been performed. Cardiac size within normal limits. Pulmonary vascularity is normal. Neurostimulator battery pack overlies the right hemithorax with single lead extending to the right neck base, unchanged. Stable retained lead within the right chest wall. Osseous structures are age-appropriate. No acute bone abnormality. IMPRESSION: 1. No active cardiopulmonary disease. Electronically Signed   By: Helyn Numbers M.D.   On: 10/13/2022 04:14   VAS [place] AAA DUPLEX  Result Date: 10/08/2022 ABDOMINAL AORTA STUDY Patient Name:  Mayan Fillers [facility].  Date of Exam:   10/08/2022 Medical Rec #: [id number]                Accession #:    [id number] Date of Birth: 1945-07-29                Patient Gender: M Patient Age:   27 years Exam Location:  [facility] Procedure:      VAS [place] AAA DUPLEX Referring Phys: Levora Dredge --------------------------------------------------------------------------------  Indications: Follow up exam for known AAA. Limitations: Air/bowel gas.  Comparison  Study: 08/2021 Performing Technologist: Salvadore Farber RVT  Examination Guidelines: A complete evaluation includes B-mode imaging, spectral Doppler, color Doppler, and power Doppler as needed of all accessible portions of each vessel. Bilateral testing is considered an integral part of a complete examination. Limited examinations for reoccurring indications may be performed as noted.  Abdominal Aorta Findings: +-----------+-------+----------+----------+--------+--------+--------+ Location   AP (cm)Trans (cm)PSV (cm/s)WaveformThrombusComments +-----------+-------+----------+----------+--------+--------+--------+ Proximal   2.02   2.19      49                                 +-----------+-------+----------+----------+--------+--------+--------+ Mid        2.92   2.94      48                                 +-----------+-------+----------+----------+--------+--------+--------+ Distal     1.68   1.71      56                                 +-----------+-------+----------+----------+--------+--------+--------+ RT CIA Prox1.3    1.3       64                                 +-----------+-------+----------+----------+--------+--------+--------+  LT CIA Prox1.4    1.3       51                                 +-----------+-------+----------+----------+--------+--------+--------+  Summary: Abdominal Aorta: There is evidence of abnormal dilatation of the mid Abdominal aorta. The largest aortic measurement is 2.9 cm. Mild dilitation of the mid abdominal aorta compared to proximal and distal dimensions. Mild dilitation of the proximal left CIA. No chamge from previous study. The largest aortic diameter remains essentially unchanged compared to prior exam. Previous diameter measurement was 2.7 cm obtained on 08/2021.  *See table(s) above for measurements and observations.  Electronically signed by Levora Dredge MD on 10/08/2022 at 3:10:03 PM.    Final       Labs: BNP (last 3  results) No results for input(s): "BNP" in the last 8760 hours. Basic Metabolic Panel: Recent Labs  Lab 10/13/22 0313 10/15/22 0554 10/16/22 0610  NA 134* 139 139  K 4.7 4.0 4.1  CL 101 108 107  CO2 23 20* 25  GLUCOSE 107* 88 100*  BUN 14 17 10   CREATININE 0.84 0.92 0.89  CALCIUM 9.2 8.7* 8.8*  MG  --  2.4 2.3   Liver Function Tests: No results for input(s): "AST", "ALT", "ALKPHOS", "BILITOT", "PROT", "ALBUMIN" in the last 168 hours. No results for input(s): "LIPASE", "AMYLASE" in the last 168 hours. No results for input(s): "AMMONIA" in the last 168 hours. CBC: Recent Labs  Lab 10/13/22 0313 10/14/22 0447 10/15/22 0554 10/16/22 0610  WBC 10.7* 10.1 11.0* 10.8*  HGB 15.0 12.7* 12.9* 13.8  HCT 45.0 38.8* 38.8* 43.4  MCV 90.9 90.2 88.8 91.8  PLT 406* 350 357 336   Cardiac Enzymes: No results for input(s): "CKTOTAL", "CKMB", "CKMBINDEX", "TROPONINI" in the last 168 hours. BNP: Invalid input(s): "POCBNP" CBG: Recent Labs  Lab 10/13/22 2118 10/14/22 0852 10/14/22 1158 10/14/22 1600 10/15/22 0734  GLUCAP 125* 124* 101* 161* 94   D-Dimer No results for input(s): "DDIMER" in the last 72 hours. Hgb A1c No results for input(s): "HGBA1C" in the last 72 hours. Lipid Profile No results for input(s): "CHOL", "HDL", "LDLCALC", "TRIG", "CHOLHDL", "LDLDIRECT" in the last 72 hours. Thyroid function studies No results for input(s): "TSH", "T4TOTAL", "T3FREE", "THYROIDAB" in the last 72 hours.  Invalid input(s): "FREET3" Anemia work up No results for input(s): "VITAMINB12", "FOLATE", "FERRITIN", "TIBC", "IRON", "RETICCTPCT" in the last 72 hours. Urinalysis    Component Value Date/Time   COLORURINE YELLOW (A) 05/15/2022 1511   APPEARANCEUR CLEAR (A) 05/15/2022 1511   LABSPEC 1.009 05/15/2022 1511   PHURINE 6.0 05/15/2022 1511   GLUCOSEU NEGATIVE 05/15/2022 1511   HGBUR NEGATIVE 05/15/2022 1511   BILIRUBINUR NEGATIVE 05/15/2022 1511   KETONESUR NEGATIVE 05/15/2022  1511   PROTEINUR NEGATIVE 05/15/2022 1511   NITRITE NEGATIVE 05/15/2022 1511   LEUKOCYTESUR NEGATIVE 05/15/2022 1511   Sepsis Labs Recent Labs  Lab 10/13/22 0313 10/14/22 0447 10/15/22 0554 10/16/22 0610  WBC 10.7* 10.1 11.0* 10.8*   Microbiology No results found for this or any previous visit (from the past 240 hour(s)).   Total time spend on discharging this patient, including the last patient exam, discussing the hospital stay, instructions for ongoing care as it relates to all pertinent caregivers, as well as preparing the medical discharge records, prescriptions, and/or referrals as applicable, is 35 minutes.    Darlin Priestly, MD  [facility] 10/16/2022, 8:16 AM

## 2022-10-16 NOTE — Progress Notes (Signed)
SUBJECTIVE: Denies chest pin  Vitals:   10/15/22 2005 10/16/22 0002 10/16/22 0427 10/16/22 0759  BP: 115/66 100/63 (!) 102/90 116/74  Pulse: 64 (!) 58 62 65  Resp: 17 18 16 20   Temp: 97.6 F (36.4 C) 98 F (36.7 C) 98.7 F (37.1 C) 98.4 F (36.9 C)  TempSrc:      SpO2: 96% 96% 94% 93%  Weight:      Height:        Intake/Output Summary (Last 24 hours) at 10/16/2022 0800 Last data filed at 10/16/2022 0300 Gross per 24 hour  Intake --  Output 400 ml  Net -400 ml    LABS: Basic Metabolic Panel: Recent Labs    10/15/22 0554 10/16/22 0610  NA 139 139  K 4.0 4.1  CL 108 107  CO2 20* 25  GLUCOSE 88 100*  BUN 17 10  CREATININE 0.92 0.89  CALCIUM 8.7* 8.8*  MG 2.4 2.3   Liver Function Tests: No results for input(s): "AST", "ALT", "ALKPHOS", "BILITOT", "PROT", "ALBUMIN" in the last 72 hours. No results for input(s): "LIPASE", "AMYLASE" in the last 72 hours. CBC: Recent Labs    10/15/22 0554 10/16/22 0610  WBC 11.0* 10.8*  HGB 12.9* 13.8  HCT 38.8* 43.4  MCV 88.8 91.8  PLT 357 336   Cardiac Enzymes: No results for input(s): "CKTOTAL", "CKMB", "CKMBINDEX", "TROPONINI" in the last 72 hours. BNP: Invalid input(s): "POCBNP" D-Dimer: No results for input(s): "DDIMER" in the last 72 hours. Hemoglobin A1C: No results for input(s): "HGBA1C" in the last 72 hours. Fasting Lipid Panel: No results for input(s): "CHOL", "HDL", "LDLCALC", "TRIG", "CHOLHDL", "LDLDIRECT" in the last 72 hours. Thyroid Function Tests: No results for input(s): "TSH", "T4TOTAL", "T3FREE", "THYROIDAB" in the last 72 hours.  Invalid input(s): "FREET3" Anemia Panel: No results for input(s): "VITAMINB12", "FOLATE", "FERRITIN", "TIBC", "IRON", "RETICCTPCT" in the last 72 hours.   PHYSICAL EXAM General: Well developed, well nourished, in no acute distress HEENT:  Normocephalic and atramatic Neck:  No JVD.  Lungs: Clear bilaterally to auscultation and percussion. Heart: HRRR . Normal S1 and S2  without gallops or murmurs.  Abdomen: Bowel sounds are positive, abdomen soft and non-tender  Msk:  Back normal, normal gait. Normal strength and tone for age. Extremities: No clubbing, cyanosis or edema.   Neuro: Alert and oriented X 3. Psych:  Good affect, responds appropriately  TELEMETRY: NSR 75/min  ASSESSMENT AND PLAN: NSTEMI/PCI Osteal LCX with protected left main. Duel antiplatelets plavix/asp 12 months and continue elliquis. Dischrage with f/u this Thursday 9 am.   ICD-10-CM   1. NSTEMI (non-ST elevated myocardial infarction) (HCC)  I21.4       Principal Problem:   NSTEMI (non-ST elevated myocardial infarction) (HCC) Active Problems:   Controlled type 2 diabetes mellitus without complication, without long-term current use of insulin (HCC)   GERD (gastroesophageal reflux disease)   AAA (abdominal aortic aneurysm) (HCC)   Atrial fibrillation status post cardioversion (HCC)   Hyperlipidemia   HFrEF (heart failure with reduced ejection fraction) (HCC)    Adrian Blackwater, MD, [facility] 10/16/2022 8:00 AM

## 2022-10-18 ENCOUNTER — Ambulatory Visit: Payer: Medicare HMO | Admitting: Cardiovascular Disease

## 2022-10-18 ENCOUNTER — Encounter: Payer: Self-pay | Admitting: Cardiovascular Disease

## 2022-10-18 VITALS — BP 162/64 | HR 78 | Ht 72.0 in | Wt 204.0 lb

## 2022-10-18 DIAGNOSIS — I5020 Unspecified systolic (congestive) heart failure: Secondary | ICD-10-CM | POA: Diagnosis not present

## 2022-10-18 DIAGNOSIS — I25119 Atherosclerotic heart disease of native coronary artery with unspecified angina pectoris: Secondary | ICD-10-CM

## 2022-10-18 DIAGNOSIS — I5042 Chronic combined systolic (congestive) and diastolic (congestive) heart failure: Secondary | ICD-10-CM

## 2022-10-18 DIAGNOSIS — I214 Non-ST elevation (NSTEMI) myocardial infarction: Secondary | ICD-10-CM

## 2022-10-18 DIAGNOSIS — I4891 Unspecified atrial fibrillation: Secondary | ICD-10-CM | POA: Diagnosis not present

## 2022-10-18 DIAGNOSIS — I7143 Infrarenal abdominal aortic aneurysm, without rupture: Secondary | ICD-10-CM | POA: Diagnosis not present

## 2022-10-18 DIAGNOSIS — I259 Chronic ischemic heart disease, unspecified: Secondary | ICD-10-CM

## 2022-10-18 NOTE — Progress Notes (Signed)
Cardiology Office Note   Date:  10/18/2022   ID:  Devin [name]., DOB 12/22/1945, MRN [id number]  PCP:  Dione Housekeeper, MD  Cardiologist:  Adrian Blackwater, MD      History of Present Illness: Devin [name]. is a 77 y.o. male who presents for  Chief Complaint  Patient presents with   Acute Visit   Follow-up    Hospital Follow Up    Patient denies any chest pain or shortness of breath.  Patient was not taking Entresto in the hospital because of hypotension thus will restart the patient on Entresto.  Patient had PCI and stenting of ostial left circumflex which was protected with LIMA to the LAD.  He was admitted to the hospital with non-STEMI with peak troponin over 700.  He denies any further chest pain.      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   Hypertension    Ischemic cardiomyopathy    MI, old    Peripheral neuropathy    PFO (patent foramen ovale) 05/2019   s/p repair   Sleep apnea    Spinal stenosis of lumbar region    TIA (transient ischemic attack)      Past Surgical History:  Procedure Laterality Date   APPENDECTOMY     BREAST SURGERY Right    benign mass   CARDIOVERSION Right 12/15/2019   Procedure: CARDIOVERSION;  Surgeon: Laurier Nancy, MD;  Location: [facility] ORS;  Service: Cardiovascular;  Laterality: Right;   CATARACT EXTRACTION, BILATERAL     COLONOSCOPY     CORONARY ANGIOPLASTY WITH STENT PLACEMENT     CORONARY ARTERY BYPASS GRAFT  05/2019   LIMA-LAD   CORONARY STENT INTERVENTION N/A 10/15/2022   Procedure: CORONARY STENT INTERVENTION;  Surgeon: Marcina Millard, MD;   Location: [facility] INVASIVE CV LAB;  Service: Cardiovascular;  Laterality: N/A;   LEFT HEART CATH N/A 10/15/2022   Procedure: Left Heart Cath;  Surgeon: Marcina Millard, MD;  Location: [facility] INVASIVE CV LAB;  Service: Cardiovascular;  Laterality: N/A;   LEFT HEART CATH AND CORS/GRAFTS ANGIOGRAPHY N/A 05/21/2019   Procedure: LEFT HEART CATH AND CORONARY ANGIOGRAPHY;  Surgeon: Laurier Nancy, MD;  Location: [facility] INVASIVE CV LAB;  Service: Cardiovascular;  Laterality: N/A;   LUMBAR LAMINECTOMY/DECOMPRESSION MICRODISCECTOMY N/A 07/04/2020   Procedure: L4-5 LAMINECTOMY;  Surgeon: Lucy Chris, MD;  Location: [facility] ORS;  Service: Neurosurgery;  Laterality: N/A;   REPLACEMENT TOTAL KNEE BILATERAL       Current Outpatient Medications  Medication Sig Dispense Refill   albuterol (VENTOLIN HFA) 108 (90 Base) MCG/ACT inhaler Inhale 2 puffs into the lungs every 6 (six) hours as needed for wheezing or shortness of breath.     amiodarone (PACERONE) 200 MG tablet Take 200 mg by mouth daily.     apixaban (ELIQUIS) 2.5 MG TABS tablet Take 1 tablet (2.5 mg total) by mouth 2 (two) times daily. 60 tablet 2   aspirin 81 MG chewable tablet Chew 1 tablet (81 mg total) by mouth daily. 30 tablet 11  atorvastatin (LIPITOR) 80 MG tablet Take 80 mg by mouth daily.     buPROPion (WELLBUTRIN XL) 300 MG 24 hr tablet Take 300 mg by mouth daily.     clopidogrel (PLAVIX) 75 MG tablet Take 1 tablet (75 mg total) by mouth daily with breakfast. 30 tablet 11   EPINEPHrine 0.3 mg/0.3 mL IJ SOAJ injection Inject 0.3 mg into the muscle as needed for anaphylaxis.     ezetimibe (ZETIA) 10 MG tablet Take 10 mg by mouth daily.     fluticasone (FLONASE) 50 MCG/ACT nasal spray SPRAY 2 SPRAYS INTO EACH NOSTRIL EVERY DAY     gabapentin (NEURONTIN) 300 MG capsule Take 300 mg by mouth 2 (two) times daily.     loratadine (CLARITIN) 10 MG tablet Take 10 mg by mouth daily.     metFORMIN (GLUCOPHAGE) 500 MG tablet Take 1,000 mg by mouth 2 (two)  times daily.     metoprolol tartrate (LOPRESSOR) 25 MG tablet Take 1 tablet (25 mg total) by mouth 2 (two) times daily. 60 tablet 2   nitroGLYCERIN (NITROSTAT) 0.4 MG SL tablet Place 0.4 mg under the tongue every 5 (five) minutes as needed for chest pain.     sacubitril-valsartan (ENTRESTO) 97-103 MG Hold until followup with Dr. Welton Flakes due to low blood pressure.     No current facility-administered medications for this visit.    Allergies:   Morphine, Yellow jacket venom [bee venom], and Trazodone    Social History:   reports that he has quit smoking. He has never used smokeless tobacco. He reports that he does not currently use alcohol. He reports that he does not use drugs.   Family History:  family history includes Osteoarthritis in his mother; Other (age of onset: 26) in his father.    ROS:     Review of Systems  Constitutional: Negative.   HENT: Negative.    Eyes: Negative.   Respiratory: Negative.    Gastrointestinal: Negative.   Genitourinary: Negative.   Musculoskeletal: Negative.   Skin: Negative.   Neurological: Negative.   Endo/Heme/Allergies: Negative.   Psychiatric/Behavioral: Negative.    All other systems reviewed and are negative.     All other systems are reviewed and negative.    PHYSICAL EXAM: VS:  BP (!) 162/64   Pulse 78   Ht 6' (1.829 m)   Wt 204 lb (92.5 kg)   SpO2 97%   BMI 27.67 kg/m  , BMI Body mass index is 27.67 kg/m. Last weight:  Wt Readings from Last 3 Encounters:  10/18/22 204 lb (92.5 kg)  10/13/22 202 lb (91.6 kg)  10/08/22 202 lb (91.6 kg)     Physical Exam Vitals reviewed.  Constitutional:      Appearance: Normal appearance. He is normal weight.  HENT:     Head: Normocephalic.     Nose: Nose normal.     Mouth/Throat:     Mouth: Mucous membranes are moist.  Eyes:     Pupils: Pupils are equal, round, and reactive to light.  Cardiovascular:     Rate and Rhythm: Normal rate and regular rhythm.     Pulses: Normal pulses.      Heart sounds: Normal heart sounds.  Pulmonary:     Effort: Pulmonary effort is normal.  Abdominal:     General: Abdomen is flat. Bowel sounds are normal.  Musculoskeletal:        General: Normal range of motion.     Cervical back: Normal range of motion.  Skin:    General: Skin is warm.  Neurological:     General: No focal deficit present.     Mental Status: He is alert.  Psychiatric:        Mood and Affect: Mood normal.       EKG:   Recent Labs: 04/13/2022: ALT 30 10/16/2022: BUN 10; Creatinine, Ser 0.89; Hemoglobin 13.8; Magnesium 2.3; Platelets 336; Potassium 4.1; Sodium 139    Lipid Panel    Component Value Date/Time   CHOL 105 10/13/2022 0313   TRIG 55 10/13/2022 0313   HDL 59 10/13/2022 0313   CHOLHDL 1.8 10/13/2022 0313   VLDL 11 10/13/2022 0313   LDLCALC 35 10/13/2022 0313      Other studies Reviewed: Additional studies/ records that were reviewed today include:  Review of the above records demonstrates:       No data to display            ASSESSMENT AND PLAN:    ICD-10-CM   1. Infrarenal abdominal aortic aneurysm (AAA) without rupture (HCC)  I71.43     2. Atrial fibrillation status post cardioversion (HCC)  I48.91     3. Coronary artery disease involving native coronary artery of native heart with angina pectoris (HCC)  I25.119     4. HFrEF (heart failure with reduced ejection fraction) (HCC)  I50.20     5. Chronic ischemic heart disease  I25.9     6. Chronic combined systolic and diastolic congestive heart failure (HCC)  I50.42     7. NSTEMI (non-ST elevated myocardial infarction) (HCC)  I21.4        Problem List Items Addressed This Visit       Cardiovascular and Mediastinum   CAD (coronary artery disease)   Congestive heart failure (CHF) (HCC)   AAA (abdominal aortic aneurysm) (HCC) - Primary   Atrial fibrillation status post cardioversion [facility])   Chronic ischemic heart disease   NSTEMI (non-ST elevated myocardial infarction)  (HCC)   HFrEF (heart failure with reduced ejection fraction) (HCC)       Disposition:   Return in about 6 weeks (around 11/29/2022).    Total time spent: 40 minutes  Signed,  Adrian Blackwater, MD  10/18/2022 10:04 AM    [facility]

## 2022-10-22 ENCOUNTER — Ambulatory Visit: Payer: Medicare HMO | Attending: Family Medicine

## 2022-10-22 DIAGNOSIS — M6281 Muscle weakness (generalized): Secondary | ICD-10-CM

## 2022-10-22 DIAGNOSIS — R2689 Other abnormalities of gait and mobility: Secondary | ICD-10-CM

## 2022-10-22 DIAGNOSIS — R296 Repeated falls: Secondary | ICD-10-CM | POA: Diagnosis present

## 2022-10-22 NOTE — Therapy (Signed)
OUTPATIENT PHYSICAL THERAPY LOWER EXTREMITY EVALUATION   Patient Name: Devin [name]. MRN: [id number] DOB:[birth year], 77 y.o., male Today's Date: 10/23/2022  END OF SESSION:  PT End of Session - 10/22/22 2108     Visit Number 1    Number of Visits 10    Date for PT Re-Evaluation 12/04/22    Authorization Type 2x/week x 6 weeks (re-cert/PN 5/78/46)    PT Start Time 1200    PT Stop Time 1245    PT Time Calculation (min) 45 min    Activity Tolerance Patient tolerated treatment well    Behavior During Therapy WFL for tasks assessed/performed             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   Hypertension    Ischemic cardiomyopathy    MI, old    Peripheral neuropathy    PFO (patent foramen ovale) 05/2019   s/p repair   Sleep apnea    Spinal stenosis of lumbar region    TIA (transient ischemic attack)    Past Surgical History:  Procedure Laterality Date   APPENDECTOMY     BREAST SURGERY Right    benign mass   CARDIOVERSION Right 12/15/2019   Procedure: CARDIOVERSION;  Surgeon: Laurier Nancy, MD;  Location: [facility] ORS;  Service: Cardiovascular;  Laterality: Right;   CATARACT EXTRACTION, BILATERAL     COLONOSCOPY     CORONARY ANGIOPLASTY WITH STENT PLACEMENT     CORONARY ARTERY BYPASS GRAFT  05/2019   LIMA-LAD   CORONARY STENT INTERVENTION N/A 10/15/2022   Procedure: CORONARY STENT INTERVENTION;  Surgeon: Marcina Millard, MD;  Location: [facility] INVASIVE CV LAB;  Service: Cardiovascular;  Laterality: N/A;   LEFT HEART CATH N/A 10/15/2022   Procedure: Left Heart Cath;  Surgeon: Marcina Millard, MD;   Location: [facility] INV[facility];  Service: Cardiovascular;  Laterality: N/A;   LEFT HEART CATH AND CORS/GRAFTS ANGIOGRAPHY N/A 05/21/2019   Procedure: LEFT HEART CATH AND CORONARY ANGIOGRAPHY;  Surgeon: Laurier Nancy, MD;  Location: [facility] INVASIVE CV LAB;  Service: Cardiovascular;  Laterality: N/A;   LUMBAR LAMINECTOMY/DECOMPRESSION MICRODISCECTOMY N/A 07/04/2020   Procedure: L4-5 LAMINECTOMY;  Surgeon: Lucy Chris, MD;  Location: [facility] ORS;  Service: Neurosurgery;  Laterality: N/A;   REPLACEMENT TOTAL KNEE BILATERAL     Patient Active Problem List   Diagnosis Date Noted   NSTEMI (non-ST elevated myocardial infarction) (HCC) 10/13/2022   HFrEF (heart failure with reduced ejection fraction) (HCC) 10/13/2022   Degenerative joint disease (DJD) of lumbar spine 10/12/2022   Frequent falls 05/15/2022   Unsteady gait 04/16/2022   Acute delirium 04/13/2022   Electrolyte abnormality 04/13/2022   Nausea & vomiting 04/13/2022   PAD (peripheral artery disease) (HCC) 09/03/2021   Localized, primary osteoarthritis 11/24/2020   Wound healing, delayed 09/08/2020   History of lumbar surgery 09/08/2020   Chronic ischemic heart disease 07/13/2020   History of MI (myocardial infarction) 07/13/2020   History of PTCA 1 07/13/2020   Other personal history presenting hazards to health 07/13/2020   Pure hypercholesterolemia 07/13/2020  Elevated lactic acid level 04/19/2020   Generalized weakness 04/09/2020   Fall at home 04/09/2020   Gastroenteritis 04/09/2020   AMS (altered mental status) 04/08/2020   Hypoxia 02/01/2020   Atrial fibrillation status post cardioversion (HCC) 12/22/2019   Congestive heart failure (CHF) (HCC) 12/13/2019   Hyponatremia 12/06/2019   Acute postoperative anemia due to expected blood loss 05/28/2019   Hyperlipidemia 05/28/2019   Ischemic cardiomyopathy 05/28/2019   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   Unstable angina (HCC) 05/24/2019   Chest pain 05/21/2019    Ischemic chest pain (HCC) 05/16/2019   CAD (coronary artery disease) 05/16/2019   HTN (hypertension) 05/16/2019   Depression 05/16/2019   GERD (gastroesophageal reflux disease) 05/16/2019   Verruca plantaris 01/23/2018   Obstructive sleep apnea syndrome 01/20/2018   Major depressive disorder, single episode, moderate (HCC) 07/05/2016   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 Zada Finders, MD  REFERRING PROVIDER: Dr. Zada Finders, MD/ [name], [facility]  REFERRING DIAG:  R26.9 (ICD-10-CM) - Unspecified abnormalities of gait and mobility  R26.89 (ICD-10-CM) - Other abnormalities of gait and mobility  R29.6 (ICD-10-CM) - Repeated falls    THERAPY DIAG:  Muscle weakness (generalized)  Other abnormalities of gait and mobility  Balance problem  Repeated falls  Rationale for Evaluation and Treatment: Rehabilitation  ONSET DATE: at least 2 years  SUBJECTIVE:   SUBJECTIVE STATEMENT: Pt reports he is having difficulty with gait and balance.  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   He feels like his balance and walking difficulty have worsened recently.  He reports he started falling frequently, he was catching his foot on things and tripping.  The most recent major fall was about 6 months ago.  He is being careful since then and trying to walk more to get stronger and trying to be more aware of his gait pattern.  He was walking with a walker until ~4 months ago, then started using a cane, and then has tried walking without any assistance.      He feels like his difficulty walking limits his ability to get out of the house  as much as he used to because he is fearful of falling.  PLOF- he enjoys getting out of the house for social activities, going out to breakfast too; going grocery shopping a few times/week to get his groceries; prior to his back surgery he walked at the park in [place], hasn't been back though because he is worried about falling though.  He has a home aide that comes a few days a week to assist with household tasks as needed.  Also reports he had a MI a little over a week ago and was in the hospital (8/3-10/16/22)- he states he is referred to begin cardiac rehab as part of his outpatient recovery.  PERTINENT HISTORY: Extensive cardiac history History of lumbar spine surgery- 2022 L4-5 microdiscectomy/laminectomy   PAIN:  Are you having pain? R groin muscle area where he had the cardiac catheterization procedure last week.     PRECAUTIONS: Fall  RED FLAGS: Pt with recent MI (10/13/22) with stent placement (see chart review)  WEIGHT BEARING RESTRICTIONS:  No  FALLS:  Has patient fallen in last 6 months? Yes. Number of falls multiple  LIVING ENVIRONMENT: Lives with: lives alone (has a self reported "lady friend" for social support and enjoys being able to get out the house for community activities, going to restaurants together) Lives in: House/apartment Stairs:  he lives in a single level home with bonus room over the garage, but he does not need to go upstairs.1 step to enter in the garage- able to get in without falling. Has following equipment at home: Single point cane, Walker - 4 wheeled, and Wheelchair (manual) Has a home health aide that comes 2-3x/week if he needs assistance  OCCUPATION: retired  PLOF: Independent  PATIENT GOALS: to address drop foot and be able to manage it better; to strengthen his legs; and to increase his breathing; and improve his balance   NEXT MD VISIT: yes, next week   OBJECTIVE:   DIAGNOSTIC FINDINGS: no recent lower extremity/spine imaging  PATIENT  SURVEYS:  FOTO 39/52  COGNITION: Overall cognitive status: Within functional limits for tasks assessed     SENSATION: Pt reports reduced sensation to light touch L L4-5 dermatomes  EDEMA:  No pitting edema noted on dorsum of foot   POSTURE: pt stands with wide base of support for balance; uses UE support on LE's for sit to stand transfer  PALPATION: (-) TTP distal LE b/l  LOWER EXTREMITY ROM:  Active ROM Right eval Left eval  Hip flexion WNL WNL  Hip extension    Hip abduction    Hip adduction    Hip internal rotation    Hip external rotation    Knee flexion 105 105  Knee extension -5 -5  Ankle dorsiflexion 5 deg PROM 0 deg  Ankle plantarflexion    Great toe extension 45 deg PROM 40 deg   Ankle eversion     (Blank rows = not tested)  LOWER EXTREMITY MMT:  MMT Right eval Left eval  Hip flexion 4 4  Hip extension    Hip abduction    Hip adduction    Hip internal rotation    Hip external rotation    Knee flexion 5 5  Knee extension 4 4  Ankle dorsiflexion 4/5 1/5  Ankle plantarflexion 3/5 3/5  Great toe extension 4/5 2/5  Ankle eversion 4/5 3/5   (Blank rows = not tested)   FUNCTIONAL TESTS:  OUTCOME MEASURES: TEST Outcome Interpretation  5 times sit<>stand 21 seconds >79 yo, >15 sec indicates increased risk for falls  10 meter walk test deferred <1.0 m/s indicates increased risk for falls; limited community ambulator  [employer] Assessment /56   deferred <36/56 (100% risk for falls), 37-45 (80% risk for falls); 46-51 (>50% risk for falls); 52-55 (lower risk <25% of falls)      GAIT: Distance walked: from waiting area into PT gym 50 ft, PT supervision Assistive device utilized: None Level of assistance: Complete Independence Comments: Pt ambulates with decreased gait speed, wide BOS, decreased stride length, excess L hip flexion strategy to compensate for L foot drop  Pt not able to stand SLS on L without UE support today; able to stand R LE x 5  seconds  TODAY'S TREATMENT:  DATE: 10/22/22  Initial evaluation performed  PATIENT EDUCATION:  Education details: PT POC/goals, importance of attending cardiac rehab Person educated: Patient Education method: Explanation Education comprehension: verbalized understanding and needs further education  HOME EXERCISE PROGRAM: To be initiated  ASSESSMENT:  CLINICAL IMPRESSION: Patient is a 77 y.o. M who was seen today for physical therapy evaluation and treatment for decreased LE strength, decreased balance, history of falls, frequent falls, and difficulty walking longer distances because of fear of falling.   OBJECTIVE IMPAIRMENTS: Abnormal gait, cardiopulmonary status limiting activity, decreased activity tolerance, decreased balance, decreased mobility, difficulty walking, decreased ROM, and decreased strength.   ACTIVITY LIMITATIONS: standing, squatting, stairs, transfers, and locomotion level  PARTICIPATION LIMITATIONS: meal prep, cleaning, laundry, interpersonal relationship, shopping, and community activity  PERSONAL FACTORS: Age, Past/current experiences, Time since onset of injury/illness/exacerbation, and cardiovascular hx including recent MI with stent placement last week  are also affecting patient's functional outcome.   REHAB POTENTIAL: Good  CLINICAL DECISION MAKING: Stable/uncomplicated  EVALUATION COMPLEXITY: Low   GOALS: Goals reviewed with patient? Yes  SHORT TERM GOALS: Target date: 11/11/22 Pt will be able to perform a HEP for LE strengthening >3x/week Baseline: Goal status: INITIAL   LONG TERM GOALS: Target date: 12/04/22  Improve FOTO to >51 indicating pt able to perform his daily activities without being limited by his balance Baseline: 39 Goal status: INITIAL  2.  Pt will be able to amb with typical width BOS and b/l heel strike  gait pattern using least restrictive AD and AFO on flat/incline/decline/grass surfaces x 10 min Baseline: not using any AD or AFO in clinic, pt wide BOS, no L heel strike due to foot drop Goal status: INITIAL  3.  Pt will be able to perform 5x STS in <15 sec indicating reduced risk for falling Baseline: 21 sec Goal status: INITIAL  PLAN:  PT FREQUENCY: 2x/week  PT DURATION: 6 weeks  PLANNED INTERVENTIONS: Therapeutic exercises, Therapeutic activity, Neuromuscular re-education, Balance training, Gait training, Patient/Family education, Self Care, Joint mobilization, and Orthotic/Fit training  PLAN FOR NEXT SESSION: 10 m walk test, assess BP and vitals, begin low intensity LE strength/balance  Max Fickle, PT, DPT, OCS   Ardine Bjork, PT 10/23/2022, 10:10 PM

## 2022-10-24 ENCOUNTER — Ambulatory Visit: Payer: Medicare HMO

## 2022-10-24 DIAGNOSIS — R296 Repeated falls: Secondary | ICD-10-CM

## 2022-10-24 DIAGNOSIS — M6281 Muscle weakness (generalized): Secondary | ICD-10-CM

## 2022-10-24 DIAGNOSIS — R2689 Other abnormalities of gait and mobility: Secondary | ICD-10-CM

## 2022-10-24 NOTE — Therapy (Signed)
OUTPATIENT PHYSICAL THERAPY LOWER EXTREMITY TREATMENT   Patient Name: Devin [name] [facility]. MRN: [id number] DOB:[birth year], 77 y.o., male Today's Date: 10/24/2022  END OF SESSION:  PT End of Session - 10/24/22 1205     Visit Number 2    Number of Visits 10    Date for PT Re-Evaluation 12/04/22    Authorization Type 2x/week x 6 weeks (re-cert/PN 09/08/50)    PT Start Time 1200    PT Stop Time 1245    PT Time Calculation (min) 45 min    Activity Tolerance Patient tolerated treatment well    Behavior During Therapy WFL for tasks assessed/performed             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   Hypertension    Ischemic cardiomyopathy    MI, old    Peripheral neuropathy    PFO (patent foramen ovale) 05/2019   s/p repair   Sleep apnea    Spinal stenosis of lumbar region    TIA (transient ischemic attack)    Past Surgical History:  Procedure Laterality Date   APPENDECTOMY     BREAST SURGERY Right    benign mass   CARDIOVERSION Right 12/15/2019   Procedure: CARDIOVERSION;  Surgeon: Laurier Nancy, MD;  Location: [facility] ORS;  Service: Cardiovascular;  Laterality: Right;   CATARACT EXTRACTION, BILATERAL     COLONOSCOPY     CORONARY ANGIOPLASTY WITH STENT PLACEMENT     CORONARY ARTERY BYPASS GRAFT  05/2019   LIMA-LAD   CORONARY STENT INTERVENTION N/A 10/15/2022   Procedure: CORONARY STENT INTERVENTION;  Surgeon: Marcina Millard, MD;  Location: [facility] IN[facility]IVE [facility];  Service: Cardiovascular;  Laterality: N/A;   LEFT HEART CATH N/A 10/15/2022   Procedure: Left Heart Cath;  Surgeon: Marcina Millard, MD;   Location: [facility] IN[facility] CV [facility];  Service: Cardiovascular;  Laterality: N/A;   LEFT HEART CATH AND CORS/GRAFTS ANGIOGRAPHY N/A 05/21/2019   Procedure: LEFT HEART CATH AND CORONARY ANGIOGRAPHY;  Surgeon: Laurier Nancy, MD;  Location: [facility] IN[facility] CV [facility];  Service: Cardiovascular;  Laterality: N/A;   LUMBAR LAMINECTOMY/DECOMPRESSION MICRODISCECTOMY N/A 07/04/2020   Procedure: L4-5 LAMINECTOMY;  Surgeon: Lucy Chris, MD;  Location: [facility] OR[facility];  Service: Neurosurgery;  Laterality: N/A;   REPLACEMENT TOTAL KNEE BILATERAL     Patient Active Problem List   Diagnosis Date Noted   NSTEMI (non-ST elevated myocardial infarction) (HCC) 10/13/2022   HFrEF (heart failure with reduced ejection fraction) (HCC) 10/13/2022   Degenerative joint disease (DJD) of lumbar spine 10/12/2022   Frequent falls 05/15/2022   Unsteady gait 04/16/2022   Acute delirium 04/13/2022   Electrolyte abnormality 04/13/2022   Nausea & vomiting 04/13/2022   PAD (peripheral artery disease) (HCC) 09/03/2021   Localized, primary osteoarthritis 11/24/2020   Wound healing, delayed 09/08/2020   History of lumbar surgery 09/08/2020   Chronic ischemic heart disease 07/13/2020   History of MI (myocardial infarction) 07/13/2020   History of PTCA 1 07/13/2020   Other personal history presenting hazards to health 07/13/2020   Pure hypercholesterolemia 07/13/2020  Elevated lactic acid level 04/19/2020   Generalized weakness 04/09/2020   Fall at home 04/09/2020   Gastroenteritis 04/09/2020   AMS (altered mental status) 04/08/2020   Hypoxia 02/01/2020   Atrial fibrillation status post cardioversion (HCC) 12/22/2019   Congestive heart failure (CHF) (HCC) 12/13/2019   Hyponatremia 12/06/2019   Acute postoperative anemia due to expected blood loss 05/28/2019   Hyperlipidemia 05/28/2019   Ischemic cardiomyopathy 05/28/2019   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   Unstable angina (HCC) 05/24/2019   Chest pain 05/21/2019    Ischemic chest pain (HCC) 05/16/2019   CAD (coronary artery disease) 05/16/2019   HTN (hypertension) 05/16/2019   Depression 05/16/2019   GERD (gastroesophageal reflux disease) 05/16/2019   Verruca plantaris 01/23/2018   Obstructive sleep apnea syndrome 01/20/2018   Major depressive disorder, single episode, moderate (HCC) 07/05/2016   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 Zada Finders, MD  REFERRING PROVIDER: Dr. Zada Finders, MD/ [name], [facility]  REFERRING DIAG:  R26.9 (ICD-10-CM) - Unspecified abnormalities of gait and mobility  R26.89 (ICD-10-CM) - Other abnormalities of gait and mobility  R29.6 (ICD-10-CM) - Repeated falls    THERAPY DIAG:  Muscle weakness (generalized)  Other abnormalities of gait and mobility  Balance problem  Repeated falls  Rationale for Evaluation and Treatment: Rehabilitation  ONSET DATE: at least 2 years  SUBJECTIVE: (From initial evaluation note)  SUBJECTIVE STATEMENT: Pt reports he is having difficulty with gait and balance.  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   He feels like his balance and walking difficulty have worsened recently.  He reports he started falling frequently, he was catching his foot on things and tripping.  The most recent major fall was about 6 months ago.  He is being careful since then and trying to walk more to get stronger and trying to be more aware of his gait pattern.  He was walking with a walker until ~4 months ago, then started using a cane, and then has tried walking without any assistance.      He feels like his difficulty walking limits his  ability to get out of the house as much as he used to because he is fearful of falling.  PLOF- he enjoys getting out of the house for social activities, going out to breakfast too; going grocery shopping a few times/week to get his groceries; prior to his back surgery he walked at the park in [place], hasn't been back though because he is worried about falling though.  He has a home aide that comes a few days a week to assist with household tasks as needed.  Also reports he had a MI a little over a week ago and was in the hospital (8/3-10/16/22)- he states he is referred to begin cardiac rehab as part of his outpatient recovery.  PERTINENT HISTORY: Extensive cardiac history History of lumbar spine surgery- 2022 L4-5 microdiscectomy/laminectomy   PAIN:  Are you having pain? R groin muscle area where he had the cardiac catheterization procedure last week.     PRECAUTIONS: Fall  RED FLAGS: Pt with recent MI (10/13/22) with stent placement (see chart review)  WEIGHT BEARING RESTRICTIONS: No  FALLS:  Has patient fallen in last 6 months? Yes. Number of falls multiple  LIVING ENVIRONMENT: Lives with: lives alone (has a self reported "lady friend" for social support and enjoys being able to get out the house for community activities, going to restaurants together) Lives in: House/apartment Stairs:  he lives in a single level home with bonus room over the garage, but he does not need to go upstairs.1 step to enter in the garage- able to get in without falling. Has following equipment at home: Single point cane, Walker - 4 wheeled, and Wheelchair (manual) Has a home health aide that comes 2-3x/week if he needs assistance  OCCUPATION: retired  PLOF: Independent  PATIENT GOALS: to address drop foot and be able to manage it better; to strengthen his legs; and to increase his breathing; and improve his balance   NEXT MD VISIT: yes, next week   OBJECTIVE:   DIAGNOSTIC FINDINGS: no recent lower  extremity/spine imaging  PATIENT SURVEYS:  FOTO 39/52  COGNITION: Overall cognitive status: Within functional limits for tasks assessed     SENSATION: Pt reports reduced sensation to light touch L L4-5 dermatomes  EDEMA:  No pitting edema noted on dorsum of foot   POSTURE: pt stands with wide base of support for balance; uses UE support on LE's for sit to stand transfer  PALPATION: (-) TTP distal LE b/l  LOWER EXTREMITY ROM:  Active ROM Right eval Left eval  Hip flexion WNL WNL  Hip extension    Hip abduction    Hip adduction    Hip internal rotation    Hip external rotation    Knee flexion 105 105  Knee extension -5 -5  Ankle dorsiflexion 5 deg PROM 0 deg  Ankle plantarflexion    Great toe extension 45 deg PROM 40 deg   Ankle eversion     (Blank rows = not tested)  LOWER EXTREMITY MMT:  MMT Right eval Left eval  Hip flexion 4 4  Hip extension    Hip abduction    Hip adduction    Hip internal rotation    Hip external rotation    Knee flexion 5 5  Knee extension 4 4  Ankle dorsiflexion 4/5 1/5  Ankle plantarflexion 3/5 3/5  Great toe extension 4/5 2/5  Ankle eversion 4/5 3/5   (Blank rows = not tested)   FUNCTIONAL TESTS:  OUTCOME MEASURES: TEST Outcome Interpretation  5 times sit<>stand 21 seconds >60 yo, >15 sec indicates increased risk for falls  10 meter walk test deferred <1.0 m/s indicates increased risk for falls; limited community ambulator  [employer] Assessment /56   deferred <36/56 (100% risk for falls), 37-45 (80% risk for falls); 46-51 (>50% risk for falls); 52-55 (lower risk <25% of falls)      GAIT: Distance walked: from waiting area into PT gym 50 ft, PT supervision Assistive device utilized: None Level of assistance: Complete Independence Comments: Pt ambulates with decreased gait speed, wide BOS, decreased stride length, excess L hip flexion strategy to compensate for L foot drop  Pt not able to stand SLS on L without UE  support today; able to stand R LE x 5 seconds  TODAY'S TREATMENT:  DATE: 10/24/22  Subjective: Pt reports he was a little fatigued after last session.  He has not fallen since last session.  He feels like he is very careful and has developed some compensations when he walks because of being concerned about falling.  He has not started cardiac rehab yet.  Pain: 0/10  Objective: Vitals upon arrival SPO2: 98% HR: 49 BP: 118/64  Therapeutic Exercises: Seated marches: 3# 2x10  Seated knee extension: 3# 2x10  Seated heel raises: 3x15 (6# on thighs)  Tilt board: A/P b/l LE x20, L LE only x15, R LE x10  Hurdles: forward direction in // bars (PT supervision with gait belt) 6 laps, focused on hip flexion strategy for foot clearance; attempted cue for "lift toes" but pt not able to actively perform active DF of ankle against gravity  HR 54 after exercise, SPO2 98%  HEP initiation- see below  Discussed benefit/purpose of AFO; he states he has one at home, has not been wearing it bc it bothers his L knee when he wears it.  Asked him to bring to a future session to assess fit and gait while wearing it  PATIENT EDUCATION:  Education details: PT POC/goals, importance of attending cardiac rehab Person educated: Patient Education method: Explanation Education comprehension: verbalized understanding and needs further education  HOME EXERCISE PROGRAM: Access Code: [email or web address] URL: [email or web address] Date: 10/24/2022 Prepared by: Max Fickle  Exercises - Seated Heel Raise  - 1 x daily - 3 x weekly - 2 sets - 20 reps - Seated March  - 1 x daily - 3 x weekly - 2 sets - 10 reps - Seated Long Arc Quad  - 1 x daily - 3 x weekly - 2 sets - 10 reps  ASSESSMENT:  CLINICAL IMPRESSION: Initiated very gentle therapeutic exercises today for LE ROM/strength and  gait during session while monitoring vitals.  Kept pt @ RPE level of <2-3/10 for all exercises.  Pt's L ankle DF weakness noticed during hurdle step overs today; requires a hip flexion strategy to clear L toes.  Discussed benefit of AFO, and pt states he has one at home but no wearing as it is not comfortable for his L knee.  Asked him to bring with him to future appointment to assess fit and gait together.  Overall he is an appropriate candidate for skilled PT to address decreased balance, history of falls, frequent falls, and difficulty walking longer distances because of fear of falling.  OBJECTIVE IMPAIRMENTS: Abnormal gait, cardiopulmonary status limiting activity, decreased activity tolerance, decreased balance, decreased mobility, difficulty walking, decreased ROM, and decreased strength.   ACTIVITY LIMITATIONS: standing, squatting, stairs, transfers, and locomotion level  PARTICIPATION LIMITATIONS: meal prep, cleaning, laundry, interpersonal relationship, shopping, and community activity  PERSONAL FACTORS: Age, Past/current experiences, Time since onset of injury/illness/exacerbation, and cardiovascular hx including recent MI with stent placement last week  are also affecting patient's functional outcome.   REHAB POTENTIAL: Good  CLINICAL DECISION MAKING: Stable/uncomplicated  EVALUATION COMPLEXITY: Low   GOALS: Goals reviewed with patient? Yes  SHORT TERM GOALS: Target date: 11/11/22 Pt will be able to perform a HEP for LE strengthening >3x/week Baseline: Goal status: INITIAL   LONG TERM GOALS: Target date: 12/04/22  Improve FOTO to >51 indicating pt able to perform his daily activities without being limited by his balance Baseline: 39 Goal status: INITIAL  2.  Pt will be able to amb with typical width BOS and b/l heel strike gait pattern using least restrictive AD and AFO  on flat/incline/decline/grass surfaces x 10 min Baseline: not using any AD or AFO in clinic, pt wide BOS,  no L heel strike due to foot drop Goal status: INITIAL  3.  Pt will be able to perform 5x STS in <15 sec indicating reduced risk for falling Baseline: 21 sec Goal status: INITIAL  PLAN:  PT FREQUENCY: 2x/week  PT DURATION: 6 weeks  PLANNED INTERVENTIONS: Therapeutic exercises, Therapeutic activity, Neuromuscular re-education, Balance training, Gait training, Patient/Family education, Self Care, Joint mobilization, and Orthotic/Fit training  PLAN FOR NEXT SESSION: 10 m walk test, assess BP and vitals, continue low intensity LE strength/balance, f/u to see if he contacted cardiac rehab to begin  Max Fickle, PT, DPT, OCS   Ardine Bjork, PT 10/24/2022, 4:06 PM

## 2022-10-29 ENCOUNTER — Ambulatory Visit: Payer: Medicare HMO

## 2022-10-29 DIAGNOSIS — M6281 Muscle weakness (generalized): Secondary | ICD-10-CM | POA: Diagnosis not present

## 2022-10-29 DIAGNOSIS — R2689 Other abnormalities of gait and mobility: Secondary | ICD-10-CM

## 2022-10-29 NOTE — Therapy (Signed)
OUTPATIENT PHYSICAL THERAPY LOWER EXTREMITY TREATMENT   Patient Name: Devin [name] [facility]. MRN: [id number] DOB:Dec 01, 1945, 77 y.o., male Today's Date: 10/29/2022  END OF SESSION:  PT End of Session - 10/29/22 1202     Visit Number 3    Number of Visits 10    Date for PT Re-Evaluation 12/04/22    Authorization Type 2x/week x 6 weeks (re-cert/PN 0/27/25)    PT Start Time 1200    PT Stop Time 1245    PT Time Calculation (min) 45 min    Activity Tolerance Patient tolerated treatment well    Behavior During Therapy WFL for tasks assessed/performed             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   Hypertension    Ischemic cardiomyopathy    MI, old    Peripheral neuropathy    PFO (patent foramen ovale) 05/2019   s/p repair   Sleep apnea    Spinal stenosis of lumbar region    TIA (transient ischemic attack)    Past Surgical History:  Procedure Laterality Date   APPENDECTOMY     BREAST SURGERY Right    benign mass   CARDIOVERSION Right 12/15/2019   Procedure: CARDIOVERSION;  Surgeon: Laurier Nancy, MD;  Location: [facility] ORS;  Service: Cardiovascular;  Laterality: Right;   CATARACT EXTRACTION, BILATERAL     COLONOSCOPY     CORONARY ANGIOPLASTY WITH STENT PLACEMENT     CORONARY ARTERY BYPASS GRAFT  05/2019   LIMA-LAD   CORONARY STENT INTERVENTION N/A 10/15/2022   Procedure: CORONARY STENT INTERVENTION;  Surgeon: Marcina Millard, MD;  Location: [facility] INVASIVE CV [facility];  Service: Cardiovascular;  Laterality: N/A;   LEFT HEART CATH N/A 10/15/2022   Procedure: Left Heart Cath;  Surgeon: Marcina Millard, MD;   Location: [facility] INV[facility] CV [facility];  Service: Cardiovascular;  Laterality: N/A;   LEFT HEART CATH AND CORS/GRAFTS ANGIOGRAPHY N/A 05/21/2019   Procedure: LEFT HEART CATH AND CORONARY ANGIOGRAPHY;  Surgeon: Laurier Nancy, MD;  Location: [facility] INV[facility] CV [facility];  Service: Cardiovascular;  Laterality: N/A;   LUMBAR LAMINECTOMY/DECOMPRESSION MICRODISCECTOMY N/A 07/04/2020   Procedure: L4-5 LAMINECTOMY;  Surgeon: Lucy Chris, MD;  Location: [facility] ORS;  Service: Neurosurgery;  Laterality: N/A;   REPLACEMENT TOTAL KNEE BILATERAL     Patient Active Problem List   Diagnosis Date Noted   NSTEMI (non-ST elevated myocardial infarction) (HCC) 10/13/2022   HFrEF (heart failure with reduced ejection fraction) (HCC) 10/13/2022   Degenerative joint disease (DJD) of lumbar spine 10/12/2022   Frequent falls 05/15/2022   Unsteady gait 04/16/2022   Acute delirium 04/13/2022   Electrolyte abnormality 04/13/2022   Nausea & vomiting 04/13/2022   PAD (peripheral artery disease) (HCC) 09/03/2021   Localized, primary osteoarthritis 11/24/2020   Wound healing, delayed 09/08/2020   History of lumbar surgery 09/08/2020   Chronic ischemic heart disease 07/13/2020   History of MI (myocardial infarction) 07/13/2020   History of PTCA 1 07/13/2020   Other personal history presenting hazards to health 07/13/2020   Pure hypercholesterolemia 07/13/2020  Elevated lactic acid level 04/19/2020   Generalized weakness 04/09/2020   Fall at home 04/09/2020   Gastroenteritis 04/09/2020   AMS (altered mental status) 04/08/2020   Hypoxia 02/01/2020   Atrial fibrillation status post cardioversion (HCC) 12/22/2019   Congestive heart failure (CHF) (HCC) 12/13/2019   Hyponatremia 12/06/2019   Acute postoperative anemia due to expected blood loss 05/28/2019   Hyperlipidemia 05/28/2019   Ischemic cardiomyopathy 05/28/2019   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   Unstable angina (HCC) 05/24/2019   Chest pain 05/21/2019    Ischemic chest pain (HCC) 05/16/2019   CAD (coronary artery disease) 05/16/2019   HTN (hypertension) 05/16/2019   Depression 05/16/2019   GERD (gastroesophageal reflux disease) 05/16/2019   Verruca plantaris 01/23/2018   Obstructive sleep apnea syndrome 01/20/2018   Major depressive disorder, single episode, moderate (HCC) 07/05/2016   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 Zada Finders, MD  REFERRING PROVIDER: Dr. Zada Finders, MD/ [name], [facility]  REFERRING DIAG:  R26.9 (ICD-10-CM) - Unspecified abnormalities of gait and mobility  R26.89 (ICD-10-CM) - Other abnormalities of gait and mobility  R29.6 (ICD-10-CM) - Repeated falls    THERAPY DIAG:  Muscle weakness (generalized)  Other abnormalities of gait and mobility  Rationale for Evaluation and Treatment: Rehabilitation  ONSET DATE: at least 2 years  SUBJECTIVE: (From initial evaluation note)  SUBJECTIVE STATEMENT: Pt reports he is having difficulty with gait and balance.  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   He feels like his balance and walking difficulty have worsened recently.  He reports he started falling frequently, he was catching his foot on things and tripping.  The most recent major fall was about 6 months ago.  He is being careful since then and trying to walk more to get stronger and trying to be more aware of his gait pattern.  He was walking with a walker until ~4 months ago, then started using a cane, and then has tried walking without any assistance.      He feels like his difficulty walking limits his ability to get out of the house as much  as he used to because he is fearful of falling.  PLOF- he enjoys getting out of the house for social activities, going out to breakfast too; going grocery shopping a few times/week to get his groceries; prior to his back surgery he walked at the park in [place], hasn't been back though because he is worried about falling though.  He has a home aide that comes a few days a week to assist with household tasks as needed.  Also reports he had a MI a little over a week ago and was in the hospital (8/3-10/16/22)- he states he is referred to begin cardiac rehab as part of his outpatient recovery.  PERTINENT HISTORY: Extensive cardiac history History of lumbar spine surgery- 2022 L4-5 microdiscectomy/laminectomy   PAIN:  Are you having pain? R groin muscle area where he had the cardiac catheterization procedure last week.     PRECAUTIONS: Fall  RED FLAGS: Pt with recent MI (10/13/22) with stent placement (see chart review)  WEIGHT BEARING RESTRICTIONS: No  FALLS:  Has patient fallen in last 6 months? Yes. Number of falls multiple  LIVING ENVIRONMENT: Lives with: lives alone (has a self reported "lady friend" for social support and enjoys being able to get out the house for community activities, going to restaurants together) Lives in: House/apartment Stairs:  he lives in a single level home with bonus room over the garage, but he does not need to go upstairs.1 step to enter in the garage- able to get in without falling. Has following equipment at home: Single point cane, Walker - 4 wheeled, and Wheelchair (manual) Has a home health aide that comes 2-3x/week if he needs assistance  OCCUPATION: retired  PLOF: Independent  PATIENT GOALS: to address drop foot and be able to manage it better; to strengthen his legs; and to increase his breathing; and improve his balance   NEXT MD VISIT: yes, next week   OBJECTIVE:   DIAGNOSTIC FINDINGS: no recent lower extremity/spine imaging  PATIENT SURVEYS:   FOTO 39/52  COGNITION: Overall cognitive status: Within functional limits for tasks assessed     SENSATION: Pt reports reduced sensation to light touch L L4-5 dermatomes  EDEMA:  No pitting edema noted on dorsum of foot   POSTURE: pt stands with wide base of support for balance; uses UE support on LE's for sit to stand transfer  PALPATION: (-) TTP distal LE b/l  LOWER EXTREMITY ROM:  Active ROM Right eval Left eval  Hip flexion WNL WNL  Hip extension    Hip abduction    Hip adduction    Hip internal rotation    Hip external rotation    Knee flexion 105 105  Knee extension -5 -5  Ankle dorsiflexion 5 deg PROM 0 deg  Ankle plantarflexion    Great toe extension 45 deg PROM 40 deg   Ankle eversion     (Blank rows = not tested)  LOWER EXTREMITY MMT:  MMT Right eval Left eval  Hip flexion 4 4  Hip extension    Hip abduction    Hip adduction    Hip internal rotation    Hip external rotation    Knee flexion 5 5  Knee extension 4 4  Ankle dorsiflexion 4/5 1/5  Ankle plantarflexion 3/5 3/5  Great toe extension 4/5 2/5  Ankle eversion 4/5 3/5   (Blank rows = not tested)   FUNCTIONAL TESTS:  OUTCOME MEASURES: TEST Outcome Interpretation  5 times sit<>stand 21 seconds >60 yo, >15 sec indicates increased risk for falls  10 meter walk test deferred <1.0 m/s indicates increased risk for falls; limited community ambulator  [employer] Assessment /56   deferred <36/56 (100% risk for falls), 37-45 (80% risk for falls); 46-51 (>50% risk for falls); 52-55 (lower risk <25% of falls)      GAIT: Distance walked: from waiting area into PT gym 50 ft, PT supervision Assistive device utilized: None Level of assistance: Complete Independence Comments: Pt ambulates with decreased gait speed, wide BOS, decreased stride length, excess L hip flexion strategy to compensate for L foot drop  Pt not able to stand SLS on L without UE support today; able to stand R LE x 5  seconds  TODAY'S TREATMENT:  DATE: 10/29/22  Subjective: Pt reports he went to [facility] ER this weekend bc he was having chest pain; everything came back clear.  He feels better and more confident today after getting things checked.  He worked on his HEP and he feels like he is walking a little better.  He has not started cardiac rehab yet- will call this week.  He feels like his balance may be a little better; did not have any falls.  Pain: 0/10  Objective: Vitals upon arrival SPO2: 98% HR: 55 BP: 129/71 upon arrival in sitting   Therapeutic Exercises: Seated marches: 3# 2x10  Seated knee extension: 3# 2x10  Seated heel raises: 3x15 (10# on thighs)  Seated toe raises with PT tactile cues on L ant tib, and manual assist: 2x10, palpable mm activation, not able to independently DF through full AROM.    Tilt board: A/P b/l LE x20, L LE only 2x15, R LE x10  Hurdles: forward direction in // bars (PT supervision with gait belt) 6 laps, focused on hip flexion strategy for foot clearance; attempted cue for "lift toes" but pt not able to actively perform active DF of ankle against gravity  Front step ups: 6 inch height, x5 R LE, x5 L LE with finger tip support on railing  Alternating toe taps: x10 R and L on 6 inch step with finger tip support on railing  HR 60 after exercise, SPO2 98%  Pt ed for RPE scale, purpose of RPE to keep at light activity level while waiting to begin cardiac rehab  Discussed benefit/purpose of AFO; he states he has one at home, has not been wearing it bc it bothers his L knee when he wears it.  Asked him to bring to a future session to assess fit and gait while wearing it  PATIENT EDUCATION:  Education details: PT POC/goals, importance of attending cardiac rehab Person educated: Patient Education method: Explanation Education comprehension:  verbalized understanding and needs further education  HOME EXERCISE PROGRAM: Access Code: [email or web address] URL: [email or web address] Date: 10/24/2022 Prepared by: Max Fickle  Exercises - Seated Heel Raise  - 1 x daily - 3 x weekly - 2 sets - 20 reps - Seated March  - 1 x daily - 3 x weekly - 2 sets - 10 reps - Seated Long Arc Quad  - 1 x daily - 3 x weekly - 2 sets - 10 reps  ASSESSMENT:  CLINICAL IMPRESSION: Continued with therapeutic exercises today to promote LE strength, ankle strength, and safe gait strategies utilizing hip flexion strategy to compensate for drop foot on L.  Pt had no loss of balance.  Overall he fatigues quickly.  Vital signs monitored throughout session and pt's RPE kept <3/10.  Asked him to bring with him to future appointment to assess fit and gait together.  Overall he is an appropriate candidate for skilled PT to address decreased balance, history of falls, frequent falls, and difficulty walking longer distances because of fear of falling.  OBJECTIVE IMPAIRMENTS: Abnormal gait, cardiopulmonary status limiting activity, decreased activity tolerance, decreased balance, decreased mobility, difficulty walking, decreased ROM, and decreased strength.   ACTIVITY LIMITATIONS: standing, squatting, stairs, transfers, and locomotion level  PARTICIPATION LIMITATIONS: meal prep, cleaning, laundry, interpersonal relationship, shopping, and community activity  PERSONAL FACTORS: Age, Past/current experiences, Time since onset of injury/illness/exacerbation, and cardiovascular hx including recent MI with stent placement last week  are also affecting patient's functional outcome.   REHAB POTENTIAL: Good  CLINICAL DECISION MAKING: Stable/uncomplicated  EVALUATION  COMPLEXITY: Low   GOALS: Goals reviewed with patient? Yes  SHORT TERM GOALS: Target date: 11/11/22 Pt will be able to perform a HEP for LE strengthening >3x/week Baseline: Goal status:  INITIAL   LONG TERM GOALS: Target date: 12/04/22  Improve FOTO to >51 indicating pt able to perform his daily activities without being limited by his balance Baseline: 39 Goal status: INITIAL  2.  Pt will be able to amb with typical width BOS and b/l heel strike gait pattern using least restrictive AD and AFO on flat/incline/decline/grass surfaces x 10 min Baseline: not using any AD or AFO in clinic, pt wide BOS, no L heel strike due to foot drop Goal status: INITIAL  3.  Pt will be able to perform 5x STS in <15 sec indicating reduced risk for falling Baseline: 21 sec Goal status: INITIAL  PLAN:  PT FREQUENCY: 2x/week  PT DURATION: 6 weeks  PLANNED INTERVENTIONS: Therapeutic exercises, Therapeutic activity, Neuromuscular re-education, Balance training, Gait training, Patient/Family education, Self Care, Joint mobilization, and Orthotic/Fit training  PLAN FOR NEXT SESSION: 10 m walk test, assess BP and vitals, continue low intensity LE strength/balance, f/u to see if he contacted cardiac rehab to begin  Max Fickle, PT, DPT, OCS   Ardine Bjork, PT 10/29/2022, 12:53 PM

## 2022-10-31 ENCOUNTER — Ambulatory Visit: Payer: Medicare HMO

## 2022-10-31 DIAGNOSIS — M6281 Muscle weakness (generalized): Secondary | ICD-10-CM

## 2022-10-31 DIAGNOSIS — R2689 Other abnormalities of gait and mobility: Secondary | ICD-10-CM

## 2022-10-31 NOTE — Therapy (Signed)
OUTPATIENT PHYSICAL THERAPY LOWER EXTREMITY TREATMENT   Patient Name: Devin [name]. MRN: [id number] DOB:August 16, 1945, 77 y.o., male Today's Date: 11/01/2022  END OF SESSION:  PT End of Session - 10/31/22 1222     Visit Number 4    Number of Visits 10    Date for PT Re-Evaluation 12/04/22    Authorization Type 2x/week x 6 weeks (re-cert/PN 0/86/57)    PT Start Time 1210    PT Stop Time 1250    PT Time Calculation (min) 40 min    Activity Tolerance Patient tolerated treatment well    Behavior During Therapy WFL for tasks assessed/performed             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   Hypertension    Ischemic cardiomyopathy    MI, old    Peripheral neuropathy    PFO (patent foramen ovale) 05/2019   s/p repair   Sleep apnea    Spinal stenosis of lumbar region    TIA (transient ischemic attack)    Past Surgical History:  Procedure Laterality Date   APPENDECTOMY     BREAST SURGERY Right    benign mass   CARDIOVERSION Right 12/15/2019   Procedure: CARDIOVERSION;  Surgeon: Laurier Nancy, MD;  Location: [facility];  Service: Cardiovascular;  Laterality: Right;   CATARACT EXTRACTION, BILATERAL     COLONOSCOPY     CORONARY ANGIOPLASTY WITH STENT PLACEMENT     CORONARY ARTERY BYPASS GRAFT  05/2019   LIMA-LAD   CORONARY STENT INTERVENTION N/A 10/15/2022   Procedure: CORONARY STENT INTERVENTION;  Surgeon: Marcina Millard, MD;  Location: [facility];  Service: Cardiovascular;  Laterality: N/A;   LEFT HEART CATH N/A 10/15/2022   Procedure: Left Heart Cath;  Surgeon: Marcina Millard, MD;   Location: [facility];  Service: Cardiovascular;  Laterality: N/A;   LEFT HEART CATH AND CORS/GRAFTS ANGIOGRAPHY N/A 05/21/2019   Procedure: LEFT HEART CATH AND CORONARY ANGIOGRAPHY;  Surgeon: Laurier Nancy, MD;  Location: [facility];  Service: Cardiovascular;  Laterality: N/A;   LUMBAR LAMINECTOMY/DECOMPRESSION MICRODISCECTOMY N/A 07/04/2020   Procedure: L4-5 LAMINECTOMY;  Surgeon: Lucy Chris, MD;  Location: [facility];  Service: Neurosurgery;  Laterality: N/A;   REPLACEMENT TOTAL KNEE BILATERAL     Patient Active Problem List   Diagnosis Date Noted   NSTEMI (non-ST elevated myocardial infarction) (HCC) 10/13/2022   HFrEF (heart failure with reduced ejection fraction) (HCC) 10/13/2022   Degenerative joint disease (DJD) of lumbar spine 10/12/2022   Frequent falls 05/15/2022   Unsteady gait 04/16/2022   Acute delirium 04/13/2022   Electrolyte abnormality 04/13/2022   Nausea & vomiting 04/13/2022   PAD (peripheral artery disease) (HCC) 09/03/2021   Localized, primary osteoarthritis 11/24/2020   Wound healing, delayed 09/08/2020   History of lumbar surgery 09/08/2020   Chronic ischemic heart disease 07/13/2020   History of MI (myocardial infarction) 07/13/2020   History of PTCA 1 07/13/2020   Other personal history presenting hazards to health 07/13/2020   Pure hypercholesterolemia 07/13/2020  Elevated lactic acid level 04/19/2020   Generalized weakness 04/09/2020   Fall at home 04/09/2020   Gastroenteritis 04/09/2020   AMS (altered mental status) 04/08/2020   Hypoxia 02/01/2020   Atrial fibrillation status post cardioversion (HCC) 12/22/2019   Congestive heart failure (CHF) (HCC) 12/13/2019   Hyponatremia 12/06/2019   Acute postoperative anemia due to expected blood loss 05/28/2019   Hyperlipidemia 05/28/2019   Ischemic cardiomyopathy 05/28/2019   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   Unstable angina (HCC) 05/24/2019   Chest pain 05/21/2019    Ischemic chest pain (HCC) 05/16/2019   CAD (coronary artery disease) 05/16/2019   HTN (hypertension) 05/16/2019   Depression 05/16/2019   GERD (gastroesophageal reflux disease) 05/16/2019   Verruca plantaris 01/23/2018   Obstructive sleep apnea syndrome 01/20/2018   Major depressive disorder, single episode, moderate (HCC) 07/05/2016   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 Zada Finders, MD  REFERRING PROVIDER: Dr. Zada Finders, MD/ [name], [facility]  REFERRING DIAG:  R26.9 (ICD-10-CM) - Unspecified abnormalities of gait and mobility  R26.89 (ICD-10-CM) - Other abnormalities of gait and mobility  R29.6 (ICD-10-CM) - Repeated falls    THERAPY DIAG:  Muscle weakness (generalized)  Other abnormalities of gait and mobility  Balance problem  Rationale for Evaluation and Treatment: Rehabilitation  ONSET DATE: at least 2 years  SUBJECTIVE: (From initial evaluation note)  SUBJECTIVE STATEMENT: Pt reports he is having difficulty with gait and balance.  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   He feels like his balance and walking difficulty have worsened recently.  He reports he started falling frequently, he was catching his foot on things and tripping.  The most recent major fall was about 6 months ago.  He is being careful since then and trying to walk more to get stronger and trying to be more aware of his gait pattern.  He was walking with a walker until ~4 months ago, then started using a cane, and then has tried walking without any assistance.      He feels like his difficulty walking limits his ability to get out of  the house as much as he used to because he is fearful of falling.  PLOF- he enjoys getting out of the house for social activities, going out to breakfast too; going grocery shopping a few times/week to get his groceries; prior to his back surgery he walked at the park in [place], hasn't been back though because he is worried about falling though.  He has a home aide that comes a few days a week to assist with household tasks as needed.  Also reports he had a MI a little over a week ago and was in the hospital (8/3-10/16/22)- he states he is referred to begin cardiac rehab as part of his outpatient recovery.  PERTINENT HISTORY: Extensive cardiac history History of lumbar spine surgery- 2022 L4-5 microdiscectomy/laminectomy   PAIN:  Are you having pain? R groin muscle area where he had the cardiac catheterization procedure last week.     PRECAUTIONS: Fall  RED FLAGS: Pt with recent MI (10/13/22) with stent placement (see chart review)  WEIGHT BEARING RESTRICTIONS:  No  FALLS:  Has patient fallen in last 6 months? Yes. Number of falls multiple  LIVING ENVIRONMENT: Lives with: lives alone (has a self reported "lady friend" for social support and enjoys being able to get out the house for community activities, going to restaurants together) Lives in: House/apartment Stairs:  he lives in a single level home with bonus room over the garage, but he does not need to go upstairs.1 step to enter in the garage- able to get in without falling. Has following equipment at home: Single point cane, Walker - 4 wheeled, and Wheelchair (manual) Has a home health aide that comes 2-3x/week if he needs assistance  OCCUPATION: retired  PLOF: Independent  PATIENT GOALS: to address drop foot and be able to manage it better; to strengthen his legs; and to increase his breathing; and improve his balance   NEXT MD VISIT: yes, next week   OBJECTIVE:   DIAGNOSTIC FINDINGS: no recent lower extremity/spine  imaging  PATIENT SURVEYS:  FOTO 39/52  COGNITION: Overall cognitive status: Within functional limits for tasks assessed     SENSATION: Pt reports reduced sensation to light touch L L4-5 dermatomes  EDEMA:  No pitting edema noted on dorsum of foot   POSTURE: pt stands with wide base of support for balance; uses UE support on LE's for sit to stand transfer  PALPATION: (-) TTP distal LE b/l  LOWER EXTREMITY ROM:  Active ROM Right eval Left eval  Hip flexion WNL WNL  Hip extension    Hip abduction    Hip adduction    Hip internal rotation    Hip external rotation    Knee flexion 105 105  Knee extension -5 -5  Ankle dorsiflexion 5 deg PROM 0 deg  Ankle plantarflexion    Great toe extension 45 deg PROM 40 deg   Ankle eversion     (Blank rows = not tested)  LOWER EXTREMITY MMT:  MMT Right eval Left eval  Hip flexion 4 4  Hip extension    Hip abduction    Hip adduction    Hip internal rotation    Hip external rotation    Knee flexion 5 5  Knee extension 4 4  Ankle dorsiflexion 4/5 1/5  Ankle plantarflexion 3/5 3/5  Great toe extension 4/5 2/5  Ankle eversion 4/5 3/5   (Blank rows = not tested)   FUNCTIONAL TESTS:  OUTCOME MEASURES: TEST Outcome Interpretation  5 times sit<>stand 21 seconds >60 yo, >15 sec indicates increased risk for falls  10 meter walk test deferred <1.0 m/s indicates increased risk for falls; limited community ambulator  [employer] Assessment /56   deferred <36/56 (100% risk for falls), 37-45 (80% risk for falls); 46-51 (>50% risk for falls); 52-55 (lower risk <25% of falls)      GAIT: Distance walked: from waiting area into PT gym 50 ft, PT supervision Assistive device utilized: None Level of assistance: Complete Independence Comments: Pt ambulates with decreased gait speed, wide BOS, decreased stride length, excess L hip flexion strategy to compensate for L foot drop  Pt not able to stand SLS on L without UE support today;  able to stand R LE x 5 seconds  TODAY'S TREATMENT:  DATE: 8/122/24  Subjective: Pt reports he is feeling pretty good today.  No falls or major losses of balance since last PT session.  Pain: 0/10  Objective: Vitals upon arrival SPO2: 98% HR: 53 sitting  Therapeutic Exercises: Seated marches: 3# 2x10- not today  Seated knee extension: 3# 2x10- not today  Seated heel raises: 3x15 (10# on thighs)  Seated toe raises with PT tactile cues on L ant tib, and manual assist: 2x10, palpable mm activation, not able to independently DF through full AROM.    Tilt board: A/P b/l LE x20, L LE only 2x15, R LE x10  Hurdles: forward direction in // bars (PT supervision with gait belt) 6 laps, focused on hip flexion strategy for foot clearance; attempted cue for "lift toes" but pt not able to actively perform active DF of ankle against gravity  Side steps in // bars: 3 laps  Front step up onto airex: x15 leading with R LE and x15 leading with L LE, b/l UE support  Airex: feet together, 3 trials x 30-40 seconds with removing hand support  Airex: tandem stance, 3 trials each R in front/L in front x 20-30 seconds removing hand support (pt notes ankle/lower leg fatigue after this)  Front step ups: 6 inch height, x5 R LE, x5 L LE with finger tip support on railing- not today  Alternating toe taps: x10 R and L on 6 inch step with finger tip support on railing- not today  HR 55 after exercise, SPO2 99%  Pt ed for RPE scale, purpose of RPE to keep at light activity level while waiting to begin cardiac rehab  Amb with PT out to his car after session due to fatgue; he utilizes a wide base of support and hip flexion strategy due to L drop foot.  Pt has  SPC- he showed it to me, I asked him to bring it with him to next PT session to work on proper fitting and gait training with it  for improved safety while ambulating on uneven surfaces or longer distances.  Not today-Discussed benefit/purpose of AFO; he states he has one at home, has not been wearing it bc it bothers his L knee when he wears it.  Asked him to bring to a future session to assess fit and gait while wearing it  PATIENT EDUCATION:  Education details: PT POC/goals, importance of attending cardiac rehab Person educated: Patient Education method: Explanation Education comprehension: verbalized understanding and needs further education  HOME EXERCISE PROGRAM: Access Code: [email or web address] URL: [email or web address] Date: 10/24/2022 Prepared by: Max Fickle  Exercises - Seated Heel Raise  - 1 x daily - 3 x weekly - 2 sets - 20 reps - Seated March  - 1 x daily - 3 x weekly - 2 sets - 10 reps - Seated Long Arc Quad  - 1 x daily - 3 x weekly - 2 sets - 10 reps  ASSESSMENT:  CLINICAL IMPRESSION: Continued with therapeutic exercises today to promote LE strength, ankle strength, balance, and safe gait strategies utilizing hip flexion strategy to compensate for drop foot on L.  Pt was very challenged with exercises on the airex; he notes distal LE and ankle mm fatigue after this and required a seated rest beak. Vital signs monitored throughout session and pt's RPE kept <3/10.  Asked him to bring his [facility] and also AFO to future appointment to assess fit and gait during PT appointment.  Overall he is an appropriate candidate for skilled PT to address  decreased balance, history of falls, frequent falls, and difficulty walking longer distances because of fear of falling.  OBJECTIVE IMPAIRMENTS: Abnormal gait, cardiopulmonary status limiting activity, decreased activity tolerance, decreased balance, decreased mobility, difficulty walking, decreased ROM, and decreased strength.   ACTIVITY LIMITATIONS: standing, squatting, stairs, transfers, and locomotion level  PARTICIPATION LIMITATIONS: meal prep, cleaning,  laundry, interpersonal relationship, shopping, and community activity  PERSONAL FACTORS: Age, Past/current experiences, Time since onset of injury/illness/exacerbation, and cardiovascular hx including recent MI with stent placement last week  are also affecting patient's functional outcome.   REHAB POTENTIAL: Good  CLINICAL DECISION MAKING: Stable/uncomplicated  EVALUATION COMPLEXITY: Low   GOALS: Goals reviewed with patient? Yes  SHORT TERM GOALS: Target date: 11/11/22 Pt will be able to perform a HEP for LE strengthening >3x/week Baseline: Goal status: INITIAL   LONG TERM GOALS: Target date: 12/04/22  Improve FOTO to >51 indicating pt able to perform his daily activities without being limited by his balance Baseline: 39 Goal status: INITIAL  2.  Pt will be able to amb with typical width BOS and b/l heel strike gait pattern using least restrictive AD and AFO on flat/incline/decline/grass surfaces x 10 min Baseline: not using any AD or AFO in clinic, pt wide BOS, no L heel strike due to foot drop Goal status: INITIAL  3.  Pt will be able to perform 5x STS in <15 sec indicating reduced risk for falling Baseline: 21 sec Goal status: INITIAL  PLAN:  PT FREQUENCY: 2x/week  PT DURATION: 6 weeks  PLANNED INTERVENTIONS: Therapeutic exercises, Therapeutic activity, Neuromuscular re-education, Balance training, Gait training, Patient/Family education, Self Care, Joint mobilization, and Orthotic/Fit training  PLAN FOR NEXT SESSION: 10 m walk test, assess BP and vitals, continue low intensity LE strength/balance, f/u to see if he contacted cardiac rehab to begin  Max Fickle, PT, DPT, OCS   Ardine Bjork, PT 11/01/2022, 2:01 PM

## 2022-11-05 ENCOUNTER — Ambulatory Visit: Payer: Medicare HMO

## 2022-11-05 DIAGNOSIS — M6281 Muscle weakness (generalized): Secondary | ICD-10-CM

## 2022-11-05 DIAGNOSIS — R296 Repeated falls: Secondary | ICD-10-CM

## 2022-11-05 DIAGNOSIS — R2689 Other abnormalities of gait and mobility: Secondary | ICD-10-CM

## 2022-11-05 NOTE — Therapy (Signed)
OUTPATIENT PHYSICAL THERAPY LOWER EXTREMITY TREATMENT   Patient Name: Devin [name] [facility]. MRN: [id number] DOB:1946/01/30, 77 y.o., male Today's Date: 11/05/2022  END OF SESSION:  PT End of Session - 11/05/22 1358     Visit Number 5    Number of Visits 10    Date for PT Re-Evaluation 12/04/22    Authorization Type 2x/week x 6 weeks (re-cert/PN 06/19/79)    PT Start Time 1400    PT Stop Time 1442    PT Time Calculation (min) 42 min    Activity Tolerance Patient tolerated treatment well    Behavior During Therapy WFL for tasks assessed/performed              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   Hypertension    Ischemic cardiomyopathy    MI, old    Peripheral neuropathy    PFO (patent foramen ovale) 05/2019   s/p repair   Sleep apnea    Spinal stenosis of lumbar region    TIA (transient ischemic attack)    Past Surgical History:  Procedure Laterality Date   APPENDECTOMY     BREAST SURGERY Right    benign mass   CARDIOVERSION Right 12/15/2019   Procedure: CARDIOVERSION;  Surgeon: Laurier Nancy, MD;  Location: [facility] ORS;  Service: Cardiovascular;  Laterality: Right;   CATARACT EXTRACTION, BILATERAL     COLONOSCOPY     CORONARY ANGIOPLASTY WITH STENT PLACEMENT     CORONARY ARTERY BYPASS GRAFT  05/2019   LIMA-LAD   CORONARY STENT INTERVENTION N/A 10/15/2022   Procedure: CORONARY STENT INTERVENTION;  Surgeon: Marcina Millard, MD;  Location: [facility] INVASIVE CV LAB;  Service: Cardiovascular;  Laterality: N/A;   LEFT HEART CATH N/A 10/15/2022   Procedure: Left Heart Cath;  Surgeon: Marcina Millard, MD;   Location: [facility] INVASIVE CV LAB;  Service: Cardiovascular;  Laterality: N/A;   LEFT HEART CATH AND CORS/GRAFTS ANGIOGRAPHY N/A 05/21/2019   Procedure: LEFT HEART CATH AND CORONARY ANGIOGRAPHY;  Surgeon: Laurier Nancy, MD;  Location: [facility] INVASIVE CV LAB;  Service: Cardiovascular;  Laterality: N/A;   LUMBAR LAMINECTOMY/DECOMPRESSION MICRODISCECTOMY N/A 07/04/2020   Procedure: L4-5 LAMINECTOMY;  Surgeon: Lucy Chris, MD;  Location: [facility] ORS;  Service: Neurosurgery;  Laterality: N/A;   REPLACEMENT TOTAL KNEE BILATERAL     Patient Active Problem List   Diagnosis Date Noted   NSTEMI (non-ST elevated myocardial infarction) (HCC) 10/13/2022   HFrEF (heart failure with reduced ejection fraction) (HCC) 10/13/2022   Degenerative joint disease (DJD) of lumbar spine 10/12/2022   Frequent falls 05/15/2022   Unsteady gait 04/16/2022   Acute delirium 04/13/2022   Electrolyte abnormality 04/13/2022   Nausea & vomiting 04/13/2022   PAD (peripheral artery disease) (HCC) 09/03/2021   Localized, primary osteoarthritis 11/24/2020   Wound healing, delayed 09/08/2020   History of lumbar surgery 09/08/2020   Chronic ischemic heart disease 07/13/2020   History of MI (myocardial infarction) 07/13/2020   History of PTCA 1 07/13/2020   Other personal history presenting hazards to health 07/13/2020   Pure hypercholesterolemia 07/13/2020  Elevated lactic acid level 04/19/2020   Generalized weakness 04/09/2020   Fall at home 04/09/2020   Gastroenteritis 04/09/2020   AMS (altered mental status) 04/08/2020   Hypoxia 02/01/2020   Atrial fibrillation status post cardioversion (HCC) 12/22/2019   Congestive heart failure (CHF) (HCC) 12/13/2019   Hyponatremia 12/06/2019   Acute postoperative anemia due to expected blood loss 05/28/2019   Hyperlipidemia 05/28/2019   Ischemic cardiomyopathy 05/28/2019   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   Unstable angina (HCC) 05/24/2019   Chest pain 05/21/2019    Ischemic chest pain (HCC) 05/16/2019   CAD (coronary artery disease) 05/16/2019   HTN (hypertension) 05/16/2019   Depression 05/16/2019   GERD (gastroesophageal reflux disease) 05/16/2019   Verruca plantaris 01/23/2018   Obstructive sleep apnea syndrome 01/20/2018   Major depressive disorder, single episode, moderate (HCC) 07/05/2016   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 Zada Finders, MD  REFERRING PROVIDER: Dr. Zada Finders, MD/ [name], [facility]  REFERRING DIAG:  R26.9 (ICD-10-CM) - Unspecified abnormalities of gait and mobility  R26.89 (ICD-10-CM) - Other abnormalities of gait and mobility  R29.6 (ICD-10-CM) - Repeated falls    THERAPY DIAG:  Muscle weakness (generalized)  Balance problem  Other abnormalities of gait and mobility  Repeated falls  Rationale for Evaluation and Treatment: Rehabilitation  ONSET DATE: at least 2 years  SUBJECTIVE: (From initial evaluation note)  SUBJECTIVE STATEMENT: Pt reports he is having difficulty with gait and balance.  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   He feels like his balance and walking difficulty have worsened recently.  He reports he started falling frequently, he was catching his foot on things and tripping.  The most recent major fall was about 6 months ago.  He is being careful since then and trying to walk more to get stronger and trying to be more aware of his gait pattern.  He was walking with a Devin [name] until ~4 months ago, then started using a cane, and then has tried walking without any assistance.      He feels like his difficulty walking limits his  ability to get out of the house as much as he used to because he is fearful of falling.  PLOF- he enjoys getting out of the house for social activities, going out to breakfast too; going grocery shopping a few times/week to get his groceries; prior to his back surgery he walked at the park in [place], hasn't been back though because he is worried about falling though.  He has a home aide that comes a few days a week to assist with household tasks as needed.  Also reports he had a MI a little over a week ago and was in the hospital (8/3-10/16/22)- he states he is referred to begin cardiac rehab as part of his outpatient recovery.  PERTINENT HISTORY: Extensive cardiac history History of lumbar spine surgery- 2022 L4-5 microdiscectomy/laminectomy   PAIN:  Are you having pain? R groin muscle area where he had the cardiac catheterization procedure last week.     PRECAUTIONS: Fall  RED FLAGS: Pt with recent MI (10/13/22) with stent placement (see chart review)  WEIGHT BEARING RESTRICTIONS: No  FALLS:  Has patient fallen in last 6 months? Yes. Number of falls multiple  LIVING ENVIRONMENT: Lives with: lives alone (has a self reported "lady friend" for social support and enjoys being able to get out the house for community activities, going to restaurants together) Lives in: House/apartment Stairs:  he lives in a single level home with bonus room over the garage, but he does not need to go upstairs.1 step to enter in the garage- able to get in without falling. Has following equipment at home: Single point cane, Bland Rudzinski - 4 wheeled, and Wheelchair (manual) Has a home health aide that comes 2-3x/week if he needs assistance  OCCUPATION: retired  PLOF: Independent  PATIENT GOALS: to address drop foot and be able to manage it better; to strengthen his legs; and to increase his breathing; and improve his balance   NEXT MD VISIT: yes, next week   OBJECTIVE:   DIAGNOSTIC FINDINGS: no recent lower  extremity/spine imaging  PATIENT SURVEYS:  FOTO 39/52  COGNITION: Overall cognitive status: Within functional limits for tasks assessed     SENSATION: Pt reports reduced sensation to light touch L L4-5 dermatomes  EDEMA:  No pitting edema noted on dorsum of foot   POSTURE: pt stands with wide base of support for balance; uses UE support on LE's for sit to stand transfer  PALPATION: (-) TTP distal LE b/l  LOWER EXTREMITY ROM:  Active ROM Right eval Left eval  Hip flexion WNL WNL  Hip extension    Hip abduction    Hip adduction    Hip internal rotation    Hip external rotation    Knee flexion 105 105  Knee extension -5 -5  Ankle dorsiflexion 5 deg PROM 0 deg  Ankle plantarflexion    Great toe extension 45 deg PROM 40 deg   Ankle eversion     (Blank rows = not tested)  LOWER EXTREMITY MMT:  MMT Right eval Left eval  Hip flexion 4 4  Hip extension    Hip abduction    Hip adduction    Hip internal rotation    Hip external rotation    Knee flexion 5 5  Knee extension 4 4  Ankle dorsiflexion 4/5 1/5  Ankle plantarflexion 3/5 3/5  Great toe extension 4/5 2/5  Ankle eversion 4/5 3/5   (Blank rows = not tested)   FUNCTIONAL TESTS:  OUTCOME MEASURES: TEST Outcome Interpretation  5 times sit<>stand 21 seconds >60 yo, >15 sec indicates increased risk for falls  10 meter walk test deferred <1.0 m/s indicates increased risk for falls; limited community ambulator  [employer] Assessment /56   deferred <36/56 (100% risk for falls), 37-45 (80% risk for falls); 46-51 (>50% risk for falls); 52-55 (lower risk <25% of falls)      GAIT: Distance walked: from waiting area into PT gym 50 ft, PT supervision Assistive device utilized: None Level of assistance: Complete Independence Comments: Pt ambulates with decreased gait speed, wide BOS, decreased stride length, excess L hip flexion strategy to compensate for L foot drop  Pt not able to stand SLS on L without UE  support today; able to stand R LE x 5 seconds  TODAY'S TREATMENT:  DATE: 11/05/22  Subjective: Patient reports he has his first cardiac rehab appointment on Wednesday 8/28. Patient did not bring his AFO because he doesn't use it. Yesterday, patient reports feeling lethargic and difficulty walking until after 4 pm. Patient reports feeling like it was his amiodorone that they started ~3 weeks ago. Reports he chose to cut it in half this date.   Pain: 0/10  Objective: Vitals upon arrival SPO2: 95% HR: 54 sitting  Therapeutic Exercises: Seated marches: 3# AW 2x10  Seated knee extension: 3# AW 2x10   Seated heel raises: 3# AW 3x15   Alternating toe taps with 3# AW : 6 inch height, 2 x5 R LE, 2x5 L LE with finger tip support on railing  Standing hip abduction 3# AW x 10 each LE   Sit to stand x 5 with B UE support   Seated hip adduction ball squeeze 2 x 10 with 3 second hold  HR 55 after exercise, SPO2 99%  Pt ed for RPE scale, purpose of RPE to keep at light activity level while waiting to begin cardiac rehab  Amb with patient out to his car after session due to fatgue; he utilizes a wide base of support and hip flexion strategy due to L drop foot.  Discussed OTC ankle DF assist brace as he does not tolerate AFO well.   Discussed benefit/purpose of AFO; he states he has one at home, has not been wearing it bc it bothers his L knee when he wears it.  Asked him to bring to a future session to assess fit and gait while wearing it  Not today:  -Hurdles: forward direction in // bars (PT supervision with gait belt) 6 laps, focused on hip flexion strategy for foot clearance; attempted cue for "lift toes" but pt not able to actively perform active DF of ankle against gravity -Side step over hurdles in // bars: 6 laps -Airex: feet together, 3 trials x 30-40 seconds  with removing hand support -Airex: tandem stance, 3 trials each R in front/L in front x 20-30 seconds removing hand support (pt notes ankle/lower leg fatigue after this) -Front step up onto airex: x15 leading with R LE and x15 leading with L LE, b/l UE support   PATIENT EDUCATION:  Education details: PT POC/goals, importance of attending cardiac rehab Person educated: Patient Education method: Explanation Education comprehension: verbalized understanding and needs further education  HOME EXERCISE PROGRAM: Access Code: [email or web address] URL: [email or web address] Date: 10/24/2022 Prepared by: Devin [name]  Exercises - Seated Heel Raise  - 1 x daily - 3 x weekly - 2 sets - 20 reps - Seated March  - 1 x daily - 3 x weekly - 2 sets - 10 reps - Seated Long Arc Quad  - 1 x daily - 3 x weekly - 2 sets - 10 reps  ASSESSMENT:  CLINICAL IMPRESSION:  Patient arrives to treatment session reporting fatigue. Session focused on LE strengthening with increased resistance in sitting and standing. Patient with increased muscular fatigue at end of session. Ambulated with patient to car due to fatigue with patient utilizing hip strategy to assist with L foot clearance. Discussed use of SPC in the community for balance/support and OTC ankle DF assist brace to try out as patient unable to tolerate AFO. Patient will continue to benefit from skilled PT intervention to address listed impairments to decrease fall risk and improve overall mobility.   OBJECTIVE IMPAIRMENTS: Abnormal gait, cardiopulmonary status limiting activity, decreased activity tolerance, decreased balance, decreased  mobility, difficulty walking, decreased ROM, and decreased strength.   ACTIVITY LIMITATIONS: standing, squatting, stairs, transfers, and locomotion level  PARTICIPATION LIMITATIONS: meal prep, cleaning, laundry, interpersonal relationship, shopping, and community activity  PERSONAL FACTORS: Age, Past/current  experiences, Time since onset of injury/illness/exacerbation, and cardiovascular hx including recent MI with stent placement last week  are also affecting patient's functional outcome.   REHAB POTENTIAL: Good  CLINICAL DECISION MAKING: Stable/uncomplicated  EVALUATION COMPLEXITY: Low   GOALS: Goals reviewed with patient? Yes  SHORT TERM GOALS: Target date: 11/11/22 Pt will be able to perform a HEP for LE strengthening >3x/week Baseline: Goal status: INITIAL   LONG TERM GOALS: Target date: 12/04/22  Improve FOTO to >51 indicating pt able to perform his daily activities without being limited by his balance Baseline: 39 Goal status: INITIAL  2.  Pt will be able to amb with typical width BOS and b/l heel strike gait pattern using least restrictive AD and AFO on flat/incline/decline/grass surfaces x 10 min Baseline: not using any AD or AFO in clinic, pt wide BOS, no L heel strike due to foot drop Goal status: INITIAL  3.  Pt will be able to perform 5x STS in <15 sec indicating reduced risk for falling Baseline: 21 sec Goal status: INITIAL  PLAN:  PT FREQUENCY: 2x/week  PT DURATION: 6 weeks  PLANNED INTERVENTIONS: Therapeutic exercises, Therapeutic activity, Neuromuscular re-education, Balance training, Gait training, Patient/Family education, Self Care, Joint mobilization, and Orthotic/Fit training  PLAN FOR NEXT SESSION: 10 m walk test, assess BP and vitals, continue low intensity LE strength/balance, f/u to see if he contacted cardiac rehab to begin  Maylon Peppers, PT, DPT Physical Therapist - [facility]  [facility]   Viviann Spare, PT 11/05/2022, 2:44 PM

## 2022-11-06 ENCOUNTER — Encounter: Payer: Self-pay | Admitting: Cardiovascular Disease

## 2022-11-06 ENCOUNTER — Other Ambulatory Visit: Payer: Self-pay

## 2022-11-06 ENCOUNTER — Ambulatory Visit: Payer: Medicare HMO | Admitting: Cardiovascular Disease

## 2022-11-06 VITALS — BP 128/62 | Ht 72.0 in | Wt 208.2 lb

## 2022-11-06 DIAGNOSIS — I25119 Atherosclerotic heart disease of native coronary artery with unspecified angina pectoris: Secondary | ICD-10-CM

## 2022-11-06 DIAGNOSIS — I739 Peripheral vascular disease, unspecified: Secondary | ICD-10-CM

## 2022-11-06 DIAGNOSIS — I7143 Infrarenal abdominal aortic aneurysm, without rupture: Secondary | ICD-10-CM

## 2022-11-06 DIAGNOSIS — I255 Ischemic cardiomyopathy: Secondary | ICD-10-CM

## 2022-11-06 DIAGNOSIS — I10 Essential (primary) hypertension: Secondary | ICD-10-CM

## 2022-11-06 DIAGNOSIS — R001 Bradycardia, unspecified: Secondary | ICD-10-CM

## 2022-11-06 DIAGNOSIS — I4891 Unspecified atrial fibrillation: Secondary | ICD-10-CM | POA: Diagnosis not present

## 2022-11-06 MED ORDER — APIXABAN 2.5 MG PO TABS: 2.5000 mg | ORAL_TABLET | Freq: Two times a day (BID) | ORAL | 2 refills | Status: DC

## 2022-11-06 MED ORDER — METOPROLOL SUCCINATE ER 25 MG PO TB24
25.0000 mg | ORAL_TABLET | Freq: Every day | ORAL | 11 refills | Status: DC
Start: 2022-11-06 — End: 2023-01-18

## 2022-11-06 NOTE — Progress Notes (Signed)
Cardiology Office Note   Date:  11/06/2022   ID:  Devin [name]., DOB 25-Jul-1945, MRN [id number]  PCP:  Dione Housekeeper, MD  Cardiologist:  Adrian Blackwater, MD      History of Present Illness: Devin [name]. is a 77 y.o. male who presents for  Chief Complaint  Patient presents with  . Follow-up    Doing fine     Past Medical History:  Diagnosis Date  . AAA (abdominal aortic aneurysm) (HCC)   . Anxiety   . Aortic atherosclerosis (HCC)   . Arthritis   . Atrial fibrillation (HCC)   . CAD (coronary artery disease)   . CHF (congestive heart failure) (HCC)   . Current use of long term anticoagulation    Apixaban  . Depression   . Diabetes mellitus without complication (HCC)   . 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 Hypertension   . Ischemic cardiomyopathy   . MI, old   . Peripheral neuropathy   . PFO (patent foramen ovale) 05/2019   s/p repair  . Sleep apnea   . Spinal stenosis of lumbar region   . TIA (transient ischemic attack)      Past Surgical History:  Procedure Laterality Date  . APPENDECTOMY    . BREAST SURGERY Right    benign mass  . CARDIOVERSION Right 12/15/2019   Procedure: CARDIOVERSION;  Surgeon: Laurier Nancy, MD;  Location: [facility] ORS;  Service: Cardiovascular;  Laterality: Right;  . CATARACT EXTRACTION, BILATERAL    . COLONOSCOPY    . CORONARY ANGIOPLASTY WITH STENT PLACEMENT    . CORONARY ARTERY BYPASS GRAFT  05/2019   LIMA-LAD  . CORONARY STENT INTERVENTION N/A 10/15/2022   Procedure: CORONARY STENT INTERVENTION;  Surgeon: Marcina Millard, MD;  Location: [facility] INVASIVE CV LAB;  Service: Cardiovascular;  Laterality: N/A;  . LEFT HEART CATH N/A 10/15/2022   Procedure: Left Heart Cath;  Surgeon: Marcina Millard, MD;  Location: [facility] INV[facility];  Service: Cardiovascular;  Laterality: N/A;  . LEFT HEART CATH AND CORS/GRAFTS ANGIOGRAPHY N/A 05/21/2019   Procedure: LEFT HEART CATH AND CORONARY ANGIOGRAPHY;   Surgeon: Laurier Nancy, MD;  Location: [facility] INVASIVE CV LAB;  Service: Cardiovascular;  Laterality: N/A;  . LUMBAR LAMINECTOMY/DECOMPRESSION MICRODISCECTOMY N/A 07/04/2020   Procedure: L4-5 LAMINECTOMY;  Surgeon: Lucy Chris, MD;  Location: [facility] ORS;  Service: Neurosurgery;  Laterality: N/A;  . REPLACEMENT TOTAL KNEE BILATERAL       Current Outpatient Medications  Medication Sig Dispense Refill  . cephALEXin (KEFLEX) 500 MG capsule Take 500 mg by mouth 3 (three) times daily.    . metoprolol succinate (TOPROL XL) 25 MG 24 hr tablet Take 1 tablet (25 mg total) by mouth daily. 30 tablet 11  . albuterol (VENTOLIN HFA) 108 (90 Base) MCG/ACT inhaler Inhale 2 puffs into the lungs every 6 (six) hours as needed for wheezing or shortness of breath.    Marland Kitchen amiodarone (PACERONE) 200 MG tablet Take 200 mg by mouth daily. (Patient not taking: Reported on 11/06/2022)    . apixaban (ELIQUIS) 2.5 MG TABS tablet Take 1 tablet (2.5 mg total) by mouth 2 (two) times daily. 60 tablet 2  . aspirin 81 MG chewable tablet Chew 1 tablet (81 mg total) by mouth daily. 30 tablet 11  . atorvastatin (LIPITOR) 80 MG tablet Take 80 mg by mouth daily.    Marland Kitchen buPROPion (WELLBUTRIN XL) 300 MG 24 hr tablet Take 300 mg by mouth daily.    Marland Kitchen  clopidogrel (PLAVIX) 75 MG tablet Take 1 tablet (75 mg total) by mouth daily with breakfast. 30 tablet 11  . EPINEPHrine 0.3 mg/0.3 mL IJ SOAJ injection Inject 0.3 mg into the muscle as needed for anaphylaxis.    Marland Kitchen ezetimibe (ZETIA) 10 MG tablet Take 10 mg by mouth daily.    . fluticasone (FLONASE) 50 MCG/ACT nasal spray SPRAY 2 SPRAYS INTO EACH NOSTRIL EVERY DAY    . gabapentin (NEURONTIN) 300 MG capsule Take 300 mg by mouth 2 (two) times daily.    Marland Kitchen loratadine (CLARITIN) 10 MG tablet Take 10 mg by mouth daily.    . metFORMIN (GLUCOPHAGE) 500 MG tablet Take 1,000 mg by mouth 2 (two) times daily.    . nitroGLYCERIN (NITROSTAT) 0.4 MG SL tablet Place 0.4 mg under the tongue every 5 (five) minutes  as needed for chest pain.    . sacubitril-valsartan (ENTRESTO) 97-103 MG Hold until followup with Dr. Welton Flakes due to low blood pressure.     No current facility-administered medications for this visit.    Allergies:   Morphine, Yellow jacket venom [bee venom], and Trazodone    Social History:   reports that he has quit smoking. He has never used smokeless tobacco. He reports that he does not currently use alcohol. He reports that he does not use drugs.   Family History:  family history includes Osteoarthritis in his mother; Other (age of onset: 85) in his father.    ROS:     Review of Systems  Constitutional: Negative.   HENT: Negative.    Eyes: Negative.   Respiratory: Negative.    Gastrointestinal: Negative.   Genitourinary: Negative.   Musculoskeletal: Negative.   Skin: Negative.   Neurological: Negative.   Endo/Heme/Allergies: Negative.   Psychiatric/Behavioral: Negative.    All other systems reviewed and are negative.     All other systems are reviewed and negative.    PHYSICAL EXAM: VS:  BP 128/62   Ht 6' (1.829 m)   Wt 208 lb 3.2 oz (94.4 kg)   BMI 28.24 kg/m  , BMI Body mass index is 28.24 kg/m. Last weight:  Wt Readings from Last 3 Encounters:  11/06/22 208 lb 3.2 oz (94.4 kg)  10/18/22 204 lb (92.5 kg)  10/13/22 202 lb (91.6 kg)     Physical Exam Vitals reviewed.  Constitutional:      Appearance: Normal appearance. He is normal weight.  HENT:     Head: Normocephalic.     Nose: Nose normal.     Mouth/Throat:     Mouth: Mucous membranes are moist.  Eyes:     Pupils: Pupils are equal, round, and reactive to light.  Cardiovascular:     Rate and Rhythm: Normal rate and regular rhythm.     Pulses: Normal pulses.     Heart sounds: Normal heart sounds.  Pulmonary:     Effort: Pulmonary effort is normal.  Abdominal:     General: Abdomen is flat. Bowel sounds are normal.  Musculoskeletal:        General: Normal range of motion.     Cervical back:  Normal range of motion.  Skin:    General: Skin is warm.  Neurological:     General: No focal deficit present.     Mental Status: He is alert.  Psychiatric:        Mood and Affect: Mood normal.      EKG: sinus brady 50/min wnl  Recent Labs: 04/13/2022: ALT 30 10/16/2022: BUN 10;  Creatinine, Ser 0.89; Hemoglobin 13.8; Magnesium 2.3; Platelets 336; Potassium 4.1; Sodium 139    Lipid Panel    Component Value Date/Time   CHOL 105 10/13/2022 0313   TRIG 55 10/13/2022 0313   HDL 59 10/13/2022 0313   CHOLHDL 1.8 10/13/2022 0313   VLDL 11 10/13/2022 0313   LDLCALC 35 10/13/2022 0313      Other studies Reviewed: Additional studies/ records that were reviewed today include:  Review of the above records demonstrates:       No data to display            ASSESSMENT AND PLAN:    ICD-10-CM   1. Infrarenal abdominal aortic aneurysm (AAA) without rupture (HCC)  I71.43 metoprolol succinate (TOPROL XL) 25 MG 24 hr tablet    2. Atrial fibrillation status post cardioversion (HCC)  I48.91 metoprolol succinate (TOPROL XL) 25 MG 24 hr tablet   off amiodrone as gait disturbance, and became bradycardic. Also stopped coreg.    3. Coronary artery disease involving native coronary artery of native heart with angina pectoris (HCC)  I25.119 metoprolol succinate (TOPROL XL) 25 MG 24 hr tablet    4. Ischemic cardiomyopathy  I25.5 metoprolol succinate (TOPROL XL) 25 MG 24 hr tablet    5. Primary hypertension  I10 metoprolol succinate (TOPROL XL) 25 MG 24 hr tablet    6. PAD (peripheral artery disease) (HCC)  I73.9 metoprolol succinate (TOPROL XL) 25 MG 24 hr tablet    7. Sinus bradycardia  R00.1    Change metoprolol 25 bid to metorolol 25succinate daily       Problem List Items Addressed This Visit       Cardiovascular and Mediastinum   CAD (coronary artery disease)   Relevant Medications   metoprolol succinate (TOPROL XL) 25 MG 24 hr tablet   HTN (hypertension)   Relevant  Medications   metoprolol succinate (TOPROL XL) 25 MG 24 hr tablet   AAA (abdominal aortic aneurysm) (HCC) - Primary   Relevant Medications   metoprolol succinate (TOPROL XL) 25 MG 24 hr tablet   Atrial fibrillation status post cardioversion (HCC)   Relevant Medications   metoprolol succinate (TOPROL XL) 25 MG 24 hr tablet   Ischemic cardiomyopathy   Relevant Medications   metoprolol succinate (TOPROL XL) 25 MG 24 hr tablet   PAD (peripheral artery disease) (HCC)   Relevant Medications   metoprolol succinate (TOPROL XL) 25 MG 24 hr tablet   Other Visit Diagnoses     Sinus bradycardia       Change metoprolol 25 bid to metorolol 25succinate daily   Relevant Medications   metoprolol succinate (TOPROL XL) 25 MG 24 hr tablet          Disposition:   Return in about 4 weeks (around 12/04/2022).    Total time spent: 40 minutes  Signed,  Adrian Blackwater, MD  11/06/2022 10:48 AM    [facility]

## 2022-11-07 ENCOUNTER — Encounter: Payer: Medicare HMO | Attending: Cardiovascular Disease | Admitting: *Deleted

## 2022-11-07 ENCOUNTER — Ambulatory Visit: Payer: Medicare HMO

## 2022-11-07 ENCOUNTER — Encounter: Payer: Self-pay | Admitting: *Deleted

## 2022-11-07 DIAGNOSIS — M6281 Muscle weakness (generalized): Secondary | ICD-10-CM | POA: Diagnosis not present

## 2022-11-07 DIAGNOSIS — R2689 Other abnormalities of gait and mobility: Secondary | ICD-10-CM

## 2022-11-07 DIAGNOSIS — Z955 Presence of coronary angioplasty implant and graft: Secondary | ICD-10-CM

## 2022-11-07 DIAGNOSIS — I214 Non-ST elevation (NSTEMI) myocardial infarction: Secondary | ICD-10-CM

## 2022-11-07 NOTE — Therapy (Signed)
OUTPATIENT PHYSICAL THERAPY LOWER EXTREMITY TREATMENT   Patient Name: Devin [name] [facility]. MRN: [id number] DOB:1945/05/09, 77 y.o., male Today's Date: 11/08/2022  END OF SESSION:  PT End of Session - 11/07/22 1948     Visit Number 6    Number of Visits 10    Date for PT Re-Evaluation 12/04/22    Authorization Type 2x/week x 6 weeks (re-cert/PN 06/19/79)    PT Start Time 1200    PT Stop Time 1245    PT Time Calculation (min) 45 min    Activity Tolerance Patient tolerated treatment well    Behavior During Therapy WFL for tasks assessed/performed               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   Hypertension    Ischemic cardiomyopathy    MI, old    Peripheral neuropathy    PFO (patent foramen ovale) 05/2019   s/p repair   Sleep apnea    Spinal stenosis of lumbar region    TIA (transient ischemic attack)    Past Surgical History:  Procedure Laterality Date   APPENDECTOMY     BREAST SURGERY Right    benign mass   CARDIOVERSION Right 12/15/2019   Procedure: CARDIOVERSION;  Surgeon: Laurier Nancy, MD;  Location: [facility] ORS;  Service: Cardiovascular;  Laterality: Right;   CATARACT EXTRACTION, BILATERAL     COLONOSCOPY     CORONARY ANGIOPLASTY WITH STENT PLACEMENT     CORONARY ARTERY BYPASS GRAFT  05/2019   LIMA-LAD   CORONARY STENT INTERVENTION N/A 10/15/2022   Procedure: CORONARY STENT INTERVENTION;  Surgeon: Marcina Millard, MD;  Location: [facility] INVASIVE CV LAB;  Service: Cardiovascular;  Laterality: N/A;   LEFT HEART CATH N/A 10/15/2022   Procedure: Left Heart Cath;  Surgeon: Marcina Millard, MD;   Location: [facility] INV[facility]IVE CV [facility];  Service: Cardiovascular;  Laterality: N/A;   LEFT HEART CATH AND CORS/GRAFTS ANGIOGRAPHY N/A 05/21/2019   Procedure: LEFT HEART CATH AND CORONARY ANGIOGRAPHY;  Surgeon: Laurier Nancy, MD;  Location: [facility] INV[facility]IVE CV LAB;  Service: Cardiovascular;  Laterality: N/A;   LUMBAR LAMINECTOMY/DECOMPRESSION MICRODISCECTOMY N/A 07/04/2020   Procedure: L4-5 LAMINECTOMY;  Surgeon: Lucy Chris, MD;  Location: [facility] ORS;  Service: Neurosurgery;  Laterality: N/A;   REPLACEMENT TOTAL KNEE BILATERAL     Patient Active Problem List   Diagnosis Date Noted   NSTEMI (non-ST elevated myocardial infarction) (HCC) 10/13/2022   HFrEF (heart failure with reduced ejection fraction) (HCC) 10/13/2022   Degenerative joint disease (DJD) of lumbar spine 10/12/2022   Frequent falls 05/15/2022   Unsteady gait 04/16/2022   Acute delirium 04/13/2022   Electrolyte abnormality 04/13/2022   Nausea & vomiting 04/13/2022   PAD (peripheral artery disease) (HCC) 09/03/2021   Localized, primary osteoarthritis 11/24/2020   Wound healing, delayed 09/08/2020   History of lumbar surgery 09/08/2020   Chronic ischemic heart disease 07/13/2020   History of MI (myocardial infarction) 07/13/2020   History of PTCA 1 07/13/2020   Other personal history presenting hazards to health 07/13/2020   Pure hypercholesterolemia  07/13/2020   Elevated lactic acid level 04/19/2020   Generalized weakness 04/09/2020   Fall at home 04/09/2020   Gastroenteritis 04/09/2020   AMS (altered mental status) 04/08/2020   Hypoxia 02/01/2020   Atrial fibrillation status post cardioversion (HCC) 12/22/2019   Congestive heart failure (CHF) (HCC) 12/13/2019   Hyponatremia 12/06/2019   Acute postoperative anemia due to expected blood loss 05/28/2019   Hyperlipidemia 05/28/2019   Ischemic cardiomyopathy 05/28/2019   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   Unstable angina (HCC) 05/24/2019   Chest pain 05/21/2019    Ischemic chest pain (HCC) 05/16/2019   CAD (coronary artery disease) 05/16/2019   HTN (hypertension) 05/16/2019   Depression 05/16/2019   GERD (gastroesophageal reflux disease) 05/16/2019   Verruca plantaris 01/23/2018   Obstructive sleep apnea syndrome 01/20/2018   Major depressive disorder, single episode, moderate (HCC) 07/05/2016   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 Zada Finders, MD  REFERRING PROVIDER: Dr. Zada Finders, MD/ [name], [facility]  REFERRING DIAG:  R26.9 (ICD-10-CM) - Unspecified abnormalities of gait and mobility  R26.89 (ICD-10-CM) - Other abnormalities of gait and mobility  R29.6 (ICD-10-CM) - Repeated falls    THERAPY DIAG:  Muscle weakness (generalized)  Balance problem  Other abnormalities of gait and mobility  Rationale for Evaluation and Treatment: Rehabilitation  ONSET DATE: at least 2 years  SUBJECTIVE: (From initial evaluation note)  SUBJECTIVE STATEMENT: Pt reports he is having difficulty with gait and balance.  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   He feels like his balance and walking difficulty have worsened recently.  He reports he started falling frequently, he was catching his foot on things and tripping.  The most recent major fall was about 6 months ago.  He is being careful since then and trying to walk more to get stronger and trying to be more aware of his gait pattern.  He was walking with a walker until ~4 months ago, then started using a cane, and then has tried walking without any assistance.      He feels like his difficulty walking limits his ability to get out of  the house as much as he used to because he is fearful of falling.  PLOF- he enjoys getting out of the house for social activities, going out to breakfast too; going grocery shopping a few times/week to get his groceries; prior to his back surgery he walked at the park in [place], hasn't been back though because he is worried about falling though.  He has a home aide that comes a few days a week to assist with household tasks as needed.  Also reports he had a MI a little over a week ago and was in the hospital (8/3-10/16/22)- he states he is referred to begin cardiac rehab as part of his outpatient recovery.  PERTINENT HISTORY: Extensive cardiac history History of lumbar spine surgery- 2022 L4-5 microdiscectomy/laminectomy   PAIN:  Are you having pain? R groin muscle area where he had the cardiac catheterization procedure last week.     PRECAUTIONS: Fall  RED FLAGS: Pt with recent MI (10/13/22) with stent placement (see chart review)  WEIGHT BEARING RESTRICTIONS: No  FALLS:  Has patient fallen in last 6 months? Yes. Number of falls multiple  LIVING ENVIRONMENT: Lives with: lives alone (has a self reported "lady friend" for social support and enjoys being able to get out the house for community activities, going to restaurants together) Lives in: House/apartment Stairs:  he lives in a single level home with bonus room over the garage, but he does not need to go upstairs.1 step to enter in the garage- able to get in without falling. Has following equipment at home: Single point cane, Walker - 4 wheeled, and Wheelchair (manual) Has a home health aide that comes 2-3x/week if he needs assistance  OCCUPATION: retired  PLOF: Independent  PATIENT GOALS: to address drop foot and be able to manage it better; to strengthen his legs; and to increase his breathing; and improve his balance   NEXT MD VISIT: yes, next week   OBJECTIVE:   DIAGNOSTIC FINDINGS: no recent lower extremity/spine  imaging  PATIENT SURVEYS:  FOTO 39/52  COGNITION: Overall cognitive status: Within functional limits for tasks assessed     SENSATION: Pt reports reduced sensation to light touch L L4-5 dermatomes  EDEMA:  No pitting edema noted on dorsum of foot   POSTURE: pt stands with wide base of support for balance; uses UE support on LE's for sit to stand transfer  PALPATION: (-) TTP distal LE b/l  LOWER EXTREMITY ROM:  Active ROM Right eval Left eval  Hip flexion WNL WNL  Hip extension    Hip abduction    Hip adduction    Hip internal rotation    Hip external rotation    Knee flexion 105 105  Knee extension -5 -5  Ankle dorsiflexion 5 deg PROM 0 deg  Ankle plantarflexion    Great toe extension 45 deg PROM 40 deg   Ankle eversion     (Blank rows = not tested)  LOWER EXTREMITY MMT:  MMT Right eval Left eval  Hip flexion 4 4  Hip extension    Hip abduction    Hip adduction    Hip internal rotation    Hip external rotation    Knee flexion 5 5  Knee extension 4 4  Ankle dorsiflexion 4/5 1/5  Ankle plantarflexion 3/5 3/5  Great toe extension 4/5 2/5  Ankle eversion 4/5 3/5   (Blank rows = not tested)   FUNCTIONAL TESTS:  OUTCOME MEASURES: TEST Outcome Interpretation  5 times sit<>stand 21 seconds >60 yo, >15 sec indicates increased risk for falls  10 meter walk test deferred <1.0 m/s indicates increased risk for falls; limited community ambulator  [employer] Assessment /56   deferred <36/56 (100% risk for falls), 37-45 (80% risk for falls); 46-51 (>50% risk for falls); 52-55 (lower risk <25% of falls)      GAIT: Distance walked: from waiting area into PT gym 50 ft, PT supervision Assistive device utilized: None Level of assistance: Complete Independence Comments: Pt ambulates with decreased gait speed, wide BOS, decreased stride length, excess L hip flexion strategy to compensate for L foot drop  Pt not able to stand SLS on L without UE support today;  able to stand R LE x 5 seconds  TODAY'S TREATMENT:  DATE: 11/08/22  Subjective: Patient reports he has his first cardiac rehab appointment next week. Had an appointment with his physician and reports his medication changed and he is feeling stronger and less fatigue.  Not sure exactly which medication.  Pain: 0/10  Objective: Vitals upon arrival SPO2: 95% HR: 60 sitting 153/73 BP  Therapeutic Exercises: Seated marches: 3# AW 2x10  Seated knee extension: 3# AW 2x10   Seated hip adduction ball squeeze 2 x 10 with 3 second hold  Standing heel raises: 2x10  Alternating toe taps : 6 inch height, 2 x5 R LE, 2x5 L LE with finger tip support on railing  Standing hip abduction 3# AW x 10 each LE   Step ups onto airex: with 2 finger support ea UE; x10 L and x10 R  Static balance on airex feet together NBOS x 1 min  Sit to stand x 7 without B UE support - pt notes this is "difficult" at end of session today  HR 64 after exercise, SPO2 99%  Pt ed for RPE scale, purpose of RPE to keep at light activity level while waiting to begin cardiac rehab  Amb with patient out to his car after session due to fatgue; he utilizes a wide base of support and hip flexion strategy due to L drop foot.  Discussed OTC ankle DF assist brace as he does not tolerate AFO well.    Not today:  -Hurdles: forward direction in // bars (PT supervision with gait belt) 6 laps, focused on hip flexion strategy for foot clearance; attempted cue for "lift toes" but pt not able to actively perform active DF of ankle against gravity -Side step over hurdles in // bars: 6 laps -Airex: feet together, 3 trials x 30-40 seconds with removing hand support -Airex: tandem stance, 3 trials each R in front/L in front x 20-30 seconds removing hand support (pt notes ankle/lower leg fatigue after this) -Front  step up onto airex: x15 leading with R LE and x15 leading with L LE, b/l UE support   PATIENT EDUCATION:  Education details: PT POC/goals, importance of attending cardiac rehab Person educated: Patient Education method: Explanation Education comprehension: verbalized understanding and needs further education  HOME EXERCISE PROGRAM: Access Code: [email or web address] URL: [email or web address] Date: 10/24/2022 Prepared by: Max Fickle  Exercises - Seated Heel Raise  - 1 x daily - 3 x weekly - 2 sets - 20 reps - Seated March  - 1 x daily - 3 x weekly - 2 sets - 10 reps - Seated Long Arc Quad  - 1 x daily - 3 x weekly - 2 sets - 10 reps  ASSESSMENT:  CLINICAL IMPRESSION:  Patient arrives to treatment session reporting less fatigue than last visit; he attributes this to helpful medication changes this week. He used his SPC to arrive and leave session today and I observe he does ambulate with improved push off at terminal stance and a more typical width base of support instead of a wide, unsteady base of support.  [facility] improves his safety during ambulation.  Balance exercises on uneven surfaces remains challenging.  Discussed OTC DF assist brace again as a possible option as he notes his AFO is not comfortable.  Overall, he tolerated session well today.  Patient will continue to benefit from skilled PT intervention to address listed impairments to decrease fall risk and improve overall mobility.   OBJECTIVE IMPAIRMENTS: Abnormal gait, cardiopulmonary status limiting activity, decreased activity tolerance, decreased balance, decreased mobility, difficulty walking,  decreased ROM, and decreased strength.   ACTIVITY LIMITATIONS: standing, squatting, stairs, transfers, and locomotion level  PARTICIPATION LIMITATIONS: meal prep, cleaning, laundry, interpersonal relationship, shopping, and community activity  PERSONAL FACTORS: Age, Past/current experiences, Time since onset of  injury/illness/exacerbation, and cardiovascular hx including recent MI with stent placement last week  are also affecting patient's functional outcome.   REHAB POTENTIAL: Good  CLINICAL DECISION MAKING: Stable/uncomplicated  EVALUATION COMPLEXITY: Low   GOALS: Goals reviewed with patient? Yes  SHORT TERM GOALS: Target date: 11/11/22 Pt will be able to perform a HEP for LE strengthening >3x/week Baseline: Goal status: INITIAL   LONG TERM GOALS: Target date: 12/04/22  Improve FOTO to >51 indicating pt able to perform his daily activities without being limited by his balance Baseline: 39 Goal status: INITIAL  2.  Pt will be able to amb with typical width BOS and b/l heel strike gait pattern using least restrictive AD and AFO on flat/incline/decline/grass surfaces x 10 min Baseline: not using any AD or AFO in clinic, pt wide BOS, no L heel strike due to foot drop Goal status: INITIAL  3.  Pt will be able to perform 5x STS in <15 sec indicating reduced risk for falling Baseline: 21 sec Goal status: INITIAL  PLAN:  PT FREQUENCY: 2x/week  PT DURATION: 6 weeks  PLANNED INTERVENTIONS: Therapeutic exercises, Therapeutic activity, Neuromuscular re-education, Balance training, Gait training, Patient/Family education, Self Care, Joint mobilization, and Orthotic/Fit training  PLAN FOR NEXT SESSION: continue to assess BP and vitals, continue low intensity LE strength/balance, f/u to see if he contacted cardiac rehab to begin  Max Fickle, PT, DPT, OCS  Physical Therapist - [facility]   Ardine Bjork, PT 11/08/2022, 7:57 PM

## 2022-11-07 NOTE — Progress Notes (Signed)
Virtual orientation call completed today. he has an appointment on Date: [id number]  for EP eval and gym Orientation.  Documentation of diagnosis can be found in [facility]  Date: 10/13/2022 Letter sent for AAA parameters.

## 2022-11-14 ENCOUNTER — Ambulatory Visit: Payer: Medicare HMO | Attending: Family Medicine

## 2022-11-14 DIAGNOSIS — M6281 Muscle weakness (generalized): Secondary | ICD-10-CM

## 2022-11-14 DIAGNOSIS — R2689 Other abnormalities of gait and mobility: Secondary | ICD-10-CM | POA: Diagnosis present

## 2022-11-14 NOTE — Therapy (Signed)
OUTPATIENT PHYSICAL THERAPY LOWER EXTREMITY TREATMENT   Patient Name: Tacori Kulaga [facility]. MRN: [id number] DOB:1946-01-05, 77 y.o., male Today's Date: 11/14/2022  END OF SESSION:  PT End of Session - 11/14/22 1210     Visit Number 7    Number of Visits 10    Date for PT Re-Evaluation 12/04/22    Authorization Type 2x/week x 6 weeks (re-cert/PN 10/04/34)    PT Start Time 1205    PT Stop Time 1245    PT Time Calculation (min) 40 min    Activity Tolerance Patient tolerated treatment well    Behavior During Therapy WFL for tasks assessed/performed               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   Hypertension    Ischemic cardiomyopathy    MI, old    Peripheral neuropathy    PFO (patent foramen ovale) 05/2019   s/p repair   Sleep apnea    Spinal stenosis of lumbar region    TIA (transient ischemic attack)    Past Surgical History:  Procedure Laterality Date   APPENDECTOMY     BREAST SURGERY Right    benign mass   CARDIOVERSION Right 12/15/2019   Procedure: CARDIOVERSION;  Surgeon: Laurier Nancy, MD;  Location: [facility] ORS;  Service: Cardiovascular;  Laterality: Right;   CATARACT EXTRACTION, BILATERAL     COLONOSCOPY     CORONARY ANGIOPLASTY WITH STENT PLACEMENT     CORONARY ARTERY BYPASS GRAFT  05/2019   LIMA-LAD   CORONARY STENT INTERVENTION N/A 10/15/2022   Procedure: CORONARY STENT INTERVENTION;  Surgeon: Marcina Millard, MD;  Location: [facility] INVASIVE CV LAB;  Service: Cardiovascular;  Laterality: N/A;   LEFT HEART CATH N/A 10/15/2022   Procedure: Left Heart Cath;  Surgeon: Marcina Millard, MD;   Location: [facility] INVASIVE CV LAB;  Service: Cardiovascular;  Laterality: N/A;   LEFT HEART CATH AND CORS/GRAFTS ANGIOGRAPHY N/A 05/21/2019   Procedure: LEFT HEART CATH AND CORONARY ANGIOGRAPHY;  Surgeon: Laurier Nancy, MD;  Location: [facility] INVASIVE CV LAB;  Service: Cardiovascular;  Laterality: N/A;   LUMBAR LAMINECTOMY/DECOMPRESSION MICRODISCECTOMY N/A 07/04/2020   Procedure: L4-5 LAMINECTOMY;  Surgeon: Lucy Chris, MD;  Location: [facility] ORS;  Service: Neurosurgery;  Laterality: N/A;   REPLACEMENT TOTAL KNEE BILATERAL     Patient Active Problem List   Diagnosis Date Noted   NSTEMI (non-ST elevated myocardial infarction) (HCC) 10/13/2022   HFrEF (heart failure with reduced ejection fraction) (HCC) 10/13/2022   Degenerative joint disease (DJD) of lumbar spine 10/12/2022   Frequent falls 05/15/2022   Unsteady gait 04/16/2022   Acute delirium 04/13/2022   Electrolyte abnormality 04/13/2022   Nausea & vomiting 04/13/2022   PAD (peripheral artery disease) (HCC) 09/03/2021   Localized, primary osteoarthritis 11/24/2020   Wound healing, delayed 09/08/2020   History of lumbar surgery 09/08/2020   Chronic ischemic heart disease 07/13/2020   History of MI (myocardial infarction) 07/13/2020   History of PTCA 1 07/13/2020   Other personal history presenting hazards to health 07/13/2020   Pure hypercholesterolemia  07/13/2020   Elevated lactic acid level 04/19/2020   Generalized weakness 04/09/2020   Fall at home 04/09/2020   Gastroenteritis 04/09/2020   AMS (altered mental status) 04/08/2020   Hypoxia 02/01/2020   Atrial fibrillation status post cardioversion (HCC) 12/22/2019   Congestive heart failure (CHF) (HCC) 12/13/2019   Hyponatremia 12/06/2019   Acute postoperative anemia due to expected blood loss 05/28/2019   Hyperlipidemia 05/28/2019   Ischemic cardiomyopathy 05/28/2019   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   Unstable angina (HCC) 05/24/2019   Chest pain 05/21/2019    Ischemic chest pain (HCC) 05/16/2019   CAD (coronary artery disease) 05/16/2019   HTN (hypertension) 05/16/2019   Depression 05/16/2019   GERD (gastroesophageal reflux disease) 05/16/2019   Verruca plantaris 01/23/2018   Obstructive sleep apnea syndrome 01/20/2018   Major depressive disorder, single episode, moderate (HCC) 07/05/2016   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 Zada Finders, MD  REFERRING PROVIDER: Dr. Zada Finders, MD/ [name], [facility]  REFERRING DIAG:  R26.9 (ICD-10-CM) - Unspecified abnormalities of gait and mobility  R26.89 (ICD-10-CM) - Other abnormalities of gait and mobility  R29.6 (ICD-10-CM) - Repeated falls    THERAPY DIAG:  Muscle weakness (generalized)  Balance problem  Other abnormalities of gait and mobility  Rationale for Evaluation and Treatment: Rehabilitation  ONSET DATE: at least 2 years  SUBJECTIVE: (From initial evaluation note)  SUBJECTIVE STATEMENT: Pt reports he is having difficulty with gait and balance.  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   He feels like his balance and walking difficulty have worsened recently.  He reports he started falling frequently, he was catching his foot on things and tripping.  The most recent major fall was about 6 months ago.  He is being careful since then and trying to walk more to get stronger and trying to be more aware of his gait pattern.  He was walking with a walker until ~4 months ago, then started using a cane, and then has tried walking without any assistance.      He feels like his difficulty walking limits his ability to get out of  the house as much as he used to because he is fearful of falling.  PLOF- he enjoys getting out of the house for social activities, going out to breakfast too; going grocery shopping a few times/week to get his groceries; prior to his back surgery he walked at the park in [place], hasn't been back though because he is worried about falling though.  He has a home aide that comes a few days a week to assist with household tasks as needed.  Also reports he had a MI a little over a week ago and was in the hospital (8/3-10/16/22)- he states he is referred to begin cardiac rehab as part of his outpatient recovery.  PERTINENT HISTORY: Extensive cardiac history History of lumbar spine surgery- 2022 L4-5 microdiscectomy/laminectomy   PAIN:  Are you having pain? R groin muscle area where he had the cardiac catheterization procedure last week.     PRECAUTIONS: Fall  RED FLAGS: Pt with recent MI (10/13/22) with stent placement (see chart review)  WEIGHT BEARING RESTRICTIONS: No  FALLS:  Has patient fallen in last 6 months? Yes. Number of falls multiple  LIVING ENVIRONMENT: Lives with: lives alone (has a self reported "lady friend" for social support and enjoys being able to get out the house for community activities, going to restaurants together) Lives in: House/apartment Stairs:  he lives in a single level home with bonus room over the garage, but he does not need to go upstairs.1 step to enter in the garage- able to get in without falling. Has following equipment at home: Single point cane, Walker - 4 wheeled, and Wheelchair (manual) Has a home health aide that comes 2-3x/week if he needs assistance  OCCUPATION: retired  PLOF: Independent  PATIENT GOALS: to address drop foot and be able to manage it better; to strengthen his legs; and to increase his breathing; and improve his balance   NEXT MD VISIT: yes, next week   OBJECTIVE:   DIAGNOSTIC FINDINGS: no recent lower extremity/spine  imaging  PATIENT SURVEYS:  FOTO 39/52  COGNITION: Overall cognitive status: Within functional limits for tasks assessed     SENSATION: Pt reports reduced sensation to light touch L L4-5 dermatomes  EDEMA:  No pitting edema noted on dorsum of foot   POSTURE: pt stands with wide base of support for balance; uses UE support on LE's for sit to stand transfer  PALPATION: (-) TTP distal LE b/l  LOWER EXTREMITY ROM:  Active ROM Right eval Left eval  Hip flexion WNL WNL  Hip extension    Hip abduction    Hip adduction    Hip internal rotation    Hip external rotation    Knee flexion 105 105  Knee extension -5 -5  Ankle dorsiflexion 5 deg PROM 0 deg  Ankle plantarflexion    Great toe extension 45 deg PROM 40 deg   Ankle eversion     (Blank rows = not tested)  LOWER EXTREMITY MMT:  MMT Right eval Left eval  Hip flexion 4 4  Hip extension    Hip abduction    Hip adduction    Hip internal rotation    Hip external rotation    Knee flexion 5 5  Knee extension 4 4  Ankle dorsiflexion 4/5 1/5  Ankle plantarflexion 3/5 3/5  Great toe extension 4/5 2/5  Ankle eversion 4/5 3/5   (Blank rows = not tested)   FUNCTIONAL TESTS:  OUTCOME MEASURES: TEST Outcome Interpretation  5 times sit<>stand 21 seconds >60 yo, >15 sec indicates increased risk for falls  10 meter walk test deferred <1.0 m/s indicates increased risk for falls; limited community ambulator  [employer] Assessment /56   deferred <36/56 (100% risk for falls), 37-45 (80% risk for falls); 46-51 (>50% risk for falls); 52-55 (lower risk <25% of falls)      GAIT: Distance walked: from waiting area into PT gym 50 ft, PT supervision Assistive device utilized: None Level of assistance: Complete Independence Comments: Pt ambulates with decreased gait speed, wide BOS, decreased stride length, excess L hip flexion strategy to compensate for L foot drop  Pt not able to stand SLS on L without UE support today;  able to stand R LE x 5 seconds  TODAY'S TREATMENT:  DATE: 11/14/22  Subjective: Patient reports he has his first cardiac rehab appointment next week.  He feels like he is walking a little better since starting PT.  He feels like he can walk a little without his [facility] for short distances with better balance.  Pain: 0/10  Objective: Vitals upon arrival SPO2: 96% HR: 67 sitting 153/73 BP Focused on RPE in "light activity" for session   Therapeutic Exercises: Sit to stand x 5, 2 sets at start of session from elevated table without B UE support   Seated marches: 3# AW 2x10  Seated knee extension: 3# AW 2x10   Seated hip adduction ball squeeze 2 x 10 with 3 second hold  Standing heel raises: 2x10  Alternating toe taps : 6 inch height, 2 x5 R LE, 2x5 L LE with finger tip support on railing, emphasis on hip flexion strategy due to L foot drop  Standing hip abduction 3# AW x 10 each LE   Step ups onto 6 inch step: with 2 finger support ea UE; x10 L and x10 R  SLS balance: 5x 20 second trials on L and R with single UE support and removing hand briefly- more steady on R  HR 64 after exercise, SPO2 99%  Pt ed for RPE scale, purpose of RPE to keep at light activity level while waiting to begin cardiac rehab  Amb with patient out to his car after session; observed he is amb with less of a wide base of support, improved push off at terminal stance observed bilaterally; overall improved sagittal plane movement and less compensatory frontal plane motion in trunk and lower extremities noted   Not today:  -Hurdles: forward direction in // bars (PT supervision with gait belt) 6 laps, focused on hip flexion strategy for foot clearance; attempted cue for "lift toes" but pt not able to actively perform active DF of ankle against gravity -Side step over hurdles in // bars: 6  laps -Airex: feet together, 3 trials x 30-40 seconds with removing hand support -Airex: tandem stance, 3 trials each R in front/L in front x 20-30 seconds removing hand support (pt notes ankle/lower leg fatigue after this) -Front step up onto airex: x15 leading with R LE and x15 leading with L LE, b/l UE support   PATIENT EDUCATION:  Education details: PT POC/goals, importance of attending cardiac rehab, activity pacing Person educated: Patient Education method: Explanation Education comprehension: verbalized understanding and needs further education  HOME EXERCISE PROGRAM: Access Code: [email or web address] URL: [email or web address] Date: 10/24/2022 Prepared by: Max Fickle  Exercises - Seated Heel Raise  - 1 x daily - 3 x weekly - 2 sets - 20 reps - Seated March  - 1 x daily - 3 x weekly - 2 sets - 10 reps - Seated Long Arc Quad  - 1 x daily - 3 x weekly - 2 sets - 10 reps  ASSESSMENT:  CLINICAL IMPRESSION:  Patient amb with slightly improved gait pattern on flat surfaces in the clinic.  Advised him to continue using his [facility] for safety when he amb longer distances, especially without AFO or ankle DF brace.  He begins cardiac rehab sessions next week.  Will adjust PT schedule accordingly.  Overall, he tolerated session well today.  Patient will continue to benefit from skilled PT intervention to address listed impairments to decrease fall risk and improve overall mobility.   OBJECTIVE IMPAIRMENTS: Abnormal gait, cardiopulmonary status limiting activity, decreased activity tolerance, decreased balance, decreased mobility, difficulty walking, decreased ROM,  and decreased strength.   ACTIVITY LIMITATIONS: standing, squatting, stairs, transfers, and locomotion level  PARTICIPATION LIMITATIONS: meal prep, cleaning, laundry, interpersonal relationship, shopping, and community activity  PERSONAL FACTORS: Age, Past/current experiences, Time since onset of injury/illness/exacerbation,  and cardiovascular hx including recent MI with stent placement last week  are also affecting patient's functional outcome.   REHAB POTENTIAL: Good  CLINICAL DECISION MAKING: Stable/uncomplicated  EVALUATION COMPLEXITY: Low   GOALS: Goals reviewed with patient? Yes  SHORT TERM GOALS: Target date: 11/11/22 Pt will be able to perform a HEP for LE strengthening >3x/week Baseline: Goal status: INITIAL   LONG TERM GOALS: Target date: 12/04/22  Improve FOTO to >51 indicating pt able to perform his daily activities without being limited by his balance Baseline: 39 Goal status: INITIAL  2.  Pt will be able to amb with typical width BOS and b/l heel strike gait pattern using least restrictive AD and AFO on flat/incline/decline/grass surfaces x 10 min Baseline: not using any AD or AFO in clinic, pt wide BOS, no L heel strike due to foot drop Goal status: INITIAL  3.  Pt will be able to perform 5x STS in <15 sec indicating reduced risk for falling Baseline: 21 sec Goal status: INITIAL  PLAN:  PT FREQUENCY: 2x/week  PT DURATION: 6 weeks  PLANNED INTERVENTIONS: Therapeutic exercises, Therapeutic activity, Neuromuscular re-education, Balance training, Gait training, Patient/Family education, Self Care, Joint mobilization, and Orthotic/Fit training  PLAN FOR NEXT SESSION: continue to assess BP and vitals, continue low intensity LE strength/balance  Max Fickle, PT, DPT, OCS  Physical Therapist - [facility]   Vinnie Langton Adrian, PT 11/14/2022, 12:11 PM

## 2022-11-19 ENCOUNTER — Ambulatory Visit: Payer: Medicare HMO | Admitting: Cardiovascular Disease

## 2022-11-19 ENCOUNTER — Encounter: Payer: Medicare HMO | Attending: Cardiovascular Disease

## 2022-11-19 ENCOUNTER — Ambulatory Visit: Payer: Medicare HMO

## 2022-11-19 VITALS — Ht 71.5 in | Wt 205.2 lb

## 2022-11-19 DIAGNOSIS — M6281 Muscle weakness (generalized): Secondary | ICD-10-CM | POA: Diagnosis not present

## 2022-11-19 DIAGNOSIS — R2689 Other abnormalities of gait and mobility: Secondary | ICD-10-CM

## 2022-11-19 DIAGNOSIS — I214 Non-ST elevation (NSTEMI) myocardial infarction: Secondary | ICD-10-CM

## 2022-11-19 DIAGNOSIS — Z955 Presence of coronary angioplasty implant and graft: Secondary | ICD-10-CM

## 2022-11-19 NOTE — Patient Instructions (Signed)
Patient Instructions  Patient Details  Name: Devin [name]. MRN: [id number] Date of Birth: 08-10-1945 Referring Provider:  Laurier Nancy, MD  Below are your personal goals for exercise, nutrition, and risk factors. Our goal is to help you stay on track towards obtaining and maintaining these goals. We will be discussing your progress on these goals with you throughout the program.  Initial Exercise Prescription:  Initial Exercise Prescription - 11/19/22 1400       Date of Initial Exercise RX and Referring Provider   Date 11/19/22    Referring Provider Dr. Adrian Blackwater MD      Oxygen   Maintain Oxygen Saturation 88% or higher      Treadmill   MPH 1    Grade 0    Minutes 15    METs 1.8      NuStep   Level 1    SPM 80    Minutes 15    METs 1.02      Arm Ergometer   Level 1    RPM 50    Minutes 15    METs 1.02      Track   Laps 16    Minutes 15    METs 1.87      Prescription Details   Frequency (times per week) 2    Duration Progress to 30 minutes of continuous aerobic without signs/symptoms of physical distress      Intensity   THRR 40-80% of Max Heartrate 90-124    Ratings of Perceived Exertion 11-13    Perceived Dyspnea 0-4      Progression   Progression Continue to progress workloads to maintain intensity without signs/symptoms of physical distress.      Resistance Training   Training Prescription Yes    Weight 4 lb    Reps 10-15             Exercise Goals: Frequency: Be able to perform aerobic exercise two to three times per week in program working toward 2-5 days per week of home exercise.  Intensity: Work with a perceived exertion of 11 (fairly light) - 15 (hard) while following your exercise prescription.  We will make changes to your prescription with you as you progress through the program.   Duration: Be able to do 30 to 45 minutes of continuous aerobic exercise in addition to a 5 minute warm-up and a 5 minute cool-down  routine.   Nutrition Goals: Your personal nutrition goals will be established when you do your nutrition analysis with the dietician.  The following are general nutrition guidelines to follow: Cholesterol < 200mg /day Sodium < 1500mg /day Fiber: Men over 50 yrs - 30 grams per day  Personal Goals:  Personal Goals and Risk Factors at Admission - 11/07/22 1008       Core Components/Risk Factors/Patient Goals on Admission    Weight Management Yes;Weight Maintenance    Intervention Weight Management: Develop a combined nutrition and exercise program designed to reach desired caloric intake, while maintaining appropriate intake of nutrient and fiber, sodium and fats, and appropriate energy expenditure required for the weight goal.    Admit Weight 204 lb (92.5 kg)    Goal Weight: Long Term 204 lb (92.5 kg)    Expected Outcomes Short Term: Continue to assess and modify interventions until short term weight is achieved;Long Term: Adherence to nutrition and physical activity/exercise program aimed toward attainment of established weight goal;Weight Maintenance: Understanding of the daily nutrition guidelines, which includes 25-35% calories from  fat, 7% or less cal from saturated fats, less than 200mg  cholesterol, less than 1.5gm of sodium, & 5 or more servings of fruits and vegetables daily    Diabetes Yes    Intervention Provide education about signs/symptoms and action to take for hypo/hyperglycemia.;Provide education about proper nutrition, including hydration, and aerobic/resistive exercise prescription along with prescribed medications to achieve blood glucose in normal ranges: Fasting glucose 65-99 mg/dL    Expected Outcomes Short Term: Participant verbalizes understanding of the signs/symptoms and immediate care of hyper/hypoglycemia, proper foot care and importance of medication, aerobic/resistive exercise and nutrition plan for blood glucose control.;Long Term: Attainment of HbA1C < 7%.    Heart  Failure Yes    Intervention Provide a combined exercise and nutrition program that is supplemented with education, support and counseling about heart failure. Directed toward relieving symptoms such as shortness of breath, decreased exercise tolerance, and extremity edema.    Expected Outcomes Improve functional capacity of life;Short term: Attendance in program 2-3 days a week with increased exercise capacity. Reported lower sodium intake. Reported increased fruit and vegetable intake. Reports medication compliance.;Short term: Daily weights obtained and reported for increase. Utilizing diuretic protocols set by physician.;Long term: Adoption of self-care skills and reduction of barriers for early signs and symptoms recognition and intervention leading to self-care maintenance.    Hypertension Yes    Intervention Provide education on lifestyle modifcations including regular physical activity/exercise, weight management, moderate sodium restriction and increased consumption of fresh fruit, vegetables, and low fat dairy, alcohol moderation, and smoking cessation.;Monitor prescription use compliance.    Expected Outcomes Short Term: Continued assessment and intervention until BP is < 140/77mm HG in hypertensive participants. < 130/67mm HG in hypertensive participants with diabetes, heart failure or chronic kidney disease.;Long Term: Maintenance of blood pressure at goal levels.    Lipids Yes    Intervention Provide education and support for participant on nutrition & aerobic/resistive exercise along with prescribed medications to achieve LDL 70mg , HDL >40mg .    Expected Outcomes Short Term: Participant states understanding of desired cholesterol values and is compliant with medications prescribed. Participant is following exercise prescription and nutrition guidelines.;Long Term: Cholesterol controlled with medications as prescribed, with individualized exercise RX and with personalized nutrition plan. Value  goals: LDL < 70mg , HDL > 40 mg.            Exercise Goals and Review:  Exercise Goals     Row Name 11/19/22 1442             Exercise Goals   Increase Physical Activity Yes       Intervention Provide advice, education, support and counseling about physical activity/exercise needs.;Develop an individualized exercise prescription for aerobic and resistive training based on initial evaluation findings, risk stratification, comorbidities and participant's personal goals.       Expected Outcomes Long Term: Add in home exercise to make exercise part of routine and to increase amount of physical activity.;Long Term: Exercising regularly at least 3-5 days a week.;Short Term: Attend rehab on a regular basis to increase amount of physical activity.       Increase Strength and Stamina Yes       Intervention Provide advice, education, support and counseling about physical activity/exercise needs.;Develop an individualized exercise prescription for aerobic and resistive training based on initial evaluation findings, risk stratification, comorbidities and participant's personal goals.       Expected Outcomes Short Term: Increase workloads from initial exercise prescription for resistance, speed, and METs.;Short Term: Perform resistance training  exercises routinely during rehab and add in resistance training at home;Long Term: Improve cardiorespiratory fitness, muscular endurance and strength as measured by increased METs and functional capacity ( )       Able to understand and use rate of perceived exertion (RPE) scale Yes       Intervention Provide education and explanation on how to use RPE scale       Expected Outcomes Short Term: Able to use RPE daily in rehab to express subjective intensity level;Long Term:  Able to use RPE to guide intensity level when exercising independently       Able to understand and use Dyspnea scale Yes       Intervention Provide education and explanation on how to use  Dyspnea scale       Expected Outcomes Short Term: Able to use Dyspnea scale daily in rehab to express subjective sense of shortness of breath during exertion;Long Term: Able to use Dyspnea scale to guide intensity level when exercising independently       Knowledge and understanding of Target Heart Rate Range (THRR) Yes       Intervention Provide education and explanation of THRR including how the numbers were predicted and where they are located for reference       Expected Outcomes Short Term: Able to state/look up THRR;Short Term: Able to use daily as guideline for intensity in rehab;Long Term: Able to use THRR to govern intensity when exercising independently       Able to check pulse independently Yes       Intervention Provide education and demonstration on how to check pulse in carotid and radial arteries.;Review the importance of being able to check your own pulse for safety during independent exercise       Expected Outcomes Short Term: Able to explain why pulse checking is important during independent exercise;Long Term: Able to check pulse independently and accurately       Understanding of Exercise Prescription Yes       Intervention Provide education, explanation, and written materials on patient's individual exercise prescription       Expected Outcomes Short Term: Able to explain program exercise prescription;Long Term: Able to explain home exercise prescription to exercise independently

## 2022-11-19 NOTE — Progress Notes (Signed)
Cardiac Individual Treatment Plan  Patient Details  Name: Devin [name] [facility]. MRN: [id number] Date of Birth: 01/25/1946 Referring Provider:   Flowsheet Row Cardiac Rehab from 11/19/2022 in [facility] Cardiac and Pulmonary Rehab  Referring Provider Dr. Adrian Blackwater MD       Initial Encounter Date:  Flowsheet Row Cardiac Rehab from 11/19/2022 in [facility] Cardiac and Pulmonary Rehab  Date 11/19/22       Visit Diagnosis: NSTEMI (non-ST elevation myocardial infarction) [facility])  Status post coronary artery stent placement  Patient's Home Medications on Admission:  Current Outpatient Medications:    albuterol (VENTOLIN HFA) 108 (90 Base) MCG/ACT inhaler, Inhale 2 puffs into the lungs every 6 (six) hours as needed for wheezing or shortness of breath., Disp: , Rfl:    amiodarone (PACERONE) 200 MG tablet, Take 200 mg by mouth daily. (Patient not taking: Reported on 11/06/2022), Disp: , Rfl:    amLODipine (NORVASC) 10 MG tablet, Take 1 tablet by mouth daily., Disp: , Rfl:    apixaban (ELIQUIS) 2.5 MG TABS tablet, Take 1 tablet (2.5 mg total) by mouth 2 (two) times daily., Disp: 60 tablet, Rfl: 2   aspirin 81 MG chewable tablet, Chew 1 tablet (81 mg total) by mouth daily., Disp: 30 tablet, Rfl: 11   atorvastatin (LIPITOR) 80 MG tablet, Take 80 mg by mouth daily., Disp: , Rfl:    buPROPion (WELLBUTRIN XL) 300 MG 24 hr tablet, Take 300 mg by mouth daily., Disp: , Rfl:    cephALEXin (KEFLEX) 500 MG capsule, Take 500 mg by mouth 3 (three) times daily. (Patient not taking: Reported on 11/07/2022), Disp: , Rfl:    clopidogrel (PLAVIX) 75 MG tablet, Take 1 tablet (75 mg total) by mouth daily with breakfast., Disp: 30 tablet, Rfl: 11   EPINEPHrine 0.3 mg/0.3 mL IJ SOAJ injection, Inject 0.3 mg into the muscle as needed for anaphylaxis., Disp: , Rfl:    ezetimibe (ZETIA) 10 MG tablet, Take 10 mg by mouth daily., Disp: , Rfl:    fluticasone (FLONASE) 50 MCG/ACT nasal spray, SPRAY 2 SPRAYS INTO EACH NOSTRIL EVERY  DAY, Disp: , Rfl:    gabapentin (NEURONTIN) 300 MG capsule, Take 300 mg by mouth 2 (two) times daily., Disp: , Rfl:    loratadine (CLARITIN) 10 MG tablet, Take 10 mg by mouth daily., Disp: , Rfl:    metFORMIN (GLUCOPHAGE) 500 MG tablet, Take 1,000 mg by mouth 2 (two) times daily., Disp: , Rfl:    metoprolol succinate (TOPROL XL) 25 MG 24 hr tablet, Take 1 tablet (25 mg total) by mouth daily., Disp: 30 tablet, Rfl: 11   nitroGLYCERIN (NITROSTAT) 0.4 MG SL tablet, Place 0.4 mg under the tongue every 5 (five) minutes as needed for chest pain., Disp: , Rfl:    sacubitril-valsartan (ENTRESTO) 97-103 MG, Hold until followup with Dr. Welton Flakes due to low blood pressure. 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   Hypertension    Ischemic cardiomyopathy    MI, old  Peripheral neuropathy    PFO (patent foramen ovale) 05/2019   s/p repair   Sleep apnea    Spinal stenosis of lumbar region    TIA (transient ischemic attack)     Tobacco Use: Social History   Tobacco Use  Smoking Status Former  Smokeless Tobacco Never  Tobacco Comments   Quit over 40 years ago    Labs: Review Flowsheet  More data exists      Latest Ref Rng & Units 04/08/2020 07/04/2020 04/12/2022 04/13/2022 10/13/2022  Labs for ITP Cardiac and Pulmonary Rehab  Cholestrol 0 - 200 mg/dL - - - - 841   LDL (calc) 0 - 99 mg/dL - - - - 35   HDL-C >32 mg/dL - - - - 59   Trlycerides <150 mg/dL - - - - 55   Hemoglobin A1c 4.8 - 5.6 % 5.8  - - 5.5  6.3   Bicarbonate 20.0 - 28.0 mmol/L - - 23.9  - -  TCO2 22  - 32 mmol/L - 24  - - -  Acid-base deficit 0.0 - 2.0 mmol/L - - 0.1  - -  O2 Saturation % - - 90.3  - -    Details             Exercise Target Goals: Exercise Program Goal: Individual exercise prescription set using results from initial 6 min walk test and THRR while considering  patient's activity barriers and safety.   Exercise Prescription Goal: Initial exercise prescription builds to 30-45 minutes a day of aerobic activity, 2-3 days per week.  Home exercise guidelines will be given to patient during program as part of exercise prescription that the participant will acknowledge.   Education: Aerobic Exercise: - Group verbal and visual presentation on the components of exercise prescription. Introduces F.I.T.T principle from ACSM for exercise prescriptions.  Reviews F.I.T.T. principles of aerobic exercise including progression. Written material given at graduation. Flowsheet Row Cardiac Rehab from 11/19/2022 in [facility] Cardiac and Pulmonary Rehab  Education need identified 11/19/22       Education: Resistance Exercise: - Group verbal and visual presentation on the components of exercise prescription. Introduces F.I.T.T principle from ACSM for exercise prescriptions  Reviews F.I.T.T. principles of resistance exercise including progression. Written material given at graduation.    Education: Exercise & Equipment Safety: - Individual verbal instruction and demonstration of equipment use and safety with use of the equipment. Flowsheet Row Cardiac Rehab from 11/19/2022 in [facility] Cardiac and Pulmonary Rehab  Date 11/19/22  Educator NT  Instruction Review Code 1- Verbalizes Understanding       Education: Exercise Physiology & General Exercise Guidelines: - Group verbal and written instruction with models to review the exercise physiology of the cardiovascular system and associated critical values. Provides general exercise guidelines with specific guidelines to those with heart or lung  disease.  Flowsheet Row Cardiac Rehab from 10/07/2019 in [facility] Cardiac and Pulmonary Rehab  Date 09/23/19  Educator AS  Instruction Review Code 1- Verbalizes Understanding       Education: Flexibility, Balance, Mind/Body Relaxation: - Group verbal and visual presentation with interactive activity on the components of exercise prescription. Introduces F.I.T.T principle from ACSM for exercise prescriptions. Reviews F.I.T.T. principles of flexibility and balance exercise training including progression. Also discusses the mind body connection.  Reviews various relaxation techniques to help reduce and manage stress (i.e. Deep breathing, progressive muscle relaxation, and visualization). Balance handout provided to take home. Written material given at graduation.   Activity Barriers & Risk  Stratification:  Activity Barriers & Cardiac Risk Stratification - 11/19/22 1442       Activity Barriers & Cardiac Risk Stratification   Activity Barriers Right Knee Replacement;Left Knee Replacement;Muscular Weakness;Balance Concerns;Other (comment)    Comments drop foot, impaired gait, neuropathy    Cardiac Risk Stratification Moderate             6 Minute Walk:  6 Minute Walk     Row Name 11/19/22 1440         6 Minute Walk   Phase Initial     Distance 620 feet     Walk Time 6 minutes     # of Rest Breaks 0     MPH 1.17     METS 1.02     RPE 11     Perceived Dyspnea  0     VO2 Peak 3.59     Symptoms No     Resting HR 56 bpm     Resting BP 128/68     Resting Oxygen Saturation  97 %     Exercise Oxygen Saturation  during 6 min walk 96 %     Max Ex. HR 68 bpm     Max Ex. BP 138/76     2 Minute Post BP 136/70              Oxygen Initial Assessment:   Oxygen Re-Evaluation:   Oxygen Discharge (Final Oxygen Re-Evaluation):   Initial Exercise Prescription:  Initial Exercise Prescription - 11/19/22 1400       Date of Initial Exercise RX and Referring Provider   Date  11/19/22    Referring Provider Dr. Adrian Blackwater MD      Oxygen   Maintain Oxygen Saturation 88% or higher      Treadmill   MPH 1    Grade 0    Minutes 15    METs 1.8      NuStep   Level 1    SPM 80    Minutes 15    METs 1.02      Arm Ergometer   Level 1    RPM 50    Minutes 15    METs 1.02      Track   Laps 16    Minutes 15    METs 1.87      Prescription Details   Frequency (times per week) 2    Duration Progress to 30 minutes of continuous aerobic without signs/symptoms of physical distress      Intensity   THRR 40-80% of Max Heartrate 90-124    Ratings of Perceived Exertion 11-13    Perceived Dyspnea 0-4      Progression   Progression Continue to progress workloads to maintain intensity without signs/symptoms of physical distress.      Resistance Training   Training Prescription Yes    Weight 4 lb    Reps 10-15             Perform Capillary Blood Glucose checks as needed.  Exercise Prescription Changes:   Exercise Prescription Changes     Row Name 11/19/22 1400             Response to Exercise   Blood Pressure (Admit) 128/68       Blood Pressure (Exercise) 138/76       Blood Pressure (Exit) 136/70       Heart Rate (Admit) 56 bpm       Heart Rate (Exercise) 68 bpm  Heart Rate (Exit) 60 bpm       Oxygen Saturation (Admit) 97 %       Oxygen Saturation (Exercise) 96 %       Rating of Perceived Exertion (Exercise) 11       Perceived Dyspnea (Exercise) 0       Symptoms none       Comments Results                Exercise Comments:   Exercise Goals and Review:   Exercise Goals     Row Name 11/19/22 1442             Exercise Goals   Increase Physical Activity Yes       Intervention Provide advice, education, support and counseling about physical activity/exercise needs.;Develop an individualized exercise prescription for aerobic and resistive training based on initial evaluation findings, risk stratification,  comorbidities and participant's personal goals.       Expected Outcomes Long Term: Add in home exercise to make exercise part of routine and to increase amount of physical activity.;Long Term: Exercising regularly at least 3-5 days a week.;Short Term: Attend rehab on a regular basis to increase amount of physical activity.       Increase Strength and Stamina Yes       Intervention Provide advice, education, support and counseling about physical activity/exercise needs.;Develop an individualized exercise prescription for aerobic and resistive training based on initial evaluation findings, risk stratification, comorbidities and participant's personal goals.       Expected Outcomes Short Term: Increase workloads from initial exercise prescription for resistance, speed, and METs.;Short Term: Perform resistance training exercises routinely during rehab and add in resistance training at home;Long Term: Improve cardiorespiratory fitness, muscular endurance and strength as measured by increased METs and functional capacity ( )       Able to understand and use rate of perceived exertion (RPE) scale Yes       Intervention Provide education and explanation on how to use RPE scale       Expected Outcomes Short Term: Able to use RPE daily in rehab to express subjective intensity level;Long Term:  Able to use RPE to guide intensity level when exercising independently       Able to understand and use Dyspnea scale Yes       Intervention Provide education and explanation on how to use Dyspnea scale       Expected Outcomes Short Term: Able to use Dyspnea scale daily in rehab to express subjective sense of shortness of breath during exertion;Long Term: Able to use Dyspnea scale to guide intensity level when exercising independently       Knowledge and understanding of Target Heart Rate Range (THRR) Yes       Intervention Provide education and explanation of THRR including how the numbers were predicted and where they  are located for reference       Expected Outcomes Short Term: Able to state/look up THRR;Short Term: Able to use daily as guideline for intensity in rehab;Long Term: Able to use THRR to govern intensity when exercising independently       Able to check pulse independently Yes       Intervention Provide education and demonstration on how to check pulse in carotid and radial arteries.;Review the importance of being able to check your own pulse for safety during independent exercise       Expected Outcomes Short Term: Able to explain why pulse checking is important during  independent exercise;Long Term: Able to check pulse independently and accurately       Understanding of Exercise Prescription Yes       Intervention Provide education, explanation, and written materials on patient's individual exercise prescription       Expected Outcomes Short Term: Able to explain program exercise prescription;Long Term: Able to explain home exercise prescription to exercise independently                Exercise Goals Re-Evaluation :   Discharge Exercise Prescription (Final Exercise Prescription Changes):  Exercise Prescription Changes - 11/19/22 1400       Response to Exercise   Blood Pressure (Admit) 128/68    Blood Pressure (Exercise) 138/76    Blood Pressure (Exit) 136/70    Heart Rate (Admit) 56 bpm    Heart Rate (Exercise) 68 bpm    Heart Rate (Exit) 60 bpm    Oxygen Saturation (Admit) 97 %    Oxygen Saturation (Exercise) 96 %    Rating of Perceived Exertion (Exercise) 11    Perceived Dyspnea (Exercise) 0    Symptoms none    Comments Results             Nutrition:  Target Goals: Understanding of nutrition guidelines, daily intake of sodium 1500mg , cholesterol 200mg , calories 30% from fat and 7% or less from saturated fats, daily to have 5 or more servings of fruits and vegetables.  Education: All About Nutrition: -Group instruction provided by verbal, written material,  interactive activities, discussions, models, and posters to present general guidelines for heart healthy nutrition including fat, fiber, MyPlate, the role of sodium in heart healthy nutrition, utilization of the nutrition label, and utilization of this knowledge for meal planning. Follow up email sent as well. Written material given at graduation. Flowsheet Row Cardiac Rehab from 11/19/2022 in [facility] Cardiac and Pulmonary Rehab  Education need identified 11/19/22       Biometrics:  Pre Biometrics - 11/19/22 1443       Pre Biometrics   Height 5' 11.5" (1.816 m)    Weight 205 lb 3.2 oz (93.1 kg)    Waist Circumference 43 inches    Hip Circumference 42 inches    Waist to Hip Ratio 1.02 %    BMI (Calculated) 28.22    Single Leg Stand 1.2 seconds              Nutrition Therapy Plan and Nutrition Goals:  Nutrition Therapy & Goals - 11/19/22 1430       Intervention Plan   Intervention Prescribe, educate and counsel regarding individualized specific dietary modifications aiming towards targeted core components such as weight, hypertension, lipid management, diabetes, heart failure and other comorbidities.    Expected Outcomes Short Term Goal: Understand basic principles of dietary content, such as calories, fat, sodium, cholesterol and nutrients.;Short Term Goal: A plan has been developed with personal nutrition goals set during dietitian appointment.;Long Term Goal: Adherence to prescribed nutrition plan.             Nutrition Assessments:  MEDIFICTS Score Key: ?70 Need to make dietary changes  40-70 Heart Healthy Diet ? 40 Therapeutic Level Cholesterol Diet  Flowsheet Row Cardiac Rehab from 11/19/2022 in [facility] Cardiac and Pulmonary Rehab  Picture Your Plate Total Score on Admission 72      Picture Your Plate Scores: <16 Unhealthy dietary pattern with much room for improvement. 41-50 Dietary pattern unlikely to meet recommendations for good health and room for  improvement. 51-60 More  healthful dietary pattern, with some room for improvement.  >60 Healthy dietary pattern, although there may be some specific behaviors that could be improved.    Nutrition Goals Re-Evaluation:   Nutrition Goals Discharge (Final Nutrition Goals Re-Evaluation):   Psychosocial: Target Goals: Acknowledge presence or absence of significant depression and/or stress, maximize coping skills, provide positive support system. Participant is able to verbalize types and ability to use techniques and skills needed for reducing stress and depression.   Education: Stress, Anxiety, and Depression - Group verbal and visual presentation to define topics covered.  Reviews how body is impacted by stress, anxiety, and depression.  Also discusses healthy ways to reduce stress and to treat/manage anxiety and depression.  Written material given at graduation.   Education: Sleep Hygiene -Provides group verbal and written instruction about how sleep can affect your health.  Define sleep hygiene, discuss sleep cycles and impact of sleep habits. Review good sleep hygiene tips.    Initial Review & Psychosocial Screening:  Initial Psych Review & Screening - 11/07/22 1003       Initial Review   Current issues with None Identified      Family Dynamics   Good Support System? Yes   lady friend     Barriers   Psychosocial barriers to participate in program There are no identifiable barriers or psychosocial needs.      Screening Interventions   Interventions Encouraged to exercise;To provide support and resources with identified psychosocial needs;Provide feedback about the scores to participant    Expected Outcomes Short Term goal: Utilizing psychosocial counselor, staff and physician to assist with identification of specific Stressors or current issues interfering with healing process. Setting desired goal for each stressor or current issue identified.;Long Term Goal: Stressors or current  issues are controlled or eliminated.;Short Term goal: Identification and review with participant of any Quality of Life or Depression concerns found by scoring the questionnaire.;Long Term goal: The participant improves quality of Life and PHQ9 Scores as seen by post scores and/or verbalization of changes             Quality of Life Scores:   Quality of Life - 11/19/22 1434       Quality of Life   Select Quality of Life      Quality of Life Scores   Health/Function Pre 22.37 %    Socioeconomic Pre 24.07 %    Psych/Spiritual Pre 22 %    Family Pre 30 %    GLOBAL Pre 23.2 %            Scores of 19 and below usually indicate a poorer quality of life in these areas.  A difference of  2-3 points is a clinically meaningful difference.  A difference of 2-3 points in the total score of the Quality of Life Index has been associated with significant improvement in overall quality of life, self-image, physical symptoms, and general health in studies assessing change in quality of life.  PHQ-9: Review Flowsheet  More data may exist      11/19/2022 01/12/2020 10/28/2019 09/09/2019 07/02/2019  Depression screen PHQ 2/9  Decreased Interest 1 3 2 1 2   Down, Depressed, Hopeless 1 0 0 0 1  PHQ - 2 Score 2 3 2 1 3   Altered sleeping 1 1 3 3 3   Tired, decreased energy 1 2 2  0 2  Change in appetite 1 1 1  0 1  Feeling bad or failure about yourself  0 0 0 0 0  Trouble concentrating 0 0 1 0 0  Moving slowly or fidgety/restless 0 0 1 0 0  Suicidal thoughts 0 1 0 0 0  PHQ-9 Score 5 8 10 4 9   Difficult doing work/chores Somewhat difficult Not difficult at all Somewhat difficult Not difficult at all Not difficult at all    Details           Interpretation of Total Score  Total Score Depression Severity:  1-4 = Minimal depression, 5-9 = Mild depression, 10-14 = Moderate depression, 15-19 = Moderately severe depression, 20-27 = Severe depression   Psychosocial Evaluation and Intervention:   Psychosocial Evaluation - 11/07/22 1020       Psychosocial Evaluation & Interventions   Comments Ed has no barriers to attending the prgram. He has been in program before and wants to work on his leg strength and his balance. He is in PT at this time for balance concerns. He states no concerns with stress. He is working with his physician over his medication. He is ready to start    Expected Outcomes STGAttend all scheduled sessions, work on exercise progression while concentrating on leg strength and improved balance LTG Continues working on exercie progression after discharge    Continue Psychosocial Services  Follow up required by staff             Psychosocial Re-Evaluation:   Psychosocial Discharge (Final Psychosocial Re-Evaluation):   Vocational Rehabilitation: Provide vocational rehab assistance to qualifying candidates.   Vocational Rehab Evaluation & Intervention:  Vocational Rehab - 11/07/22 1019       Initial Vocational Rehab Evaluation & Intervention   Assessment shows need for Vocational Rehabilitation No      Vocational Rehab Re-Evaulation   Comments retired             Education: Education Goals: Education classes will be provided on a variety of topics geared toward better understanding of heart health and risk factor modification. Participant will state understanding/return demonstration of topics presented as noted by education test scores.  Learning Barriers/Preferences:  Learning Barriers/Preferences - 11/07/22 1008       Learning Barriers/Preferences   Learning Barriers None    Learning Preferences None             General Cardiac Education Topics:  AED/CPR: - Group verbal and written instruction with the use of models to demonstrate the basic use of the AED with the basic ABC's of resuscitation.   Anatomy and Cardiac Procedures: - Group verbal and visual presentation and models provide information about basic cardiac anatomy and  function. Reviews the testing methods done to diagnose heart disease and the outcomes of the test results. Describes the treatment choices: Medical Management, Angioplasty, or Coronary Bypass Surgery for treating various heart conditions including Myocardial Infarction, Angina, Valve Disease, and Cardiac Arrhythmias.  Written material given at graduation. Flowsheet Row Cardiac Rehab from 11/19/2022 in [facility] Cardiac and Pulmonary Rehab  Education need identified 11/19/22       Medication Safety: - Group verbal and visual instruction to review commonly prescribed medications for heart and lung disease. Reviews the medication, class of the drug, and side effects. Includes the steps to properly store meds and maintain the prescription regimen.  Written material given at graduation. Flowsheet Row Cardiac Rehab from 10/07/2019 in [facility] Cardiac and Pulmonary Rehab  Date 10/07/19  Educator SB  Instruction Review Code 1- Verbalizes Understanding       Intimacy: - Group verbal instruction through game format to discuss how  heart and lung disease can affect sexual intimacy. Written material given at graduation..   Know Your Numbers and Heart Failure: - Group verbal and visual instruction to discuss disease risk factors for cardiac and pulmonary disease and treatment options.  Reviews associated critical values for Overweight/Obesity, Hypertension, Cholesterol, and Diabetes.  Discusses basics of heart failure: signs/symptoms and treatments.  Introduces Heart Failure Zone chart for action plan for heart failure.  Written material given at graduation.   Infection Prevention: - Provides verbal and written material to individual with discussion of infection control including proper hand washing and proper equipment cleaning during exercise session. Flowsheet Row Cardiac Rehab from 11/19/2022 in [facility] Cardiac and Pulmonary Rehab  Date 11/19/22  Educator NT  Instruction Review Code 1- Verbalizes Understanding        Falls Prevention: - Provides verbal and written material to individual with discussion of falls prevention and safety. Flowsheet Row Cardiac Rehab from 11/19/2022 in [facility] Cardiac and Pulmonary Rehab  Date 11/19/22  Educator NT  Instruction Review Code 1- Verbalizes Understanding       Other: -Provides group and verbal instruction on various topics (see comments)   Knowledge Questionnaire Score:  Knowledge Questionnaire Score - 11/19/22 1429       Knowledge Questionnaire Score   Pre Score 21/26             Core Components/Risk Factors/Patient Goals at Admission:  Personal Goals and Risk Factors at Admission - 11/07/22 1008       Core Components/Risk Factors/Patient Goals on Admission    Weight Management Yes;Weight Maintenance    Intervention Weight Management: Develop a combined nutrition and exercise program designed to reach desired caloric intake, while maintaining appropriate intake of nutrient and fiber, sodium and fats, and appropriate energy expenditure required for the weight goal.    Admit Weight 204 lb (92.5 kg)    Goal Weight: Long Term 204 lb (92.5 kg)    Expected Outcomes Short Term: Continue to assess and modify interventions until short term weight is achieved;Long Term: Adherence to nutrition and physical activity/exercise program aimed toward attainment of established weight goal;Weight Maintenance: Understanding of the daily nutrition guidelines, which includes 25-35% calories from fat, 7% or less cal from saturated fats, less than 200mg  cholesterol, less than 1.5gm of sodium, & 5 or more servings of fruits and vegetables daily    Diabetes Yes    Intervention Provide education about signs/symptoms and action to take for hypo/hyperglycemia.;Provide education about proper nutrition, including hydration, and aerobic/resistive exercise prescription along with prescribed medications to achieve blood glucose in normal ranges: Fasting glucose 65-99 mg/dL     Expected Outcomes Short Term: Participant verbalizes understanding of the signs/symptoms and immediate care of hyper/hypoglycemia, proper foot care and importance of medication, aerobic/resistive exercise and nutrition plan for blood glucose control.;Long Term: Attainment of HbA1C < 7%.    Heart Failure Yes    Intervention Provide a combined exercise and nutrition program that is supplemented with education, support and counseling about heart failure. Directed toward relieving symptoms such as shortness of breath, decreased exercise tolerance, and extremity edema.    Expected Outcomes Improve functional capacity of life;Short term: Attendance in program 2-3 days a week with increased exercise capacity. Reported lower sodium intake. Reported increased fruit and vegetable intake. Reports medication compliance.;Short term: Daily weights obtained and reported for increase. Utilizing diuretic protocols set by physician.;Long term: Adoption of self-care skills and reduction of barriers for early signs and symptoms recognition and intervention leading to self-care  maintenance.    Hypertension Yes    Intervention Provide education on lifestyle modifcations including regular physical activity/exercise, weight management, moderate sodium restriction and increased consumption of fresh fruit, vegetables, and low fat dairy, alcohol moderation, and smoking cessation.;Monitor prescription use compliance.    Expected Outcomes Short Term: Continued assessment and intervention until BP is < 140/80mm HG in hypertensive participants. < 130/83mm HG in hypertensive participants with diabetes, heart failure or chronic kidney disease.;Long Term: Maintenance of blood pressure at goal levels.    Lipids Yes    Intervention Provide education and support for participant on nutrition & aerobic/resistive exercise along with prescribed medications to achieve LDL 70mg , HDL >40mg .    Expected Outcomes Short Term: Participant states  understanding of desired cholesterol values and is compliant with medications prescribed. Participant is following exercise prescription and nutrition guidelines.;Long Term: Cholesterol controlled with medications as prescribed, with individualized exercise RX and with personalized nutrition plan. Value goals: LDL < 70mg , HDL > 40 mg.             Education:Diabetes - Individual verbal and written instruction to review signs/symptoms of diabetes, desired ranges of glucose level fasting, after meals and with exercise. Acknowledge that pre and post exercise glucose checks will be done for 3 sessions at entry of program. Flowsheet Row Cardiac Rehab from 01/13/2020 in [facility]  Date 01/13/20  Educator [facility]  Instruction Review Code 1- Verbalizes Understanding       Core Components/Risk Factors/Patient Goals Review:    Core Components/Risk Factors/Patient Goals at Discharge (Final Review):    ITP Comments:  ITP Comments     Row Name 11/07/22 1019 11/19/22 1426         ITP Comments Virtual orientation call completed today. he has an appointment on Date: [id number]  for EP eval and gym Orientation.  Documentation of diagnosis can be found in [facility]  Date: 10/13/2022  Letter sent for AAA parameters. Completed and gym orientation. Initial ITP created and sent for review to Dr. Daniel Nones, Medical Director.               Comments: Initial ITP

## 2022-11-19 NOTE — Therapy (Signed)
OUTPATIENT PHYSICAL THERAPY LOWER EXTREMITY TREATMENT   Patient Name: Devin [name] [facility]. MRN: [id number] DOB:1946-02-15, 77 y.o., male Today's Date: 11/19/2022  END OF SESSION:  PT End of Session - 11/19/22 1234     Visit Number 8    Number of Visits 10    Date for PT Re-Evaluation 12/04/22    Authorization Type 2x/week x 6 weeks (re-cert/PN 2/72/53)    PT Start Time 1220    PT Stop Time 1245    PT Time Calculation (min) 25 min    Activity Tolerance Patient tolerated treatment well    Behavior During Therapy WFL for tasks assessed/performed               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   Hypertension    Ischemic cardiomyopathy    MI, old    Peripheral neuropathy    PFO (patent foramen ovale) 05/2019   s/p repair   Sleep apnea    Spinal stenosis of lumbar region    TIA (transient ischemic attack)    Past Surgical History:  Procedure Laterality Date   APPENDECTOMY     BREAST SURGERY Right    benign mass   CARDIOVERSION Right 12/15/2019   Procedure: CARDIOVERSION;  Surgeon: Laurier Nancy, MD;  Location: [facility] ORS;  Service: Cardiovascular;  Laterality: Right;   CATARACT EXTRACTION, BILATERAL     COLONOSCOPY     CORONARY ANGIOPLASTY WITH STENT PLACEMENT     CORONARY ARTERY BYPASS GRAFT  05/2019   LIMA-LAD   CORONARY STENT INTERVENTION N/A 10/15/2022   Procedure: CORONARY STENT INTERVENTION;  Surgeon: Marcina Millard, MD;  Location: [facility] INVASIVE CV LAB;  Service: Cardiovascular;  Laterality: N/A;   LEFT HEART CATH N/A 10/15/2022   Procedure: Left Heart Cath;  Surgeon: Marcina Millard, MD;   Location: [facility] INVASIVE CV LAB;  Service: Cardiovascular;  Laterality: N/A;   LEFT HEART CATH AND CORS/GRAFTS ANGIOGRAPHY N/A 05/21/2019   Procedure: LEFT HEART CATH AND CORONARY ANGIOGRAPHY;  Surgeon: Laurier Nancy, MD;  Location: [facility] INVASIVE CV LAB;  Service: Cardiovascular;  Laterality: N/A;   LUMBAR LAMINECTOMY/DECOMPRESSION MICRODISCECTOMY N/A 07/04/2020   Procedure: L4-5 LAMINECTOMY;  Surgeon: Lucy Chris, MD;  Location: [facility] ORS;  Service: Neurosurgery;  Laterality: N/A;   REPLACEMENT TOTAL KNEE BILATERAL     Patient Active Problem List   Diagnosis Date Noted   NSTEMI (non-ST elevated myocardial infarction) (HCC) 10/13/2022   HFrEF (heart failure with reduced ejection fraction) (HCC) 10/13/2022   Degenerative joint disease (DJD) of lumbar spine 10/12/2022   Frequent falls 05/15/2022   Unsteady gait 04/16/2022   Acute delirium 04/13/2022   Electrolyte abnormality 04/13/2022   Nausea & vomiting 04/13/2022   PAD (peripheral artery disease) (HCC) 09/03/2021   Localized, primary osteoarthritis 11/24/2020   Wound healing, delayed 09/08/2020   History of lumbar surgery 09/08/2020   Chronic ischemic heart disease 07/13/2020   History of MI (myocardial infarction) 07/13/2020   History of PTCA 1 07/13/2020   Other personal history presenting hazards to health 07/13/2020   Pure hypercholesterolemia  07/13/2020   Elevated lactic acid level 04/19/2020   Generalized weakness 04/09/2020   Fall at home 04/09/2020   Gastroenteritis 04/09/2020   AMS (altered mental status) 04/08/2020   Hypoxia 02/01/2020   Atrial fibrillation status post cardioversion (HCC) 12/22/2019   Congestive heart failure (CHF) (HCC) 12/13/2019   Hyponatremia 12/06/2019   Acute postoperative anemia due to expected blood loss 05/28/2019   Hyperlipidemia 05/28/2019   Ischemic cardiomyopathy 05/28/2019   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   Unstable angina (HCC) 05/24/2019   Chest pain 05/21/2019    Ischemic chest pain (HCC) 05/16/2019   CAD (coronary artery disease) 05/16/2019   HTN (hypertension) 05/16/2019   Depression 05/16/2019   GERD (gastroesophageal reflux disease) 05/16/2019   Verruca plantaris 01/23/2018   Obstructive sleep apnea syndrome 01/20/2018   Major depressive disorder, single episode, moderate (HCC) 07/05/2016   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 Zada Finders, MD  REFERRING PROVIDER: Dr. Zada Finders, MD/ [name], [facility]  REFERRING DIAG:  R26.9 (ICD-10-CM) - Unspecified abnormalities of gait and mobility  R26.89 (ICD-10-CM) - Other abnormalities of gait and mobility  R29.6 (ICD-10-CM) - Repeated falls    THERAPY DIAG:  Muscle weakness (generalized)  Balance problem  Rationale for Evaluation and Treatment: Rehabilitation  ONSET DATE: at least 2 years  SUBJECTIVE: (From initial evaluation note)  SUBJECTIVE STATEMENT: Pt reports he is having difficulty with gait and balance.  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   He feels like his balance and walking difficulty have worsened recently.  He reports he started falling frequently, he was catching his foot on things and tripping.  The most recent major fall was about 6 months ago.  He is being careful since then and trying to walk more to get stronger and trying to be more aware of his gait pattern.  He was walking with a walker until ~4 months ago, then started using a cane, and then has tried walking without any assistance.      He feels like his difficulty walking limits his ability to get out of the house as much as he used to because he  is fearful of falling.  PLOF- he enjoys getting out of the house for social activities, going out to breakfast too; going grocery shopping a few times/week to get his groceries; prior to his back surgery he walked at the park in [place], hasn't been back though because he is worried about falling though.  He has a home aide that comes a few days a week to assist with household tasks as needed.  Also reports he had a MI a little over a week ago and was in the hospital (8/3-10/16/22)- he states he is referred to begin cardiac rehab as part of his outpatient recovery.  PERTINENT HISTORY: Extensive cardiac history History of lumbar spine surgery- 2022 L4-5 microdiscectomy/laminectomy   PAIN:  Are you having pain? R groin muscle area where he had the cardiac catheterization procedure last week.     PRECAUTIONS: Fall  RED FLAGS: Pt with recent MI (10/13/22) with stent placement (see chart review)  WEIGHT BEARING RESTRICTIONS: No  FALLS:  Has patient fallen in last 6 months? Yes. Number of falls multiple  LIVING ENVIRONMENT: Lives with: lives alone (has a self reported "lady friend" for social support and enjoys being able to get out the house for community activities, going to restaurants together) Lives in: House/apartment Stairs:  he lives in a single level home with bonus room over the garage, but he does not need to go upstairs.1 step to enter in the garage- able to get in without falling. Has following equipment at home: Single point cane, Walker - 4 wheeled, and Wheelchair (manual) Has a home health aide that comes 2-3x/week if he needs assistance  OCCUPATION: retired  PLOF: Independent  PATIENT GOALS: to address drop foot and be able to manage it better; to strengthen his legs; and to increase his breathing; and improve his balance   NEXT MD VISIT: yes, next week   OBJECTIVE:   DIAGNOSTIC FINDINGS: no recent lower extremity/spine imaging  PATIENT SURVEYS:  FOTO  39/52  COGNITION: Overall cognitive status: Within functional limits for tasks assessed     SENSATION: Pt reports reduced sensation to light touch L L4-5 dermatomes  EDEMA:  No pitting edema noted on dorsum of foot   POSTURE: pt stands with wide base of support for balance; uses UE support on LE's for sit to stand transfer  PALPATION: (-) TTP distal LE b/l  LOWER EXTREMITY ROM:  Active ROM Right eval Left eval  Hip flexion WNL WNL  Hip extension    Hip abduction    Hip adduction    Hip internal rotation    Hip external rotation    Knee flexion 105 105  Knee extension -5 -5  Ankle dorsiflexion 5 deg PROM 0 deg  Ankle plantarflexion    Great toe extension 45 deg PROM 40 deg   Ankle eversion     (Blank rows = not tested)  LOWER EXTREMITY MMT:  MMT Right eval Left eval  Hip flexion 4 4  Hip extension    Hip abduction    Hip adduction    Hip internal rotation    Hip external rotation    Knee flexion 5 5  Knee extension 4 4  Ankle dorsiflexion 4/5 1/5  Ankle plantarflexion 3/5 3/5  Great toe extension 4/5 2/5  Ankle eversion 4/5 3/5   (Blank rows = not tested)   FUNCTIONAL TESTS:  OUTCOME MEASURES: TEST Outcome Interpretation  5 times sit<>stand 21 seconds >60 yo, >15 sec indicates increased risk for falls  10 meter walk test deferred <1.0 m/s indicates increased risk for falls; limited community ambulator  [employer] Assessment /56   deferred <36/56 (100% risk for falls), 37-45 (80% risk for falls); 46-51 (>50% risk for falls); 52-55 (lower risk <25% of falls)      GAIT: Distance walked: from waiting area into PT gym 50 ft, PT supervision Assistive device utilized: None Level of assistance: Complete Independence Comments: Pt ambulates with decreased gait speed, wide BOS, decreased stride length, excess L hip flexion strategy to compensate for L foot drop  Pt not able to stand SLS on L without UE support today; able to stand R LE x 5  seconds  TODAY'S TREATMENT:  DATE: 11/19/22  Subjective: Patient reports he has his first cardiac rehab appointment next week.  He feels like he is walking a little better since starting PT.  He feels like he can walk a little without his [facility] for short distances with better balance.  Pain: 0/10  Objective: Vitals upon arrival SPO2: 96% HR: 67 sitting 153/73 BP Focused on RPE in "light activity" for session   Therapeutic Exercises: Sit to stand x 5, 2 sets at start of session from elevated table without B UE support   Seated marches: 3# AW 2x10  Seated knee extension: 3# AW 2x10   Seated hip adduction ball squeeze 2 x 10 with 3 second hold  Standing heel raises: 2x10  Alternating toe taps : 6 inch height, 2 x5 R LE, 2x5 L LE with finger tip support on railing, emphasis on hip flexion strategy due to L foot drop  Standing hip abduction 3# AW x 10 each LE   Step ups onto 6 inch step: with 2 finger support ea UE; x10 L and x10 R  SLS balance: 5x 20 second trials on L and R with single UE support and removing hand briefly- more steady on R  HR 64 after exercise, SPO2 99%  Pt ed for RPE scale, purpose of RPE to keep at light activity level while waiting to begin cardiac rehab  Amb with patient out to his car after session; observed he is amb with less of a wide base of support, improved push off at terminal stance observed bilaterally; overall improved sagittal plane movement and less compensatory frontal plane motion in trunk and lower extremities noted   Not today:  -Hurdles: forward direction in // bars (PT supervision with gait belt) 6 laps, focused on hip flexion strategy for foot clearance; attempted cue for "lift toes" but pt not able to actively perform active DF of ankle against gravity -Side step over hurdles in // bars: 6 laps -Airex: feet  together, 3 trials x 30-40 seconds with removing hand support -Airex: tandem stance, 3 trials each R in front/L in front x 20-30 seconds removing hand support (pt notes ankle/lower leg fatigue after this) -Front step up onto airex: x15 leading with R LE and x15 leading with L LE, b/l UE support   PATIENT EDUCATION:  Education details: PT POC/goals, importance of attending cardiac rehab, activity pacing Person educated: Patient Education method: Explanation Education comprehension: verbalized understanding and needs further education  HOME EXERCISE PROGRAM: Access Code: [email or web address] URL: [email or web address] Date: 10/24/2022 Prepared by: Max Fickle  Exercises - Seated Heel Raise  - 1 x daily - 3 x weekly - 2 sets - 20 reps - Seated March  - 1 x daily - 3 x weekly - 2 sets - 10 reps - Seated Long Arc Quad  - 1 x daily - 3 x weekly - 2 sets - 10 reps  ASSESSMENT:  CLINICAL IMPRESSION:  Patient amb with slightly improved gait pattern on flat surfaces in the clinic.  Advised him to continue using his [facility] for safety when he amb longer distances, especially without AFO or ankle DF brace.  He begins cardiac rehab sessions next week.  Will adjust PT schedule accordingly.  Overall, he tolerated session well today.  Patient will continue to benefit from skilled PT intervention to address listed impairments to decrease fall risk and improve overall mobility.   OBJECTIVE IMPAIRMENTS: Abnormal gait, cardiopulmonary status limiting activity, decreased activity tolerance, decreased balance, decreased mobility, difficulty walking, decreased ROM,  and decreased strength.   ACTIVITY LIMITATIONS: standing, squatting, stairs, transfers, and locomotion level  PARTICIPATION LIMITATIONS: meal prep, cleaning, laundry, interpersonal relationship, shopping, and community activity  PERSONAL FACTORS: Age, Past/current experiences, Time since onset of injury/illness/exacerbation, and cardiovascular  hx including recent MI with stent placement last week  are also affecting patient's functional outcome.   REHAB POTENTIAL: Good  CLINICAL DECISION MAKING: Stable/uncomplicated  EVALUATION COMPLEXITY: Low   GOALS: Goals reviewed with patient? Yes  SHORT TERM GOALS: Target date: 11/11/22 Pt will be able to perform a HEP for LE strengthening >3x/week Baseline: Goal status: INITIAL   LONG TERM GOALS: Target date: 12/04/22  Improve FOTO to >51 indicating pt able to perform his daily activities without being limited by his balance Baseline: 39 Goal status: INITIAL  2.  Pt will be able to amb with typical width BOS and b/l heel strike gait pattern using least restrictive AD and AFO on flat/incline/decline/grass surfaces x 10 min Baseline: not using any AD or AFO in clinic, pt wide BOS, no L heel strike due to foot drop Goal status: INITIAL  3.  Pt will be able to perform 5x STS in <15 sec indicating reduced risk for falling Baseline: 21 sec Goal status: INITIAL  PLAN:  PT FREQUENCY: 2x/week  PT DURATION: 6 weeks  PLANNED INTERVENTIONS: Therapeutic exercises, Therapeutic activity, Neuromuscular re-education, Balance training, Gait training, Patient/Family education, Self Care, Joint mobilization, and Orthotic/Fit training  PLAN FOR NEXT SESSION: continue to assess BP and vitals, continue low intensity LE strength/balance  Max Fickle, PT, DPT, OCS  Physical Therapist - [facility]   Ardine Bjork, PT 11/19/2022, 12:35 PM

## 2022-11-20 ENCOUNTER — Encounter: Payer: Self-pay | Admitting: Cardiovascular Disease

## 2022-11-20 ENCOUNTER — Other Ambulatory Visit: Payer: Self-pay

## 2022-11-21 ENCOUNTER — Ambulatory Visit: Payer: Medicare HMO

## 2022-11-21 DIAGNOSIS — M6281 Muscle weakness (generalized): Secondary | ICD-10-CM | POA: Diagnosis not present

## 2022-11-21 DIAGNOSIS — R2689 Other abnormalities of gait and mobility: Secondary | ICD-10-CM

## 2022-11-21 NOTE — Therapy (Signed)
OUTPATIENT PHYSICAL THERAPY LOWER EXTREMITY TREATMENT   Patient Name: Devin [name] [facility]. MRN: [id number] DOB:May 03, 1945, 77 y.o., male Today's Date: 11/22/2022  END OF SESSION:  PT End of Session - 11/21/22 1205     Visit Number 9    Number of Visits 10    Date for PT Re-Evaluation 12/04/22    Authorization Type 2x/week x 6 weeks (re-cert/PN 06/19/79)    PT Start Time 1205    PT Stop Time 1245    PT Time Calculation (min) 40 min    Activity Tolerance Patient tolerated treatment well    Behavior During Therapy WFL for tasks assessed/performed               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   Hypertension    Ischemic cardiomyopathy    MI, old    Peripheral neuropathy    PFO (patent foramen ovale) 05/2019   s/p repair   Sleep apnea    Spinal stenosis of lumbar region    TIA (transient ischemic attack)    Past Surgical History:  Procedure Laterality Date   APPENDECTOMY     BREAST SURGERY Right    benign mass   CARDIOVERSION Right 12/15/2019   Procedure: CARDIOVERSION;  Surgeon: Laurier Nancy, MD;  Location: [facility] ORS;  Service: Cardiovascular;  Laterality: Right;   CATARACT EXTRACTION, BILATERAL     COLONOSCOPY     CORONARY ANGIOPLASTY WITH STENT PLACEMENT     CORONARY ARTERY BYPASS GRAFT  05/2019   LIMA-LAD   CORONARY STENT INTERVENTION N/A 10/15/2022   Procedure: CORONARY STENT INTERVENTION;  Surgeon: Marcina Millard, MD;  Location: [facility] IN[facility]ASIVE CV [facility];  Service: Cardiovascular;  Laterality: N/A;   LEFT HEART CATH N/A 10/15/2022   Procedure: Left Heart Cath;  Surgeon: Marcina Millard, MD;   Location: [facility] INV[facility] CV [facility];  Service: Cardiovascular;  Laterality: N/A;   LEFT HEART CATH AND CORS/GRAFTS ANGIOGRAPHY N/A 05/21/2019   Procedure: LEFT HEART CATH AND CORONARY ANGIOGRAPHY;  Surgeon: Laurier Nancy, MD;  Location: [facility] IN[facility];  Service: Cardiovascular;  Laterality: N/A;   LUMBAR LAMINECTOMY/DECOMPRESSION MICRODISCECTOMY N/A 07/04/2020   Procedure: L4-5 LAMINECTOMY;  Surgeon: Lucy Chris, MD;  Location: [facility] ORS;  Service: Neurosurgery;  Laterality: N/A;   REPLACEMENT TOTAL KNEE BILATERAL     Patient Active Problem List   Diagnosis Date Noted   NSTEMI (non-ST elevated myocardial infarction) (HCC) 10/13/2022   HFrEF (heart failure with reduced ejection fraction) (HCC) 10/13/2022   Degenerative joint disease (DJD) of lumbar spine 10/12/2022   Frequent falls 05/15/2022   Unsteady gait 04/16/2022   Acute delirium 04/13/2022   Electrolyte abnormality 04/13/2022   Nausea & vomiting 04/13/2022   PAD (peripheral artery disease) (HCC) 09/03/2021   Localized, primary osteoarthritis 11/24/2020   Wound healing, delayed 09/08/2020   History of lumbar surgery 09/08/2020   Chronic ischemic heart disease 07/13/2020   History of MI (myocardial infarction) 07/13/2020   History of PTCA 1 07/13/2020   Other personal history presenting hazards to health 07/13/2020   Pure hypercholesterolemia  07/13/2020   Elevated lactic acid level 04/19/2020   Generalized weakness 04/09/2020   Fall at home 04/09/2020   Gastroenteritis 04/09/2020   AMS (altered mental status) 04/08/2020   Hypoxia 02/01/2020   Atrial fibrillation status post cardioversion (HCC) 12/22/2019   Congestive heart failure (CHF) (HCC) 12/13/2019   Hyponatremia 12/06/2019   Acute postoperative anemia due to expected blood loss 05/28/2019   Hyperlipidemia 05/28/2019   Ischemic cardiomyopathy 05/28/2019   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   Unstable angina (HCC) 05/24/2019   Chest pain 05/21/2019    Ischemic chest pain (HCC) 05/16/2019   CAD (coronary artery disease) 05/16/2019   HTN (hypertension) 05/16/2019   Depression 05/16/2019   GERD (gastroesophageal reflux disease) 05/16/2019   Verruca plantaris 01/23/2018   Obstructive sleep apnea syndrome 01/20/2018   Major depressive disorder, single episode, moderate (HCC) 07/05/2016   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 Zada Finders, MD  REFERRING PROVIDER: Dr. Zada Finders, MD/ [name], [facility]  REFERRING DIAG:  R26.9 (ICD-10-CM) - Unspecified abnormalities of gait and mobility  R26.89 (ICD-10-CM) - Other abnormalities of gait and mobility  R29.6 (ICD-10-CM) - Repeated falls    THERAPY DIAG:  Muscle weakness (generalized)  Balance problem  Other abnormalities of gait and mobility  Rationale for Evaluation and Treatment: Rehabilitation  ONSET DATE: at least 2 years  SUBJECTIVE: (From initial evaluation note)  SUBJECTIVE STATEMENT: Pt reports he is having difficulty with gait and balance.  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   He feels like his balance and walking difficulty have worsened recently.  He reports he started falling frequently, he was catching his foot on things and tripping.  The most recent major fall was about 6 months ago.  He is being careful since then and trying to walk more to get stronger and trying to be more aware of his gait pattern.  He was walking with a walker until ~4 months ago, then started using a cane, and then has tried walking without any assistance.      He feels like his difficulty walking limits his ability to get out of  the house as much as he used to because he is fearful of falling.  PLOF- he enjoys getting out of the house for social activities, going out to breakfast too; going grocery shopping a few times/week to get his groceries; prior to his back surgery he walked at the park in [place], hasn't been back though because he is worried about falling though.  He has a home aide that comes a few days a week to assist with household tasks as needed.  Also reports he had a MI a little over a week ago and was in the hospital (8/3-10/16/22)- he states he is referred to begin cardiac rehab as part of his outpatient recovery.  PERTINENT HISTORY: Extensive cardiac history History of lumbar spine surgery- 2022 L4-5 microdiscectomy/laminectomy   PAIN:  Are you having pain? R groin muscle area where he had the cardiac catheterization procedure last week.     PRECAUTIONS: Fall  RED FLAGS: Pt with recent MI (10/13/22) with stent placement (see chart review)  WEIGHT BEARING RESTRICTIONS: No  FALLS:  Has patient fallen in last 6 months? Yes. Number of falls multiple  LIVING ENVIRONMENT: Lives with: lives alone (has a self reported "lady friend" for social support and enjoys being able to get out the house for community activities, going to restaurants together) Lives in: House/apartment Stairs:  he lives in a single level home with bonus room over the garage, but he does not need to go upstairs.1 step to enter in the garage- able to get in without falling. Has following equipment at home: Single point cane, Walker - 4 wheeled, and Wheelchair (manual) Has a home health aide that comes 2-3x/week if he needs assistance  OCCUPATION: retired  PLOF: Independent  PATIENT GOALS: to address drop foot and be able to manage it better; to strengthen his legs; and to increase his breathing; and improve his balance   NEXT MD VISIT: yes, next week   OBJECTIVE:   DIAGNOSTIC FINDINGS: no recent lower extremity/spine  imaging  PATIENT SURVEYS:  FOTO 39/52  COGNITION: Overall cognitive status: Within functional limits for tasks assessed     SENSATION: Pt reports reduced sensation to light touch L L4-5 dermatomes  EDEMA:  No pitting edema noted on dorsum of foot   POSTURE: pt stands with wide base of support for balance; uses UE support on LE's for sit to stand transfer  PALPATION: (-) TTP distal LE b/l  LOWER EXTREMITY ROM:  Active ROM Right eval Left eval  Hip flexion WNL WNL  Hip extension    Hip abduction    Hip adduction    Hip internal rotation    Hip external rotation    Knee flexion 105 105  Knee extension -5 -5  Ankle dorsiflexion 5 deg PROM 0 deg  Ankle plantarflexion    Great toe extension 45 deg PROM 40 deg   Ankle eversion     (Blank rows = not tested)  LOWER EXTREMITY MMT:  MMT Right eval Left eval  Hip flexion 4 4  Hip extension    Hip abduction    Hip adduction    Hip internal rotation    Hip external rotation    Knee flexion 5 5  Knee extension 4 4  Ankle dorsiflexion 4/5 1/5  Ankle plantarflexion 3/5 3/5  Great toe extension 4/5 2/5  Ankle eversion 4/5 3/5   (Blank rows = not tested)   FUNCTIONAL TESTS:  OUTCOME MEASURES: TEST Outcome Interpretation  5 times sit<>stand 21 seconds >60 yo, >15 sec indicates increased risk for falls  10 meter walk test deferred <1.0 m/s indicates increased risk for falls; limited community ambulator  [employer] Assessment /56   deferred <36/56 (100% risk for falls), 37-45 (80% risk for falls); 46-51 (>50% risk for falls); 52-55 (lower risk <25% of falls)      GAIT: Distance walked: from waiting area into PT gym 50 ft, PT supervision Assistive device utilized: None Level of assistance: Complete Independence Comments: Pt ambulates with decreased gait speed, wide BOS, decreased stride length, excess L hip flexion strategy to compensate for L foot drop  Pt not able to stand SLS on L without UE support today;  able to stand R LE x 5 seconds  TODAY'S TREATMENT:  DATE: 11/22/22  Subjective: Patient has no new complaints upon arrival today;  his cardiac rehab continues next week.  Pain: 0/10  Objective: Vitals upon arrival SPO2: 97% HR: 57 Focused on RPE in "light activity" for session   Therapeutic Exercises: Sit to stand x 5, 2 sets at start of session from elevated table without B UE support  Seated tilt board emphasis on DF 2x15 b/l and L x15  Seated marches: 3# AW 2x10  Seated knee extension: 3# AW 2x10   Seated hip adduction ball squeeze 2 x 10 with 3 second hold  Standing heel raises: 2x10  Seated toe raises (with PT assist on L) 2x15 to L ant tib fatigue  Seated Eversion with towel slide seated on floor x20 to fatigue  Seated isometric inversion with ball x20 to fatigue  Alternating toe taps : 6 inch height, 2 x5 R LE, 2x5 L LE with finger tip support on railing, emphasis on hip flexion strategy due to L foot drop  Standing hip abduction 3# AW x 10 each LE  Front step up onto airex: x15 leading with R LE and x15 leading with L LE, b/l UE support  Step ups onto 6 inch step: with 2 finger support ea UE; x10 L and x10 R  SLS balance: 5x 20 second trials on L and R with single UE support and removing hand briefly- more steady on R  HR 58 after exercise, SPO2 98%  Amb with patient out to his car after session; observed he is amb with less of a wide base of support, improved push off at terminal stance observed bilaterally; overall improved sagittal plane movement and less compensatory frontal plane motion in trunk and lower extremities noted   Not today:  -Hurdles: forward direction in // bars (PT supervision with gait belt) 6 laps, focused on hip flexion strategy for foot clearance; attempted cue for "lift toes" but pt not able to actively perform  active DF of ankle against gravity -Side step over hurdles in // bars: 6 laps -Airex: feet together, 3 trials x 30-40 seconds with removing hand support -Airex: tandem stance, 3 trials each R in front/L in front x 20-30 seconds removing hand support (pt notes ankle/lower leg fatigue after this)    PATIENT EDUCATION:  Education details: PT POC/goals, importance of attending cardiac rehab, activity pacing Person educated: Patient Education method: Explanation Education comprehension: verbalized understanding and needs further education  HOME EXERCISE PROGRAM: Access Code: [email or web address] URL: [email or web address] Date: 10/24/2022 Prepared by: Max Fickle  Exercises - Seated Heel Raise  - 1 x daily - 3 x weekly - 2 sets - 20 reps - Seated March  - 1 x daily - 3 x weekly - 2 sets - 10 reps - Seated Long Arc Quad  - 1 x daily - 3 x weekly - 2 sets - 10 reps  ASSESSMENT:  CLINICAL IMPRESSION:  Patient arrived reporting less fatigue than at previous session.  Overall, he tolerated session well.  L DF still weak, but his gait pattern is improved with more symmetrical stride length and pt utilizing hip flexion strategy without "catching toes" when he performs step up type movements.  Amb with him out to car for safety as he reports fatigue.  Patient will continue to benefit from skilled PT intervention to address listed impairments to decrease fall risk and improve overall mobility.   OBJECTIVE IMPAIRMENTS: Abnormal gait, cardiopulmonary status limiting activity, decreased activity tolerance, decreased balance, decreased mobility, difficulty walking, decreased  ROM, and decreased strength.   ACTIVITY LIMITATIONS: standing, squatting, stairs, transfers, and locomotion level  PARTICIPATION LIMITATIONS: meal prep, cleaning, laundry, interpersonal relationship, shopping, and community activity  PERSONAL FACTORS: Age, Past/current experiences, Time since onset of  injury/illness/exacerbation, and cardiovascular hx including recent MI with stent placement last week  are also affecting patient's functional outcome.   REHAB POTENTIAL: Good  CLINICAL DECISION MAKING: Stable/uncomplicated  EVALUATION COMPLEXITY: Low   GOALS: Goals reviewed with patient? Yes  SHORT TERM GOALS: Target date: 11/11/22 Pt will be able to perform a HEP for LE strengthening >3x/week Baseline: Goal status: INITIAL   LONG TERM GOALS: Target date: 12/04/22  Improve FOTO to >51 indicating pt able to perform his daily activities without being limited by his balance Baseline: 39 Goal status: INITIAL  2.  Pt will be able to amb with typical width BOS and b/l heel strike gait pattern using least restrictive AD and AFO on flat/incline/decline/grass surfaces x 10 min Baseline: not using any AD or AFO in clinic, pt wide BOS, no L heel strike due to foot drop Goal status: INITIAL  3.  Pt will be able to perform 5x STS in <15 sec indicating reduced risk for falling Baseline: 21 sec Goal status: INITIAL  PLAN:  PT FREQUENCY: 2x/week  PT DURATION: 6 weeks  PLANNED INTERVENTIONS: Therapeutic exercises, Therapeutic activity, Neuromuscular re-education, Balance training, Gait training, Patient/Family education, Self Care, Joint mobilization, and Orthotic/Fit training  PLAN FOR NEXT SESSION: continue to assess BP and vitals, continue low intensity LE strength/balance  Max Fickle, PT, DPT, OCS  Physical Therapist - [facility]   Ardine Bjork, PT 11/22/2022, 11:51 AM

## 2022-11-22 ENCOUNTER — Encounter: Payer: Self-pay | Admitting: Cardiovascular Disease

## 2022-11-26 ENCOUNTER — Ambulatory Visit: Payer: Medicare HMO

## 2022-11-26 DIAGNOSIS — R2689 Other abnormalities of gait and mobility: Secondary | ICD-10-CM

## 2022-11-26 DIAGNOSIS — M6281 Muscle weakness (generalized): Secondary | ICD-10-CM | POA: Diagnosis not present

## 2022-11-26 NOTE — Therapy (Signed)
OUTPATIENT PHYSICAL THERAPY LOWER EXTREMITY TREATMENT/Progress Note/Re-certification through 12/24/22   Patient Name: Devin [name] [facility]. MRN: [id number] DOB:Jul 26, 1945, 77 y.o., male Today's Date: 11/27/2022  END OF SESSION:  PT End of Session - 11/26/22 1239     Visit Number 10    Number of Visits 18    Date for PT Re-Evaluation 12/24/22    Authorization Type 2x/week x 6 weeks (re-cert/PN done on 11/27/22 x 4 additional weeks)    PT Start Time 1203    PT Stop Time 1245    PT Time Calculation (min) 42 min    Activity Tolerance Patient tolerated treatment well    Behavior During Therapy WFL for tasks assessed/performed               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   Hypertension    Ischemic cardiomyopathy    MI, old    Peripheral neuropathy    PFO (patent foramen ovale) 05/2019   s/p repair   Sleep apnea    Spinal stenosis of lumbar region    TIA (transient ischemic attack)    Past Surgical History:  Procedure Laterality Date   APPENDECTOMY     BREAST SURGERY Right    benign mass   CARDIOVERSION Right 12/15/2019   Procedure: CARDIOVERSION;  Surgeon: Laurier Nancy, MD;  Location: [facility] ORS;  Service: Cardiovascular;  Laterality: Right;   CATARACT EXTRACTION, BILATERAL     COLONOSCOPY     CORONARY ANGIOPLASTY WITH STENT PLACEMENT     CORONARY ARTERY BYPASS GRAFT  05/2019   LIMA-LAD   CORONARY STENT INTERVENTION N/A 10/15/2022   Procedure: CORONARY STENT INTERVENTION;  Surgeon: Marcina Millard, MD;  Location: [facility] INVASIVE CV LAB;  Service: Cardiovascular;  Laterality: N/A;   LEFT HEART CATH N/A  10/15/2022   Procedure: Left Heart Cath;  Surgeon: Marcina Millard, MD;  Location: [facility] INV[facility]IVE CV LAB;  Service: Cardiovascular;  Laterality: N/A;   LEFT HEART CATH AND CORS/GRAFTS ANGIOGRAPHY N/A 05/21/2019   Procedure: LEFT HEART CATH AND CORONARY ANGIOGRAPHY;  Surgeon: Laurier Nancy, MD;  Location: [facility] INV[facility]IVE CV LAB;  Service: Cardiovascular;  Laterality: N/A;   LUMBAR LAMINECTOMY/DECOMPRESSION MICRODISCECTOMY N/A 07/04/2020   Procedure: L4-5 LAMINECTOMY;  Surgeon: Lucy Chris, MD;  Location: [facility] ORS;  Service: Neurosurgery;  Laterality: N/A;   REPLACEMENT TOTAL KNEE BILATERAL     Patient Active Problem List   Diagnosis Date Noted   NSTEMI (non-ST elevated myocardial infarction) (HCC) 10/13/2022   HFrEF (heart failure with reduced ejection fraction) (HCC) 10/13/2022   Degenerative joint disease (DJD) of lumbar spine 10/12/2022   Frequent falls 05/15/2022   Unsteady gait 04/16/2022   Acute delirium 04/13/2022   Electrolyte abnormality 04/13/2022   Nausea & vomiting 04/13/2022   PAD (peripheral artery disease) (HCC) 09/03/2021   Localized, primary osteoarthritis 11/24/2020   Wound healing, delayed 09/08/2020   History of lumbar surgery 09/08/2020   Chronic ischemic heart disease 07/13/2020   History of MI (myocardial infarction) 07/13/2020   History of PTCA 1 07/13/2020   Other personal history  presenting hazards to health 07/13/2020   Pure hypercholesterolemia 07/13/2020   Elevated lactic acid level 04/19/2020   Generalized weakness 04/09/2020   Fall at home 04/09/2020   Gastroenteritis 04/09/2020   AMS (altered mental status) 04/08/2020   Hypoxia 02/01/2020   Atrial fibrillation status post cardioversion (HCC) 12/22/2019   Congestive heart failure (CHF) (HCC) 12/13/2019   Hyponatremia 12/06/2019   Acute postoperative anemia due to expected blood loss 05/28/2019   Hyperlipidemia 05/28/2019   Ischemic cardiomyopathy 05/28/2019   S/P CABG (coronary artery bypass  graft) 05/28/2019   Unstable angina (HCC) 05/24/2019   Chest pain 05/21/2019   Ischemic chest pain (HCC) 05/16/2019   CAD (coronary artery disease) 05/16/2019   HTN (hypertension) 05/16/2019   Depression 05/16/2019   GERD (gastroesophageal reflux disease) 05/16/2019   Verruca plantaris 01/23/2018   Obstructive sleep apnea syndrome 01/20/2018   Major depressive disorder, single episode, moderate (HCC) 07/05/2016   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 Zada Finders, MD  REFERRING PROVIDER: Dr. Zada Finders, MD/ [name], [facility]  REFERRING DIAG:  R26.9 (ICD-10-CM) - Unspecified abnormalities of gait and mobility  R26.89 (ICD-10-CM) - Other abnormalities of gait and mobility  R29.6 (ICD-10-CM) - Repeated falls    THERAPY DIAG:  Muscle weakness (generalized)  Balance problem  Other abnormalities of gait and mobility  Rationale for Evaluation and Treatment: Rehabilitation  ONSET DATE: at least 2 years  SUBJECTIVE: (From initial evaluation note)  SUBJECTIVE STATEMENT: Pt reports he is having difficulty with gait and balance.  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   He feels like his balance and walking difficulty have worsened recently.  He reports he started falling frequently, he was catching his foot on things and tripping.  The most recent major fall was about 6 months ago.  He is being careful since then and trying to walk more to get stronger and trying to be more aware of his gait pattern.  He was walking with a walker until ~4 months ago, then started using a cane, and then has tried walking without any  assistance.      He feels like his difficulty walking limits his ability to get out of the house as much as he used to because he is fearful of falling.  PLOF- he enjoys getting out of the house for social activities, going out to breakfast too; going grocery shopping a few times/week to get his groceries; prior to his back surgery he walked at the park in [place], hasn't been back though because he is worried about falling though.  He has a home aide that comes a few days a week to assist with household tasks as needed.  Also reports he had a MI a little over a week ago and was in the hospital (8/3-10/16/22)- he states he is referred to begin cardiac rehab as part of his outpatient recovery.  PERTINENT HISTORY: Extensive cardiac history History of lumbar spine surgery- 2022 L4-5 microdiscectomy/laminectomy   PAIN:  Are you having pain? R groin muscle area where he had the cardiac catheterization procedure last week.     PRECAUTIONS: Fall  RED FLAGS: Pt with recent  MI (10/13/22) with stent placement (see chart review)  WEIGHT BEARING RESTRICTIONS: No  FALLS:  Has patient fallen in last 6 months? Yes. Number of falls multiple  LIVING ENVIRONMENT: Lives with: lives alone (has a self reported "lady friend" for social support and enjoys being able to get out the house for community activities, going to restaurants together) Lives in: House/apartment Stairs:  he lives in a single level home with bonus room over the garage, but he does not need to go upstairs.1 step to enter in the garage- able to get in without falling. Has following equipment at home: Single point cane, Walker - 4 wheeled, and Wheelchair (manual) Has a home health aide that comes 2-3x/week if he needs assistance  OCCUPATION: retired  PLOF: Independent  PATIENT GOALS: to address drop foot and be able to manage it better; to strengthen his legs; and to increase his breathing; and improve his balance   NEXT MD VISIT: yes, next  week   OBJECTIVE:   DIAGNOSTIC FINDINGS: no recent lower extremity/spine imaging  PATIENT SURVEYS:  FOTO 39/52  COGNITION: Overall cognitive status: Within functional limits for tasks assessed     SENSATION: Pt reports reduced sensation to light touch L L4-5 dermatomes  EDEMA:  No pitting edema noted on dorsum of foot   POSTURE: pt stands with wide base of support for balance; uses UE support on LE's for sit to stand transfer  PALPATION: (-) TTP distal LE b/l  LOWER EXTREMITY ROM:  Active ROM Right eval Left eval  Hip flexion WNL WNL  Hip extension    Hip abduction    Hip adduction    Hip internal rotation    Hip external rotation    Knee flexion 105 105  Knee extension -5 -5  Ankle dorsiflexion 5 deg PROM 0 deg  Ankle plantarflexion    Great toe extension 45 deg PROM 40 deg   Ankle eversion     (Blank rows = not tested)  LOWER EXTREMITY MMT:  MMT Right eval Left eval  Hip flexion 4 4  Hip extension    Hip abduction    Hip adduction    Hip internal rotation    Hip external rotation    Knee flexion 5 5  Knee extension 4 4  Ankle dorsiflexion 4/5 1/5  Ankle plantarflexion 3/5 3/5  Great toe extension 4/5 2/5  Ankle eversion 4/5 3/5   (Blank rows = not tested)   FUNCTIONAL TESTS:  OUTCOME MEASURES: TEST Outcome Interpretation  5 times sit<>stand 21 seconds >60 yo, >15 sec indicates increased risk for falls  10 meter walk test deferred <1.0 m/s indicates increased risk for falls; limited community ambulator  [employer] Assessment /56   deferred <36/56 (100% risk for falls), 37-45 (80% risk for falls); 46-51 (>50% risk for falls); 52-55 (lower risk <25% of falls)      GAIT: Distance walked: from waiting area into PT gym 50 ft, PT supervision Assistive device utilized: None Level of assistance: Complete Independence Comments: Pt ambulates with decreased gait speed, wide BOS, decreased stride length, excess L hip flexion strategy to compensate  for L foot drop  Pt not able to stand SLS on L without UE support today; able to stand R LE x 5 seconds  TODAY'S TREATMENT:  DATE: 11/27/22  Subjective: Patient has no new complaints upon arrival today;  his cardiac rehab is tomorrow.  He forgot to bring his soft ankle DF assist brace today.  Overall he feels like he is walking a little better; but today he almost tripped when he caught his toe on a step- he attributes this to wearing different shoes he isn't used to so he miscalculated how high to pick up his foot.  Pain: 0/10  Objective: Vitals upon arrival SPO2: 99% HR: 60 Focused on RPE in "light activity" for session   Updated goals/Re-cert today (see below)  5X STS: 19 seconds with min UE support on thighs Single leg balance: L 3 seconds Gait: pt amb with SPC in/out of clinic now; using SPC allows for better forward gait progression with more typical base of support width.    Therapeutic Exercises: Sit to stand x 5, 2 sets from chair, min UE use on thighs  Seated tilt board emphasis on DF 2x15 b/l and L x15  Seated marches: 3# AW 2x10- not today  Seated knee extension: 3# AW 2x10 - not today  Seated hip adduction ball squeeze 2 x 10 with 3 second hold- not today  Standing heel raises: 2x10  Seated toe raises (with PT assist on L) 2x15 to L ant tib fatigue  Seated Eversion with towel slide seated on floor x20 to fatigue  Seated isometric inversion with ball x20 to fatigue  Alternating toe taps : 6 inch height, 2 x5 R LE, 2x5 L LE with finger tip support on railing, emphasis on hip flexion strategy due to L foot drop- not today  Standing hip abduction 3# AW x 10 each LE- not today  Side stepping in // bars: 3 laps  Small hurdles (practiced without UE support) 3 laps, clearing with hip flexion strategy on L.  Front step up onto airex: x15  leading with R LE and x15 leading with L LE, b/l UE support  Feet together on airex: 30 seconds x 3, then added head turns R/L and up/down 2x 30 seconds each- pt with intermittent single hand support  Step ups onto 6 inch step: with 2 finger support ea UE; x10 L and x10 R- not today  SLS balance: 5x 20 second trials on L and R with single UE support and removing hand briefly- more steady on R   Amb with patient out to his car after session; observed he is amb with less of a wide base of support, improved push off at terminal stance observed bilaterally; overall improved sagittal plane movement and less compensatory frontal plane motion in trunk and lower extremities noted   Not today:  -Hurdles: forward direction in // bars (PT supervision with gait belt) 6 laps, focused on hip flexion strategy for foot clearance; attempted cue for "lift toes" but pt not able to actively perform active DF of ankle against gravity -Side step over hurdles in // bars: 6 laps -Airex: feet together, 3 trials x 30-40 seconds with removing hand support -Airex: tandem stance, 3 trials each R in front/L in front x 20-30 seconds removing hand support (pt notes ankle/lower leg fatigue after this)    PATIENT EDUCATION:  Education details: PT POC/goals, importance of attending cardiac rehab, activity pacing Person educated: Patient Education method: Explanation Education comprehension: verbalized understanding and needs further education  HOME EXERCISE PROGRAM: Access Code: [email or web address] URL: [email or web address] Date: 10/24/2022 Prepared by: Max Fickle  Exercises - Seated Heel Raise  - 1 x  daily - 3 x weekly - 2 sets - 20 reps - Seated March  - 1 x daily - 3 x weekly - 2 sets - 10 reps - Seated Long Arc Quad  - 1 x daily - 3 x weekly - 2 sets - 10 reps  ASSESSMENT:  CLINICAL IMPRESSION:  Patient is making slow, steady progress with PT.  Ankle DF weakness still contributing to + drop foot  during ambulation; in addition to focusing on LE strengthening, gait training, and balance/proprioception I am also discussing strategies with pt to improve safety with ambulation and on stairs via an ankle DF assist brace vs AFO.  Patient will continue to benefit from skilled PT intervention to address listed impairments to decrease fall risk and improve overall mobility.   OBJECTIVE IMPAIRMENTS: Abnormal gait, cardiopulmonary status limiting activity, decreased activity tolerance, decreased balance, decreased mobility, difficulty walking, decreased ROM, and decreased strength.   ACTIVITY LIMITATIONS: standing, squatting, stairs, transfers, and locomotion level  PARTICIPATION LIMITATIONS: meal prep, cleaning, laundry, interpersonal relationship, shopping, and community activity  PERSONAL FACTORS: Age, Past/current experiences, Time since onset of injury/illness/exacerbation, and cardiovascular hx including recent MI with stent placement last week  are also affecting patient's functional outcome.   REHAB POTENTIAL: Good  CLINICAL DECISION MAKING: Stable/uncomplicated  EVALUATION COMPLEXITY: Low   GOALS: Goals reviewed with patient? Yes  SHORT TERM GOALS: Target date: 11/11/22 Pt will be able to perform a HEP for LE strengthening >3x/week Baseline: 11/27/22 pt has been instructed on a HEP Goal status: In progress   LONG TERM GOALS: Target date: 12/04/22  Improve FOTO to >51 indicating pt able to perform his daily activities without being limited by his balance Baseline: 39 Goal status: In Progress  2.  Pt will be able to amb with typical width BOS and b/l heel strike gait pattern using least restrictive AD and AFO on flat/incline/decline/grass surfaces x 10 min Baseline: not using any AD or AFO in clinic, pt wide BOS, no L heel strike due to foot drop; 11/27/22 pt is amb with SPC now, discussions continue regarding AFO/brace for improved safety Goal status: INITIAL  3.  Pt will be able  to perform 5x STS in <15 sec indicating reduced risk for falling Baseline: 21 sec, 11/27/22 19 sec (with mild UE use) Goal status: INITIAL  PLAN:  PT FREQUENCY: 2x/week  PT DURATION: 4 weeks  PLANNED INTERVENTIONS: Therapeutic exercises, Therapeutic activity, Neuromuscular re-education, Balance training, Gait training, Patient/Family education, Self Care, Joint mobilization, and Orthotic/Fit training  PLAN FOR NEXT SESSION: continue to assess BP and vitals, continue low intensity LE strength/balance x 1 more month to optimize outcome expected with skilled PT  Max Fickle, PT, DPT, OCS  Physical Therapist - [facility]   Vinnie Langton [place], PT 11/27/2022, 12:46 PM

## 2022-11-27 ENCOUNTER — Encounter: Payer: Medicare HMO | Admitting: *Deleted

## 2022-11-27 DIAGNOSIS — I214 Non-ST elevation (NSTEMI) myocardial infarction: Secondary | ICD-10-CM | POA: Diagnosis not present

## 2022-11-27 DIAGNOSIS — Z955 Presence of coronary angioplasty implant and graft: Secondary | ICD-10-CM

## 2022-11-27 LAB — GLUCOSE, CAPILLARY
Glucose-Capillary: 109 mg/dL — ABNORMAL HIGH (ref 70–99)
Glucose-Capillary: 107 mg/dL — ABNORMAL HIGH (ref 70–99)

## 2022-11-27 NOTE — Progress Notes (Signed)
Assessment start time: 10:20 AM  Digestive issues/concerns: no known food allergies, no digestive issue   24-hours Recall: B: oatmeal, blueberries/strawberries, brown sugar L: [place] m[employer] ham and cheese sub, root beer just a little bit D: olive garden, salad, soups  Beverages: water/[place] (~64-80oz),  Alcohol none  Supplements none Intake Patterns Reports he doesn't always eat the entire meal.   Education r/t nutrition plan patient drinking >64oz of water most days. Rarely drinks sugary drinks. He eats 3 meals per day. Has veteran in-home assistance to help him make meals. Likes to go out to restaurants for social interaction, but tries to make good food choices. Reviewed mediterranean diet handout. Educated on types of fats, sources, and how to read labels. He reports he doesn't use salt at home, commended him and educated about sodium limits, how processed foods can have lots in them before arriving to the table. Reviewed his 24hr food recall, encouraged more protein. Recommended greek yogurt, cottage cheese, hard boiled eggs, tuna, chicken, premier protein shakes as ways to boost protein intake. Build out several meals and snacks with foods he likes and will eat, focusing on adequate protein intake, healthy fats and colorful plates with controlled portions of carbs.   Goal 1: Eat a protein at every meal and pair it with a carb  Goal 2: Use protein snacks if needed Goal 3: Use sweets smartly, and make good snack choices   End time 11:22 AM

## 2022-11-27 NOTE — Progress Notes (Signed)
Daily Session Note  Patient Details  Name: Devin [name] [facility]. MRN: [id number] Date of Birth: 1945-11-12 Referring Provider:   Flowsheet Row Cardiac Rehab from 11/19/2022 in [facility] Cardiac and Pulmonary Rehab  Referring Provider Dr. Adrian Blackwater MD       Encounter Date: 11/27/2022  Check In:  Session Check In - 11/27/22 0938       Check-In   Supervising physician immediately available to respond to emergencies See telemetry face sheet for immediately available ER MD    Location [facility]-Cardiac & Pulmonary Rehab    Staff Present Cora Collum, RN, BSN, CCRP;Noah Tickle, BS, Exercise Physiologist;Maxon Conetta BS, , Exercise Physiologist    Virtual Visit No    Medication changes reported     No    Fall or balance concerns reported    No    Warm-up and Cool-down Performed on first and last piece of equipment    Resistance Training Performed Yes    VAD Patient? No    PAD/SET Patient? No      Pain Assessment   Currently in Pain? No/denies                Social History   Tobacco Use  Smoking Status Former  Smokeless Tobacco Never  Tobacco Comments   Quit over 40 years ago    Goals Met:  Exercise tolerated well Personal goals reviewed No report of concerns or symptoms today  Goals Unmet:  Not Applicable  Comments: First full day of exercise!  Patient was oriented to gym and equipment including functions, settings, policies, and procedures.  Patient's individual exercise prescription and treatment plan were reviewed.  All starting workloads were established based on the results of the 6 minute walk test done at initial orientation visit.  The plan for exercise progression was also introduced and progression will be customized based on patient's performance and goals. AAA parameters  set by his physician explained to patient.   Dr. Bethann Punches is Medical Director for [facility] Cardiac Rehabilitation.  Dr. Vida Rigger is Medical Director for [facility] Pulmonary  Rehabilitation.

## 2022-11-28 ENCOUNTER — Ambulatory Visit: Payer: Medicare HMO

## 2022-11-28 DIAGNOSIS — M6281 Muscle weakness (generalized): Secondary | ICD-10-CM

## 2022-11-28 DIAGNOSIS — R2689 Other abnormalities of gait and mobility: Secondary | ICD-10-CM

## 2022-11-28 NOTE — Therapy (Signed)
OUTPATIENT PHYSICAL THERAPY LOWER EXTREMITY TREATMENT/Re-certification through 12/24/22   Patient Name: Devin [name] [facility]. MRN: [id number] DOB:10/13/1945, 77 y.o., male Today's Date: 11/28/2022  END OF SESSION:  PT End of Session - 11/28/22 1537     Visit Number 11    Number of Visits 18    Date for PT Re-Evaluation 12/24/22    Authorization Type 2x/week x 6 weeks (re-cert/PN done on 11/27/22 x 4 additional weeks)    PT Start Time 1203    PT Stop Time 1245    PT Time Calculation (min) 42 min    Activity Tolerance Patient tolerated treatment well    Behavior During Therapy WFL for tasks assessed/performed               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   Hypertension    Ischemic cardiomyopathy    MI, old    Peripheral neuropathy    PFO (patent foramen ovale) 05/2019   s/p repair   Sleep apnea    Spinal stenosis of lumbar region    TIA (transient ischemic attack)    Past Surgical History:  Procedure Laterality Date   APPENDECTOMY     BREAST SURGERY Right    benign mass   CARDIOVERSION Right 12/15/2019   Procedure: CARDIOVERSION;  Surgeon: Laurier Nancy, MD;  Location: [facility] ORS;  Service: Cardiovascular;  Laterality: Right;   CATARACT EXTRACTION, BILATERAL     COLONOSCOPY     CORONARY ANGIOPLASTY WITH STENT PLACEMENT     CORONARY ARTERY BYPASS GRAFT  05/2019   LIMA-LAD   CORONARY STENT INTERVENTION N/A 10/15/2022   Procedure: CORONARY STENT INTERVENTION;  Surgeon: Marcina Millard, MD;  Location: [facility] INVASIVE CV LAB;  Service: Cardiovascular;  Laterality: N/A;   LEFT HEART CATH N/A 10/15/2022    Procedure: Left Heart Cath;  Surgeon: Marcina Millard, MD;  Location: [facility] INV[facility] CV [facility];  Service: Cardiovascular;  Laterality: N/A;   LEFT HEART CATH AND CORS/GRAFTS ANGIOGRAPHY N/A 05/21/2019   Procedure: LEFT HEART CATH AND CORONARY ANGIOGRAPHY;  Surgeon: Laurier Nancy, MD;  Location: [facility] INV[facility] CV [facility];  Service: Cardiovascular;  Laterality: N/A;   LUMBAR LAMINECTOMY/DECOMPRESSION MICRODISCECTOMY N/A 07/04/2020   Procedure: L4-5 LAMINECTOMY;  Surgeon: Lucy Chris, MD;  Location: [facility] ORS;  Service: Neurosurgery;  Laterality: N/A;   REPLACEMENT TOTAL KNEE BILATERAL     Patient Active Problem List   Diagnosis Date Noted   NSTEMI (non-ST elevated myocardial infarction) (HCC) 10/13/2022   HFrEF (heart failure with reduced ejection fraction) (HCC) 10/13/2022   Degenerative joint disease (DJD) of lumbar spine 10/12/2022   Frequent falls 05/15/2022   Unsteady gait 04/16/2022   Acute delirium 04/13/2022   Electrolyte abnormality 04/13/2022   Nausea & vomiting 04/13/2022   PAD (peripheral artery disease) (HCC) 09/03/2021   Localized, primary osteoarthritis 11/24/2020   Wound healing, delayed 09/08/2020   History of lumbar surgery 09/08/2020   Chronic ischemic heart disease 07/13/2020   History of MI (myocardial infarction) 07/13/2020   History of PTCA 1 07/13/2020   Other personal history presenting  hazards to health 07/13/2020   Pure hypercholesterolemia 07/13/2020   Elevated lactic acid level 04/19/2020   Generalized weakness 04/09/2020   Fall at home 04/09/2020   Gastroenteritis 04/09/2020   AMS (altered mental status) 04/08/2020   Hypoxia 02/01/2020   Atrial fibrillation status post cardioversion (HCC) 12/22/2019   Congestive heart failure (CHF) (HCC) 12/13/2019   Hyponatremia 12/06/2019   Acute postoperative anemia due to expected blood loss 05/28/2019   Hyperlipidemia 05/28/2019   Ischemic cardiomyopathy 05/28/2019   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   Unstable angina (HCC) 05/24/2019   Chest pain 05/21/2019   Ischemic chest pain (HCC) 05/16/2019   CAD (coronary artery disease) 05/16/2019   HTN (hypertension) 05/16/2019   Depression 05/16/2019   GERD (gastroesophageal reflux disease) 05/16/2019   Verruca plantaris 01/23/2018   Obstructive sleep apnea syndrome 01/20/2018   Major depressive disorder, single episode, moderate (HCC) 07/05/2016   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 Zada Finders, MD  REFERRING PROVIDER: Dr. Zada Finders, MD/ [name], [facility]  REFERRING DIAG:  R26.9 (ICD-10-CM) - Unspecified abnormalities of gait and mobility  R26.89 (ICD-10-CM) - Other abnormalities of gait and mobility  R29.6 (ICD-10-CM) - Repeated falls    THERAPY DIAG:  Muscle weakness (generalized)  Balance problem  Rationale for Evaluation and Treatment: Rehabilitation  ONSET DATE: at least 2 years  SUBJECTIVE: (From initial evaluation note)  SUBJECTIVE STATEMENT: Pt reports he is having difficulty with gait and balance.  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   He feels like his balance and walking difficulty have worsened recently.  He reports he started falling frequently, he was catching his foot on things and tripping.  The most recent major fall was about 6 months ago.  He is being careful since then and trying to walk more to get stronger and trying to be more aware of his gait pattern.  He was walking with a walker until ~4 months ago, then started using a cane, and then has tried walking without any assistance.      He feels like his difficulty walking  limits his ability to get out of the house as much as he used to because he is fearful of falling.  PLOF- he enjoys getting out of the house for social activities, going out to breakfast too; going grocery shopping a few times/week to get his groceries; prior to his back surgery he walked at the park in [place], hasn't been back though because he is worried about falling though.  He has a home aide that comes a few days a week to assist with household tasks as needed.  Also reports he had a MI a little over a week ago and was in the hospital (8/3-10/16/22)- he states he is referred to begin cardiac rehab as part of his outpatient recovery.  PERTINENT HISTORY: Extensive cardiac history History of lumbar spine surgery- 2022 L4-5 microdiscectomy/laminectomy   PAIN:  Are you having pain? R groin muscle area where he had the cardiac catheterization procedure last week.     PRECAUTIONS: Fall  RED FLAGS: Pt with recent MI (10/13/22) with stent placement (see chart review)  WEIGHT BEARING RESTRICTIONS: No  FALLS:  Has patient fallen in last 6 months? Yes. Number of falls multiple  LIVING ENVIRONMENT: Lives with: lives alone (has a self reported "lady friend" for social support and enjoys being able to get out the house for community activities, going to restaurants together) Lives in: House/apartment Stairs:  he lives in a single level home with bonus room over the garage, but he does not need to go upstairs.1 step to enter in the garage- able to get in without falling. Has following equipment at home: Single point cane, Walker - 4 wheeled, and Wheelchair (manual) Has a home health aide that comes 2-3x/week if he needs assistance  OCCUPATION: retired  PLOF: Independent  PATIENT GOALS: to address drop foot and be able to manage it better; to strengthen his legs; and to increase his breathing; and improve his balance   NEXT MD VISIT: yes, next week   OBJECTIVE:   DIAGNOSTIC FINDINGS: no recent  lower extremity/spine imaging  PATIENT SURVEYS:  FOTO 39/52  COGNITION: Overall cognitive status: Within functional limits for tasks assessed     SENSATION: Pt reports reduced sensation to light touch L L4-5 dermatomes  EDEMA:  No pitting edema noted on dorsum of foot   POSTURE: pt stands with wide base of support for balance; uses UE support on LE's for sit to stand transfer  PALPATION: (-) TTP distal LE b/l  LOWER EXTREMITY ROM:  Active ROM Right eval Left eval  Hip flexion WNL WNL  Hip extension    Hip abduction    Hip adduction    Hip internal rotation    Hip external rotation    Knee flexion 105 105  Knee extension -5 -5  Ankle dorsiflexion 5 deg PROM 0 deg  Ankle plantarflexion    Great toe extension 45 deg PROM 40 deg   Ankle eversion     (Blank rows = not tested)  LOWER EXTREMITY MMT:  MMT Right eval Left eval  Hip flexion 4 4  Hip extension    Hip abduction    Hip adduction    Hip internal rotation    Hip external rotation    Knee flexion 5 5  Knee extension 4 4  Ankle dorsiflexion 4/5 1/5  Ankle plantarflexion 3/5 3/5  Great toe extension 4/5 2/5  Ankle eversion 4/5 3/5   (Blank rows = not tested)   FUNCTIONAL TESTS:  OUTCOME MEASURES: TEST Outcome Interpretation  5 times sit<>stand 21 seconds >60 yo, >15 sec indicates increased risk for falls  10 meter walk test deferred <1.0 m/s indicates increased risk for falls; limited community ambulator  [employer] Assessment /56   deferred <36/56 (100% risk for falls), 37-45 (80% risk for falls); 46-51 (>50% risk for falls); 52-55 (lower risk <25% of falls)      GAIT: Distance walked: from waiting area into PT gym 50 ft, PT supervision Assistive device utilized: None Level of assistance: Complete Independence Comments: Pt ambulates with decreased gait speed, wide BOS, decreased stride length, excess L hip flexion strategy to compensate for L foot drop  Pt not able to stand SLS on L without  UE support today; able to stand R LE x 5 seconds  TODAY'S TREATMENT:  DATE: 11/28/22  Subjective: Patient has no new complaints upon arrival today;  he brought his AFO with him today  Pain: 0/10  Objective: Pt donned AFO and PT observed gait while wearing it; PT adjusted sneaker to be tighter for improved stability at ankle.  Pt wore AFO for entire session today to assess comfort/feel  Therapeutic Exercises: Sit to stand x 5, 2 sets from chair, min UE use on thighs  Seated knee extension: 3# AW 2x10   Alternating toe taps : 6 inch height, 2 x5 R LE, 2x5 L LE with finger tip support on railing  Side stepping in // bars: 3 laps  Small hurdles (practiced without UE support) 3 laps, clearing with L LE first  Step ups onto 6 inch step: with 2 finger support ea UE; 2x10, lead with L LE  Amb on flat surface using AFO and SPC 2x 2 minutes with PT supervision, focused on symmetrical step length  Front step up onto airex: x15 leading with R LE and x15 leading with L LE, b/l UE support- not today  Feet together on airex: 30 seconds x 3, then added head turns R/L and up/down 2x 30 seconds each- pt with intermittent single hand support- not today  SLS balance: 5x 20 second trials on L and R with single UE support and removing hand briefly- more steady on R- not today  Standing hip abduction 3# AW x 10 each LE- not today   Not today:  -Hurdles: forward direction in // bars (PT supervision with gait belt) 6 laps, focused on hip flexion strategy for foot clearance; attempted cue for "lift toes" but pt not able to actively perform active DF of ankle against gravity -Side step over hurdles in // bars: 6 laps -Airex: feet together, 3 trials x 30-40 seconds with removing hand support -Airex: tandem stance, 3 trials each R in front/L in front x 20-30 seconds removing hand  support (pt notes ankle/lower leg fatigue after this) Standing heel raises: 2x10 Seated toe raises (with PT assist on L) 2x15 to L ant tib fatigue Seated Eversion with towel slide seated on floor x20 to fatigue Seated isometric inversion with ball x20 to fatigue Seated tilt board emphasis on DF 2x15 b/l and L x15 Seated marches: 3# AW 2x10- not today Seated hip adduction ball squeeze 2 x 10 with 3 second hold  PATIENT EDUCATION:  Education details: PT POC/goals, importance of attending cardiac rehab, activity pacing Person educated: Patient Education method: Explanation Education comprehension: verbalized understanding and needs further education  HOME EXERCISE PROGRAM: Access Code: [email or web address] URL: [email or web address] Date: 10/24/2022 Prepared by: Max Fickle  Exercises - Seated Heel Raise  - 1 x daily - 3 x weekly - 2 sets - 20 reps - Seated March  - 1 x daily - 3 x weekly - 2 sets - 10 reps - Seated Long Arc Quad  - 1 x daily - 3 x weekly - 2 sets - 10 reps  ASSESSMENT:  CLINICAL IMPRESSION:  Patient brought his AFO with him to PT appointment.  Spent time at beginning of session assisting pt with appropriate fit in his sneaker.  Interventions all performed while pt wore his AFO.  His gait pattern is improved significantly when wearing AFO as it assists with supporting foot in slight DF, reducing likelihood of catching toe and tripping.  Checked in frequently with pt throughout session about how his L knee feels as he notes that in the past (>1 year ago) wearing  the AFO "bothered his L knee."  He notes it is comfortable during session; slight discomfort along lateral knee, but resolved with transition to seated exercises.  He wore AFO out of clinic at end of session as it was still comfortable.  Will assess at next visit his response to today's session.   Patient will continue to benefit from skilled PT intervention to address listed impairments to decrease fall risk  and improve overall mobility.   OBJECTIVE IMPAIRMENTS: Abnormal gait, cardiopulmonary status limiting activity, decreased activity tolerance, decreased balance, decreased mobility, difficulty walking, decreased ROM, and decreased strength.   ACTIVITY LIMITATIONS: standing, squatting, stairs, transfers, and locomotion level  PARTICIPATION LIMITATIONS: meal prep, cleaning, laundry, interpersonal relationship, shopping, and community activity  PERSONAL FACTORS: Age, Past/current experiences, Time since onset of injury/illness/exacerbation, and cardiovascular hx including recent MI with stent placement last week  are also affecting patient's functional outcome.   REHAB POTENTIAL: Good  CLINICAL DECISION MAKING: Stable/uncomplicated  EVALUATION COMPLEXITY: Low   GOALS: Goals reviewed with patient? Yes  SHORT TERM GOALS: Target date: 11/11/22 Pt will be able to perform a HEP for LE strengthening >3x/week Baseline: 11/27/22 pt has been instructed on a HEP Goal status: In progress   LONG TERM GOALS: Target date: 12/04/22  Improve FOTO to >51 indicating pt able to perform his daily activities without being limited by his balance Baseline: 39 Goal status: In Progress  2.  Pt will be able to amb with typical width BOS and b/l heel strike gait pattern using least restrictive AD and AFO on flat/incline/decline/grass surfaces x 10 min Baseline: not using any AD or AFO in clinic, pt wide BOS, no L heel strike due to foot drop; 11/27/22 pt is amb with SPC now, discussions continue regarding AFO/brace for improved safety Goal status: INITIAL  3.  Pt will be able to perform 5x STS in <15 sec indicating reduced risk for falling Baseline: 21 sec, 11/27/22 19 sec (with mild UE use) Goal status: INITIAL  PLAN:  PT FREQUENCY: 2x/week  PT DURATION: 4 weeks  PLANNED INTERVENTIONS: Therapeutic exercises, Therapeutic activity, Neuromuscular re-education, Balance training, Gait training, Patient/Family  education, Self Care, Joint mobilization, and Orthotic/Fit training  PLAN FOR NEXT SESSION: continue to assess BP and vitals, continue low intensity LE strength/balance x 1 more month to optimize outcome expected with skilled PT; assess response to AFO  Max Fickle, PT, DPT, OCS  Physical Therapist - [facility]   Vinnie Langton [place], PT 11/28/2022, 3:38 PM

## 2022-11-29 ENCOUNTER — Encounter: Payer: Medicare HMO | Admitting: *Deleted

## 2022-11-29 ENCOUNTER — Ambulatory Visit: Payer: Medicare HMO | Admitting: Cardiovascular Disease

## 2022-11-29 DIAGNOSIS — I214 Non-ST elevation (NSTEMI) myocardial infarction: Secondary | ICD-10-CM

## 2022-11-29 LAB — GLUCOSE, CAPILLARY
Glucose-Capillary: 162 mg/dL — ABNORMAL HIGH (ref 70–99)
Glucose-Capillary: 89 mg/dL (ref 70–99)

## 2022-11-29 NOTE — Progress Notes (Signed)
Daily Session Note  Patient Details  Name: Devin [name] [facility]. MRN: [id number] Date of Birth: 02-13-1946 Referring Provider:   Flowsheet Row Cardiac Rehab from 11/19/2022 in [facility] Cardiac and Pulmonary Rehab  Referring Provider Dr. Adrian Blackwater MD       Encounter Date: 11/29/2022  Check In:  Session Check In - 11/29/22 0953       Check-In   Supervising physician immediately available to respond to emergencies See telemetry face sheet for immediately available ER MD    Location [facility]-Cardiac & Pulmonary Rehab    Staff Present Ronette Deter, BS, Exercise Physiologist;Maxon Conetta BS, , Exercise Physiologist;Sparsh Callens Katrinka Blazing, RN, ADN    Virtual Visit No    Medication changes reported     No    Fall or balance concerns reported    No    Warm-up and Cool-down Performed on first and last piece of equipment    Resistance Training Performed Yes    VAD Patient? No    PAD/SET Patient? No      Pain Assessment   Currently in Pain? No/denies                Social History   Tobacco Use  Smoking Status Former  Smokeless Tobacco Never  Tobacco Comments   Quit over 40 years ago    Goals Met:  Independence with exercise equipment Exercise tolerated well No report of concerns or symptoms today Strength training completed today  Goals Unmet:  Not Applicable  Comments: Pt able to follow exercise prescription today without complaint.  Will continue to monitor for progression.    Dr. Bethann Punches is Medical Director for [facility] Cardiac Rehabilitation.  Dr. Vida Rigger is Medical Director for [facility] Pulmonary Rehabilitation.

## 2022-11-30 NOTE — Progress Notes (Unsigned)
Referring Physician:  Renford Dills, MD [address] [place],  [place] [address]  Primary Physician:  Dione Housekeeper, MD  History of Present Illness: 12/05/2022  Mr. Ozil Wadler has a history of NSTEMI in August, CAD, cardiac stents, PAD, afib, CHF, history of CABG, HTN, GERD, DM, AAA, and obesity.   He has intermittent LBP with no leg pain. 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 Pain is worse with bending and activity. He's had foot drop on left for years. No weakness in his right leg.   He is on ELIQUIS and PLAVIX. He is taking neurontin.   Bowel/Bladder Dysfunction: none  Conservative measures:  Physical therapy: has done 11 visits of PT in [place] for his balance and cardiac rehab.  Multimodal medical therapy including regular antiinflammatories: neurontin  Injections: No epidural steroid injections  Past Surgery:  L4-L5 decompression by Dr. Adriana Simas on 07/04/20   Review of Systems:  A 10 point review of systems is negative, except for the pertinent positives and negatives detailed in the HPI.  Past Medical History: 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   Hypertension    Ischemic cardiomyopathy    MI, old    Peripheral neuropathy    PFO (patent foramen ovale) 05/2019   s/p repair   Sleep apnea    Spinal stenosis of lumbar region    TIA (transient ischemic attack)     Past Surgical History: Past Surgical History:  Procedure Laterality Date   APPENDECTOMY     BREAST SURGERY Right    benign mass   CARDIOVERSION Right 12/15/2019   Procedure: CARDIOVERSION;  Surgeon: Laurier Nancy,  MD;  Location: [facility];  Service: Cardiovascular;  Laterality: Right;   CATARACT EXTRACTION, BILATERAL     COLONOSCOPY     CORONARY ANGIOPLASTY WITH STENT PLACEMENT     CORONARY ARTERY BYPASS GRAFT  05/2019   LIMA-LAD   CORONARY STENT INTERVENTION N/A 10/15/2022   Procedure: CORONARY STENT INTERVENTION;  Surgeon: Marcina Millard, MD;  Location: [facility];  Service: Cardiovascular;  Laterality: N/A;   LEFT HEART CATH N/A 10/15/2022   Procedure: Left Heart Cath;  Surgeon: Marcina Millard, MD;  Location: [facility];  Service: Cardiovascular;  Laterality: N/A;   LEFT HEART CATH AND CORS/GRAFTS ANGIOGRAPHY N/A 05/21/2019   Procedure: LEFT HEART CATH AND CORONARY ANGIOGRAPHY;  Surgeon: Laurier Nancy, MD;  Location: [facility];  Service: Cardiovascular;  Laterality: N/A;   LUMBAR LAMINECTOMY/DECOMPRESSION MICRODISCECTOMY N/A 07/04/2020   Procedure: L4-5 LAMINECTOMY;  Surgeon: Lucy Chris, MD;  Location: [facility];  Service: Neurosurgery;  Laterality: N/A;   REPLACEMENT TOTAL KNEE BILATERAL      Allergies: Allergies as of 12/05/2022 - Review Complete 12/05/2022  Allergen Reaction Noted   Morphine Swelling 05/31/2019   Yellow jacket venom [bee venom] Anaphylaxis 05/20/2019   Trazodone Other (See Comments) 12/12/2020    Medications: Outpatient Encounter Medications as of 12/05/2022  Medication Sig   albuterol (VENTOLIN HFA) 108 (90 Base) MCG/ACT inhaler Inhale 2 puffs  into the lungs every 6 (six) hours as needed for wheezing or shortness of breath.   amiodarone (PACERONE) 200 MG tablet Take 200 mg by mouth daily.   amLODipine (NORVASC) 10 MG tablet Take 1 tablet by mouth daily.   apixaban (ELIQUIS) 2.5 MG TABS tablet Take 1 tablet (2.5 mg total) by mouth 2 (two) times daily.   aspirin 81 MG chewable tablet Chew 1 tablet (81 mg total) by mouth daily.   atorvastatin (LIPITOR) 80 MG tablet Take 80 mg by mouth daily.   buPROPion (WELLBUTRIN XL) 300 MG 24 hr  tablet Take 300 mg by mouth daily.   cephALEXin (KEFLEX) 500 MG capsule Take 500 mg by mouth 3 (three) times daily.   clopidogrel (PLAVIX) 75 MG tablet Take 1 tablet (75 mg total) by mouth daily with breakfast.   docusate (COLACE) 60 MG/15ML syrup Take 60 mg by mouth daily.   EPINEPHrine 0.3 mg/0.3 mL IJ SOAJ injection Inject 0.3 mg into the muscle as needed for anaphylaxis.   ezetimibe (ZETIA) 10 MG tablet Take 10 mg by mouth daily.   fluticasone (FLONASE) 50 MCG/ACT nasal spray SPRAY 2 SPRAYS INTO EACH NOSTRIL EVERY DAY   gabapentin (NEURONTIN) 300 MG capsule Take 300 mg by mouth 2 (two) times daily.   loratadine (CLARITIN) 10 MG tablet Take 10 mg by mouth daily.   metFORMIN (GLUCOPHAGE) 500 MG tablet Take 1,000 mg by mouth 2 (two) times daily.   metoprolol succinate (TOPROL XL) 25 MG 24 hr tablet Take 1 tablet (25 mg total) by mouth daily.   nitroGLYCERIN (NITROSTAT) 0.4 MG SL tablet Place 0.4 mg under the tongue every 5 (five) minutes as needed for chest pain.   omeprazole (PRILOSEC OTC) 20 MG tablet Take 20 mg by mouth daily.   sacubitril-valsartan (ENTRESTO) 97-103 MG Hold until followup with Dr. Welton Flakes due to low blood pressure.   [DISCONTINUED] benazepril (LOTENSIN) 20 MG tablet Take 20 mg by mouth daily.   No facility-administered encounter medications on file as of 12/05/2022.    Social History: Social History   Tobacco Use   Smoking status: Former   Smokeless tobacco: Never   Tobacco comments:    Quit over 40 years ago  Vaping Use   Vaping status: Never Used  Substance Use Topics   Alcohol use: Not Currently    Comment: social   Drug use: Never    Family Medical History: Family History  Problem Relation Age of Onset   Osteoarthritis Mother    Other Father 68       MVA    Physical Examination: Vitals:   12/05/22 1356  BP: 138/72    General: Patient is well developed, well nourished, calm, collected, and in no apparent distress. Attention to examination is  appropriate.  Respiratory: Patient is breathing without any difficulty.   NEUROLOGICAL:     Awake, alert, oriented to person, place, and time.  Speech is clear and fluent. Fund of knowledge is appropriate.   Cranial Nerves: Pupils equal round and reactive to light.  Facial tone is symmetric.    Lumbar incision is well healed.   No lower lumbar tenderness.  No abnormal lesions on exposed skin.   Strength: Side Biceps Triceps Deltoid Interossei Grip Wrist Ext. Wrist Flex.  R 5 5 5 5 5 5 5   L 5 5 5 5 5 5 5    Side Iliopsoas Quads Hamstring PF DF EHL  R 5 5 5 5 5 5   L 5 5 5  5  3 3   Reflexes are 1+ and symmetric at the biceps, brachioradialis, patella and achilles.   Hoffman's is absent.  Clonus is not present.   Bilateral upper and lower extremity sensation is intact to light touch, but diminished in both legs from knees down.   He is wearing AFO type brace on left leg.   He has abnormal gait.   Medical Decision Making  Imaging: Lumbar xrays dated 05/15/22:  FINDINGS: Five lumbar type vertebral bodies.   No acute fracture or subluxation. Vertebral body heights are preserved.   Alignment is normal.   Posterior decompression at L4-L5. Similar mild disc height loss throughout the lumbar spine and moderate to severe lower lumbar facet arthropathy.   Sacroiliac joints are unremarkable.   IMPRESSION: 1. No acute osseous abnormality. 2. Similar multilevel lumbar spondylosis.     Electronically Signed   By: Obie Dredge M.D.   On: 05/15/2022 14:36    I have personally reviewed the images and agree with the above interpretation.  Assessment and Plan: Mr. Grizzel is a pleasant 77 y.o. male who has intermittent LBP with no leg pain. 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 He's had foot drop on left for years. No weakness in his right leg.   History of lumbar surgery as above. He has known lumbar spondylosis with mild DDD.   His current LBP is tolerable.    Treatment options discussed with patient and following plan made:   - Discussed options for LBP including PT. He declines for now. He is doing cardiac rehab.  - Medications limited as he is on PLAVIX and ELIQUIS.  - We discussed further options such as getting MRI and considering injections.  - Pain is tolerable and he does not want to proceed with any treatment. He will f/u prn at his request.  - Will let me know if he wants to start PT for lumbar spine once he is done with cardiac rehab.   I spent a total of 20 minutes in face-to-face and non-face-to-face activities related to this patient's care today including review of outside records, review of imaging, review of symptoms, physical exam, discussion of differential diagnosis, discussion of treatment options, and documentation.   Thank you for involving me in the care of this patient.   Drake Leach PA-C Dept. of Neurosurgery

## 2022-12-03 ENCOUNTER — Ambulatory Visit: Payer: Medicare HMO

## 2022-12-03 DIAGNOSIS — M6281 Muscle weakness (generalized): Secondary | ICD-10-CM

## 2022-12-03 DIAGNOSIS — R2689 Other abnormalities of gait and mobility: Secondary | ICD-10-CM

## 2022-12-03 NOTE — Therapy (Signed)
OUTPATIENT PHYSICAL THERAPY LOWER EXTREMITY TREATMENT/Re-certification through 12/24/22   Patient Name: Devin [name]. MRN: [id number] DOB:[birth year], 77 y.o., male Today's Date: 12/03/2022  END OF SESSION:  PT End of Session - 12/03/22 1214     Visit Number 12    Number of Visits 18    Date for PT Re-Evaluation 12/24/22    Authorization Type 2x/week x 6 weeks (re-cert/PN done on 11/27/22 x 4 additional weeks)    PT Start Time 1205    PT Stop Time 1246    PT Time Calculation (min) 41 min    Activity Tolerance Patient tolerated treatment well    Behavior During Therapy WFL for tasks assessed/performed               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   Hypertension    Ischemic cardiomyopathy    MI, old    Peripheral neuropathy    PFO (patent foramen ovale) 05/2019   s/p repair   Sleep apnea    Spinal stenosis of lumbar region    TIA (transient ischemic attack)    Past Surgical History:  Procedure Laterality Date   APPENDECTOMY     BREAST SURGERY Right    benign mass   CARDIOVERSION Right 12/15/2019   Procedure: CARDIOVERSION;  Surgeon: Laurier Nancy, MD;  Location: [facility];  Service: Cardiovascular;  Laterality: Right;   CATARACT EXTRACTION, BILATERAL     COLONOSCOPY     CORONARY ANGIOPLASTY WITH STENT PLACEMENT     CORONARY ARTERY BYPASS GRAFT  05/2019   LIMA-LAD   CORONARY STENT INTERVENTION N/A 10/15/2022   Procedure: CORONARY STENT INTERVENTION;  Surgeon: Marcina Millard, MD;  Location: [facility];  Service: Cardiovascular;  Laterality: N/A;   LEFT HEART CATH N/A 10/15/2022    Procedure: Left Heart Cath;  Surgeon: Marcina Millard, MD;  Location: [facility];  Service: Cardiovascular;  Laterality: N/A;   LEFT HEART CATH AND CORS/GRAFTS ANGIOGRAPHY N/A 05/21/2019   Procedure: LEFT HEART CATH AND CORONARY ANGIOGRAPHY;  Surgeon: Laurier Nancy, MD;  Location: [facility];  Service: Cardiovascular;  Laterality: N/A;   LUMBAR LAMINECTOMY/DECOMPRESSION MICRODISCECTOMY N/A 07/04/2020   Procedure: L4-5 LAMINECTOMY;  Surgeon: Lucy Chris, MD;  Location: [facility];  Service: Neurosurgery;  Laterality: N/A;   REPLACEMENT TOTAL KNEE BILATERAL     Patient Active Problem List   Diagnosis Date Noted   NSTEMI (non-ST elevated myocardial infarction) (HCC) 10/13/2022   HFrEF (heart failure with reduced ejection fraction) (HCC) 10/13/2022   Degenerative joint disease (DJD) of lumbar spine 10/12/2022   Frequent falls 05/15/2022   Unsteady gait 04/16/2022   Acute delirium 04/13/2022   Electrolyte abnormality 04/13/2022   Nausea & vomiting 04/13/2022   PAD (peripheral artery disease) (HCC) 09/03/2021   Localized, primary osteoarthritis 11/24/2020   Wound healing, delayed 09/08/2020   History of lumbar surgery 09/08/2020   Chronic ischemic heart disease 07/13/2020   History of MI (myocardial infarction) 07/13/2020   History of PTCA 1 07/13/2020   Other personal history presenting  hazards to health 07/13/2020   Pure hypercholesterolemia 07/13/2020   Elevated lactic acid level 04/19/2020   Generalized weakness 04/09/2020   Fall at home 04/09/2020   Gastroenteritis 04/09/2020   AMS (altered mental status) 04/08/2020   Hypoxia 02/01/2020   Atrial fibrillation status post cardioversion (HCC) 12/22/2019   Congestive heart failure (CHF) (HCC) 12/13/2019   Hyponatremia 12/06/2019   Acute postoperative anemia due to expected blood loss 05/28/2019   Hyperlipidemia 05/28/2019   Ischemic cardiomyopathy 05/28/2019   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   Unstable angina (HCC) 05/24/2019   Chest pain 05/21/2019   Ischemic chest pain (HCC) 05/16/2019   CAD (coronary artery disease) 05/16/2019   HTN (hypertension) 05/16/2019   Depression 05/16/2019   GERD (gastroesophageal reflux disease) 05/16/2019   Verruca plantaris 01/23/2018   Obstructive sleep apnea syndrome 01/20/2018   Major depressive disorder, single episode, moderate (HCC) 07/05/2016   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 Zada Finders, MD  REFERRING PROVIDER: Dr. Zada Finders, MD/ [name], [facility]  REFERRING DIAG:  R26.9 (ICD-10-CM) - Unspecified abnormalities of gait and mobility  R26.89 (ICD-10-CM) - Other abnormalities of gait and mobility  R29.6 (ICD-10-CM) - Repeated falls    THERAPY DIAG:  Muscle weakness (generalized)  Balance problem  Rationale for Evaluation and Treatment: Rehabilitation  ONSET DATE: at least 2 years  SUBJECTIVE: (From initial evaluation note)  SUBJECTIVE STATEMENT: Pt reports he is having difficulty with gait and balance.  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   He feels like his balance and walking difficulty have worsened recently.  He reports he started falling frequently, he was catching his foot on things and tripping.  The most recent major fall was about 6 months ago.  He is being careful since then and trying to walk more to get stronger and trying to be more aware of his gait pattern.  He was walking with a walker until ~4 months ago, then started using a cane, and then has tried walking without any assistance.      He feels like his difficulty walking  limits his ability to get out of the house as much as he used to because he is fearful of falling.  PLOF- he enjoys getting out of the house for social activities, going out to breakfast too; going grocery shopping a few times/week to get his groceries; prior to his back surgery he walked at the park in [place], hasn't been back though because he is worried about falling though.  He has a home aide that comes a few days a week to assist with household tasks as needed.  Also reports he had a MI a little over a week ago and was in the [name] (8/3-10/16/22)- he states he is referred to begin cardiac rehab as part of his outpatient recovery.  PERTINENT HISTORY: Extensive cardiac history History of lumbar spine surgery- 2022 L4-5 microdiscectomy/laminectomy   PAIN:  Are you having pain? R groin muscle area where he had the cardiac catheterization procedure last week.     PRECAUTIONS: Fall  RED FLAGS: Pt with recent MI (10/13/22) with stent placement (see chart review)  WEIGHT BEARING RESTRICTIONS: No  FALLS:  Has patient fallen in last 6 months? Yes. Number of falls multiple  LIVING ENVIRONMENT: Lives with: lives alone (has a self reported "lady friend" for social support and enjoys being able to get out the house for community activities, going to restaurants together) Lives in: House/apartment Stairs:  he lives in a single level home with bonus room over the garage, but he does not need to go upstairs.1 step to enter in the garage- able to get in without falling. Has following equipment at home: Single point cane, Walker - 4 wheeled, and Wheelchair (manual) Has a home health aide that comes 2-3x/week if he needs assistance  OCCUPATION: retired  PLOF: Independent  PATIENT GOALS: to address drop foot and be able to manage it better; to strengthen his legs; and to increase his breathing; and improve his balance   NEXT MD VISIT: yes, next week   OBJECTIVE:   DIAGNOSTIC FINDINGS: no recent  lower extremity/spine imaging  PATIENT SURVEYS:  FOTO 39/52  COGNITION: Overall cognitive status: Within functional limits for tasks assessed     SENSATION: Pt reports reduced sensation to light touch L L4-5 dermatomes  EDEMA:  No pitting edema noted on dorsum of foot   POSTURE: pt stands with wide base of support for balance; uses UE support on LE's for sit to stand transfer  PALPATION: (-) TTP distal LE b/l  LOWER EXTREMITY ROM:  Active ROM Right eval Left eval  Hip flexion WNL WNL  Hip extension    Hip abduction    Hip adduction    Hip internal rotation    Hip external rotation    Knee flexion 105 105  Knee extension -5 -5  Ankle dorsiflexion 5 deg PROM 0 deg  Ankle plantarflexion    Great toe extension 45 deg PROM 40 deg   Ankle eversion     (Blank rows = not tested)  LOWER EXTREMITY MMT:  MMT Right eval Left eval  Hip flexion 4 4  Hip extension    Hip abduction    Hip adduction    Hip internal rotation    Hip external rotation    Knee flexion 5 5  Knee extension 4 4  Ankle dorsiflexion 4/5 1/5  Ankle plantarflexion 3/5 3/5  Great toe extension 4/5 2/5  Ankle eversion 4/5 3/5   (Blank rows = not tested)   FUNCTIONAL TESTS:  OUTCOME MEASURES: TEST Outcome Interpretation  5 times sit<>stand 21 seconds >60 yo, >15 sec indicates increased risk for falls  10 meter walk test deferred <1.0 m/s indicates increased risk for falls; limited community ambulator  [employer] Assessment /56   deferred <36/56 (100% risk for falls), 37-45 (80% risk for falls); 46-51 (>50% risk for falls); 52-55 (lower risk <25% of falls)      GAIT: Distance walked: from waiting area into PT gym 50 ft, PT supervision Assistive device utilized: None Level of assistance: Complete Independence Comments: Pt ambulates with decreased gait speed, wide BOS, decreased stride length, excess L hip flexion strategy to compensate for L foot drop  Pt not able to stand SLS on L without  UE support today; able to stand R LE x 5 seconds  TODAY'S TREATMENT:  DATE: 12/03/22  Subjective: Patient reports wearing the AFO has felt comfortable.  He has been wearing it since last session.  It is not bothering his L knee to wear it like it used to when he tried it in the past.  He feels like it helps him walk better and he has not caught his toes on any surfaces or obstacles while he's been wearing it.  Pain: 0/10  Objective: Pt arrived wearing his AFO and kept it on throughout session today HR: 62 BPM, SP02% 96%  Therapeutic Exercises: Sit to stand x 5, 2 sets from chair with airex cushion- no use of UE today  Seated knee extension: 3# AW 2x10   Seated marches: 3# AW 2x10  Seated hip adduction ball squeeze 2 x 10 with 3 second hold  Seated hip abd with blue band 2x10 with 5 second holds  Step ups onto 6 inch step: with 2 finger support ea UE; 2x10, lead with L LE (wearing AFO)  SLS balance: 5x 20 second trials on L and R with single UE support and removing hand briefly- more steady on R  Amb on flat surface, no SPC with PT SBA using gait belt 2 laps around gym, focusing on symmetrical step length and turning directions R and L  Blue ladder on floor: PT SBA with gait belt- high knees leading with R then L into square; lateral high knee steps R/L- pt had a few small losses of balance and reached for PT hand for stability  Front step up onto airex: x15 leading with R LE and x15 leading with L LE, b/l UE support- not today  Feet together on airex: 30 seconds x 3, then added head turns R/L and up/down 2x 30 seconds each- pt with intermittent single hand support- not today   Not today:  -Hurdles: forward direction in // bars (PT supervision with gait belt) 6 laps, focused on hip flexion strategy for foot clearance; attempted cue for "lift toes" but pt not  able to actively perform active DF of ankle against gravity -Side step over hurdles in // bars: 6 laps -Airex: feet together, 3 trials x 30-40 seconds with removing hand support -Airex: tandem stance, 3 trials each R in front/L in front x 20-30 seconds removing hand support (pt notes ankle/lower leg fatigue after this) Standing heel raises: 2x10 Seated toe raises (with PT assist on L) 2x15 to L ant tib fatigue Seated Eversion with towel slide seated on floor x20 to fatigue Seated isometric inversion with ball x20 to fatigue Alternating toe taps : 6 inch height, 2 x5 R LE, 2x5 L LE with finger tip support on railing Standing hip abduction 3# AW x 10 each LE- not today  PATIENT EDUCATION:  Education details: PT POC/goals, importance of attending cardiac rehab, activity pacing Person educated: Patient Education method: Explanation Education comprehension: verbalized understanding and needs further education  HOME EXERCISE PROGRAM: Access Code: [email or web address] URL: [email or web address] Date: 10/24/2022 Prepared by: Max Fickle  Exercises - Seated Heel Raise  - 1 x daily - 3 x weekly - 2 sets - 20 reps - Seated March  - 1 x daily - 3 x weekly - 2 sets - 10 reps - Seated Long Arc Quad  - 1 x daily - 3 x weekly - 2 sets - 10 reps  ASSESSMENT:  CLINICAL IMPRESSION:  Patient seems to be tolerating wearing the AFO well since last session.  He arrived wearing it and kept it on for entire  session today.  Focused therapeutic exercises on proximal LE strengthening, balance, and activities that promote safety during ambulation while wearing AFO.  He had a few small losses of balance requiring UE assistance.  Overall, he seems to be adjusting to AFO and it does help improve his gait pattern and improve his safety during amb as he does not have L foot drop gait pattern.  Patient will continue to benefit from skilled PT intervention to address listed impairments to decrease fall risk and  improve overall mobility.   OBJECTIVE IMPAIRMENTS: Abnormal gait, cardiopulmonary status limiting activity, decreased activity tolerance, decreased balance, decreased mobility, difficulty walking, decreased ROM, and decreased strength.   ACTIVITY LIMITATIONS: standing, squatting, stairs, transfers, and locomotion level  PARTICIPATION LIMITATIONS: meal prep, cleaning, laundry, interpersonal relationship, shopping, and community activity  PERSONAL FACTORS: Age, Past/current experiences, Time since onset of injury/illness/exacerbation, and cardiovascular hx including recent MI with stent placement last week  are also affecting patient's functional outcome.   REHAB POTENTIAL: Good  CLINICAL DECISION MAKING: Stable/uncomplicated  EVALUATION COMPLEXITY: Low   GOALS: Goals reviewed with patient? Yes  SHORT TERM GOALS: Target date: 11/11/22 Pt will be able to perform a HEP for LE strengthening >3x/week Baseline: 11/27/22 pt has been instructed on a HEP Goal status: In progress   LONG TERM GOALS: Target date: 12/04/22  Improve FOTO to >51 indicating pt able to perform his daily activities without being limited by his balance Baseline: 39 Goal status: In Progress  2.  Pt will be able to amb with typical width BOS and b/l heel strike gait pattern using least restrictive AD and AFO on flat/incline/decline/grass surfaces x 10 min Baseline: not using any AD or AFO in clinic, pt wide BOS, no L heel strike due to foot drop; 11/27/22 pt is amb with SPC now, discussions continue regarding AFO/brace for improved safety Goal status: INITIAL  3.  Pt will be able to perform 5x STS in <15 sec indicating reduced risk for falling Baseline: 21 sec, 11/27/22 19 sec (with mild UE use) Goal status: INITIAL  PLAN:  PT FREQUENCY: 2x/week  PT DURATION: 4 weeks  PLANNED INTERVENTIONS: Therapeutic exercises, Therapeutic activity, Neuromuscular re-education, Balance training, Gait training, Patient/Family  education, Self Care, Joint mobilization, and Orthotic/Fit training  PLAN FOR NEXT SESSION: continue to assess BP and vitals, continue low intensity LE strength/balance x 1 more month to optimize outcome expected with skilled PT; assess response to AFO; obstacle course- uneven surfaces  Max Fickle, PT, DPT, OCS  Physical Therapist - [facility]   Vinnie Langton [place], PT 12/03/2022, 12:15 PM

## 2022-12-04 ENCOUNTER — Encounter: Payer: Medicare HMO | Admitting: *Deleted

## 2022-12-04 DIAGNOSIS — Z955 Presence of coronary angioplasty implant and graft: Secondary | ICD-10-CM

## 2022-12-04 DIAGNOSIS — I214 Non-ST elevation (NSTEMI) myocardial infarction: Secondary | ICD-10-CM

## 2022-12-04 LAB — GLUCOSE, CAPILLARY
Glucose-Capillary: 143 mg/dL — ABNORMAL HIGH (ref 70–99)
Glucose-Capillary: 113 mg/dL — ABNORMAL HIGH (ref 70–99)

## 2022-12-04 NOTE — Progress Notes (Signed)
Daily Session Note  Patient Details  Name: Devin [name]. MRN: [id number] Date of Birth: September 10, 1945 Referring Provider:   Flowsheet Row Cardiac Rehab from 11/19/2022 in [facility]  Referring Provider Dr. Adrian Blackwater MD       Encounter Date: 12/04/2022  Check In:  Session Check In - 12/04/22 0928       Check-In   Supervising physician immediately available to respond to emergencies See telemetry face sheet for immediately available ER MD    Location [facility]    Staff Present Ronette Deter, BS, Exercise Physiologist;Maxon Conetta BS, , Exercise Physiologist;Deziyah Arvin Katrinka Blazing, RN, ADN    Virtual Visit No    Medication changes reported     No    Fall or balance concerns reported    No    Warm-up and Cool-down Performed on first and last piece of equipment    Resistance Training Performed Yes    VAD Patient? No    PAD/SET Patient? No      Pain Assessment   Currently in Pain? No/denies                Social History   Tobacco Use  Smoking Status Former  Smokeless Tobacco Never  Tobacco Comments   Quit over 40 years ago    Goals Met:  Independence with exercise equipment Exercise tolerated well No report of concerns or symptoms today Strength training completed today  Goals Unmet:  Not Applicable  Comments: Pt able to follow exercise prescription today without complaint.  Will continue to monitor for progression.    Dr. Bethann Punches is Medical Director for [name].  Dr. Vida Rigger is Medical Director for [name].

## 2022-12-05 ENCOUNTER — Encounter: Payer: Self-pay | Admitting: Orthopedic Surgery

## 2022-12-05 ENCOUNTER — Ambulatory Visit: Payer: Medicare HMO | Admitting: Orthopedic Surgery

## 2022-12-05 VITALS — BP 138/72 | Ht 72.0 in | Wt 210.4 lb

## 2022-12-05 DIAGNOSIS — M47816 Spondylosis without myelopathy or radiculopathy, lumbar region: Secondary | ICD-10-CM

## 2022-12-05 DIAGNOSIS — M5136 Other intervertebral disc degeneration, lumbar region: Secondary | ICD-10-CM | POA: Diagnosis not present

## 2022-12-05 DIAGNOSIS — M21372 Foot drop, left foot: Secondary | ICD-10-CM

## 2022-12-05 DIAGNOSIS — Z9889 Other specified postprocedural states: Secondary | ICD-10-CM

## 2022-12-06 ENCOUNTER — Ambulatory Visit: Payer: Medicare HMO

## 2022-12-06 ENCOUNTER — Encounter: Payer: Medicare HMO | Admitting: *Deleted

## 2022-12-06 DIAGNOSIS — Z955 Presence of coronary angioplasty implant and graft: Secondary | ICD-10-CM

## 2022-12-06 DIAGNOSIS — M6281 Muscle weakness (generalized): Secondary | ICD-10-CM

## 2022-12-06 DIAGNOSIS — R2689 Other abnormalities of gait and mobility: Secondary | ICD-10-CM

## 2022-12-06 DIAGNOSIS — I214 Non-ST elevation (NSTEMI) myocardial infarction: Secondary | ICD-10-CM

## 2022-12-06 NOTE — Therapy (Signed)
OUTPATIENT PHYSICAL THERAPY LOWER EXTREMITY TREATMENT/Re-certification through 12/24/22   Patient Name: Devin [name] [facility]. MRN: [id number] DOB:May 05, 1945, 77 y.o., male Today's Date: 12/06/2022  END OF SESSION:  PT End of Session - 12/06/22 1157     Visit Number 13    Number of Visits 18    Date for PT Re-Evaluation 12/24/22    Authorization Type 2x/week x 6 weeks (re-cert/PN done on 11/27/22 x 4 additional weeks)    PT Start Time 1200    PT Stop Time 1245    PT Time Calculation (min) 45 min    Activity Tolerance Patient tolerated treatment well    Behavior During Therapy WFL for tasks assessed/performed               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   Hypertension    Ischemic cardiomyopathy    MI, old    Peripheral neuropathy    PFO (patent foramen ovale) 05/2019   s/p repair   Sleep apnea    Spinal stenosis of lumbar region    TIA (transient ischemic attack)    Past Surgical History:  Procedure Laterality Date   APPENDECTOMY     BREAST SURGERY Right    benign mass   CARDIOVERSION Right 12/15/2019   Procedure: CARDIOVERSION;  Surgeon: Laurier Nancy, MD;  Location: [facility] ORS;  Service: Cardiovascular;  Laterality: Right;   CATARACT EXTRACTION, BILATERAL     COLONOSCOPY     CORONARY ANGIOPLASTY WITH STENT PLACEMENT     CORONARY ARTERY BYPASS GRAFT  05/2019   LIMA-LAD   CORONARY STENT INTERVENTION N/A 10/15/2022   Procedure: CORONARY STENT INTERVENTION;  Surgeon: Marcina Millard, MD;  Location: [facility] INVASIVE CV LAB;  Service: Cardiovascular;  Laterality: N/A;   LEFT HEART CATH N/A 10/15/2022    Procedure: Left Heart Cath;  Surgeon: Marcina Millard, MD;  Location: [facility] INV[facility]IVE CV [facility];  Service: Cardiovascular;  Laterality: N/A;   LEFT HEART CATH AND CORS/GRAFTS ANGIOGRAPHY N/A 05/21/2019   Procedure: LEFT HEART CATH AND CORONARY ANGIOGRAPHY;  Surgeon: Laurier Nancy, MD;  Location: [facility] INV[facility]IVE CV [facility];  Service: Cardiovascular;  Laterality: N/A;   LUMBAR LAMINECTOMY/DECOMPRESSION MICRODISCECTOMY N/A 07/04/2020   Procedure: L4-5 LAMINECTOMY;  Surgeon: Lucy Chris, MD;  Location: [facility] ORS;  Service: Neurosurgery;  Laterality: N/A;   REPLACEMENT TOTAL KNEE BILATERAL     Patient Active Problem List   Diagnosis Date Noted   NSTEMI (non-ST elevated myocardial infarction) (HCC) 10/13/2022   HFrEF (heart failure with reduced ejection fraction) (HCC) 10/13/2022   Degenerative joint disease (DJD) of lumbar spine 10/12/2022   Frequent falls 05/15/2022   Unsteady gait 04/16/2022   Acute delirium 04/13/2022   Electrolyte abnormality 04/13/2022   Nausea & vomiting 04/13/2022   PAD (peripheral artery disease) (HCC) 09/03/2021   Localized, primary osteoarthritis 11/24/2020   Wound healing, delayed 09/08/2020   History of lumbar surgery 09/08/2020   Chronic ischemic heart disease 07/13/2020   History of MI (myocardial infarction) 07/13/2020   History of PTCA 1 07/13/2020   Other personal history presenting  hazards to health 07/13/2020   Pure hypercholesterolemia 07/13/2020   Elevated lactic acid level 04/19/2020   Generalized weakness 04/09/2020   Fall at home 04/09/2020   Gastroenteritis 04/09/2020   AMS (altered mental status) 04/08/2020   Hypoxia 02/01/2020   Atrial fibrillation status post cardioversion (HCC) 12/22/2019   Congestive heart failure (CHF) (HCC) 12/13/2019   Hyponatremia 12/06/2019   Acute postoperative anemia due to expected blood loss 05/28/2019   Hyperlipidemia 05/28/2019   Ischemic cardiomyopathy 05/28/2019   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   Unstable angina (HCC) 05/24/2019   Chest pain 05/21/2019   Ischemic chest pain (HCC) 05/16/2019   CAD (coronary artery disease) 05/16/2019   HTN (hypertension) 05/16/2019   Depression 05/16/2019   GERD (gastroesophageal reflux disease) 05/16/2019   Verruca plantaris 01/23/2018   Obstructive sleep apnea syndrome 01/20/2018   Major depressive disorder, single episode, moderate (HCC) 07/05/2016   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 Zada Finders, MD  REFERRING PROVIDER: Dr. Zada Finders, MD/ [name]  REFERRING DIAG:  R26.9 (ICD-10-CM) - Unspecified abnormalities of gait and mobility  R26.89 (ICD-10-CM) - Other abnormalities of gait and mobility  R29.6 (ICD-10-CM) - Repeated falls    THERAPY DIAG:  Muscle weakness (generalized)  Balance problem  Rationale for Evaluation and Treatment: Rehabilitation  ONSET DATE: at least 2 years  SUBJECTIVE: (From initial evaluation note)  SUBJECTIVE STATEMENT: Pt reports he is having difficulty with gait and balance.  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   He feels like his balance and walking difficulty have worsened recently.  He reports he started falling frequently, he was catching his foot on things and tripping.  The most recent major fall was about 6 months ago.  He is being careful since then and trying to walk more to get stronger and trying to be more aware of his gait pattern.  He was walking with a walker until ~4 months ago, then started using a cane, and then has tried walking without any assistance.      He feels like his difficulty walking  limits his ability to get out of the house as much as he used to because he is fearful of falling.  PLOF- he enjoys getting out of the house for social activities, going out to breakfast too; going grocery shopping a few times/week to get his groceries; prior to his back surgery he walked at the park in [place], hasn't been back though because he is worried about falling though.  He has a home aide that comes a few days a week to assist with household tasks as needed.  Also reports he had a MI a little over a week ago and was in the hospital (8/3-10/16/22)- he states he is referred to begin cardiac rehab as part of his outpatient recovery.  PERTINENT HISTORY: Extensive cardiac history History of lumbar spine surgery- 2022 L4-5 microdiscectomy/laminectomy   PAIN:  Are you having pain? R groin muscle area where he had the cardiac catheterization procedure last week.     PRECAUTIONS: Fall  RED FLAGS: Pt with recent MI (10/13/22) with stent placement (see chart review)  WEIGHT BEARING RESTRICTIONS: No  FALLS:  Has patient fallen in last 6 months? Yes. Number of falls multiple  LIVING ENVIRONMENT: Lives with: lives alone (has a self reported "lady friend" for social support and enjoys being able to get out the house for community activities, going to restaurants together) Lives in: House/apartment Stairs:  he lives in a single level home with bonus room over the garage, but he does not need to go upstairs.1 step to enter in the garage- able to get in without falling. Has following equipment at home: Single point cane, Walker - 4 wheeled, and Wheelchair (manual) Has a home health aide that comes 2-3x/week if he needs assistance  OCCUPATION: retired  PLOF: Independent  PATIENT GOALS: to address drop foot and be able to manage it better; to strengthen his legs; and to increase his breathing; and improve his balance   NEXT MD VISIT: yes, next week   OBJECTIVE:   DIAGNOSTIC FINDINGS: no recent  lower extremity/spine imaging  PATIENT SURVEYS:  FOTO 39/52  COGNITION: Overall cognitive status: Within functional limits for tasks assessed     SENSATION: Pt reports reduced sensation to light touch L L4-5 dermatomes  EDEMA:  No pitting edema noted on dorsum of foot   POSTURE: pt stands with wide base of support for balance; uses UE support on LE's for sit to stand transfer  PALPATION: (-) TTP distal LE b/l  LOWER EXTREMITY ROM:  Active ROM Right eval Left eval  Hip flexion WNL WNL  Hip extension    Hip abduction    Hip adduction    Hip internal rotation    Hip external rotation    Knee flexion 105 105  Knee extension -5 -5  Ankle dorsiflexion 5 deg PROM 0 deg  Ankle plantarflexion    Great toe extension 45 deg PROM 40 deg   Ankle eversion     (Blank rows = not tested)  LOWER EXTREMITY MMT:  MMT Right eval Left eval  Hip flexion 4 4  Hip extension    Hip abduction    Hip adduction    Hip internal rotation    Hip external rotation    Knee flexion 5 5  Knee extension 4 4  Ankle dorsiflexion 4/5 1/5  Ankle plantarflexion 3/5 3/5  Great toe extension 4/5 2/5  Ankle eversion 4/5 3/5   (Blank rows = not tested)   FUNCTIONAL TESTS:  OUTCOME MEASURES: TEST Outcome Interpretation  5 times sit<>stand 21 seconds >60 yo, >15 sec indicates increased risk for falls  10 meter walk test deferred <1.0 m/s indicates increased risk for falls; limited community ambulator  [employer] Assessment /56   deferred <36/56 (100% risk for falls), 37-45 (80% risk for falls); 46-51 (>50% risk for falls); 52-55 (lower risk <25% of falls)      GAIT: Distance walked: from waiting area into PT gym 50 ft, PT supervision Assistive device utilized: None Level of assistance: Complete Independence Comments: Pt ambulates with decreased gait speed, wide BOS, decreased stride length, excess L hip flexion strategy to compensate for L foot drop  Pt not able to stand SLS on L without  UE support today; able to stand R LE x 5 seconds  TODAY'S TREATMENT:  DATE: 12/06/22  Subjective: Patient reports he is overall doing well today.  He had cardiac rehab this morning before coming to PT.  He is a little tired upon arrival.  He hasn't used the cane this week.  The AFO is mostly comfortable.     Pain: 0/10  Objective: Pt arrived wearing his AFO and kept it on throughout session today HR: 62 BPM, SP02% 96%  Therapeutic Exercises: While wearing L AFO, no SPC during session, gait belt and PT supervision/SBA during standing exercises  Sit to stand x 5, 2 sets from chair with airex cushion- no use of UE   Seated knee extension: 4# AW 2x10   Seated marches: 4# AW 2x10  Seated hip adduction ball squeeze 2 x 10 with 3 second hold  Seated hip abd with blue band 2x10 with 5 second holds  Step ups onto 6 inch step: with 2 finger support ea UE; x6 lead with L LE, x6 leading with R LE  Standing alternating toe taps to green cones: sets of 12 alternating- PT supervision with gait belt  Alternating step ups on airex: x6 lead with R and x6 lead with L- in parallel bars  Vitals at end of session: HR 66 SP02% 98%  Not today:  -Hurdles: forward direction in // bars (PT supervision with gait belt) 6 laps, focused on hip flexion strategy for foot clearance; attempted cue for "lift toes" but pt not able to actively perform active DF of ankle against gravity -Side step over hurdles in // bars: 6 laps -Airex: feet together, 3 trials x 30-40 seconds with removing hand support -Airex: tandem stance, 3 trials each R in front/L in front x 20-30 seconds removing hand support (pt notes ankle/lower leg fatigue after this) Standing heel raises: 2x10 Seated toe raises (with PT assist on L) 2x15 to L ant tib fatigue Seated Eversion with towel slide seated on floor x20 to  fatigue Seated isometric inversion with ball x20 to fatigue Alternating toe taps : 6 inch height, 2 x5 R LE, 2x5 L LE with finger tip support on railing Standing hip abduction 3# AW x 10 each LE- not today  PATIENT EDUCATION:  Education details: PT POC/goals, importance of attending cardiac rehab, activity pacing Person educated: Patient Education method: Explanation Education comprehension: verbalized understanding and needs further education  HOME EXERCISE PROGRAM: Access Code: [email or web address] URL: [email or web address] Date: 10/24/2022 Prepared by: Max Fickle  Exercises - Seated Heel Raise  - 1 x daily - 3 x weekly - 2 sets - 20 reps - Seated March  - 1 x daily - 3 x weekly - 2 sets - 10 reps - Seated Long Arc Quad  - 1 x daily - 3 x weekly - 2 sets - 10 reps  ASSESSMENT:  CLINICAL IMPRESSION:  Patient was a little more fatigued during session today as he had PT later in the day after cardiac rehab; therefore held on obstacle course type activities today.  He was challenged with standing dynamic balance and strengthening exercises and had a few episodes of loss of balance during cone toe taps and front step ups onto step.  Able to regain balance with hand on railing or stepping strategy.  Overall, he seems to be adjusting to AFO and it does help improve his gait pattern and improve his safety during amb as he does not have L foot drop gait pattern.  Patient will continue to benefit from skilled PT intervention to address listed impairments to decrease fall  risk and improve overall mobility.   OBJECTIVE IMPAIRMENTS: Abnormal gait, cardiopulmonary status limiting activity, decreased activity tolerance, decreased balance, decreased mobility, difficulty walking, decreased ROM, and decreased strength.   ACTIVITY LIMITATIONS: standing, squatting, stairs, transfers, and locomotion level  PARTICIPATION LIMITATIONS: meal prep, cleaning, laundry, interpersonal relationship,  shopping, and community activity  PERSONAL FACTORS: Age, Past/current experiences, Time since onset of injury/illness/exacerbation, and cardiovascular hx including recent MI with stent placement last week  are also affecting patient's functional outcome.   REHAB POTENTIAL: Good  CLINICAL DECISION MAKING: Stable/uncomplicated  EVALUATION COMPLEXITY: Low   GOALS: Goals reviewed with patient? Yes  SHORT TERM GOALS: Target date: 11/11/22 Pt will be able to perform a HEP for LE strengthening >3x/week Baseline: 11/27/22 pt has been instructed on a HEP Goal status: In progress   LONG TERM GOALS: Target date: 12/04/22  Improve FOTO to >51 indicating pt able to perform his daily activities without being limited by his balance Baseline: 39 Goal status: In Progress  2.  Pt will be able to amb with typical width BOS and b/l heel strike gait pattern using least restrictive AD and AFO on flat/incline/decline/grass surfaces x 10 min Baseline: not using any AD or AFO in clinic, pt wide BOS, no L heel strike due to foot drop; 11/27/22 pt is amb with SPC now, discussions continue regarding AFO/brace for improved safety Goal status: INITIAL  3.  Pt will be able to perform 5x STS in <15 sec indicating reduced risk for falling Baseline: 21 sec, 11/27/22 19 sec (with mild UE use) Goal status: INITIAL  PLAN:  PT FREQUENCY: 2x/week  PT DURATION: 4 weeks  PLANNED INTERVENTIONS: Therapeutic exercises, Therapeutic activity, Neuromuscular re-education, Balance training, Gait training, Patient/Family education, Self Care, Joint mobilization, and Orthotic/Fit training  PLAN FOR NEXT SESSION: continue to assess BP and vitals, continue low intensity LE strength/balance x 1 more month to optimize outcome expected with skilled PT; assess response to AFO; obstacle course- uneven surfaces next session  Max Fickle, PT, DPT, OCS  Physical Therapist - [facility]   Ardine Bjork, PT 12/06/2022, 11:57 AM

## 2022-12-06 NOTE — Progress Notes (Signed)
Daily Session Note  Patient Details  Name: Carlosdaniel Overdorf [facility]. MRN: [id number] Date of Birth: 07/22/1945 Referring Provider:   Flowsheet Row Cardiac Rehab from 11/19/2022 in [facility] Cardiac [facility]  Referring Provider Dr. Adrian Blackwater MD       Encounter Date: 12/06/2022  Check In:  Session Check In - 12/06/22 1004       Check-In   Supervising physician immediately available to respond to emergencies See telemetry face sheet for immediately available ER MD    Location [facility]-Cardiac & Pulmonary Rehab    Staff Present Hulen Luster, BS, RRT, CPFT;Maxon Conetta BS, , Exercise Physiologist;Finnean Cerami Katrinka Blazing, RN, ADN;Meredith Jewel Baize, RN BSN    Virtual Visit No    Medication changes reported     No    Fall or balance concerns reported    No    Warm-up and Cool-down Performed on first and last piece of equipment    Resistance Training Performed Yes    VAD Patient? No    PAD/SET Patient? No      Pain Assessment   Currently in Pain? No/denies                Social History   Tobacco Use  Smoking Status Former  Smokeless Tobacco Never  Tobacco Comments   Quit over 40 years ago    Goals Met:  Independence with exercise equipment Exercise tolerated well No report of concerns or symptoms today Strength training completed today  Goals Unmet:  Not Applicable  Comments: Pt able to follow exercise prescription today without complaint.  Will continue to monitor for progression.    Dr. Bethann Punches is Medical Director for [facility] Cardiac Rehabilitation.  Dr. Vida Rigger is Medical Director for [facility] Pulmonary Rehabilitation.

## 2022-12-07 ENCOUNTER — Ambulatory Visit: Payer: Medicare HMO | Admitting: Cardiovascular Disease

## 2022-12-07 ENCOUNTER — Encounter: Payer: Self-pay | Admitting: Cardiovascular Disease

## 2022-12-07 VITALS — BP 120/71 | HR 65 | Ht 71.0 in | Wt 208.2 lb

## 2022-12-07 DIAGNOSIS — I4891 Unspecified atrial fibrillation: Secondary | ICD-10-CM | POA: Diagnosis not present

## 2022-12-07 DIAGNOSIS — I25700 Atherosclerosis of coronary artery bypass graft(s), unspecified, with unstable angina pectoris: Secondary | ICD-10-CM

## 2022-12-07 DIAGNOSIS — I739 Peripheral vascular disease, unspecified: Secondary | ICD-10-CM

## 2022-12-07 DIAGNOSIS — I2510 Atherosclerotic heart disease of native coronary artery without angina pectoris: Secondary | ICD-10-CM

## 2022-12-07 DIAGNOSIS — I7143 Infrarenal abdominal aortic aneurysm, without rupture: Secondary | ICD-10-CM

## 2022-12-07 MED ORDER — APIXABAN 5 MG PO TABS
5.0000 mg | ORAL_TABLET | Freq: Two times a day (BID) | ORAL | 3 refills | Status: DC
Start: 2022-12-07 — End: 2023-07-30

## 2022-12-07 NOTE — Progress Notes (Signed)
Cardiology Office Note   Date:  12/07/2022   ID:  Devin Lank Tilden Broz., DOB [birth year], MRN [id number]  PCP:  Dione Housekeeper, MD  Cardiologist:  Adrian Blackwater, MD      History of Present Illness: Devin Concannon. is a 77 y.o. 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   Hypertension    Ischemic cardiomyopathy    MI, old    Peripheral neuropathy    PFO (patent foramen ovale) 05/2019   s/p repair   Sleep apnea    Spinal stenosis of lumbar region    TIA (transient ischemic attack)      Past Surgical History:  Procedure Laterality Date   APPENDECTOMY     BREAST SURGERY Right    benign mass   CARDIOVERSION Right 12/15/2019   Procedure: CARDIOVERSION;  Surgeon: Laurier Nancy, MD;  Location: [facility];  Service: Cardiovascular;  Laterality: Right;   CATARACT EXTRACTION, BILATERAL     COLONOSCOPY     CORONARY ANGIOPLASTY WITH STENT PLACEMENT     CORONARY ARTERY BYPASS GRAFT  05/2019   LIMA-LAD   CORONARY STENT INTERVENTION N/A 10/15/2022   Procedure: CORONARY STENT INTERVENTION;  Surgeon: Marcina Millard, MD;  Location: [facility];  Service: Cardiovascular;  Laterality: N/A;   LEFT HEART CATH N/A 10/15/2022   Procedure: Left Heart Cath;  Surgeon: Marcina Millard, MD;  Location: [facility];  Service: Cardiovascular;  Laterality: N/A;   LEFT HEART CATH AND CORS/GRAFTS ANGIOGRAPHY N/A 05/21/2019   Procedure: LEFT HEART CATH AND CORONARY ANGIOGRAPHY;   Surgeon: Laurier Nancy, MD;  Location: [facility];  Service: Cardiovascular;  Laterality: N/A;   LUMBAR LAMINECTOMY/DECOMPRESSION MICRODISCECTOMY N/A 07/04/2020   Procedure: L4-5 LAMINECTOMY;  Surgeon: Lucy Chris, MD;  Location: [facility];  Service: Neurosurgery;  Laterality: N/A;   REPLACEMENT TOTAL KNEE BILATERAL       Current Outpatient Medications  Medication Sig Dispense Refill   albuterol (VENTOLIN HFA) 108 (90 Base) MCG/ACT inhaler Inhale 2 puffs into the lungs every 6 (six) hours as needed for wheezing or shortness of breath.     amiodarone (PACERONE) 200 MG tablet Take 200 mg by mouth daily.     amLODipine (NORVASC) 10 MG tablet Take 1 tablet by mouth daily.     apixaban (ELIQUIS) 5 MG TABS tablet Take 1 tablet (5 mg total) by mouth 2 (two) times daily. 60 tablet 3   atorvastatin (LIPITOR) 80 MG tablet Take 80 mg by mouth daily.     buPROPion (WELLBUTRIN XL) 300 MG 24 hr tablet Take 300 mg by mouth daily.     cephALEXin (KEFLEX) 500 MG capsule Take 500 mg by mouth 3 (three) times daily.     clopidogrel (PLAVIX) 75 MG tablet Take 1 tablet (75 mg total) by mouth daily with breakfast. 30 tablet 11   docusate (  COLACE) 60 MG/15ML syrup Take 60 mg by mouth daily.     EPINEPHrine 0.3 mg/0.3 mL IJ SOAJ injection Inject 0.3 mg into the muscle as needed for anaphylaxis.     ezetimibe (ZETIA) 10 MG tablet Take 10 mg by mouth daily.     fluticasone (FLONASE) 50 MCG/ACT nasal spray SPRAY 2 SPRAYS INTO EACH NOSTRIL EVERY DAY     gabapentin (NEURONTIN) 300 MG capsule Take 300 mg by mouth 2 (two) times daily.     loratadine (CLARITIN) 10 MG tablet Take 10 mg by mouth daily.     metoprolol succinate (TOPROL XL) 25 MG 24 hr tablet Take 1 tablet (25 mg total) by mouth daily. 30 tablet 11   omeprazole (PRILOSEC OTC) 20 MG tablet Take 20 mg by mouth daily.     sacubitril-valsartan (ENTRESTO) 97-103 MG Hold until followup with Dr. Welton Flakes due to low blood pressure.     metFORMIN (GLUCOPHAGE)  500 MG tablet Take 1,000 mg by mouth 2 (two) times daily. (Patient not taking: Reported on 12/07/2022)     nitroGLYCERIN (NITROSTAT) 0.4 MG SL tablet Place 0.4 mg under the tongue every 5 (five) minutes as needed for chest pain. (Patient not taking: Reported on 12/07/2022)     No current facility-administered medications for this visit.    Allergies:   Morphine, Yellow jacket venom [bee venom], and Trazodone    Social History:   reports that he has quit smoking. He has never used smokeless tobacco. He reports that he does not currently use alcohol. He reports that he does not use drugs.   Family History:  family history includes Osteoarthritis in his mother; Other (age of onset: 74) in his father.    ROS:     Review of Systems  Constitutional: Negative.   HENT: Negative.    Eyes: Negative.   Respiratory: Negative.    Gastrointestinal: Negative.   Genitourinary: Negative.   Musculoskeletal: Negative.   Skin: Negative.   Neurological: Negative.   Endo/Heme/Allergies: Negative.   Psychiatric/Behavioral: Negative.    All other systems reviewed and are negative.     All other systems are reviewed and negative.    PHYSICAL EXAM: VS:  BP 120/71   Pulse 65   Ht 5\' 11"  (1.803 m)   Wt 208 lb 3.2 oz (94.4 kg)   SpO2 98%   BMI 29.04 kg/m  , BMI Body mass index is 29.04 kg/m. Last weight:  Wt Readings from Last 3 Encounters:  12/07/22 208 lb 3.2 oz (94.4 kg)  12/05/22 210 lb 6.4 oz (95.4 kg)  11/19/22 205 lb 3.2 oz (93.1 kg)     Physical Exam Vitals reviewed.  Constitutional:      Appearance: Normal appearance. He is normal weight.  HENT:     Head: Normocephalic.     Nose: Nose normal.     Mouth/Throat:     Mouth: Mucous membranes are moist.  Eyes:     Pupils: Pupils are equal, round, and reactive to light.  Cardiovascular:     Rate and Rhythm: Normal rate and regular rhythm.     Pulses: Normal pulses.     Heart sounds: Normal heart sounds.  Pulmonary:     Effort:  Pulmonary effort is normal.  Abdominal:     General: Abdomen is flat. Bowel sounds are normal.  Musculoskeletal:        General: Normal range of motion.     Cervical back: Normal range of motion.  Skin:  General: Skin is warm.  Neurological:     General: No focal deficit present.     Mental Status: He is alert.  Psychiatric:        Mood and Affect: Mood normal.       EKG:   Recent Labs: 04/13/2022: ALT 30 10/16/2022: BUN 10; Creatinine, Ser 0.89; Hemoglobin 13.8; Magnesium 2.3; Platelets 336; Potassium 4.1; Sodium 139    Lipid Panel    Component Value Date/Time   CHOL 105 10/13/2022 0313   TRIG 55 10/13/2022 0313   HDL 59 10/13/2022 0313   CHOLHDL 1.8 10/13/2022 0313   VLDL 11 10/13/2022 0313   LDLCALC 35 10/13/2022 0313      Other studies Reviewed: Additional studies/ records that were reviewed today include:  Review of the above records demonstrates:       No data to display            ASSESSMENT AND PLAN:    ICD-10-CM   1. Coronary artery disease involving native coronary artery of native heart without angina pectoris  I25.10 apixaban (ELIQUIS) 5 MG TABS tablet    2. Atrial fibrillation status post cardioversion (HCC)  I48.91     3. Infrarenal abdominal aortic aneurysm (AAA) without rupture (HCC)  I71.43     4. PAD (peripheral artery disease) (HCC)  I73.9     5. Coronary artery disease involving coronary bypass graft with unstable angina pectoris, unspecified whether native or transplanted heart (HCC)  I25.700    Had osteal, LCX stent aand needs to be on plavix but now is having severe echymosis on arms, thus will stop asp 81 as on Elliquis 5 bid now. He is high risk .       Problem List Items Addressed This Visit       Cardiovascular and Mediastinum   CAD (coronary artery disease) - Primary   Relevant Medications   apixaban (ELIQUIS) 5 MG TABS tablet   AAA (abdominal aortic aneurysm) (HCC)   Relevant Medications   apixaban (ELIQUIS) 5 MG  TABS tablet   Atrial fibrillation status post cardioversion (HCC)   Relevant Medications   apixaban (ELIQUIS) 5 MG TABS tablet   PAD (peripheral artery disease) (HCC)   Relevant Medications   apixaban (ELIQUIS) 5 MG TABS tablet       Disposition:   Return in about 6 weeks (around 01/18/2023).    Total time spent: 30 minutes  Signed,  Adrian Blackwater, MD  12/07/2022 2:46 PM    [facility]

## 2022-12-10 ENCOUNTER — Encounter: Payer: Medicare HMO | Admitting: *Deleted

## 2022-12-10 ENCOUNTER — Ambulatory Visit: Payer: Medicare HMO

## 2022-12-10 DIAGNOSIS — I214 Non-ST elevation (NSTEMI) myocardial infarction: Secondary | ICD-10-CM | POA: Diagnosis not present

## 2022-12-10 DIAGNOSIS — R2689 Other abnormalities of gait and mobility: Secondary | ICD-10-CM

## 2022-12-10 DIAGNOSIS — M6281 Muscle weakness (generalized): Secondary | ICD-10-CM

## 2022-12-10 NOTE — Progress Notes (Signed)
Daily Session Note  Patient Details  Name: Devin [name] [facility]. MRN: [id number] Date of Birth: 11/10/1945 Referring Provider:   Flowsheet Row Cardiac Rehab from 11/19/2022 in [facility]  Referring Provider Dr. Adrian Blackwater MD       Encounter Date: 12/10/2022  Check In:  Session Check In - 12/10/22 0930       Check-In   Supervising physician immediately available to respond to emergencies See telemetry face sheet for immediately available ER MD    Location [facility]    Staff Present Maxon Conetta BS, , Exercise Physiologist;Kelly Madilyn Fireman, BS, ACSM CEP, Exercise Physiologist;Mare Ludtke Katrinka Blazing, RN, ADN    Virtual Visit No    Medication changes reported     No    Fall or balance concerns reported    No    Warm-up and Cool-down Performed on first and last piece of equipment    Resistance Training Performed Yes    VAD Patient? No    PAD/SET Patient? No      Pain Assessment   Currently in Pain? No/denies                Social History   Tobacco Use  Smoking Status Former  Smokeless Tobacco Never  Tobacco Comments   Quit over 40 years ago    Goals Met:  Independence with exercise equipment Exercise tolerated well No report of concerns or symptoms today Strength training completed today  Goals Unmet:  Not Applicable  Comments: Pt able to follow exercise prescription today without complaint.  Will continue to monitor for progression.    Dr. Bethann Punches is Medical Director for [facility] Cardiac Rehabilitation.  Dr. Vida Rigger is Medical Director for [facility].

## 2022-12-10 NOTE — Therapy (Signed)
OUTPATIENT PHYSICAL THERAPY LOWER EXTREMITY TREATMENT/Re-certification through 12/24/22   Patient Name: Devin [name] [facility]. MRN: [id number] DOB:07-02-1945, 77 y.o., male Today's Date: 12/10/2022  END OF SESSION:  PT End of Session - 12/10/22 1213     Visit Number 14    Number of Visits 18    Date for PT Re-Evaluation 12/24/22    Authorization Type 2x/week x 6 weeks (re-cert/PN done on 11/27/22 x 4 additional weeks)    PT Start Time 1200    PT Stop Time 1245    PT Time Calculation (min) 45 min    Activity Tolerance Patient tolerated treatment well    Behavior During Therapy WFL for tasks assessed/performed               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   Hypertension    Ischemic cardiomyopathy    MI, old    Peripheral neuropathy    PFO (patent foramen ovale) 05/2019   s/p repair   Sleep apnea    Spinal stenosis of lumbar region    TIA (transient ischemic attack)    Past Surgical History:  Procedure Laterality Date   APPENDECTOMY     BREAST SURGERY Right    benign mass   CARDIOVERSION Right 12/15/2019   Procedure: CARDIOVERSION;  Surgeon: Laurier Nancy, MD;  Location: [facility] ORS;  Service: Cardiovascular;  Laterality: Right;   CATARACT EXTRACTION, BILATERAL     COLONOSCOPY     CORONARY ANGIOPLASTY WITH STENT PLACEMENT     CORONARY ARTERY BYPASS GRAFT  05/2019   LIMA-LAD   CORONARY STENT INTERVENTION N/A 10/15/2022   Procedure: CORONARY STENT INTERVENTION;  Surgeon: Marcina Millard, MD;  Location: [facility] IN[facility]ASIVE CV LAB;  Service: Cardiovascular;  Laterality: N/A;   LEFT HEART CATH N/A 10/15/2022    Procedure: Left Heart Cath;  Surgeon: Marcina Millard, MD;  Location: [facility] INVASIVE CV [facility];  Service: Cardiovascular;  Laterality: N/A;   LEFT HEART CATH AND CORS/GRAFTS ANGIOGRAPHY N/A 05/21/2019   Procedure: LEFT HEART CATH AND CORONARY ANGIOGRAPHY;  Surgeon: Laurier Nancy, MD;  Location: [facility] INV[facility]IVE CV LAB;  Service: Cardiovascular;  Laterality: N/A;   LUMBAR LAMINECTOMY/DECOMPRESSION MICRODISCECTOMY N/A 07/04/2020   Procedure: L4-5 LAMINECTOMY;  Surgeon: Lucy Chris, MD;  Location: [facility] ORS;  Service: Neurosurgery;  Laterality: N/A;   REPLACEMENT TOTAL KNEE BILATERAL     Patient Active Problem List   Diagnosis Date Noted   NSTEMI (non-ST elevated myocardial infarction) (HCC) 10/13/2022   HFrEF (heart failure with reduced ejection fraction) (HCC) 10/13/2022   Degenerative joint disease (DJD) of lumbar spine 10/12/2022   Frequent falls 05/15/2022   Unsteady gait 04/16/2022   Acute delirium 04/13/2022   Electrolyte abnormality 04/13/2022   Nausea & vomiting 04/13/2022   PAD (peripheral artery disease) (HCC) 09/03/2021   Localized, primary osteoarthritis 11/24/2020   Wound healing, delayed 09/08/2020   History of lumbar surgery 09/08/2020   Chronic ischemic heart disease 07/13/2020   History of MI (myocardial infarction) 07/13/2020   History of PTCA 1 07/13/2020   Other personal history presenting  hazards to health 07/13/2020   Pure hypercholesterolemia 07/13/2020   Elevated lactic acid level 04/19/2020   Generalized weakness 04/09/2020   Fall at home 04/09/2020   Gastroenteritis 04/09/2020   AMS (altered mental status) 04/08/2020   Hypoxia 02/01/2020   Atrial fibrillation status post cardioversion (HCC) 12/22/2019   Congestive heart failure (CHF) (HCC) 12/13/2019   Hyponatremia 12/06/2019   Acute postoperative anemia due to expected blood loss 05/28/2019   Hyperlipidemia 05/28/2019   Ischemic cardiomyopathy 05/28/2019   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   Unstable angina (HCC) 05/24/2019   Chest pain 05/21/2019   Ischemic chest pain (HCC) 05/16/2019   CAD (coronary artery disease) 05/16/2019   HTN (hypertension) 05/16/2019   Depression 05/16/2019   GERD (gastroesophageal reflux disease) 05/16/2019   Verruca plantaris 01/23/2018   Obstructive sleep apnea syndrome 01/20/2018   Major depressive disorder, single episode, moderate (HCC) 07/05/2016   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 Zada Finders, MD  REFERRING PROVIDER: Dr. Zada Finders, MD/ [name], [facility]  REFERRING DIAG:  R26.9 (ICD-10-CM) - Unspecified abnormalities of gait and mobility  R26.89 (ICD-10-CM) - Other abnormalities of gait and mobility  R29.6 (ICD-10-CM) - Repeated falls    THERAPY DIAG:  Muscle weakness (generalized)  Balance problem  Rationale for Evaluation and Treatment: Rehabilitation  ONSET DATE: at least 2 years  SUBJECTIVE: (From initial evaluation note)  SUBJECTIVE STATEMENT: Pt reports he is having difficulty with gait and balance.  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   He feels like his balance and walking difficulty have worsened recently.  He reports he started falling frequently, he was catching his foot on things and tripping.  The most recent major fall was about 6 months ago.  He is being careful since then and trying to walk more to get stronger and trying to be more aware of his gait pattern.  He was walking with a walker until ~4 months ago, then started using a cane, and then has tried walking without any assistance.      He feels like his difficulty walking  limits his ability to get out of the house as much as he used to because he is fearful of falling.  PLOF- he enjoys getting out of the house for social activities, going out to breakfast too; going grocery shopping a few times/week to get his groceries; prior to his back surgery he walked at the park in [place], hasn't been back though because he is worried about falling though.  He has a home aide that comes a few days a week to assist with household tasks as needed.  Also reports he had a MI a little over a week ago and was in the hospital (8/3-10/16/22)- he states he is referred to begin cardiac rehab as part of his outpatient recovery.  PERTINENT HISTORY: Extensive cardiac history History of lumbar spine surgery- 2022 L4-5 microdiscectomy/laminectomy   PAIN:  Are you having pain? R groin muscle area where he had the cardiac catheterization procedure last week.     PRECAUTIONS: Fall  RED FLAGS: Pt with recent MI (10/13/22) with stent placement (see chart review)  WEIGHT BEARING RESTRICTIONS: No  FALLS:  Has patient fallen in last 6 months? Yes. Number of falls multiple  LIVING ENVIRONMENT: Lives with: lives alone (has a self reported "lady friend" for social support and enjoys being able to get out the house for community activities, going to restaurants together) Lives in: House/apartment Stairs:  he lives in a single level home with bonus room over the garage, but he does not need to go upstairs.1 step to enter in the garage- able to get in without falling. Has following equipment at home: Single point cane, Walker - 4 wheeled, and Wheelchair (manual) Has a home health aide that comes 2-3x/week if he needs assistance  OCCUPATION: retired  PLOF: Independent  PATIENT GOALS: to address drop foot and be able to manage it better; to strengthen his legs; and to increase his breathing; and improve his balance   NEXT MD VISIT: yes, next week   OBJECTIVE:   DIAGNOSTIC FINDINGS: no recent  lower extremity/spine imaging  PATIENT SURVEYS:  FOTO 39/52  COGNITION: Overall cognitive status: Within functional limits for tasks assessed     SENSATION: Pt reports reduced sensation to light touch L L4-5 dermatomes  EDEMA:  No pitting edema noted on dorsum of foot   POSTURE: pt stands with wide base of support for balance; uses UE support on LE's for sit to stand transfer  PALPATION: (-) TTP distal LE b/l  LOWER EXTREMITY ROM:  Active ROM Right eval Left eval  Hip flexion WNL WNL  Hip extension    Hip abduction    Hip adduction    Hip internal rotation    Hip external rotation    Knee flexion 105 105  Knee extension -5 -5  Ankle dorsiflexion 5 deg PROM 0 deg  Ankle plantarflexion    Great toe extension 45 deg PROM 40 deg   Ankle eversion     (Blank rows = not tested)  LOWER EXTREMITY MMT:  MMT Right eval Left eval  Hip flexion 4 4  Hip extension    Hip abduction    Hip adduction    Hip internal rotation    Hip external rotation    Knee flexion 5 5  Knee extension 4 4  Ankle dorsiflexion 4/5 1/5  Ankle plantarflexion 3/5 3/5  Great toe extension 4/5 2/5  Ankle eversion 4/5 3/5   (Blank rows = not tested)   FUNCTIONAL TESTS:  OUTCOME MEASURES: TEST Outcome Interpretation  5 times sit<>stand 21 seconds >60 yo, >15 sec indicates increased risk for falls  10 meter walk test deferred <1.0 m/s indicates increased risk for falls; limited community ambulator  [employer] Assessment /56   deferred <36/56 (100% risk for falls), 37-45 (80% risk for falls); 46-51 (>50% risk for falls); 52-55 (lower risk <25% of falls)      GAIT: Distance walked: from waiting area into PT gym 50 ft, PT supervision Assistive device utilized: None Level of assistance: Complete Independence Comments: Pt ambulates with decreased gait speed, wide BOS, decreased stride length, excess L hip flexion strategy to compensate for L foot drop  Pt not able to stand SLS on L without  UE support today; able to stand R LE x 5 seconds  TODAY'S TREATMENT:  DATE: 12/10/22  Subjective: Patient reports he is overall doing well today.  He has different sneakers on today and feels like they are influencing his balance- feels a little harder to balance.  He got his cardiac rehab schedule mixed up and showed up there this morning.  He did ~ 1/2 mile walking on the treadmill before PT.  He is a little tired upon arrival.  Pain: 0/10  Objective: Pt arrived wearing his AFO and kept it on throughout session today HR: 58 BPM, SP02% 98% at start of session.  Therapeutic Exercises: While wearing L AFO, no SPC during session, gait belt and PT supervision/SBA during standing exercises  Sit to stand x 5, 2 sets from chair with airex cushion- no use of UE   Seated knee extension: 5# AW 2x10   Seated marches: 5# AW 2x10  Seated hip adduction ball squeeze 2 x 10 with 3 second hold  Seated hip abd with blue band 2x10 with 5 second holds  Step ups onto 6 inch step: with 2 finger support ea UE; x6 lead with L LE, x6 leading with R LE  Standing alternating toe taps to green cones: sets of 12 alternating- PT supervision with gait belt- not today  Alternating step ups on airex: x6 lead with R and x6 lead with L- in parallel bars  Vitals at end of session: HR 66 SP02% 98%  LE MMT today: Hip flexion: R 4/5, L 4+/5 Hip Abduction: R 4-/5, L 4/5 Hip Extension: R 4-/5 , L 4/5  Not today:  -Hurdles: forward direction in // bars (PT supervision with gait belt) 6 laps, focused on hip flexion strategy for foot clearance; attempted cue for "lift toes" but pt not able to actively perform active DF of ankle against gravity -Side step over hurdles in // bars: 6 laps -Airex: feet together, 3 trials x 30-40 seconds with removing hand support -Airex: tandem stance, 3 trials  each R in front/L in front x 20-30 seconds removing hand support (pt notes ankle/lower leg fatigue after this) Standing heel raises: 2x10 Seated toe raises (with PT assist on L) 2x15 to L ant tib fatigue Seated Eversion with towel slide seated on floor x20 to fatigue Seated isometric inversion with ball x20 to fatigue Alternating toe taps : 6 inch height, 2 x5 R LE, 2x5 L LE with finger tip support on railing Standing hip abduction 3# AW x 10 each LE- not today  PATIENT EDUCATION:  Education details: PT POC/goals, importance of attending cardiac rehab, activity pacing Person educated: Patient Education method: Explanation Education comprehension: verbalized understanding and needs further education  HOME EXERCISE PROGRAM: Access Code: [email or web address] URL: [email or web address] Date: 10/24/2022 Prepared by: Max Fickle  Exercises - Seated Heel Raise  - 1 x daily - 3 x weekly - 2 sets - 20 reps - Seated March  - 1 x daily - 3 x weekly - 2 sets - 10 reps - Seated Long Arc Quad  - 1 x daily - 3 x weekly - 2 sets - 10 reps  ASSESSMENT:  CLINICAL IMPRESSION:  Patient's proximal R>L LE mm weakness contributed to difficulty with step ups today; he relies heavily on UE support.  1 small loss of balance while amb over door jam in clinic, pt able to regain balance with stepping strategy.  PT supervision with gait belt throughout standing therapeutic exercises.  Overall, he seems to be adjusting to AFO and it does help improve his gait pattern and improve his safety  during amb as he does not have L foot drop gait pattern.  He has not been using SPC to arrive, depart, or amb during PT session now that he is using AFO more consistently.  Patient will continue to benefit from skilled PT intervention to address listed impairments to decrease fall risk and improve overall mobility.   OBJECTIVE IMPAIRMENTS: Abnormal gait, cardiopulmonary status limiting activity, decreased activity tolerance,  decreased balance, decreased mobility, difficulty walking, decreased ROM, and decreased strength.   ACTIVITY LIMITATIONS: standing, squatting, stairs, transfers, and locomotion level  PARTICIPATION LIMITATIONS: meal prep, cleaning, laundry, interpersonal relationship, shopping, and community activity  PERSONAL FACTORS: Age, Past/current experiences, Time since onset of injury/illness/exacerbation, and cardiovascular hx including recent MI with stent placement last week  are also affecting patient's functional outcome.   REHAB POTENTIAL: Good  CLINICAL DECISION MAKING: Stable/uncomplicated  EVALUATION COMPLEXITY: Low   GOALS: Goals reviewed with patient? Yes  SHORT TERM GOALS: Target date: 11/11/22 Pt will be able to perform a HEP for LE strengthening >3x/week Baseline: 11/27/22 pt has been instructed on a HEP Goal status: In progress   LONG TERM GOALS: Target date: 12/04/22  Improve FOTO to >51 indicating pt able to perform his daily activities without being limited by his balance Baseline: 39 Goal status: In Progress  2.  Pt will be able to amb with typical width BOS and b/l heel strike gait pattern using least restrictive AD and AFO on flat/incline/decline/grass surfaces x 10 min Baseline: not using any AD or AFO in clinic, pt wide BOS, no L heel strike due to foot drop; 11/27/22 pt is amb with SPC now, discussions continue regarding AFO/brace for improved safety Goal status: INITIAL  3.  Pt will be able to perform 5x STS in <15 sec indicating reduced risk for falling Baseline: 21 sec, 11/27/22 19 sec (with mild UE use) Goal status: INITIAL  PLAN:  PT FREQUENCY: 2x/week  PT DURATION: 4 weeks  PLANNED INTERVENTIONS: Therapeutic exercises, Therapeutic activity, Neuromuscular re-education, Balance training, Gait training, Patient/Family education, Self Care, Joint mobilization, and Orthotic/Fit training  PLAN FOR NEXT SESSION: continue to assess BP and vitals, continue low  intensity LE strength/balance x 1 more month to optimize outcome expected with skilled PT; assess response to AFO; obstacle course- uneven surfaces next session  Max Fickle, PT, DPT, OCS  Physical Therapist - [facility]   Devin [name], PT 12/10/2022, 12:14 PM

## 2022-12-11 ENCOUNTER — Encounter: Payer: Medicare HMO | Attending: Cardiovascular Disease | Admitting: *Deleted

## 2022-12-11 DIAGNOSIS — Z955 Presence of coronary angioplasty implant and graft: Secondary | ICD-10-CM | POA: Diagnosis present

## 2022-12-11 DIAGNOSIS — I214 Non-ST elevation (NSTEMI) myocardial infarction: Secondary | ICD-10-CM | POA: Diagnosis present

## 2022-12-11 DIAGNOSIS — I252 Old myocardial infarction: Secondary | ICD-10-CM | POA: Insufficient documentation

## 2022-12-11 DIAGNOSIS — Z48812 Encounter for surgical aftercare following surgery on the circulatory system: Secondary | ICD-10-CM | POA: Diagnosis present

## 2022-12-11 LAB — GLUCOSE, CAPILLARY: Glucose-Capillary: 110 mg/dL — ABNORMAL HIGH (ref 70–99)

## 2022-12-11 NOTE — Progress Notes (Signed)
Daily Session Note  Patient Details  Name: Kemper Heupel [facility]. MRN: [id number] Date of Birth: [birth year] Referring Provider:   Flowsheet Row Cardiac Rehab from 11/19/2022 in [facility] Card[facility]  Referring Provider Dr. Adrian Blackwater MD       Encounter Date: 12/11/2022  Check In:  Session Check In - 12/11/22 1100       Check-In   Supervising physician immediately available to respond to emergencies See telemetry face sheet for immediately available ER MD    Location [facility]-Cardiac & Pulmonary Rehab    Staff Present Rory Percy, MS, Exercise Physiologist;Lenola Lockner, RN, BSN, CCRP;Meredith Jewel Baize, RN BSN;Noah Tickle, BS, Exercise Physiologist;[employer], , Exercise Physiologist    Virtual Visit No    Medication changes reported     No    Fall or balance concerns reported    No    Warm-up and Cool-down Performed on first and last piece of equipment    Resistance Training Performed Yes    VAD Patient? No    PAD/SET Patient? No      Pain Assessment   Currently in Pain? No/denies                Social History   Tobacco Use  Smoking Status Former  Smokeless Tobacco Never  Tobacco Comments   Quit over 40 years ago    Goals Met:  Independence with exercise equipment Exercise tolerated well No report of concerns or symptoms today  Goals Unmet:  Not Applicable  Comments: Pt able to follow exercise prescription today without complaint.  Will continue to monitor for progression.    Dr. Bethann Punches is Medical Director for [facility] Cardiac Rehabilitation.  Dr. Vida Rigger is Medical Director for [facility] Pulmonary Rehabilitation.

## 2022-12-12 ENCOUNTER — Ambulatory Visit: Payer: Medicare HMO | Attending: Family Medicine

## 2022-12-12 ENCOUNTER — Encounter: Payer: Self-pay | Admitting: *Deleted

## 2022-12-12 DIAGNOSIS — I214 Non-ST elevation (NSTEMI) myocardial infarction: Secondary | ICD-10-CM

## 2022-12-12 DIAGNOSIS — R2689 Other abnormalities of gait and mobility: Secondary | ICD-10-CM | POA: Insufficient documentation

## 2022-12-12 DIAGNOSIS — M6281 Muscle weakness (generalized): Secondary | ICD-10-CM | POA: Diagnosis present

## 2022-12-12 DIAGNOSIS — Z955 Presence of coronary angioplasty implant and graft: Secondary | ICD-10-CM

## 2022-12-12 NOTE — Progress Notes (Signed)
Cardiac Individual Treatment Plan  Patient Details  Name: Devin [name] [facility]. MRN: [id number] Date of Birth: Feb 15, 1946 Referring Provider:   Flowsheet Row Cardiac Rehab from 11/19/2022 in [facility] Cardiac and Pulmonary Rehab  Referring Provider Dr. Adrian Blackwater MD       Initial Encounter Date:  Flowsheet Row Cardiac Rehab from 11/19/2022 in [facility] Cardiac and Pulmonary Rehab  Date 11/19/22       Visit Diagnosis: NSTEMI (non-ST elevation myocardial infarction) [facility])  Status post coronary artery stent placement  Patient's Home Medications on Admission:  Current Outpatient Medications:    albuterol (VENTOLIN HFA) 108 (90 Base) MCG/ACT inhaler, Inhale 2 puffs into the lungs every 6 (six) hours as needed for wheezing or shortness of breath., Disp: , Rfl:    amiodarone (PACERONE) 200 MG tablet, Take 200 mg by mouth daily., Disp: , Rfl:    amLODipine (NORVASC) 10 MG tablet, Take 1 tablet by mouth daily., Disp: , Rfl:    apixaban (ELIQUIS) 5 MG TABS tablet, Take 1 tablet (5 mg total) by mouth 2 (two) times daily., Disp: 60 tablet, Rfl: 3   atorvastatin (LIPITOR) 80 MG tablet, Take 80 mg by mouth daily., Disp: , Rfl:    buPROPion (WELLBUTRIN XL) 300 MG 24 hr tablet, Take 300 mg by mouth daily., Disp: , Rfl:    cephALEXin (KEFLEX) 500 MG capsule, Take 500 mg by mouth 3 (three) times daily., Disp: , Rfl:    clopidogrel (PLAVIX) 75 MG tablet, Take 1 tablet (75 mg total) by mouth daily with breakfast., Disp: 30 tablet, Rfl: 11   docusate (COLACE) 60 MG/15ML syrup, Take 60 mg by mouth daily., Disp: , Rfl:    EPINEPHrine 0.3 mg/0.3 mL IJ SOAJ injection, Inject 0.3 mg into the muscle as needed for anaphylaxis., Disp: , Rfl:    ezetimibe (ZETIA) 10 MG tablet, Take 10 mg by mouth daily., Disp: , Rfl:    fluticasone (FLONASE) 50 MCG/ACT nasal spray, SPRAY 2 SPRAYS INTO EACH NOSTRIL EVERY DAY, Disp: , Rfl:    gabapentin (NEURONTIN) 300 MG capsule, Take 300 mg by mouth 2 (two) times daily., Disp: , Rfl:     loratadine (CLARITIN) 10 MG tablet, Take 10 mg by mouth daily., Disp: , Rfl:    metFORMIN (GLUCOPHAGE) 500 MG tablet, Take 1,000 mg by mouth 2 (two) times daily. (Patient not taking: Reported on 12/07/2022), Disp: , Rfl:    metoprolol succinate (TOPROL XL) 25 MG 24 hr tablet, Take 1 tablet (25 mg total) by mouth daily., Disp: 30 tablet, Rfl: 11   nitroGLYCERIN (NITROSTAT) 0.4 MG SL tablet, Place 0.4 mg under the tongue every 5 (five) minutes as needed for chest pain. (Patient not taking: Reported on 12/07/2022), Disp: , Rfl:    omeprazole (PRILOSEC OTC) 20 MG tablet, Take 20 mg by mouth daily., Disp: , Rfl:    sacubitril-valsartan (ENTRESTO) 97-103 MG, Hold until followup with Dr. 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   Hypertension  Ischemic cardiomyopathy    MI, old    Peripheral neuropathy    PFO (patent foramen ovale) 05/2019   s/p repair   Sleep apnea    Spinal stenosis of lumbar region    TIA (transient ischemic attack)     Tobacco Use: Social History   Tobacco Use  Smoking Status Former  Smokeless Tobacco Never  Tobacco Comments   Quit over 40 years ago    Labs: Review Flowsheet  More data exists      Latest Ref Rng & Units 04/08/2020 07/04/2020 04/12/2022 04/13/2022 10/13/2022  Labs for ITP Cardiac and Pulmonary Rehab  Cholestrol 0 - 200 mg/dL - - - - 132   LDL (calc) 0 - 99 mg/dL - - - - 35   HDL-C >44 mg/dL - - - - 59   Trlycerides <150 mg/dL - - - - 55   Hemoglobin A1c 4.8 - 5.6 % 5.8  - - 5.5  6.3   Bicarbonate 20.0 - 28.0 mmol/L - - 23.9  -  -  TCO2 22 - 32 mmol/L - 24  - - -  Acid-base deficit 0.0 - 2.0 mmol/L - - 0.1  - -  O2 Saturation % - - 90.3  - -    Details             Exercise Target Goals: Exercise Program Goal: Individual exercise prescription set using results from initial 6 min walk test and THRR while considering  patient's activity barriers and safety.   Exercise Prescription Goal: Initial exercise prescription builds to 30-45 minutes a day of aerobic activity, 2-3 days per week.  Home exercise guidelines will be given to patient during program as part of exercise prescription that the participant will acknowledge.   Education: Aerobic Exercise: - Group verbal and visual presentation on the components of exercise prescription. Introduces F.I.T.T principle from ACSM for exercise prescriptions.  Reviews F.I.T.T. principles of aerobic exercise including progression. Written material given at graduation. Flowsheet Row Cardiac Rehab from 11/19/2022 in [facility] Cardiac and Pulmonary Rehab  Education need identified 11/19/22       Education: Resistance Exercise: - Group verbal and visual presentation on the components of exercise prescription. Introduces F.I.T.T principle from ACSM for exercise prescriptions  Reviews F.I.T.T. principles of resistance exercise including progression. Written material given at graduation.    Education: Exercise & Equipment Safety: - Individual verbal instruction and demonstration of equipment use and safety with use of the equipment. Flowsheet Row Cardiac Rehab from 11/19/2022 in [facility] Cardiac and Pulmonary Rehab  Date 11/19/22  Educator NT  Instruction Review Code 1- Verbalizes Understanding       Education: Exercise Physiology & General Exercise Guidelines: - Group verbal and written instruction with models to review the exercise physiology of the cardiovascular system and associated critical values. Provides general exercise guidelines with specific guidelines to those with  heart or lung disease.  Flowsheet Row Cardiac Rehab from 10/07/2019 in [facility] Cardiac and Pulmonary Rehab  Date 09/23/19  Educator AS  Instruction Review Code 1- Verbalizes Understanding       Education: Flexibility, Balance, Mind/Body Relaxation: - Group verbal and visual presentation with interactive activity on the components of exercise prescription. Introduces F.I.T.T principle from ACSM for exercise prescriptions. Reviews F.I.T.T. principles of flexibility and balance exercise training including progression. Also discusses the mind body connection.  Reviews various relaxation techniques to help reduce and manage stress (i.e. Deep breathing, progressive muscle relaxation, and visualization). Balance handout provided to take home. Written  material given at graduation.   Activity Barriers & Risk Stratification:  Activity Barriers & Cardiac Risk Stratification - 11/19/22 1442       Activity Barriers & Cardiac Risk Stratification   Activity Barriers Right Knee Replacement;Left Knee Replacement;Muscular Weakness;Balance Concerns;Other (comment)    Comments drop foot, impaired gait, neuropathy    Cardiac Risk Stratification Moderate             6 Minute Walk:  6 Minute Walk     Row Name 11/19/22 1440         6 Minute Walk   Phase Initial     Distance 620 feet     Walk Time 6 minutes     # of Rest Breaks 0     MPH 1.17     METS 1.02     RPE 11     Perceived Dyspnea  0     VO2 Peak 3.59     Symptoms No     Resting HR 56 bpm     Resting BP 128/68     Resting Oxygen Saturation  97 %     Exercise Oxygen Saturation  during 6 min walk 96 %     Max Ex. HR 68 bpm     Max Ex. BP 138/76     2 Minute Post BP 136/70              Oxygen Initial Assessment:   Oxygen Re-Evaluation:   Oxygen Discharge (Final Oxygen Re-Evaluation):   Initial Exercise Prescription:  Initial Exercise Prescription - 11/19/22 1400       Date of Initial Exercise RX and Referring Provider    Date 11/19/22    Referring Provider Dr. Adrian Blackwater MD      Oxygen   Maintain Oxygen Saturation 88% or higher      Treadmill   MPH 1    Grade 0    Minutes 15    METs 1.8      NuStep   Level 1    SPM 80    Minutes 15    METs 1.02      Arm Ergometer   Level 1    RPM 50    Minutes 15    METs 1.02      Track   Laps 16    Minutes 15    METs 1.87      Prescription Details   Frequency (times per week) 2    Duration Progress to 30 minutes of continuous aerobic without signs/symptoms of physical distress      Intensity   THRR 40-80% of Max Heartrate 90-124    Ratings of Perceived Exertion 11-13    Perceived Dyspnea 0-4      Progression   Progression Continue to progress workloads to maintain intensity without signs/symptoms of physical distress.      Resistance Training   Training Prescription Yes    Weight 4 lb    Reps 10-15             Perform Capillary Blood Glucose checks as needed.  Exercise Prescription Changes:   Exercise Prescription Changes     Row Name 11/19/22 1400             Response to Exercise   Blood Pressure (Admit) 128/68       Blood Pressure (Exercise) 138/76       Blood Pressure (Exit) 136/70       Heart Rate (Admit) 56 bpm  Heart Rate (Exercise) 68 bpm       Heart Rate (Exit) 60 bpm       Oxygen Saturation (Admit) 97 %       Oxygen Saturation (Exercise) 96 %       Rating of Perceived Exertion (Exercise) 11       Perceived Dyspnea (Exercise) 0       Symptoms none       Comments Results                Exercise Comments:   Exercise Comments     Row Name 11/27/22 0941           Exercise Comments First full day of exercise!  Patient was oriented to gym and equipment including functions, settings, policies, and procedures.  Patient's individual exercise prescription and treatment plan were reviewed.  All starting workloads were established based on the results of the 6 minute walk test done at initial  orientation visit.  The plan for exercise progression was also introduced and progression will be customized based on patient's performance and goals.  AAA parameters  set by his physician explained to patient.                Exercise Goals and Review:   Exercise Goals     Row Name 11/19/22 1442             Exercise Goals   Increase Physical Activity Yes       Intervention Provide advice, education, support and counseling about physical activity/exercise needs.;Develop an individualized exercise prescription for aerobic and resistive training based on initial evaluation findings, risk stratification, comorbidities and participant's personal goals.       Expected Outcomes Long Term: Add in home exercise to make exercise part of routine and to increase amount of physical activity.;Long Term: Exercising regularly at least 3-5 days a week.;Short Term: Attend rehab on a regular basis to increase amount of physical activity.       Increase Strength and Stamina Yes       Intervention Provide advice, education, support and counseling about physical activity/exercise needs.;Develop an individualized exercise prescription for aerobic and resistive training based on initial evaluation findings, risk stratification, comorbidities and participant's personal goals.       Expected Outcomes Short Term: Increase workloads from initial exercise prescription for resistance, speed, and METs.;Short Term: Perform resistance training exercises routinely during rehab and add in resistance training at home;Long Term: Improve cardiorespiratory fitness, muscular endurance and strength as measured by increased METs and functional capacity ( )       Able to understand and use rate of perceived exertion (RPE) scale Yes       Intervention Provide education and explanation on how to use RPE scale       Expected Outcomes Short Term: Able to use RPE daily in rehab to express subjective intensity level;Long Term:  Able  to use RPE to guide intensity level when exercising independently       Able to understand and use Dyspnea scale Yes       Intervention Provide education and explanation on how to use Dyspnea scale       Expected Outcomes Short Term: Able to use Dyspnea scale daily in rehab to express subjective sense of shortness of breath during exertion;Long Term: Able to use Dyspnea scale to guide intensity level when exercising independently       Knowledge and understanding of Target Heart Rate Range (THRR) Yes  Intervention Provide education and explanation of THRR including how the numbers were predicted and where they are located for reference       Expected Outcomes Short Term: Able to state/look up THRR;Short Term: Able to use daily as guideline for intensity in rehab;Long Term: Able to use THRR to govern intensity when exercising independently       Able to check pulse independently Yes       Intervention Provide education and demonstration on how to check pulse in carotid and radial arteries.;Review the importance of being able to check your own pulse for safety during independent exercise       Expected Outcomes Short Term: Able to explain why pulse checking is important during independent exercise;Long Term: Able to check pulse independently and accurately       Understanding of Exercise Prescription Yes       Intervention Provide education, explanation, and written materials on patient's individual exercise prescription       Expected Outcomes Short Term: Able to explain program exercise prescription;Long Term: Able to explain home exercise prescription to exercise independently                Exercise Goals Re-Evaluation :  Exercise Goals Re-Evaluation     Row Name 11/27/22 0941             Exercise Goal Re-Evaluation   Exercise Goals Review Able to understand and use rate of perceived exertion (RPE) scale;Knowledge and understanding of Target Heart Rate Range (THRR);Understanding  of Exercise Prescription;Able to understand and use Dyspnea scale       Comments Reviewed RPE  and dyspnea scale, THR and program prescription with pt today.  Pt voiced understanding and was given a copy of goals to take home.       Expected Outcomes Short: Use RPE daily to regulate intensity. Long: Follow program prescription in THR.                Discharge Exercise Prescription (Final Exercise Prescription Changes):  Exercise Prescription Changes - 11/19/22 1400       Response to Exercise   Blood Pressure (Admit) 128/68    Blood Pressure (Exercise) 138/76    Blood Pressure (Exit) 136/70    Heart Rate (Admit) 56 bpm    Heart Rate (Exercise) 68 bpm    Heart Rate (Exit) 60 bpm    Oxygen Saturation (Admit) 97 %    Oxygen Saturation (Exercise) 96 %    Rating of Perceived Exertion (Exercise) 11    Perceived Dyspnea (Exercise) 0    Symptoms none    Comments Results             Nutrition:  Target Goals: Understanding of nutrition guidelines, daily intake of sodium 1500mg , cholesterol 200mg , calories 30% from fat and 7% or less from saturated fats, daily to have 5 or more servings of fruits and vegetables.  Education: All About Nutrition: -Group instruction provided by verbal, written material, interactive activities, discussions, models, and posters to present general guidelines for heart healthy nutrition including fat, fiber, MyPlate, the role of sodium in heart healthy nutrition, utilization of the nutrition label, and utilization of this knowledge for meal planning. Follow up email sent as well. Written material given at graduation. Flowsheet Row Cardiac Rehab from 11/19/2022 in [facility] Cardiac and Pulmonary Rehab  Education need identified 11/19/22       Biometrics:  Pre Biometrics - 11/19/22 1443       Pre Biometrics  Height 5' 11.5" (1.816 m)    Weight 205 lb 3.2 oz (93.1 kg)    Waist Circumference 43 inches    Hip Circumference 42 inches    Waist to Hip  Ratio 1.02 %    BMI (Calculated) 28.22    Single Leg Stand 1.2 seconds              Nutrition Therapy Plan and Nutrition Goals:  Nutrition Therapy & Goals - 11/27/22 1131       Nutrition Therapy   Diet Carb controlled, Cardiac, low na    Protein (specify units) 90    Fiber 30 grams    Whole Grain Foods 3 servings    Saturated Fats 15 max. grams    Fruits and Vegetables 5 servings/day    Sodium 2 grams      Personal Nutrition Goals   Nutrition Goal Eat a protein at every meal and pair it with a carb    Personal Goal #2 Use protein snacks if needed    Personal Goal #3 Use sweets smartly, and make good snack choices    Comments patient drinking >64oz of water most days. Rarely drinks sugary drinks. He eats 3 meals per day. Has veteran in-home assistance to help him make meals. Likes to go out to restaurants for social interaction, but tries to make good food choices. Reviewed mediterranean diet handout. Educated on types of fats, sources, and how to read labels. He reports he doesn't use salt at home, commended him and educated about sodium limits, how processed foods can have lots in them before arriving to the table. Reviewed his 24hr food recall, encouraged more protein. Recommended greek yogurt, cottage cheese, hard boiled eggs, tuna, chicken, premier protein shakes as ways to boost protein intake. Build out several meals and snacks with foods he likes and will eat, focusing on adequate protein intake, healthy fats and colorful plates with controlled portions of carbs.      Intervention Plan   Intervention Prescribe, educate and counsel regarding individualized specific dietary modifications aiming towards targeted core components such as weight, hypertension, lipid management, diabetes, heart failure and other comorbidities.;Nutrition handout(s) given to patient.    Expected Outcomes Long Term Goal: Adherence to prescribed nutrition plan.;Short Term Goal: A plan has been developed  with personal nutrition goals set during dietitian appointment.;Short Term Goal: Understand basic principles of dietary content, such as calories, fat, sodium, cholesterol and nutrients.             Nutrition Assessments:  MEDIFICTS Score Key: >=70 Need to make dietary changes  40-70 Heart Healthy Diet <= 40 Therapeutic Level Cholesterol Diet  Flowsheet Row Cardiac Rehab from 11/19/2022 in [facility] Cardiac and Pulmonary Rehab  Picture Your Plate Total Score on Admission 72      Picture Your Plate Scores: <93 Unhealthy dietary pattern with much room for improvement. 41-50 Dietary pattern unlikely to meet recommendations for good health and room for improvement. 51-60 More healthful dietary pattern, with some room for improvement.  >60 Healthy dietary pattern, although there may be some specific behaviors that could be improved.    Nutrition Goals Re-Evaluation:   Nutrition Goals Discharge (Final Nutrition Goals Re-Evaluation):   Psychosocial: Target Goals: Acknowledge presence or absence of significant depression and/or stress, maximize coping skills, provide positive support system. Participant is able to verbalize types and ability to use techniques and skills needed for reducing stress and depression.   Education: Stress, Anxiety, and Depression - Group verbal and visual presentation  to define topics covered.  Reviews how body is impacted by stress, anxiety, and depression.  Also discusses healthy ways to reduce stress and to treat/manage anxiety and depression.  Written material given at graduation.   Education: Sleep Hygiene -Provides group verbal and written instruction about how sleep can affect your health.  Define sleep hygiene, discuss sleep cycles and impact of sleep habits. Review good sleep hygiene tips.    Initial Review & Psychosocial Screening:  Initial Psych Review & Screening - 11/07/22 1003       Initial Review   Current issues with None Identified       Family Dynamics   Good Support System? Yes   lady friend     Barriers   Psychosocial barriers to participate in program There are no identifiable barriers or psychosocial needs.      Screening Interventions   Interventions Encouraged to exercise;To provide support and resources with identified psychosocial needs;Provide feedback about the scores to participant    Expected Outcomes Short Term goal: Utilizing psychosocial counselor, staff and physician to assist with identification of specific Stressors or current issues interfering with healing process. Setting desired goal for each stressor or current issue identified.;Long Term Goal: Stressors or current issues are controlled or eliminated.;Short Term goal: Identification and review with participant of any Quality of Life or Depression concerns found by scoring the questionnaire.;Long Term goal: The participant improves quality of Life and PHQ9 Scores as seen by post scores and/or verbalization of changes             Quality of Life Scores:   Quality of Life - 11/19/22 1434       Quality of Life   Select Quality of Life      Quality of Life Scores   Health/Function Pre 22.37 %    Socioeconomic Pre 24.07 %    Psych/Spiritual Pre 22 %    Family Pre 30 %    GLOBAL Pre 23.2 %            Scores of 19 and below usually indicate a poorer quality of life in these areas.  A difference of  2-3 points is a clinically meaningful difference.  A difference of 2-3 points in the total score of the Quality of Life Index has been associated with significant improvement in overall quality of life, self-image, physical symptoms, and general health in studies assessing change in quality of life.  PHQ-9: Review Flowsheet  More data may exist      11/19/2022 01/12/2020 10/28/2019 09/09/2019 07/02/2019  Depression screen PHQ 2/9  Decreased Interest 1 3 2 1 2   Down, Depressed, Hopeless 1 0 0 0 1  PHQ - 2 Score 2 3 2 1 3   Altered sleeping 1 1 3 3 3    Tired, decreased energy 1 2 2  0 2  Change in appetite 1 1 1  0 1  Feeling bad or failure about yourself  0 0 0 0 0  Trouble concentrating 0 0 1 0 0  Moving slowly or fidgety/restless 0 0 1 0 0  Suicidal thoughts 0 1 0 0 0  PHQ-9 Score 5 8 10 4 9   Difficult doing work/chores Somewhat difficult Not difficult at all Somewhat difficult Not difficult at all Not difficult at all    Details           Interpretation of Total Score  Total Score Depression Severity:  1-4 = Minimal depression, 5-9 = Mild depression, 10-14 = Moderate depression, 15-19 =  Moderately severe depression, 20-27 = Severe depression   Psychosocial Evaluation and Intervention:  Psychosocial Evaluation - 11/07/22 1020       Psychosocial Evaluation & Interventions   Comments Ed has no barriers to attending the prgram. He has been in program before and wants to work on his leg strength and his balance. He is in PT at this time for balance concerns. He states no concerns with stress. He is working with his physician over his medication. He is ready to start    Expected Outcomes STGAttend all scheduled sessions, work on exercise progression while concentrating on leg strength and improved balance LTG Continues working on exercie progression after discharge    Continue Psychosocial Services  Follow up required by staff             Psychosocial Re-Evaluation:   Psychosocial Discharge (Final Psychosocial Re-Evaluation):   Vocational Rehabilitation: Provide vocational rehab assistance to qualifying candidates.   Vocational Rehab Evaluation & Intervention:  Vocational Rehab - 11/07/22 1019       Initial Vocational Rehab Evaluation & Intervention   Assessment shows need for Vocational Rehabilitation No      Vocational Rehab Re-Evaulation   Comments retired             Education: Education Goals: Education classes will be provided on a variety of topics geared toward better understanding of heart health  and risk factor modification. Participant will state understanding/return demonstration of topics presented as noted by education test scores.  Learning Barriers/Preferences:  Learning Barriers/Preferences - 11/07/22 1008       Learning Barriers/Preferences   Learning Barriers None    Learning Preferences None             General Cardiac Education Topics:  AED/CPR: - Group verbal and written instruction with the use of models to demonstrate the basic use of the AED with the basic ABC's of resuscitation.   Anatomy and Cardiac Procedures: - Group verbal and visual presentation and models provide information about basic cardiac anatomy and function. Reviews the testing methods done to diagnose heart disease and the outcomes of the test results. Describes the treatment choices: Medical Management, Angioplasty, or Coronary Bypass Surgery for treating various heart conditions including Myocardial Infarction, Angina, Valve Disease, and Cardiac Arrhythmias.  Written material given at graduation. Flowsheet Row Cardiac Rehab from 11/19/2022 in [facility] Cardiac and Pulmonary Rehab  Education need identified 11/19/22       Medication Safety: - Group verbal and visual instruction to review commonly prescribed medications for heart and lung disease. Reviews the medication, class of the drug, and side effects. Includes the steps to properly store meds and maintain the prescription regimen.  Written material given at graduation. Flowsheet Row Cardiac Rehab from 10/07/2019 in [facility] Cardiac and Pulmonary Rehab  Date 10/07/19  Educator SB  Instruction Review Code 1- Verbalizes Understanding       Intimacy: - Group verbal instruction through game format to discuss how heart and lung disease can affect sexual intimacy. Written material given at graduation..   Know Your Numbers and Heart Failure: - Group verbal and visual instruction to discuss disease risk factors for cardiac and pulmonary disease  and treatment options.  Reviews associated critical values for Overweight/Obesity, Hypertension, Cholesterol, and Diabetes.  Discusses basics of heart failure: signs/symptoms and treatments.  Introduces Heart Failure Zone chart for action plan for heart failure.  Written material given at graduation.   Infection Prevention: - Provides verbal and written material to individual with  discussion of infection control including proper hand washing and proper equipment cleaning during exercise session. Flowsheet Row Cardiac Rehab from 11/19/2022 in [facility] Cardiac and Pulmonary Rehab  Date 11/19/22  Educator NT  Instruction Review Code 1- Verbalizes Understanding       Falls Prevention: - Provides verbal and written material to individual with discussion of falls prevention and safety. Flowsheet Row Cardiac Rehab from 11/19/2022 in [facility] Cardiac and Pulmonary Rehab  Date 11/19/22  Educator NT  Instruction Review Code 1- Verbalizes Understanding       Other: -Provides group and verbal instruction on various topics (see comments)   Knowledge Questionnaire Score:  Knowledge Questionnaire Score - 11/19/22 1429       Knowledge Questionnaire Score   Pre Score 21/26             Core Components/Risk Factors/Patient Goals at Admission:  Personal Goals and Risk Factors at Admission - 11/07/22 1008       Core Components/Risk Factors/Patient Goals on Admission    Weight Management Yes;Weight Maintenance    Intervention Weight Management: Develop a combined nutrition and exercise program designed to reach desired caloric intake, while maintaining appropriate intake of nutrient and fiber, sodium and fats, and appropriate energy expenditure required for the weight goal.    Admit Weight 204 lb (92.5 kg)    Goal Weight: Long Term 204 lb (92.5 kg)    Expected Outcomes Short Term: Continue to assess and modify interventions until short term weight is achieved;Long Term: Adherence to nutrition and  physical activity/exercise program aimed toward attainment of established weight goal;Weight Maintenance: Understanding of the daily nutrition guidelines, which includes 25-35% calories from fat, 7% or less cal from saturated fats, less than 200mg  cholesterol, less than 1.5gm of sodium, & 5 or more servings of fruits and vegetables daily    Diabetes Yes    Intervention Provide education about signs/symptoms and action to take for hypo/hyperglycemia.;Provide education about proper nutrition, including hydration, and aerobic/resistive exercise prescription along with prescribed medications to achieve blood glucose in normal ranges: Fasting glucose 65-99 mg/dL    Expected Outcomes Short Term: Participant verbalizes understanding of the signs/symptoms and immediate care of hyper/hypoglycemia, proper foot care and importance of medication, aerobic/resistive exercise and nutrition plan for blood glucose control.;Long Term: Attainment of HbA1C < 7%.    Heart Failure Yes    Intervention Provide a combined exercise and nutrition program that is supplemented with education, support and counseling about heart failure. Directed toward relieving symptoms such as shortness of breath, decreased exercise tolerance, and extremity edema.    Expected Outcomes Improve functional capacity of life;Short term: Attendance in program 2-3 days a week with increased exercise capacity. Reported lower sodium intake. Reported increased fruit and vegetable intake. Reports medication compliance.;Short term: Daily weights obtained and reported for increase. Utilizing diuretic protocols set by physician.;Long term: Adoption of self-care skills and reduction of barriers for early signs and symptoms recognition and intervention leading to self-care maintenance.    Hypertension Yes    Intervention Provide education on lifestyle modifcations including regular physical activity/exercise, weight management, moderate sodium restriction and  increased consumption of fresh fruit, vegetables, and low fat dairy, alcohol moderation, and smoking cessation.;Monitor prescription use compliance.    Expected Outcomes Short Term: Continued assessment and intervention until BP is < 140/89mm HG in hypertensive participants. < 130/40mm HG in hypertensive participants with diabetes, heart failure or chronic kidney disease.;Long Term: Maintenance of blood pressure at goal levels.    Lipids Yes  Intervention Provide education and support for participant on nutrition & aerobic/resistive exercise along with prescribed medications to achieve LDL 70mg , HDL >40mg .    Expected Outcomes Short Term: Participant states understanding of desired cholesterol values and is compliant with medications prescribed. Participant is following exercise prescription and nutrition guidelines.;Long Term: Cholesterol controlled with medications as prescribed, with individualized exercise RX and with personalized nutrition plan. Value goals: LDL < 70mg , HDL > 40 mg.             Education:Diabetes - Individual verbal and written instruction to review signs/symptoms of diabetes, desired ranges of glucose level fasting, after meals and with exercise. Acknowledge that pre and post exercise glucose checks will be done for 3 sessions at entry of program. Flowsheet Row Cardiac Rehab from 01/13/2020 in [facility] Cardiac and Pulmonary Rehab  Date 01/13/20  Educator [facility]  Instruction Review Code 1- Verbalizes Understanding       Core Components/Risk Factors/Patient Goals Review:    Core Components/Risk Factors/Patient Goals at Discharge (Final Review):    ITP Comments:  ITP Comments     Row Name 11/07/22 1019 11/19/22 1426 11/27/22 0939 12/12/22 1229     ITP Comments Virtual orientation call completed today. he has an appointment on Date: [id number]  for EP eval and gym Orientation.  Documentation of diagnosis can be found in [facility]  Date: 10/13/2022  Letter sent for AAA  parameters. Completed and gym orientation. Initial ITP created and sent for review to Dr. Daniel Nones, Medical Director. First full day of exercise!  Patient was oriented to gym and equipment including functions, settings, policies, and procedures.  Patient's individual exercise prescription and treatment plan were reviewed.  All starting workloads were established based on the results of the 6 minute walk test done at initial orientation visit.  The plan for exercise progression was also introduced and progression will be customized based on patient's performance and goals. AAA parameters  set by his physician explained to patient. 30 Day review completed. Medical Director ITP review done, changes made as directed, and signed approval by Medical Director.    new to program             Comments:

## 2022-12-12 NOTE — Therapy (Signed)
OUTPATIENT PHYSICAL THERAPY LOWER EXTREMITY TREATMENT/Re-certification through 12/24/22   Patient Name: Devin [name] [facility]. MRN: [id number] DOB:11/22/1945, 77 y.o., male Today's Date: 12/12/2022  END OF SESSION:  PT End of Session - 12/12/22 1208     Visit Number 15    Number of Visits 18    Date for PT Re-Evaluation 12/24/22    Authorization Type 2x/week x 6 weeks (re-cert/PN done on 11/27/22 x 4 additional weeks)    PT Start Time 1205    PT Stop Time 1245    PT Time Calculation (min) 40 min    Activity Tolerance Patient tolerated treatment well    Behavior During Therapy WFL for tasks assessed/performed               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   Hypertension    Ischemic cardiomyopathy    MI, old    Peripheral neuropathy    PFO (patent foramen ovale) 05/2019   s/p repair   Sleep apnea    Spinal stenosis of lumbar region    TIA (transient ischemic attack)    Past Surgical History:  Procedure Laterality Date   APPENDECTOMY     BREAST SURGERY Right    benign mass   CARDIOVERSION Right 12/15/2019   Procedure: CARDIOVERSION;  Surgeon: Laurier Nancy, MD;  Location: [facility] ORS;  Service: Cardiovascular;  Laterality: Right;   CATARACT EXTRACTION, BILATERAL     COLONOSCOPY     CORONARY ANGIOPLASTY WITH STENT PLACEMENT     CORONARY ARTERY BYPASS GRAFT  05/2019   LIMA-LAD   CORONARY STENT INTERVENTION N/A 10/15/2022   Procedure: CORONARY STENT INTERVENTION;  Surgeon: Marcina Millard, MD;  Location: [facility] INVASIVE CV LAB;  Service: Cardiovascular;  Laterality: N/A;   LEFT HEART CATH N/A 10/15/2022    Procedure: Left Heart Cath;  Surgeon: Marcina Millard, MD;  Location: [facility] INVASIVE CV LAB;  Service: Cardiovascular;  Laterality: N/A;   LEFT HEART CATH AND CORS/GRAFTS ANGIOGRAPHY N/A 05/21/2019   Procedure: LEFT HEART CATH AND CORONARY ANGIOGRAPHY;  Surgeon: Laurier Nancy, MD;  Location: [facility] INVASIVE CV LAB;  Service: Cardiovascular;  Laterality: N/A;   LUMBAR LAMINECTOMY/DECOMPRESSION MICRODISCECTOMY N/A 07/04/2020   Procedure: L4-5 LAMINECTOMY;  Surgeon: Lucy Chris, MD;  Location: [facility] ORS;  Service: Neurosurgery;  Laterality: N/A;   REPLACEMENT TOTAL KNEE BILATERAL     Patient Active Problem List   Diagnosis Date Noted   NSTEMI (non-ST elevated myocardial infarction) (HCC) 10/13/2022   HFrEF (heart failure with reduced ejection fraction) (HCC) 10/13/2022   Degenerative joint disease (DJD) of lumbar spine 10/12/2022   Frequent falls 05/15/2022   Unsteady gait 04/16/2022   Acute delirium 04/13/2022   Electrolyte abnormality 04/13/2022   Nausea & vomiting 04/13/2022   PAD (peripheral artery disease) (HCC) 09/03/2021   Localized, primary osteoarthritis 11/24/2020   Wound healing, delayed 09/08/2020   History of lumbar surgery 09/08/2020   Chronic ischemic heart disease 07/13/2020   History of MI (myocardial infarction) 07/13/2020   History of PTCA 1 07/13/2020   Other personal history presenting  hazards to health 07/13/2020   Pure hypercholesterolemia 07/13/2020   Elevated lactic acid level 04/19/2020   Generalized weakness 04/09/2020   Fall at home 04/09/2020   Gastroenteritis 04/09/2020   AMS (altered mental status) 04/08/2020   Hypoxia 02/01/2020   Atrial fibrillation status post cardioversion (HCC) 12/22/2019   Congestive heart failure (CHF) (HCC) 12/13/2019   Hyponatremia 12/06/2019   Acute postoperative anemia due to expected blood loss 05/28/2019   Hyperlipidemia 05/28/2019   Ischemic cardiomyopathy 05/28/2019   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   Unstable angina (HCC) 05/24/2019   Chest pain 05/21/2019   Ischemic chest pain (HCC) 05/16/2019   CAD (coronary artery disease) 05/16/2019   HTN (hypertension) 05/16/2019   Depression 05/16/2019   GERD (gastroesophageal reflux disease) 05/16/2019   Verruca plantaris 01/23/2018   Obstructive sleep apnea syndrome 01/20/2018   Major depressive disorder, single episode, moderate (HCC) 07/05/2016   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 Zada Finders, MD  REFERRING PROVIDER: Dr. Zada Finders, MD/ [name], [facility]  REFERRING DIAG:  R26.9 (ICD-10-CM) - Unspecified abnormalities of gait and mobility  R26.89 (ICD-10-CM) - Other abnormalities of gait and mobility  R29.6 (ICD-10-CM) - Repeated falls    THERAPY DIAG:  Muscle weakness (generalized)  Balance problem  Other abnormalities of gait and mobility  Rationale for Evaluation and Treatment: Rehabilitation  ONSET DATE: at least 2 years  SUBJECTIVE: (From initial evaluation note)  SUBJECTIVE STATEMENT: Pt reports he is having difficulty with gait and balance.  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   He feels like his balance and walking difficulty have worsened recently.  He reports he started falling frequently, he was catching his foot on things and tripping.  The most recent major fall was about 6 months ago.  He is being careful since then and trying to walk more to get stronger and trying to be more aware of his gait pattern.  He was walking with a walker until ~4 months ago, then started using a cane, and then has tried walking without any assistance.       He feels like his difficulty walking limits his ability to get out of the house as much as he used to because he is fearful of falling.  PLOF- he enjoys getting out of the house for social activities, going out to breakfast too; going grocery shopping a few times/week to get his groceries; prior to his back surgery he walked at the park in [place], hasn't been back though because he is worried about falling though.  He has a home aide that comes a few days a week to assist with household tasks as needed.  Also reports he had a MI a little over a week ago and was in the hospital (8/3-10/16/22)- he states he is referred to begin cardiac rehab as part of his outpatient recovery.  PERTINENT HISTORY: Extensive cardiac history History of lumbar spine surgery- 2022 L4-5 microdiscectomy/laminectomy   PAIN:  Are you having pain? R groin muscle area where he had the cardiac catheterization procedure last week.     PRECAUTIONS: Fall  RED FLAGS: Pt with recent MI (  10/13/22) with stent placement (see chart review)  WEIGHT BEARING RESTRICTIONS: No  FALLS:  Has patient fallen in last 6 months? Yes. Number of falls multiple  LIVING ENVIRONMENT: Lives with: lives alone (has a self reported "lady friend" for social support and enjoys being able to get out the house for community activities, going to restaurants together) Lives in: House/apartment Stairs:  he lives in a single level home with bonus room over the garage, but he does not need to go upstairs.1 step to enter in the garage- able to get in without falling. Has following equipment at home: Single point cane, Walker - 4 wheeled, and Wheelchair (manual) Has a home health aide that comes 2-3x/week if he needs assistance  OCCUPATION: retired  PLOF: Independent  PATIENT GOALS: to address drop foot and be able to manage it better; to strengthen his legs; and to increase his breathing; and improve his balance   NEXT MD VISIT: yes, next week    OBJECTIVE:   DIAGNOSTIC FINDINGS: no recent lower extremity/spine imaging  PATIENT SURVEYS:  FOTO 39/52  COGNITION: Overall cognitive status: Within functional limits for tasks assessed     SENSATION: Pt reports reduced sensation to light touch L L4-5 dermatomes  EDEMA:  No pitting edema noted on dorsum of foot   POSTURE: pt stands with wide base of support for balance; uses UE support on LE's for sit to stand transfer  PALPATION: (-) TTP distal LE b/l  LOWER EXTREMITY ROM:  Active ROM Right eval Left eval  Hip flexion WNL WNL  Hip extension    Hip abduction    Hip adduction    Hip internal rotation    Hip external rotation    Knee flexion 105 105  Knee extension -5 -5  Ankle dorsiflexion 5 deg PROM 0 deg  Ankle plantarflexion    Great toe extension 45 deg PROM 40 deg   Ankle eversion     (Blank rows = not tested)  LOWER EXTREMITY MMT:  MMT Right eval Left eval  Hip flexion 4 4  Hip extension    Hip abduction    Hip adduction    Hip internal rotation    Hip external rotation    Knee flexion 5 5  Knee extension 4 4  Ankle dorsiflexion 4/5 1/5  Ankle plantarflexion 3/5 3/5  Great toe extension 4/5 2/5  Ankle eversion 4/5 3/5   (Blank rows = not tested)   FUNCTIONAL TESTS:  OUTCOME MEASURES: TEST Outcome Interpretation  5 times sit<>stand 21 seconds >60 yo, >15 sec indicates increased risk for falls  10 meter walk test deferred <1.0 m/s indicates increased risk for falls; limited community ambulator  [employer] Assessment /56   deferred <36/56 (100% risk for falls), 37-45 (80% risk for falls); 46-51 (>50% risk for falls); 52-55 (lower risk <25% of falls)      GAIT: Distance walked: from waiting area into PT gym 50 ft, PT supervision Assistive device utilized: None Level of assistance: Complete Independence Comments: Pt ambulates with decreased gait speed, wide BOS, decreased stride length, excess L hip flexion strategy to compensate for L  foot drop  Pt not able to stand SLS on L without UE support today; able to stand R LE x 5 seconds  TODAY'S TREATMENT:  DATE: 12/12/22  Subjective: Patient reports no falls or stumbles over the weekend.  He started drinking more water over the weekend too because he felt like he was dehydrated.  Increasing his water intake has helped increase his overall energy level.  Pain: 0/10  Objective: Pt arrived wearing his AFO and kept it on throughout session today HR: 58 BPM, SP02% 98% at start of session.  Therapeutic Exercises: While wearing L AFO, no SPC during session, gait belt and PT supervision/SBA during standing exercises  Sit to stand x 6, 2 sets from chair with airex cushion- no use of UE   Seated knee extension: 5# AW 2x10   Seated marches: 5# AW 2x10  Seated hip adduction ball squeeze 2 x 10 with 3 second hold  Seated hip abd with blue band 2x10 with 5 second holds  Step ups onto 6 inch step: with 2 finger support ea UE; x6 lead with L LE, x6 leading with R LE- not today  Standing alternating toe taps to green cones: sets of 12 alternating- PT supervision with gait belt- not today  Alternating step ups on airex: x6 lead with R and x6 lead with L- in parallel bars- not today  PT SBA with gait belt: High knee marches forward through blue ladder x 2 laps  High knee lateral marches through ladder x 2 laps  Diagonal ladder x 2 laps  Reverse walking 4x 10 m    Vitals at end of session: HR 66 SP02% 98%  LE MMT today (12/10/22): Hip flexion: R 4/5, L 4+/5 Hip Abduction: R 4-/5, L 4/5 Hip Extension: R 4-/5 , L 4/5  Not today:  -Hurdles: forward direction in // bars (PT supervision with gait belt) 6 laps, focused on hip flexion strategy for foot clearance; attempted cue for "lift toes" but pt not able to actively perform active DF of ankle against  gravity -Side step over hurdles in // bars: 6 laps -Airex: feet together, 3 trials x 30-40 seconds with removing hand support -Airex: tandem stance, 3 trials each R in front/L in front x 20-30 seconds removing hand support (pt notes ankle/lower leg fatigue after this) Standing heel raises: 2x10 Seated toe raises (with PT assist on L) 2x15 to L ant tib fatigue Seated Eversion with towel slide seated on floor x20 to fatigue Seated isometric inversion with ball x20 to fatigue Alternating toe taps : 6 inch height, 2 x5 R LE, 2x5 L LE with finger tip support on railing Standing hip abduction 3# AW x 10 each LE- not today  PATIENT EDUCATION:  Education details: PT POC/goals, importance of attending cardiac rehab, activity pacing Person educated: Patient Education method: Explanation Education comprehension: verbalized understanding and needs further education  HOME EXERCISE PROGRAM: Access Code: [email or web address] URL: [email or web address] Date: 10/24/2022 Prepared by: Max Fickle  Exercises - Seated Heel Raise  - 1 x daily - 3 x weekly - 2 sets - 20 reps - Seated March  - 1 x daily - 3 x weekly - 2 sets - 10 reps - Seated Long Arc Quad  - 1 x daily - 3 x weekly - 2 sets - 10 reps  ASSESSMENT:  CLINICAL IMPRESSION:  Patient was challenged with dynamic balance/gait activity through blue floor ladder today- had most difficulty amb in reverse direction; small steps noted and pt expresses fear.  No loss of balance noted, but he did amb very slowly and tentatively.  Would benefit from continuing to progress with dynamic balance/gait activities to  improve his safety and reduce fear of falling while still adjusting to his AFO.  Patient will continue to benefit from skilled PT intervention to address listed impairments to decrease fall risk and improve overall mobility.   OBJECTIVE IMPAIRMENTS: Abnormal gait, cardiopulmonary status limiting activity, decreased activity tolerance,  decreased balance, decreased mobility, difficulty walking, decreased ROM, and decreased strength.   ACTIVITY LIMITATIONS: standing, squatting, stairs, transfers, and locomotion level  PARTICIPATION LIMITATIONS: meal prep, cleaning, laundry, interpersonal relationship, shopping, and community activity  PERSONAL FACTORS: Age, Past/current experiences, Time since onset of injury/illness/exacerbation, and cardiovascular hx including recent MI with stent placement last week  are also affecting patient's functional outcome.   REHAB POTENTIAL: Good  CLINICAL DECISION MAKING: Stable/uncomplicated  EVALUATION COMPLEXITY: Low   GOALS: Goals reviewed with patient? Yes  SHORT TERM GOALS: Target date: 11/11/22 Pt will be able to perform a HEP for LE strengthening >3x/week Baseline: 11/27/22 pt has been instructed on a HEP Goal status: In progress   LONG TERM GOALS: Target date: 12/04/22  Improve FOTO to >51 indicating pt able to perform his daily activities without being limited by his balance Baseline: 39 Goal status: In Progress  2.  Pt will be able to amb with typical width BOS and b/l heel strike gait pattern using least restrictive AD and AFO on flat/incline/decline/grass surfaces x 10 min Baseline: not using any AD or AFO in clinic, pt wide BOS, no L heel strike due to foot drop; 11/27/22 pt is amb with SPC now, discussions continue regarding AFO/brace for improved safety Goal status: INITIAL  3.  Pt will be able to perform 5x STS in <15 sec indicating reduced risk for falling Baseline: 21 sec, 11/27/22 19 sec (with mild UE use) Goal status: INITIAL  PLAN:  PT FREQUENCY: 2x/week  PT DURATION: 4 weeks  PLANNED INTERVENTIONS: Therapeutic exercises, Therapeutic activity, Neuromuscular re-education, Balance training, Gait training, Patient/Family education, Self Care, Joint mobilization, and Orthotic/Fit training  PLAN FOR NEXT SESSION: proximal hip strengthening, multidirectional  movements/dynamic balance activities, LE strengthening, gait training with AFO; pt states he is planning to reach out to his orthotist to adjust his AFO  Max Fickle, PT, DPT, OCS  Physical Therapist - [facility]   Ardine Bjork, PT 12/12/2022, 12:09 PM

## 2022-12-13 ENCOUNTER — Encounter: Payer: Medicare HMO | Admitting: *Deleted

## 2022-12-13 DIAGNOSIS — I214 Non-ST elevation (NSTEMI) myocardial infarction: Secondary | ICD-10-CM

## 2022-12-13 DIAGNOSIS — Z955 Presence of coronary angioplasty implant and graft: Secondary | ICD-10-CM

## 2022-12-13 DIAGNOSIS — Z48812 Encounter for surgical aftercare following surgery on the circulatory system: Secondary | ICD-10-CM | POA: Diagnosis not present

## 2022-12-13 NOTE — Progress Notes (Signed)
Daily Session Note  Patient Details  Name: Devin [name] [facility]. MRN: [id number] Date of Birth: December 23, 1945 Referring Provider:   Flowsheet Row Cardiac Rehab from 11/19/2022 in [facility] Cardiac and Pulmonary Rehab  Referring Provider Dr. Adrian Blackwater MD       Encounter Date: 12/13/2022  Check In:  Session Check In - 12/13/22 1000       Check-In   Supervising physician immediately available to respond to emergencies See telemetry face sheet for immediately available ER MD    Location [facility]-Cardiac & Pulmonary Rehab    Staff Present Maxon Conetta BS, , Exercise Physiologist;Rosalene Wardrop Katrinka Blazing, RN, Silas Flood, BS, Exercise Physiologist    Virtual Visit No    Medication changes reported     No    Fall or balance concerns reported    No    Warm-up and Cool-down Performed on first and last piece of equipment    Resistance Training Performed Yes    VAD Patient? No    PAD/SET Patient? No      Pain Assessment   Currently in Pain? No/denies                Social History   Tobacco Use  Smoking Status Former  Smokeless Tobacco Never  Tobacco Comments   Quit over 40 years ago    Goals Met:  Independence with exercise equipment Exercise tolerated well No report of concerns or symptoms today Strength training completed today  Goals Unmet:  Not Applicable  Comments: Pt able to follow exercise prescription today without complaint.  Will continue to monitor for progression.    Dr. Bethann Punches is Medical Director for [facility] Cardiac Rehabilitation.  Dr. Vida Rigger is Medical Director for [facility] Pulmonary Rehabilitation.

## 2022-12-17 ENCOUNTER — Ambulatory Visit: Payer: Medicare HMO

## 2022-12-17 DIAGNOSIS — M6281 Muscle weakness (generalized): Secondary | ICD-10-CM

## 2022-12-17 DIAGNOSIS — R2689 Other abnormalities of gait and mobility: Secondary | ICD-10-CM

## 2022-12-17 NOTE — Therapy (Signed)
OUTPATIENT PHYSICAL THERAPY LOWER EXTREMITY TREATMENT/Re-certification through 12/24/22   Patient Name: Devin [name]. MRN: [id number] DOB:Mar 14, 1945, 77 y.o., male Today's Date: 12/17/2022  END OF SESSION:  PT End of Session - 12/17/22 1528     Visit Number 16    Number of Visits 18    Date for PT Re-Evaluation 12/24/22    Authorization Type 2x/week x 6 weeks (re-cert/PN done on 11/27/22 x 4 additional weeks)    PT Start Time 1205    PT Stop Time 1245    PT Time Calculation (min) 40 min    Equipment Utilized During Treatment Gait belt    Activity Tolerance Patient tolerated treatment well    Behavior During Therapy WFL for tasks assessed/performed                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   Hypertension    Ischemic cardiomyopathy    MI, old    Peripheral neuropathy    PFO (patent foramen ovale) 05/2019   s/p repair   Sleep apnea    Spinal stenosis of lumbar region    TIA (transient ischemic attack)    Past Surgical History:  Procedure Laterality Date   APPENDECTOMY     BREAST SURGERY Right    benign mass   CARDIOVERSION Right 12/15/2019   Procedure: CARDIOVERSION;  Surgeon: Laurier Nancy, MD;  Location: [facility] ORS;  Service: Cardiovascular;  Laterality: Right;   CATARACT EXTRACTION, BILATERAL     COLONOSCOPY     CORONARY ANGIOPLASTY WITH STENT PLACEMENT     CORONARY ARTERY BYPASS GRAFT  05/2019   LIMA-LAD   CORONARY STENT INTERVENTION N/A 10/15/2022   Procedure: CORONARY STENT INTERVENTION;  Surgeon: Marcina Millard, MD;  Location: [facility] INVASIVE CV LAB;  Service: Cardiovascular;   Laterality: N/A;   LEFT HEART CATH N/A 10/15/2022   Procedure: Left Heart Cath;  Surgeon: Marcina Millard, MD;  Location: [facility] INVASIVE CV LAB;  Service: Cardiovascular;  Laterality: N/A;   LEFT HEART CATH AND CORS/GRAFTS ANGIOGRAPHY N/A 05/21/2019   Procedure: LEFT HEART CATH AND CORONARY ANGIOGRAPHY;  Surgeon: Laurier Nancy, MD;  Location: [facility] INVASIVE CV LAB;  Service: Cardiovascular;  Laterality: N/A;   LUMBAR LAMINECTOMY/DECOMPRESSION MICRODISCECTOMY N/A 07/04/2020   Procedure: L4-5 LAMINECTOMY;  Surgeon: Lucy Chris, MD;  Location: [facility] ORS;  Service: Neurosurgery;  Laterality: N/A;   REPLACEMENT TOTAL KNEE BILATERAL     Patient Active Problem List   Diagnosis Date Noted   NSTEMI (non-ST elevated myocardial infarction) (HCC) 10/13/2022   HFrEF (heart failure with reduced ejection fraction) (HCC) 10/13/2022   Degenerative joint disease (DJD) of lumbar spine 10/12/2022   Frequent falls 05/15/2022   Unsteady gait 04/16/2022   Acute delirium 04/13/2022   Electrolyte abnormality 04/13/2022   Nausea & vomiting 04/13/2022   PAD (peripheral artery disease) (HCC) 09/03/2021   Localized, primary osteoarthritis 11/24/2020   Wound healing, delayed 09/08/2020   History of lumbar surgery 09/08/2020   Chronic ischemic heart disease 07/13/2020   History of MI (myocardial infarction) 07/13/2020   History  of PTCA 1 07/13/2020   Other personal history presenting hazards to health 07/13/2020   Pure hypercholesterolemia 07/13/2020   Elevated lactic acid level 04/19/2020   Generalized weakness 04/09/2020   Fall at home 04/09/2020   Gastroenteritis 04/09/2020   AMS (altered mental status) 04/08/2020   Hypoxia 02/01/2020   Atrial fibrillation status post cardioversion (HCC) 12/22/2019   Congestive heart failure (CHF) (HCC) 12/13/2019   Hyponatremia 12/06/2019   Acute postoperative anemia due to expected blood loss 05/28/2019   Hyperlipidemia 05/28/2019   Ischemic cardiomyopathy 05/28/2019    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   Unstable angina (HCC) 05/24/2019   Chest pain 05/21/2019   Ischemic chest pain (HCC) 05/16/2019   CAD (coronary artery disease) 05/16/2019   HTN (hypertension) 05/16/2019   Depression 05/16/2019   GERD (gastroesophageal reflux disease) 05/16/2019   Verruca plantaris 01/23/2018   Obstructive sleep apnea syndrome 01/20/2018   Major depressive disorder, single episode, moderate (HCC) 07/05/2016   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 Zada Finders, MD  REFERRING PROVIDER: Dr. Zada Finders, MD/ [name], [facility]  REFERRING DIAG:  R26.9 (ICD-10-CM) - Unspecified abnormalities of gait and mobility  R26.89 (ICD-10-CM) - Other abnormalities of gait and mobility  R29.6 (ICD-10-CM) - Repeated falls    THERAPY DIAG:  Muscle weakness (generalized)  Balance problem  Rationale for Evaluation and Treatment: Rehabilitation  ONSET DATE: at least 2 years  SUBJECTIVE: (From initial evaluation note)  SUBJECTIVE STATEMENT: Pt reports he is having difficulty with gait and balance.  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   He feels like his balance and walking difficulty have worsened recently.  He reports he started falling frequently, he was catching his foot on things and tripping.  The most recent major fall was about 6 months ago.  He is being careful since then and trying to walk more to get stronger and trying to be more aware of his gait pattern.  He was walking with a walker until ~4 months ago, then started using a cane, and then has tried walking without any assistance.       He feels like his difficulty walking limits his ability to get out of the house as much as he used to because he is fearful of falling.  PLOF- he enjoys getting out of the house for social activities, going out to breakfast too; going grocery shopping a few times/week to get his groceries; prior to his back surgery he walked at the park in [place], hasn't been back though because he is worried about falling though.  He has a home aide that comes a few days a week to assist with household tasks as needed.  Also reports he had a MI a little over a week ago and was in the hospital (8/3-10/16/22)- he states he is referred to begin cardiac rehab as part of his outpatient recovery.  PERTINENT HISTORY: Extensive cardiac history History of lumbar spine surgery- 2022 L4-5 microdiscectomy/laminectomy   PAIN:  Are you having pain? R groin muscle area where he had the cardiac catheterization procedure last week.     PRECAUTIONS: Fall  RED FLAGS: Pt  with recent MI (10/13/22) with stent placement (see chart review)  WEIGHT BEARING RESTRICTIONS: No  FALLS:  Has patient fallen in last 6 months? Yes. Number of falls multiple  LIVING ENVIRONMENT: Lives with: lives alone (has a self reported "lady friend" for social support and enjoys being able to get out the house for community activities, going to restaurants together) Lives in: House/apartment Stairs:  he lives in a single level home with bonus room over the garage, but he does not need to go upstairs.1 step to enter in the garage- able to get in without falling. Has following equipment at home: Single point cane, Walker - 4 wheeled, and Wheelchair (manual) Has a home health aide that comes 2-3x/week if he needs assistance  OCCUPATION: retired  PLOF: Independent  PATIENT GOALS: to address drop foot and be able to manage it better; to strengthen his legs; and to increase his breathing; and improve his balance   NEXT MD VISIT: yes, next week    OBJECTIVE:   DIAGNOSTIC FINDINGS: no recent lower extremity/spine imaging  PATIENT SURVEYS:  FOTO 39/52  COGNITION: Overall cognitive status: Within functional limits for tasks assessed     SENSATION: Pt reports reduced sensation to light touch L L4-5 dermatomes  EDEMA:  No pitting edema noted on dorsum of foot   POSTURE: pt stands with wide base of support for balance; uses UE support on LE's for sit to stand transfer  PALPATION: (-) TTP distal LE b/l  LOWER EXTREMITY ROM:  Active ROM Right eval Left eval  Hip flexion WNL WNL  Hip extension    Hip abduction    Hip adduction    Hip internal rotation    Hip external rotation    Knee flexion 105 105  Knee extension -5 -5  Ankle dorsiflexion 5 deg PROM 0 deg  Ankle plantarflexion    Great toe extension 45 deg PROM 40 deg   Ankle eversion     (Blank rows = not tested)  LOWER EXTREMITY MMT:  MMT Right eval Left eval  Hip flexion 4 4  Hip extension    Hip abduction    Hip adduction    Hip internal rotation    Hip external rotation    Knee flexion 5 5  Knee extension 4 4  Ankle dorsiflexion 4/5 1/5  Ankle plantarflexion 3/5 3/5  Great toe extension 4/5 2/5  Ankle eversion 4/5 3/5   (Blank rows = not tested)   FUNCTIONAL TESTS:  OUTCOME MEASURES: TEST Outcome Interpretation  5 times sit<>stand 21 seconds >60 yo, >15 sec indicates increased risk for falls  10 meter walk test deferred <1.0 m/s indicates increased risk for falls; limited community ambulator  [employer] Assessment /56   deferred <36/56 (100% risk for falls), 37-45 (80% risk for falls); 46-51 (>50% risk for falls); 52-55 (lower risk <25% of falls)      GAIT: Distance walked: from waiting area into PT gym 50 ft, PT supervision Assistive device utilized: None Level of assistance: Complete Independence Comments: Pt ambulates with decreased gait speed, wide BOS, decreased stride length, excess L hip flexion strategy to compensate for L  foot drop  Pt not able to stand SLS on L without UE support today; able to stand R LE x 5 seconds  TODAY'S TREATMENT:  DATE: 12/17/22  Subjective: Patient reports no falls or stumbles over the weekend.  He started drinking more water over the weekend too because he felt like he was dehydrated.  Increasing his water intake has helped increase his overall energy level.  Pt is walking a lot more; he is using his cane and his AFO.  He was able to stand/walk longer since starting PT- in a store/doing errands.  Able to go to a movie theater yesterday.   Pain: 0/10  Objective: Pt arrived wearing his AFO and kept it on throughout session today HR: 58 BPM, SP02% 98% at start of session.  Therapeutic Exercises: While wearing L AFO, no SPC during session, gait belt and PT supervision/SBA during standing exercises  Sit to stand x 6, 2 sets from chair with airex cushion- no use of UE   Seated knee extension: 5# AW 2x20   Seated marches: 5# AW 2x200  Seated hip adduction ball squeeze 2 x 10 with 3 second hold  Seated hip abd with blue band 2x10 with 5 second holds  Step ups onto 6 inch step: with 2 finger support ea UE; x6 lead with L LE, x6 leading with R LE- not today  Standing alternating toe taps to green cones: sets of 12 alternating- PT supervision with gait belt- not today  Alternating step ups on airex: x6 lead with R and x6 lead with L- in parallel bars- not today  PT SBA with gait belt: High knee marches forward through blue ladder x 2 laps  High knee lateral marches through ladder x 2 laps  Diagonal ladder x 2 laps  Reverse walking 4x 10 m    Vitals at end of session: HR 66 SP02% 98%  LE MMT today (12/10/22): Hip flexion: R 4/5, L 4+/5 Hip Abduction: R 4-/5, L 4/5 Hip Extension: R 4-/5 , L 4/5  Not today:  -Hurdles: forward direction in //  bars (PT supervision with gait belt) 6 laps, focused on hip flexion strategy for foot clearance; attempted cue for "lift toes" but pt not able to actively perform active DF of ankle against gravity -Side step over hurdles in // bars: 6 laps -Airex: feet together, 3 trials x 30-40 seconds with removing hand support -Airex: tandem stance, 3 trials each R in front/L in front x 20-30 seconds removing hand support (pt notes ankle/lower leg fatigue after this) Standing heel raises: 2x10 Seated toe raises (with PT assist on L) 2x15 to L ant tib fatigue Seated Eversion with towel slide seated on floor x20 to fatigue Seated isometric inversion with ball x20 to fatigue Alternating toe taps : 6 inch height, 2 x5 R LE, 2x5 L LE with finger tip support on railing Standing hip abduction 3# AW x 10 each LE- not today  PATIENT EDUCATION:  Education details: PT POC/goals, importance of attending cardiac rehab, activity pacing Person educated: Patient Education method: Explanation Education comprehension: verbalized understanding and needs further education  HOME EXERCISE PROGRAM: Access Code: [email or web address] URL: [email or web address] Date: 10/24/2022 Prepared by: Max Fickle  Exercises - Seated Heel Raise  - 1 x daily - 3 x weekly - 2 sets - 20 reps - Seated March  - 1 x daily - 3 x weekly - 2 sets - 10 reps - Seated Long Arc Quad  - 1 x daily - 3 x weekly - 2 sets - 10 reps  ASSESSMENT:  CLINICAL IMPRESSION:  Patient was challenged with dynamic balance/gait activity through blue floor ladder today- had  most difficulty amb in reverse direction; small steps noted and pt expresses fear.  No loss of balance noted, but he did amb very slowly and tentatively.  Would benefit from continuing to progress with dynamic balance/gait activities to improve his safety and reduce fear of falling while still adjusting to his AFO.  Patient will continue to benefit from skilled PT intervention to address  listed impairments to decrease fall risk and improve overall mobility.   OBJECTIVE IMPAIRMENTS: Abnormal gait, cardiopulmonary status limiting activity, decreased activity tolerance, decreased balance, decreased mobility, difficulty walking, decreased ROM, and decreased strength.   ACTIVITY LIMITATIONS: standing, squatting, stairs, transfers, and locomotion level  PARTICIPATION LIMITATIONS: meal prep, cleaning, laundry, interpersonal relationship, shopping, and community activity  PERSONAL FACTORS: Age, Past/current experiences, Time since onset of injury/illness/exacerbation, and cardiovascular hx including recent MI with stent placement last week  are also affecting patient's functional outcome.   REHAB POTENTIAL: Good  CLINICAL DECISION MAKING: Stable/uncomplicated  EVALUATION COMPLEXITY: Low   GOALS: Goals reviewed with patient? Yes  SHORT TERM GOALS: Target date: 11/11/22 Pt will be able to perform a HEP for LE strengthening >3x/week Baseline: 11/27/22 pt has been instructed on a HEP Goal status: In progress   LONG TERM GOALS: Target date: 12/04/22  Improve FOTO to >51 indicating pt able to perform his daily activities without being limited by his balance Baseline: 39 Goal status: In Progress  2.  Pt will be able to amb with typical width BOS and b/l heel strike gait pattern using least restrictive AD and AFO on flat/incline/decline/grass surfaces x 10 min Baseline: not using any AD or AFO in clinic, pt wide BOS, no L heel strike due to foot drop; 11/27/22 pt is amb with SPC now, discussions continue regarding AFO/brace for improved safety Goal status: INITIAL  3.  Pt will be able to perform 5x STS in <15 sec indicating reduced risk for falling Baseline: 21 sec, 11/27/22 19 sec (with mild UE use) Goal status: INITIAL  PLAN:  PT FREQUENCY: 2x/week  PT DURATION: 4 weeks  PLANNED INTERVENTIONS: Therapeutic exercises, Therapeutic activity, Neuromuscular re-education, Balance  training, Gait training, Patient/Family education, Self Care, Joint mobilization, and Orthotic/Fit training  PLAN FOR NEXT SESSION: proximal hip strengthening, multidirectional movements/dynamic balance activities, LE strengthening, gait training with AFO; pt states he is planning to reach out to his orthotist to adjust his AFO  Max Fickle, PT, DPT, OCS  Physical Therapist - [name]   Devin [name], PT 12/17/2022, 3:28 PM

## 2022-12-18 ENCOUNTER — Encounter: Payer: Medicare HMO | Admitting: *Deleted

## 2022-12-18 DIAGNOSIS — I214 Non-ST elevation (NSTEMI) myocardial infarction: Secondary | ICD-10-CM

## 2022-12-18 DIAGNOSIS — Z48812 Encounter for surgical aftercare following surgery on the circulatory system: Secondary | ICD-10-CM | POA: Diagnosis not present

## 2022-12-18 NOTE — Progress Notes (Signed)
Daily Session Note  Patient Details  Name: Devin [name] [facility]. MRN: [id number] Date of Birth: Jul 04, 1945 Referring Provider:   Flowsheet Row Cardiac Rehab from 11/19/2022 in [facility] Cardiac and Pulmonary Rehab  Referring Provider Dr. Adrian Blackwater MD       Encounter Date: 12/18/2022  Check In:  Session Check In - 12/18/22 1013       Check-In   Supervising physician immediately available to respond to emergencies See telemetry face sheet for immediately available ER MD    Location [facility]-Cardiac & Pulmonary Rehab    Staff Present Cora Collum, RN, BSN, CCRP;Noah Tickle, BS, Exercise Physiologist;Margaret Best, MS, Exercise Physiologist;Maxon Conetta BS, , Exercise Physiologist    Virtual Visit No    Medication changes reported     No    Fall or balance concerns reported    No    Warm-up and Cool-down Performed on first and last piece of equipment    Resistance Training Performed Yes    VAD Patient? No    PAD/SET Patient? No      Pain Assessment   Currently in Pain? No/denies                Social History   Tobacco Use  Smoking Status Former  Smokeless Tobacco Never  Tobacco Comments   Quit over 40 years ago    Goals Met:  Independence with exercise equipment Exercise tolerated well No report of concerns or symptoms today  Goals Unmet:  Not Applicable  Comments: Pt able to follow exercise prescription today without complaint.  Will continue to monitor for progression.    Dr. Bethann Punches is Medical Director for [facility] Cardiac Rehabilitation.  Dr. Vida Rigger is Medical Director for [facility] Pulmonary Rehabilitation.

## 2022-12-19 ENCOUNTER — Ambulatory Visit: Payer: Medicare HMO

## 2022-12-19 DIAGNOSIS — M6281 Muscle weakness (generalized): Secondary | ICD-10-CM | POA: Diagnosis not present

## 2022-12-19 DIAGNOSIS — R2689 Other abnormalities of gait and mobility: Secondary | ICD-10-CM

## 2022-12-20 ENCOUNTER — Encounter: Payer: Medicare HMO | Admitting: *Deleted

## 2022-12-20 DIAGNOSIS — Z955 Presence of coronary angioplasty implant and graft: Secondary | ICD-10-CM

## 2022-12-20 DIAGNOSIS — Z48812 Encounter for surgical aftercare following surgery on the circulatory system: Secondary | ICD-10-CM | POA: Diagnosis not present

## 2022-12-20 DIAGNOSIS — I214 Non-ST elevation (NSTEMI) myocardial infarction: Secondary | ICD-10-CM

## 2022-12-20 NOTE — Progress Notes (Signed)
Daily Session Note  Patient Details  Name: Devin [name] [facility]. MRN: [id number] Date of Birth: 12/07/1945 Referring Provider:   Flowsheet Row Cardiac Rehab from 11/19/2022 in [facility] Cardiac and Pulmonary Rehab  Referring Provider Dr. Adrian Blackwater MD       Encounter Date: 12/20/2022  Check In:  Session Check In - 12/20/22 0931       Check-In   Supervising physician immediately available to respond to emergencies See telemetry face sheet for immediately available ER MD    Location [facility]-Cardiac & Pulmonary Rehab    Staff Present Ronette Deter, BS, Exercise Physiologist;Joseph Chauncey, RCP,RRT,BSRT;Maxon [place] BS, , Exercise Physiologist;Remigio Mcmillon Katrinka Blazing, RN, ADN    Virtual Visit No    Medication changes reported     No    Fall or balance concerns reported    No    Warm-up and Cool-down Performed on first and last piece of equipment    Resistance Training Performed Yes    VAD Patient? No    PAD/SET Patient? No      Pain Assessment   Currently in Pain? No/denies                Social History   Tobacco Use  Smoking Status Former  Smokeless Tobacco Never  Tobacco Comments   Quit over 40 years ago    Goals Met:  Independence with exercise equipment Exercise tolerated well No report of concerns or symptoms today Strength training completed today  Goals Unmet:  Not Applicable  Comments: Pt able to follow exercise prescription today without complaint.  Will continue to monitor for progression.    Dr. Bethann Punches is Medical Director for [facility] Cardiac Rehabilitation.  Dr. Vida Rigger is Medical Director for [facility] Pulmonary Rehabilitation.

## 2022-12-21 NOTE — Therapy (Signed)
OUTPATIENT PHYSICAL THERAPY LOWER EXTREMITY TREATMENT/Re-certification through 12/24/22   Patient Name: Devin [name]. MRN: [id number] DOB:[birth year], 77 y.o., male Today's Date: 12/21/2022  END OF SESSION:  PT End of Session - 12/21/22 1055     Visit Number 17    Number of Visits 18    Date for PT Re-Evaluation 12/24/22    Authorization Type 2x/week x 6 weeks (re-cert/PN done on 11/27/22 x 4 additional weeks)    Equipment Utilized During Treatment Gait belt    Activity Tolerance Patient tolerated treatment well    Behavior During Therapy WFL for tasks assessed/performed                 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   Hypertension    Ischemic cardiomyopathy    MI, old    Peripheral neuropathy    PFO (patent foramen ovale) 05/2019   s/p repair   Sleep apnea    Spinal stenosis of lumbar region    TIA (transient ischemic attack)    Past Surgical History:  Procedure Laterality Date   APPENDECTOMY     BREAST SURGERY Right    benign mass   CARDIOVERSION Right 12/15/2019   Procedure: CARDIOVERSION;  Surgeon: Laurier Nancy, MD;  Location: [facility];  Service: Cardiovascular;  Laterality: Right;   CATARACT EXTRACTION, BILATERAL     COLONOSCOPY     CORONARY ANGIOPLASTY WITH STENT PLACEMENT     CORONARY ARTERY BYPASS GRAFT  05/2019   LIMA-LAD   CORONARY STENT INTERVENTION N/A 10/15/2022   Procedure: CORONARY STENT INTERVENTION;  Surgeon: Marcina Millard, MD;  Location: [facility];  Service: Cardiovascular;  Laterality: N/A;   LEFT HEART CATH N/A 10/15/2022   Procedure: Left Heart Cath;   Surgeon: Marcina Millard, MD;  Location: [facility];  Service: Cardiovascular;  Laterality: N/A;   LEFT HEART CATH AND CORS/GRAFTS ANGIOGRAPHY N/A 05/21/2019   Procedure: LEFT HEART CATH AND CORONARY ANGIOGRAPHY;  Surgeon: Laurier Nancy, MD;  Location: [facility];  Service: Cardiovascular;  Laterality: N/A;   LUMBAR LAMINECTOMY/DECOMPRESSION MICRODISCECTOMY N/A 07/04/2020   Procedure: L4-5 LAMINECTOMY;  Surgeon: Lucy Chris, MD;  Location: [facility];  Service: Neurosurgery;  Laterality: N/A;   REPLACEMENT TOTAL KNEE BILATERAL     Patient Active Problem List   Diagnosis Date Noted   NSTEMI (non-ST elevated myocardial infarction) (HCC) 10/13/2022   HFrEF (heart failure with reduced ejection fraction) (HCC) 10/13/2022   Degenerative joint disease (DJD) of lumbar spine 10/12/2022   Frequent falls 05/15/2022   Unsteady gait 04/16/2022   Acute delirium 04/13/2022   Electrolyte abnormality 04/13/2022   Nausea & vomiting 04/13/2022   PAD (peripheral artery disease) (HCC) 09/03/2021   Localized, primary osteoarthritis 11/24/2020   Wound healing, delayed 09/08/2020   History of lumbar surgery 09/08/2020   Chronic ischemic heart disease 07/13/2020   History of MI (myocardial infarction) 07/13/2020   History of PTCA 1 07/13/2020   Other personal history presenting hazards to health 07/13/2020   Pure hypercholesterolemia 07/13/2020   Elevated  lactic acid level 04/19/2020   Generalized weakness 04/09/2020   Fall at home 04/09/2020   Gastroenteritis 04/09/2020   AMS (altered mental status) 04/08/2020   Hypoxia 02/01/2020   Atrial fibrillation status post cardioversion (HCC) 12/22/2019   Congestive heart failure (CHF) (HCC) 12/13/2019   Hyponatremia 12/06/2019   Acute postoperative anemia due to expected blood loss 05/28/2019   Hyperlipidemia 05/28/2019   Ischemic cardiomyopathy 05/28/2019   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   Unstable angina (HCC)  05/24/2019   Chest pain 05/21/2019   Ischemic chest pain (HCC) 05/16/2019   CAD (coronary artery disease) 05/16/2019   HTN (hypertension) 05/16/2019   Depression 05/16/2019   GERD (gastroesophageal reflux disease) 05/16/2019   Verruca plantaris 01/23/2018   Obstructive sleep apnea syndrome 01/20/2018   Major depressive disorder, single episode, moderate (HCC) 07/05/2016   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 Zada Finders, MD  REFERRING PROVIDER: Dr. Zada Finders, MD/ [name], [facility]  REFERRING DIAG:  R26.9 (ICD-10-CM) - Unspecified abnormalities of gait and mobility  R26.89 (ICD-10-CM) - Other abnormalities of gait and mobility  R29.6 (ICD-10-CM) - Repeated falls    THERAPY DIAG:  Muscle weakness (generalized)  Balance problem  Rationale for Evaluation and Treatment: Rehabilitation  ONSET DATE: at least 2 years  SUBJECTIVE: (From initial evaluation note)  SUBJECTIVE STATEMENT: Pt reports he is having difficulty with gait and balance.  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   He feels like his balance and walking difficulty have worsened recently.  He reports he started falling frequently, he was catching his foot on things and tripping.  The most recent major fall was about 6 months ago.  He is being careful since then and trying to walk more to get stronger and trying to be more aware of his gait pattern.  He was walking with a walker until ~4 months ago, then started using a cane, and then has tried walking without any assistance.      He feels like his difficulty walking limits his ability to get out of the  house as much as he used to because he is fearful of falling.  PLOF- he enjoys getting out of the house for social activities, going out to breakfast too; going grocery shopping a few times/week to get his groceries; prior to his back surgery he walked at the park in [place], hasn't been back though because he is worried about falling though.  He has a home aide that comes a few days a week to assist with household tasks as needed.  Also reports he had a MI a little over a week ago and was in the hospital (8/3-10/16/22)- he states he is referred to begin cardiac rehab as part of his outpatient recovery.  PERTINENT HISTORY: Extensive cardiac history History of lumbar spine surgery- 2022 L4-5 microdiscectomy/laminectomy   PAIN:  Are you having pain? R groin muscle area where he had the cardiac catheterization procedure last week.     PRECAUTIONS: Fall  RED FLAGS: Pt with recent MI (10/13/22) with stent placement (see chart review)  WEIGHT BEARING RESTRICTIONS: No  FALLS:  Has patient fallen in  last 6 months? Yes. Number of falls multiple  LIVING ENVIRONMENT: Lives with: lives alone (has a self reported "lady friend" for social support and enjoys being able to get out the house for community activities, going to restaurants together) Lives in: House/apartment Stairs:  he lives in a single level home with bonus room over the garage, but he does not need to go upstairs.1 step to enter in the garage- able to get in without falling. Has following equipment at home: Single point cane, Walker - 4 wheeled, and Wheelchair (manual) Has a home health aide that comes 2-3x/week if he needs assistance  OCCUPATION: retired  PLOF: Independent  PATIENT GOALS: to address drop foot and be able to manage it better; to strengthen his legs; and to increase his breathing; and improve his balance   NEXT MD VISIT: yes, next week   OBJECTIVE:   DIAGNOSTIC FINDINGS: no recent lower extremity/spine  imaging  PATIENT SURVEYS:  FOTO 39/52  COGNITION: Overall cognitive status: Within functional limits for tasks assessed     SENSATION: Pt reports reduced sensation to light touch L L4-5 dermatomes  EDEMA:  No pitting edema noted on dorsum of foot   POSTURE: pt stands with wide base of support for balance; uses UE support on LE's for sit to stand transfer  PALPATION: (-) TTP distal LE b/l  LOWER EXTREMITY ROM:  Active ROM Right eval Left eval  Hip flexion WNL WNL  Hip extension    Hip abduction    Hip adduction    Hip internal rotation    Hip external rotation    Knee flexion 105 105  Knee extension -5 -5  Ankle dorsiflexion 5 deg PROM 0 deg  Ankle plantarflexion    Great toe extension 45 deg PROM 40 deg   Ankle eversion     (Blank rows = not tested)  LOWER EXTREMITY MMT:  MMT Right eval Left eval  Hip flexion 4 4  Hip extension    Hip abduction    Hip adduction    Hip internal rotation    Hip external rotation    Knee flexion 5 5  Knee extension 4 4  Ankle dorsiflexion 4/5 1/5  Ankle plantarflexion 3/5 3/5  Great toe extension 4/5 2/5  Ankle eversion 4/5 3/5   (Blank rows = not tested)   FUNCTIONAL TESTS:  OUTCOME MEASURES: TEST Outcome Interpretation  5 times sit<>stand 21 seconds >60 yo, >15 sec indicates increased risk for falls  10 meter walk test deferred <1.0 m/s indicates increased risk for falls; limited community ambulator  [employer] Assessment /56   deferred <36/56 (100% risk for falls), 37-45 (80% risk for falls); 46-51 (>50% risk for falls); 52-55 (lower risk <25% of falls)      GAIT: Distance walked: from waiting area into PT gym 50 ft, PT supervision Assistive device utilized: None Level of assistance: Complete Independence Comments: Pt ambulates with decreased gait speed, wide BOS, decreased stride length, excess L hip flexion strategy to compensate for L foot drop  Pt not able to stand SLS on L without UE support today;  able to stand R LE x 5 seconds  TODAY'S TREATMENT:  DATE: 12/19/22 Subjective:  Pt reports the cane and the AFO are helping him walk a little better.  Pain: 0/10  Objective: Pt arrived wearing his AFO and kept it on throughout session today HR: 58 BPM, SP02% 98% at start of session.  Therapeutic Exercises: While wearing L AFO, no SPC during session, gait belt and PT supervision/SBA during standing exercises  Training for amb on uneven outdoor surfaces- incline/decline sidewalk; grass forward/reverse/lateral, curb situation navigation with [facility]- practice stepping up/down curb using SPC in R hand for scenarios where there may not be a railing; stepped down with L LE and pt prefers stepping up with L LE as SPC is in R hand. (10 min)  Sit to stand x 6, 2 sets from chair with airex cushion- no use of UE   Seated knee extension: 5# AW 2x20   Seated marches: 5# AW 2x200  Seated hip adduction ball squeeze 2 x 10 with 3 second hold  Seated hip abd with blue band 2x10 with 5 second holds  Step ups onto 6 inch step: with 2 finger support ea UE; x6 lead with L LE, x6 leading with R LE- not today  Standing alternating toe taps to green cones: sets of 12 alternating- PT supervision with gait belt- not today  Alternating step ups on airex: x6 lead with R and x6 lead with L- in parallel bars- not today  PT SBA with gait belt: Not today High knee marches forward through blue ladder x 2 laps  High knee lateral marches through ladder x 2 laps  Diagonal ladder x 2 laps  Reverse walking 4x 10 m  Star drill: PT verbal cue for R or L and step/weight shift forward/lateral/diagonal outside base of support then back to midline- multiple directions/rounds x 4 min  Vitals at end of session: HR 66 SP02% 98%  LE MMT today (12/10/22): Hip flexion: R 4/5, L 4+/5 Hip Abduction: R  4-/5, L 4/5 Hip Extension: R 4-/5 , L 4/5  Not today:  -Hurdles: forward direction in // bars (PT supervision with gait belt) 6 laps, focused on hip flexion strategy for foot clearance; attempted cue for "lift toes" but pt not able to actively perform active DF of ankle against gravity -Side step over hurdles in // bars: 6 laps -Airex: feet together, 3 trials x 30-40 seconds with removing hand support -Airex: tandem stance, 3 trials each R in front/L in front x 20-30 seconds removing hand support (pt notes ankle/lower leg fatigue after this) Standing heel raises: 2x10 Seated toe raises (with PT assist on L) 2x15 to L ant tib fatigue Seated Eversion with towel slide seated on floor x20 to fatigue Seated isometric inversion with ball x20 to fatigue Alternating toe taps : 6 inch height, 2 x5 R LE, 2x5 L LE with finger tip support on railing Standing hip abduction 3# AW x 10 each LE- not today  PATIENT EDUCATION:  Education details: PT POC/goals, importance of attending cardiac rehab, activity pacing Person educated: Patient Education method: Explanation Education comprehension: verbalized understanding and needs further education  HOME EXERCISE PROGRAM: Access Code: [email or web address] URL: [email or web address] Date: 10/24/2022 Prepared by: Max Fickle  Exercises - Seated Heel Raise  - 1 x daily - 3 x weekly - 2 sets - 20 reps - Seated March  - 1 x daily - 3 x weekly - 2 sets - 10 reps - Seated Long Arc Quad  - 1 x daily - 3 x weekly - 2 sets - 10  reps  ASSESSMENT:  CLINICAL IMPRESSION:  Patient demonstrated ability to safely navigate curb during today's session using his SPC and no railing for additional support.  Would benefit from continuing to progress with dynamic balance/gait activities focusing on direction changes and navigating obstacles to improve his safety and reduce fear of falling while still adjusting to his AFO.  Patient will continue to benefit from skilled PT  intervention to address listed impairments to decrease fall risk and improve overall mobility.   OBJECTIVE IMPAIRMENTS: Abnormal gait, cardiopulmonary status limiting activity, decreased activity tolerance, decreased balance, decreased mobility, difficulty walking, decreased ROM, and decreased strength.   ACTIVITY LIMITATIONS: standing, squatting, stairs, transfers, and locomotion level  PARTICIPATION LIMITATIONS: meal prep, cleaning, laundry, interpersonal relationship, shopping, and community activity  PERSONAL FACTORS: Age, Past/current experiences, Time since onset of injury/illness/exacerbation, and cardiovascular hx including recent MI with stent placement last week  are also affecting patient's functional outcome.   REHAB POTENTIAL: Good  CLINICAL DECISION MAKING: Stable/uncomplicated  EVALUATION COMPLEXITY: Low   GOALS: Goals reviewed with patient? Yes  SHORT TERM GOALS: Target date: 11/11/22 Pt will be able to perform a HEP for LE strengthening >3x/week Baseline: 11/27/22 pt has been instructed on a HEP Goal status: In progress   LONG TERM GOALS: Target date: 12/04/22  Improve FOTO to >51 indicating pt able to perform his daily activities without being limited by his balance Baseline: 39 Goal status: In Progress  2.  Pt will be able to amb with typical width BOS and b/l heel strike gait pattern using least restrictive AD and AFO on flat/incline/decline/grass surfaces x 10 min Baseline: not using any AD or AFO in clinic, pt wide BOS, no L heel strike due to foot drop; 11/27/22 pt is amb with SPC now, discussions continue regarding AFO/brace for improved safety Goal status: INITIAL  3.  Pt will be able to perform 5x STS in <15 sec indicating reduced risk for falling Baseline: 21 sec, 11/27/22 19 sec (with mild UE use) Goal status: INITIAL  PLAN:  PT FREQUENCY: 2x/week  PT DURATION: 4 weeks  PLANNED INTERVENTIONS: Therapeutic exercises, Therapeutic activity,  Neuromuscular re-education, Balance training, Gait training, Patient/Family education, Self Care, Joint mobilization, and Orthotic/Fit training  PLAN FOR NEXT SESSION: proximal hip strengthening, multidirectional movements/dynamic balance activities, LE strengthening, gait training with AFO; re-cert at next session  Max Fickle, PT, DPT, OCS  Physical Therapist - [facility]   Ardine Bjork, PT 12/21/2022, 10:55 AM

## 2022-12-24 ENCOUNTER — Ambulatory Visit: Payer: Medicare HMO

## 2022-12-24 DIAGNOSIS — M6281 Muscle weakness (generalized): Secondary | ICD-10-CM

## 2022-12-24 DIAGNOSIS — R2689 Other abnormalities of gait and mobility: Secondary | ICD-10-CM

## 2022-12-24 NOTE — Therapy (Signed)
OUTPATIENT PHYSICAL THERAPY LOWER EXTREMITY TREATMENT/Progress Note/Re-certification through 01/21/23   Patient Name: Devin [name] [facility]. MRN: [id number] DOB:[birth year], 77 y.o., male Today's Date: 12/24/2022  END OF SESSION:  PT End of Session - 12/24/22 1207     Visit Number 18    Number of Visits 26    Date for PT Re-Evaluation 01/21/23    Authorization Type re-cert/PN on 62/13/08 (recert for 2x/week x 4 weeks)- 01/21/23    PT Start Time 1200    PT Stop Time 1245    PT Time Calculation (min) 45 min    Equipment Utilized During Treatment Gait belt    Activity Tolerance Patient tolerated treatment well    Behavior During Therapy WFL for tasks assessed/performed                 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   Hypertension    Ischemic cardiomyopathy    MI, old    Peripheral neuropathy    PFO (patent foramen ovale) 05/2019   s/p repair   Sleep apnea    Spinal stenosis of lumbar region    TIA (transient ischemic attack)    Past Surgical History:  Procedure Laterality Date   APPENDECTOMY     BREAST SURGERY Right    benign mass   CARDIOVERSION Right 12/15/2019   Procedure: CARDIOVERSION;  Surgeon: Laurier Nancy, MD;  Location: [facility] OR[facility];  Service: Cardiovascular;  Laterality: Right;   CATARACT EXTRACTION, BILATERAL     COLONOSCOPY     CORONARY ANGIOPLASTY WITH STENT PLACEMENT     CORONARY ARTERY BYPASS GRAFT  05/2019   LIMA-LAD   CORONARY STENT INTERVENTION N/A 10/15/2022   Procedure: CORONARY STENT INTERVENTION;  Surgeon: Marcina Millard, MD;  Location: [facility];  Service:  Cardiovascular;  Laterality: N/A;   LEFT HEART CATH N/A 10/15/2022   Procedure: Left Heart Cath;  Surgeon: Marcina Millard, MD;  Location: [facility];  Service: Cardiovascular;  Laterality: N/A;   LEFT HEART CATH AND CORS/GRAFTS ANGIOGRAPHY N/A 05/21/2019   Procedure: LEFT HEART CATH AND CORONARY ANGIOGRAPHY;  Surgeon: Laurier Nancy, MD;  Location: [facility] CV [facility];  Service: Cardiovascular;  Laterality: N/A;   LUMBAR LAMINECTOMY/DECOMPRESSION MICRODISCECTOMY N/A 07/04/2020   Procedure: L4-5 LAMINECTOMY;  Surgeon: Lucy Chris, MD;  Location: [facility];  Service: Neurosurgery;  Laterality: N/A;   REPLACEMENT TOTAL KNEE BILATERAL     Patient Active Problem List   Diagnosis Date Noted   NSTEMI (non-ST elevated myocardial infarction) (HCC) 10/13/2022   HFrEF (heart failure with reduced ejection fraction) (HCC) 10/13/2022   Degenerative joint disease (DJD) of lumbar spine 10/12/2022   Frequent falls 05/15/2022   Unsteady gait 04/16/2022   Acute delirium 04/13/2022   Electrolyte abnormality 04/13/2022   Nausea & vomiting 04/13/2022   PAD (peripheral artery disease) (HCC) 09/03/2021   Localized, primary osteoarthritis 11/24/2020   Wound healing, delayed 09/08/2020   History of lumbar surgery 09/08/2020   Chronic ischemic heart disease 07/13/2020   History of MI (myocardial infarction) 07/13/2020   History  of PTCA 1 07/13/2020   Other personal history presenting hazards to health 07/13/2020   Pure hypercholesterolemia 07/13/2020   Elevated lactic acid level 04/19/2020   Generalized weakness 04/09/2020   Fall at home 04/09/2020   Gastroenteritis 04/09/2020   AMS (altered mental status) 04/08/2020   Hypoxia 02/01/2020   Atrial fibrillation status post cardioversion (HCC) 12/22/2019   Congestive heart failure (CHF) (HCC) 12/13/2019   Hyponatremia 12/06/2019   Acute postoperative anemia due to expected blood loss 05/28/2019   Hyperlipidemia 05/28/2019   Ischemic  cardiomyopathy 05/28/2019   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   Unstable angina (HCC) 05/24/2019   Chest pain 05/21/2019   Ischemic chest pain (HCC) 05/16/2019   CAD (coronary artery disease) 05/16/2019   HTN (hypertension) 05/16/2019   Depression 05/16/2019   GERD (gastroesophageal reflux disease) 05/16/2019   Verruca plantaris 01/23/2018   Obstructive sleep apnea syndrome 01/20/2018   Major depressive disorder, single episode, moderate (HCC) 07/05/2016   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 Zada Finders, MD  REFERRING PROVIDER: Dr. Zada Finders, MD/ [name], [facility]  REFERRING DIAG:  R26.9 (ICD-10-CM) - Unspecified abnormalities of gait and mobility  R26.89 (ICD-10-CM) - Other abnormalities of gait and mobility  R29.6 (ICD-10-CM) - Repeated falls    THERAPY DIAG:  Muscle weakness (generalized)  Balance problem  Rationale for Evaluation and Treatment: Rehabilitation  ONSET DATE: at least 2 years  SUBJECTIVE: (From initial evaluation note)  SUBJECTIVE STATEMENT: Pt reports he is having difficulty with gait and balance.  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   He feels like his balance and walking difficulty have worsened recently.  He reports he started falling frequently, he was catching his foot on things and tripping.  The most recent major fall was about 6 months ago.  He is being careful since then and trying to walk more to get stronger and trying to be more aware of his gait pattern.  He was walking with a walker until ~4 months ago, then started using a cane, and then has tried walking  without any assistance.      He feels like his difficulty walking limits his ability to get out of the house as much as he used to because he is fearful of falling.  PLOF- he enjoys getting out of the house for social activities, going out to breakfast too; going grocery shopping a few times/week to get his groceries; prior to his back surgery he walked at the park in [place], hasn't been back though because he is worried about falling though.  He has a home aide that comes a few days a week to assist with household tasks as needed.  Also reports he had a MI a little over a week ago and was in the hospital (8/3-10/16/22)- he states he is referred to begin cardiac rehab as part of his outpatient recovery.  PERTINENT HISTORY: Extensive cardiac history History of lumbar spine surgery- 2022 L4-5 microdiscectomy/laminectomy   PAIN:  Are you having pain? R groin muscle area where he had the cardiac catheterization procedure last week.     PRECAUTIONS: Fall  RED FLAGS: Pt  with recent MI (10/13/22) with stent placement (see chart review)  WEIGHT BEARING RESTRICTIONS: No  FALLS:  Has patient fallen in last 6 months? Yes. Number of falls multiple  LIVING ENVIRONMENT: Lives with: lives alone (has a self reported "lady friend" for social support and enjoys being able to get out the house for community activities, going to restaurants together) Lives in: House/apartment Stairs:  he lives in a single level home with bonus room over the garage, but he does not need to go upstairs.1 step to enter in the garage- able to get in without falling. Has following equipment at home: Single point cane, Walker - 4 wheeled, and Wheelchair (manual) Has a home health aide that comes 2-3x/week if he needs assistance  OCCUPATION: retired  PLOF: Independent  PATIENT GOALS: to address drop foot and be able to manage it better; to strengthen his legs; and to increase his breathing; and improve his balance   NEXT MD  VISIT: yes, next week   OBJECTIVE:   DIAGNOSTIC FINDINGS: no recent lower extremity/spine imaging  PATIENT SURVEYS:  FOTO 39/52  COGNITION: Overall cognitive status: Within functional limits for tasks assessed     SENSATION: Pt reports reduced sensation to light touch L L4-5 dermatomes  EDEMA:  No pitting edema noted on dorsum of foot   POSTURE: pt stands with wide base of support for balance; uses UE support on LE's for sit to stand transfer  PALPATION: (-) TTP distal LE b/l  LOWER EXTREMITY ROM:  Active ROM Right eval Left eval  Hip flexion WNL WNL  Hip extension    Hip abduction    Hip adduction    Hip internal rotation    Hip external rotation    Knee flexion 105 105  Knee extension -5 -5  Ankle dorsiflexion 5 deg PROM 0 deg  Ankle plantarflexion    Great toe extension 45 deg PROM 40 deg   Ankle eversion     (Blank rows = not tested)  LOWER EXTREMITY MMT:  MMT Right eval Left eval  Hip flexion 4 4  Hip extension    Hip abduction    Hip adduction    Hip internal rotation    Hip external rotation    Knee flexion 5 5  Knee extension 4 4  Ankle dorsiflexion 4/5 1/5  Ankle plantarflexion 3/5 3/5  Great toe extension 4/5 2/5  Ankle eversion 4/5 3/5   (Blank rows = not tested)   FUNCTIONAL TESTS:  OUTCOME MEASURES: TEST Outcome Interpretation  5 times sit<>stand 21 seconds >60 yo, >15 sec indicates increased risk for falls  10 meter walk test deferred <1.0 m/s indicates increased risk for falls; limited community ambulator  [employer] Assessment /56   deferred <36/56 (100% risk for falls), 37-45 (80% risk for falls); 46-51 (>50% risk for falls); 52-55 (lower risk <25% of falls)      GAIT: Distance walked: from waiting area into PT gym 50 ft, PT supervision Assistive device utilized: None Level of assistance: Complete Independence Comments: Pt ambulates with decreased gait speed, wide BOS, decreased stride length, excess L hip flexion  strategy to compensate for L foot drop  Pt not able to stand SLS on L without UE support today; able to stand R LE x 5 seconds  TODAY'S TREATMENT:  DATE: 12/24/22 Subjective:  Pt reports the cane and the AFO are helping him walk a little better.  He feels like he is making progress with PT.  He would like to continue with PT to improve his strength, balance, and walking endurance, especially since he got a dog this past week as a companion and his goal is to be able to walk the dog (adult dog) around the block once a day.  Pain: 0/10  Objective:  HR: 58 BPM, SP02% 98% at start of session.  Updated goals today for progress note/Re-cert  OUTCOME MEASURES: TEST Outcome Interpretation  5 times sit<>stand 18 seconds >60 yo, >15 sec indicates increased risk for falls  10 meter walk test .9 m/s <1.0 m/s indicates increased risk for falls; limited community ambulator          Therapeutic Exercises: While wearing L AFO, no SPC during session, gait belt and PT supervision/SBA during standing exercises  Training for amb on uneven outdoor surfaces- incline/decline sidewalk; grass forward/reverse/lateral, curb situation navigation with [facility]- practice stepping up/down curb using SPC in R hand for scenarios where there may not be a railing; stepped down with L LE and pt prefers stepping up with L LE as SPC is in R hand. (10 min)  Sit to stand x 6, 2 sets from chair with airex cushion- no use of UE   Seated knee extension: 5# AW 2x20   Seated marches: 5# AW 2x200  Seated hip adduction ball squeeze 2 x 10 with 3 second hold  Seated hip abd with blue band 2x10 with 5 second holds  Step ups onto 6 inch step: with 2 finger support ea UE; x6 lead with L LE, x6 leading with R LE- not today  Standing alternating toe taps to green cones: sets of 12 alternating- PT supervision  with gait belt- not today  Alternating step ups on airex: x6 lead with R and x6 lead with L- in parallel bars- not today  PT SBA with gait belt: Not today High knee marches forward through blue ladder x 2 laps- not today  High knee lateral marches through ladder x 2 laps- not today  Diagonal ladder x 2 laps- not today  Reverse walking 4x 10 m  Star drill: PT verbal cue for R or L and step/weight shift forward/lateral/diagonal outside base of support then back to midline- multiple directions/rounds x 4 min with PT close supervision/CGA  Vitals at end of session: HR 69 SP02% 97%  LE MMT today (12/10/22): Hip flexion: R 4/5, L 4+/5 Hip Abduction: R 4-/5, L 4/5 Hip Extension: R 4-/5 , L 4/5  Not today:  -Hurdles: forward direction in // bars (PT supervision with gait belt) 6 laps, focused on hip flexion strategy for foot clearance; attempted cue for "lift toes" but pt not able to actively perform active DF of ankle against gravity -Side step over hurdles in // bars: 6 laps -Airex: feet together, 3 trials x 30-40 seconds with removing hand support -Airex: tandem stance, 3 trials each R in front/L in front x 20-30 seconds removing hand support (pt notes ankle/lower leg fatigue after this) Standing heel raises: 2x10 Seated toe raises (with PT assist on L) 2x15 to L ant tib fatigue Seated Eversion with towel slide seated on floor x20 to fatigue Seated isometric inversion with ball x20 to fatigue Alternating toe taps : 6 inch height, 2 x5 R LE, 2x5 L LE with finger tip support on railing Standing hip abduction 3#  AW x 10 each LE- not today  PATIENT EDUCATION:  Education details: PT POC/goals, importance of attending cardiac rehab, activity pacing Person educated: Patient Education method: Explanation Education comprehension: verbalized understanding and needs further education  HOME EXERCISE PROGRAM: Access Code: [email or web address] URL: [email or web address] Date:  10/24/2022 Prepared by: Max Fickle  Exercises - Seated Heel Raise  - 1 x daily - 3 x weekly - 2 sets - 20 reps - Seated March  - 1 x daily - 3 x weekly - 2 sets - 10 reps - Seated Long Arc Quad  - 1 x daily - 3 x weekly - 2 sets - 10 reps  ASSESSMENT:  CLINICAL IMPRESSION:  Patient is making steady progress towards PT goals.  Has yet to obtain maximum benefit from this course of outpatient PT.  Would benefit from continuing to progress with dynamic balance/gait activities focusing on direction changes and navigating obstacles to improve his safety and reduce fear of falling while wearing his AFO.  Also would benefit from continuing to address LE strength deficits and improving walking endurance to be able to walk his dog around the block at home.  Patient will continue to benefit from skilled PT intervention to address listed impairments, to decrease fall risk and improve overall mobility and safety during ambulation.  OBJECTIVE IMPAIRMENTS: Abnormal gait, cardiopulmonary status limiting activity, decreased activity tolerance, decreased balance, decreased mobility, difficulty walking, decreased ROM, and decreased strength.   ACTIVITY LIMITATIONS: standing, squatting, stairs, transfers, and locomotion level  PARTICIPATION LIMITATIONS: meal prep, cleaning, laundry, interpersonal relationship, shopping, and community activity  PERSONAL FACTORS: Age, Past/current experiences, Time since onset of injury/illness/exacerbation, and cardiovascular hx including recent MI with stent placement last week  are also affecting patient's functional outcome.   REHAB POTENTIAL: Good  CLINICAL DECISION MAKING: Stable/uncomplicated  EVALUATION COMPLEXITY: Low   GOALS: Goals reviewed with patient? Yes  SHORT TERM GOALS: Target date: 11/11/22 Pt will be able to perform a HEP for LE strengthening >3x/week Baseline: 11/27/22 pt has been instructed on a HEP; 12/24/22: met Goal status: Met   LONG TERM  GOALS: Target date: 12/04/22  Improve FOTO to >51 indicating pt able to perform his daily activities without being limited by his balance; 12/24/22: in progress Baseline: 39 Goal status: In Progress  2.  Pt will be able to amb with typical width BOS and b/l heel strike gait pattern using least restrictive AD and AFO on flat/incline/decline/grass surfaces x 10 min Baseline: not using any AD or AFO in clinic, pt wide BOS, no L heel strike due to foot drop; 11/27/22 pt is amb with SPC now, discussions continue regarding AFO/brace for improved safety; 12/24/22: pt now wearing AFO and using SPC on uneven surfaces with PT SBA x 5 minutes Goal status: In progress  3.  Pt will be able to perform 5x STS in <15 sec indicating reduced risk for falling Baseline: 21 sec, 11/27/22 19 sec (with mild UE use); 12/28/22: 18 seconds Goal status: In Progress  4. Pt will be able to amb x 1 block distance to walk his dog 1x/day.  Baseline: 1/4-1/2 block (pt self report)  Goal status: New 12/24/22 5. Improve 10 M walk test to >1.0 m/s to promote reduced fall risk and improved community ambulator status  Baseline: 12/24/22 .9 m/s  Goal status: New 12/24/22  PLAN:  PT FREQUENCY: 2x/week  PT DURATION: 4 weeks  PLANNED INTERVENTIONS: Therapeutic exercises, Therapeutic activity, Neuromuscular re-education, Balance training, Gait training, Patient/Family education, Self Care, Joint mobilization,  and Orthotic/Fit training  PLAN FOR NEXT SESSION: continue with proximal hip and LE strengthening, multidirectional movements/dynamic balance activities, gait training with AFO on even and uneven surfaces and working on improving walking endurance as pt's goal is to be able to walk a block to walk his new dog.  Max Fickle, PT, DPT, OCS  Physical Therapist - [facility]   Vinnie Langton [place], [place] 12/24/2022, 12:10 PM

## 2022-12-27 ENCOUNTER — Encounter: Payer: Medicare HMO | Admitting: *Deleted

## 2022-12-27 DIAGNOSIS — I214 Non-ST elevation (NSTEMI) myocardial infarction: Secondary | ICD-10-CM

## 2022-12-27 DIAGNOSIS — Z48812 Encounter for surgical aftercare following surgery on the circulatory system: Secondary | ICD-10-CM | POA: Diagnosis not present

## 2022-12-27 DIAGNOSIS — Z955 Presence of coronary angioplasty implant and graft: Secondary | ICD-10-CM

## 2022-12-27 NOTE — Progress Notes (Signed)
Daily Session Note  Patient Details  Name: Devin [name] [facility] MRN: [id number] Date of Birth: 03/31/1945 Referring Provider:   Flowsheet Row Cardiac Rehab from 11/19/2022 in [facility] [facility]  Referring Provider Dr. Adrian Blackwater MD       Encounter Date: 12/27/2022  Check In:  Session Check In - 12/27/22 0931       Check-In   Supervising physician immediately available to respond to emergencies See telemetry face sheet for immediately available ER MD    Location [facility]Card[facility] Pul[facility] Re[facility]    Staff Present Cora Collum, RN, BSN, CCRP;Joseph Hood, RCP,RRT,BSRT;Maxon Arcadia BS, , Exercise Physiologist;Jakyrie Totherow Katrinka Blazing, RN, [place]    Virtual Visit No    Medication changes reported     No    Fall or balance concerns reported    No    Warm-up and Cool-down Performed on first and last piece of equipment    Resistance Training Performed Yes    VAD Patient? No    PAD/SET Patient? No      Pain Assessment   Currently in Pain? No/denies                Social History   Tobacco Use  Smoking Status Former  Smokeless Tobacco Never  Tobacco Comments   Quit over 40 years ago    Goals Met:  Independence with exercise equipment Exercise tolerated well No report of concerns or symptoms today Strength training completed today  Goals Unmet:  Not Applicable  Comments: Pt able to follow exercise prescription today without complaint.  Will continue to monitor for progression.    Dr. Bethann Punches is Medical Director for [facility] Cardiac Rehabilitation.  Dr. Vida Rigger is Medical Director for [facility] Pulmonary Rehabilitation.

## 2022-12-28 ENCOUNTER — Other Ambulatory Visit: Payer: Self-pay | Admitting: Cardiovascular Disease

## 2022-12-31 ENCOUNTER — Encounter: Payer: Medicare HMO | Admitting: *Deleted

## 2022-12-31 DIAGNOSIS — Z48812 Encounter for surgical aftercare following surgery on the circulatory system: Secondary | ICD-10-CM | POA: Diagnosis not present

## 2022-12-31 DIAGNOSIS — Z955 Presence of coronary angioplasty implant and graft: Secondary | ICD-10-CM

## 2022-12-31 DIAGNOSIS — I214 Non-ST elevation (NSTEMI) myocardial infarction: Secondary | ICD-10-CM

## 2022-12-31 NOTE — Progress Notes (Signed)
Daily Session Note  Patient Details  Name: Devin [name], Devin [name]. MRN: [id number] Date of Birth: [birth year] Referring Provider:   Flowsheet Row Cardiac Rehab from 11/19/2022 in [facility] Cardiac and Pulmonary Rehab  Referring Provider Dr. Adrian Blackwater MD       Encounter Date: 12/31/2022  Check In:  Session Check In - 12/31/22 0950       Check-In   Supervising physician immediately available to respond to emergencies See telemetry face sheet for immediately available ER MD    Location [facility]    Staff Present Cora Collum, RN, BSN, CCRP;Margaret Best, MS, Exercise Physiologist;Maxon Conetta BS, , Exercise Physiologist;Kelly Madilyn Fireman, BS, ACSM CEP, Exercise Physiologist    Virtual Visit No    Medication changes reported     No    Fall or balance concerns reported    No    Warm-up and Cool-down Performed on first and last piece of equipment    Resistance Training Performed Yes    VAD Patient? No    PAD/SET Patient? No      Pain Assessment   Currently in Pain? No/denies                Social History   Tobacco Use  Smoking Status Former  Smokeless Tobacco Never  Tobacco Comments   Quit over 40 years ago    Goals Met:  Independence with exercise equipment Exercise tolerated well No report of concerns or symptoms today  Goals Unmet:  Not Applicable  Comments: Pt able to follow exercise prescription today without complaint.  Will continue to monitor for progression.    Dr. Bethann Punches is Medical Director for [facility] Cardiac Rehabilitation.  Dr. Vida Rigger is Medical Director for [facility] Pulmonary Rehabilitation.

## 2023-01-01 ENCOUNTER — Encounter: Payer: Medicare HMO | Admitting: *Deleted

## 2023-01-01 DIAGNOSIS — Z955 Presence of coronary angioplasty implant and graft: Secondary | ICD-10-CM

## 2023-01-01 DIAGNOSIS — I214 Non-ST elevation (NSTEMI) myocardial infarction: Secondary | ICD-10-CM

## 2023-01-01 DIAGNOSIS — Z48812 Encounter for surgical aftercare following surgery on the circulatory system: Secondary | ICD-10-CM | POA: Diagnosis not present

## 2023-01-01 NOTE — Progress Notes (Signed)
Daily Session Note  Patient Details  Name: Devin [name] [facility]. MRN: [id number] Date of Birth: 08-Jul-1945 Referring Provider:   Flowsheet Row Cardiac Rehab from 11/19/2022 in [facility] Cardiac and Pulmonary Rehab  Referring Provider Dr. Adrian Blackwater MD       Encounter Date: 01/01/2023  Check In:  Session Check In - 01/01/23 1000       Check-In   Supervising physician immediately available to respond to emergencies See telemetry face sheet for immediately available ER MD    Location [facility]    Staff Present Cora Collum, RN, BSN, CCRP;Margaret Best, MS, Exercise Physiologist;Maxon Conetta BS, , Exercise Physiologist;Noah Tickle, BS, Exercise Physiologist    Virtual Visit No    Medication changes reported     No    Fall or balance concerns reported    No    Warm-up and Cool-down Performed on first and last piece of equipment    Resistance Training Performed Yes    VAD Patient? No    PAD/SET Patient? No      Pain Assessment   Currently in Pain? No/denies                Social History   Tobacco Use  Smoking Status Former  Smokeless Tobacco Never  Tobacco Comments   Quit over 40 years ago    Goals Met:  Independence with exercise equipment Exercise tolerated well No report of concerns or symptoms today  Goals Unmet:  Not Applicable  Comments: Pt able to follow exercise prescription today without complaint.  Will continue to monitor for progression.    Dr. Bethann Punches is Medical Director for [facility] Cardiac Rehabilitation.  Dr. Vida Rigger is Medical Director for [facility] Pulmonary Rehabilitation.

## 2023-01-02 ENCOUNTER — Ambulatory Visit: Payer: Medicare HMO

## 2023-01-02 DIAGNOSIS — R2689 Other abnormalities of gait and mobility: Secondary | ICD-10-CM

## 2023-01-02 DIAGNOSIS — M6281 Muscle weakness (generalized): Secondary | ICD-10-CM | POA: Diagnosis not present

## 2023-01-02 NOTE — Therapy (Signed)
OUTPATIENT PHYSICAL THERAPY LOWER EXTREMITY TREATMENT/Re-certification through 01/21/23   Patient Name: Devin [name] [facility]. MRN: [id number] DOB:[birth year], 77 y.o., male Today's Date: 01/02/2023  END OF SESSION:  PT End of Session - 01/02/23 1055     Visit Number 19    Number of Visits 26    Date for PT Re-Evaluation 01/21/23    Authorization Type re-cert/PN on 36/64/40 (recert for 2x/week x 4 weeks)- 01/21/23    PT Start Time 1035    PT Stop Time 1115    PT Time Calculation (min) 40 min    Equipment Utilized During Treatment Gait belt    Activity Tolerance Patient tolerated treatment well    Behavior During Therapy WFL for tasks assessed/performed                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   Hypertension    Ischemic cardiomyopathy    MI, old    Peripheral neuropathy    PFO (patent foramen ovale) 05/2019   s/p repair   Sleep apnea    Spinal stenosis of lumbar region    TIA (transient ischemic attack)    Past Surgical History:  Procedure Laterality Date   APPENDECTOMY     BREAST SURGERY Right    benign mass   CARDIOVERSION Right 12/15/2019   Procedure: CARDIOVERSION;  Surgeon: Laurier Nancy, MD;  Location: [facility] ORS;  Service: Cardiovascular;  Laterality: Right;   CATARACT EXTRACTION, BILATERAL     COLONOSCOPY     CORONARY ANGIOPLASTY WITH STENT PLACEMENT     CORONARY ARTERY BYPASS GRAFT  05/2019   LIMA-LAD   CORONARY STENT INTERVENTION N/A 10/15/2022   Procedure: CORONARY STENT INTERVENTION;  Surgeon: Marcina Millard, MD;  Location: [facility] IN[facility]IVE CV [facility];  Service: Cardiovascular;   Laterality: N/A;   LEFT HEART CATH N/A 10/15/2022   Procedure: Left Heart Cath;  Surgeon: Marcina Millard, MD;  Location: [facility] INV[facility] CV [facility];  Service: Cardiovascular;  Laterality: N/A;   LEFT HEART CATH AND CORS/GRAFTS ANGIOGRAPHY N/A 05/21/2019   Procedure: LEFT HEART CATH AND CORONARY ANGIOGRAPHY;  Surgeon: Laurier Nancy, MD;  Location: [facility] INV[facility] CV [facility];  Service: Cardiovascular;  Laterality: N/A;   LUMBAR LAMINECTOMY/DECOMPRESSION MICRODISCECTOMY N/A 07/04/2020   Procedure: L4-5 LAMINECTOMY;  Surgeon: Lucy Chris, MD;  Location: [facility] OR[facility];  Service: Neurosurgery;  Laterality: N/A;   REPLACEMENT TOTAL KNEE BILATERAL     Patient Active Problem List   Diagnosis Date Noted   NSTEMI (non-ST elevated myocardial infarction) (HCC) 10/13/2022   HFrEF (heart failure with reduced ejection fraction) (HCC) 10/13/2022   Degenerative joint disease (DJD) of lumbar spine 10/12/2022   Frequent falls 05/15/2022   Unsteady gait 04/16/2022   Acute delirium 04/13/2022   Electrolyte abnormality 04/13/2022   Nausea & vomiting 04/13/2022   PAD (peripheral artery disease) (HCC) 09/03/2021   Localized, primary osteoarthritis 11/24/2020   Wound healing, delayed 09/08/2020   History of lumbar surgery 09/08/2020   Chronic ischemic heart disease 07/13/2020   History of MI (myocardial infarction) 07/13/2020   History of PTCA  1 07/13/2020   Other personal history presenting hazards to health 07/13/2020   Pure hypercholesterolemia 07/13/2020   Elevated lactic acid level 04/19/2020   Generalized weakness 04/09/2020   Fall at home 04/09/2020   Gastroenteritis 04/09/2020   AMS (altered mental status) 04/08/2020   Hypoxia 02/01/2020   Atrial fibrillation status post cardioversion (HCC) 12/22/2019   Congestive heart failure (CHF) (HCC) 12/13/2019   Hyponatremia 12/06/2019   Acute postoperative anemia due to expected blood loss 05/28/2019   Hyperlipidemia 05/28/2019   Ischemic cardiomyopathy 05/28/2019    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   Unstable angina (HCC) 05/24/2019   Chest pain 05/21/2019   Ischemic chest pain (HCC) 05/16/2019   CAD (coronary artery disease) 05/16/2019   HTN (hypertension) 05/16/2019   Depression 05/16/2019   GERD (gastroesophageal reflux disease) 05/16/2019   Verruca plantaris 01/23/2018   Obstructive sleep apnea syndrome 01/20/2018   Major depressive disorder, single episode, moderate (HCC) 07/05/2016   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 Zada Finders, MD  REFERRING PROVIDER: Dr. Zada Finders, MD/ [name], [facility]  REFERRING DIAG:  R26.9 (ICD-10-CM) - Unspecified abnormalities of gait and mobility  R26.89 (ICD-10-CM) - Other abnormalities of gait and mobility  R29.6 (ICD-10-CM) - Repeated falls    THERAPY DIAG:  Muscle weakness (generalized)  Balance problem  Rationale for Evaluation and Treatment: Rehabilitation  ONSET DATE: at least 2 years  SUBJECTIVE: (From initial evaluation note)  SUBJECTIVE STATEMENT: Pt reports he is having difficulty with gait and balance.  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   He feels like his balance and walking difficulty have worsened recently.  He reports he started falling frequently, he was catching his foot on things and tripping.  The most recent major fall was about 6 months ago.  He is being careful since then and trying to walk more to get stronger and trying to be more aware of his gait pattern.  He was walking with a walker until ~4 months ago, then started using a cane, and then has tried walking without any assistance.       He feels like his difficulty walking limits his ability to get out of the house as much as he used to because he is fearful of falling.  PLOF- he enjoys getting out of the house for social activities, going out to breakfast too; going grocery shopping a few times/week to get his groceries; prior to his back surgery he walked at the park in [place], hasn't been back though because he is worried about falling though.  He has a home aide that comes a few days a week to assist with household tasks as needed.  Also reports he had a MI a little over a week ago and was in the hospital (8/3-10/16/22)- he states he is referred to begin cardiac rehab as part of his outpatient recovery.  PERTINENT HISTORY: Extensive cardiac history History of lumbar spine surgery- 2022 L4-5 microdiscectomy/laminectomy   PAIN:  Are you having pain? R groin muscle area where he had the cardiac catheterization procedure last week.     PRECAUTIONS: Fall  RED FLAGS: Pt with recent  MI (10/13/22) with stent placement (see chart review)  WEIGHT BEARING RESTRICTIONS: No  FALLS:  Has patient fallen in last 6 months? Yes. Number of falls multiple  LIVING ENVIRONMENT: Lives with: lives alone (has a self reported "lady friend" for social support and enjoys being able to get out the house for community activities, going to restaurants together) Lives in: House/apartment Stairs:  he lives in a single level home with bonus room over the garage, but he does not need to go upstairs.1 step to enter in the garage- able to get in without falling. Has following equipment at home: Single point cane, Walker - 4 wheeled, and Wheelchair (manual) Has a home health aide that comes 2-3x/week if he needs assistance  OCCUPATION: retired  PLOF: Independent  PATIENT GOALS: to address drop foot and be able to manage it better; to strengthen his legs; and to increase his breathing; and improve his balance   NEXT MD VISIT: yes, next week    OBJECTIVE:   DIAGNOSTIC FINDINGS: no recent lower extremity/spine imaging  PATIENT SURVEYS:  FOTO 39/52  COGNITION: Overall cognitive status: Within functional limits for tasks assessed     SENSATION: Pt reports reduced sensation to light touch L L4-5 dermatomes  EDEMA:  No pitting edema noted on dorsum of foot   POSTURE: pt stands with wide base of support for balance; uses UE support on LE's for sit to stand transfer  PALPATION: (-) TTP distal LE b/l  LOWER EXTREMITY ROM:  Active ROM Right eval Left eval  Hip flexion WNL WNL  Hip extension    Hip abduction    Hip adduction    Hip internal rotation    Hip external rotation    Knee flexion 105 105  Knee extension -5 -5  Ankle dorsiflexion 5 deg PROM 0 deg  Ankle plantarflexion    Great toe extension 45 deg PROM 40 deg   Ankle eversion     (Blank rows = not tested)  LOWER EXTREMITY MMT:  MMT Right eval Left eval  Hip flexion 4 4  Hip extension    Hip abduction    Hip adduction    Hip internal rotation    Hip external rotation    Knee flexion 5 5  Knee extension 4 4  Ankle dorsiflexion 4/5 1/5  Ankle plantarflexion 3/5 3/5  Great toe extension 4/5 2/5  Ankle eversion 4/5 3/5   (Blank rows = not tested)   FUNCTIONAL TESTS:  OUTCOME MEASURES: TEST Outcome Interpretation  5 times sit<>stand 21 seconds >60 yo, >15 sec indicates increased risk for falls  10 meter walk test deferred <1.0 m/s indicates increased risk for falls; limited community ambulator  [employer] Assessment /56   deferred <36/56 (100% risk for falls), 37-45 (80% risk for falls); 46-51 (>50% risk for falls); 52-55 (lower risk <25% of falls)      GAIT: Distance walked: from waiting area into PT gym 50 ft, PT supervision Assistive device utilized: None Level of assistance: Complete Independence Comments: Pt ambulates with decreased gait speed, wide BOS, decreased stride length, excess L hip flexion strategy to compensate for L  foot drop  Pt not able to stand SLS on L without UE support today; able to stand R LE x 5 seconds  TODAY'S TREATMENT:  DATE: 01/02/23 Subjective:  Pt reports his fatigue level is "mild/medium" upon arrival.  He is going to get his AFO adjusted this afternoon with the orthotist.  He is has been enjoying the company of his new dog at home.    Pain: 0/10  Objective:  Therapeutic Exercises: While wearing L AFO, no SPC during session, gait belt and PT supervision/SBA during standing exercises  Training for amb on uneven outdoor surfaces- incline/decline sidewalk; grass forward/reverse/lateral, curb situation navigation with [facility]- practice stepping up/down curb using SPC in R hand for scenarios where there may not be a railing; stepped down with L LE and pt prefers stepping up with L LE as SPC is in R hand. (10 min)- not today  Walk 1 loop around the building with PT supervision- used his SPC, focused on navigating uneven/ramp/inclined/declined sidewalk surfaces.  No major loss of balance.  Sit to stand x 7, 2 sets from chair with airex cushion- no use of UE   Seated knee extension: 5# AW 2x20   Seated marches: 5# AW 2x20  Seated hip adduction ball squeeze 2 x 10 with 3 second hold  Seated hip abd with blue band 2x10 with 5 second holds- not today  Step ups onto 6 inch step: with 2 finger support ea UE; x6 lead with L LE, x7 leading with R LE  Standing alternating toe taps to green cones: sets of 12 alternating- PT supervision with gait belt- not today  Alternating step ups on airex: x6 lead with R and x6 lead with L- in parallel bars- not today  PT SBA with gait belt: Not today High knee marches forward through blue ladder x 2 laps- not today  High knee lateral marches through ladder x 2 laps- not today  Diagonal ladder x 2 laps- not today  Reverse walking  4x 10 m- not today  Star drill: PT verbal cue for R or L and step/weight shift forward/lateral/diagonal outside base of support then back to midline- multiple directions/rounds x 4 min with PT close supervision/CGA  LE MMT today (12/10/22): Hip flexion: R 4/5, L 4+/5 Hip Abduction: R 4-/5, L 4/5 Hip Extension: R 4-/5 , L 4/5  (From 12/24/22 PN note) OUTCOME MEASURES: TEST Outcome Interpretation  5 times sit<>stand 18 seconds >60 yo, >15 sec indicates increased risk for falls  10 meter walk test .9 m/s <1.0 m/s indicates increased risk for falls; limited community ambulator          Not today:  -Hurdles: forward direction in // bars (PT supervision with gait belt) 6 laps, focused on hip flexion strategy for foot clearance; attempted cue for "lift toes" but pt not able to actively perform active DF of ankle against gravity -Side step over hurdles in // bars: 6 laps -Airex: feet together, 3 trials x 30-40 seconds with removing hand support -Airex: tandem stance, 3 trials each R in front/L in front x 20-30 seconds removing hand support (pt notes ankle/lower leg fatigue after this) Standing heel raises: 2x10 Seated toe raises (with PT assist on L) 2x15 to L ant tib fatigue Seated Eversion with towel slide seated on floor x20 to fatigue Seated isometric inversion with ball x20 to fatigue Alternating toe taps : 6 inch height, 2 x5 R LE, 2x5 L LE with finger tip support on railing Standing hip abduction 3# AW x 10 each LE- not today  PATIENT EDUCATION:  Education details: PT POC/goals, importance of attending cardiac rehab, activity pacing Person educated: Patient Education  method: Explanation Education comprehension: verbalized understanding and needs further education  HOME EXERCISE PROGRAM: Access Code: [email or web address] URL: [email or web address] Date: 10/24/2022 Prepared by: Max Fickle  Exercises - Seated Heel Raise  - 1 x daily - 3 x weekly - 2 sets - 20 reps -  Seated March  - 1 x daily - 3 x weekly - 2 sets - 10 reps - Seated Long Arc Quad  - 1 x daily - 3 x weekly - 2 sets - 10 reps  ASSESSMENT:  CLINICAL IMPRESSION:  Added walking on uneven/ramp/inclined/declined surfaces into session today to address goal of being able to walk his dog around the block safely at his house.  Able to complete 1 lap around the building with slow gait speed; will measure exact distance at future visit.  No major loss of balance noted; pt scanned environment for obstacles.  Would benefit from continuing to progress with dynamic balance/gait activities focusing on direction changes and navigating obstacles to improve his safety and reduce fear of falling while wearing his AFO.  Also would benefit from continuing to address LE strength deficits and improving walking endurance to be able to walk his dog around the block at home.  Patient will continue to benefit from skilled PT intervention to address listed impairments, to decrease fall risk and improve overall mobility and safety during ambulation.  OBJECTIVE IMPAIRMENTS: Abnormal gait, cardiopulmonary status limiting activity, decreased activity tolerance, decreased balance, decreased mobility, difficulty walking, decreased ROM, and decreased strength.   ACTIVITY LIMITATIONS: standing, squatting, stairs, transfers, and locomotion level  PARTICIPATION LIMITATIONS: meal prep, cleaning, laundry, interpersonal relationship, shopping, and community activity  PERSONAL FACTORS: Age, Past/current experiences, Time since onset of injury/illness/exacerbation, and cardiovascular hx including recent MI with stent placement last week  are also affecting patient's functional outcome.   REHAB POTENTIAL: Good  CLINICAL DECISION MAKING: Stable/uncomplicated  EVALUATION COMPLEXITY: Low   GOALS: Goals reviewed with patient? Yes  SHORT TERM GOALS: Target date: 11/11/22 Pt will be able to perform a HEP for LE strengthening  >3x/week Baseline: 11/27/22 pt has been instructed on a HEP; 12/24/22: met Goal status: Met   LONG TERM GOALS: Target date: 12/04/22  Improve FOTO to >51 indicating pt able to perform his daily activities without being limited by his balance; 12/24/22: in progress Baseline: 39 Goal status: In Progress  2.  Pt will be able to amb with typical width BOS and b/l heel strike gait pattern using least restrictive AD and AFO on flat/incline/decline/grass surfaces x 10 min Baseline: not using any AD or AFO in clinic, pt wide BOS, no L heel strike due to foot drop; 11/27/22 pt is amb with SPC now, discussions continue regarding AFO/brace for improved safety; 12/24/22: pt now wearing AFO and using SPC on uneven surfaces with PT SBA x 5 minutes Goal status: In progress  3.  Pt will be able to perform 5x STS in <15 sec indicating reduced risk for falling Baseline: 21 sec, 11/27/22 19 sec (with mild UE use); 12/28/22: 18 seconds Goal status: In Progress  4. Pt will be able to amb x 1 block distance to walk his dog 1x/day.  Baseline: 1/4-1/2 block (pt self report)  Goal status: New 12/24/22 5. Improve 10 M walk test to >1.0 m/s to promote reduced fall risk and improved community ambulator status  Baseline: 12/24/22 .9 m/s  Goal status: New 12/24/22  PLAN:  PT FREQUENCY: 2x/week  PT DURATION: 4 weeks  PLANNED INTERVENTIONS: Therapeutic exercises, Therapeutic activity, Neuromuscular re-education,  Balance training, Gait training, Patient/Family education, Self Care, Joint mobilization, and Orthotic/Fit training  PLAN FOR NEXT SESSION: continue with proximal hip and LE strengthening, multidirectional movements/dynamic balance activities, gait training with AFO on even and uneven surfaces and working on improving walking endurance as pt's goal is to be able to walk a block to walk his new dog.  Max Fickle, PT, DPT, OCS  Physical Therapist - [facility]   Vinnie Langton Baxter, PT 01/02/2023, 10:59 AM

## 2023-01-03 ENCOUNTER — Encounter: Payer: Medicare HMO | Admitting: *Deleted

## 2023-01-03 DIAGNOSIS — Z955 Presence of coronary angioplasty implant and graft: Secondary | ICD-10-CM

## 2023-01-03 DIAGNOSIS — Z48812 Encounter for surgical aftercare following surgery on the circulatory system: Secondary | ICD-10-CM | POA: Diagnosis not present

## 2023-01-03 DIAGNOSIS — I214 Non-ST elevation (NSTEMI) myocardial infarction: Secondary | ICD-10-CM

## 2023-01-03 NOTE — Progress Notes (Signed)
Daily Session Note  Patient Details  Name: Iwan Aman Lowery [facility]. MRN: [id number] Date of Birth: [birth year] Referring Provider:   Flowsheet Row Cardiac Rehab from 11/19/2022 in [facility] Cardiac and Pulmonary Rehab  Referring Provider Dr. Adrian Blackwater MD       Encounter Date: 01/03/2023  Check In:  Session Check In - 01/03/23 0937       Check-In   Supervising physician immediately available to respond to emergencies See telemetry face sheet for immediately available ER MD    Location [facility]-Cardiac & Pulmonary Rehab    Staff Present Cora Collum, RN, BSN, CCRP;Joseph Hood, RCP,RRT,BSRT;Maxon [place] BS, , Exercise Physiologist;Laterrance Nauta Katrinka Blazing, RN, [place]    Virtual Visit No    Medication changes reported     No    Fall or balance concerns reported    No    Warm-up and Cool-down Performed on first and last piece of equipment    Resistance Training Performed Yes    VAD Patient? No    PAD/SET Patient? No      Pain Assessment   Currently in Pain? No/denies                Social History   Tobacco Use  Smoking Status Former  Smokeless Tobacco Never  Tobacco Comments   Quit over 40 years ago    Goals Met:  Independence with exercise equipment Exercise tolerated well No report of concerns or symptoms today Strength training completed today  Goals Unmet:  Not Applicable  Comments: Pt able to follow exercise prescription today without complaint.  Will continue to monitor for progression.    Dr. Bethann Punches is Medical Director for [facility] Cardiac Rehabilitation.  Dr. Vida Rigger is Medical Director for [facility] Pulmonary Rehabilitation.

## 2023-01-07 ENCOUNTER — Ambulatory Visit: Payer: Medicare HMO

## 2023-01-09 ENCOUNTER — Encounter: Payer: Self-pay | Admitting: *Deleted

## 2023-01-09 ENCOUNTER — Ambulatory Visit: Payer: Medicare HMO

## 2023-01-09 DIAGNOSIS — Z955 Presence of coronary angioplasty implant and graft: Secondary | ICD-10-CM

## 2023-01-09 DIAGNOSIS — I214 Non-ST elevation (NSTEMI) myocardial infarction: Secondary | ICD-10-CM

## 2023-01-09 NOTE — Progress Notes (Signed)
Cardiac Individual Treatment Plan  Patient Details  Name: Devin [name]. MRN: [id number] Date of Birth: 1945-10-02 Referring Provider:   Flowsheet Row Cardiac Rehab from 11/19/2022 in [facility] Cardiac and Pulmonary Rehab  Referring Provider Dr. Adrian Blackwater MD       Initial Encounter Date:  Flowsheet Row Cardiac Rehab from 11/19/2022 in [facility] Cardiac and Pulmonary Rehab  Date 11/19/22       Visit Diagnosis: Status post coronary artery stent placement  NSTEMI (non-ST elevation myocardial infarction) [name])  Patient's Home Medications on Admission:  Current Outpatient Medications:    albuterol (VENTOLIN HFA) 108 (90 Base) MCG/ACT inhaler, Inhale 2 puffs into the lungs every 6 (six) hours as needed for wheezing or shortness of breath., Disp: , Rfl:    amiodarone (PACERONE) 200 MG tablet, Take 200 mg by mouth daily., Disp: , Rfl:    amLODipine (NORVASC) 10 MG tablet, Take 1 tablet by mouth daily., Disp: , Rfl:    apixaban (ELIQUIS) 5 MG TABS tablet, Take 1 tablet (5 mg total) by mouth 2 (two) times daily., Disp: 60 tablet, Rfl: 3   atorvastatin (LIPITOR) 80 MG tablet, Take 80 mg by mouth daily., Disp: , Rfl:    buPROPion (WELLBUTRIN XL) 300 MG 24 hr tablet, Take 300 mg by mouth daily., Disp: , Rfl:    cephALEXin (KEFLEX) 500 MG capsule, Take 500 mg by mouth 3 (three) times daily., Disp: , Rfl:    clopidogrel (PLAVIX) 75 MG tablet, Take 1 tablet (75 mg total) by mouth daily with breakfast., Disp: 30 tablet, Rfl: 11   docusate (COLACE) 60 MG/15ML syrup, Take 60 mg by mouth daily., Disp: , Rfl:    EPINEPHrine 0.3 mg/0.3 mL IJ SOAJ injection, Inject 0.3 mg into the muscle as needed for anaphylaxis., Disp: , Rfl:    ezetimibe (ZETIA) 10 MG tablet, Take 10 mg by mouth daily., Disp: , Rfl:    fluticasone (FLONASE) 50 MCG/ACT nasal spray, SPRAY 2 SPRAYS INTO EACH NOSTRIL EVERY DAY, Disp: , Rfl:    gabapentin (NEURONTIN) 300 MG capsule, Take 300 mg by mouth 2 (two) times daily., Disp: , Rfl:     loratadine (CLARITIN) 10 MG tablet, Take 10 mg by mouth daily., Disp: , Rfl:    metFORMIN (GLUCOPHAGE) 500 MG tablet, Take 1,000 mg by mouth 2 (two) times daily. (Patient not taking: Reported on 12/07/2022), Disp: , Rfl:    metoprolol succinate (TOPROL XL) 25 MG 24 hr tablet, Take 1 tablet (25 mg total) by mouth daily., Disp: 30 tablet, Rfl: 11   nitroGLYCERIN (NITROSTAT) 0.4 MG SL tablet, Place 0.4 mg under the tongue every 5 (five) minutes as needed for chest pain. (Patient not taking: Reported on 12/07/2022), Disp: , Rfl:    omeprazole (PRILOSEC OTC) 20 MG tablet, Take 20 mg by mouth daily., Disp: , Rfl:    sacubitril-valsartan (ENTRESTO) 97-103 MG, TAKE 1 TABLET BY MOUTH TWICE A DAY, Disp: 180 tablet, Rfl: 1  Past Medical History: 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   Hypertension    Ischemic cardiomyopathy  MI, old    Peripheral neuropathy    PFO (patent foramen ovale) 05/2019   s/p repair   Sleep apnea    Spinal stenosis of lumbar region    TIA (transient ischemic attack)     Tobacco Use: Social History   Tobacco Use  Smoking Status Former  Smokeless Tobacco Never  Tobacco Comments   Quit over 40 years ago    Labs: Review Flowsheet  More data exists      Latest Ref Rng & Units 04/08/2020 07/04/2020 04/12/2022 04/13/2022 10/13/2022  Labs for ITP Cardiac and Pulmonary Rehab  Cholestrol 0 - 200 mg/dL - - - - 638   LDL (calc) 0 - 99 mg/dL - - - - 35   HDL-C >75 mg/dL - - - - 59   Trlycerides <150 mg/dL - - - - 55   Hemoglobin A1c 4.8 - 5.6 % 5.8  - - 5.5  6.3   Bicarbonate 20.0 - 28.0 mmol/L - - 23.9  - -  TCO2 22 -  32 mmol/L - 24  - - -  Acid-base deficit 0.0 - 2.0 mmol/L - - 0.1  - -  O2 Saturation % - - 90.3  - -    Details             Exercise Target Goals: Exercise Program Goal: Individual exercise prescription set using results from initial 6 min walk test and THRR while considering  patient's activity barriers and safety.   Exercise Prescription Goal: Initial exercise prescription builds to 30-45 minutes a day of aerobic activity, 2-3 days per week.  Home exercise guidelines will be given to patient during program as part of exercise prescription that the participant will acknowledge.   Education: Aerobic Exercise: - Group verbal and visual presentation on the components of exercise prescription. Introduces F.I.T.T principle from ACSM for exercise prescriptions.  Reviews F.I.T.T. principles of aerobic exercise including progression. Written material given at graduation. Flowsheet Row Cardiac Rehab from 11/19/2022 in [name]  Education need identified 11/19/22       Education: Resistance Exercise: - Group verbal and visual presentation on the components of exercise prescription. Introduces F.I.T.T principle from ACSM for exercise prescriptions  Reviews F.I.T.T. principles of resistance exercise including progression. Written material given at graduation.    Education: Exercise & Equipment Safety: - Individual verbal instruction and demonstration of equipment use and safety with use of the equipment. Flowsheet Row Cardiac Rehab from 11/19/2022 in [name]  Date 11/19/22  Educator NT  Instruction Review Code 1- Verbalizes Understanding       Education: Exercise Physiology & General Exercise Guidelines: - Group verbal and written instruction with models to review the exercise physiology of the cardiovascular system and associated critical values. Provides general exercise guidelines with specific guidelines to those with heart or lung  disease.  Flowsheet Row Cardiac Rehab from 10/07/2019 in [name]  Date 09/23/19  Educator AS  Instruction Review Code 1- Verbalizes Understanding       Education: Flexibility, Balance, Mind/Body Relaxation: - Group verbal and visual presentation with interactive activity on the components of exercise prescription. Introduces F.I.T.T principle from ACSM for exercise prescriptions. Reviews F.I.T.T. principles of flexibility and balance exercise training including progression. Also discusses the mind body connection.  Reviews various relaxation techniques to help reduce and manage stress (i.e. Deep breathing, progressive muscle relaxation, and visualization). Balance handout provided to take home. Written material given at graduation.  Activity Barriers & Risk Stratification:  Activity Barriers & Cardiac Risk Stratification - 11/19/22 1442       Activity Barriers & Cardiac Risk Stratification   Activity Barriers Right Knee Replacement;Left Knee Replacement;Muscular Weakness;Balance Concerns;Other (comment)    Comments drop foot, impaired gait, neuropathy    Cardiac Risk Stratification Moderate             6 Minute Walk:  6 Minute Walk     Row Name 11/19/22 1440         6 Minute Walk   Phase Initial     Distance 620 feet     Walk Time 6 minutes     # of Rest Breaks 0     MPH 1.17     METS 1.02     RPE 11     Perceived Dyspnea  0     VO2 Peak 3.59     Symptoms No     Resting HR 56 bpm     Resting BP 128/68     Resting Oxygen Saturation  97 %     Exercise Oxygen Saturation  during 6 min walk 96 %     Max Ex. HR 68 bpm     Max Ex. BP 138/76     2 Minute Post BP 136/70              Oxygen Initial Assessment:   Oxygen Re-Evaluation:   Oxygen Discharge (Final Oxygen Re-Evaluation):   Initial Exercise Prescription:  Initial Exercise Prescription - 11/19/22 1400       Date of Initial Exercise RX and Referring Provider   Date  11/19/22    Referring Provider Dr. Adrian Blackwater MD      Oxygen   Maintain Oxygen Saturation 88% or higher      Treadmill   MPH 1    Grade 0    Minutes 15    METs 1.8      NuStep   Level 1    SPM 80    Minutes 15    METs 1.02      Arm Ergometer   Level 1    RPM 50    Minutes 15    METs 1.02      Track   Laps 16    Minutes 15    METs 1.87      Prescription Details   Frequency (times per week) 2    Duration Progress to 30 minutes of continuous aerobic without signs/symptoms of physical distress      Intensity   THRR 40-80% of Max Heartrate 90-124    Ratings of Perceived Exertion 11-13    Perceived Dyspnea 0-4      Progression   Progression Continue to progress workloads to maintain intensity without signs/symptoms of physical distress.      Resistance Training   Training Prescription Yes    Weight 4 lb    Reps 10-15             Perform Capillary Blood Glucose checks as needed.  Exercise Prescription Changes:   Exercise Prescription Changes     Row Name 11/19/22 1400 12/13/22 0800 12/26/22 0800 01/09/23 0900       Response to Exercise   Blood Pressure (Admit) 128/68 126/58 108/56 122/60    Blood Pressure (Exercise) 138/76 152/66 142/70 144/64    Blood Pressure (Exit) 136/70 124/68 108/60 118/60    Heart Rate (Admit) 56 bpm 69 bpm 74 bpm 74 bpm  Heart Rate (Exercise) 68 bpm 104 bpm 95 bpm 91 bpm    Heart Rate (Exit) 60 bpm 69 bpm 68 bpm 83 bpm    Oxygen Saturation (Admit) 97 % -- -- --    Oxygen Saturation (Exercise) 96 % -- -- --    Rating of Perceived Exertion (Exercise) 11 13 15 13     Perceived Dyspnea (Exercise) 0 -- -- --    Symptoms none none none none    Comments Results First two weeks of exercise -- --    Duration -- Continue with 30 min of aerobic exercise without signs/symptoms of physical distress. Continue with 30 min of aerobic exercise without signs/symptoms of physical distress. Continue with 30 min of aerobic exercise  without signs/symptoms of physical distress.    Intensity -- THRR unchanged THRR unchanged THRR unchanged      Progression   Progression -- Continue to progress workloads to maintain intensity without signs/symptoms of physical distress. Continue to progress workloads to maintain intensity without signs/symptoms of physical distress. Continue to progress workloads to maintain intensity without signs/symptoms of physical distress.    Average METs -- 2.29 2.09 2.53      Resistance Training   Training Prescription -- Yes Yes Yes    Weight -- none  Pt's doctor did not give clearance for resistance training none  Pt's doctor did not give clearance for resistance training none  Pt's doctor did not give clearance for resistance training    Reps -- 10-15 10-15 10-15      Interval Training   Interval Training -- No No No      Treadmill   MPH -- 1.7 1.2 --    Grade -- 0 0 --    Minutes -- 15 15 --    METs -- 2.3 1.92 --      NuStep   Level -- 4 4 4     Minutes -- 15 15 30     METs -- 3.1 2.7 3      Arm Ergometer   Level -- -- 1 --    Minutes -- -- 15 --    METs -- -- 1 --      T5 Nustep   Level -- -- -- 1    Minutes -- -- -- 15      Biostep-RELP   Level -- 1 -- --    Minutes -- 15 -- --    METs -- 2 -- --      Track   Laps -- 16 25 10     Minutes -- 15 15 15     METs -- 1.87 2.36 1.54      Oxygen   Maintain Oxygen Saturation -- 88% or higher 88% or higher 88% or higher             Exercise Comments:   Exercise Comments     Row Name 11/27/22 0941           Exercise Comments First full day of exercise!  Patient was oriented to gym and equipment including functions, settings, policies, and procedures.  Patient's individual exercise prescription and treatment plan were reviewed.  All starting workloads were established based on the results of the 6 minute walk test done at initial orientation visit.  The plan for exercise progression was also introduced and progression  will be customized based on patient's performance and goals.  AAA parameters  set by his physician explained to patient.  Exercise Goals and Review:   Exercise Goals     Row Name 11/19/22 1442             Exercise Goals   Increase Physical Activity Yes       Intervention Provide advice, education, support and counseling about physical activity/exercise needs.;Develop an individualized exercise prescription for aerobic and resistive training based on initial evaluation findings, risk stratification, comorbidities and participant's personal goals.       Expected Outcomes Long Term: Add in home exercise to make exercise part of routine and to increase amount of physical activity.;Long Term: Exercising regularly at least 3-5 days a week.;Short Term: Attend rehab on a regular basis to increase amount of physical activity.       Increase Strength and Stamina Yes       Intervention Provide advice, education, support and counseling about physical activity/exercise needs.;Develop an individualized exercise prescription for aerobic and resistive training based on initial evaluation findings, risk stratification, comorbidities and participant's personal goals.       Expected Outcomes Short Term: Increase workloads from initial exercise prescription for resistance, speed, and METs.;Short Term: Perform resistance training exercises routinely during rehab and add in resistance training at home;Long Term: Improve cardiorespiratory fitness, muscular endurance and strength as measured by increased METs and functional capacity ( )       Able to understand and use rate of perceived exertion (RPE) scale Yes       Intervention Provide education and explanation on how to use RPE scale       Expected Outcomes Short Term: Able to use RPE daily in rehab to express subjective intensity level;Long Term:  Able to use RPE to guide intensity level when exercising independently       Able to understand  and use Dyspnea scale Yes       Intervention Provide education and explanation on how to use Dyspnea scale       Expected Outcomes Short Term: Able to use Dyspnea scale daily in rehab to express subjective sense of shortness of breath during exertion;Long Term: Able to use Dyspnea scale to guide intensity level when exercising independently       Knowledge and understanding of Target Heart Rate Range (THRR) Yes       Intervention Provide education and explanation of THRR including how the numbers were predicted and where they are located for reference       Expected Outcomes Short Term: Able to state/look up THRR;Short Term: Able to use daily as guideline for intensity in rehab;Long Term: Able to use THRR to govern intensity when exercising independently       Able to check pulse independently Yes       Intervention Provide education and demonstration on how to check pulse in carotid and radial arteries.;Review the importance of being able to check your own pulse for safety during independent exercise       Expected Outcomes Short Term: Able to explain why pulse checking is important during independent exercise;Long Term: Able to check pulse independently and accurately       Understanding of Exercise Prescription Yes       Intervention Provide education, explanation, and written materials on patient's individual exercise prescription       Expected Outcomes Short Term: Able to explain program exercise prescription;Long Term: Able to explain home exercise prescription to exercise independently                Exercise Goals Re-Evaluation :  Exercise  Goals Re-Evaluation     Row Name 11/27/22 0941 12/13/22 0818 12/26/22 0804 01/09/23 0941       Exercise Goal Re-Evaluation   Exercise Goals Review Able to understand and use rate of perceived exertion (RPE) scale;Knowledge and understanding of Target Heart Rate Range (THRR);Understanding of Exercise Prescription;Able to understand and use Dyspnea  scale Increase Physical Activity;Increase Strength and Stamina;Understanding of Exercise Prescription Increase Physical Activity;Increase Strength and Stamina;Understanding of Exercise Prescription Increase Physical Activity;Increase Strength and Stamina;Understanding of Exercise Prescription    Comments Reviewed RPE  and dyspnea scale, THR and program prescription with pt today.  Pt voiced understanding and was given a copy of goals to take home. Devin [name] is off to a good start in the program. He has done well on the treadmill so far, as he increased his workload up to 1.7 mph with no incline. He also has done well at level one on the biostep and improved to level 4 on the T4 nustep. He has not any done any resistance training at this time due to not receiving clearance from his doctor. We will continue to monitor his progress in the program. Devin [name] is doing well in rehab. He has continued to walk the track and increased from 16 laps to 25 laps! He also has has continued to do well at level 4 on the T4 nustep and level 1 on the arm ergometer. He has not done any resistance training at this time due to doctor's restrictions. We will continue to monitor his progress in the program. Devin [name] is doing well in rehab. He has only walked the track once since the last review and was able to do 10 laps. He also continues to work at level 4 on the T4 nustep but was able to exercise for 30 minutes. He also began using the T5 nustep at level 1. We will continue to monitor his progress in the program.    Expected Outcomes Short: Use RPE daily to regulate intensity. Long: Follow program prescription in THR. Short:Continue to follow current exercise prescription. Long: Continue exercise to improve strength and stamina. Short: Increase workload on arm ergometer. Long: Continue exercise to improve strength and stamina. Short: Increase laps on the track back up to previous number. Long: Continue exercise to improve strength and stamina.              Discharge Exercise Prescription (Final Exercise Prescription Changes):  Exercise Prescription Changes - 01/09/23 0900       Response to Exercise   Blood Pressure (Admit) 122/60    Blood Pressure (Exercise) 144/64    Blood Pressure (Exit) 118/60    Heart Rate (Admit) 74 bpm    Heart Rate (Exercise) 91 bpm    Heart Rate (Exit) 83 bpm    Rating of Perceived Exertion (Exercise) 13    Symptoms none    Duration Continue with 30 min of aerobic exercise without signs/symptoms of physical distress.    Intensity THRR unchanged      Progression   Progression Continue to progress workloads to maintain intensity without signs/symptoms of physical distress.    Average METs 2.53      Resistance Training   Training Prescription Yes    Weight none   Pt's doctor did not give clearance for resistance training   Reps 10-15      Interval Training   Interval Training No      NuStep   Level 4    Minutes 30    METs 3  T5 Nustep   Level 1    Minutes 15      Track   Laps 10    Minutes 15    METs 1.54      Oxygen   Maintain Oxygen Saturation 88% or higher             Nutrition:  Target Goals: Understanding of nutrition guidelines, daily intake of sodium 1500mg , cholesterol 200mg , calories 30% from fat and 7% or less from saturated fats, daily to have 5 or more servings of fruits and vegetables.  Education: All About Nutrition: -Group instruction provided by verbal, written material, interactive activities, discussions, models, and posters to present general guidelines for heart healthy nutrition including fat, fiber, MyPlate, the role of sodium in heart healthy nutrition, utilization of the nutrition label, and utilization of this knowledge for meal planning. Follow up email sent as well. Written material given at graduation. Flowsheet Row Cardiac Rehab from 11/19/2022 in [name] Cardiac and Pulmonary Rehab  Education need identified 11/19/22       Biometrics:  Pre  Biometrics - 11/19/22 1443       Pre Biometrics   Height 5' 11.5" (1.816 m)    Weight 205 lb 3.2 oz (93.1 kg)    Waist Circumference 43 inches    Hip Circumference 42 inches    Waist to Hip Ratio 1.02 %    BMI (Calculated) 28.22    Single Leg Stand 1.2 seconds              Nutrition Therapy Plan and Nutrition Goals:  Nutrition Therapy & Goals - 11/27/22 1131       Nutrition Therapy   Diet Carb controlled, Cardiac, low na    Protein (specify units) 90    Fiber 30 grams    Whole Grain Foods 3 servings    Saturated Fats 15 max. grams    Fruits and Vegetables 5 servings/day    Sodium 2 grams      Personal Nutrition Goals   Nutrition Goal Eat a protein at every meal and pair it with a carb    Personal Goal #2 Use protein snacks if needed    Personal Goal #3 Use sweets smartly, and make good snack choices    Comments patient drinking >64oz of water most days. Rarely drinks sugary drinks. He eats 3 meals per day. Has veteran in-home assistance to help him make meals. Likes to go out to restaurants for social interaction, but tries to make good food choices. Reviewed mediterranean diet handout. Educated on types of fats, sources, and how to read labels. He reports he doesn't use salt at home, commended him and educated about sodium limits, how processed foods can have lots in them before arriving to the table. Reviewed his 24hr food recall, encouraged more protein. Recommended greek yogurt, cottage cheese, hard boiled eggs, tuna, chicken, premier protein shakes as ways to boost protein intake. Build out several meals and snacks with foods he likes and will eat, focusing on adequate protein intake, healthy fats and colorful plates with controlled portions of carbs.      Intervention Plan   Intervention Prescribe, educate and counsel regarding individualized specific dietary modifications aiming towards targeted core components such as weight, hypertension, lipid management, diabetes,  heart failure and other comorbidities.;Nutrition handout(s) given to patient.    Expected Outcomes Long Term Goal: Adherence to prescribed nutrition plan.;Short Term Goal: A plan has been developed with personal nutrition goals set during dietitian appointment.;Short Term Goal: Understand  basic principles of dietary content, such as calories, fat, sodium, cholesterol and nutrients.             Nutrition Assessments:  MEDIFICTS Score Key: >=70 Need to make dietary changes  40-70 Heart Healthy Diet <= 40 Therapeutic Level Cholesterol Diet  Flowsheet Row Cardiac Rehab from 11/19/2022 in [name]  Picture Your Plate Total Score on Admission 72      Picture Your Plate Scores: <81 Unhealthy dietary pattern with much room for improvement. 41-50 Dietary pattern unlikely to meet recommendations for good health and room for improvement. 51-60 More healthful dietary pattern, with some room for improvement.  >60 Healthy dietary pattern, although there may be some specific behaviors that could be improved.    Nutrition Goals Re-Evaluation:   Nutrition Goals Discharge (Final Nutrition Goals Re-Evaluation):   Psychosocial: Target Goals: Acknowledge presence or absence of significant depression and/or stress, maximize coping skills, provide positive support system. Participant is able to verbalize types and ability to use techniques and skills needed for reducing stress and depression.   Education: Stress, Anxiety, and Depression - Group verbal and visual presentation to define topics covered.  Reviews how body is impacted by stress, anxiety, and depression.  Also discusses healthy ways to reduce stress and to treat/manage anxiety and depression.  Written material given at graduation.   Education: Sleep Hygiene -Provides group verbal and written instruction about how sleep can affect your health.  Define sleep hygiene, discuss sleep cycles and impact of sleep habits.  Review good sleep hygiene tips.    Initial Review & Psychosocial Screening:  Initial Psych Review & Screening - 11/07/22 1003       Initial Review   Current issues with None Identified      Family Dynamics   Good Support System? Yes   lady friend     Barriers   Psychosocial barriers to participate in program There are no identifiable barriers or psychosocial needs.      Screening Interventions   Interventions Encouraged to exercise;To provide support and resources with identified psychosocial needs;Provide feedback about the scores to participant    Expected Outcomes Short Term goal: Utilizing psychosocial counselor, staff and physician to assist with identification of specific Stressors or current issues interfering with healing process. Setting desired goal for each stressor or current issue identified.;Long Term Goal: Stressors or current issues are controlled or eliminated.;Short Term goal: Identification and review with participant of any Quality of Life or Depression concerns found by scoring the questionnaire.;Long Term goal: The participant improves quality of Life and PHQ9 Scores as seen by post scores and/or verbalization of changes             Quality of Life Scores:   Quality of Life - 11/19/22 1434       Quality of Life   Select Quality of Life      Quality of Life Scores   Health/Function Pre 22.37 %    Socioeconomic Pre 24.07 %    Psych/Spiritual Pre 22 %    Family Pre 30 %    GLOBAL Pre 23.2 %            Scores of 19 and below usually indicate a poorer quality of life in these areas.  A difference of  2-3 points is a clinically meaningful difference.  A difference of 2-3 points in the total score of the Quality of Life Index has been associated with significant improvement in overall quality of life, self-image,  physical symptoms, and general health in studies assessing change in quality of life.  PHQ-9: Review Flowsheet  More data may exist       11/19/2022 01/12/2020 10/28/2019 09/09/2019 07/02/2019  Depression screen PHQ 2/9  Decreased Interest 1 3 2 1 2   Down, Depressed, Hopeless 1 0 0 0 1  PHQ - 2 Score 2 3 2 1 3   Altered sleeping 1 1 3 3 3   Tired, decreased energy 1 2 2  0 2  Change in appetite 1 1 1  0 1  Feeling bad or failure about yourself  0 0 0 0 0  Trouble concentrating 0 0 1 0 0  Moving slowly or fidgety/restless 0 0 1 0 0  Suicidal thoughts 0 1 0 0 0  PHQ-9 Score 5 8 10 4 9   Difficult doing work/chores Somewhat difficult Not difficult at all Somewhat difficult Not difficult at all Not difficult at all    Details           Interpretation of Total Score  Total Score Depression Severity:  1-4 = Minimal depression, 5-9 = Mild depression, 10-14 = Moderate depression, 15-19 = Moderately severe depression, 20-27 = Severe depression   Psychosocial Evaluation and Intervention:  Psychosocial Evaluation - 11/07/22 1020       Psychosocial Evaluation & Interventions   Comments Devin [name] has no barriers to attending the prgram. He has been in program before and wants to work on his leg strength and his balance. He is in PT at this time for balance concerns. He states no concerns with stress. He is working with his physician over his medication. He is ready to start    Expected Outcomes STGAttend all scheduled sessions, work on exercise progression while concentrating on leg strength and improved balance LTG Continues working on exercie progression after discharge    Continue Psychosocial Services  Follow up required by staff             Psychosocial Re-Evaluation:   Psychosocial Discharge (Final Psychosocial Re-Evaluation):   Vocational Rehabilitation: Provide vocational rehab assistance to qualifying candidates.   Vocational Rehab Evaluation & Intervention:  Vocational Rehab - 11/07/22 1019       Initial Vocational Rehab Evaluation & Intervention   Assessment shows need for Vocational Rehabilitation No       Vocational Rehab Re-Evaulation   Comments retired             Education: Education Goals: Education classes will be provided on a variety of topics geared toward better understanding of heart health and risk factor modification. Participant will state understanding/return demonstration of topics presented as noted by education test scores.  Learning Barriers/Preferences:  Learning Barriers/Preferences - 11/07/22 1008       Learning Barriers/Preferences   Learning Barriers None    Learning Preferences None             General Cardiac Education Topics:  AED/CPR: - Group verbal and written instruction with the use of models to demonstrate the basic use of the AED with the basic ABC's of resuscitation.   Anatomy and Cardiac Procedures: - Group verbal and visual presentation and models provide information about basic cardiac anatomy and function. Reviews the testing methods done to diagnose heart disease and the outcomes of the test results. Describes the treatment choices: Medical Management, Angioplasty, or Coronary Bypass Surgery for treating various heart conditions including Myocardial Infarction, Angina, Valve Disease, and Cardiac Arrhythmias.  Written material given at graduation. Flowsheet Row Cardiac Rehab from 11/19/2022 in  [facility]  Education need identified 11/19/22       Medication Safety: - Group verbal and visual instruction to review commonly prescribed medications for heart and lung disease. Reviews the medication, class of the drug, and side effects. Includes the steps to properly store meds and maintain the prescription regimen.  Written material given at graduation. Flowsheet Row Cardiac Rehab from 10/07/2019 in [name]  Date 10/07/19  Educator SB  Instruction Review Code 1- Verbalizes Understanding       Intimacy: - Group verbal instruction through game format to discuss how heart and lung disease can  affect sexual intimacy. Written material given at graduation..   Know Your Numbers and Heart Failure: - Group verbal and visual instruction to discuss disease risk factors for cardiac and pulmonary disease and treatment options.  Reviews associated critical values for Overweight/Obesity, Hypertension, Cholesterol, and Diabetes.  Discusses basics of heart failure: signs/symptoms and treatments.  Introduces Heart Failure Zone chart for action plan for heart failure.  Written material given at graduation.   Infection Prevention: - Provides verbal and written material to individual with discussion of infection control including proper hand washing and proper equipment cleaning during exercise session. Flowsheet Row Cardiac Rehab from 11/19/2022 in [facility]  Date 11/19/22  Educator NT  Instruction Review Code 1- Verbalizes Understanding       Falls Prevention: - Provides verbal and written material to individual with discussion of falls prevention and safety. Flowsheet Row Cardiac Rehab from 11/19/2022 in [name]  Date 11/19/22  Educator NT  Instruction Review Code 1- Verbalizes Understanding       Other: -Provides group and verbal instruction on various topics (see comments)   Knowledge Questionnaire Score:  Knowledge Questionnaire Score - 11/19/22 1429       Knowledge Questionnaire Score   Pre Score 21/26             Core Components/Risk Factors/Patient Goals at Admission:  Personal Goals and Risk Factors at Admission - 11/07/22 1008       Core Components/Risk Factors/Patient Goals on Admission    Weight Management Yes;Weight Maintenance    Intervention Weight Management: Develop a combined nutrition and exercise program designed to reach desired caloric intake, while maintaining appropriate intake of nutrient and fiber, sodium and fats, and appropriate energy expenditure required for the weight goal.    Admit Weight 204 lb  (92.5 kg)    Goal Weight: Long Term 204 lb (92.5 kg)    Expected Outcomes Short Term: Continue to assess and modify interventions until short term weight is achieved;Long Term: Adherence to nutrition and physical activity/exercise program aimed toward attainment of established weight goal;Weight Maintenance: Understanding of the daily nutrition guidelines, which includes 25-35% calories from fat, 7% or less cal from saturated fats, less than 200mg  cholesterol, less than 1.5gm of sodium, & 5 or more servings of fruits and vegetables daily    Diabetes Yes    Intervention Provide education about signs/symptoms and action to take for hypo/hyperglycemia.;Provide education about proper nutrition, including hydration, and aerobic/resistive exercise prescription along with prescribed medications to achieve blood glucose in normal ranges: Fasting glucose 65-99 mg/dL    Expected Outcomes Short Term: Participant verbalizes understanding of the signs/symptoms and immediate care of hyper/hypoglycemia, proper foot care and importance of medication, aerobic/resistive exercise and nutrition plan for blood glucose control.;Long Term: Attainment of HbA1C < 7%.    Heart Failure Yes  Intervention Provide a combined exercise and nutrition program that is supplemented with education, support and counseling about heart failure. Directed toward relieving symptoms such as shortness of breath, decreased exercise tolerance, and extremity edema.    Expected Outcomes Improve functional capacity of life;Short term: Attendance in program 2-3 days a week with increased exercise capacity. Reported lower sodium intake. Reported increased fruit and vegetable intake. Reports medication compliance.;Short term: Daily weights obtained and reported for increase. Utilizing diuretic protocols set by physician.;Long term: Adoption of self-care skills and reduction of barriers for early signs and symptoms recognition and intervention leading to  self-care maintenance.    Hypertension Yes    Intervention Provide education on lifestyle modifcations including regular physical activity/exercise, weight management, moderate sodium restriction and increased consumption of fresh fruit, vegetables, and low fat dairy, alcohol moderation, and smoking cessation.;Monitor prescription use compliance.    Expected Outcomes Short Term: Continued assessment and intervention until BP is < 140/46mm HG in hypertensive participants. < 130/63mm HG in hypertensive participants with diabetes, heart failure or chronic kidney disease.;Long Term: Maintenance of blood pressure at goal levels.    Lipids Yes    Intervention Provide education and support for participant on nutrition & aerobic/resistive exercise along with prescribed medications to achieve LDL 70mg , HDL >40mg .    Expected Outcomes Short Term: Participant states understanding of desired cholesterol values and is compliant with medications prescribed. Participant is following exercise prescription and nutrition guidelines.;Long Term: Cholesterol controlled with medications as prescribed, with individualized exercise RX and with personalized nutrition plan. Value goals: LDL < 70mg , HDL > 40 mg.             Education:Diabetes - Individual verbal and written instruction to review signs/symptoms of diabetes, desired ranges of glucose level fasting, after meals and with exercise. Acknowledge that pre and post exercise glucose checks will be done for 3 sessions at entry of program. Flowsheet Row Cardiac Rehab from 01/13/2020 in [name]  Date 01/13/20  Educator [facility]  Instruction Review Code 1- Verbalizes Understanding       Core Components/Risk Factors/Patient Goals Review:    Core Components/Risk Factors/Patient Goals at Discharge (Final Review):    ITP Comments:  ITP Comments     Row Name 11/07/22 1019 11/19/22 1426 11/27/22 0939 12/12/22 1229 01/09/23 1516   ITP Comments  Virtual orientation call completed today. he has an appointment on Date: [id number]  for EP eval and gym Orientation.  Documentation of diagnosis can be found in [facility]  Date: 10/13/2022  Letter sent for AAA parameters. Completed and gym orientation. Initial ITP created and sent for review to Dr. Daniel Nones, Medical Director. First full day of exercise!  Patient was oriented to gym and equipment including functions, settings, policies, and procedures.  Patient's individual exercise prescription and treatment plan were reviewed.  All starting workloads were established based on the results of the 6 minute walk test done at initial orientation visit.  The plan for exercise progression was also introduced and progression will be customized based on patient's performance and goals. AAA parameters  set by his physician explained to patient. 30 Day review completed. Medical Director ITP review done, changes made as directed, and signed approval by Medical Director.    new to program 30 Day review completed. Medical Director ITP review done, changes made as directed, and signed approval by Medical Director.            Comments:

## 2023-01-10 ENCOUNTER — Encounter: Payer: Medicare HMO | Admitting: *Deleted

## 2023-01-14 ENCOUNTER — Ambulatory Visit: Payer: Medicare HMO | Attending: Family Medicine

## 2023-01-14 DIAGNOSIS — M6281 Muscle weakness (generalized): Secondary | ICD-10-CM | POA: Insufficient documentation

## 2023-01-14 DIAGNOSIS — R2689 Other abnormalities of gait and mobility: Secondary | ICD-10-CM | POA: Diagnosis present

## 2023-01-14 NOTE — Therapy (Signed)
OUTPATIENT PHYSICAL THERAPY LOWER EXTREMITY TREATMENT/Re-certification through 01/21/23   Patient Name: Devin [name] [facility]. MRN: [id number] DOB:[birth year], 77 y.o., male Today's Date: 01/14/2023  END OF SESSION:  PT End of Session - 01/14/23 1155     Visit Number 20    Number of Visits 26    Date for PT Re-Evaluation 01/21/23    Authorization Type re-cert/PN on 01/60/10 (recert for 2x/week x 4 weeks)- 01/21/23    PT Start Time 1200    PT Stop Time 1245    PT Time Calculation (min) 45 min    Equipment Utilized During Treatment Gait belt    Activity Tolerance Patient tolerated treatment well    Behavior During Therapy WFL for tasks assessed/performed                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   Hypertension    Ischemic cardiomyopathy    MI, old    Peripheral neuropathy    PFO (patent foramen ovale) 05/2019   s/p repair   Sleep apnea    Spinal stenosis of lumbar region    TIA (transient ischemic attack)    Past Surgical History:  Procedure Laterality Date   APPENDECTOMY     BREAST SURGERY Right    benign mass   CARDIOVERSION Right 12/15/2019   Procedure: CARDIOVERSION;  Surgeon: Laurier Nancy, MD;  Location: [facility] ORS;  Service: Cardiovascular;  Laterality: Right;   CATARACT EXTRACTION, BILATERAL     COLONOSCOPY     CORONARY ANGIOPLASTY WITH STENT PLACEMENT     CORONARY ARTERY BYPASS GRAFT  05/2019   LIMA-LAD   CORONARY STENT INTERVENTION N/A 10/15/2022   Procedure: CORONARY STENT INTERVENTION;  Surgeon: Marcina Millard, MD;  Location: [facility] IN[facility]ASIVE CV LAB;  Service: Cardiovascular;   Laterality: N/A;   LEFT HEART CATH N/A 10/15/2022   Procedure: Left Heart Cath;  Surgeon: Marcina Millard, MD;  Location: [facility] INV[facility] CV [facility];  Service: Cardiovascular;  Laterality: N/A;   LEFT HEART CATH AND CORS/GRAFTS ANGIOGRAPHY N/A 05/21/2019   Procedure: LEFT HEART CATH AND CORONARY ANGIOGRAPHY;  Surgeon: Laurier Nancy, MD;  Location: [facility] INV[facility] CV [facility];  Service: Cardiovascular;  Laterality: N/A;   LUMBAR LAMINECTOMY/DECOMPRESSION MICRODISCECTOMY N/A 07/04/2020   Procedure: L4-5 LAMINECTOMY;  Surgeon: Lucy Chris, MD;  Location: [facility] ORS;  Service: Neurosurgery;  Laterality: N/A;   REPLACEMENT TOTAL KNEE BILATERAL     Patient Active Problem List   Diagnosis Date Noted   NSTEMI (non-ST elevated myocardial infarction) (HCC) 10/13/2022   HFrEF (heart failure with reduced ejection fraction) (HCC) 10/13/2022   Degenerative joint disease (DJD) of lumbar spine 10/12/2022   Frequent falls 05/15/2022   Unsteady gait 04/16/2022   Acute delirium 04/13/2022   Electrolyte abnormality 04/13/2022   Nausea & vomiting 04/13/2022   PAD (peripheral artery disease) (HCC) 09/03/2021   Localized, primary osteoarthritis 11/24/2020   Wound healing, delayed 09/08/2020   History of lumbar surgery 09/08/2020   Chronic ischemic heart disease 07/13/2020   History of MI (myocardial infarction) 07/13/2020   History of PTCA  1 07/13/2020   Other personal history presenting hazards to health 07/13/2020   Pure hypercholesterolemia 07/13/2020   Elevated lactic acid level 04/19/2020   Generalized weakness 04/09/2020   Fall at home 04/09/2020   Gastroenteritis 04/09/2020   AMS (altered mental status) 04/08/2020   Hypoxia 02/01/2020   Atrial fibrillation status post cardioversion (HCC) 12/22/2019   Congestive heart failure (CHF) (HCC) 12/13/2019   Hyponatremia 12/06/2019   Acute postoperative anemia due to expected blood loss 05/28/2019   Hyperlipidemia 05/28/2019   Ischemic cardiomyopathy 05/28/2019    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   Unstable angina (HCC) 05/24/2019   Chest pain 05/21/2019   Ischemic chest pain (HCC) 05/16/2019   CAD (coronary artery disease) 05/16/2019   HTN (hypertension) 05/16/2019   Depression 05/16/2019   GERD (gastroesophageal reflux disease) 05/16/2019   Verruca plantaris 01/23/2018   Obstructive sleep apnea syndrome 01/20/2018   Major depressive disorder, single episode, moderate (HCC) 07/05/2016   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 Zada Finders, MD  REFERRING PROVIDER: Dr. Zada Finders, MD/ [name], [facility]  REFERRING DIAG:  R26.9 (ICD-10-CM) - Unspecified abnormalities of gait and mobility  R26.89 (ICD-10-CM) - Other abnormalities of gait and mobility  R29.6 (ICD-10-CM) - Repeated falls    THERAPY DIAG:  Muscle weakness (generalized)  Balance problem  Rationale for Evaluation and Treatment: Rehabilitation  ONSET DATE: at least 2 years  SUBJECTIVE: (From initial evaluation note)  SUBJECTIVE STATEMENT: Pt reports he is having difficulty with gait and balance.  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   He feels like his balance and walking difficulty have worsened recently.  He reports he started falling frequently, he was catching his foot on things and tripping.  The most recent major fall was about 6 months ago.  He is being careful since then and trying to walk more to get stronger and trying to be more aware of his gait pattern.  He was walking with a walker until ~4 months ago, then started using a cane, and then has tried walking without any assistance.       He feels like his difficulty walking limits his ability to get out of the house as much as he used to because he is fearful of falling.  PLOF- he enjoys getting out of the house for social activities, going out to breakfast too; going grocery shopping a few times/week to get his groceries; prior to his back surgery he walked at the park in [place], hasn't been back though because he is worried about falling though.  He has a home aide that comes a few days a week to assist with household tasks as needed.  Also reports he had a MI a little over a week ago and was in the hospital (8/3-10/16/22)- he states he is referred to begin cardiac rehab as part of his outpatient recovery.  PERTINENT HISTORY: Extensive cardiac history History of lumbar spine surgery- 2022 L4-5 microdiscectomy/laminectomy   PAIN:  Are you having pain? R groin muscle area where he had the cardiac catheterization procedure last week.     PRECAUTIONS: Fall  RED FLAGS: Pt with recent  MI (10/13/22) with stent placement (see chart review)  WEIGHT BEARING RESTRICTIONS: No  FALLS:  Has patient fallen in last 6 months? Yes. Number of falls multiple  LIVING ENVIRONMENT: Lives with: lives alone (has a self reported "lady friend" for social support and enjoys being able to get out the house for community activities, going to restaurants together) Lives in: House/apartment Stairs:  he lives in a single level home with bonus room over the garage, but he does not need to go upstairs.1 step to enter in the garage- able to get in without falling. Has following equipment at home: Single point cane, Walker - 4 wheeled, and Wheelchair (manual) Has a home health aide that comes 2-3x/week if he needs assistance  OCCUPATION: retired  PLOF: Independent  PATIENT GOALS: to address drop foot and be able to manage it better; to strengthen his legs; and to increase his breathing; and improve his balance   NEXT MD VISIT: yes, next week    OBJECTIVE:   DIAGNOSTIC FINDINGS: no recent lower extremity/spine imaging  PATIENT SURVEYS:  FOTO 39/52  COGNITION: Overall cognitive status: Within functional limits for tasks assessed     SENSATION: Pt reports reduced sensation to light touch L L4-5 dermatomes  EDEMA:  No pitting edema noted on dorsum of foot   POSTURE: pt stands with wide base of support for balance; uses UE support on LE's for sit to stand transfer  PALPATION: (-) TTP distal LE b/l  LOWER EXTREMITY ROM:  Active ROM Right eval Left eval  Hip flexion WNL WNL  Hip extension    Hip abduction    Hip adduction    Hip internal rotation    Hip external rotation    Knee flexion 105 105  Knee extension -5 -5  Ankle dorsiflexion 5 deg PROM 0 deg  Ankle plantarflexion    Great toe extension 45 deg PROM 40 deg   Ankle eversion     (Blank rows = not tested)  LOWER EXTREMITY MMT:  MMT Right eval Left eval  Hip flexion 4 4  Hip extension    Hip abduction    Hip adduction    Hip internal rotation    Hip external rotation    Knee flexion 5 5  Knee extension 4 4  Ankle dorsiflexion 4/5 1/5  Ankle plantarflexion 3/5 3/5  Great toe extension 4/5 2/5  Ankle eversion 4/5 3/5   (Blank rows = not tested)   FUNCTIONAL TESTS:  OUTCOME MEASURES: TEST Outcome Interpretation  5 times sit<>stand 21 seconds >60 yo, >15 sec indicates increased risk for falls  10 meter walk test deferred <1.0 m/s indicates increased risk for falls; limited community ambulator  [employer] Assessment /56   deferred <36/56 (100% risk for falls), 37-45 (80% risk for falls); 46-51 (>50% risk for falls); 52-55 (lower risk <25% of falls)      GAIT: Distance walked: from waiting area into PT gym 50 ft, PT supervision Assistive device utilized: None Level of assistance: Complete Independence Comments: Pt ambulates with decreased gait speed, wide BOS, decreased stride length, excess L hip flexion strategy to compensate for L  foot drop  Pt not able to stand SLS on L without UE support today; able to stand R LE x 5 seconds  TODAY'S TREATMENT:  DATE: 01/14/23 Subjective:  Pt states he took a steroid dose pack and this resulted in increased HR, he reports he got evaluated at ER and then once his EKG was normal he went home.  He stopped the dose pack too.  Overall, he feels like his balance is improving.  He has not fallen.  Has been using the cane.  He saw his orthotist and had a different style AFO ordered, he is waiting on it's arrival.    Pain: 0/10  Objective: HR 86 SpO2 98% BP: 132/78 (Vitals upon arrival)  Therapeutic Exercises: While wearing L AFO, no SPC during session, gait belt and PT supervision/SBA during standing exercises  Training for amb on uneven outdoor surfaces- incline/decline sidewalk; grass forward/reverse/lateral, curb situation navigation with [facility]- practice stepping up/down curb using SPC in R hand for scenarios where there may not be a railing; stepped down with L LE and pt prefers stepping up with L LE as SPC is in R hand. (10 min)  Walk 1 loop around the building with PT supervision- used his SPC, focused on navigating uneven/ramp/inclined/declined sidewalk surfaces.  No major loss of balance. Not today  Obstacle course in parallel bars: 12 minutes Step up/on/off airex, over 6 inch hurdle, cone toe taps R/L x5, step around cone in small space, step up/on/off airex- repeat opposite direction   Sit to stand x 10, x10 with 8# weight held at chest; 2 sets from chair with airex cushion- no use of UE  Seated knee extension: 5# AW 2x20   Seated marches: 5# AW 2x20  Seated hip adduction ball squeeze 2 x 10 with 3 second hold- not today  Seated hip abd with blue band 2x10 with 5 second holds- not today  Step ups onto 6 inch step: with 2 finger support ea UE; x6  lead with L LE, x7 leading with R LE (completed this via curb outdoors)  Standing alternating toe taps to green cones: sets of 12 alternating- PT supervision with gait belt- not today  Alternating step ups on airex: x6 lead with R and x6 lead with L- in parallel bars- not today  PT SBA with gait belt: Not today High knee marches forward through blue ladder x 2 laps- not today  High knee lateral marches through ladder x 2 laps- not today  Diagonal ladder x 2 laps- not today  Reverse walking 4x 10 m- not today  Star drill: PT verbal cue for R or L and step/weight shift forward/lateral/diagonal outside base of support then back to midline- multiple directions/rounds x 4 min with PT close supervision/CGA- not today  Re-checked vitals at end of session:  HR 106 SpO2 98%  LE MMT today (12/10/22): Hip flexion: R 4/5, L 4+/5 Hip Abduction: R 4-/5, L 4/5 Hip Extension: R 4-/5 , L 4/5  (From 12/24/22 PN note) OUTCOME MEASURES: TEST Outcome Interpretation  5 times sit<>stand 18 seconds >60 yo, >15 sec indicates increased risk for falls  10 meter walk test .9 m/s <1.0 m/s indicates increased risk for falls; limited community ambulator          Not today:  -Hurdles: forward direction in // bars (PT supervision with gait belt) 6 laps, focused on hip flexion strategy for foot clearance; attempted cue for "lift toes" but pt not able to actively perform active DF of ankle against gravity -Side step over hurdles in // bars: 6 laps -Airex: feet together, 3 trials x 30-40 seconds with removing hand support -Airex: tandem stance,  3 trials each R in front/L in front x 20-30 seconds removing hand support (pt notes ankle/lower leg fatigue after this) Standing heel raises: 2x10 Seated toe raises (with PT assist on L) 2x15 to L ant tib fatigue Seated Eversion with towel slide seated on floor x20 to fatigue Seated isometric inversion with ball x20 to fatigue Alternating toe taps : 6 inch height, 2 x5  R LE, 2x5 L LE with finger tip support on railing Standing hip abduction 3# AW x 10 each LE- not today  PATIENT EDUCATION:  Education details: PT POC/goals, importance of attending cardiac rehab, activity pacing Person educated: Patient Education method: Explanation Education comprehension: verbalized understanding and needs further education  HOME EXERCISE PROGRAM: Access Code: [email or web address] URL: [email or web address] Date: 10/24/2022 Prepared by: Max Fickle  Exercises - Seated Heel Raise  - 1 x daily - 3 x weekly - 2 sets - 20 reps - Seated March  - 1 x daily - 3 x weekly - 2 sets - 10 reps - Seated Long Arc Quad  - 1 x daily - 3 x weekly - 2 sets - 10 reps  ASSESSMENT:  CLINICAL IMPRESSION: Monitored vital signs throughout session and pt's HR increased as expected with exercise, and stayed in typical range.  He reports no fatigue or SOB during exercise.  A few short seated breaks taken throughout session.  Progressed LE strengthening with increased resistance.  Also was challenged appropriately with obstacle course; cone tap was challenging.  No loss of balance, but had 1 episode of L hip "giving way" during cone taps and required UE support on parallel bars and PT CGA to regain balance.  He would benefit from more practice with SL activities on even and uneven surfaces to improve LE stability and safety during amb.Would benefit from continuing to progress with dynamic balance/gait activities focusing on direction changes and navigating uneven surfaces and obstacles to improve his safety, improve his walking endurance, and reduce fear of falling while wearing his AFO.  Also would benefit from continuing to address LE strength deficits and improving walking endurance to be able to walk his dog around the block at home.  Patient will continue to benefit from skilled PT intervention to address listed impairments, to decrease fall risk and improve overall mobility and safety  during ambulation.  OBJECTIVE IMPAIRMENTS: Abnormal gait, cardiopulmonary status limiting activity, decreased activity tolerance, decreased balance, decreased mobility, difficulty walking, decreased ROM, and decreased strength.   ACTIVITY LIMITATIONS: standing, squatting, stairs, transfers, and locomotion level  PARTICIPATION LIMITATIONS: meal prep, cleaning, laundry, interpersonal relationship, shopping, and community activity  PERSONAL FACTORS: Age, Past/current experiences, Time since onset of injury/illness/exacerbation, and cardiovascular hx including recent MI with stent placement last week  are also affecting patient's functional outcome.   REHAB POTENTIAL: Good  CLINICAL DECISION MAKING: Stable/uncomplicated  EVALUATION COMPLEXITY: Low   GOALS: Goals reviewed with patient? Yes  SHORT TERM GOALS: Target date: 11/11/22 Pt will be able to perform a HEP for LE strengthening >3x/week Baseline: 11/27/22 pt has been instructed on a HEP; 12/24/22: met Goal status: Met   LONG TERM GOALS: Target date: 12/04/22  Improve FOTO to >51 indicating pt able to perform his daily activities without being limited by his balance; 12/24/22: in progress Baseline: 39 Goal status: In Progress  2.  Pt will be able to amb with typical width BOS and b/l heel strike gait pattern using least restrictive AD and AFO on flat/incline/decline/grass surfaces x 10 min Baseline: not using any AD or  AFO in clinic, pt wide BOS, no L heel strike due to foot drop; 11/27/22 pt is amb with SPC now, discussions continue regarding AFO/brace for improved safety; 12/24/22: pt now wearing AFO and using SPC on uneven surfaces with PT SBA x 5 minutes Goal status: In progress  3.  Pt will be able to perform 5x STS in <15 sec indicating reduced risk for falling Baseline: 21 sec, 11/27/22 19 sec (with mild UE use); 12/28/22: 18 seconds Goal status: In Progress  4. Pt will be able to amb x 1 block distance to walk his dog  1x/day.  Baseline: 1/4-1/2 block (pt self report)  Goal status: New 12/24/22 5. Improve 10 M walk test to >1.0 m/s to promote reduced fall risk and improved community ambulator status  Baseline: 12/24/22 .9 m/s  Goal status: New 12/24/22  PLAN:  PT FREQUENCY: 2x/week  PT DURATION: 4 weeks  PLANNED INTERVENTIONS: Therapeutic exercises, Therapeutic activity, Neuromuscular re-education, Balance training, Gait training, Patient/Family education, Self Care, Joint mobilization, and Orthotic/Fit training  PLAN FOR NEXT SESSION: continue with proximal hip and LE strengthening, multidirectional movements/dynamic balance activities, gait training with AFO on even and uneven surfaces and working on improving walking endurance as pt's goal is to be able to walk a block to walk his new dog.  Max Fickle, PT, DPT, OCS  Physical Therapist - [facility]   Vinnie Langton [place], [place] 01/14/2023, 1:11 PM

## 2023-01-15 ENCOUNTER — Encounter: Payer: Medicare HMO | Attending: Cardiovascular Disease | Admitting: *Deleted

## 2023-01-15 DIAGNOSIS — I214 Non-ST elevation (NSTEMI) myocardial infarction: Secondary | ICD-10-CM | POA: Diagnosis present

## 2023-01-15 DIAGNOSIS — I252 Old myocardial infarction: Secondary | ICD-10-CM | POA: Diagnosis not present

## 2023-01-15 DIAGNOSIS — Z48812 Encounter for surgical aftercare following surgery on the circulatory system: Secondary | ICD-10-CM | POA: Insufficient documentation

## 2023-01-15 DIAGNOSIS — Z955 Presence of coronary angioplasty implant and graft: Secondary | ICD-10-CM | POA: Insufficient documentation

## 2023-01-15 NOTE — Progress Notes (Signed)
Daily Session Note  Patient Details  Name: Devin [name]. MRN: [id number] Date of Birth: 1945-10-15 Referring Provider:   Flowsheet Row Cardiac Rehab from 11/19/2022 in [name]  Referring Provider Dr. Adrian Blackwater MD       Encounter Date: 01/15/2023  Check In:  Session Check In - 01/15/23 0927       Check-In   Supervising physician immediately available to respond to emergencies See telemetry face sheet for immediately available ER MD    Location [facility]    Staff Present Cora Collum, RN, BSN, CCRP;Margaret Best, MS, Exercise Physiologist;Noah Tickle, BS, Exercise Physiologist;Maxon Conetta BS, , Exercise Physiologist    Virtual Visit No    Medication changes reported     No    Fall or balance concerns reported    No    Warm-up and Cool-down Performed on first and last piece of equipment    Resistance Training Performed Yes    VAD Patient? No    PAD/SET Patient? No      Pain Assessment   Currently in Pain? No/denies                Social History   Tobacco Use  Smoking Status Former  Smokeless Tobacco Never  Tobacco Comments   Quit over 40 years ago    Goals Met:  Independence with exercise equipment Exercise tolerated well No report of concerns or symptoms today  Goals Unmet:  Not Applicable  Comments: Pt able to follow exercise prescription today without complaint.  Will continue to monitor for progression.    Dr. Bethann Punches is Medical Director for [facility] Cardiac Rehabilitation.  Dr. Vida Rigger is Medical Director for [facility] Pulmonary Rehabilitation.

## 2023-01-16 ENCOUNTER — Ambulatory Visit: Payer: Medicare HMO

## 2023-01-16 DIAGNOSIS — M6281 Muscle weakness (generalized): Secondary | ICD-10-CM

## 2023-01-16 DIAGNOSIS — R2689 Other abnormalities of gait and mobility: Secondary | ICD-10-CM

## 2023-01-16 NOTE — Therapy (Signed)
OUTPATIENT PHYSICAL THERAPY LOWER EXTREMITY TREATMENT/Re-certification through 01/21/23   Patient Name: Nachmen Mansel [facility] MRN: [id number] DOB:[birth year], 77 y.o., male Today's Date: 01/16/2023  END OF SESSION:  PT End of Session - 01/16/23 1244     Visit Number 21    Number of Visits 26    Date for PT Re-Evaluation 01/21/23    Authorization Type re-cert/PN on 34/74/25 (recert for 2x/week x 4 weeks)- 01/21/23    PT Start Time 1220    PT Stop Time 1250    PT Time Calculation (min) 30 min    Equipment Utilized During Treatment Gait belt    Activity Tolerance Patient tolerated treatment well    Behavior During Therapy WFL for tasks assessed/performed                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   Hypertension    Ischemic cardiomyopathy    MI, old    Peripheral neuropathy    PFO (patent foramen ovale) 05/2019   s/p repair   Sleep apnea    Spinal stenosis of lumbar region    TIA (transient ischemic attack)    Past Surgical History:  Procedure Laterality Date   APPENDECTOMY     BREAST SURGERY Right    benign mass   CARDIOVERSION Right 12/15/2019   Procedure: CARDIOVERSION;  Surgeon: Laurier Nancy, MD;  Location: [facility] ORS;  Service: Cardiovascular;  Laterality: Right;   CATARACT EXTRACTION, BILATERAL     COLONOSCOPY     CORONARY ANGIOPLASTY WITH STENT PLACEMENT     CORONARY ARTERY BYPASS GRAFT  05/2019   LIMA-LAD   CORONARY STENT INTERVENTION N/A 10/15/2022   Procedure: CORONARY STENT INTERVENTION;  Surgeon: Marcina Millard, MD;  Location: [facility]IVE [facility];  Service: Cardiovascular;   Laterality: N/A;   LEFT HEART CATH N/A 10/15/2022   Procedure: Left Heart Cath;  Surgeon: Marcina Millard, MD;  Location: [facility];  Service: Cardiovascular;  Laterality: N/A;   LEFT HEART CATH AND CORS/GRAFTS ANGIOGRAPHY N/A 05/21/2019   Procedure: LEFT HEART CATH AND CORONARY ANGIOGRAPHY;  Surgeon: Laurier Nancy, MD;  Location: [facility] IN[facility];  Service: Cardiovascular;  Laterality: N/A;   LUMBAR LAMINECTOMY/DECOMPRESSION MICRODISCECTOMY N/A 07/04/2020   Procedure: L4-5 LAMINECTOMY;  Surgeon: Lucy Chris, MD;  Location: [facility] OR[facility];  Service: Neurosurgery;  Laterality: N/A;   REPLACEMENT TOTAL KNEE BILATERAL     Patient Active Problem List   Diagnosis Date Noted   NSTEMI (non-ST elevated myocardial infarction) (HCC) 10/13/2022   HFrEF (heart failure with reduced ejection fraction) (HCC) 10/13/2022   Degenerative joint disease (DJD) of lumbar spine 10/12/2022   Frequent falls 05/15/2022   Unsteady gait 04/16/2022   Acute delirium 04/13/2022   Electrolyte abnormality 04/13/2022   Nausea & vomiting 04/13/2022   PAD (peripheral artery disease) (HCC) 09/03/2021   Localized, primary osteoarthritis 11/24/2020   Wound healing, delayed 09/08/2020   History of lumbar surgery 09/08/2020   Chronic ischemic heart disease 07/13/2020   History of MI (myocardial infarction) 07/13/2020   History of PTCA  1 07/13/2020   Other personal history presenting hazards to health 07/13/2020   Pure hypercholesterolemia 07/13/2020   Elevated lactic acid level 04/19/2020   Generalized weakness 04/09/2020   Fall at home 04/09/2020   Gastroenteritis 04/09/2020   AMS (altered mental status) 04/08/2020   Hypoxia 02/01/2020   Atrial fibrillation status post cardioversion (HCC) 12/22/2019   Congestive heart failure (CHF) (HCC) 12/13/2019   Hyponatremia 12/06/2019   Acute postoperative anemia due to expected blood loss 05/28/2019   Hyperlipidemia 05/28/2019   Ischemic cardiomyopathy 05/28/2019    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   Unstable angina (HCC) 05/24/2019   Chest pain 05/21/2019   Ischemic chest pain (HCC) 05/16/2019   CAD (coronary artery disease) 05/16/2019   HTN (hypertension) 05/16/2019   Depression 05/16/2019   GERD (gastroesophageal reflux disease) 05/16/2019   Verruca plantaris 01/23/2018   Obstructive sleep apnea syndrome 01/20/2018   Major depressive disorder, single episode, moderate (HCC) 07/05/2016   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 Zada Finders, MD  REFERRING PROVIDER: Dr. Zada Finders, MD/ [name], [facility]  REFERRING DIAG:  R26.9 (ICD-10-CM) - Unspecified abnormalities of gait and mobility  R26.89 (ICD-10-CM) - Other abnormalities of gait and mobility  R29.6 (ICD-10-CM) - Repeated falls    THERAPY DIAG:  Muscle weakness (generalized)  Balance problem  Rationale for Evaluation and Treatment: Rehabilitation  ONSET DATE: at least 2 years  SUBJECTIVE: (From initial evaluation note)  SUBJECTIVE STATEMENT: Pt reports he is having difficulty with gait and balance.  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   He feels like his balance and walking difficulty have worsened recently.  He reports he started falling frequently, he was catching his foot on things and tripping.  The most recent major fall was about 6 months ago.  He is being careful since then and trying to walk more to get stronger and trying to be more aware of his gait pattern.  He was walking with a walker until ~4 months ago, then started using a cane, and then has tried walking without any assistance.       He feels like his difficulty walking limits his ability to get out of the house as much as he used to because he is fearful of falling.  PLOF- he enjoys getting out of the house for social activities, going out to breakfast too; going grocery shopping a few times/week to get his groceries; prior to his back surgery he walked at the park in [place], hasn't been back though because he is worried about falling though.  He has a home aide that comes a few days a week to assist with household tasks as needed.  Also reports he had a MI a little over a week ago and was in the hospital (8/3-10/16/22)- he states he is referred to begin cardiac rehab as part of his outpatient recovery.  PERTINENT HISTORY: Extensive cardiac history History of lumbar spine surgery- 2022 L4-5 microdiscectomy/laminectomy   PAIN:  Are you having pain? R groin muscle area where he had the cardiac catheterization procedure last week.     PRECAUTIONS: Fall  RED FLAGS: Pt with recent  MI (10/13/22) with stent placement (see chart review)  WEIGHT BEARING RESTRICTIONS: No  FALLS:  Has patient fallen in last 6 months? Yes. Number of falls multiple  LIVING ENVIRONMENT: Lives with: lives alone (has a self reported "lady friend" for social support and enjoys being able to get out the house for community activities, going to restaurants together) Lives in: House/apartment Stairs:  he lives in a single level home with bonus room over the garage, but he does not need to go upstairs.1 step to enter in the garage- able to get in without falling. Has following equipment at home: Single point cane, Walker - 4 wheeled, and Wheelchair (manual) Has a home health aide that comes 2-3x/week if he needs assistance  OCCUPATION: retired  PLOF: Independent  PATIENT GOALS: to address drop foot and be able to manage it better; to strengthen his legs; and to increase his breathing; and improve his balance   NEXT MD VISIT: yes, next week    OBJECTIVE:   DIAGNOSTIC FINDINGS: no recent lower extremity/spine imaging  PATIENT SURVEYS:  FOTO 39/52  COGNITION: Overall cognitive status: Within functional limits for tasks assessed     SENSATION: Pt reports reduced sensation to light touch L L4-5 dermatomes  EDEMA:  No pitting edema noted on dorsum of foot   POSTURE: pt stands with wide base of support for balance; uses UE support on LE's for sit to stand transfer  PALPATION: (-) TTP distal LE b/l  LOWER EXTREMITY ROM:  Active ROM Right eval Left eval  Hip flexion WNL WNL  Hip extension    Hip abduction    Hip adduction    Hip internal rotation    Hip external rotation    Knee flexion 105 105  Knee extension -5 -5  Ankle dorsiflexion 5 deg PROM 0 deg  Ankle plantarflexion    Great toe extension 45 deg PROM 40 deg   Ankle eversion     (Blank rows = not tested)  LOWER EXTREMITY MMT:  MMT Right eval Left eval  Hip flexion 4 4  Hip extension    Hip abduction    Hip adduction    Hip internal rotation    Hip external rotation    Knee flexion 5 5  Knee extension 4 4  Ankle dorsiflexion 4/5 1/5  Ankle plantarflexion 3/5 3/5  Great toe extension 4/5 2/5  Ankle eversion 4/5 3/5   (Blank rows = not tested)   FUNCTIONAL TESTS:  OUTCOME MEASURES: TEST Outcome Interpretation  5 times sit<>stand 21 seconds >60 yo, >15 sec indicates increased risk for falls  10 meter walk test deferred <1.0 m/s indicates increased risk for falls; limited community ambulator  [employer] Assessment /56   deferred <36/56 (100% risk for falls), 37-45 (80% risk for falls); 46-51 (>50% risk for falls); 52-55 (lower risk <25% of falls)      GAIT: Distance walked: from waiting area into PT gym 50 ft, PT supervision Assistive device utilized: None Level of assistance: Complete Independence Comments: Pt ambulates with decreased gait speed, wide BOS, decreased stride length, excess L hip flexion strategy to compensate for L  foot drop  Pt not able to stand SLS on L without UE support today; able to stand R LE x 5 seconds  TODAY'S TREATMENT:  DATE: 01/16/23 Subjective:  Pt states he has not had any falls.  He is waiting to hear from orthotist on arrival of his new AFO.  He has a f/u with cardiology on Friday this week.  His updated goal for PT is to be able to walk safely and independently through the airport to be able to travel to see his family in [place].  Pain: 0/10  Objective:  Therapeutic Exercises: abbreviated session as pt arrived 20 min late for appointment today While wearing L AFO, no SPC during session, gait belt and PT supervision/SBA during standing exercises  Training for amb on uneven outdoor surfaces- incline/decline sidewalk; grass forward/reverse/lateral, curb situation navigation with [facility]- practice stepping up/down curb using SPC in R hand for scenarios where there may not be a railing; stepped down with L LE and pt prefers stepping up with L LE as SPC is in R hand. (5 min)  Walk 1 loop around the building with PT supervision/gait belt- used his SPC, focused on navigating uneven/ramp/inclined/declined sidewalk surfaces.  No major loss of balance.   Checked vital signs: HR 91 Sp02 99%  1 min rest  Repeated 1 loop around building with PT supervision/gait belt- used his SPC.    Checked vital signs:  HR 93 Sp02% 98%  2 min rest  Sit to stand x 10, x10 with 8# weight held at chest; 2 sets from chair with airex cushion- no use of UE  Seated knee extension: 5# AW 2x20   Seated marches: 5# AW 2x20  Standing hip abd: 2x10 ea LE  Not today Seated hip adduction ball squeeze 2 x 10 with 3 second hold- not today  Seated hip abd with blue band 2x10 with 5 second holds- not today  Step ups onto 6 inch step: with 2 finger support ea UE; x6 lead with L LE, x7 leading  with R LE (completed this via curb outdoors)  Standing alternating toe taps to green cones: sets of 12 alternating- PT supervision with gait belt- not today  Alternating step ups on airex: x6 lead with R and x6 lead with L- in parallel bars- not today  PT SBA with gait belt: Not today High knee marches forward through blue ladder x 2 laps- not today  High knee lateral marches through ladder x 2 laps- not today  Diagonal ladder x 2 laps- not today  Reverse walking 4x 10 m- not today  Star drill: PT verbal cue for R or L and step/weight shift forward/lateral/diagonal outside base of support then back to midline- multiple directions/rounds x 4 min with PT close supervision/CGA- not today  Obstacle course in parallel bars: not today Step up/on/off airex, over 6 inch hurdle, cone toe taps R/L x5, step around cone in small space, step up/on/off airex- repeat opposite direction  Re-checked vitals at end of session:  HR 91 SpO2 98%  LE MMT today (12/10/22): Hip flexion: R 4/5, L 4+/5 Hip Abduction: R 4-/5, L 4/5 Hip Extension: R 4-/5 , L 4/5  (From 12/24/22 PN note) OUTCOME MEASURES: TEST Outcome Interpretation  5 times sit<>stand 18 seconds >60 yo, >15 sec indicates increased risk for falls  10 meter walk test .9 m/s <1.0 m/s indicates increased risk for falls; limited community ambulator          Not today:  -Hurdles: forward direction in // bars (PT supervision with gait belt) 6 laps, focused on hip flexion strategy for foot clearance; attempted cue for "lift toes" but pt not  able to actively perform active DF of ankle against gravity -Side step over hurdles in // bars: 6 laps -Airex: feet together, 3 trials x 30-40 seconds with removing hand support -Airex: tandem stance, 3 trials each R in front/L in front x 20-30 seconds removing hand support (pt notes ankle/lower leg fatigue after this) Standing heel raises: 2x10 Seated toe raises (with PT assist on L) 2x15 to L ant tib  fatigue Seated Eversion with towel slide seated on floor x20 to fatigue Seated isometric inversion with ball x20 to fatigue Alternating toe taps : 6 inch height, 2 x5 R LE, 2x5 L LE with finger tip support on railing Standing hip abduction 3# AW x 10 each LE- not today  PATIENT EDUCATION:  Education details: PT POC/goals, importance of attending cardiac rehab, activity pacing Person educated: Patient Education method: Explanation Education comprehension: verbalized understanding and needs further education  HOME EXERCISE PROGRAM: Access Code: [email or web address] URL: [email or web address] Date: 10/24/2022 Prepared by: Max Fickle  Exercises - Seated Heel Raise  - 1 x daily - 3 x weekly - 2 sets - 20 reps - Seated March  - 1 x daily - 3 x weekly - 2 sets - 10 reps - Seated Long Arc Quad  - 1 x daily - 3 x weekly - 2 sets - 10 reps  ASSESSMENT:  CLINICAL IMPRESSION: Performed an abbreviated session today and focused tx on walking endurance intervals as he arrived late to appointment.  No major loss of balance during session.  He would benefit from more practice with SL activities on even and uneven surfaces to improve LE stability and safety during amb.Would benefit from continuing to progress with dynamic balance/gait activities focusing on direction changes and navigating uneven surfaces and obstacles to improve his safety, improve his walking endurance, and reduce fear of falling while wearing his AFO.  Also would benefit from continuing to address LE strength deficits and improving walking endurance to be able to walk his dog around the block at home.  Patient will continue to benefit from skilled PT intervention to address listed impairments, to decrease fall risk and improve overall mobility and safety during ambulation.  OBJECTIVE IMPAIRMENTS: Abnormal gait, cardiopulmonary status limiting activity, decreased activity tolerance, decreased balance, decreased mobility,  difficulty walking, decreased ROM, and decreased strength.   ACTIVITY LIMITATIONS: standing, squatting, stairs, transfers, and locomotion level  PARTICIPATION LIMITATIONS: meal prep, cleaning, laundry, interpersonal relationship, shopping, and community activity  PERSONAL FACTORS: Age, Past/current experiences, Time since onset of injury/illness/exacerbation, and cardiovascular hx including recent MI with stent placement last week  are also affecting patient's functional outcome.   REHAB POTENTIAL: Good  CLINICAL DECISION MAKING: Stable/uncomplicated  EVALUATION COMPLEXITY: Low   GOALS: Goals reviewed with patient? Yes  SHORT TERM GOALS: Target date: 11/11/22 Pt will be able to perform a HEP for LE strengthening >3x/week Baseline: 11/27/22 pt has been instructed on a HEP; 12/24/22: met Goal status: Met   LONG TERM GOALS: Target date: 12/04/22  Improve FOTO to >51 indicating pt able to perform his daily activities without being limited by his balance; 12/24/22: in progress Baseline: 39 Goal status: In Progress  2.  Pt will be able to amb with typical width BOS and b/l heel strike gait pattern using least restrictive AD and AFO on flat/incline/decline/grass surfaces x 10 min Baseline: not using any AD or AFO in clinic, pt wide BOS, no L heel strike due to foot drop; 11/27/22 pt is amb with SPC now, discussions continue regarding AFO/brace  for improved safety; 12/24/22: pt now wearing AFO and using SPC on uneven surfaces with PT SBA x 5 minutes Goal status: In progress  3.  Pt will be able to perform 5x STS in <15 sec indicating reduced risk for falling Baseline: 21 sec, 11/27/22 19 sec (with mild UE use); 12/28/22: 18 seconds Goal status: In Progress  4. Pt will be able to amb x 1 block distance to walk his dog 1x/day.  Baseline: 1/4-1/2 block (pt self report)  Goal status: New 12/24/22 5. Improve 10 M walk test to >1.0 m/s to promote reduced fall risk and improved community  ambulator status  Baseline: 12/24/22 .9 m/s  Goal status: New 12/24/22  PLAN:  PT FREQUENCY: 2x/week  PT DURATION: 4 weeks  PLANNED INTERVENTIONS: Therapeutic exercises, Therapeutic activity, Neuromuscular re-education, Balance training, Gait training, Patient/Family education, Self Care, Joint mobilization, and Orthotic/Fit training  PLAN FOR NEXT SESSION: continue with proximal hip and LE strengthening, multidirectional movements/dynamic balance activities, gait training with AFO on even and uneven surfaces and working on improving walking endurance as pt's goal is to be able to walk a block to walk his new dog.  Max Fickle, PT, DPT, OCS  Physical Therapist - [facility]   Vinnie Langton Q[place], [place] 01/16/2023, 1:03 PM

## 2023-01-17 ENCOUNTER — Encounter: Payer: Medicare HMO | Admitting: *Deleted

## 2023-01-18 ENCOUNTER — Encounter: Payer: Self-pay | Admitting: Cardiovascular Disease

## 2023-01-18 ENCOUNTER — Ambulatory Visit: Payer: Medicare HMO | Admitting: Cardiovascular Disease

## 2023-01-18 VITALS — BP 128/70 | HR 98 | Ht 72.0 in | Wt 213.2 lb

## 2023-01-18 DIAGNOSIS — I739 Peripheral vascular disease, unspecified: Secondary | ICD-10-CM | POA: Diagnosis not present

## 2023-01-18 DIAGNOSIS — I25700 Atherosclerosis of coronary artery bypass graft(s), unspecified, with unstable angina pectoris: Secondary | ICD-10-CM

## 2023-01-18 DIAGNOSIS — R0789 Other chest pain: Secondary | ICD-10-CM

## 2023-01-18 DIAGNOSIS — I4891 Unspecified atrial fibrillation: Secondary | ICD-10-CM

## 2023-01-18 DIAGNOSIS — I5020 Unspecified systolic (congestive) heart failure: Secondary | ICD-10-CM

## 2023-01-18 DIAGNOSIS — I2510 Atherosclerotic heart disease of native coronary artery without angina pectoris: Secondary | ICD-10-CM

## 2023-01-18 MED ORDER — METOPROLOL SUCCINATE ER 50 MG PO TB24: 50.0000 mg | ORAL_TABLET | Freq: Every day | ORAL | 11 refills | Status: DC

## 2023-01-18 MED ORDER — PANTOPRAZOLE SODIUM 40 MG PO TBEC: 40.0000 mg | DELAYED_RELEASE_TABLET | Freq: Every day | ORAL | 1 refills | Status: DC

## 2023-01-18 NOTE — Progress Notes (Signed)
Cardiology Office Note   Date:  01/18/2023   ID:  Devin [name]., DOB 03/05/1946, MRN [id number]  PCP:  Dione Housekeeper, MD  Cardiologist:  Adrian Blackwater, MD      History of Present Illness: Devin [name]. is a 77 y.o. 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   Hypertension    Ischemic cardiomyopathy    MI, old    Peripheral neuropathy    PFO (patent foramen ovale) 05/2019   s/p repair   Sleep apnea    Spinal stenosis of lumbar region    TIA (transient ischemic attack)      Past Surgical History:  Procedure Laterality Date   APPENDECTOMY     BREAST SURGERY Right    benign mass   CARDIOVERSION Right 12/15/2019   Procedure: CARDIOVERSION;  Surgeon: Laurier Nancy, MD;  Location: [facility];  Service: Cardiovascular;  Laterality: Right;   CATARACT EXTRACTION, BILATERAL     COLONOSCOPY     CORONARY ANGIOPLASTY WITH STENT PLACEMENT     CORONARY ARTERY BYPASS GRAFT  05/2019   LIMA-LAD   CORONARY STENT INTERVENTION N/A 10/15/2022   Procedure: CORONARY STENT INTERVENTION;  Surgeon: Marcina Millard, MD;  Location: [facility];  Service: Cardiovascular;  Laterality: N/A;   LEFT HEART CATH N/A 10/15/2022   Procedure: Left Heart Cath;  Surgeon: Marcina Millard, MD;  Location: [facility];  Service: Cardiovascular;  Laterality: N/A;   LEFT HEART CATH AND CORS/GRAFTS ANGIOGRAPHY N/A 05/21/2019   Procedure: LEFT HEART CATH AND  CORONARY ANGIOGRAPHY;  Surgeon: Laurier Nancy, MD;  Location: [facility];  Service: Cardiovascular;  Laterality: N/A;   LUMBAR LAMINECTOMY/DECOMPRESSION MICRODISCECTOMY N/A 07/04/2020   Procedure: L4-5 LAMINECTOMY;  Surgeon: Lucy Chris, MD;  Location: [facility];  Service: Neurosurgery;  Laterality: N/A;   REPLACEMENT TOTAL KNEE BILATERAL       Current Outpatient Medications  Medication Sig Dispense Refill   albuterol (VENTOLIN HFA) 108 (90 Base) MCG/ACT inhaler Inhale 2 puffs into the lungs every 6 (six) hours as needed for wheezing or shortness of breath.     amiodarone (PACERONE) 200 MG tablet Take 200 mg by mouth daily.     amLODipine (NORVASC) 10 MG tablet Take 1 tablet by mouth daily.     apixaban (ELIQUIS) 5 MG TABS tablet Take 1 tablet (5 mg total) by mouth 2 (two) times daily. 60 tablet 3   atorvastatin (LIPITOR) 80 MG tablet Take 80 mg by mouth daily.     buPROPion (WELLBUTRIN XL) 300 MG 24 hr tablet Take 300 mg by mouth daily.     cephALEXin (KEFLEX) 500 MG capsule Take 500 mg by mouth 3 (three) times daily.     clopidogrel (PLAVIX) 75 MG tablet Take 1 tablet (75 mg total) by mouth daily with breakfast. 30  tablet 11   docusate (COLACE) 60 MG/15ML syrup Take 60 mg by mouth daily.     EPINEPHrine 0.3 mg/0.3 mL IJ SOAJ injection Inject 0.3 mg into the muscle as needed for anaphylaxis.     ezetimibe (ZETIA) 10 MG tablet Take 10 mg by mouth daily.     fluticasone (FLONASE) 50 MCG/ACT nasal spray SPRAY 2 SPRAYS INTO EACH NOSTRIL EVERY DAY     gabapentin (NEURONTIN) 300 MG capsule Take 300 mg by mouth 2 (two) times daily.     loratadine (CLARITIN) 10 MG tablet Take 10 mg by mouth daily.     metFORMIN (GLUCOPHAGE) 500 MG tablet Take 1,000 mg by mouth 2 (two) times daily.     metoprolol succinate (TOPROL XL) 50 MG 24 hr tablet Take 1 tablet (50 mg total) by mouth daily. Take with or immediately following a meal. 30 tablet 11   nitroGLYCERIN (NITROSTAT) 0.4 MG SL tablet Place  0.4 mg under the tongue every 5 (five) minutes as needed for chest pain.     pantoprazole (PROTONIX) 40 MG tablet Take 1 tablet (40 mg total) by mouth daily. 30 tablet 1   sacubitril-valsartan (ENTRESTO) 97-103 MG TAKE 1 TABLET BY MOUTH TWICE A DAY 180 tablet 1   No current facility-administered medications for this visit.    Allergies:   Morphine, Yellow jacket venom [bee venom], and Trazodone    Social History:   reports that he has quit smoking. He has never used smokeless tobacco. He reports that he does not currently use alcohol. He reports that he does not use drugs.   Family History:  family history includes Osteoarthritis in his mother; Other (age of onset: 58) in his father.    ROS:     Review of Systems  Constitutional: Negative.   HENT: Negative.    Eyes: Negative.   Respiratory: Negative.    Gastrointestinal: Negative.   Genitourinary: Negative.   Musculoskeletal: Negative.   Skin: Negative.   Neurological: Negative.   Endo/Heme/Allergies: Negative.   Psychiatric/Behavioral: Negative.    All other systems reviewed and are negative.     All other systems are reviewed and negative.    PHYSICAL EXAM: VS:  BP 128/70   Pulse 98   Ht 6' (1.829 m)   Wt 213 lb 3.2 oz (96.7 kg)   SpO2 97%   BMI 28.92 kg/m  , BMI Body mass index is 28.92 kg/m. Last weight:  Wt Readings from Last 3 Encounters:  01/18/23 213 lb 3.2 oz (96.7 kg)  12/07/22 208 lb 3.2 oz (94.4 kg)  12/05/22 210 lb 6.4 oz (95.4 kg)     Physical Exam Vitals reviewed.  Constitutional:      Appearance: Normal appearance. He is normal weight.  HENT:     Head: Normocephalic.     Nose: Nose normal.     Mouth/Throat:     Mouth: Mucous membranes are moist.  Eyes:     Pupils: Pupils are equal, round, and reactive to light.  Cardiovascular:     Rate and Rhythm: Normal rate and regular rhythm.     Pulses: Normal pulses.     Heart sounds: Normal heart sounds.  Pulmonary:     Effort: Pulmonary  effort is normal.  Abdominal:     General: Abdomen is flat. Bowel sounds are normal.  Musculoskeletal:        General: Normal range of motion.     Cervical back: Normal range of motion.  Skin:  General: Skin is warm.  Neurological:     General: No focal deficit present.     Mental Status: He is alert.  Psychiatric:        Mood and Affect: Mood normal.       EKG:   Recent Labs: 04/13/2022: ALT 30 10/16/2022: BUN 10; Creatinine, Ser 0.89; Hemoglobin 13.8; Magnesium 2.3; Platelets 336; Potassium 4.1; Sodium 139    Lipid Panel    Component Value Date/Time   CHOL 105 10/13/2022 0313   TRIG 55 10/13/2022 0313   HDL 59 10/13/2022 0313   CHOLHDL 1.8 10/13/2022 0313   VLDL 11 10/13/2022 0313   LDLCALC 35 10/13/2022 0313      Other studies Reviewed: Additional studies/ records that were reviewed today include:  Review of the above records demonstrates:       No data to display            ASSESSMENT AND PLAN:    ICD-10-CM   1. Coronary artery disease involving native coronary artery of native heart without angina pectoris  I25.10 pantoprazole (PROTONIX) 40 MG tablet    2. Atrial fibrillation status post cardioversion (HCC)  I48.91 pantoprazole (PROTONIX) 40 MG tablet   change metoprolol 50 as heart rate 98    3. PAD (peripheral artery disease) (HCC)  I73.9 pantoprazole (PROTONIX) 40 MG tablet    4. Coronary artery disease involving coronary bypass graft with unstable angina pectoris, unspecified whether native or transplanted heart (HCC)  I25.700 pantoprazole (PROTONIX) 40 MG tablet    5. HFrEF (heart failure with reduced ejection fraction) (HCC)  I50.20 pantoprazole (PROTONIX) 40 MG tablet    6. Other chest pain  R07.89    Advise protonix for GERD       Problem List Items Addressed This Visit       Cardiovascular and Mediastinum   CAD (coronary artery disease) - Primary   Relevant Medications   pantoprazole (PROTONIX) 40 MG tablet   metoprolol succinate  (TOPROL XL) 50 MG 24 hr tablet   Atrial fibrillation status post cardioversion (HCC)   Relevant Medications   pantoprazole (PROTONIX) 40 MG tablet   metoprolol succinate (TOPROL XL) 50 MG 24 hr tablet   PAD (peripheral artery disease) (HCC)   Relevant Medications   pantoprazole (PROTONIX) 40 MG tablet   metoprolol succinate (TOPROL XL) 50 MG 24 hr tablet   HFrEF (heart failure with reduced ejection fraction) (HCC)   Relevant Medications   pantoprazole (PROTONIX) 40 MG tablet   metoprolol succinate (TOPROL XL) 50 MG 24 hr tablet     Other   Chest pain       Disposition:   Return in about 2 months (around 03/20/2023).    Total time spent: 30 minutes  Signed,  Adrian Blackwater, MD  01/18/2023 11:37 AM    [facility]

## 2023-01-21 ENCOUNTER — Ambulatory Visit: Payer: Medicare HMO

## 2023-01-21 DIAGNOSIS — R2689 Other abnormalities of gait and mobility: Secondary | ICD-10-CM

## 2023-01-21 DIAGNOSIS — M6281 Muscle weakness (generalized): Secondary | ICD-10-CM | POA: Diagnosis not present

## 2023-01-21 NOTE — Therapy (Addendum)
OUTPATIENT PHYSICAL THERAPY LOWER EXTREMITY TREATMENT/Progress Note/Re-certification through 02/18/23   Patient Name: Devin [name] [facility]. MRN: [id number] DOB:02-05-1946, 77 y.o., male Today's Date: 01/21/2023  END OF SESSION:  PT End of Session - 01/21/23 1204     Visit Number 22    Number of Visits 30    Date for PT Re-Evaluation 02/18/23    Authorization Type re-cert/PN done today 01/21/23 x 4 weeks (8 additional visits)    PT Start Time 1200    PT Stop Time 1245    PT Time Calculation (min) 45 min    Equipment Utilized During Treatment Gait belt    Activity Tolerance Patient tolerated treatment well    Behavior During Therapy WFL for tasks assessed/performed                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   Hypertension    Ischemic cardiomyopathy    MI, old    Peripheral neuropathy    PFO (patent foramen ovale) 05/2019   s/p repair   Sleep apnea    Spinal stenosis of lumbar region    TIA (transient ischemic attack)    Past Surgical History:  Procedure Laterality Date   APPENDECTOMY     BREAST SURGERY Right    benign mass   CARDIOVERSION Right 12/15/2019   Procedure: CARDIOVERSION;  Surgeon: Laurier Nancy, MD;  Location: [facility] ORS;  Service: Cardiovascular;  Laterality: Right;   CATARACT EXTRACTION, BILATERAL     COLONOSCOPY     CORONARY ANGIOPLASTY WITH STENT PLACEMENT     CORONARY ARTERY BYPASS GRAFT  05/2019   LIMA-LAD   CORONARY STENT INTERVENTION N/A 10/15/2022   Procedure: CORONARY STENT INTERVENTION;  Surgeon: Marcina Millard, MD;  Location: [facility] INVASIVE CV LAB;  Service:  Cardiovascular;  Laterality: N/A;   LEFT HEART CATH N/A 10/15/2022   Procedure: Left Heart Cath;  Surgeon: Marcina Millard, MD;  Location: [facility] INVASIVE CV LAB;  Service: Cardiovascular;  Laterality: N/A;   LEFT HEART CATH AND CORS/GRAFTS ANGIOGRAPHY N/A 05/21/2019   Procedure: LEFT HEART CATH AND CORONARY ANGIOGRAPHY;  Surgeon: Laurier Nancy, MD;  Location: [facility] INVASIVE CV LAB;  Service: Cardiovascular;  Laterality: N/A;   LUMBAR LAMINECTOMY/DECOMPRESSION MICRODISCECTOMY N/A 07/04/2020   Procedure: L4-5 LAMINECTOMY;  Surgeon: Lucy Chris, MD;  Location: [facility] ORS;  Service: Neurosurgery;  Laterality: N/A;   REPLACEMENT TOTAL KNEE BILATERAL     Patient Active Problem List   Diagnosis Date Noted   NSTEMI (non-ST elevated myocardial infarction) (HCC) 10/13/2022   HFrEF (heart failure with reduced ejection fraction) (HCC) 10/13/2022   Degenerative joint disease (DJD) of lumbar spine 10/12/2022   Frequent falls 05/15/2022   Unsteady gait 04/16/2022   Acute delirium 04/13/2022   Electrolyte abnormality 04/13/2022   Nausea & vomiting 04/13/2022   PAD (peripheral artery disease) (HCC) 09/03/2021   Localized, primary osteoarthritis 11/24/2020   Wound healing, delayed 09/08/2020   History of lumbar surgery 09/08/2020   Chronic ischemic heart disease 07/13/2020   History of MI (myocardial infarction) 07/13/2020   History of  PTCA 1 07/13/2020   Other personal history presenting hazards to health 07/13/2020   Pure hypercholesterolemia 07/13/2020   Elevated lactic acid level 04/19/2020   Generalized weakness 04/09/2020   Fall at home 04/09/2020   Gastroenteritis 04/09/2020   AMS (altered mental status) 04/08/2020   Hypoxia 02/01/2020   Atrial fibrillation status post cardioversion (HCC) 12/22/2019   Congestive heart failure (CHF) (HCC) 12/13/2019   Hyponatremia 12/06/2019   Acute postoperative anemia due to expected blood loss 05/28/2019   Hyperlipidemia 05/28/2019   Ischemic  cardiomyopathy 05/28/2019   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   Unstable angina (HCC) 05/24/2019   Chest pain 05/21/2019   Ischemic chest pain (HCC) 05/16/2019   CAD (coronary artery disease) 05/16/2019   HTN (hypertension) 05/16/2019   Depression 05/16/2019   GERD (gastroesophageal reflux disease) 05/16/2019   Verruca plantaris 01/23/2018   Obstructive sleep apnea syndrome 01/20/2018   Major depressive disorder, single episode, moderate (HCC) 07/05/2016   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 Zada Finders, MD  REFERRING PROVIDER: Dr. Zada Finders, MD/ [name], [facility]  REFERRING DIAG:  R26.9 (ICD-10-CM) - Unspecified abnormalities of gait and mobility  R26.89 (ICD-10-CM) - Other abnormalities of gait and mobility  R29.6 (ICD-10-CM) - Repeated falls    THERAPY DIAG:  Muscle weakness (generalized)  Balance problem  Rationale for Evaluation and Treatment: Rehabilitation  ONSET DATE: at least 2 years  SUBJECTIVE: (From initial evaluation note)  SUBJECTIVE STATEMENT: Pt reports he is having difficulty with gait and balance.  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   He feels like his balance and walking difficulty have worsened recently.  He reports he started falling frequently, he was catching his foot on things and tripping.  The most recent major fall was about 6 months ago.  He is being careful since then and trying to walk more to get stronger and trying to be more aware of his gait pattern.  He was walking with a walker until ~4 months ago, then started using a cane, and then has tried walking  without any assistance.      He feels like his difficulty walking limits his ability to get out of the house as much as he used to because he is fearful of falling.  PLOF- he enjoys getting out of the house for social activities, going out to breakfast too; going grocery shopping a few times/week to get his groceries; prior to his back surgery he walked at the park in [place], hasn't been back though because he is worried about falling though.  He has a home aide that comes a few days a week to assist with household tasks as needed.  Also reports he had a MI a little over a week ago and was in the hospital (8/3-10/16/22)- he states he is referred to begin cardiac rehab as part of his outpatient recovery.  PERTINENT HISTORY: Extensive cardiac history History of lumbar spine surgery- 2022 L4-5 microdiscectomy/laminectomy   PAIN:  Are you having pain? R groin muscle area where he had the cardiac catheterization procedure last week.     PRECAUTIONS: Fall  RED FLAGS: Pt with  recent MI (10/13/22) with stent placement (see chart review)  WEIGHT BEARING RESTRICTIONS: No  FALLS:  Has patient fallen in last 6 months? Yes. Number of falls multiple  LIVING ENVIRONMENT: Lives with: lives alone (has a self reported "lady friend" for social support and enjoys being able to get out the house for community activities, going to restaurants together) Lives in: House/apartment Stairs:  he lives in a single level home with bonus room over the garage, but he does not need to go upstairs.1 step to enter in the garage- able to get in without falling. Has following equipment at home: Single point cane, Walker - 4 wheeled, and Wheelchair (manual) Has a home health aide that comes 2-3x/week if he needs assistance  OCCUPATION: retired  PLOF: Independent  PATIENT GOALS: to address drop foot and be able to manage it better; to strengthen his legs; and to increase his breathing; and improve his balance   NEXT MD  VISIT: yes, next week   OBJECTIVE:   DIAGNOSTIC FINDINGS: no recent lower extremity/spine imaging  PATIENT SURVEYS:  FOTO 39/52  COGNITION: Overall cognitive status: Within functional limits for tasks assessed     SENSATION: Pt reports reduced sensation to light touch L L4-5 dermatomes  EDEMA:  No pitting edema noted on dorsum of foot   POSTURE: pt stands with wide base of support for balance; uses UE support on LE's for sit to stand transfer  PALPATION: (-) TTP distal LE b/l  LOWER EXTREMITY ROM:  Active ROM Right eval Left eval  Hip flexion WNL WNL  Hip extension    Hip abduction    Hip adduction    Hip internal rotation    Hip external rotation    Knee flexion 105 105  Knee extension -5 -5  Ankle dorsiflexion 5 deg PROM 0 deg  Ankle plantarflexion    Great toe extension 45 deg PROM 40 deg   Ankle eversion     (Blank rows = not tested)  LOWER EXTREMITY MMT:  MMT Right eval Left eval  Hip flexion 4 4  Hip extension    Hip abduction    Hip adduction    Hip internal rotation    Hip external rotation    Knee flexion 5 5  Knee extension 4 4  Ankle dorsiflexion 4/5 1/5  Ankle plantarflexion 3/5 3/5  Great toe extension 4/5 2/5  Ankle eversion 4/5 3/5   (Blank rows = not tested)   FUNCTIONAL TESTS:  OUTCOME MEASURES: TEST Outcome Interpretation  5 times sit<>stand 21 seconds >60 yo, >15 sec indicates increased risk for falls  10 meter walk test deferred <1.0 m/s indicates increased risk for falls; limited community ambulator  [employer] Assessment /56   deferred <36/56 (100% risk for falls), 37-45 (80% risk for falls); 46-51 (>50% risk for falls); 52-55 (lower risk <25% of falls)      GAIT: Distance walked: from waiting area into PT gym 50 ft, PT supervision Assistive device utilized: None Level of assistance: Complete Independence Comments: Pt ambulates with decreased gait speed, wide BOS, decreased stride length, excess L hip flexion  strategy to compensate for L foot drop  Pt not able to stand SLS on L without UE support today; able to stand R LE x 5 seconds  TODAY'S TREATMENT:  DATE: 01/21/23 Subjective:  Pt states he has not had any falls.  He is walking more now.  He is walking around his house without the cane now.  He had a follow up with cardiology last week and it went well.  Continuing his cardiac rehab.  His updated goal for PT is to feel more confidence and less fearful of falling while amb around other people out in public (expresses fear of falling if gets bumped in a crowd or at store) and to be able to walk safely and independently through the airport to be able to travel to see his family in [place].  Overall, he feels like he is moving better since starting PT.  He gets his new AFO this week and would like to adjust to this and possibly transition away from using his [facility] as much when he has his new AFO.  Pain: 0/10  Objective: HR 70 SP02% 99  Therapeutic Exercises:  Updated goals and objective measures obtained for progress note today  While wearing L AFO, no SPC during session, gait belt and PT supervision/SBA during standing exercises  Training for amb on uneven outdoor surfaces- incline/decline sidewalk; grass forward/reverse/lateral, curb situation navigation with [employer]- practice stepping up/down curb using SPC in R hand for scenarios where there may not be a railing; stepped down with L LE and pt prefers stepping up with L LE as SPC is in R hand. (5 min)  Walk 1 loop around the building with PT supervision/gait belt- used his SPC, focused on navigating uneven/ramp/inclined/declined sidewalk surfaces.  No major loss of balance. Time 5:50 today.   (From 12/24/22 PN note) OUTCOME MEASURES: TEST Outcome 12/24/22 Today 01/21/23 Interpretation  5 times sit<>stand 18 seconds (with use  of UE support) 19 seconds WITHOUT use of UE support >32 yo, >15 sec indicates increased risk for falls  10 meter walk test .9 m/s .8 m/s today (performed at end of session though, suspect fatigue influence objective measure) <1.0 m/s indicates increased risk for falls; limited community ambulator  FOTO Was 39 at initial eval 51 (predicted >52 at DC)     Not today Seated hip adduction ball squeeze 2 x 10 with 3 second hold- not today  Seated hip abd with blue band 2x10 with 5 second holds- not today  Step ups onto 6 inch step: with 2 finger support ea UE; x6 lead with L LE, x7 leading with R LE (completed this via curb outdoors)  Standing alternating toe taps to green cones: sets of 12 alternating- PT supervision with gait belt- not today  Alternating step ups on airex: x6 lead with R and x6 lead with L- in parallel bars- not today  PT SBA with gait belt: Not today High knee marches forward through blue ladder x 2 laps- not today  High knee lateral marches through ladder x 2 laps- not today  Diagonal ladder x 2 laps- not today  Reverse walking 4x 10 m- not today  Star drill: PT verbal cue for R or L and step/weight shift forward/lateral/diagonal outside base of support then back to midline- multiple directions/rounds x 4 min with PT close supervision/CGA- not today  Obstacle course in parallel bars: not today Step up/on/off airex, over 6 inch hurdle, cone toe taps R/L x5, step around cone in small space, step up/on/off airex- repeat opposite direction  Sit to stand x 10, x10 with 8# weight held at chest; 2 sets from chair with airex cushion- no use of UE  Seated knee extension: 5# AW 2x20   Seated marches: 5# AW 2x20   Not today:  -Hurdles: forward direction in // bars (PT supervision with gait belt) 6 laps, focused on hip flexion strategy for foot clearance; attempted cue for "lift toes" but pt not able to actively perform active DF of ankle against gravity -Side step over  hurdles in // bars: 6 laps -Airex: feet together, 3 trials x 30-40 seconds with removing hand support -Airex: tandem stance, 3 trials each R in front/L in front x 20-30 seconds removing hand support (pt notes ankle/lower leg fatigue after this) Standing heel raises: 2x10 Seated toe raises (with PT assist on L) 2x15 to L ant tib fatigue Seated Eversion with towel slide seated on floor x20 to fatigue Seated isometric inversion with ball x20 to fatigue Alternating toe taps : 6 inch height, 2 x5 R LE, 2x5 L LE with finger tip support on railing Standing hip abduction 3# AW x 10 each LE- not today  PATIENT EDUCATION:  Education details: PT POC/goals, importance of attending cardiac rehab, activity pacing Person educated: Patient Education method: Explanation Education comprehension: verbalized understanding and needs further education  HOME EXERCISE PROGRAM: Access Code: [email or web address] URL: [email or web address] Date: 10/24/2022 Prepared by: Max Fickle  Exercises - Seated Heel Raise  - 1 x daily - 3 x weekly - 2 sets - 20 reps - Seated March  - 1 x daily - 3 x weekly - 2 sets - 10 reps - Seated Long Arc Quad  - 1 x daily - 3 x weekly - 2 sets - 10 reps  ASSESSMENT:  CLINICAL IMPRESSION: Pt is making progress towards goals with PT.  Improvements noted with 5X STS, FOTO, and walking endurance since starting this course of PT.  Updated goals today to reflect pt's goals for PT.  He would benefit from continuing to progress with dynamic balance/gait activities focusing on direction changes and navigating uneven surfaces and obstacles to improve his safety, improve his walking endurance, and reduce fear of falling while wearing his new AFO he expects to get this week.  Also would benefit from continuing to address LE strength deficits and improving walking endurance to be able to walk his dog around the block at home.  Patient will continue to benefit from skilled PT intervention to  address listed impairments, to decrease fall risk and improve overall mobility and safety during ambulation.  Likely has not met maximum recovery with skilled PT.  Anticipate 1 additional month of tx to promote optimal long term outcome with PT.  OBJECTIVE IMPAIRMENTS: Abnormal gait, cardiopulmonary status limiting activity, decreased activity tolerance, decreased balance, decreased mobility, difficulty walking, decreased ROM, and decreased strength.   ACTIVITY LIMITATIONS: standing, squatting, stairs, transfers, and locomotion level  PARTICIPATION LIMITATIONS: meal prep, cleaning, laundry, interpersonal relationship, shopping, and community activity  PERSONAL FACTORS: Age, Past/current experiences, Time since onset of injury/illness/exacerbation, and cardiovascular hx including recent MI with stent placement last week  are also affecting patient's functional outcome.   REHAB POTENTIAL: Good  CLINICAL DECISION MAKING: Stable/uncomplicated  EVALUATION COMPLEXITY: Low   GOALS: Goals reviewed with patient? Yes  SHORT TERM GOALS: Target date: 11/11/22 Pt will be able to perform a HEP for LE strengthening >3x/week Baseline: 11/27/22 pt has been instructed on a HEP; 12/24/22: met Goal status: Met   LONG TERM GOALS: Target date: 02/18/23  Improve FOTO to >51 indicating pt able to perform his daily activities without being limited by his balance; 12/24/22: in progress Baseline:  39; 10/7: 54; 01/21/23 51 Goal status: In Progress  2.  Pt will be able to amb with typical width BOS and b/l heel strike gait pattern using least restrictive AD and AFO on flat/incline/decline/grass surfaces x 10 min Baseline: not using any AD or AFO in clinic, pt wide BOS, no L heel strike due to foot drop; 11/27/22 pt is amb with SPC now, discussions continue regarding AFO/brace for improved safety; 12/24/22: pt now wearing AFO and using SPC on uneven surfaces with PT SBA x 5 minutes; 01/21/23: in progress- able to amb  intervals of 5-6 min x 2 with 1-2 min sitting break between Goal status: In progress  3.  Pt will be able to perform 5x STS in <15 sec indicating reduced risk for falling Baseline: 21 sec, 11/27/22 19 sec (with mild UE use); 12/28/22: 18 seconds  (with mild UE support) 01/21/23:  19 seconds without UE support Goal status: In Progress  4. Pt will be able to amb x 1 block distance to walk his dog 1x/day.  Baseline: 1/4-1/2 block (pt self report), 01/21/23: amb 1 loop around building in 5.25 minutes at clinic  Goal status: In progress  5. Improve 10 M walk test to >1.0 m/s to promote reduced fall risk and improved community ambulator status  Baseline: 12/24/22 .9 m/s; 01/21/23: .8 m/s (at end of session, was fatigued)  Goal status: In progress  PLAN:  PT FREQUENCY: 2x/week  PT DURATION: 4 weeks  PLANNED INTERVENTIONS: Therapeutic exercises, Therapeutic activity, Neuromuscular re-education, Balance training, Gait training, Patient/Family education, Self Care, Joint mobilization, and Orthotic/Fit training  PLAN FOR NEXT SESSION: continue with proximal hip and LE strengthening, multidirectional movements/dynamic balance activities, improving confidence/reducing fear of falling through activities in small spaces and that require increased reaction speed to unpredictable external stimuli, gait training with his new AFO he gets later this week on even and uneven surfaces and working on improving walking endurance   Max Fickle, PT, DPT, OCS  Physical Therapist - [facility]   Ardine Bjork, PT 01/21/2023, 2:49 PM

## 2023-01-22 DIAGNOSIS — I214 Non-ST elevation (NSTEMI) myocardial infarction: Secondary | ICD-10-CM

## 2023-01-22 DIAGNOSIS — Z955 Presence of coronary angioplasty implant and graft: Secondary | ICD-10-CM

## 2023-01-22 DIAGNOSIS — Z48812 Encounter for surgical aftercare following surgery on the circulatory system: Secondary | ICD-10-CM | POA: Diagnosis not present

## 2023-01-22 NOTE — Progress Notes (Signed)
Daily Session Note  Patient Details  Name: Devin [name] [facility]. MRN: [id number] Date of Birth: 11-17-1945 Referring Provider:   Flowsheet Row Cardiac Rehab from 11/19/2022 in [facility] Cardiac and Pulmonary Rehab  Referring Provider Dr. Adrian Blackwater MD       Encounter Date: 01/22/2023  Check In:  Session Check In - 01/22/23 0934       Check-In   Supervising physician immediately available to respond to emergencies See telemetry face sheet for immediately available ER MD    Location [facility]-Cardiac & Pulmonary Rehab    Staff Present Maxon Conetta BS, , Exercise Physiologist;Annison Birchard, RN, BSN;Margaret Best, MS, Exercise Physiologist;Noah Tickle, BS, Exercise Physiologist    Virtual Visit No    Medication changes reported     No    Fall or balance concerns reported    No    Tobacco Cessation No Change    Warm-up and Cool-down Performed on first and last piece of equipment    Resistance Training Performed Yes    VAD Patient? No    PAD/SET Patient? No      Pain Assessment   Currently in Pain? No/denies                Social History   Tobacco Use  Smoking Status Former  Smokeless Tobacco Never  Tobacco Comments   Quit over 40 years ago    Goals Met:  Independence with exercise equipment Exercise tolerated well No report of concerns or symptoms today Strength training completed today  Goals Unmet:  Not Applicable  Comments: Pt able to follow exercise prescription today without complaint.  Will continue to monitor for progression.   Dr. Bethann Punches is Medical Director for [facility] Cardiac Rehabilitation.  Dr. Vida Rigger is Medical Director for [facility] Pulmonary Rehabilitation.

## 2023-01-23 ENCOUNTER — Ambulatory Visit: Payer: Medicare HMO

## 2023-01-23 DIAGNOSIS — M6281 Muscle weakness (generalized): Secondary | ICD-10-CM

## 2023-01-23 DIAGNOSIS — R2689 Other abnormalities of gait and mobility: Secondary | ICD-10-CM

## 2023-01-27 NOTE — Therapy (Signed)
OUTPATIENT PHYSICAL THERAPY LOWER EXTREMITY TREATMENT Re-certification through 02/18/23   Patient Name: Devin [name] [facility]. MRN: [id number] DOB:Jul 16, 1945, 77 y.o., male Today's Date: 01/27/2023  END OF SESSION:  PT End of Session - 01/27/23 1410     Visit Number 23    Number of Visits 30    Date for PT Re-Evaluation 02/18/23    Authorization Type re-cert/PN done today 01/21/23 x 4 weeks (8 additional visits)    PT Start Time 1200    PT Stop Time 1245    PT Time Calculation (min) 45 min    Equipment Utilized During Treatment Gait belt    Activity Tolerance Patient tolerated treatment well    Behavior During Therapy WFL for tasks assessed/performed                 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   Hypertension    Ischemic cardiomyopathy    MI, old    Peripheral neuropathy    PFO (patent foramen ovale) 05/2019   s/p repair   Sleep apnea    Spinal stenosis of lumbar region    TIA (transient ischemic attack)    Past Surgical History:  Procedure Laterality Date   APPENDECTOMY     BREAST SURGERY Right    benign mass   CARDIOVERSION Right 12/15/2019   Procedure: CARDIOVERSION;  Surgeon: Laurier Nancy, MD;  Location: [facility] ORS;  Service: Cardiovascular;  Laterality: Right;   CATARACT EXTRACTION, BILATERAL     COLONOSCOPY     CORONARY ANGIOPLASTY WITH STENT PLACEMENT     CORONARY ARTERY BYPASS GRAFT  05/2019   LIMA-LAD   CORONARY STENT INTERVENTION N/A 10/15/2022   Procedure: CORONARY STENT INTERVENTION;  Surgeon: Marcina Millard, MD;  Location: [facility];  Service: Cardiovascular;   Laterality: N/A;   LEFT HEART CATH N/A 10/15/2022   Procedure: Left Heart Cath;  Surgeon: Marcina Millard, MD;  Location: [facility];  Service: Cardiovascular;  Laterality: N/A;   LEFT HEART CATH AND CORS/GRAFTS ANGIOGRAPHY N/A 05/21/2019   Procedure: LEFT HEART CATH AND CORONARY ANGIOGRAPHY;  Surgeon: Laurier Nancy, MD;  Location: [facility];  Service: Cardiovascular;  Laterality: N/A;   LUMBAR LAMINECTOMY/DECOMPRESSION MICRODISCECTOMY N/A 07/04/2020   Procedure: L4-5 LAMINECTOMY;  Surgeon: Lucy Chris, MD;  Location: [facility];  Service: Neurosurgery;  Laterality: N/A;   REPLACEMENT TOTAL KNEE BILATERAL     Patient Active Problem List   Diagnosis Date Noted   NSTEMI (non-ST elevated myocardial infarction) (HCC) 10/13/2022   HFrEF (heart failure with reduced ejection fraction) (HCC) 10/13/2022   Degenerative joint disease (DJD) of lumbar spine 10/12/2022   Frequent falls 05/15/2022   Unsteady gait 04/16/2022   Acute delirium 04/13/2022   Electrolyte abnormality 04/13/2022   Nausea & vomiting 04/13/2022   PAD (peripheral artery disease) (HCC) 09/03/2021   Localized, primary osteoarthritis 11/24/2020   Wound healing, delayed 09/08/2020   History of lumbar surgery 09/08/2020   Chronic ischemic heart disease 07/13/2020   History of MI (myocardial infarction) 07/13/2020   History  of PTCA 1 07/13/2020   Other personal history presenting hazards to health 07/13/2020   Pure hypercholesterolemia 07/13/2020   Elevated lactic acid level 04/19/2020   Generalized weakness 04/09/2020   Fall at home 04/09/2020   Gastroenteritis 04/09/2020   AMS (altered mental status) 04/08/2020   Hypoxia 02/01/2020   Atrial fibrillation status post cardioversion (HCC) 12/22/2019   Congestive heart failure (CHF) (HCC) 12/13/2019   Hyponatremia 12/06/2019   Acute postoperative anemia due to expected blood loss 05/28/2019   Hyperlipidemia 05/28/2019   Ischemic cardiomyopathy 05/28/2019    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   Unstable angina (HCC) 05/24/2019   Chest pain 05/21/2019   Ischemic chest pain (HCC) 05/16/2019   CAD (coronary artery disease) 05/16/2019   HTN (hypertension) 05/16/2019   Depression 05/16/2019   GERD (gastroesophageal reflux disease) 05/16/2019   Verruca plantaris 01/23/2018   Obstructive sleep apnea syndrome 01/20/2018   Major depressive disorder, single episode, moderate (HCC) 07/05/2016   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 Zada Finders, MD  REFERRING PROVIDER: Dr. Zada Finders, MD/ [name], [facility]  REFERRING DIAG:  R26.9 (ICD-10-CM) - Unspecified abnormalities of gait and mobility  R26.89 (ICD-10-CM) - Other abnormalities of gait and mobility  R29.6 (ICD-10-CM) - Repeated falls    THERAPY DIAG:  Muscle weakness (generalized)  Balance problem  Rationale for Evaluation and Treatment: Rehabilitation  ONSET DATE: at least 2 years  SUBJECTIVE: (From initial evaluation note)  SUBJECTIVE STATEMENT: Pt reports he is having difficulty with gait and balance.  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   He feels like his balance and walking difficulty have worsened recently.  He reports he started falling frequently, he was catching his foot on things and tripping.  The most recent major fall was about 6 months ago.  He is being careful since then and trying to walk more to get stronger and trying to be more aware of his gait pattern.  He was walking with a walker until ~4 months ago, then started using a cane, and then has tried walking without any assistance.       He feels like his difficulty walking limits his ability to get out of the house as much as he used to because he is fearful of falling.  PLOF- he enjoys getting out of the house for social activities, going out to breakfast too; going grocery shopping a few times/week to get his groceries; prior to his back surgery he walked at the park in [place], hasn't been back though because he is worried about falling though.  He has a home aide that comes a few days a week to assist with household tasks as needed.  Also reports he had a MI a little over a week ago and was in the hospital (8/3-10/16/22)- he states he is referred to begin cardiac rehab as part of his outpatient recovery.  PERTINENT HISTORY: Extensive cardiac history History of lumbar spine surgery- 2022 L4-5 microdiscectomy/laminectomy   PAIN:  Are you having pain? R groin muscle area where he had the cardiac catheterization procedure last week.     PRECAUTIONS: Fall  RED FLAGS: Pt  with recent MI (10/13/22) with stent placement (see chart review)  WEIGHT BEARING RESTRICTIONS: No  FALLS:  Has patient fallen in last 6 months? Yes. Number of falls multiple  LIVING ENVIRONMENT: Lives with: lives alone (has a self reported "lady friend" for social support and enjoys being able to get out the house for community activities, going to restaurants together) Lives in: House/apartment Stairs:  he lives in a single level home with bonus room over the garage, but he does not need to go upstairs.1 step to enter in the garage- able to get in without falling. Has following equipment at home: Single point cane, Walker - 4 wheeled, and Wheelchair (manual) Has a home health aide that comes 2-3x/week if he needs assistance  OCCUPATION: retired  PLOF: Independent  PATIENT GOALS: to address drop foot and be able to manage it better; to strengthen his legs; and to increase his breathing; and improve his balance   NEXT MD VISIT: yes, next week    OBJECTIVE:   DIAGNOSTIC FINDINGS: no recent lower extremity/spine imaging  PATIENT SURVEYS:  FOTO 39/52  COGNITION: Overall cognitive status: Within functional limits for tasks assessed     SENSATION: Pt reports reduced sensation to light touch L L4-5 dermatomes  EDEMA:  No pitting edema noted on dorsum of foot   POSTURE: pt stands with wide base of support for balance; uses UE support on LE's for sit to stand transfer  PALPATION: (-) TTP distal LE b/l  LOWER EXTREMITY ROM:  Active ROM Right eval Left eval  Hip flexion WNL WNL  Hip extension    Hip abduction    Hip adduction    Hip internal rotation    Hip external rotation    Knee flexion 105 105  Knee extension -5 -5  Ankle dorsiflexion 5 deg PROM 0 deg  Ankle plantarflexion    Great toe extension 45 deg PROM 40 deg   Ankle eversion     (Blank rows = not tested)  LOWER EXTREMITY MMT:  MMT Right eval Left eval  Hip flexion 4 4  Hip extension    Hip abduction    Hip adduction    Hip internal rotation    Hip external rotation    Knee flexion 5 5  Knee extension 4 4  Ankle dorsiflexion 4/5 1/5  Ankle plantarflexion 3/5 3/5  Great toe extension 4/5 2/5  Ankle eversion 4/5 3/5   (Blank rows = not tested)   FUNCTIONAL TESTS:  OUTCOME MEASURES: TEST Outcome Interpretation  5 times sit<>stand 21 seconds >60 yo, >15 sec indicates increased risk for falls  10 meter walk test deferred <1.0 m/s indicates increased risk for falls; limited community ambulator  [employer] Assessment /56   deferred <36/56 (100% risk for falls), 37-45 (80% risk for falls); 46-51 (>50% risk for falls); 52-55 (lower risk <25% of falls)      GAIT: Distance walked: from waiting area into PT gym 50 ft, PT supervision Assistive device utilized: None Level of assistance: Complete Independence Comments: Pt ambulates with decreased gait speed, wide BOS, decreased stride length, excess L hip flexion strategy to compensate for L  foot drop  Pt not able to stand SLS on L without UE support today; able to stand R LE x 5 seconds  TODAY'S TREATMENT:  DATE: 01/23/23 Subjective:  Pt states he gets his AFO later this afternoon  Pain: 0/10  Objective: HR 70 SP02% 99  Therapeutic Exercises:  While wearing L AFO, no SPC during session, gait belt and PT supervision/SBA during standing exercises  Training for amb on uneven outdoor surfaces- incline/decline sidewalk; grass forward/reverse/lateral, curb situation navigation with [facility]- practice stepping up/down curb using SPC in R hand for scenarios where there may not be a railing; stepped down with L LE and pt prefers stepping up with L LE as SPC is in R hand. (5 min)  Walk 1 loop around the building with PT supervision/gait belt- used his SPC, focused on navigating uneven/ramp/inclined/declined sidewalk surfaces.  No major loss of balance. Time 5:50 today.  Sit to stand x 10, x10 with 8# weight held at chest; 2 sets from chair with airex cushion- no use of UE  Seated knee extension: 5# AW 2x20   Seated marches: 5# AW 2x20  Side stepping forward/laterally over airex balance beam in confined small space, without UE support- to facilitate using stepping strategy for balance in crowded spaces- 6 laps in parallel bars  Side stepping ON airex without UE support in parallel bars 4 laps  Weave in/out of cones on floor without SPC- PT supervision 4 laps to facilitate navigating obstacles in crowded room/space   Step ups onto 6 inch step: with 2 finger support ea UE; x6 lead with L LE, x7 leading with R LE (completed this via curb outdoors)   Not today: Standing alternating toe taps to green cones: sets of 12 alternating- PT supervision with gait belt- not today  Alternating step ups on airex: x6 lead with R and x6 lead with L- in parallel bars- not  today  PT SBA with gait belt: Not today High knee marches forward through blue ladder x 2 laps- not today  High knee lateral marches through ladder x 2 laps- not today  Diagonal ladder x 2 laps- not today  Reverse walking 4x 10 m- not today  Star drill: PT verbal cue for R or L and step/weight shift forward/lateral/diagonal outside base of support then back to midline- multiple directions/rounds x 4 min with PT close supervision/CGA- not today  Obstacle course in parallel bars: not today Step up/on/off airex, over 6 inch hurdle, cone toe taps R/L x5, step around cone in small space, step up/on/off airex- repeat opposite direction -Hurdles: forward direction in // bars (PT supervision with gait belt) 6 laps, focused on hip flexion strategy for foot clearance; attempted cue for "lift toes" but pt not able to actively perform active DF of ankle against gravity -Side step over hurdles in // bars: 6 laps -Airex: feet together, 3 trials x 30-40 seconds with removing hand support -Airex: tandem stance, 3 trials each R in front/L in front x 20-30 seconds removing hand support (pt notes ankle/lower leg fatigue after this)   PATIENT EDUCATION:  Education details: PT POC/goals, importance of attending cardiac rehab, activity pacing Person educated: Patient Education method: Explanation Education comprehension: verbalized understanding and needs further education  HOME EXERCISE PROGRAM: Access Code: [email or web address] URL: [email or web address] Date: 10/24/2022 Prepared by: Max Fickle  Exercises - Seated Heel Raise  - 1 x daily - 3 x weekly - 2 sets - 20 reps - Seated March  - 1 x daily - 3 x weekly - 2 sets - 10 reps - Seated Long Arc Quad  - 1 x daily - 3 x weekly - 2 sets - 10  reps  ASSESSMENT:  CLINICAL IMPRESSION: Pt is making progress towards goals with PT.  Focused tx today on standing dynamic balance activities that simulated stepping/navigating around obstacles in  crowded or confined space.  He required intermittent UE support on parallel bars to steady himself during exercises.  He would benefit from continuing to progress with dynamic balance/gait activities focusing on direction changes and navigating uneven surfaces and obstacles to improve his safety, improve his walking endurance, and reduce fear of falling while wearing his new AFO he expects to get this week.  Also would benefit from continuing to address LE strength deficits and improving walking endurance to be able to walk his dog around the block at home.  Patient will continue to benefit from skilled PT intervention to address listed impairments, to decrease fall risk and improve overall mobility and safety during ambulation.  Likely has not met maximum recovery with skilled PT.  Anticipate 1 additional month of tx to promote optimal long term outcome with PT.  OBJECTIVE IMPAIRMENTS: Abnormal gait, cardiopulmonary status limiting activity, decreased activity tolerance, decreased balance, decreased mobility, difficulty walking, decreased ROM, and decreased strength.   ACTIVITY LIMITATIONS: standing, squatting, stairs, transfers, and locomotion level  PARTICIPATION LIMITATIONS: meal prep, cleaning, laundry, interpersonal relationship, shopping, and community activity  PERSONAL FACTORS: Age, Past/current experiences, Time since onset of injury/illness/exacerbation, and cardiovascular hx including recent MI with stent placement last week  are also affecting patient's functional outcome.   REHAB POTENTIAL: Good  CLINICAL DECISION MAKING: Stable/uncomplicated  EVALUATION COMPLEXITY: Low   GOALS: Goals reviewed with patient? Yes  SHORT TERM GOALS: Target date: 11/11/22 Pt will be able to perform a HEP for LE strengthening >3x/week Baseline: 11/27/22 pt has been instructed on a HEP; 12/24/22: met Goal status: Met   LONG TERM GOALS: Target date: 02/18/23  Improve FOTO to >51 indicating pt able to  perform his daily activities without being limited by his balance; 12/24/22: in progress Baseline: 39; 10/7: 54; 01/21/23 51 Goal status: In Progress  2.  Pt will be able to amb with typical width BOS and b/l heel strike gait pattern using least restrictive AD and AFO on flat/incline/decline/grass surfaces x 10 min Baseline: not using any AD or AFO in clinic, pt wide BOS, no L heel strike due to foot drop; 11/27/22 pt is amb with SPC now, discussions continue regarding AFO/brace for improved safety; 12/24/22: pt now wearing AFO and using SPC on uneven surfaces with PT SBA x 5 minutes; 01/21/23: in progress- able to amb intervals of 5-6 min x 2 with 1-2 min sitting break between Goal status: In progress  3.  Pt will be able to perform 5x STS in <15 sec indicating reduced risk for falling Baseline: 21 sec, 11/27/22 19 sec (with mild UE use); 12/28/22: 18 seconds  (with mild UE support) 01/21/23:  19 seconds without UE support Goal status: In Progress  4. Pt will be able to amb x 1 block distance to walk his dog 1x/day.  Baseline: 1/4-1/2 block (pt self report), 01/21/23: amb 1 loop around building in 5.25 minutes at clinic  Goal status: In progress  5. Improve 10 M walk test to >1.0 m/s to promote reduced fall risk and improved community ambulator status  Baseline: 12/24/22 .9 m/s; 01/21/23: .8 m/s (at end of session, was fatigued)  Goal status: In progress  PLAN:  PT FREQUENCY: 2x/week  PT DURATION: 4 weeks  PLANNED INTERVENTIONS: Therapeutic exercises, Therapeutic activity, Neuromuscular re-education, Balance training, Gait training, Patient/Family  education, Self Care, Joint mobilization, and Orthotic/Fit training  PLAN FOR NEXT SESSION: continue with proximal hip and LE strengthening, multidirectional movements/dynamic balance activities, improving confidence/reducing fear of falling through activities in small spaces and that require increased reaction speed to unpredictable external  stimuli, gait training with his new AFO he gets later this week on even and uneven surfaces and working on improving walking endurance   Max Fickle, PT, DPT, OCS  Physical Therapist - [facility]   Ardine Bjork, PT 01/27/2023, 2:10 PM

## 2023-01-28 ENCOUNTER — Ambulatory Visit: Payer: Medicare HMO

## 2023-01-28 DIAGNOSIS — M6281 Muscle weakness (generalized): Secondary | ICD-10-CM | POA: Diagnosis not present

## 2023-01-28 DIAGNOSIS — R2689 Other abnormalities of gait and mobility: Secondary | ICD-10-CM

## 2023-01-28 NOTE — Therapy (Signed)
OUTPATIENT PHYSICAL THERAPY LOWER EXTREMITY TREATMENT Re-certification through 02/18/23   Patient Name: Devin [name] [facility]. MRN: [id number] DOB:April 15, 1945, 77 y.o., male Today's Date: 01/28/2023  END OF SESSION:  PT End of Session - 01/28/23 1248     Visit Number 24    Number of Visits 30    Date for PT Re-Evaluation 02/18/23    Authorization Type re-cert/PN done today 01/21/23 x 4 weeks (8 additional visits)    PT Start Time 1200    PT Stop Time 1245    PT Time Calculation (min) 45 min    Equipment Utilized During Treatment Gait belt    Activity Tolerance Patient tolerated treatment well    Behavior During Therapy WFL for tasks assessed/performed                 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   Hypertension    Ischemic cardiomyopathy    MI, old    Peripheral neuropathy    PFO (patent foramen ovale) 05/2019   s/p repair   Sleep apnea    Spinal stenosis of lumbar region    TIA (transient ischemic attack)    Past Surgical History:  Procedure Laterality Date   APPENDECTOMY     BREAST SURGERY Right    benign mass   CARDIOVERSION Right 12/15/2019   Procedure: CARDIOVERSION;  Surgeon: Laurier Nancy, MD;  Location: [facility] ORS;  Service: Cardiovascular;  Laterality: Right;   CATARACT EXTRACTION, BILATERAL     COLONOSCOPY     CORONARY ANGIOPLASTY WITH STENT PLACEMENT     CORONARY ARTERY BYPASS GRAFT  05/2019   LIMA-LAD   CORONARY STENT INTERVENTION N/A 10/15/2022   Procedure: CORONARY STENT INTERVENTION;  Surgeon: Marcina Millard, MD;  Location: [facility] IN[facility]IVE [facility];  Service: Cardiovascular;   Laterality: N/A;   LEFT HEART CATH N/A 10/15/2022   Procedure: Left Heart Cath;  Surgeon: Marcina Millard, MD;  Location: [facility];  Service: Cardiovascular;  Laterality: N/A;   LEFT HEART CATH AND CORS/GRAFTS ANGIOGRAPHY N/A 05/21/2019   Procedure: LEFT HEART CATH AND CORONARY ANGIOGRAPHY;  Surgeon: Laurier Nancy, MD;  Location: [facility] IN[facility] CV [facility];  Service: Cardiovascular;  Laterality: N/A;   LUMBAR LAMINECTOMY/DECOMPRESSION MICRODISCECTOMY N/A 07/04/2020   Procedure: L4-5 LAMINECTOMY;  Surgeon: Lucy Chris, MD;  Location: [facility] OR[facility];  Service: Neurosurgery;  Laterality: N/A;   REPLACEMENT TOTAL KNEE BILATERAL     Patient Active Problem List   Diagnosis Date Noted   NSTEMI (non-ST elevated myocardial infarction) (HCC) 10/13/2022   HFrEF (heart failure with reduced ejection fraction) (HCC) 10/13/2022   Degenerative joint disease (DJD) of lumbar spine 10/12/2022   Frequent falls 05/15/2022   Unsteady gait 04/16/2022   Acute delirium 04/13/2022   Electrolyte abnormality 04/13/2022   Nausea & vomiting 04/13/2022   PAD (peripheral artery disease) (HCC) 09/03/2021   Localized, primary osteoarthritis 11/24/2020   Wound healing, delayed 09/08/2020   History of lumbar surgery 09/08/2020   Chronic ischemic heart disease 07/13/2020   History of MI (myocardial infarction) 07/13/2020   History  of PTCA 1 07/13/2020   Other personal history presenting hazards to health 07/13/2020   Pure hypercholesterolemia 07/13/2020   Elevated lactic acid level 04/19/2020   Generalized weakness 04/09/2020   Fall at home 04/09/2020   Gastroenteritis 04/09/2020   AMS (altered mental status) 04/08/2020   Hypoxia 02/01/2020   Atrial fibrillation status post cardioversion (HCC) 12/22/2019   Congestive heart failure (CHF) (HCC) 12/13/2019   Hyponatremia 12/06/2019   Acute postoperative anemia due to expected blood loss 05/28/2019   Hyperlipidemia 05/28/2019   Ischemic cardiomyopathy 05/28/2019    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   Unstable angina (HCC) 05/24/2019   Chest pain 05/21/2019   Ischemic chest pain (HCC) 05/16/2019   CAD (coronary artery disease) 05/16/2019   HTN (hypertension) 05/16/2019   Depression 05/16/2019   GERD (gastroesophageal reflux disease) 05/16/2019   Verruca plantaris 01/23/2018   Obstructive sleep apnea syndrome 01/20/2018   Major depressive disorder, single episode, moderate (HCC) 07/05/2016   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 Zada Finders, MD  REFERRING PROVIDER: Dr. Zada Finders, MD/ [name], [facility]  REFERRING DIAG:  R26.9 (ICD-10-CM) - Unspecified abnormalities of gait and mobility  R26.89 (ICD-10-CM) - Other abnormalities of gait and mobility  R29.6 (ICD-10-CM) - Repeated falls    THERAPY DIAG:  Muscle weakness (generalized)  Balance problem  Rationale for Evaluation and Treatment: Rehabilitation  ONSET DATE: at least 2 years  SUBJECTIVE: (From initial evaluation note)  SUBJECTIVE STATEMENT: Pt reports he is having difficulty with gait and balance.  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   He feels like his balance and walking difficulty have worsened recently.  He reports he started falling frequently, he was catching his foot on things and tripping.  The most recent major fall was about 6 months ago.  He is being careful since then and trying to walk more to get stronger and trying to be more aware of his gait pattern.  He was walking with a walker until ~4 months ago, then started using a cane, and then has tried walking without any assistance.       He feels like his difficulty walking limits his ability to get out of the house as much as he used to because he is fearful of falling.  PLOF- he enjoys getting out of the house for social activities, going out to breakfast too; going grocery shopping a few times/week to get his groceries; prior to his back surgery he walked at the park in [place], hasn't been back though because he is worried about falling though.  He has a home aide that comes a few days a week to assist with household tasks as needed.  Also reports he had a MI a little over a week ago and was in the hospital (8/3-10/16/22)- he states he is referred to begin cardiac rehab as part of his outpatient recovery.  PERTINENT HISTORY: Extensive cardiac history History of lumbar spine surgery- 2022 L4-5 microdiscectomy/laminectomy   PAIN:  Are you having pain? R groin muscle area where he had the cardiac catheterization procedure last week.     PRECAUTIONS: Fall  RED FLAGS: Pt  with recent MI (10/13/22) with stent placement (see chart review)  WEIGHT BEARING RESTRICTIONS: No  FALLS:  Has patient fallen in last 6 months? Yes. Number of falls multiple  LIVING ENVIRONMENT: Lives with: lives alone (has a self reported "lady friend" for social support and enjoys being able to get out the house for community activities, going to restaurants together) Lives in: House/apartment Stairs:  he lives in a single level home with bonus room over the garage, but he does not need to go upstairs.1 step to enter in the garage- able to get in without falling. Has following equipment at home: Single point cane, Walker - 4 wheeled, and Wheelchair (manual) Has a home health aide that comes 2-3x/week if he needs assistance  OCCUPATION: retired  PLOF: Independent  PATIENT GOALS: to address drop foot and be able to manage it better; to strengthen his legs; and to increase his breathing; and improve his balance   NEXT MD VISIT: yes, next week    OBJECTIVE:   DIAGNOSTIC FINDINGS: no recent lower extremity/spine imaging  PATIENT SURVEYS:  FOTO 39/52  COGNITION: Overall cognitive status: Within functional limits for tasks assessed     SENSATION: Pt reports reduced sensation to light touch L L4-5 dermatomes  EDEMA:  No pitting edema noted on dorsum of foot   POSTURE: pt stands with wide base of support for balance; uses UE support on LE's for sit to stand transfer  PALPATION: (-) TTP distal LE b/l  LOWER EXTREMITY ROM:  Active ROM Right eval Left eval  Hip flexion WNL WNL  Hip extension    Hip abduction    Hip adduction    Hip internal rotation    Hip external rotation    Knee flexion 105 105  Knee extension -5 -5  Ankle dorsiflexion 5 deg PROM 0 deg  Ankle plantarflexion    Great toe extension 45 deg PROM 40 deg   Ankle eversion     (Blank rows = not tested)  LOWER EXTREMITY MMT:  MMT Right eval Left eval  Hip flexion 4 4  Hip extension    Hip abduction    Hip adduction    Hip internal rotation    Hip external rotation    Knee flexion 5 5  Knee extension 4 4  Ankle dorsiflexion 4/5 1/5  Ankle plantarflexion 3/5 3/5  Great toe extension 4/5 2/5  Ankle eversion 4/5 3/5   (Blank rows = not tested)   FUNCTIONAL TESTS:  OUTCOME MEASURES: TEST Outcome Interpretation  5 times sit<>stand 21 seconds >60 yo, >15 sec indicates increased risk for falls  10 meter walk test deferred <1.0 m/s indicates increased risk for falls; limited community ambulator  [employer] Assessment /56   deferred <36/56 (100% risk for falls), 37-45 (80% risk for falls); 46-51 (>50% risk for falls); 52-55 (lower risk <25% of falls)      GAIT: Distance walked: from waiting area into PT gym 50 ft, PT supervision Assistive device utilized: None Level of assistance: Complete Independence Comments: Pt ambulates with decreased gait speed, wide BOS, decreased stride length, excess L hip flexion strategy to compensate for L  foot drop  Pt not able to stand SLS on L without UE support today; able to stand R LE x 5 seconds  TODAY'S TREATMENT:  DATE: 01/28/23 Subjective:  Pt states he opted to not purchase the new AFO he was going to get because it was not comfortable on his foot.  He kept his previous one and got a new strap for it.  It feels more secure with the new strap.  He reports trying to work on the step up exercise on his step at home. Rode his airdyne x 7 min before PT.  Cardiac rehab is going well too.  Pain: 0/10  Objective: HR 70 SP02% 99  Therapeutic Exercises:  While wearing L AFO, no SPC during session, gait belt and PT supervision/SBA during standing exercises  Training for amb on uneven outdoor surfaces- incline/decline sidewalk; grass forward/reverse/lateral, curb situation navigation with [facility]- practice stepping up/down curb using SPC in R hand for scenarios where there may not be a railing; stepped down with L LE and pt prefers stepping up with L LE as SPC is in R hand. (5 min) Not today  Walk 1 loop around the building with PT supervision/gait belt- used his SPC, focused on navigating uneven/ramp/inclined/declined sidewalk surfaces.  No major loss of balance. Not today  Sit to stand x 10, x10 with 8# weight held at chest; 2 sets from chair with airex cushion- no use of UE  Side stepping forward/laterally/reverse over airex balance beam in confined small space, without UE support- to facilitate using stepping strategy for balance in crowded spaces- 6 laps in parallel bars  Side stepping ON airex without UE support in parallel bars 4 laps  Weave in/out of cones on floor without SPC- PT supervision 4 laps to facilitate navigating obstacles in crowded room/space   Step ups onto 6 inch step: with 2 finger support ea UE; x10 lead with L LE, x10 leading with R LE   Star  drill: PT verbal cue for R or L and step/weight shift forward/lateral/diagonal outside base of support then back to midline- multiple directions/rounds x 4 min with PT close supervision  Squat lift 5 cones from low surface (cones resting on 6 inch step, hand cones to PT then put cones back on the floor- 2 rounds  Not today: Standing alternating toe taps to green cones: sets of 12 alternating- PT supervision with gait belt- not today  Alternating step ups on airex: x6 lead with R and x6 lead with L- in parallel bars- not today  PT SBA with gait belt: Not today High knee marches forward through blue ladder x 2 laps- not today  High knee lateral marches through ladder x 2 laps- not today  Diagonal ladder x 2 laps- not today  Reverse walking 4x 10 m- not today  Obstacle course in parallel bars: not today Step up/on/off airex, over 6 inch hurdle, cone toe taps R/L x5, step around cone in small space, step up/on/off airex- repeat opposite direction -Hurdles: forward direction in // bars (PT supervision with gait belt) 6 laps, focused on hip flexion strategy for foot clearance; attempted cue for "lift toes" but pt not able to actively perform active DF of ankle against gravity -Side step over hurdles in // bars: 6 laps -Airex: feet together, 3 trials x 30-40 seconds with removing hand support -Airex: tandem stance, 3 trials each R in front/L in front x 20-30 seconds removing hand support (pt notes ankle/lower leg fatigue after this) -Seated knee extension: 5# AW 2x20  -Seated marches: 5# AW 2x20  PATIENT EDUCATION:  Education details: PT POC/goals, importance of attending cardiac rehab, activity pacing Person educated:  Patient Education method: Explanation Education comprehension: verbalized understanding and needs further education  HOME EXERCISE PROGRAM: Access Code: [email or web address] URL: [email or web address] Date: 10/24/2022 Prepared by: Max Fickle  Exercises - Seated  Heel Raise  - 1 x daily - 3 x weekly - 2 sets - 20 reps - Seated March  - 1 x daily - 3 x weekly - 2 sets - 10 reps - Seated Long Arc Quad  - 1 x daily - 3 x weekly - 2 sets - 10 reps  ASSESSMENT:  CLINICAL IMPRESSION: Focused tx today on standing dynamic balance activities that simulated stepping/navigating around obstacles in crowded or confined space.  Also incorporated reaching for items on floor as pt notes he has difficulty with this at home, he lacks confidence with his balance to get something if it falls on the floor.  No major loss of balance with this functional activity today.  He would benefit from continuing to progress with dynamic balance/gait activities focusing on direction changes and navigating uneven surfaces and obstacles to improve his safety, improve his walking endurance, and reduce fear of falling.  Patient will continue to benefit from skilled PT intervention to address listed impairments, to decrease fall risk and improve overall mobility and safety during ambulation.  Likely has not met maximum recovery with skilled PT.  Anticipate 1 additional month of tx to promote optimal long term outcome with PT.  OBJECTIVE IMPAIRMENTS: Abnormal gait, cardiopulmonary status limiting activity, decreased activity tolerance, decreased balance, decreased mobility, difficulty walking, decreased ROM, and decreased strength.   ACTIVITY LIMITATIONS: standing, squatting, stairs, transfers, and locomotion level  PARTICIPATION LIMITATIONS: meal prep, cleaning, laundry, interpersonal relationship, shopping, and community activity  PERSONAL FACTORS: Age, Past/current experiences, Time since onset of injury/illness/exacerbation, and cardiovascular hx including recent MI with stent placement last week  are also affecting patient's functional outcome.   REHAB POTENTIAL: Good  CLINICAL DECISION MAKING: Stable/uncomplicated  EVALUATION COMPLEXITY: Low   GOALS: Goals reviewed with patient?  Yes  SHORT TERM GOALS: Target date: 11/11/22 Pt will be able to perform a HEP for LE strengthening >3x/week Baseline: 11/27/22 pt has been instructed on a HEP; 12/24/22: met Goal status: Met   LONG TERM GOALS: Target date: 02/18/23  Improve FOTO to >51 indicating pt able to perform his daily activities without being limited by his balance; 12/24/22: in progress Baseline: 39; 10/7: 54; 01/21/23 51 Goal status: In Progress  2.  Pt will be able to amb with typical width BOS and b/l heel strike gait pattern using least restrictive AD and AFO on flat/incline/decline/grass surfaces x 10 min Baseline: not using any AD or AFO in clinic, pt wide BOS, no L heel strike due to foot drop; 11/27/22 pt is amb with SPC now, discussions continue regarding AFO/brace for improved safety; 12/24/22: pt now wearing AFO and using SPC on uneven surfaces with PT SBA x 5 minutes; 01/21/23: in progress- able to amb intervals of 5-6 min x 2 with 1-2 min sitting break between Goal status: In progress  3.  Pt will be able to perform 5x STS in <15 sec indicating reduced risk for falling Baseline: 21 sec, 11/27/22 19 sec (with mild UE use); 12/28/22: 18 seconds  (with mild UE support) 01/21/23:  19 seconds without UE support Goal status: In Progress  4. Pt will be able to amb x 1 block distance to walk his dog 1x/day.  Baseline: 1/4-1/2 block (pt self report), 01/21/23: amb 1 loop around building in 5.25 minutes at clinic  Goal status: In progress  5. Improve 10 M walk test to >1.0 m/s to promote reduced fall risk and improved community ambulator status  Baseline: 12/24/22 .9 m/s; 01/21/23: .8 m/s (at end of session, was fatigued)  Goal status: In progress  PLAN:  PT FREQUENCY: 2x/week  PT DURATION: 4 weeks  PLANNED INTERVENTIONS: Therapeutic exercises, Therapeutic activity, Neuromuscular re-education, Balance training, Gait training, Patient/Family education, Self Care, Joint mobilization, and Orthotic/Fit  training  PLAN FOR NEXT SESSION: continue with proximal hip and LE strengthening, multidirectional movements/dynamic balance activities, improving confidence/reducing fear of falling through activities in small spaces and that require increased reaction speed to unpredictable external stimuli, gait training with his new AFO he gets later this week on even and uneven surfaces and working on improving walking endurance   Max Fickle, PT, DPT, OCS  Physical Therapist - [facility]   Ardine Bjork, PT 01/28/2023, 1:16 PM

## 2023-01-30 ENCOUNTER — Ambulatory Visit: Payer: Medicare HMO

## 2023-01-30 ENCOUNTER — Encounter: Payer: Self-pay | Admitting: *Deleted

## 2023-01-30 DIAGNOSIS — I214 Non-ST elevation (NSTEMI) myocardial infarction: Secondary | ICD-10-CM

## 2023-01-30 DIAGNOSIS — Z955 Presence of coronary angioplasty implant and graft: Secondary | ICD-10-CM

## 2023-01-30 NOTE — Progress Notes (Unsigned)
Cardiac Individual Treatment Plan  Patient Details  Name: Devin [name] [facility]. MRN: [id number] Date of Birth: 1945-05-22 Referring Provider:   Flowsheet Row Cardiac Rehab from 11/19/2022 in [facility] Cardiac and Pulmonary Rehab  Referring Provider Dr. Adrian Blackwater MD       Initial Encounter Date:  Flowsheet Row Cardiac Rehab from 11/19/2022 in [facility] Cardiac and Pulmonary Rehab  Date 11/19/22       Visit Diagnosis: NSTEMI (non-ST elevation myocardial infarction) [facility])  Status post coronary artery stent placement  Patient's Home Medications on Admission:  Current Outpatient Medications:    albuterol (VENTOLIN HFA) 108 (90 Base) MCG/ACT inhaler, Inhale 2 puffs into the lungs every 6 (six) hours as needed for wheezing or shortness of breath., Disp: , Rfl:    amiodarone (PACERONE) 200 MG tablet, Take 200 mg by mouth daily., Disp: , Rfl:    amLODipine (NORVASC) 10 MG tablet, Take 1 tablet by mouth daily., Disp: , Rfl:    apixaban (ELIQUIS) 5 MG TABS tablet, Take 1 tablet (5 mg total) by mouth 2 (two) times daily., Disp: 60 tablet, Rfl: 3   atorvastatin (LIPITOR) 80 MG tablet, Take 80 mg by mouth daily., Disp: , Rfl:    buPROPion (WELLBUTRIN XL) 300 MG 24 hr tablet, Take 300 mg by mouth daily., Disp: , Rfl:    cephALEXin (KEFLEX) 500 MG capsule, Take 500 mg by mouth 3 (three) times daily., Disp: , Rfl:    clopidogrel (PLAVIX) 75 MG tablet, Take 1 tablet (75 mg total) by mouth daily with breakfast., Disp: 30 tablet, Rfl: 11   docusate (COLACE) 60 MG/15ML syrup, Take 60 mg by mouth daily., Disp: , Rfl:    EPINEPHrine 0.3 mg/0.3 mL IJ SOAJ injection, Inject 0.3 mg into the muscle as needed for anaphylaxis., Disp: , Rfl:    ezetimibe (ZETIA) 10 MG tablet, Take 10 mg by mouth daily., Disp: , Rfl:    fluticasone (FLONASE) 50 MCG/ACT nasal spray, SPRAY 2 SPRAYS INTO EACH NOSTRIL EVERY DAY, Disp: , Rfl:    gabapentin (NEURONTIN) 300 MG capsule, Take 300 mg by mouth 2 (two) times daily., Disp: , Rfl:     loratadine (CLARITIN) 10 MG tablet, Take 10 mg by mouth daily., Disp: , Rfl:    metFORMIN (GLUCOPHAGE) 500 MG tablet, Take 1,000 mg by mouth 2 (two) times daily., Disp: , Rfl:    metoprolol succinate (TOPROL XL) 50 MG 24 hr tablet, Take 1 tablet (50 mg total) by mouth daily. Take with or immediately following a meal., Disp: 30 tablet, Rfl: 11   nitroGLYCERIN (NITROSTAT) 0.4 MG SL tablet, Place 0.4 mg under the tongue every 5 (five) minutes as needed for chest pain., Disp: , Rfl:    pantoprazole (PROTONIX) 40 MG tablet, Take 1 tablet (40 mg total) by mouth daily., Disp: 30 tablet, Rfl: 1   sacubitril-valsartan (ENTRESTO) 97-103 MG, TAKE 1 TABLET BY MOUTH TWICE A DAY, Disp: 180 tablet, Rfl: 1  Past Medical History: 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   Hypertension    Ischemic cardiomyopathy  MI, old    Peripheral neuropathy    PFO (patent foramen ovale) 05/2019   s/p repair   Sleep apnea    Spinal stenosis of lumbar region    TIA (transient ischemic attack)     Tobacco Use: Social History   Tobacco Use  Smoking Status Former  Smokeless Tobacco Never  Tobacco Comments   Quit over 40 years ago    Labs: Review Flowsheet  More data exists      Latest Ref Rng & Units 04/08/2020 07/04/2020 04/12/2022 04/13/2022 10/13/2022  Labs for ITP Cardiac and Pulmonary Rehab  Cholestrol 0 - 200 mg/dL - - - - 865   LDL (calc) 0 - 99 mg/dL - - - - 35   HDL-C >78 mg/dL - - - - 59   Trlycerides <150 mg/dL - - - - 55   Hemoglobin A1c 4.8 - 5.6 % 5.8  - - 5.5  6.3   Bicarbonate 20.0 - 28.0 mmol/L - - 23.9  - -  TCO2 22 - 32 mmol/L - 24  - -  -  Acid-base deficit 0.0 - 2.0 mmol/L - - 0.1  - -  O2 Saturation % - - 90.3  - -    Details             Exercise Target Goals: Exercise Program Goal: Individual exercise prescription set using results from initial 6 min walk test and THRR while considering  patient's activity barriers and safety.   Exercise Prescription Goal: Initial exercise prescription builds to 30-45 minutes a day of aerobic activity, 2-3 days per week.  Home exercise guidelines will be given to patient during program as part of exercise prescription that the participant will acknowledge.   Education: Aerobic Exercise: - Group verbal and visual presentation on the components of exercise prescription. Introduces F.I.T.T principle from ACSM for exercise prescriptions.  Reviews F.I.T.T. principles of aerobic exercise including progression. Written material given at graduation. Flowsheet Row Cardiac Rehab from 11/19/2022 in [facility] Cardiac and Pulmonary Rehab  Education need identified 11/19/22       Education: Resistance Exercise: - Group verbal and visual presentation on the components of exercise prescription. Introduces F.I.T.T principle from ACSM for exercise prescriptions  Reviews F.I.T.T. principles of resistance exercise including progression. Written material given at graduation.    Education: Exercise & Equipment Safety: - Individual verbal instruction and demonstration of equipment use and safety with use of the equipment. Flowsheet Row Cardiac Rehab from 11/19/2022 in [facility] Cardiac and Pulmonary Rehab  Date 11/19/22  Educator NT  Instruction Review Code 1- Verbalizes Understanding       Education: Exercise Physiology & General Exercise Guidelines: - Group verbal and written instruction with models to review the exercise physiology of the cardiovascular system and associated critical values. Provides general exercise guidelines with specific guidelines to those with heart or lung disease.  Flowsheet  Row Cardiac Rehab from 10/07/2019 in [facility] Cardiac and Pulmonary Rehab  Date 09/23/19  Educator AS  Instruction Review Code 1- Verbalizes Understanding       Education: Flexibility, Balance, Mind/Body Relaxation: - Group verbal and visual presentation with interactive activity on the components of exercise prescription. Introduces F.I.T.T principle from ACSM for exercise prescriptions. Reviews F.I.T.T. principles of flexibility and balance exercise training including progression. Also discusses the mind body connection.  Reviews various relaxation techniques to help reduce and manage stress (i.e. Deep breathing, progressive muscle relaxation, and visualization). Balance handout provided to take home. Written material given at graduation.  Activity Barriers & Risk Stratification:  Activity Barriers & Cardiac Risk Stratification - 11/19/22 1442       Activity Barriers & Cardiac Risk Stratification   Activity Barriers Right Knee Replacement;Left Knee Replacement;Muscular Weakness;Balance Concerns;Other (comment)    Comments drop foot, impaired gait, neuropathy    Cardiac Risk Stratification Moderate             6 Minute Walk:  6 Minute Walk     Row Name 11/19/22 1440         6 Minute Walk   Phase Initial     Distance 620 feet     Walk Time 6 minutes     # of Rest Breaks 0     MPH 1.17     METS 1.02     RPE 11     Perceived Dyspnea  0     VO2 Peak 3.59     Symptoms No     Resting HR 56 bpm     Resting BP 128/68     Resting Oxygen Saturation  97 %     Exercise Oxygen Saturation  during 6 min walk 96 %     Max Ex. HR 68 bpm     Max Ex. BP 138/76     2 Minute Post BP 136/70              Oxygen Initial Assessment:   Oxygen Re-Evaluation:   Oxygen Discharge (Final Oxygen Re-Evaluation):   Initial Exercise Prescription:  Initial Exercise Prescription - 11/19/22 1400       Date of Initial Exercise RX and Referring Provider   Date 11/19/22    Referring  Provider Dr. Adrian Blackwater MD      Oxygen   Maintain Oxygen Saturation 88% or higher      Treadmill   MPH 1    Grade 0    Minutes 15    METs 1.8      NuStep   Level 1    SPM 80    Minutes 15    METs 1.02      Arm Ergometer   Level 1    RPM 50    Minutes 15    METs 1.02      Track   Laps 16    Minutes 15    METs 1.87      Prescription Details   Frequency (times per week) 2    Duration Progress to 30 minutes of continuous aerobic without signs/symptoms of physical distress      Intensity   THRR 40-80% of Max Heartrate 90-124    Ratings of Perceived Exertion 11-13    Perceived Dyspnea 0-4      Progression   Progression Continue to progress workloads to maintain intensity without signs/symptoms of physical distress.      Resistance Training   Training Prescription Yes    Weight 4 lb    Reps 10-15             Perform Capillary Blood Glucose checks as needed.  Exercise Prescription Changes:   Exercise Prescription Changes     Row Name 11/19/22 1400 12/13/22 0800 12/26/22 0800 01/09/23 0900 01/24/23 1400     Response to Exercise   Blood Pressure (Admit) 128/68 126/58 108/56 122/60 112/58   Blood Pressure (Exercise) 138/76 152/66 142/70 144/64 --   Blood Pressure (Exit) 136/70 124/68 108/60 118/60 124/64   Heart Rate (Admit) 56 bpm 69 bpm 74 bpm 74 bpm 104 bpm  Heart Rate (Exercise) 68 bpm 104 bpm 95 bpm 91 bpm 108 bpm   Heart Rate (Exit) 60 bpm 69 bpm 68 bpm 83 bpm 95 bpm   Oxygen Saturation (Admit) 97 % -- -- -- --   Oxygen Saturation (Exercise) 96 % -- -- -- --   Rating of Perceived Exertion (Exercise) 11 13 15 13 13    Perceived Dyspnea (Exercise) 0 -- -- -- 0   Symptoms none none none none none   Comments Results First two weeks of exercise -- -- --   Duration -- Continue with 30 min of aerobic exercise without signs/symptoms of physical distress. Continue with 30 min of aerobic exercise without signs/symptoms of physical distress. Continue  with 30 min of aerobic exercise without signs/symptoms of physical distress. Continue with 30 min of aerobic exercise without signs/symptoms of physical distress.   Intensity -- THRR unchanged THRR unchanged THRR unchanged THRR unchanged     Progression   Progression -- Continue to progress workloads to maintain intensity without signs/symptoms of physical distress. Continue to progress workloads to maintain intensity without signs/symptoms of physical distress. Continue to progress workloads to maintain intensity without signs/symptoms of physical distress. Continue to progress workloads to maintain intensity without signs/symptoms of physical distress.   Average METs -- 2.29 2.09 2.53 --     Resistance Training   Training Prescription -- Yes Yes Yes Yes   Weight -- none  Pt's doctor did not give clearance for resistance training none  Pt's doctor did not give clearance for resistance training none  Pt's doctor did not give clearance for resistance training none  Pt's doctor did not give clearance for resistance training   Reps -- 10-15 10-15 10-15 10-15     Interval Training   Interval Training -- No No No No     Treadmill   MPH -- 1.7 1.2 -- --   Grade -- 0 0 -- --   Minutes -- 15 15 -- --   METs -- 2.3 1.92 -- --     NuStep   Level -- 4 4 4  --   Minutes -- 15 15 30  --   METs -- 3.1 2.7 3 --     Arm Ergometer   Level -- -- 1 -- --   Minutes -- -- 15 -- --   METs -- -- 1 -- --     T5 Nustep   Level -- -- -- 1 4   Minutes -- -- -- 15 15     Biostep-RELP   Level -- 1 -- -- --   Minutes -- 15 -- -- --   METs -- 2 -- -- --     Track   Laps -- 16 25 10 10    Minutes -- 15 15 15 15    METs -- 1.87 2.36 1.54 --     Oxygen   Maintain Oxygen Saturation -- 88% or higher 88% or higher 88% or higher 88% or higher            Exercise Comments:   Exercise Comments     Row Name 11/27/22 0941           Exercise Comments First full day of exercise!  Patient was oriented  to gym and equipment including functions, settings, policies, and procedures.  Patient's individual exercise prescription and treatment plan were reviewed.  All starting workloads were established based on the results of the 6 minute walk test done at initial orientation visit.  The plan  for exercise progression was also introduced and progression will be customized based on patient's performance and goals.  AAA parameters  set by his physician explained to patient.                Exercise Goals and Review:   Exercise Goals     Row Name 11/19/22 1442             Exercise Goals   Increase Physical Activity Yes       Intervention Provide advice, education, support and counseling about physical activity/exercise needs.;Develop an individualized exercise prescription for aerobic and resistive training based on initial evaluation findings, risk stratification, comorbidities and participant's personal goals.       Expected Outcomes Long Term: Add in home exercise to make exercise part of routine and to increase amount of physical activity.;Long Term: Exercising regularly at least 3-5 days a week.;Short Term: Attend rehab on a regular basis to increase amount of physical activity.       Increase Strength and Stamina Yes       Intervention Provide advice, education, support and counseling about physical activity/exercise needs.;Develop an individualized exercise prescription for aerobic and resistive training based on initial evaluation findings, risk stratification, comorbidities and participant's personal goals.       Expected Outcomes Short Term: Increase workloads from initial exercise prescription for resistance, speed, and METs.;Short Term: Perform resistance training exercises routinely during rehab and add in resistance training at home;Long Term: Improve cardiorespiratory fitness, muscular endurance and strength as measured by increased METs and functional capacity ( )       Able to  understand and use rate of perceived exertion (RPE) scale Yes       Intervention Provide education and explanation on how to use RPE scale       Expected Outcomes Short Term: Able to use RPE daily in rehab to express subjective intensity level;Long Term:  Able to use RPE to guide intensity level when exercising independently       Able to understand and use Dyspnea scale Yes       Intervention Provide education and explanation on how to use Dyspnea scale       Expected Outcomes Short Term: Able to use Dyspnea scale daily in rehab to express subjective sense of shortness of breath during exertion;Long Term: Able to use Dyspnea scale to guide intensity level when exercising independently       Knowledge and understanding of Target Heart Rate Range (THRR) Yes       Intervention Provide education and explanation of THRR including how the numbers were predicted and where they are located for reference       Expected Outcomes Short Term: Able to state/look up THRR;Short Term: Able to use daily as guideline for intensity in rehab;Long Term: Able to use THRR to govern intensity when exercising independently       Able to check pulse independently Yes       Intervention Provide education and demonstration on how to check pulse in carotid and radial arteries.;Review the importance of being able to check your own pulse for safety during independent exercise       Expected Outcomes Short Term: Able to explain why pulse checking is important during independent exercise;Long Term: Able to check pulse independently and accurately       Understanding of Exercise Prescription Yes       Intervention Provide education, explanation, and written materials on patient's individual exercise prescription  Expected Outcomes Short Term: Able to explain program exercise prescription;Long Term: Able to explain home exercise prescription to exercise independently                Exercise Goals Re-Evaluation :  Exercise  Goals Re-Evaluation     Row Name 11/27/22 0941 12/13/22 0818 12/26/22 0804 01/09/23 0941 01/15/23 0942     Exercise Goal Re-Evaluation   Exercise Goals Review Able to understand and use rate of perceived exertion (RPE) scale;Knowledge and understanding of Target Heart Rate Range (THRR);Understanding of Exercise Prescription;Able to understand and use Dyspnea scale Increase Physical Activity;Increase Strength and Stamina;Understanding of Exercise Prescription Increase Physical Activity;Increase Strength and Stamina;Understanding of Exercise Prescription Increase Physical Activity;Increase Strength and Stamina;Understanding of Exercise Prescription Increase Physical Activity;Increase Strength and Stamina;Understanding of Exercise Prescription   Comments Reviewed RPE  and dyspnea scale, THR and program prescription with pt today.  Pt voiced understanding and was given a copy of goals to take home. Ed is off to a good start in the program. He has done well on the treadmill so far, as he increased his workload up to 1.7 mph with no incline. He also has done well at level one on the biostep and improved to level 4 on the T4 nustep. He has not any done any resistance training at this time due to not receiving clearance from his doctor. We will continue to monitor his progress in the program. Ed is doing well in rehab. He has continued to walk the track and increased from 16 laps to 25 laps! He also has has continued to do well at level 4 on the T4 nustep and level 1 on the arm ergometer. He has not done any resistance training at this time due to doctor's restrictions. We will continue to monitor his progress in the program. Ed is doing well in rehab. He has only walked the track once since the last review and was able to do 10 laps. He also continues to work at level 4 on the T4 nustep but was able to exercise for 30 minutes. He also began using the T5 nustep at level 1. We will continue to monitor his progress in  the program. He is doing well at reahb on level 4 on T4 and T5. He goes to PT 2 times per week. Otherwise his exercise at home is limited. Encouraged him to continue to work on strength and balance.   Expected Outcomes Short: Use RPE daily to regulate intensity. Long: Follow program prescription in THR. Short:Continue to follow current exercise prescription. Long: Continue exercise to improve strength and stamina. Short: Increase workload on arm ergometer. Long: Continue exercise to improve strength and stamina. Short: Increase laps on the track back up to previous number. Long: Continue exercise to improve strength and stamina. Short: walk more at home, imporve strength and balance. Long: Continue to work on Adult nurse.    Row Name 01/24/23 1448             Exercise Goal Re-Evaluation   Exercise Goals Review Increase Physical Activity;Increase Strength and Stamina;Understanding of Exercise Prescription       Comments Ed continues to do well in rehab. He was recently able to increase his level on the T5 nustep from level 1 to 4. He was also able to maintain 10 track laps in 15 minutes. We will continue to monitor his progress in the program.       Expected Outcomes Short: Increase track laps, return to using  the T4 nustep. Long: Continue exercise to improve strength and stamina.                Discharge Exercise Prescription (Final Exercise Prescription Changes):  Exercise Prescription Changes - 01/24/23 1400       Response to Exercise   Blood Pressure (Admit) 112/58    Blood Pressure (Exit) 124/64    Heart Rate (Admit) 104 bpm    Heart Rate (Exercise) 108 bpm    Heart Rate (Exit) 95 bpm    Rating of Perceived Exertion (Exercise) 13    Perceived Dyspnea (Exercise) 0    Symptoms none    Duration Continue with 30 min of aerobic exercise without signs/symptoms of physical distress.    Intensity THRR unchanged      Progression   Progression Continue to progress workloads to  maintain intensity without signs/symptoms of physical distress.      Resistance Training   Training Prescription Yes    Weight none   Pt's doctor did not give clearance for resistance training   Reps 10-15      Interval Training   Interval Training No      T5 Nustep   Level 4    Minutes 15      Track   Laps 10    Minutes 15      Oxygen   Maintain Oxygen Saturation 88% or higher             Nutrition:  Target Goals: Understanding of nutrition guidelines, daily intake of sodium 1500mg , cholesterol 200mg , calories 30% from fat and 7% or less from saturated fats, daily to have 5 or more servings of fruits and vegetables.  Education: All About Nutrition: -Group instruction provided by verbal, written material, interactive activities, discussions, models, and posters to present general guidelines for heart healthy nutrition including fat, fiber, MyPlate, the role of sodium in heart healthy nutrition, utilization of the nutrition label, and utilization of this knowledge for meal planning. Follow up email sent as well. Written material given at graduation. Flowsheet Row Cardiac Rehab from 11/19/2022 in [facility] Cardiac and Pulmonary Rehab  Education need identified 11/19/22       Biometrics:  Pre Biometrics - 11/19/22 1443       Pre Biometrics   Height 5' 11.5" (1.816 m)    Weight 205 lb 3.2 oz (93.1 kg)    Waist Circumference 43 inches    Hip Circumference 42 inches    Waist to Hip Ratio 1.02 %    BMI (Calculated) 28.22    Single Leg Stand 1.2 seconds              Nutrition Therapy Plan and Nutrition Goals:  Nutrition Therapy & Goals - 11/27/22 1131       Nutrition Therapy   Diet Carb controlled, Cardiac, low na    Protein (specify units) 90    Fiber 30 grams    Whole Grain Foods 3 servings    Saturated Fats 15 max. grams    Fruits and Vegetables 5 servings/day    Sodium 2 grams      Personal Nutrition Goals   Nutrition Goal Eat a protein at every meal and  pair it with a carb    Personal Goal #2 Use protein snacks if needed    Personal Goal #3 Use sweets smartly, and make good snack choices    Comments patient drinking >64oz of water most days. Rarely drinks sugary drinks. He eats 3 meals per day.  Has veteran in-home assistance to help him make meals. Likes to go out to restaurants for social interaction, but tries to make good food choices. Reviewed mediterranean diet handout. Educated on types of fats, sources, and how to read labels. He reports he doesn't use salt at home, commended him and educated about sodium limits, how processed foods can have lots in them before arriving to the table. Reviewed his 24hr food recall, encouraged more protein. Recommended greek yogurt, cottage cheese, hard boiled eggs, tuna, chicken, premier protein shakes as ways to boost protein intake. Build out several meals and snacks with foods he likes and will eat, focusing on adequate protein intake, healthy fats and colorful plates with controlled portions of carbs.      Intervention Plan   Intervention Prescribe, educate and counsel regarding individualized specific dietary modifications aiming towards targeted core components such as weight, hypertension, lipid management, diabetes, heart failure and other comorbidities.;Nutrition handout(s) given to patient.    Expected Outcomes Long Term Goal: Adherence to prescribed nutrition plan.;Short Term Goal: A plan has been developed with personal nutrition goals set during dietitian appointment.;Short Term Goal: Understand basic principles of dietary content, such as calories, fat, sodium, cholesterol and nutrients.             Nutrition Assessments:  MEDIFICTS Score Key: >=70 Need to make dietary changes  40-70 Heart Healthy Diet <= 40 Therapeutic Level Cholesterol Diet  Flowsheet Row Cardiac Rehab from 11/19/2022 in [facility] Cardiac and Pulmonary Rehab  Picture Your Plate Total Score on Admission 72      Picture Your  Plate Scores: <16 Unhealthy dietary pattern with much room for improvement. 41-50 Dietary pattern unlikely to meet recommendations for good health and room for improvement. 51-60 More healthful dietary pattern, with some room for improvement.  >60 Healthy dietary pattern, although there may be some specific behaviors that could be improved.    Nutrition Goals Re-Evaluation:  Nutrition Goals Re-Evaluation     Row Name 01/15/23 0945             Goals   Comment He reports he is doing well with his foods, including more veggies and lean meats. Cutting back on fatty beef and fried foods. He reports he is snacking less and choosing more colorful produce.       Expected Outcome Short: continue to limit fatty meats and sugary beverages. Long: Work towards maintain heart healthy diet                Nutrition Goals Discharge (Final Nutrition Goals Re-Evaluation):  Nutrition Goals Re-Evaluation - 01/15/23 0945       Goals   Comment He reports he is doing well with his foods, including more veggies and lean meats. Cutting back on fatty beef and fried foods. He reports he is snacking less and choosing more colorful produce.    Expected Outcome Short: continue to limit fatty meats and sugary beverages. Long: Work towards maintain heart healthy diet             Psychosocial: Target Goals: Acknowledge presence or absence of significant depression and/or stress, maximize coping skills, provide positive support system. Participant is able to verbalize types and ability to use techniques and skills needed for reducing stress and depression.   Education: Stress, Anxiety, and Depression - Group verbal and visual presentation to define topics covered.  Reviews how body is impacted by stress, anxiety, and depression.  Also discusses healthy ways to reduce stress and to treat/manage anxiety and  depression.  Written material given at graduation.   Education: Sleep Hygiene -Provides group  verbal and written instruction about how sleep can affect your health.  Define sleep hygiene, discuss sleep cycles and impact of sleep habits. Review good sleep hygiene tips.    Initial Review & Psychosocial Screening:  Initial Psych Review & Screening - 11/07/22 1003       Initial Review   Current issues with None Identified      Family Dynamics   Good Support System? Yes   lady friend     Barriers   Psychosocial barriers to participate in program There are no identifiable barriers or psychosocial needs.      Screening Interventions   Interventions Encouraged to exercise;To provide support and resources with identified psychosocial needs;Provide feedback about the scores to participant    Expected Outcomes Short Term goal: Utilizing psychosocial counselor, staff and physician to assist with identification of specific Stressors or current issues interfering with healing process. Setting desired goal for each stressor or current issue identified.;Long Term Goal: Stressors or current issues are controlled or eliminated.;Short Term goal: Identification and review with participant of any Quality of Life or Depression concerns found by scoring the questionnaire.;Long Term goal: The participant improves quality of Life and PHQ9 Scores as seen by post scores and/or verbalization of changes             Quality of Life Scores:   Quality of Life - 11/19/22 1434       Quality of Life   Select Quality of Life      Quality of Life Scores   Health/Function Pre 22.37 %    Socioeconomic Pre 24.07 %    Psych/Spiritual Pre 22 %    Family Pre 30 %    GLOBAL Pre 23.2 %            Scores of 19 and below usually indicate a poorer quality of life in these areas.  A difference of  2-3 points is a clinically meaningful difference.  A difference of 2-3 points in the total score of the Quality of Life Index has been associated with significant improvement in overall quality of life, self-image,  physical symptoms, and general health in studies assessing change in quality of life.  PHQ-9: Review Flowsheet  More data may exist      11/19/2022 01/12/2020 10/28/2019 09/09/2019 07/02/2019  Depression screen PHQ 2/9  Decreased Interest 1 3 2 1 2   Down, Depressed, Hopeless 1 0 0 0 1  PHQ - 2 Score 2 3 2 1 3   Altered sleeping 1 1 3 3 3   Tired, decreased energy 1 2 2  0 2  Change in appetite 1 1 1  0 1  Feeling bad or failure about yourself  0 0 0 0 0  Trouble concentrating 0 0 1 0 0  Moving slowly or fidgety/restless 0 0 1 0 0  Suicidal thoughts 0 1 0 0 0  PHQ-9 Score 5 8 10 4 9   Difficult doing work/chores Somewhat difficult Not difficult at all Somewhat difficult Not difficult at all Not difficult at all    Details           Interpretation of Total Score  Total Score Depression Severity:  1-4 = Minimal depression, 5-9 = Mild depression, 10-14 = Moderate depression, 15-19 = Moderately severe depression, 20-27 = Severe depression   Psychosocial Evaluation and Intervention:  Psychosocial Evaluation - 11/07/22 1020       Psychosocial Evaluation &  Interventions   Comments Ed has no barriers to attending the prgram. He has been in program before and wants to work on his leg strength and his balance. He is in PT at this time for balance concerns. He states no concerns with stress. He is working with his physician over his medication. He is ready to start    Expected Outcomes STGAttend all scheduled sessions, work on exercise progression while concentrating on leg strength and improved balance LTG Continues working on exercie progression after discharge    Continue Psychosocial Services  Follow up required by staff             Psychosocial Re-Evaluation:  Psychosocial Re-Evaluation     Row Name 01/15/23 [phone]             Psychosocial Re-Evaluation   Current issues with Current Sleep Concerns       Comments Reports his sleep has not been good, taking medication and says it  helps him. Otherwise he reports no stress or anxiety or depression. Reports he is complinat with his anti-depressant medication       Expected Outcomes Short: focus on maintain regular sleep habits and mental health. Long: Maintain positvie attitude       Interventions Encouraged to attend Cardiac Rehabilitation for the exercise       Continue Psychosocial Services  Follow up required by staff                Psychosocial Discharge (Final Psychosocial Re-Evaluation):  Psychosocial Re-Evaluation - 01/15/23 0948       Psychosocial Re-Evaluation   Current issues with Current Sleep Concerns    Comments Reports his sleep has not been good, taking medication and says it helps him. Otherwise he reports no stress or anxiety or depression. Reports he is complinat with his anti-depressant medication    Expected Outcomes Short: focus on maintain regular sleep habits and mental health. Long: Maintain positvie attitude    Interventions Encouraged to attend Cardiac Rehabilitation for the exercise    Continue Psychosocial Services  Follow up required by staff             Vocational Rehabilitation: Provide vocational rehab assistance to qualifying candidates.   Vocational Rehab Evaluation & Intervention:  Vocational Rehab - 11/07/22 1019       Initial Vocational Rehab Evaluation & Intervention   Assessment shows need for Vocational Rehabilitation No      Vocational Rehab Re-Evaulation   Comments retired             Education: Education Goals: Education classes will be provided on a variety of topics geared toward better understanding of heart health and risk factor modification. Participant will state understanding/return demonstration of topics presented as noted by education test scores.  Learning Barriers/Preferences:  Learning Barriers/Preferences - 11/07/22 1008       Learning Barriers/Preferences   Learning Barriers None    Learning Preferences None              General Cardiac Education Topics:  AED/CPR: - Group verbal and written instruction with the use of models to demonstrate the basic use of the AED with the basic ABC's of resuscitation.   Anatomy and Cardiac Procedures: - Group verbal and visual presentation and models provide information about basic cardiac anatomy and function. Reviews the testing methods done to diagnose heart disease and the outcomes of the test results. Describes the treatment choices: Medical Management, Angioplasty, or Coronary Bypass Surgery for treating various heart  conditions including Myocardial Infarction, Angina, Valve Disease, and Cardiac Arrhythmias.  Written material given at graduation. Flowsheet Row Cardiac Rehab from 11/19/2022 in [facility] Cardiac and Pulmonary Rehab  Education need identified 11/19/22       Medication Safety: - Group verbal and visual instruction to review commonly prescribed medications for heart and lung disease. Reviews the medication, class of the drug, and side effects. Includes the steps to properly store meds and maintain the prescription regimen.  Written material given at graduation. Flowsheet Row Cardiac Rehab from 10/07/2019 in [facility] Cardiac and Pulmonary Rehab  Date 10/07/19  Educator SB  Instruction Review Code 1- Verbalizes Understanding       Intimacy: - Group verbal instruction through game format to discuss how heart and lung disease can affect sexual intimacy. Written material given at graduation..   Know Your Numbers and Heart Failure: - Group verbal and visual instruction to discuss disease risk factors for cardiac and pulmonary disease and treatment options.  Reviews associated critical values for Overweight/Obesity, Hypertension, Cholesterol, and Diabetes.  Discusses basics of heart failure: signs/symptoms and treatments.  Introduces Heart Failure Zone chart for action plan for heart failure.  Written material given at graduation.   Infection Prevention: -  Provides verbal and written material to individual with discussion of infection control including proper hand washing and proper equipment cleaning during exercise session. Flowsheet Row Cardiac Rehab from 11/19/2022 in [facility] Cardiac and Pulmonary Rehab  Date 11/19/22  Educator NT  Instruction Review Code 1- Verbalizes Understanding       Falls Prevention: - Provides verbal and written material to individual with discussion of falls prevention and safety. Flowsheet Row Cardiac Rehab from 11/19/2022 in [facility] Cardiac and Pulmonary Rehab  Date 11/19/22  Educator NT  Instruction Review Code 1- Verbalizes Understanding       Other: -Provides group and verbal instruction on various topics (see comments)   Knowledge Questionnaire Score:  Knowledge Questionnaire Score - 11/19/22 1429       Knowledge Questionnaire Score   Pre Score 21/26             Core Components/Risk Factors/Patient Goals at Admission:  Personal Goals and Risk Factors at Admission - 11/07/22 1008       Core Components/Risk Factors/Patient Goals on Admission    Weight Management Yes;Weight Maintenance    Intervention Weight Management: Develop a combined nutrition and exercise program designed to reach desired caloric intake, while maintaining appropriate intake of nutrient and fiber, sodium and fats, and appropriate energy expenditure required for the weight goal.    Admit Weight 204 lb (92.5 kg)    Goal Weight: Long Term 204 lb (92.5 kg)    Expected Outcomes Short Term: Continue to assess and modify interventions until short term weight is achieved;Long Term: Adherence to nutrition and physical activity/exercise program aimed toward attainment of established weight goal;Weight Maintenance: Understanding of the daily nutrition guidelines, which includes 25-35% calories from fat, 7% or less cal from saturated fats, less than 200mg  cholesterol, less than 1.5gm of sodium, & 5 or more servings of fruits and vegetables  daily    Diabetes Yes    Intervention Provide education about signs/symptoms and action to take for hypo/hyperglycemia.;Provide education about proper nutrition, including hydration, and aerobic/resistive exercise prescription along with prescribed medications to achieve blood glucose in normal ranges: Fasting glucose 65-99 mg/dL    Expected Outcomes Short Term: Participant verbalizes understanding of the signs/symptoms and immediate care of hyper/hypoglycemia, proper foot care and importance of medication,  aerobic/resistive exercise and nutrition plan for blood glucose control.;Long Term: Attainment of HbA1C < 7%.    Heart Failure Yes    Intervention Provide a combined exercise and nutrition program that is supplemented with education, support and counseling about heart failure. Directed toward relieving symptoms such as shortness of breath, decreased exercise tolerance, and extremity edema.    Expected Outcomes Improve functional capacity of life;Short term: Attendance in program 2-3 days a week with increased exercise capacity. Reported lower sodium intake. Reported increased fruit and vegetable intake. Reports medication compliance.;Short term: Daily weights obtained and reported for increase. Utilizing diuretic protocols set by physician.;Long term: Adoption of self-care skills and reduction of barriers for early signs and symptoms recognition and intervention leading to self-care maintenance.    Hypertension Yes    Intervention Provide education on lifestyle modifcations including regular physical activity/exercise, weight management, moderate sodium restriction and increased consumption of fresh fruit, vegetables, and low fat dairy, alcohol moderation, and smoking cessation.;Monitor prescription use compliance.    Expected Outcomes Short Term: Continued assessment and intervention until BP is < 140/67mm HG in hypertensive participants. < 130/98mm HG in hypertensive participants with diabetes, heart  failure or chronic kidney disease.;Long Term: Maintenance of blood pressure at goal levels.    Lipids Yes    Intervention Provide education and support for participant on nutrition & aerobic/resistive exercise along with prescribed medications to achieve LDL 70mg , HDL >40mg .    Expected Outcomes Short Term: Participant states understanding of desired cholesterol values and is compliant with medications prescribed. Participant is following exercise prescription and nutrition guidelines.;Long Term: Cholesterol controlled with medications as prescribed, with individualized exercise RX and with personalized nutrition plan. Value goals: LDL < 70mg , HDL > 40 mg.             Education:Diabetes - Individual verbal and written instruction to review signs/symptoms of diabetes, desired ranges of glucose level fasting, after meals and with exercise. Acknowledge that pre and post exercise glucose checks will be done for 3 sessions at entry of program. Flowsheet Row Cardiac Rehab from 01/13/2020 in [facility] Cardiac and Pulmonary Rehab  Date 01/13/20  Educator [facility]  Instruction Review Code 1- Verbalizes Understanding       Core Components/Risk Factors/Patient Goals Review:   Goals and Risk Factor Review     Row Name 01/15/23 [phone]             Core Components/Risk Factors/Patient Goals Review   Personal Goals Review Weight Management/Obesity;Hypertension;Diabetes;Lipids       Review He continue to work on watching his weight, checking his blood sugars now back on metformin. He is checking his blood pressure at home and it has been stable       Expected Outcomes Short: continue to check blood sugars and blood pressure. Long: Work towards healthy weight and more stable A1C and blood pressures                Core Components/Risk Factors/Patient Goals at Discharge (Final Review):   Goals and Risk Factor Review - 01/15/23 0951       Core Components/Risk Factors/Patient Goals Review   Personal Goals  Review Weight Management/Obesity;Hypertension;Diabetes;Lipids    Review He continue to work on watching his weight, checking his blood sugars now back on metformin. He is checking his blood pressure at home and it has been stable    Expected Outcomes Short: continue to check blood sugars and blood pressure. Long: Work towards healthy weight and more stable A1C and blood pressures  ITP Comments:  ITP Comments     Row Name 11/07/22 1019 11/19/22 1426 11/27/22 0939 12/12/22 1229 01/09/23 1516   ITP Comments Virtual orientation call completed today. he has an appointment on Date: [id number]  for EP eval and gym Orientation.  Documentation of diagnosis can be found in [facility]  Date: 10/13/2022  Letter sent for AAA parameters. Completed and gym orientation. Initial ITP created and sent for review to Dr. Daniel Nones, Medical Director. First full day of exercise!  Patient was oriented to gym and equipment including functions, settings, policies, and procedures.  Patient's individual exercise prescription and treatment plan were reviewed.  All starting workloads were established based on the results of the 6 minute walk test done at initial orientation visit.  The plan for exercise progression was also introduced and progression will be customized based on patient's performance and goals. AAA parameters  set by his physician explained to patient. 30 Day review completed. Medical Director ITP review done, changes made as directed, and signed approval by Medical Director.    new to program 30 Day review completed. Medical Director ITP review done, changes made as directed, and signed approval by Medical Director.    Row Name 01/30/23 1115           ITP Comments 30 Day review completed. Medical Director ITP review done, changes made as directed, and signed approval by Medical Director.                Comments:

## 2023-01-31 ENCOUNTER — Encounter: Payer: Medicare HMO | Admitting: *Deleted

## 2023-01-31 DIAGNOSIS — Z48812 Encounter for surgical aftercare following surgery on the circulatory system: Secondary | ICD-10-CM | POA: Diagnosis not present

## 2023-01-31 DIAGNOSIS — Z955 Presence of coronary angioplasty implant and graft: Secondary | ICD-10-CM

## 2023-01-31 DIAGNOSIS — I214 Non-ST elevation (NSTEMI) myocardial infarction: Secondary | ICD-10-CM

## 2023-01-31 NOTE — Progress Notes (Signed)
Daily Session Note  Patient Details  Name: Devin [name]. MRN: [id number] Date of Birth: 21-Jan-1946 Referring Provider:   Flowsheet Row Cardiac Rehab from 11/19/2022 in [facility] Cardiac and Pulmonary Rehab  Referring Provider Dr. Adrian Blackwater MD       Encounter Date: 01/31/2023  Check In:  Session Check In - 01/31/23 1528       Check-In   Supervising physician immediately available to respond to emergencies See telemetry face sheet for immediately available ER MD    Location [facility]-Cardiac & Pulmonary Rehab    Staff Present Susann Givens, RN BSN;Maxon Conetta BS, , Exercise Physiologist;Joseph Shorewood, RCP,RRT,BSRT;Noah Tickle, BS, Exercise Physiologist    Virtual Visit No    Medication changes reported     No    Fall or balance concerns reported    No    Warm-up and Cool-down Performed on first and last piece of equipment    Resistance Training Performed Yes    VAD Patient? No    PAD/SET Patient? No      Pain Assessment   Currently in Pain? No/denies                Social History   Tobacco Use  Smoking Status Former  Smokeless Tobacco Never  Tobacco Comments   Quit over 40 years ago    Goals Met:  Independence with exercise equipment Exercise tolerated well No report of concerns or symptoms today Strength training completed today  Goals Unmet:  Not Applicable  Comments: Pt able to follow exercise prescription today without complaint.  Will continue to monitor for progression.    Dr. Bethann Punches is Medical Director for [name].  Dr. Vida Rigger is Medical Director for [facility] Pulmonary Rehabilitation.

## 2023-02-04 ENCOUNTER — Ambulatory Visit: Payer: Medicare HMO

## 2023-02-04 ENCOUNTER — Encounter: Payer: Medicare HMO | Admitting: *Deleted

## 2023-02-04 DIAGNOSIS — R2689 Other abnormalities of gait and mobility: Secondary | ICD-10-CM

## 2023-02-04 DIAGNOSIS — M6281 Muscle weakness (generalized): Secondary | ICD-10-CM | POA: Diagnosis not present

## 2023-02-04 DIAGNOSIS — Z955 Presence of coronary angioplasty implant and graft: Secondary | ICD-10-CM

## 2023-02-04 DIAGNOSIS — I214 Non-ST elevation (NSTEMI) myocardial infarction: Secondary | ICD-10-CM

## 2023-02-04 DIAGNOSIS — Z48812 Encounter for surgical aftercare following surgery on the circulatory system: Secondary | ICD-10-CM | POA: Diagnosis not present

## 2023-02-04 NOTE — Therapy (Signed)
OUTPATIENT PHYSICAL THERAPY LOWER EXTREMITY TREATMENT Re-certification through 02/18/23   Patient Name: Devin [name]. MRN: [id number] DOB:[birth year], 77 y.o., male Today's Date: 02/04/2023  END OF SESSION:  PT End of Session - 02/04/23 1158     Visit Number 25    Number of Visits 30    Date for PT Re-Evaluation 02/18/23    Authorization Type re-cert/PN done today 01/21/23 x 4 weeks (8 additional visits)    PT Start Time 1200    PT Stop Time 1244    PT Time Calculation (min) 44 min    Equipment Utilized During Treatment Gait belt    Activity Tolerance Patient tolerated treatment well    Behavior During Therapy WFL for tasks assessed/performed                 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   Hypertension    Ischemic cardiomyopathy    MI, old    Peripheral neuropathy    PFO (patent foramen ovale) 05/2019   s/p repair   Sleep apnea    Spinal stenosis of lumbar region    TIA (transient ischemic attack)    Past Surgical History:  Procedure Laterality Date   APPENDECTOMY     BREAST SURGERY Right    benign mass   CARDIOVERSION Right 12/15/2019   Procedure: CARDIOVERSION;  Surgeon: Laurier Nancy, MD;  Location: [facility];  Service: Cardiovascular;  Laterality: Right;   CATARACT EXTRACTION, BILATERAL     COLONOSCOPY     CORONARY ANGIOPLASTY WITH STENT PLACEMENT     CORONARY ARTERY BYPASS GRAFT  05/2019   LIMA-LAD   CORONARY STENT INTERVENTION N/A 10/15/2022   Procedure: CORONARY STENT INTERVENTION;  Surgeon: Marcina Millard, MD;  Location: [facility];  Service: Cardiovascular;   Laterality: N/A;   LEFT HEART CATH N/A 10/15/2022   Procedure: Left Heart Cath;  Surgeon: Marcina Millard, MD;  Location: [facility];  Service: Cardiovascular;  Laterality: N/A;   LEFT HEART CATH AND CORS/GRAFTS ANGIOGRAPHY N/A 05/21/2019   Procedure: LEFT HEART CATH AND CORONARY ANGIOGRAPHY;  Surgeon: Laurier Nancy, MD;  Location: [facility];  Service: Cardiovascular;  Laterality: N/A;   LUMBAR LAMINECTOMY/DECOMPRESSION MICRODISCECTOMY N/A 07/04/2020   Procedure: L4-5 LAMINECTOMY;  Surgeon: Lucy Chris, MD;  Location: [facility];  Service: Neurosurgery;  Laterality: N/A;   REPLACEMENT TOTAL KNEE BILATERAL     Patient Active Problem List   Diagnosis Date Noted   NSTEMI (non-ST elevated myocardial infarction) (HCC) 10/13/2022   HFrEF (heart failure with reduced ejection fraction) (HCC) 10/13/2022   Degenerative joint disease (DJD) of lumbar spine 10/12/2022   Frequent falls 05/15/2022   Unsteady gait 04/16/2022   Acute delirium 04/13/2022   Electrolyte abnormality 04/13/2022   Nausea & vomiting 04/13/2022   PAD (peripheral artery disease) (HCC) 09/03/2021   Localized, primary osteoarthritis 11/24/2020   Wound healing, delayed 09/08/2020   History of lumbar surgery 09/08/2020   Chronic ischemic heart disease 07/13/2020   History of MI (myocardial infarction) 07/13/2020   History  of PTCA 1 07/13/2020   Other personal history presenting hazards to health 07/13/2020   Pure hypercholesterolemia 07/13/2020   Elevated lactic acid level 04/19/2020   Generalized weakness 04/09/2020   Fall at home 04/09/2020   Gastroenteritis 04/09/2020   AMS (altered mental status) 04/08/2020   Hypoxia 02/01/2020   Atrial fibrillation status post cardioversion (HCC) 12/22/2019   Congestive heart failure (CHF) (HCC) 12/13/2019   Hyponatremia 12/06/2019   Acute postoperative anemia due to expected blood loss 05/28/2019   Hyperlipidemia 05/28/2019   Ischemic cardiomyopathy 05/28/2019    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   Unstable angina (HCC) 05/24/2019   Chest pain 05/21/2019   Ischemic chest pain (HCC) 05/16/2019   CAD (coronary artery disease) 05/16/2019   HTN (hypertension) 05/16/2019   Depression 05/16/2019   GERD (gastroesophageal reflux disease) 05/16/2019   Verruca plantaris 01/23/2018   Obstructive sleep apnea syndrome 01/20/2018   Major depressive disorder, single episode, moderate (HCC) 07/05/2016   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 Zada Finders, MD  REFERRING PROVIDER: Dr. Zada Finders, MD/ Dev[name], [facility]  REFERRING DIAG:  R26.9 (ICD-10-CM) - Unspecified abnormalities of gait and mobility  R26.89 (ICD-10-CM) - Other abnormalities of gait and mobility  R29.6 (ICD-10-CM) - Repeated falls    THERAPY DIAG:  No diagnosis found.  Rationale for Evaluation and Treatment: Rehabilitation  ONSET DATE: at least 2 years  SUBJECTIVE: (From initial evaluation note)  SUBJECTIVE STATEMENT: Pt reports he is having difficulty with gait and balance.  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   He feels like his balance and walking difficulty have worsened recently.  He reports he started falling frequently, he was catching his foot on things and tripping.  The most recent major fall was about 6 months ago.  He is being careful since then and trying to walk more to get stronger and trying to be more aware of his gait pattern.  He was walking with a walker until ~4 months ago, then started using a cane, and then has tried walking without any assistance.      He feels like his  difficulty walking limits his ability to get out of the house as much as he used to because he is fearful of falling.  PLOF- he enjoys getting out of the house for social activities, going out to breakfast too; going grocery shopping a few times/week to get his groceries; prior to his back surgery he walked at the park in [place], hasn't been back though because he is worried about falling though.  He has a home aide that comes a few days a week to assist with household tasks as needed.  Also reports he had a MI a little over a week ago and was in the [name] (8/3-10/16/22)- he states he is referred to begin cardiac rehab as part of his outpatient recovery.  PERTINENT HISTORY: Extensive cardiac history History of lumbar spine surgery- 2022 L4-5 microdiscectomy/laminectomy   PAIN:  Are you having pain? R groin muscle area where he had the cardiac catheterization procedure last week.     PRECAUTIONS: Fall  RED FLAGS: Pt with recent MI (  10/13/22) with stent placement (see chart review)  WEIGHT BEARING RESTRICTIONS: No  FALLS:  Has patient fallen in last 6 months? Yes. Number of falls multiple  LIVING ENVIRONMENT: Lives with: lives alone (has a self reported "lady friend" for social support and enjoys being able to get out the house for community activities, going to restaurants together) Lives in: House/apartment Stairs:  he lives in a single level home with bonus room over the garage, but he does not need to go upstairs.1 step to enter in the garage- able to get in without falling. Has following equipment at home: Single point cane, Walker - 4 wheeled, and Wheelchair (manual) Has a home health aide that comes 2-3x/week if he needs assistance  OCCUPATION: retired  PLOF: Independent  PATIENT GOALS: to address drop foot and be able to manage it better; to strengthen his legs; and to increase his breathing; and improve his balance   NEXT MD VISIT: yes, next week   OBJECTIVE:   DIAGNOSTIC  FINDINGS: no recent lower extremity/spine imaging  PATIENT SURVEYS:  FOTO 39/52  COGNITION: Overall cognitive status: Within functional limits for tasks assessed     SENSATION: Pt reports reduced sensation to light touch L L4-5 dermatomes  EDEMA:  No pitting edema noted on dorsum of foot   POSTURE: pt stands with wide base of support for balance; uses UE support on LE's for sit to stand transfer  PALPATION: (-) TTP distal LE b/l  LOWER EXTREMITY ROM:  Active ROM Right eval Left eval  Hip flexion WNL WNL  Hip extension    Hip abduction    Hip adduction    Hip internal rotation    Hip external rotation    Knee flexion 105 105  Knee extension -5 -5  Ankle dorsiflexion 5 deg PROM 0 deg  Ankle plantarflexion    Great toe extension 45 deg PROM 40 deg   Ankle eversion     (Blank rows = not tested)  LOWER EXTREMITY MMT:  MMT Right eval Left eval  Hip flexion 4 4  Hip extension    Hip abduction    Hip adduction    Hip internal rotation    Hip external rotation    Knee flexion 5 5  Knee extension 4 4  Ankle dorsiflexion 4/5 1/5  Ankle plantarflexion 3/5 3/5  Great toe extension 4/5 2/5  Ankle eversion 4/5 3/5   (Blank rows = not tested)   FUNCTIONAL TESTS:  OUTCOME MEASURES: TEST Outcome Interpretation  5 times sit<>stand 21 seconds >60 yo, >15 sec indicates increased risk for falls  10 meter walk test deferred <1.0 m/s indicates increased risk for falls; limited community ambulator  [employer] Assessment /56   deferred <36/56 (100% risk for falls), 37-45 (80% risk for falls); 46-51 (>50% risk for falls); 52-55 (lower risk <25% of falls)      GAIT: Distance walked: from waiting area into PT gym 50 ft, PT supervision Assistive device utilized: None Level of assistance: Complete Independence Comments: Pt ambulates with decreased gait speed, wide BOS, decreased stride length, excess L hip flexion strategy to compensate for L foot drop  Pt not able to  stand SLS on L without UE support today; able to stand R LE x 5 seconds  TODAY'S TREATMENT:  DATE: 02/04/23 Subjective:  Pt states he arrives with good energy today.  He has been working on his walking since last PT session.  Has cardiac rehab later today.  No falls to report.  Did some walking at the farmer's market at [place] this weekend.  Pain: 0/10  Objective: HR 70 SP02% 99  Therapeutic Exercises:  While wearing L AFO, no SPC during session, gait belt and PT supervision/SBA during standing exercises  Nautilus: 30 lbs resistance around trunk/pelvis Lateral walking- 4 laps R, 4 laps L Forward/reverse- 4 laps  Obstacle course: stepping over yoga blocks, on/off/balancing on airex: 6 laps- practiced with PT hand hold support 2 lap then reducing UE support and pt doing without hand hold support 4 laps  Sit to stand 2x8 with 8# weight held at chest; 2 sets from chair with airex cushion- no use of UE; reviewed body mechanics of scooting hips forward and feet back for optimal mechanics during sit to stand  Weave in/out of cones on floor without SPC- PT supervision 4 laps to facilitate navigating obstacles in crowded room/space    Not today: Training for amb on uneven outdoor surfaces- incline/decline sidewalk; grass forward/reverse/lateral, curb situation navigation with [facility]- practice stepping up/down curb using SPC in R hand for scenarios where there may not be a railing; stepped down with L LE and pt prefers stepping up with L LE as SPC is in R hand. (5 min) Not today  Walk 1 loop around the building with PT supervision/gait belt- used his SPC, focused on navigating uneven/ramp/inclined/declined sidewalk surfaces.  No major loss of balance. Not today Side stepping forward/laterally/reverse over airex balance beam in confined small space, without UE support- to  facilitate using stepping strategy for balance in crowded spaces- 6 laps in parallel bars  Side stepping ON airex without UE support in parallel bars 4 laps  Step ups onto 6 inch step: with 2 finger support ea UE; x10 lead with L LE, x10 leading with R LE   Star drill: PT verbal cue for R or L and step/weight shift forward/lateral/diagonal outside base of support then back to midline- multiple directions/rounds x 4 min with PT close supervision  Squat lift 5 cones from low surface (cones resting on 6 inch step, hand cones to PT then put cones back on the floor- 2 rounds Standing alternating toe taps to green cones: sets of 12 alternating- PT supervision with gait belt- not today  Alternating step ups on airex: x6 lead with R and x6 lead with L- in parallel bars- not today  PT SBA with gait belt: Not today High knee marches forward through blue ladder x 2 laps- not today  High knee lateral marches through ladder x 2 laps- not today  Diagonal ladder x 2 laps- not today  Reverse walking 4x 10 m- not today  Obstacle course in parallel bars: not today Step up/on/off airex, over 6 inch hurdle, cone toe taps R/L x5, step around cone in small space, step up/on/off airex- repeat opposite direction -Hurdles: forward direction in // bars (PT supervision with gait belt) 6 laps, focused on hip flexion strategy for foot clearance; attempted cue for "lift toes" but pt not able to actively perform active DF of ankle against gravity -Side step over hurdles in // bars: 6 laps -Airex: feet together, 3 trials x 30-40 seconds with removing hand support -Airex: tandem stance, 3 trials each R in front/L in front x 20-30 seconds removing hand support (pt notes ankle/lower leg  fatigue after this) -Seated knee extension: 5# AW 2x20  -Seated marches: 5# AW 2x20  PATIENT EDUCATION:  Education details: PT POC/goals, importance of attending cardiac rehab, activity pacing Person educated: Patient Education  method: Explanation Education comprehension: verbalized understanding and needs further education  HOME EXERCISE PROGRAM: Access Code: [email or web address] URL: [email or web address] Date: 10/24/2022 Prepared by: Max Fickle  Exercises - Seated Heel Raise  - 1 x daily - 3 x weekly - 2 sets - 20 reps - Seated March  - 1 x daily - 3 x weekly - 2 sets - 10 reps - Seated Long Arc Quad  - 1 x daily - 3 x weekly - 2 sets - 10 reps  ASSESSMENT:  CLINICAL IMPRESSION: Pt was challenged with nautilus exercise today; had difficulty maintaining balance while controlling resistance during R>L  eccentric side step/weight acceptance.  Would benefit from more practice with strengthening and balance exercises that require him to respond/react to external forces to improve his safety, improve his walking endurance, and reduce fear of falling.  Patient will continue to benefit from skilled PT intervention to address listed impairments, to decrease fall risk and improve overall mobility and safety during ambulation.  Likely has not met maximum recovery with skilled PT.  Anticipate 1 additional month of tx to promote optimal long term outcome with PT.  OBJECTIVE IMPAIRMENTS: Abnormal gait, cardiopulmonary status limiting activity, decreased activity tolerance, decreased balance, decreased mobility, difficulty walking, decreased ROM, and decreased strength.   ACTIVITY LIMITATIONS: standing, squatting, stairs, transfers, and locomotion level  PARTICIPATION LIMITATIONS: meal prep, cleaning, laundry, interpersonal relationship, shopping, and community activity  PERSONAL FACTORS: Age, Past/current experiences, Time since onset of injury/illness/exacerbation, and cardiovascular hx including recent MI with stent placement last week  are also affecting patient's functional outcome.   REHAB POTENTIAL: Good  CLINICAL DECISION MAKING: Stable/uncomplicated  EVALUATION COMPLEXITY: Low   GOALS: Goals reviewed  with patient? Yes  SHORT TERM GOALS: Target date: 11/11/22 Pt will be able to perform a HEP for LE strengthening >3x/week Baseline: 11/27/22 pt has been instructed on a HEP; 12/24/22: met Goal status: Met   LONG TERM GOALS: Target date: 02/18/23  Improve FOTO to >51 indicating pt able to perform his daily activities without being limited by his balance; 12/24/22: in progress Baseline: 39; 10/7: 54; 01/21/23 51 Goal status: In Progress  2.  Pt will be able to amb with typical width BOS and b/l heel strike gait pattern using least restrictive AD and AFO on flat/incline/decline/grass surfaces x 10 min Baseline: not using any AD or AFO in clinic, pt wide BOS, no L heel strike due to foot drop; 11/27/22 pt is amb with SPC now, discussions continue regarding AFO/brace for improved safety; 12/24/22: pt now wearing AFO and using SPC on uneven surfaces with PT SBA x 5 minutes; 01/21/23: in progress- able to amb intervals of 5-6 min x 2 with 1-2 min sitting break between Goal status: In progress  3.  Pt will be able to perform 5x STS in <15 sec indicating reduced risk for falling Baseline: 21 sec, 11/27/22 19 sec (with mild UE use); 12/28/22: 18 seconds  (with mild UE support) 01/21/23:  19 seconds without UE support Goal status: In Progress  4. Pt will be able to amb x 1 block distance to walk his dog 1x/day.  Baseline: 1/4-1/2 block (pt self report), 01/21/23: amb 1 loop around building in 5.25 minutes at clinic  Goal status: In progress  5. Improve 10 M walk test to >  1.0 m/s to promote reduced fall risk and improved community ambulator status  Baseline: 12/24/22 .9 m/s; 01/21/23: .8 m/s (at end of session, was fatigued)  Goal status: In progress  PLAN:  PT FREQUENCY: 2x/week  PT DURATION: 4 weeks  PLANNED INTERVENTIONS: Therapeutic exercises, Therapeutic activity, Neuromuscular re-education, Balance training, Gait training, Patient/Family education, Self Care, Joint mobilization, and  Orthotic/Fit training  PLAN FOR NEXT SESSION: continue with proximal hip and LE strengthening, multidirectional movements/dynamic balance activities, improving confidence/reducing fear of falling through activities in small spaces and that require increased reaction speed to unpredictable external stimuli, gait training with his new AFO he gets later this week on even and uneven surfaces and working on improving walking endurance   Max Fickle, PT, DPT, OCS  Physical Therapist - [name]   Ardine Bjork, PT 02/04/2023, 11:58 AM

## 2023-02-04 NOTE — Progress Notes (Signed)
Daily Session Note  Patient Details  Name: Devin [name]. MRN: [id number] Date of Birth: January 02, 1946 Referring Provider:   Flowsheet Row Cardiac Rehab from 11/19/2022 in [facility]  Referring Provider Dr. Adrian Blackwater MD       Encounter Date: 02/04/2023  Check In:  Session Check In - 02/04/23 1537       Check-In   Supervising physician immediately available to respond to emergencies See telemetry face sheet for immediately available ER MD    Location [facility]    Staff Present Susann Givens, RN BSN;Maxon Conetta BS, , Exercise Physiologist;Megan Katrinka Blazing, RN, ADN;Joseph Reino Kent, RCP,RRT,BSRT    Virtual Visit No    Medication changes reported     No    Fall or balance concerns reported    No    Warm-up and Cool-down Performed on first and last piece of equipment    Resistance Training Performed Yes    VAD Patient? No    PAD/SET Patient? No      Pain Assessment   Currently in Pain? No/denies                Social History   Tobacco Use  Smoking Status Former  Smokeless Tobacco Never  Tobacco Comments   Quit over 40 years ago    Goals Met:  Independence with exercise equipment Exercise tolerated well No report of concerns or symptoms today Strength training completed today  Goals Unmet:  Not Applicable  Comments: Pt able to follow exercise prescription today without complaint.  Will continue to monitor for progression.    Dr. Bethann Punches is Medical Director for [facility] Cardiac Rehabilitation.  Dr. Vida Rigger is Medical Director for [facility] Pulmonary Rehabilitation.

## 2023-02-11 ENCOUNTER — Ambulatory Visit: Payer: Medicare HMO | Attending: Family Medicine

## 2023-02-11 ENCOUNTER — Encounter: Payer: Medicare HMO | Attending: Cardiovascular Disease | Admitting: *Deleted

## 2023-02-11 DIAGNOSIS — Z48812 Encounter for surgical aftercare following surgery on the circulatory system: Secondary | ICD-10-CM | POA: Diagnosis present

## 2023-02-11 DIAGNOSIS — Z87891 Personal history of nicotine dependence: Secondary | ICD-10-CM | POA: Insufficient documentation

## 2023-02-11 DIAGNOSIS — R2689 Other abnormalities of gait and mobility: Secondary | ICD-10-CM | POA: Insufficient documentation

## 2023-02-11 DIAGNOSIS — I252 Old myocardial infarction: Secondary | ICD-10-CM | POA: Insufficient documentation

## 2023-02-11 DIAGNOSIS — M6281 Muscle weakness (generalized): Secondary | ICD-10-CM | POA: Diagnosis present

## 2023-02-11 DIAGNOSIS — Z955 Presence of coronary angioplasty implant and graft: Secondary | ICD-10-CM | POA: Diagnosis present

## 2023-02-11 DIAGNOSIS — I214 Non-ST elevation (NSTEMI) myocardial infarction: Secondary | ICD-10-CM

## 2023-02-11 NOTE — Therapy (Signed)
OUTPATIENT PHYSICAL THERAPY LOWER EXTREMITY TREATMENT Re-certification through 02/18/23   Patient Name: Devin [name] [facility] MRN: [id number] DOB:[birth year], 77 y.o., male Today's Date: 02/11/2023  END OF SESSION:  PT End of Session - 02/11/23 1223     Visit Number 26    Number of Visits 30    Date for PT Re-Evaluation 02/18/23    Authorization Type re-cert/PN done today 01/21/23 x 4 weeks (8 additional visits)    PT Start Time 1200    PT Stop Time 1245    PT Time Calculation (min) 45 min    Equipment Utilized During Treatment Gait belt    Activity Tolerance Patient tolerated treatment well    Behavior During Therapy WFL for tasks assessed/performed                 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   Hypertension    Ischemic cardiomyopathy    MI, old    Peripheral neuropathy    PFO (patent foramen ovale) 05/2019   s/p repair   Sleep apnea    Spinal stenosis of lumbar region    TIA (transient ischemic attack)    Past Surgical History:  Procedure Laterality Date   APPENDECTOMY     BREAST SURGERY Right    benign mass   CARDIOVERSION Right 12/15/2019   Procedure: CARDIOVERSION;  Surgeon: Laurier Nancy, MD;  Location: [facility] ORS;  Service: Cardiovascular;  Laterality: Right;   CATARACT EXTRACTION, BILATERAL     COLONOSCOPY     CORONARY ANGIOPLASTY WITH STENT PLACEMENT     CORONARY ARTERY BYPASS GRAFT  05/2019   LIMA-LAD   CORONARY STENT INTERVENTION N/A 10/15/2022   Procedure: CORONARY STENT INTERVENTION;  Surgeon: Marcina Millard, MD;  Location: [facility] INVASIVE CV LAB;  Service: Cardiovascular;   Laterality: N/A;   LEFT HEART CATH N/A 10/15/2022   Procedure: Left Heart Cath;  Surgeon: Marcina Millard, MD;  Location: [facility] INVASIVE CV LAB;  Service: Cardiovascular;  Laterality: N/A;   LEFT HEART CATH AND CORS/GRAFTS ANGIOGRAPHY N/A 05/21/2019   Procedure: LEFT HEART CATH AND CORONARY ANGIOGRAPHY;  Surgeon: Laurier Nancy, MD;  Location: [facility] INV[facility]IVE CV LAB;  Service: Cardiovascular;  Laterality: N/A;   LUMBAR LAMINECTOMY/DECOMPRESSION MICRODISCECTOMY N/A 07/04/2020   Procedure: L4-5 LAMINECTOMY;  Surgeon: Lucy Chris, MD;  Location: [facility] ORS;  Service: Neurosurgery;  Laterality: N/A;   REPLACEMENT TOTAL KNEE BILATERAL     Patient Active Problem List   Diagnosis Date Noted   NSTEMI (non-ST elevated myocardial infarction) (HCC) 10/13/2022   HFrEF (heart failure with reduced ejection fraction) (HCC) 10/13/2022   Degenerative joint disease (DJD) of lumbar spine 10/12/2022   Frequent falls 05/15/2022   Unsteady gait 04/16/2022   Acute delirium 04/13/2022   Electrolyte abnormality 04/13/2022   Nausea & vomiting 04/13/2022   PAD (peripheral artery disease) (HCC) 09/03/2021   Localized, primary osteoarthritis 11/24/2020   Wound healing, delayed 09/08/2020   History of lumbar surgery 09/08/2020   Chronic ischemic heart disease 07/13/2020   History of MI (myocardial infarction) 07/13/2020   History  of PTCA 1 07/13/2020   Other personal history presenting hazards to health 07/13/2020   Pure hypercholesterolemia 07/13/2020   Elevated lactic acid level 04/19/2020   Generalized weakness 04/09/2020   Fall at home 04/09/2020   Gastroenteritis 04/09/2020   AMS (altered mental status) 04/08/2020   Hypoxia 02/01/2020   Atrial fibrillation status post cardioversion (HCC) 12/22/2019   Congestive heart failure (CHF) (HCC) 12/13/2019   Hyponatremia 12/06/2019   Acute postoperative anemia due to expected blood loss 05/28/2019   Hyperlipidemia 05/28/2019   Ischemic cardiomyopathy 05/28/2019    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   Unstable angina (HCC) 05/24/2019   Chest pain 05/21/2019   Ischemic chest pain (HCC) 05/16/2019   CAD (coronary artery disease) 05/16/2019   HTN (hypertension) 05/16/2019   Depression 05/16/2019   GERD (gastroesophageal reflux disease) 05/16/2019   Verruca plantaris 01/23/2018   Obstructive sleep apnea syndrome 01/20/2018   Major depressive disorder, single episode, moderate (HCC) 07/05/2016   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 Zada Finders, MD  REFERRING PROVIDER: Dr. Zada Finders, MD/ [name], [facility]  REFERRING DIAG:  R26.9 (ICD-10-CM) - Unspecified abnormalities of gait and mobility  R26.89 (ICD-10-CM) - Other abnormalities of gait and mobility  R29.6 (ICD-10-CM) - Repeated falls    THERAPY DIAG:  Muscle weakness (generalized)  Balance problem  Rationale for Evaluation and Treatment: Rehabilitation  ONSET DATE: at least 2 years  SUBJECTIVE: (From initial evaluation note)  SUBJECTIVE STATEMENT: Pt reports he is having difficulty with gait and balance.  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   He feels like his balance and walking difficulty have worsened recently.  He reports he started falling frequently, he was catching his foot on things and tripping.  The most recent major fall was about 6 months ago.  He is being careful since then and trying to walk more to get stronger and trying to be more aware of his gait pattern.  He was walking with a walker until ~4 months ago, then started using a cane, and then has tried walking without any assistance.       He feels like his difficulty walking limits his ability to get out of the house as much as he used to because he is fearful of falling.  PLOF- he enjoys getting out of the house for social activities, going out to breakfast too; going grocery shopping a few times/week to get his groceries; prior to his back surgery he walked at the park in [place], hasn't been back though because he is worried about falling though.  He has a home aide that comes a few days a week to assist with household tasks as needed.  Also reports he had a MI a little over a week ago and was in the hospital (8/3-10/16/22)- he states he is referred to begin cardiac rehab as part of his outpatient recovery.  PERTINENT HISTORY: Extensive cardiac history History of lumbar spine surgery- 2022 L4-5 microdiscectomy/laminectomy   PAIN:  Are you having pain? R groin muscle area where he had the cardiac catheterization procedure last week.     PRECAUTIONS: Fall  RED FLAGS: Pt  with recent MI (10/13/22) with stent placement (see chart review)  WEIGHT BEARING RESTRICTIONS: No  FALLS:  Has patient fallen in last 6 months? Yes. Number of falls multiple  LIVING ENVIRONMENT: Lives with: lives alone (has a self reported "lady friend" for social support and enjoys being able to get out the house for community activities, going to restaurants together) Lives in: House/apartment Stairs:  he lives in a single level home with bonus room over the garage, but he does not need to go upstairs.1 step to enter in the garage- able to get in without falling. Has following equipment at home: Single point cane, Walker - 4 wheeled, and Wheelchair (manual) Has a home health aide that comes 2-3x/week if he needs assistance  OCCUPATION: retired  PLOF: Independent  PATIENT GOALS: to address drop foot and be able to manage it better; to strengthen his legs; and to increase his breathing; and improve his balance   NEXT MD VISIT: yes, next week    OBJECTIVE:   DIAGNOSTIC FINDINGS: no recent lower extremity/spine imaging  PATIENT SURVEYS:  FOTO 39/52  COGNITION: Overall cognitive status: Within functional limits for tasks assessed     SENSATION: Pt reports reduced sensation to light touch L L4-5 dermatomes  EDEMA:  No pitting edema noted on dorsum of foot   POSTURE: pt stands with wide base of support for balance; uses UE support on LE's for sit to stand transfer  PALPATION: (-) TTP distal LE b/l  LOWER EXTREMITY ROM:  Active ROM Right eval Left eval  Hip flexion WNL WNL  Hip extension    Hip abduction    Hip adduction    Hip internal rotation    Hip external rotation    Knee flexion 105 105  Knee extension -5 -5  Ankle dorsiflexion 5 deg PROM 0 deg  Ankle plantarflexion    Great toe extension 45 deg PROM 40 deg   Ankle eversion     (Blank rows = not tested)  LOWER EXTREMITY MMT:  MMT Right eval Left eval  Hip flexion 4 4  Hip extension    Hip abduction    Hip adduction    Hip internal rotation    Hip external rotation    Knee flexion 5 5  Knee extension 4 4  Ankle dorsiflexion 4/5 1/5  Ankle plantarflexion 3/5 3/5  Great toe extension 4/5 2/5  Ankle eversion 4/5 3/5   (Blank rows = not tested)   FUNCTIONAL TESTS:  OUTCOME MEASURES: TEST Outcome Interpretation  5 times sit<>stand 21 seconds >60 yo, >15 sec indicates increased risk for falls  10 meter walk test deferred <1.0 m/s indicates increased risk for falls; limited community ambulator  [employer] Assessment /56   deferred <36/56 (100% risk for falls), 37-45 (80% risk for falls); 46-51 (>50% risk for falls); 52-55 (lower risk <25% of falls)      GAIT: Distance walked: from waiting area into PT gym 50 ft, PT supervision Assistive device utilized: None Level of assistance: Complete Independence Comments: Pt ambulates with decreased gait speed, wide BOS, decreased stride length, excess L hip flexion strategy to compensate for L  foot drop  Pt not able to stand SLS on L without UE support today; able to stand R LE x 5 seconds  TODAY'S TREATMENT:  DATE: 02/11/23 Subjective:  Pt with no new complaints; he continues to work on walking without his cane this week  Pain: 0/10  Objective: HR 70 SP02% 99  Therapeutic Exercises:  While wearing L AFO, no SPC during session, gait belt and PT supervision/SBA during standing exercises  Nautilus: 30 lbs resistance around trunk/pelvis Lateral walking- 4 laps R, 4 laps L Forward/reverse- 4 laps  Obstacle course: stepping over yoga blocks, on/off/balancing on airex: 6 laps- practiced with PT hand hold support 2 lap then reducing UE support and pt doing without hand hold support 4 laps  Sit to stand 2x8 with 8# weight held at chest; 2 sets from chair with airex cushion- no use of UE; reviewed body mechanics of scooting hips forward and feet back for optimal mechanics during sit to stand  Weave in/out of cones on floor without SPC- PT supervision 4 laps to facilitate navigating obstacles in crowded room/space   Walking 5 min without SPC  Squat to pick up cones from elevated floor surface(on shoe box) 1 at a time 2 trials of 6 cones- very difficult at end of session  Not today: Training for amb on uneven outdoor surfaces- incline/decline sidewalk; grass forward/reverse/lateral, curb situation navigation with [facility]- practice stepping up/down curb using SPC in R hand for scenarios where there may not be a railing; stepped down with L LE and pt prefers stepping up with L LE as SPC is in R hand. (5 min) Not today  Walk 1 loop around the building with PT supervision/gait belt- used his SPC, focused on navigating uneven/ramp/inclined/declined sidewalk surfaces.  No major loss of balance. Not today Side stepping forward/laterally/reverse over airex balance beam in  confined small space, without UE support- to facilitate using stepping strategy for balance in crowded spaces- 6 laps in parallel bars  Side stepping ON airex without UE support in parallel bars 4 laps  Step ups onto 6 inch step: with 2 finger support ea UE; x10 lead with L LE, x10 leading with R LE   Star drill: PT verbal cue for R or L and step/weight shift forward/lateral/diagonal outside base of support then back to midline- multiple directions/rounds x 4 min with PT close supervision  Squat lift 5 cones from low surface (cones resting on 6 inch step, hand cones to PT then put cones back on the floor- 2 rounds Standing alternating toe taps to green cones: sets of 12 alternating- PT supervision with gait belt- not today  Alternating step ups on airex: x6 lead with R and x6 lead with L- in parallel bars- not today  PT SBA with gait belt: Not today High knee marches forward through blue ladder x 2 laps- not today  High knee lateral marches through ladder x 2 laps- not today  Diagonal ladder x 2 laps- not today  Reverse walking 4x 10 m- not today  Obstacle course in parallel bars: not today Step up/on/off airex, over 6 inch hurdle, cone toe taps R/L x5, step around cone in small space, step up/on/off airex- repeat opposite direction -Hurdles: forward direction in // bars (PT supervision with gait belt) 6 laps, focused on hip flexion strategy for foot clearance; attempted cue for "lift toes" but pt not able to actively perform active DF of ankle against gravity -Side step over hurdles in // bars: 6 laps -Airex: feet together, 3 trials x 30-40 seconds with removing hand support -Airex: tandem stance, 3 trials each R in front/L in front x 20-30 seconds removing hand  support (pt notes ankle/lower leg fatigue after this) -Seated knee extension: 5# AW 2x20  -Seated marches: 5# AW 2x20  PATIENT EDUCATION:  Education details: PT POC/goals, importance of attending cardiac rehab, activity  pacing Person educated: Patient Education method: Explanation Education comprehension: verbalized understanding and needs further education  HOME EXERCISE PROGRAM: Access Code: [email or web address] URL: [email or web address] Date: 10/24/2022 Prepared by: Max Fickle  Exercises - Seated Heel Raise  - 1 x daily - 3 x weekly - 2 sets - 20 reps - Seated March  - 1 x daily - 3 x weekly - 2 sets - 10 reps - Seated Long Arc Quad  - 1 x daily - 3 x weekly - 2 sets - 10 reps  ASSESSMENT:  CLINICAL IMPRESSION: Pt was challenged with nautilus exercise today; had difficulty maintaining balance while controlling resistance during R>L  eccentric side step/weight acceptance.  Would benefit from more practice with strengthening and balance exercises that require him to respond/react to external forces to improve his safety, improve his walking endurance, and reduce fear of falling.  He is able to perform entire session today without use of SPC.  Overall, this reflects improvement in balance and strength made with PT.  Patient will continue to benefit from skilled PT intervention to address listed impairments, to decrease fall risk and improve overall mobility and safety during ambulation.  Likely has not met maximum recovery with skilled PT.  OBJECTIVE IMPAIRMENTS: Abnormal gait, cardiopulmonary status limiting activity, decreased activity tolerance, decreased balance, decreased mobility, difficulty walking, decreased ROM, and decreased strength.   ACTIVITY LIMITATIONS: standing, squatting, stairs, transfers, and locomotion level  PARTICIPATION LIMITATIONS: meal prep, cleaning, laundry, interpersonal relationship, shopping, and community activity  PERSONAL FACTORS: Age, Past/current experiences, Time since onset of injury/illness/exacerbation, and cardiovascular hx including recent MI with stent placement last week  are also affecting patient's functional outcome.   REHAB POTENTIAL:  Good  CLINICAL DECISION MAKING: Stable/uncomplicated  EVALUATION COMPLEXITY: Low   GOALS: Goals reviewed with patient? Yes  SHORT TERM GOALS: Target date: 11/11/22 Pt will be able to perform a HEP for LE strengthening >3x/week Baseline: 11/27/22 pt has been instructed on a HEP; 12/24/22: met Goal status: Met   LONG TERM GOALS: Target date: 02/18/23  Improve FOTO to >51 indicating pt able to perform his daily activities without being limited by his balance; 12/24/22: in progress Baseline: 39; 10/7: 54; 01/21/23 51 Goal status: In Progress  2.  Pt will be able to amb with typical width BOS and b/l heel strike gait pattern using least restrictive AD and AFO on flat/incline/decline/grass surfaces x 10 min Baseline: not using any AD or AFO in clinic, pt wide BOS, no L heel strike due to foot drop; 11/27/22 pt is amb with SPC now, discussions continue regarding AFO/brace for improved safety; 12/24/22: pt now wearing AFO and using SPC on uneven surfaces with PT SBA x 5 minutes; 01/21/23: in progress- able to amb intervals of 5-6 min x 2 with 1-2 min sitting break between Goal status: In progress  3.  Pt will be able to perform 5x STS in <15 sec indicating reduced risk for falling Baseline: 21 sec, 11/27/22 19 sec (with mild UE use); 12/28/22: 18 seconds  (with mild UE support) 01/21/23:  19 seconds without UE support Goal status: In Progress  4. Pt will be able to amb x 1 block distance to walk his dog 1x/day.  Baseline: 1/4-1/2 block (pt self report), 01/21/23: amb 1 loop around building in 5.25 minutes  at clinic  Goal status: In progress  5. Improve 10 M walk test to >1.0 m/s to promote reduced fall risk and improved community ambulator status  Baseline: 12/24/22 .9 m/s; 01/21/23: .8 m/s (at end of session, was fatigued)  Goal status: In progress  PLAN:  PT FREQUENCY: 2x/week  PT DURATION: 4 weeks  PLANNED INTERVENTIONS: Therapeutic exercises, Therapeutic activity, Neuromuscular  re-education, Balance training, Gait training, Patient/Family education, Self Care, Joint mobilization, and Orthotic/Fit training  PLAN FOR NEXT SESSION: continue with proximal hip and LE strengthening, multidirectional movements/dynamic balance activities, improving confidence/reducing fear of falling through activities in small spaces and that require increased reaction speed to unpredictable external stimuli  Max Fickle, PT, DPT, OCS  Physical Therapist - [facility]   Ardine Bjork, PT 02/11/2023, 12:24 PM

## 2023-02-11 NOTE — Progress Notes (Signed)
Daily Session Note  Patient Details  Name: Devin [name] [facility]. MRN: [id number] Date of Birth: Apr 09, 1945 Referring Provider:   Flowsheet Row Cardiac Rehab from 11/19/2022 in [facility] Cardiac and Pulmonary Rehab  Referring Provider Dr. Adrian Blackwater MD       Encounter Date: 02/11/2023  Check In:  Session Check In - 02/11/23 1542       Check-In   Supervising physician immediately available to respond to emergencies See telemetry face sheet for immediately available ER MD    Location [facility]-Cardiac & Pulmonary Rehab    Staff Present Susann Givens, RN BSN;Krista Karleen Hampshire RN, Atilano Median, RN, ADN;Maxon Conetta BS, , Exercise Physiologist    Virtual Visit No    Medication changes reported     No    Fall or balance concerns reported    No    Warm-up and Cool-down Performed on first and last piece of equipment    Resistance Training Performed Yes    VAD Patient? No    PAD/SET Patient? No      Pain Assessment   Currently in Pain? No/denies                Social History   Tobacco Use  Smoking Status Former  Smokeless Tobacco Never  Tobacco Comments   Quit over 40 years ago    Goals Met:  Independence with exercise equipment Exercise tolerated well No report of concerns or symptoms today Strength training completed today  Goals Unmet:  Not Applicable  Comments: Pt able to follow exercise prescription today without complaint.  Will continue to monitor for progression.   Reviewed home exercise with pt today.  Pt plans to walk and continue with physical therapy for exercise.  Reviewed THR, pulse, RPE, sign and symptoms, pulse oximetery and when to call 911 or MD.  Also discussed weather considerations and indoor options.  Pt voiced understanding.   Dr. Bethann Punches is Medical Director for [facility] Cardiac Rehabilitation.  Dr. Vida Rigger is Medical Director for [facility] Pulmonary Rehabilitation.

## 2023-02-13 ENCOUNTER — Ambulatory Visit: Payer: Medicare HMO

## 2023-02-13 DIAGNOSIS — M6281 Muscle weakness (generalized): Secondary | ICD-10-CM | POA: Diagnosis not present

## 2023-02-13 DIAGNOSIS — R2689 Other abnormalities of gait and mobility: Secondary | ICD-10-CM

## 2023-02-14 ENCOUNTER — Encounter: Payer: Medicare HMO | Admitting: *Deleted

## 2023-02-14 DIAGNOSIS — I214 Non-ST elevation (NSTEMI) myocardial infarction: Secondary | ICD-10-CM

## 2023-02-14 DIAGNOSIS — Z955 Presence of coronary angioplasty implant and graft: Secondary | ICD-10-CM

## 2023-02-14 DIAGNOSIS — Z48812 Encounter for surgical aftercare following surgery on the circulatory system: Secondary | ICD-10-CM | POA: Diagnosis not present

## 2023-02-14 NOTE — Progress Notes (Signed)
Daily Session Note  Patient Details  Name: Devin [name]. MRN: [id number] Date of Birth: [birth year] Referring Provider:   Flowsheet Row Cardiac Rehab from 11/19/2022 in [facility] Cardiac and Pulmonary Rehab  Referring Provider Dr. Adrian Blackwater MD       Encounter Date: 02/14/2023  Check In:  Session Check In - 02/14/23 1521       Check-In   Supervising physician immediately available to respond to emergencies See telemetry face sheet for immediately available ER MD    Location [facility]-Cardiac & Pulmonary Rehab    Staff Present Susann Givens, RN BSN;Joseph Hood, RCP,RRT,BSRT;Maxon [place] BS, , Exercise Physiologist;Noah Tickle, BS, Exercise Physiologist    Virtual Visit No    Medication changes reported     No    Fall or balance concerns reported    No    Warm-up and Cool-down Performed on first and last piece of equipment    Resistance Training Performed Yes    VAD Patient? No    PAD/SET Patient? No      Pain Assessment   Currently in Pain? No/denies                Social History   Tobacco Use  Smoking Status Former  Smokeless Tobacco Never  Tobacco Comments   Quit over 40 years ago    Goals Met:  Independence with exercise equipment Exercise tolerated well No report of concerns or symptoms today Strength training completed today  Goals Unmet:  Not Applicable  Comments: Pt able to follow exercise prescription today without complaint.  Will continue to monitor for progression.    Dr. Bethann Punches is Medical Director for [facility] Cardiac Rehabilitation.  Dr. Vida Rigger is Medical Director for [facility] Pulmonary Rehabilitation.

## 2023-02-18 ENCOUNTER — Encounter: Payer: Medicare HMO | Admitting: *Deleted

## 2023-02-18 ENCOUNTER — Ambulatory Visit: Payer: Medicare HMO

## 2023-02-18 DIAGNOSIS — R2689 Other abnormalities of gait and mobility: Secondary | ICD-10-CM

## 2023-02-18 DIAGNOSIS — Z48812 Encounter for surgical aftercare following surgery on the circulatory system: Secondary | ICD-10-CM | POA: Diagnosis not present

## 2023-02-18 DIAGNOSIS — Z955 Presence of coronary angioplasty implant and graft: Secondary | ICD-10-CM

## 2023-02-18 DIAGNOSIS — I214 Non-ST elevation (NSTEMI) myocardial infarction: Secondary | ICD-10-CM

## 2023-02-18 DIAGNOSIS — M6281 Muscle weakness (generalized): Secondary | ICD-10-CM | POA: Diagnosis not present

## 2023-02-18 NOTE — Therapy (Signed)
OUTPATIENT PHYSICAL THERAPY LOWER EXTREMITY TREATMENT/PROGRESS NOTE/ Re-certification through 03/18/23    Patient Name: Devin [name] [facility]. MRN: [id number] DOB:Apr 12, 1945, 77 y.o., male Today's Date: 02/13/23  END OF SESSION:  PT End of Session - 02/18/23 1210     Visit Number 28    Number of Visits 36    Date for PT Re-Evaluation 03/18/23    Authorization Type re-cert/PN done today x 4 weeks (6 additional visits)- planning to transition to HEP and DC by 03/18/23    PT Start Time 1205    PT Stop Time 1245    PT Time Calculation (min) 40 min    Equipment Utilized During Treatment Gait belt    Activity Tolerance Patient tolerated treatment well    Behavior During Therapy WFL for tasks assessed/performed                 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   Hypertension    Ischemic cardiomyopathy    MI, old    Peripheral neuropathy    PFO (patent foramen ovale) 05/2019   s/p repair   Sleep apnea    Spinal stenosis of lumbar region    TIA (transient ischemic attack)    Past Surgical History:  Procedure Laterality Date   APPENDECTOMY     BREAST SURGERY Right    benign mass   CARDIOVERSION Right 12/15/2019   Procedure: CARDIOVERSION;  Surgeon: Laurier Nancy, MD;  Location: [facility] ORS;  Service: Cardiovascular;  Laterality: Right;   CATARACT EXTRACTION, BILATERAL     COLONOSCOPY     CORONARY ANGIOPLASTY WITH STENT PLACEMENT     CORONARY ARTERY BYPASS GRAFT  05/2019   LIMA-LAD   CORONARY STENT INTERVENTION N/A 10/15/2022   Procedure: CORONARY STENT INTERVENTION;  Surgeon: Marcina Millard, MD;  Location:  [facility] INVASIVE CV LAB;  Service: Cardiovascular;  Laterality: N/A;   LEFT HEART CATH N/A 10/15/2022   Procedure: Left Heart Cath;  Surgeon: Marcina Millard, MD;  Location: [facility] INVASIVE CV LAB;  Service: Cardiovascular;  Laterality: N/A;   LEFT HEART CATH AND CORS/GRAFTS ANGIOGRAPHY N/A 05/21/2019   Procedure: LEFT HEART CATH AND CORONARY ANGIOGRAPHY;  Surgeon: Laurier Nancy, MD;  Location: [facility] INVASIVE CV LAB;  Service: Cardiovascular;  Laterality: N/A;   LUMBAR LAMINECTOMY/DECOMPRESSION MICRODISCECTOMY N/A 07/04/2020   Procedure: L4-5 LAMINECTOMY;  Surgeon: Lucy Chris, MD;  Location: [facility] ORS;  Service: Neurosurgery;  Laterality: N/A;   REPLACEMENT TOTAL KNEE BILATERAL     Patient Active Problem List   Diagnosis Date Noted   NSTEMI (non-ST elevated myocardial infarction) (HCC) 10/13/2022   HFrEF (heart failure with reduced ejection fraction) (HCC) 10/13/2022   Degenerative joint disease (DJD) of lumbar spine 10/12/2022   Frequent falls 05/15/2022   Unsteady gait 04/16/2022   Acute delirium 04/13/2022   Electrolyte abnormality 04/13/2022   Nausea & vomiting 04/13/2022   PAD (peripheral artery disease) (HCC) 09/03/2021   Localized, primary osteoarthritis 11/24/2020   Wound healing, delayed 09/08/2020   History of lumbar surgery 09/08/2020   Chronic ischemic heart disease 07/13/2020  History of MI (myocardial infarction) 07/13/2020   History of PTCA 1 07/13/2020   Other personal history presenting hazards to health 07/13/2020   Pure hypercholesterolemia 07/13/2020   Elevated lactic acid level 04/19/2020   Generalized weakness 04/09/2020   Fall at home 04/09/2020   Gastroenteritis 04/09/2020   AMS (altered mental status) 04/08/2020   Hypoxia 02/01/2020   Atrial fibrillation status post cardioversion (HCC) 12/22/2019   Congestive heart failure (CHF) (HCC) 12/13/2019   Hyponatremia 12/06/2019   Acute postoperative anemia due to expected blood loss 05/28/2019   Hyperlipidemia  05/28/2019   Ischemic cardiomyopathy 05/28/2019   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   Unstable angina (HCC) 05/24/2019   Chest pain 05/21/2019   Ischemic chest pain (HCC) 05/16/2019   CAD (coronary artery disease) 05/16/2019   HTN (hypertension) 05/16/2019   Depression 05/16/2019   GERD (gastroesophageal reflux disease) 05/16/2019   Verruca plantaris 01/23/2018   Obstructive sleep apnea syndrome 01/20/2018   Major depressive disorder, single episode, moderate (HCC) 07/05/2016   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 Zada Finders, MD  REFERRING PROVIDER: Dr. Zada Finders, MD/ [name], [facility]  REFERRING DIAG:  R26.9 (ICD-10-CM) - Unspecified abnormalities of gait and mobility  R26.89 (ICD-10-CM) - Other abnormalities of gait and mobility  R29.6 (ICD-10-CM) - Repeated falls    THERAPY DIAG:  Muscle weakness (generalized)  Balance problem  Rationale for Evaluation and Treatment: Rehabilitation  ONSET DATE: at least 2 years  SUBJECTIVE: (From initial evaluation note)  SUBJECTIVE STATEMENT: Pt reports he is having difficulty with gait and balance.  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   He feels like his balance and walking difficulty have worsened recently.  He reports he started falling frequently, he was catching his foot on things and tripping.  The most recent major fall was about 6 months ago.  He is being careful since then and trying to walk more to get stronger and trying to be more aware of his gait pattern.  He was walking with a walker until ~4 months ago, then started using a cane, and  then has tried walking without any assistance.      He feels like his difficulty walking limits his ability to get out of the house as much as he used to because he is fearful of falling.  PLOF- he enjoys getting out of the house for social activities, going out to breakfast too; going grocery shopping a few times/week to get his groceries; prior to his back surgery he walked at the park in [place], hasn't been back though because he is worried about falling though.  He has a home aide that comes a few days a week to assist with household tasks as needed.  Also reports he had a MI a little over a week ago and was in the hospital (8/3-10/16/22)- he states he is referred to begin cardiac rehab as part of his outpatient recovery.  PERTINENT HISTORY: Extensive cardiac history History of lumbar spine surgery- 2022 L4-5 microdiscectomy/laminectomy   PAIN:  Are you having pain? R groin muscle area where he had the cardiac catheterization procedure last week.  PRECAUTIONS: Fall  RED FLAGS: Pt with recent MI (10/13/22) with stent placement (see chart review)  WEIGHT BEARING RESTRICTIONS: No  FALLS:  Has patient fallen in last 6 months? Yes. Number of falls multiple  LIVING ENVIRONMENT: Lives with: lives alone (has a self reported "lady friend" for social support and enjoys being able to get out the house for community activities, going to restaurants together) Lives in: House/apartment Stairs:  he lives in a single level home with bonus room over the garage, but he does not need to go upstairs.1 step to enter in the garage- able to get in without falling. Has following equipment at home: Single point cane, Walker - 4 wheeled, and Wheelchair (manual) Has a home health aide that comes 2-3x/week if he needs assistance  OCCUPATION: retired  PLOF: Independent  PATIENT GOALS: to address drop foot and be able to manage it better; to strengthen his legs; and to increase his breathing; and improve his  balance   NEXT MD VISIT: yes, next week   OBJECTIVE:   DIAGNOSTIC FINDINGS: no recent lower extremity/spine imaging  PATIENT SURVEYS:  FOTO 39/52  COGNITION: Overall cognitive status: Within functional limits for tasks assessed     SENSATION: Pt reports reduced sensation to light touch L L4-5 dermatomes  EDEMA:  No pitting edema noted on dorsum of foot   POSTURE: pt stands with wide base of support for balance; uses UE support on LE's for sit to stand transfer  PALPATION: (-) TTP distal LE b/l  LOWER EXTREMITY ROM:  Active ROM Right eval Left eval  Hip flexion WNL WNL  Hip extension    Hip abduction    Hip adduction    Hip internal rotation    Hip external rotation    Knee flexion 105 105  Knee extension -5 -5  Ankle dorsiflexion 5 deg PROM 0 deg  Ankle plantarflexion    Great toe extension 45 deg PROM 40 deg   Ankle eversion     (Blank rows = not tested)  LOWER EXTREMITY MMT:  MMT Right eval Left eval  Hip flexion 4 4  Hip extension    Hip abduction    Hip adduction    Hip internal rotation    Hip external rotation    Knee flexion 5 5  Knee extension 4 4  Ankle dorsiflexion 4/5 1/5  Ankle plantarflexion 3/5 3/5  Great toe extension 4/5 2/5  Ankle eversion 4/5 3/5   (Blank rows = not tested)   FUNCTIONAL TESTS:  OUTCOME MEASURES: TEST Outcome Interpretation  5 times sit<>stand 21 seconds >60 yo, >15 sec indicates increased risk for falls  10 meter walk test deferred <1.0 m/s indicates increased risk for falls; limited community ambulator  [employer] Assessment /56   deferred <36/56 (100% risk for falls), 37-45 (80% risk for falls); 46-51 (>50% risk for falls); 52-55 (lower risk <25% of falls)      GAIT: Distance walked: from waiting area into PT gym 50 ft, PT supervision Assistive device utilized: None Level of assistance: Complete Independence Comments: Pt ambulates with decreased gait speed, wide BOS, decreased stride length, excess L  hip flexion strategy to compensate for L foot drop  Pt not able to stand SLS on L without UE support today; able to stand R LE x 5 seconds  TODAY'S TREATMENT:  DATE: 02/18/23 Subjective:  Pt reports his walking tolerance is improving.  His goal is to be able to amb through the airport to be able to travel independently to [place] in the near future as a close family member recently passed.  He feels like he is making progress with PT.  He has not fallen.  He lives alone though and expresses concern today as he does not know how to get up from the floor with all of his orthopedic/medical concerns, and he would like to practice this in PT before finishing tx.    Pain: 0/10  Objective: Updated goals below  HR 70 SP02% 99  Therapeutic Exercises:  While wearing L AFO, no SPC during session, gait belt and PT supervision/SBA during standing exercises  Hurdles: 6 inch height, without SPC today; 4 laps forward  Nautilus: 30 lbs resistance around trunk/pelvis Lateral walking- 4 laps R, 4 laps L Forward/reverse- 4 laps  Obstacle course: stepping over yoga blocks, on/off/balancing on airex: 6 laps- practiced without PT hand hold support 4 laps  Sit to stand 2x8 with 8# weight held at chest; 2 sets from chair with airex cushion- no use of UE; reviewed body mechanics of scooting hips forward and feet back for optimal mechanics during sit to stand  Weave in/out of cones on floor without SPC- PT supervision 4 laps to facilitate navigating obstacles in crowded room/space   Walking 5 min without SPC  Squat to pick up cones from elevated floor surface(3 inch aire) 1 at a time 2 trials of 6 cones- very difficult at end of session  Ball toss at trampoline, standing on airex (5 lb) 3x10 with PT SBA- to practice reaction to external stimuli/balance training  Lateral stepping with PT  external cue to tap R or L foot on different color cones- to practice reaction time/motor planning for balance- 4 laps along blue ladder PT SBA  Not today: Training for amb on uneven outdoor surfaces- incline/decline sidewalk; grass forward/reverse/lateral, curb situation navigation with [facility]- practice stepping up/down curb using SPC in R hand for scenarios where there may not be a railing; stepped down with L LE and pt prefers stepping up with L LE as SPC is in R hand. (5 min) Not today  Walk 1 loop around the building with PT supervision/gait belt- used his SPC, focused on navigating uneven/ramp/inclined/declined sidewalk surfaces.  No major loss of balance. Not today Side stepping forward/laterally/reverse over airex balance beam in confined small space, without UE support- to facilitate using stepping strategy for balance in crowded spaces- 6 laps in parallel bars  Side stepping ON airex without UE support in parallel bars 4 laps  Step ups onto 6 inch step: with 2 finger support ea UE; x10 lead with L LE, x10 leading with R LE   Star drill: PT verbal cue for R or L and step/weight shift forward/lateral/diagonal outside base of support then back to midline- multiple directions/rounds x 4 min with PT close supervision  Squat lift 5 cones from low surface (cones resting on 6 inch step, hand cones to PT then put cones back on the floor- 2 rounds Standing alternating toe taps to green cones: sets of 12 alternating- PT supervision with gait belt- not today  Alternating step ups on airex: x6 lead with R and x6 lead with L- in parallel bars- not today  PT SBA with gait belt: Not today High knee marches forward through blue ladder x 2 laps- not today  High knee lateral  marches through ladder x 2 laps- not today  Diagonal ladder x 2 laps- not today  Reverse walking 4x 10 m- not today  Obstacle course in parallel bars: not today Step up/on/off airex, over 6 inch hurdle, cone toe taps R/L x5,  step around cone in small space, step up/on/off airex- repeat opposite direction -Hurdles: forward direction in // bars (PT supervision with gait belt) 6 laps, focused on hip flexion strategy for foot clearance; attempted cue for "lift toes" but pt not able to actively perform active DF of ankle against gravity -Side step over hurdles in // bars: 6 laps -Airex: feet together, 3 trials x 30-40 seconds with removing hand support -Airex: tandem stance, 3 trials each R in front/L in front x 20-30 seconds removing hand support (pt notes ankle/lower leg fatigue after this) -Seated knee extension: 5# AW 2x20  -Seated marches: 5# AW 2x20  PATIENT EDUCATION:  Education details: PT POC/goals, importance of attending cardiac rehab, activity pacing Person educated: Patient Education method: Explanation Education comprehension: verbalized understanding and needs further education  HOME EXERCISE PROGRAM: Access Code: [email or web address] URL: [email or web address] Date: 10/24/2022 Prepared by: Max Fickle  Exercises - Seated Heel Raise  - 1 x daily - 3 x weekly - 2 sets - 20 reps - Seated March  - 1 x daily - 3 x weekly - 2 sets - 10 reps - Seated Long Arc Quad  - 1 x daily - 3 x weekly - 2 sets - 10 reps  ASSESSMENT:  CLINICAL IMPRESSION: Pt expresses fear of being able to get up from the floor if he falls while living at home alone.  He would like to incorporate training for how to get up/down from the floor into PT POC.  Overall, he is making progress towards goals.  Would benefit from more practice with strengthening and balance exercises that require him to respond/react to external forces to improve his safety, improve his walking endurance, and reduce fear of falling.  He is able to perform entire session today without use of SPC.  Patient will continue to benefit from skilled PT intervention to address listed impairments, to decrease fall risk and improve overall mobility and safety  during ambulation.  Likely has not met maximum recovery with skilled PT.  Planning to incorporate floor transfers into POC at next visit.  OBJECTIVE IMPAIRMENTS: Abnormal gait, cardiopulmonary status limiting activity, decreased activity tolerance, decreased balance, decreased mobility, difficulty walking, decreased ROM, and decreased strength.   ACTIVITY LIMITATIONS: standing, squatting, stairs, transfers, and locomotion level  PARTICIPATION LIMITATIONS: meal prep, cleaning, laundry, interpersonal relationship, shopping, and community activity  PERSONAL FACTORS: Age, Past/current experiences, Time since onset of injury/illness/exacerbation, and cardiovascular hx including recent MI with stent placement last week  are also affecting patient's functional outcome.   REHAB POTENTIAL: Good  CLINICAL DECISION MAKING: Stable/uncomplicated  EVALUATION COMPLEXITY: Low   GOALS: Goals reviewed with patient? Yes  SHORT TERM GOALS: Target date: 11/11/22 Pt will be able to perform a HEP for LE strengthening >3x/week Baseline: 11/27/22 pt has been instructed on a HEP; 12/24/22: met Goal status: Met   LONG TERM GOALS: Target date: 03/18/23  Improve FOTO to >51 indicating pt able to perform his daily activities without being limited by his balance; 12/24/22: in progress Baseline: 39; 10/7: 54; 01/21/23 51; 02/18/23: in progress Goal status: In Progress  2.  Pt will be able to amb with typical width BOS and b/l heel strike gait pattern using least restrictive AD and AFO on  flat/incline/decline/grass surfaces x 10 min Baseline: not using any AD or AFO in clinic, pt wide BOS, no L heel strike due to foot drop; 11/27/22 pt is amb with SPC now, discussions continue regarding AFO/brace for improved safety; 12/24/22: pt now wearing AFO and using SPC on uneven surfaces with PT SBA x 5 minutes; 01/21/23: in progress- able to amb intervals of 5-6 min x 2 with 1-2 min sitting break between Goal status: In  progress  3.  Pt will be able to perform 5x STS in <15 sec indicating reduced risk for falling Baseline: 21 sec, 11/27/22 19 sec (with mild UE use); 12/28/22: 18 seconds  (with mild UE support) 01/21/23:  19 seconds without UE support; 02/18/23 17 seconds without UE support Goal status: In Progress  4. Pt will be able to amb x 1 block distance to walk his dog 1x/day.  Baseline: 1/4-1/2 block (pt self report), 01/21/23: amb 1 loop around building in 5.25 minutes at clinic  Goal status: In progress  5. Improve 10 M walk test to >1.0 m/s to promote reduced fall risk and improved community ambulator status  Baseline: 12/24/22 .9 m/s; 01/21/23: .8 m/s (at end of session, was fatigued)  Goal status: In progress  6. Pt will independently be able to transfer stand to floor and floor to standing with use of chair/sturdy surface to simulate being able to get up from floor and to reduce fear/risk of falling  Baseline: pt has not tried and expresses fear of not being able to get up from floor living alone  Goal status: new   PLAN:  PT FREQUENCY: 1-2x/week  PT DURATION: 4 weeks  PLANNED INTERVENTIONS: Therapeutic exercises, Therapeutic activity, Neuromuscular re-education, Balance training, Gait training, Patient/Family education, Self Care, Joint mobilization, and Orthotic/Fit training  PLAN FOR NEXT SESSION: continue with proximal hip and LE strengthening, multidirectional movements/dynamic balance activities, improving confidence/reducing fear of falling through activities in small spaces and that require increased reaction speed to unpredictable external stimuli; fall recovery/floor transfers training  Max Fickle, PT, DPT, OCS  Physical Therapist - [facility]   Ardine Bjork,  02/13/23 5:34 PM

## 2023-02-18 NOTE — Progress Notes (Signed)
Daily Session Note  Patient Details  Name: Devin [name] [facility] MRN: [id number] Date of Birth: 04/26/1945 Referring Provider:   Flowsheet Row Cardiac Rehab from 11/19/2022 in [facility] Cardiac and Pulmonary Rehab  Referring Provider Dr. Adrian Blackwater MD       Encounter Date: 02/18/2023  Check In:  Session Check In - 02/18/23 1550       Check-In   Supervising physician immediately available to respond to emergencies See telemetry face sheet for immediately available ER MD    Location [facility]-Cardiac & Pulmonary Rehab    Staff Present Lanny Hurst, RN, ADN;Erinn Mendosa Jewel Baize, RN BSN;Joseph Reino Kent, RCP,RRT,BSRT    Virtual Visit No    Medication changes reported     No    Fall or balance concerns reported    No    Warm-up and Cool-down Performed on first and last piece of equipment    Resistance Training Performed Yes    VAD Patient? No    PAD/SET Patient? No      Pain Assessment   Currently in Pain? No/denies                Social History   Tobacco Use  Smoking Status Former  Smokeless Tobacco Never  Tobacco Comments   Quit over 40 years ago    Goals Met:  Independence with exercise equipment Exercise tolerated well No report of concerns or symptoms today Strength training completed today  Goals Unmet:  Not Applicable  Comments: Pt able to follow exercise prescription today without complaint.  Will continue to monitor for progression.    Dr. Bethann Punches is Medical Director for [facility] Cardiac Rehabilitation.  Dr. Vida Rigger is Medical Director for [facility] Pulmonary Rehabilitation.

## 2023-02-18 NOTE — Therapy (Signed)
OUTPATIENT PHYSICAL THERAPY LOWER EXTREMITY TREATMENT Re-certification through 02/18/23   Patient Name: Edi Heitzmann [facility]. MRN: [id number] DOB:[birth year], 77 y.o., male Today's Date: 02/13/23  END OF SESSION:  PT End of Session - 02/18/23 0953     Visit Number 27    Number of Visits 30    Date for PT Re-Evaluation 02/18/23    Authorization Type re-cert/PN done today 01/21/23 x 4 weeks (8 additional visits)    PT Start Time 1205    PT Stop Time 1245    PT Time Calculation (min) 40 min    Equipment Utilized During Treatment Gait belt    Activity Tolerance Patient tolerated treatment well    Behavior During Therapy WFL for tasks assessed/performed                 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   Hypertension    Ischemic cardiomyopathy    MI, old    Peripheral neuropathy    PFO (patent foramen ovale) 05/2019   s/p repair   Sleep apnea    Spinal stenosis of lumbar region    TIA (transient ischemic attack)    Past Surgical History:  Procedure Laterality Date   APPENDECTOMY     BREAST SURGERY Right    benign mass   CARDIOVERSION Right 12/15/2019   Procedure: CARDIOVERSION;  Surgeon: Laurier Nancy, MD;  Location: [facility] ORS;  Service: Cardiovascular;  Laterality: Right;   CATARACT EXTRACTION, BILATERAL     COLONOSCOPY     CORONARY ANGIOPLASTY WITH STENT PLACEMENT     CORONARY ARTERY BYPASS GRAFT  05/2019   LIMA-LAD   CORONARY STENT INTERVENTION N/A 10/15/2022   Procedure: CORONARY STENT INTERVENTION;  Surgeon: Marcina Millard, MD;  Location: [facility] IN[facility]IVE CV [facility];  Service: Cardiovascular;   Laterality: N/A;   LEFT HEART CATH N/A 10/15/2022   Procedure: Left Heart Cath;  Surgeon: Marcina Millard, MD;  Location: [facility] INV[facility];  Service: Cardiovascular;  Laterality: N/A;   LEFT HEART CATH AND CORS/GRAFTS ANGIOGRAPHY N/A 05/21/2019   Procedure: LEFT HEART CATH AND CORONARY ANGIOGRAPHY;  Surgeon: Laurier Nancy, MD;  Location: [facility] IN[facility] CV [facility];  Service: Cardiovascular;  Laterality: N/A;   LUMBAR LAMINECTOMY/DECOMPRESSION MICRODISCECTOMY N/A 07/04/2020   Procedure: L4-5 LAMINECTOMY;  Surgeon: Lucy Chris, MD;  Location: [facility] OR[facility];  Service: Neurosurgery;  Laterality: N/A;   REPLACEMENT TOTAL KNEE BILATERAL     Patient Active Problem List   Diagnosis Date Noted   NSTEMI (non-ST elevated myocardial infarction) (HCC) 10/13/2022   HFrEF (heart failure with reduced ejection fraction) (HCC) 10/13/2022   Degenerative joint disease (DJD) of lumbar spine 10/12/2022   Frequent falls 05/15/2022   Unsteady gait 04/16/2022   Acute delirium 04/13/2022   Electrolyte abnormality 04/13/2022   Nausea & vomiting 04/13/2022   PAD (peripheral artery disease) (HCC) 09/03/2021   Localized, primary osteoarthritis 11/24/2020   Wound healing, delayed 09/08/2020   History of lumbar surgery 09/08/2020   Chronic ischemic heart disease 07/13/2020   History of MI (myocardial infarction) 07/13/2020   History  of PTCA 1 07/13/2020   Other personal history presenting hazards to health 07/13/2020   Pure hypercholesterolemia 07/13/2020   Elevated lactic acid level 04/19/2020   Generalized weakness 04/09/2020   Fall at home 04/09/2020   Gastroenteritis 04/09/2020   AMS (altered mental status) 04/08/2020   Hypoxia 02/01/2020   Atrial fibrillation status post cardioversion (HCC) 12/22/2019   Congestive heart failure (CHF) (HCC) 12/13/2019   Hyponatremia 12/06/2019   Acute postoperative anemia due to expected blood loss 05/28/2019   Hyperlipidemia 05/28/2019   Ischemic cardiomyopathy 05/28/2019    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   Unstable angina (HCC) 05/24/2019   Chest pain 05/21/2019   Ischemic chest pain (HCC) 05/16/2019   CAD (coronary artery disease) 05/16/2019   HTN (hypertension) 05/16/2019   Depression 05/16/2019   GERD (gastroesophageal reflux disease) 05/16/2019   Verruca plantaris 01/23/2018   Obstructive sleep apnea syndrome 01/20/2018   Major depressive disorder, single episode, moderate (HCC) 07/05/2016   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 Zada Finders, MD  REFERRING PROVIDER: Dr. Zada Finders, MD/ [name], [facility]  REFERRING DIAG:  R26.9 (ICD-10-CM) - Unspecified abnormalities of gait and mobility  R26.89 (ICD-10-CM) - Other abnormalities of gait and mobility  R29.6 (ICD-10-CM) - Repeated falls    THERAPY DIAG:  Muscle weakness (generalized)  Balance problem  Rationale for Evaluation and Treatment: Rehabilitation  ONSET DATE: at least 2 years  SUBJECTIVE: (From initial evaluation note)  SUBJECTIVE STATEMENT: Pt reports he is having difficulty with gait and balance.  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   He feels like his balance and walking difficulty have worsened recently.  He reports he started falling frequently, he was catching his foot on things and tripping.  The most recent major fall was about 6 months ago.  He is being careful since then and trying to walk more to get stronger and trying to be more aware of his gait pattern.  He was walking with a walker until ~4 months ago, then started using a cane, and then has tried walking without any assistance.       He feels like his difficulty walking limits his ability to get out of the house as much as he used to because he is fearful of falling.  PLOF- he enjoys getting out of the house for social activities, going out to breakfast too; going grocery shopping a few times/week to get his groceries; prior to his back surgery he walked at the park in [place], hasn't been back though because he is worried about falling though.  He has a home aide that comes a few days a week to assist with household tasks as needed.  Also reports he had a MI a little over a week ago and was in the hospital (8/3-10/16/22)- he states he is referred to begin cardiac rehab as part of his outpatient recovery.  PERTINENT HISTORY: Extensive cardiac history History of lumbar spine surgery- 2022 L4-5 microdiscectomy/laminectomy   PAIN:  Are you having pain? R groin muscle area where he had the cardiac catheterization procedure last week.     PRECAUTIONS: Fall  RED FLAGS: Pt  with recent MI (10/13/22) with stent placement (see chart review)  WEIGHT BEARING RESTRICTIONS: No  FALLS:  Has patient fallen in last 6 months? Yes. Number of falls multiple  LIVING ENVIRONMENT: Lives with: lives alone (has a self reported "lady friend" for social support and enjoys being able to get out the house for community activities, going to restaurants together) Lives in: House/apartment Stairs:  he lives in a single level home with bonus room over the garage, but he does not need to go upstairs.1 step to enter in the garage- able to get in without falling. Has following equipment at home: Single point cane, Walker - 4 wheeled, and Wheelchair (manual) Has a home health aide that comes 2-3x/week if he needs assistance  OCCUPATION: retired  PLOF: Independent  PATIENT GOALS: to address drop foot and be able to manage it better; to strengthen his legs; and to increase his breathing; and improve his balance   NEXT MD VISIT: yes, next week    OBJECTIVE:   DIAGNOSTIC FINDINGS: no recent lower extremity/spine imaging  PATIENT SURVEYS:  FOTO 39/52  COGNITION: Overall cognitive status: Within functional limits for tasks assessed     SENSATION: Pt reports reduced sensation to light touch L L4-5 dermatomes  EDEMA:  No pitting edema noted on dorsum of foot   POSTURE: pt stands with wide base of support for balance; uses UE support on LE's for sit to stand transfer  PALPATION: (-) TTP distal LE b/l  LOWER EXTREMITY ROM:  Active ROM Right eval Left eval  Hip flexion WNL WNL  Hip extension    Hip abduction    Hip adduction    Hip internal rotation    Hip external rotation    Knee flexion 105 105  Knee extension -5 -5  Ankle dorsiflexion 5 deg PROM 0 deg  Ankle plantarflexion    Great toe extension 45 deg PROM 40 deg   Ankle eversion     (Blank rows = not tested)  LOWER EXTREMITY MMT:  MMT Right eval Left eval  Hip flexion 4 4  Hip extension    Hip abduction    Hip adduction    Hip internal rotation    Hip external rotation    Knee flexion 5 5  Knee extension 4 4  Ankle dorsiflexion 4/5 1/5  Ankle plantarflexion 3/5 3/5  Great toe extension 4/5 2/5  Ankle eversion 4/5 3/5   (Blank rows = not tested)   FUNCTIONAL TESTS:  OUTCOME MEASURES: TEST Outcome Interpretation  5 times sit<>stand 21 seconds >60 yo, >15 sec indicates increased risk for falls  10 meter walk test deferred <1.0 m/s indicates increased risk for falls; limited community ambulator  [employer] Assessment /56   deferred <36/56 (100% risk for falls), 37-45 (80% risk for falls); 46-51 (>50% risk for falls); 52-55 (lower risk <25% of falls)      GAIT: Distance walked: from waiting area into PT gym 50 ft, PT supervision Assistive device utilized: None Level of assistance: Complete Independence Comments: Pt ambulates with decreased gait speed, wide BOS, decreased stride length, excess L hip flexion strategy to compensate for L  foot drop  Pt not able to stand SLS on L without UE support today; able to stand R LE x 5 seconds  TODAY'S TREATMENT:  DATE: 02/13/23 Subjective:  Pt with no new complaints; he continues to work on walking without his cane this week; no falls since last session.  Doing PT and cardiac rehab on the same day was very tiring for him.  Pain: 0/10  Objective: HR 70 SP02% 99  Therapeutic Exercises:  While wearing L AFO, no SPC during session, gait belt and PT supervision/SBA during standing exercises  Hurdles: 6 inch height, without SPC today; 4 laps forward  Nautilus: 30 lbs resistance around trunk/pelvis Lateral walking- 4 laps R, 4 laps L Forward/reverse- 4 laps  Obstacle course: stepping over yoga blocks, on/off/balancing on airex: 6 laps- practiced without PT hand hold support 4 laps  Sit to stand 2x8 with 8# weight held at chest; 2 sets from chair with airex cushion- no use of UE; reviewed body mechanics of scooting hips forward and feet back for optimal mechanics during sit to stand  Weave in/out of cones on floor without SPC- PT supervision 4 laps to facilitate navigating obstacles in crowded room/space   Walking 5 min without SPC  Squat to pick up cones from elevated floor surface(3 inch aire) 1 at a time 2 trials of 6 cones- very difficult at end of session  Not today: Training for amb on uneven outdoor surfaces- incline/decline sidewalk; grass forward/reverse/lateral, curb situation navigation with [facility]- practice stepping up/down curb using SPC in R hand for scenarios where there may not be a railing; stepped down with L LE and pt prefers stepping up with L LE as SPC is in R hand. (5 min) Not today  Walk 1 loop around the building with PT supervision/gait belt- used his SPC, focused on navigating uneven/ramp/inclined/declined sidewalk surfaces.  No major  loss of balance. Not today Side stepping forward/laterally/reverse over airex balance beam in confined small space, without UE support- to facilitate using stepping strategy for balance in crowded spaces- 6 laps in parallel bars  Side stepping ON airex without UE support in parallel bars 4 laps  Step ups onto 6 inch step: with 2 finger support ea UE; x10 lead with L LE, x10 leading with R LE   Star drill: PT verbal cue for R or L and step/weight shift forward/lateral/diagonal outside base of support then back to midline- multiple directions/rounds x 4 min with PT close supervision  Squat lift 5 cones from low surface (cones resting on 6 inch step, hand cones to PT then put cones back on the floor- 2 rounds Standing alternating toe taps to green cones: sets of 12 alternating- PT supervision with gait belt- not today  Alternating step ups on airex: x6 lead with R and x6 lead with L- in parallel bars- not today  PT SBA with gait belt: Not today High knee marches forward through blue ladder x 2 laps- not today  High knee lateral marches through ladder x 2 laps- not today  Diagonal ladder x 2 laps- not today  Reverse walking 4x 10 m- not today  Obstacle course in parallel bars: not today Step up/on/off airex, over 6 inch hurdle, cone toe taps R/L x5, step around cone in small space, step up/on/off airex- repeat opposite direction -Hurdles: forward direction in // bars (PT supervision with gait belt) 6 laps, focused on hip flexion strategy for foot clearance; attempted cue for "lift toes" but pt not able to actively perform active DF of ankle against gravity -Side step over hurdles in // bars: 6 laps -Airex: feet together, 3 trials x 30-40 seconds with removing  hand support -Airex: tandem stance, 3 trials each R in front/L in front x 20-30 seconds removing hand support (pt notes ankle/lower leg fatigue after this) -Seated knee extension: 5# AW 2x20  -Seated marches: 5# AW 2x20  PATIENT  EDUCATION:  Education details: PT POC/goals, importance of attending cardiac rehab, activity pacing Person educated: Patient Education method: Explanation Education comprehension: verbalized understanding and needs further education  HOME EXERCISE PROGRAM: Access Code: [email or web address] URL: [email or web address] Date: 10/24/2022 Prepared by: Max Fickle  Exercises - Seated Heel Raise  - 1 x daily - 3 x weekly - 2 sets - 20 reps - Seated March  - 1 x daily - 3 x weekly - 2 sets - 10 reps - Seated Long Arc Quad  - 1 x daily - 3 x weekly - 2 sets - 10 reps  ASSESSMENT:  CLINICAL IMPRESSION: Pt was challenged with nautilus exercise today; had difficulty maintaining balance while controlling resistance during R>L  eccentric side step/weight acceptance.  Would benefit from more practice with strengthening and balance exercises that require him to respond/react to external forces to improve his safety, improve his walking endurance, and reduce fear of falling.  He is able to perform entire session today without use of SPC.  Navigating hurdles without UE support is an improvement since starting PT. Also able to progress depth of squat/pick up to lower surface today.  Overall, this reflects improvement in balance and strength made with PT.  Patient will continue to benefit from skilled PT intervention to address listed impairments, to decrease fall risk and improve overall mobility and safety during ambulation.  Likely has not met maximum recovery with skilled PT.  OBJECTIVE IMPAIRMENTS: Abnormal gait, cardiopulmonary status limiting activity, decreased activity tolerance, decreased balance, decreased mobility, difficulty walking, decreased ROM, and decreased strength.   ACTIVITY LIMITATIONS: standing, squatting, stairs, transfers, and locomotion level  PARTICIPATION LIMITATIONS: meal prep, cleaning, laundry, interpersonal relationship, shopping, and community activity  PERSONAL FACTORS:  Age, Past/current experiences, Time since onset of injury/illness/exacerbation, and cardiovascular hx including recent MI with stent placement last week  are also affecting patient's functional outcome.   REHAB POTENTIAL: Good  CLINICAL DECISION MAKING: Stable/uncomplicated  EVALUATION COMPLEXITY: Low   GOALS: Goals reviewed with patient? Yes  SHORT TERM GOALS: Target date: 11/11/22 Pt will be able to perform a HEP for LE strengthening >3x/week Baseline: 11/27/22 pt has been instructed on a HEP; 12/24/22: met Goal status: Met   LONG TERM GOALS: Target date: 02/18/23  Improve FOTO to >51 indicating pt able to perform his daily activities without being limited by his balance; 12/24/22: in progress Baseline: 39; 10/7: 54; 01/21/23 51 Goal status: In Progress  2.  Pt will be able to amb with typical width BOS and b/l heel strike gait pattern using least restrictive AD and AFO on flat/incline/decline/grass surfaces x 10 min Baseline: not using any AD or AFO in clinic, pt wide BOS, no L heel strike due to foot drop; 11/27/22 pt is amb with SPC now, discussions continue regarding AFO/brace for improved safety; 12/24/22: pt now wearing AFO and using SPC on uneven surfaces with PT SBA x 5 minutes; 01/21/23: in progress- able to amb intervals of 5-6 min x 2 with 1-2 min sitting break between Goal status: In progress  3.  Pt will be able to perform 5x STS in <15 sec indicating reduced risk for falling Baseline: 21 sec, 11/27/22 19 sec (with mild UE use); 12/28/22: 18 seconds  (with mild UE support) 01/21/23:  19 seconds without UE support Goal status: In Progress  4. Pt will be able to amb x 1 block distance to walk his dog 1x/day.  Baseline: 1/4-1/2 block (pt self report), 01/21/23: amb 1 loop around building in 5.25 minutes at clinic  Goal status: In progress  5. Improve 10 M walk test to >1.0 m/s to promote reduced fall risk and improved community ambulator status  Baseline: 12/24/22 .9 m/s;  01/21/23: .8 m/s (at end of session, was fatigued)  Goal status: In progress  PLAN:  PT FREQUENCY: 2x/week  PT DURATION: 4 weeks  PLANNED INTERVENTIONS: Therapeutic exercises, Therapeutic activity, Neuromuscular re-education, Balance training, Gait training, Patient/Family education, Self Care, Joint mobilization, and Orthotic/Fit training  PLAN FOR NEXT SESSION: continue with proximal hip and LE strengthening, multidirectional movements/dynamic balance activities, improving confidence/reducing fear of falling through activities in small spaces and that require increased reaction speed to unpredictable external stimuli; progress note/reassess goals at next visit  Max Fickle, PT, DPT, OCS  Physical Therapist - [facility]   Ardine Bjork, PT 02/13/23 9:53 AM

## 2023-02-21 ENCOUNTER — Encounter: Payer: Medicare HMO | Admitting: *Deleted

## 2023-02-21 DIAGNOSIS — I214 Non-ST elevation (NSTEMI) myocardial infarction: Secondary | ICD-10-CM

## 2023-02-21 DIAGNOSIS — Z48812 Encounter for surgical aftercare following surgery on the circulatory system: Secondary | ICD-10-CM | POA: Diagnosis not present

## 2023-02-21 DIAGNOSIS — Z955 Presence of coronary angioplasty implant and graft: Secondary | ICD-10-CM

## 2023-02-21 LAB — GLUCOSE, CAPILLARY
Glucose-Capillary: 56 mg/dL — ABNORMAL LOW (ref 70–99)
Glucose-Capillary: 72 mg/dL (ref 70–99)

## 2023-02-21 NOTE — Progress Notes (Signed)
Daily Session Note  Patient Details  Name: Devin [name] [facility]. MRN: [id number] Date of Birth: [birth year] Referring Provider:   Flowsheet Row Cardiac Rehab from 11/19/2022 in [facility] Cardiac [facility] Pulmonary [facility]hab  Referring Provider Dr. Adrian Blackwater MD       Encounter Date: 02/21/2023  Check In:  Session Check In - 02/21/23 1541       Check-In   Supervising physician immediately available to respond to emergencies See telemetry face sheet for immediately available ER MD    Location [facility]-Cardiac & Pulmonary Rehab    Staff Present Susann Givens, RN BSN;Joseph Hood, RCP,RRT,BSRT;Maxon [place] BS, , Exercise Physiologist;Megan Katrinka Blazing, RN, [place]    Virtual Visit No    Medication changes reported     No    Fall or balance concerns reported    No    Warm-up and Cool-down Performed on first and last piece of equipment    Resistance Training Performed Yes    VAD Patient? No    PAD/SET Patient? No      Pain Assessment   Currently in Pain? No/denies                Social History   Tobacco Use  Smoking Status Former  Smokeless Tobacco Never  Tobacco Comments   Quit over 40 years ago    Goals Met:  Independence with exercise equipment Exercise tolerated well No report of concerns or symptoms today Strength training completed today  Goals Unmet:  Not Applicable  Comments: Pt able to follow exercise prescription today without complaint.  Will continue to monitor for progression.    Dr. Bethann Punches is Medical Director for [facility] Cardiac Rehabilitation.  Dr. Vida Rigger is Medical Director for [facility] Pulmonary Rehabilitation.

## 2023-02-25 ENCOUNTER — Ambulatory Visit: Payer: Medicare HMO

## 2023-02-25 DIAGNOSIS — R2689 Other abnormalities of gait and mobility: Secondary | ICD-10-CM

## 2023-02-25 DIAGNOSIS — M6281 Muscle weakness (generalized): Secondary | ICD-10-CM

## 2023-02-25 NOTE — Therapy (Signed)
OUTPATIENT PHYSICAL THERAPY LOWER EXTREMITY TREATMENT/PROGRESS NOTE/ Re-certification through 03/18/23    Patient Name: Devin [name]. MRN: [id number] DOB:[birth year], 77 y.o., male Today's Date: 02/13/23  END OF SESSION:  PT End of Session - 02/25/23 1728     Visit Number 29    Number of Visits 36    Date for PT Re-Evaluation 03/18/23    Authorization Type re-cert/PN done today x 4 weeks (6 additional visits)- planning to transition to HEP and DC by 03/18/23    PT Start Time 1205    PT Stop Time 1246    PT Time Calculation (min) 41 min    Equipment Utilized During Treatment Gait belt    Activity Tolerance Patient tolerated treatment well    Behavior During Therapy WFL for tasks assessed/performed                 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   Hypertension    Ischemic cardiomyopathy    MI, old    Peripheral neuropathy    PFO (patent foramen ovale) 05/2019   s/p repair   Sleep apnea    Spinal stenosis of lumbar region    TIA (transient ischemic attack)    Past Surgical History:  Procedure Laterality Date   APPENDECTOMY     BREAST SURGERY Right    benign mass   CARDIOVERSION Right 12/15/2019   Procedure: CARDIOVERSION;  Surgeon: Laurier Nancy, MD;  Location: [facility];  Service: Cardiovascular;  Laterality: Right;   CATARACT EXTRACTION, BILATERAL     COLONOSCOPY     CORONARY ANGIOPLASTY WITH STENT PLACEMENT     CORONARY ARTERY BYPASS GRAFT  05/2019   LIMA-LAD   CORONARY STENT INTERVENTION N/A 10/15/2022   Procedure: CORONARY STENT INTERVENTION;  Surgeon: Marcina Millard, MD;  Location:  [facility];  Service: Cardiovascular;  Laterality: N/A;   LEFT HEART CATH N/A 10/15/2022   Procedure: Left Heart Cath;  Surgeon: Marcina Millard, MD;  Location: [facility];  Service: Cardiovascular;  Laterality: N/A;   LEFT HEART CATH AND CORS/GRAFTS ANGIOGRAPHY N/A 05/21/2019   Procedure: LEFT HEART CATH AND CORONARY ANGIOGRAPHY;  Surgeon: Laurier Nancy, MD;  Location: [facility];  Service: Cardiovascular;  Laterality: N/A;   LUMBAR LAMINECTOMY/DECOMPRESSION MICRODISCECTOMY N/A 07/04/2020   Procedure: L4-5 LAMINECTOMY;  Surgeon: Lucy Chris, MD;  Location: [facility];  Service: Neurosurgery;  Laterality: N/A;   REPLACEMENT TOTAL KNEE BILATERAL     Patient Active Problem List   Diagnosis Date Noted   NSTEMI (non-ST elevated myocardial infarction) (HCC) 10/13/2022   HFrEF (heart failure with reduced ejection fraction) (HCC) 10/13/2022   Degenerative joint disease (DJD) of lumbar spine 10/12/2022   Frequent falls 05/15/2022   Unsteady gait 04/16/2022   Acute delirium 04/13/2022   Electrolyte abnormality 04/13/2022   Nausea & vomiting 04/13/2022   PAD (peripheral artery disease) (HCC) 09/03/2021   Localized, primary osteoarthritis 11/24/2020   Wound healing, delayed 09/08/2020   History of lumbar surgery 09/08/2020   Chronic ischemic heart disease 07/13/2020  History of MI (myocardial infarction) 07/13/2020   History of PTCA 1 07/13/2020   Other personal history presenting hazards to health 07/13/2020   Pure hypercholesterolemia 07/13/2020   Elevated lactic acid level 04/19/2020   Generalized weakness 04/09/2020   Fall at home 04/09/2020   Gastroenteritis 04/09/2020   AMS (altered mental status) 04/08/2020   Hypoxia 02/01/2020   Atrial fibrillation status post cardioversion (HCC) 12/22/2019   Congestive heart failure (CHF) (HCC) 12/13/2019   Hyponatremia 12/06/2019   Acute postoperative anemia due to expected blood loss 05/28/2019   Hyperlipidemia  05/28/2019   Ischemic cardiomyopathy 05/28/2019   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   Unstable angina (HCC) 05/24/2019   Chest pain 05/21/2019   Ischemic chest pain (HCC) 05/16/2019   CAD (coronary artery disease) 05/16/2019   HTN (hypertension) 05/16/2019   Depression 05/16/2019   GERD (gastroesophageal reflux disease) 05/16/2019   Verruca plantaris 01/23/2018   Obstructive sleep apnea syndrome 01/20/2018   Major depressive disorder, single episode, moderate (HCC) 07/05/2016   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 Zada Finders, MD  REFERRING PROVIDER: Dr. Zada Finders, MD/ [name], [name]  REFERRING DIAG:  R26.9 (ICD-10-CM) - Unspecified abnormalities of gait and mobility  R26.89 (ICD-10-CM) - Other abnormalities of gait and mobility  R29.6 (ICD-10-CM) - Repeated falls    THERAPY DIAG:  Muscle weakness (generalized)  Balance problem  Other abnormalities of gait and mobility  Rationale for Evaluation and Treatment: Rehabilitation  ONSET DATE: at least 2 years  SUBJECTIVE: (From initial evaluation note)  SUBJECTIVE STATEMENT: Pt reports he is having difficulty with gait and balance.  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   He feels like his balance and walking difficulty have worsened recently.  He reports he started falling frequently, he was catching his foot on things and tripping.  The most recent major fall was about 6 months ago.  He is being careful since then and trying to walk more to get stronger and trying to be more aware of his gait pattern.  He was walking with a walker until ~4  months ago, then started using a cane, and then has tried walking without any assistance.      He feels like his difficulty walking limits his ability to get out of the house as much as he used to because he is fearful of falling.  PLOF- he enjoys getting out of the house for social activities, going out to breakfast too; going grocery shopping a few times/week to get his groceries; prior to his back surgery he walked at the park in [place], hasn't been back though because he is worried about falling though.  He has a home aide that comes a few days a week to assist with household tasks as needed.  Also reports he had a MI a little over a week ago and was in the hospital (8/3-10/16/22)- he states he is referred to begin cardiac rehab as part of his outpatient recovery.  PERTINENT HISTORY: Extensive cardiac history History of lumbar spine surgery- 2022 L4-5 microdiscectomy/laminectomy   PAIN:  Are you having pain? R groin muscle area where he had  the cardiac catheterization procedure last week.     PRECAUTIONS: Fall  RED FLAGS: Pt with recent MI (10/13/22) with stent placement (see chart review)  WEIGHT BEARING RESTRICTIONS: No  FALLS:  Has patient fallen in last 6 months? Yes. Number of falls multiple  LIVING ENVIRONMENT: Lives with: lives alone (has a self reported "lady friend" for social support and enjoys being able to get out the house for community activities, going to restaurants together) Lives in: House/apartment Stairs:  he lives in a single level home with bonus room over the garage, but he does not need to go upstairs.1 step to enter in the garage- able to get in without falling. Has following equipment at home: Single point cane, Walker - 4 wheeled, and Wheelchair (manual) Has a home health aide that comes 2-3x/week if he needs assistance  OCCUPATION: retired  PLOF: Independent  PATIENT GOALS: to address drop foot and be able to manage it better; to strengthen his legs; and  to increase his breathing; and improve his balance   NEXT MD VISIT: yes, next week   OBJECTIVE:   DIAGNOSTIC FINDINGS: no recent lower extremity/spine imaging  PATIENT SURVEYS:  FOTO 39/52  COGNITION: Overall cognitive status: Within functional limits for tasks assessed     SENSATION: Pt reports reduced sensation to light touch L L4-5 dermatomes  EDEMA:  No pitting edema noted on dorsum of foot   POSTURE: pt stands with wide base of support for balance; uses UE support on LE's for sit to stand transfer  PALPATION: (-) TTP distal LE b/l  LOWER EXTREMITY ROM:  Active ROM Right eval Left eval  Hip flexion WNL WNL  Hip extension    Hip abduction    Hip adduction    Hip internal rotation    Hip external rotation    Knee flexion 105 105  Knee extension -5 -5  Ankle dorsiflexion 5 deg PROM 0 deg  Ankle plantarflexion    Great toe extension 45 deg PROM 40 deg   Ankle eversion     (Blank rows = not tested)  LOWER EXTREMITY MMT:  MMT Right eval Left eval  Hip flexion 4 4  Hip extension    Hip abduction    Hip adduction    Hip internal rotation    Hip external rotation    Knee flexion 5 5  Knee extension 4 4  Ankle dorsiflexion 4/5 1/5  Ankle plantarflexion 3/5 3/5  Great toe extension 4/5 2/5  Ankle eversion 4/5 3/5   (Blank rows = not tested)   FUNCTIONAL TESTS:  OUTCOME MEASURES: TEST Outcome Interpretation  5 times sit<>stand 21 seconds >60 yo, >15 sec indicates increased risk for falls  10 meter walk test deferred <1.0 m/s indicates increased risk for falls; limited community ambulator  [employer] Assessment /56   deferred <36/56 (100% risk for falls), 37-45 (80% risk for falls); 46-51 (>50% risk for falls); 52-55 (lower risk <25% of falls)      GAIT: Distance walked: from waiting area into PT gym 50 ft, PT supervision Assistive device utilized: None Level of assistance: Complete Independence Comments: Pt ambulates with decreased gait speed,  wide BOS, decreased stride length, excess L hip flexion strategy to compensate for L foot drop  Pt not able to stand SLS on L without UE support today; able to stand R LE x 5 seconds  TODAY'S TREATMENT:  DATE: 02/25/23 Subjective:  Pt reports he has not fallen.  Expresses concerns about noticing a significant amount of weight gain the past few months- plans to discuss with his PCP at his appointment tomorrow.    Pain: 0/10  Objective: Updated goals below  HR 70 SP02% 99  Therapeutic Exercises:  While wearing L AFO, no SPC during session, gait belt and PT supervision/SBA during standing exercises Seated knee extension: 5# AW 2x20   Seated marches: 5# AW 2x20  Hurdles: 6 inch height, without SPC today; 4 laps forward- not today  Nautilus: 30 lbs resistance around trunk/pelvis- not today Lateral walking- 4 laps R, 4 laps L Forward/reverse- 4 laps  Obstacle course: stepping over yoga blocks, on/off/balancing on airex: 6 laps- practiced without PT hand hold support 4 laps  Amb CW/CCW 2 laps in gym each while tossing ball/catch ball vertically (3 # med ball) dual tasking; PT SBA  Amb tossing ball R/L to PT while amb CW and CWW 2 laps each in gym (rainbow ball) dual tasking; PT SBA  Sit to stand 2x8 with 8# weight held at chest; 2 sets from chair with airex cushion- no use of UE; reviewed body mechanics of scooting hips forward and feet back for optimal mechanics during sit to stand  Weave in/out of cones on floor without SPC- PT supervision 4 laps to facilitate navigating obstacles in crowded room/space   Walking 5 min without SPC  Squat to pick up cones from elevated floor surface(3 inch aire) 1 at a time 2 trials of 6 cones- very difficult at end of session- not today  Ball toss at trampoline, standing on airex (5 lb) 3x10 with PT SBA- to practice reaction  to external stimuli/balance training  Lateral stepping with PT external cue to tap R or L foot on different color cones- to practice reaction time/motor planning for balance- 4 laps along blue ladder PT SBA- not today  Front step ups: R LE 2x6, L LE 2x6 with min UE support   Not today: Training for amb on uneven outdoor surfaces- incline/decline sidewalk; grass forward/reverse/lateral, curb situation navigation with [facility]- practice stepping up/down curb using SPC in R hand for scenarios where there may not be a railing; stepped down with L LE and pt prefers stepping up with L LE as SPC is in R hand. (5 min) Not today  Walk 1 loop around the building with PT supervision/gait belt- used his SPC, focused on navigating uneven/ramp/inclined/declined sidewalk surfaces.  No major loss of balance. Not today Side stepping forward/laterally/reverse over airex balance beam in confined small space, without UE support- to facilitate using stepping strategy for balance in crowded spaces- 6 laps in parallel bars  Side stepping ON airex without UE support in parallel bars 4 laps  Step ups onto 6 inch step: with 2 finger support ea UE; x10 lead with L LE, x10 leading with R LE   Star drill: PT verbal cue for R or L and step/weight shift forward/lateral/diagonal outside base of support then back to midline- multiple directions/rounds x 4 min with PT close supervision  Squat lift 5 cones from low surface (cones resting on 6 inch step, hand cones to PT then put cones back on the floor- 2 rounds Standing alternating toe taps to green cones: sets of 12 alternating- PT supervision with gait belt- not today  Alternating step ups on airex: x6 lead with R and x6 lead with L- in parallel bars- not today  PT SBA with gait  belt: Not today High knee marches forward through blue ladder x 2 laps- not today  High knee lateral marches through ladder x 2 laps- not today  Diagonal ladder x 2 laps- not today  Reverse  walking 4x 10 m- not today  Obstacle course in parallel bars: not today Step up/on/off airex, over 6 inch hurdle, cone toe taps R/L x5, step around cone in small space, step up/on/off airex- repeat opposite direction -Hurdles: forward direction in // bars (PT supervision with gait belt) 6 laps, focused on hip flexion strategy for foot clearance; attempted cue for "lift toes" but pt not able to actively perform active DF of ankle against gravity -Side step over hurdles in // bars: 6 laps -Airex: feet together, 3 trials x 30-40 seconds with removing hand support -Airex: tandem stance, 3 trials each R in front/L in front x 20-30 seconds removing hand support (pt notes ankle/lower leg fatigue after this)   PATIENT EDUCATION:  Education details: PT POC/goals, importance of attending cardiac rehab, activity pacing Person educated: Patient Education method: Explanation Education comprehension: verbalized understanding and needs further education  HOME EXERCISE PROGRAM: Access Code: [email or web address] URL: [email or web address] Date: 10/24/2022 Prepared by: Max Fickle  Exercises - Seated Heel Raise  - 1 x daily - 3 x weekly - 2 sets - 20 reps - Seated March  - 1 x daily - 3 x weekly - 2 sets - 10 reps - Seated Long Arc Quad  - 1 x daily - 3 x weekly - 2 sets - 10 reps  ASSESSMENT:  CLINICAL IMPRESSION: Pt challenged appropriately with dual tasking dynamic gait activities today; no loss of balance noted.  LE fatigue at end of session noted- too fatigued for transfer training to/from floor.  Will plan to start session with transfer training instead of at end.  He is able to perform entire session today without use of SPC.  Patient will continue to benefit from skilled PT intervention to address listed impairments, to decrease fall risk and improve overall mobility and safety during ambulation.  Likely has not met maximum recovery with skilled PT.    OBJECTIVE IMPAIRMENTS: Abnormal  gait, cardiopulmonary status limiting activity, decreased activity tolerance, decreased balance, decreased mobility, difficulty walking, decreased ROM, and decreased strength.   ACTIVITY LIMITATIONS: standing, squatting, stairs, transfers, and locomotion level  PARTICIPATION LIMITATIONS: meal prep, cleaning, laundry, interpersonal relationship, shopping, and community activity  PERSONAL FACTORS: Age, Past/current experiences, Time since onset of injury/illness/exacerbation, and cardiovascular hx including recent MI with stent placement last week  are also affecting patient's functional outcome.   REHAB POTENTIAL: Good  CLINICAL DECISION MAKING: Stable/uncomplicated  EVALUATION COMPLEXITY: Low   GOALS: Goals reviewed with patient? Yes  SHORT TERM GOALS: Target date: 11/11/22 Pt will be able to perform a HEP for LE strengthening >3x/week Baseline: 11/27/22 pt has been instructed on a HEP; 12/24/22: met Goal status: Met   LONG TERM GOALS: Target date: 03/18/23  Improve FOTO to >51 indicating pt able to perform his daily activities without being limited by his balance; 12/24/22: in progress Baseline: 39; 10/7: 54; 01/21/23 51; 02/18/23: in progress Goal status: In Progress  2.  Pt will be able to amb with typical width BOS and b/l heel strike gait pattern using least restrictive AD and AFO on flat/incline/decline/grass surfaces x 10 min Baseline: not using any AD or AFO in clinic, pt wide BOS, no L heel strike due to foot drop; 11/27/22 pt is amb with SPC now, discussions continue regarding AFO/brace  for improved safety; 12/24/22: pt now wearing AFO and using SPC on uneven surfaces with PT SBA x 5 minutes; 01/21/23: in progress- able to amb intervals of 5-6 min x 2 with 1-2 min sitting break between Goal status: In progress  3.  Pt will be able to perform 5x STS in <15 sec indicating reduced risk for falling Baseline: 21 sec, 11/27/22 19 sec (with mild UE use); 12/28/22: 18 seconds  (with mild  UE support) 01/21/23:  19 seconds without UE support; 02/18/23 17 seconds without UE support Goal status: In Progress  4. Pt will be able to amb x 1 block distance to walk his dog 1x/day.  Baseline: 1/4-1/2 block (pt self report), 01/21/23: amb 1 loop around building in 5.25 minutes at clinic  Goal status: In progress  5. Improve 10 M walk test to >1.0 m/s to promote reduced fall risk and improved community ambulator status  Baseline: 12/24/22 .9 m/s; 01/21/23: .8 m/s (at end of session, was fatigued)  Goal status: In progress  6. Pt will independently be able to transfer stand to floor and floor to standing with use of chair/sturdy surface to simulate being able to get up from floor and to reduce fear/risk of falling  Baseline: pt has not tried and expresses fear of not being able to get up from floor living alone  Goal status: new   PLAN:  PT FREQUENCY: 1-2x/week  PT DURATION: 4 weeks  PLANNED INTERVENTIONS: Therapeutic exercises, Therapeutic activity, Neuromuscular re-education, Balance training, Gait training, Patient/Family education, Self Care, Joint mobilization, and Orthotic/Fit training  PLAN FOR NEXT SESSION: continue with proximal hip and LE strengthening, multidirectional movements/dynamic balance activities, improving confidence/reducing fear of falling through activities in small spaces and that require increased reaction speed to unpredictable external stimuli; fall recovery/floor transfers training  Max Fickle, PT, DPT, OCS  Physical Therapist - [facility]   Vinnie Langton [place], [place] 02/13/23 5:29 PM

## 2023-02-27 ENCOUNTER — Ambulatory Visit: Payer: Medicare HMO

## 2023-02-27 ENCOUNTER — Encounter: Payer: Self-pay | Admitting: *Deleted

## 2023-02-27 DIAGNOSIS — Z955 Presence of coronary angioplasty implant and graft: Secondary | ICD-10-CM

## 2023-02-27 DIAGNOSIS — I214 Non-ST elevation (NSTEMI) myocardial infarction: Secondary | ICD-10-CM

## 2023-02-27 NOTE — Progress Notes (Signed)
Cardiac Individual Treatment Plan  Patient Details  Name: Devin [name] [facility]. MRN: [id number] Date of Birth: 10-Apr-1945 Referring Provider:   Flowsheet Row Cardiac Rehab from 11/19/2022 in [facility] Cardiac and Pulmonary Rehab  Referring Provider Dr. Adrian Blackwater MD       Initial Encounter Date:  Flowsheet Row Cardiac Rehab from 11/19/2022 in [facility] Cardiac and Pulmonary Rehab  Date 11/19/22       Visit Diagnosis: NSTEMI (non-ST elevation myocardial infarction) [facility])  Status post coronary artery stent placement  Patient's Home Medications on Admission:  Current Outpatient Medications:    albuterol (VENTOLIN HFA) 108 (90 Base) MCG/ACT inhaler, Inhale 2 puffs into the lungs every 6 (six) hours as needed for wheezing or shortness of breath., Disp: , Rfl:    amiodarone (PACERONE) 200 MG tablet, Take 200 mg by mouth daily., Disp: , Rfl:    amLODipine (NORVASC) 10 MG tablet, Take 1 tablet by mouth daily., Disp: , Rfl:    apixaban (ELIQUIS) 5 MG TABS tablet, Take 1 tablet (5 mg total) by mouth 2 (two) times daily., Disp: 60 tablet, Rfl: 3   atorvastatin (LIPITOR) 80 MG tablet, Take 80 mg by mouth daily., Disp: , Rfl:    buPROPion (WELLBUTRIN XL) 300 MG 24 hr tablet, Take 300 mg by mouth daily., Disp: , Rfl:    cephALEXin (KEFLEX) 500 MG capsule, Take 500 mg by mouth 3 (three) times daily., Disp: , Rfl:    clopidogrel (PLAVIX) 75 MG tablet, Take 1 tablet (75 mg total) by mouth daily with breakfast., Disp: 30 tablet, Rfl: 11   docusate (COLACE) 60 MG/15ML syrup, Take 60 mg by mouth daily., Disp: , Rfl:    EPINEPHrine 0.3 mg/0.3 mL IJ SOAJ injection, Inject 0.3 mg into the muscle as needed for anaphylaxis., Disp: , Rfl:    ezetimibe (ZETIA) 10 MG tablet, Take 10 mg by mouth daily., Disp: , Rfl:    fluticasone (FLONASE) 50 MCG/ACT nasal spray, SPRAY 2 SPRAYS INTO EACH NOSTRIL EVERY DAY, Disp: , Rfl:    gabapentin (NEURONTIN) 300 MG capsule, Take 300 mg by mouth 2 (two) times daily., Disp: , Rfl:     loratadine (CLARITIN) 10 MG tablet, Take 10 mg by mouth daily., Disp: , Rfl:    metFORMIN (GLUCOPHAGE) 500 MG tablet, Take 1,000 mg by mouth 2 (two) times daily., Disp: , Rfl:    metoprolol succinate (TOPROL XL) 50 MG 24 hr tablet, Take 1 tablet (50 mg total) by mouth daily. Take with or immediately following a meal., Disp: 30 tablet, Rfl: 11   nitroGLYCERIN (NITROSTAT) 0.4 MG SL tablet, Place 0.4 mg under the tongue every 5 (five) minutes as needed for chest pain., Disp: , Rfl:    pantoprazole (PROTONIX) 40 MG tablet, Take 1 tablet (40 mg total) by mouth daily., Disp: 30 tablet, Rfl: 1   sacubitril-valsartan (ENTRESTO) 97-103 MG, TAKE 1 TABLET BY MOUTH TWICE A DAY, Disp: 180 tablet, Rfl: 1  Past Medical History: 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   Hypertension    Ischemic cardiomyopathy  MI, old    Peripheral neuropathy    PFO (patent foramen ovale) 05/2019   s/p repair   Sleep apnea    Spinal stenosis of lumbar region    TIA (transient ischemic attack)     Tobacco Use: Social History   Tobacco Use  Smoking Status Former  Smokeless Tobacco Never  Tobacco Comments   Quit over 40 years ago    Labs: Review Flowsheet  More data exists      Latest Ref Rng & Units 04/08/2020 07/04/2020 04/12/2022 04/13/2022 10/13/2022  Labs for ITP Cardiac and Pulmonary Rehab  Cholestrol 0 - 200 mg/dL - - - - 161   LDL (calc) 0 - 99 mg/dL - - - - 35   HDL-C >09 mg/dL - - - - 59   Trlycerides <150 mg/dL - - - - 55   Hemoglobin A1c 4.8 - 5.6 % 5.8  - - 5.5  6.3   Bicarbonate 20.0 - 28.0 mmol/L - - 23.9  - -  TCO2 22 - 32 mmol/L - 24  - -  -  Acid-base deficit 0.0 - 2.0 mmol/L - - 0.1  - -  O2 Saturation % - - 90.3  - -     Exercise Target Goals: Exercise Program Goal: Individual exercise prescription set using results from initial 6 min walk test and THRR while considering  patient's activity barriers and safety.   Exercise Prescription Goal: Initial exercise prescription builds to 30-45 minutes a day of aerobic activity, 2-3 days per week.  Home exercise guidelines will be given to patient during program as part of exercise prescription that the participant will acknowledge.   Education: Aerobic Exercise: - Group verbal and visual presentation on the components of exercise prescription. Introduces F.I.T.T principle from ACSM for exercise prescriptions.  Reviews F.I.T.T. principles of aerobic exercise including progression. Written material given at graduation. Flowsheet Row Cardiac Rehab from 11/19/2022 in [facility] Cardiac and Pulmonary Rehab  Education need identified 11/19/22       Education: Resistance Exercise: - Group verbal and visual presentation on the components of exercise prescription. Introduces F.I.T.T principle from ACSM for exercise prescriptions  Reviews F.I.T.T. principles of resistance exercise including progression. Written material given at graduation.    Education: Exercise & Equipment Safety: - Individual verbal instruction and demonstration of equipment use and safety with use of the equipment. Flowsheet Row Cardiac Rehab from 11/19/2022 in [facility] Cardiac and Pulmonary Rehab  Date 11/19/22  Educator NT  Instruction Review Code 1- Verbalizes Understanding       Education: Exercise Physiology & General Exercise Guidelines: - Group verbal and written instruction with models to review the exercise physiology of the cardiovascular system and associated critical values. Provides general exercise guidelines with specific guidelines to those with heart or lung disease.  Flowsheet Row Cardiac Rehab from  10/07/2019 in [facility] Cardiac and Pulmonary Rehab  Date 09/23/19  Educator AS  Instruction Review Code 1- Verbalizes Understanding       Education: Flexibility, Balance, Mind/Body Relaxation: - Group verbal and visual presentation with interactive activity on the components of exercise prescription. Introduces F.I.T.T principle from ACSM for exercise prescriptions. Reviews F.I.T.T. principles of flexibility and balance exercise training including progression. Also discusses the mind body connection.  Reviews various relaxation techniques to help reduce and manage stress (i.e. Deep breathing, progressive muscle relaxation, and visualization). Balance handout provided to take home. Written material given at graduation.   Activity Barriers & Risk Stratification:  Activity Barriers & Cardiac Risk  Stratification - 11/19/22 1442       Activity Barriers & Cardiac Risk Stratification   Activity Barriers Right Knee Replacement;Left Knee Replacement;Muscular Weakness;Balance Concerns;Other (comment)    Comments drop foot, impaired gait, neuropathy    Cardiac Risk Stratification Moderate             6 Minute Walk:  6 Minute Walk     Row Name 11/19/22 1440         6 Minute Walk   Phase Initial     Distance 620 feet     Walk Time 6 minutes     # of Rest Breaks 0     MPH 1.17     METS 1.02     RPE 11     Perceived Dyspnea  0     VO2 Peak 3.59     Symptoms No     Resting HR 56 bpm     Resting BP 128/68     Resting Oxygen Saturation  97 %     Exercise Oxygen Saturation  during 6 min walk 96 %     Max Ex. HR 68 bpm     Max Ex. BP 138/76     2 Minute Post BP 136/70              Oxygen Initial Assessment:   Oxygen Re-Evaluation:   Oxygen Discharge (Final Oxygen Re-Evaluation):   Initial Exercise Prescription:  Initial Exercise Prescription - 11/19/22 1400       Date of Initial Exercise RX and Referring Provider   Date 11/19/22    Referring Provider Dr. Adrian Blackwater  MD      Oxygen   Maintain Oxygen Saturation 88% or higher      Treadmill   MPH 1    Grade 0    Minutes 15    METs 1.8      NuStep   Level 1    SPM 80    Minutes 15    METs 1.02      Arm Ergometer   Level 1    RPM 50    Minutes 15    METs 1.02      Track   Laps 16    Minutes 15    METs 1.87      Prescription Details   Frequency (times per week) 2    Duration Progress to 30 minutes of continuous aerobic without signs/symptoms of physical distress      Intensity   THRR 40-80% of Max Heartrate 90-124    Ratings of Perceived Exertion 11-13    Perceived Dyspnea 0-4      Progression   Progression Continue to progress workloads to maintain intensity without signs/symptoms of physical distress.      Resistance Training   Training Prescription Yes    Weight 4 lb    Reps 10-15             Perform Capillary Blood Glucose checks as needed.  Exercise Prescription Changes:   Exercise Prescription Changes     Row Name 11/19/22 1400 12/13/22 0800 12/26/22 0800 01/09/23 0900 01/24/23 1400     Response to Exercise   Blood Pressure (Admit) 128/68 126/58 108/56 122/60 112/58   Blood Pressure (Exercise) 138/76 152/66 142/70 144/64 --   Blood Pressure (Exit) 136/70 124/68 108/60 118/60 124/64   Heart Rate (Admit) 56 bpm 69 bpm 74 bpm 74 bpm 104 bpm   Heart Rate (Exercise) 68 bpm 104 bpm 95 bpm  91 bpm 108 bpm   Heart Rate (Exit) 60 bpm 69 bpm 68 bpm 83 bpm 95 bpm   Oxygen Saturation (Admit) 97 % -- -- -- --   Oxygen Saturation (Exercise) 96 % -- -- -- --   Rating of Perceived Exertion (Exercise) 11 13 15 13 13    Perceived Dyspnea (Exercise) 0 -- -- -- 0   Symptoms none none none none none   Comments Results First two weeks of exercise -- -- --   Duration -- Continue with 30 min of aerobic exercise without signs/symptoms of physical distress. Continue with 30 min of aerobic exercise without signs/symptoms of physical distress. Continue with 30 min of aerobic  exercise without signs/symptoms of physical distress. Continue with 30 min of aerobic exercise without signs/symptoms of physical distress.   Intensity -- THRR unchanged THRR unchanged THRR unchanged THRR unchanged     Progression   Progression -- Continue to progress workloads to maintain intensity without signs/symptoms of physical distress. Continue to progress workloads to maintain intensity without signs/symptoms of physical distress. Continue to progress workloads to maintain intensity without signs/symptoms of physical distress. Continue to progress workloads to maintain intensity without signs/symptoms of physical distress.   Average METs -- 2.29 2.09 2.53 --     Resistance Training   Training Prescription -- Yes Yes Yes Yes   Weight -- none  Pt's doctor did not give clearance for resistance training none  Pt's doctor did not give clearance for resistance training none  Pt's doctor did not give clearance for resistance training none  Pt's doctor did not give clearance for resistance training   Reps -- 10-15 10-15 10-15 10-15     Interval Training   Interval Training -- No No No No     Treadmill   MPH -- 1.7 1.2 -- --   Grade -- 0 0 -- --   Minutes -- 15 15 -- --   METs -- 2.3 1.92 -- --     NuStep   Level -- 4 4 4  --   Minutes -- 15 15 30  --   METs -- 3.1 2.7 3 --     Arm Ergometer   Level -- -- 1 -- --   Minutes -- -- 15 -- --   METs -- -- 1 -- --     T5 Nustep   Level -- -- -- 1 4   Minutes -- -- -- 15 15     Biostep-RELP   Level -- 1 -- -- --   Minutes -- 15 -- -- --   METs -- 2 -- -- --     Track   Laps -- 16 25 10 10    Minutes -- 15 15 15 15    METs -- 1.87 2.36 1.54 --     Oxygen   Maintain Oxygen Saturation -- 88% or higher 88% or higher 88% or higher 88% or higher    Row Name 02/06/23 0800 02/11/23 1600 02/20/23 1500         Response to Exercise   Blood Pressure (Admit) 104/60 -- 102/58     Blood Pressure (Exit) 98/60 -- 100/54     Heart Rate  (Admit) 88 bpm -- 72 bpm     Heart Rate (Exercise) 97 bpm -- 85 bpm     Heart Rate (Exit) 75 bpm -- 82 bpm     Rating of Perceived Exertion (Exercise) 15 -- 15     Symptoms none -- none  Duration Continue with 30 min of aerobic exercise without signs/symptoms of physical distress. Continue with 30 min of aerobic exercise without signs/symptoms of physical distress. Continue with 30 min of aerobic exercise without signs/symptoms of physical distress.     Intensity THRR unchanged THRR unchanged THRR unchanged       Progression   Progression Continue to progress workloads to maintain intensity without signs/symptoms of physical distress. Continue to progress workloads to maintain intensity without signs/symptoms of physical distress. Continue to progress workloads to maintain intensity without signs/symptoms of physical distress.     Average METs 2.77 2.77 2.13       Resistance Training   Training Prescription Yes Yes Yes     Weight none  Pt's doctor did not give clearance for resistance training none  Pt's doctor did not give clearance for resistance training none  Pt's doctor did not give clearance for resistance training     Reps 10-15 10-15 10-15       Interval Training   Interval Training No No No       NuStep   Level 5 5 4      Minutes 15 15 15      METs 2.6 2.6 2.6       Track   Laps 10 10 3      Minutes 15 15 15      METs 1.54 1.54 1.16       Home Exercise Plan   Plans to continue exercise at -- Home (comment)  walking Home (comment)  walking     Frequency -- Add 2 additional days to program exercise sessions. Add 2 additional days to program exercise sessions.     Initial Home Exercises Provided -- 02/11/23 02/11/23       Oxygen   Maintain Oxygen Saturation 88% or higher 88% or higher 88% or higher              Exercise Comments:   Exercise Comments     Row Name 11/27/22 0941           Exercise Comments First full day of exercise!  Patient was oriented to gym  and equipment including functions, settings, policies, and procedures.  Patient's individual exercise prescription and treatment plan were reviewed.  All starting workloads were established based on the results of the 6 minute walk test done at initial orientation visit.  The plan for exercise progression was also introduced and progression will be customized based on patient's performance and goals.  AAA parameters  set by his physician explained to patient.                Exercise Goals and Review:   Exercise Goals     Row Name 11/19/22 1442             Exercise Goals   Increase Physical Activity Yes       Intervention Provide advice, education, support and counseling about physical activity/exercise needs.;Develop an individualized exercise prescription for aerobic and resistive training based on initial evaluation findings, risk stratification, comorbidities and participant's personal goals.       Expected Outcomes Long Term: Add in home exercise to make exercise part of routine and to increase amount of physical activity.;Long Term: Exercising regularly at least 3-5 days a week.;Short Term: Attend rehab on a regular basis to increase amount of physical activity.       Increase Strength and Stamina Yes       Intervention Provide advice, education, support and counseling about  physical activity/exercise needs.;Develop an individualized exercise prescription for aerobic and resistive training based on initial evaluation findings, risk stratification, comorbidities and participant's personal goals.       Expected Outcomes Short Term: Increase workloads from initial exercise prescription for resistance, speed, and METs.;Short Term: Perform resistance training exercises routinely during rehab and add in resistance training at home;Long Term: Improve cardiorespiratory fitness, muscular endurance and strength as measured by increased METs and functional capacity ( )       Able to understand  and use rate of perceived exertion (RPE) scale Yes       Intervention Provide education and explanation on how to use RPE scale       Expected Outcomes Short Term: Able to use RPE daily in rehab to express subjective intensity level;Long Term:  Able to use RPE to guide intensity level when exercising independently       Able to understand and use Dyspnea scale Yes       Intervention Provide education and explanation on how to use Dyspnea scale       Expected Outcomes Short Term: Able to use Dyspnea scale daily in rehab to express subjective sense of shortness of breath during exertion;Long Term: Able to use Dyspnea scale to guide intensity level when exercising independently       Knowledge and understanding of Target Heart Rate Range (THRR) Yes       Intervention Provide education and explanation of THRR including how the numbers were predicted and where they are located for reference       Expected Outcomes Short Term: Able to state/look up THRR;Short Term: Able to use daily as guideline for intensity in rehab;Long Term: Able to use THRR to govern intensity when exercising independently       Able to check pulse independently Yes       Intervention Provide education and demonstration on how to check pulse in carotid and radial arteries.;Review the importance of being able to check your own pulse for safety during independent exercise       Expected Outcomes Short Term: Able to explain why pulse checking is important during independent exercise;Long Term: Able to check pulse independently and accurately       Understanding of Exercise Prescription Yes       Intervention Provide education, explanation, and written materials on patient's individual exercise prescription       Expected Outcomes Short Term: Able to explain program exercise prescription;Long Term: Able to explain home exercise prescription to exercise independently                Exercise Goals Re-Evaluation :  Exercise Goals  Re-Evaluation     Row Name 11/27/22 0941 12/13/22 0818 12/26/22 0804 01/09/23 0941 01/15/23 0942     Exercise Goal Re-Evaluation   Exercise Goals Review Able to understand and use rate of perceived exertion (RPE) scale;Knowledge and understanding of Target Heart Rate Range (THRR);Understanding of Exercise Prescription;Able to understand and use Dyspnea scale Increase Physical Activity;Increase Strength and Stamina;Understanding of Exercise Prescription Increase Physical Activity;Increase Strength and Stamina;Understanding of Exercise Prescription Increase Physical Activity;Increase Strength and Stamina;Understanding of Exercise Prescription Increase Physical Activity;Increase Strength and Stamina;Understanding of Exercise Prescription   Comments Reviewed RPE  and dyspnea scale, THR and program prescription with pt today.  Pt voiced understanding and was given a copy of goals to take home. Ed is off to a good start in the program. He has done well on the treadmill so far, as he increased his  workload up to 1.7 mph with no incline. He also has done well at level one on the biostep and improved to level 4 on the T4 nustep. He has not any done any resistance training at this time due to not receiving clearance from his doctor. We will continue to monitor his progress in the program. Ed is doing well in rehab. He has continued to walk the track and increased from 16 laps to 25 laps! He also has has continued to do well at level 4 on the T4 nustep and level 1 on the arm ergometer. He has not done any resistance training at this time due to doctor's restrictions. We will continue to monitor his progress in the program. Ed is doing well in rehab. He has only walked the track once since the last review and was able to do 10 laps. He also continues to work at level 4 on the T4 nustep but was able to exercise for 30 minutes. He also began using the T5 nustep at level 1. We will continue to monitor his progress in the  program. He is doing well at reahb on level 4 on T4 and T5. He goes to PT 2 times per week. Otherwise his exercise at home is limited. Encouraged him to continue to work on strength and balance.   Expected Outcomes Short: Use RPE daily to regulate intensity. Long: Follow program prescription in THR. Short:Continue to follow current exercise prescription. Long: Continue exercise to improve strength and stamina. Short: Increase workload on arm ergometer. Long: Continue exercise to improve strength and stamina. Short: Increase laps on the track back up to previous number. Long: Continue exercise to improve strength and stamina. Short: walk more at home, imporve strength and balance. Long: Continue to work on Adult nurse.    Row Name 01/24/23 1448 02/06/23 0814 02/11/23 1612 02/20/23 1550       Exercise Goal Re-Evaluation   Exercise Goals Review Increase Physical Activity;Increase Strength and Stamina;Understanding of Exercise Prescription Increase Physical Activity;Increase Strength and Stamina;Understanding of Exercise Prescription Increase Physical Activity;Able to understand and use rate of perceived exertion (RPE) scale;Knowledge and understanding of Target Heart Rate Range (THRR);Understanding of Exercise Prescription;Increase Strength and Stamina;Able to understand and use Dyspnea scale;Able to check pulse independently Increase Physical Activity;Increase Strength and Stamina;Understanding of Exercise Prescription    Comments Ed continues to do well in rehab. He was recently able to increase his level on the T5 nustep from level 1 to 4. He was also able to maintain 10 track laps in 15 minutes. We will continue to monitor his progress in the program. Ed has only attended two sessions since the last review. He continues to do well walking the track and has consistently reached 10 laps. He also recently improved to level 5 on the T4 nustep. We will continue to monitor his progress in the program.  Reviewed home exercise with pt today.  Pt plans to walk and continue with physical therapy for exercise.  Reviewed THR, pulse, RPE, sign and symptoms, pulse oximetery and when to call 911 or MD.  Also discussed weather considerations and indoor options.  Pt voiced understanding. Ed continues to attend rehab consistently. He has only walked the track once since the last review and was only able to do 3 laps. He also continues to work at level 4 on the T4 nustep. We will encourage him to continue to progressively increase his workloads in order to see progress in the program. We will continue  to monitor his progress.    Expected Outcomes Short: Increase track laps, return to using the T4 nustep. Long: Continue exercise to improve strength and stamina. Short: Attend rehab more consistently. Long: Continue exercise to improve strength and stamina. Short: add 1-2 days a week of walking on off days of cardiac rehab and PT. Long: become independent with exercise routine. Short: Continue to progressively increase workloads and push for more laps on the track. Long: Continue exercise to improve strength and stamina.             Discharge Exercise Prescription (Final Exercise Prescription Changes):  Exercise Prescription Changes - 02/20/23 1500       Response to Exercise   Blood Pressure (Admit) 102/58    Blood Pressure (Exit) 100/54    Heart Rate (Admit) 72 bpm    Heart Rate (Exercise) 85 bpm    Heart Rate (Exit) 82 bpm    Rating of Perceived Exertion (Exercise) 15    Symptoms none    Duration Continue with 30 min of aerobic exercise without signs/symptoms of physical distress.    Intensity THRR unchanged      Progression   Progression Continue to progress workloads to maintain intensity without signs/symptoms of physical distress.    Average METs 2.13      Resistance Training   Training Prescription Yes    Weight none   Pt's doctor did not give clearance for resistance training   Reps 10-15       Interval Training   Interval Training No      NuStep   Level 4    Minutes 15    METs 2.6      Track   Laps 3    Minutes 15    METs 1.16      Home Exercise Plan   Plans to continue exercise at Home (comment)   walking   Frequency Add 2 additional days to program exercise sessions.    Initial Home Exercises Provided 02/11/23      Oxygen   Maintain Oxygen Saturation 88% or higher             Nutrition:  Target Goals: Understanding of nutrition guidelines, daily intake of sodium 1500mg , cholesterol 200mg , calories 30% from fat and 7% or less from saturated fats, daily to have 5 or more servings of fruits and vegetables.  Education: All About Nutrition: -Group instruction provided by verbal, written material, interactive activities, discussions, models, and posters to present general guidelines for heart healthy nutrition including fat, fiber, MyPlate, the role of sodium in heart healthy nutrition, utilization of the nutrition label, and utilization of this knowledge for meal planning. Follow up email sent as well. Written material given at graduation. Flowsheet Row Cardiac Rehab from 11/19/2022 in [facility] Cardiac and Pulmonary Rehab  Education need identified 11/19/22       Biometrics:  Pre Biometrics - 11/19/22 1443       Pre Biometrics   Height 5' 11.5" (1.816 m)    Weight 205 lb 3.2 oz (93.1 kg)    Waist Circumference 43 inches    Hip Circumference 42 inches    Waist to Hip Ratio 1.02 %    BMI (Calculated) 28.22    Single Leg Stand 1.2 seconds              Nutrition Therapy Plan and Nutrition Goals:  Nutrition Therapy & Goals - 11/27/22 1131       Nutrition Therapy   Diet Carb controlled,  Cardiac, low na    Protein (specify units) 90    Fiber 30 grams    Whole Grain Foods 3 servings    Saturated Fats 15 max. grams    Fruits and Vegetables 5 servings/day    Sodium 2 grams      Personal Nutrition Goals   Nutrition Goal Eat a protein at every  meal and pair it with a carb    Personal Goal #2 Use protein snacks if needed    Personal Goal #3 Use sweets smartly, and make good snack choices    Comments patient drinking >64oz of water most days. Rarely drinks sugary drinks. He eats 3 meals per day. Has veteran in-home assistance to help him make meals. Likes to go out to restaurants for social interaction, but tries to make good food choices. Reviewed mediterranean diet handout. Educated on types of fats, sources, and how to read labels. He reports he doesn't use salt at home, commended him and educated about sodium limits, how processed foods can have lots in them before arriving to the table. Reviewed his 24hr food recall, encouraged more protein. Recommended greek yogurt, cottage cheese, hard boiled eggs, tuna, chicken, premier protein shakes as ways to boost protein intake. Build out several meals and snacks with foods he likes and will eat, focusing on adequate protein intake, healthy fats and colorful plates with controlled portions of carbs.      Intervention Plan   Intervention Prescribe, educate and counsel regarding individualized specific dietary modifications aiming towards targeted core components such as weight, hypertension, lipid management, diabetes, heart failure and other comorbidities.;Nutrition handout(s) given to patient.    Expected Outcomes Long Term Goal: Adherence to prescribed nutrition plan.;Short Term Goal: A plan has been developed with personal nutrition goals set during dietitian appointment.;Short Term Goal: Understand basic principles of dietary content, such as calories, fat, sodium, cholesterol and nutrients.             Nutrition Assessments:  MEDIFICTS Score Key: >=70 Need to make dietary changes  40-70 Heart Healthy Diet <= 40 Therapeutic Level Cholesterol Diet  Flowsheet Row Cardiac Rehab from 11/19/2022 in [facility] Cardiac and Pulmonary Rehab  Picture Your Plate Total Score on Admission 72       Picture Your Plate Scores: <96 Unhealthy dietary pattern with much room for improvement. 41-50 Dietary pattern unlikely to meet recommendations for good health and room for improvement. 51-60 More healthful dietary pattern, with some room for improvement.  >60 Healthy dietary pattern, although there may be some specific behaviors that could be improved.    Nutrition Goals Re-Evaluation:  Nutrition Goals Re-Evaluation     Row Name 01/15/23 0945             Goals   Comment He reports he is doing well with his foods, including more veggies and lean meats. Cutting back on fatty beef and fried foods. He reports he is snacking less and choosing more colorful produce.       Expected Outcome Short: continue to limit fatty meats and sugary beverages. Long: Work towards maintain heart healthy diet                Nutrition Goals Discharge (Final Nutrition Goals Re-Evaluation):  Nutrition Goals Re-Evaluation - 01/15/23 0945       Goals   Comment He reports he is doing well with his foods, including more veggies and lean meats. Cutting back on fatty beef and fried foods. He reports he is snacking less  and choosing more colorful produce.    Expected Outcome Short: continue to limit fatty meats and sugary beverages. Long: Work towards maintain heart healthy diet             Psychosocial: Target Goals: Acknowledge presence or absence of significant depression and/or stress, maximize coping skills, provide positive support system. Participant is able to verbalize types and ability to use techniques and skills needed for reducing stress and depression.   Education: Stress, Anxiety, and Depression - Group verbal and visual presentation to define topics covered.  Reviews how body is impacted by stress, anxiety, and depression.  Also discusses healthy ways to reduce stress and to treat/manage anxiety and depression.  Written material given at graduation.   Education: Sleep  Hygiene -Provides group verbal and written instruction about how sleep can affect your health.  Define sleep hygiene, discuss sleep cycles and impact of sleep habits. Review good sleep hygiene tips.    Initial Review & Psychosocial Screening:  Initial Psych Review & Screening - 11/07/22 1003       Initial Review   Current issues with None Identified      Family Dynamics   Good Support System? Yes   lady friend     Barriers   Psychosocial barriers to participate in program There are no identifiable barriers or psychosocial needs.      Screening Interventions   Interventions Encouraged to exercise;To provide support and resources with identified psychosocial needs;Provide feedback about the scores to participant    Expected Outcomes Short Term goal: Utilizing psychosocial counselor, staff and physician to assist with identification of specific Stressors or current issues interfering with healing process. Setting desired goal for each stressor or current issue identified.;Long Term Goal: Stressors or current issues are controlled or eliminated.;Short Term goal: Identification and review with participant of any Quality of Life or Depression concerns found by scoring the questionnaire.;Long Term goal: The participant improves quality of Life and PHQ9 Scores as seen by post scores and/or verbalization of changes             Quality of Life Scores:   Quality of Life - 11/19/22 1434       Quality of Life   Select Quality of Life      Quality of Life Scores   Health/Function Pre 22.37 %    Socioeconomic Pre 24.07 %    Psych/Spiritual Pre 22 %    Family Pre 30 %    GLOBAL Pre 23.2 %            Scores of 19 and below usually indicate a poorer quality of life in these areas.  A difference of  2-3 points is a clinically meaningful difference.  A difference of 2-3 points in the total score of the Quality of Life Index has been associated with significant improvement in overall quality  of life, self-image, physical symptoms, and general health in studies assessing change in quality of life.  PHQ-9: Review Flowsheet  More data may exist      11/19/2022 01/12/2020 10/28/2019 09/09/2019 07/02/2019  Depression screen PHQ 2/9  Decreased Interest 1 3 2 1 2   Down, Depressed, Hopeless 1 0 0 0 1  PHQ - 2 Score 2 3 2 1 3   Altered sleeping 1 1 3 3 3   Tired, decreased energy 1 2 2  0 2  Change in appetite 1 1 1  0 1  Feeling bad or failure about yourself  0 0 0 0 0  Trouble concentrating  0 0 1 0 0  Moving slowly or fidgety/restless 0 0 1 0 0  Suicidal thoughts 0 1 0 0 0  PHQ-9 Score 5 8 10 4 9   Difficult doing work/chores Somewhat difficult Not difficult at all Somewhat difficult Not difficult at all Not difficult at all   Interpretation of Total Score  Total Score Depression Severity:  1-4 = Minimal depression, 5-9 = Mild depression, 10-14 = Moderate depression, 15-19 = Moderately severe depression, 20-27 = Severe depression   Psychosocial Evaluation and Intervention:  Psychosocial Evaluation - 11/07/22 1020       Psychosocial Evaluation & Interventions   Comments Ed has no barriers to attending the prgram. He has been in program before and wants to work on his leg strength and his balance. He is in PT at this time for balance concerns. He states no concerns with stress. He is working with his physician over his medication. He is ready to start    Expected Outcomes STGAttend all scheduled sessions, work on exercise progression while concentrating on leg strength and improved balance LTG Continues working on exercie progression after discharge    Continue Psychosocial Services  Follow up required by staff             Psychosocial Re-Evaluation:  Psychosocial Re-Evaluation     Row Name 01/15/23 [phone]             Psychosocial Re-Evaluation   Current issues with Current Sleep Concerns       Comments Reports his sleep has not been good, taking medication and says it helps  him. Otherwise he reports no stress or anxiety or depression. Reports he is complinat with his anti-depressant medication       Expected Outcomes Short: focus on maintain regular sleep habits and mental health. Long: Maintain positvie attitude       Interventions Encouraged to attend Cardiac Rehabilitation for the exercise       Continue Psychosocial Services  Follow up required by staff                Psychosocial Discharge (Final Psychosocial Re-Evaluation):  Psychosocial Re-Evaluation - 01/15/23 0948       Psychosocial Re-Evaluation   Current issues with Current Sleep Concerns    Comments Reports his sleep has not been good, taking medication and says it helps him. Otherwise he reports no stress or anxiety or depression. Reports he is complinat with his anti-depressant medication    Expected Outcomes Short: focus on maintain regular sleep habits and mental health. Long: Maintain positvie attitude    Interventions Encouraged to attend Cardiac Rehabilitation for the exercise    Continue Psychosocial Services  Follow up required by staff             Vocational Rehabilitation: Provide vocational rehab assistance to qualifying candidates.   Vocational Rehab Evaluation & Intervention:  Vocational Rehab - 11/07/22 1019       Initial Vocational Rehab Evaluation & Intervention   Assessment shows need for Vocational Rehabilitation No      Vocational Rehab Re-Evaulation   Comments retired             Education: Education Goals: Education classes will be provided on a variety of topics geared toward better understanding of heart health and risk factor modification. Participant will state understanding/return demonstration of topics presented as noted by education test scores.  Learning Barriers/Preferences:  Learning Barriers/Preferences - 11/07/22 1008       Learning Barriers/Preferences   Learning  Barriers None    Learning Preferences None             General  Cardiac Education Topics:  AED/CPR: - Group verbal and written instruction with the use of models to demonstrate the basic use of the AED with the basic ABC's of resuscitation.   Anatomy and Cardiac Procedures: - Group verbal and visual presentation and models provide information about basic cardiac anatomy and function. Reviews the testing methods done to diagnose heart disease and the outcomes of the test results. Describes the treatment choices: Medical Management, Angioplasty, or Coronary Bypass Surgery for treating various heart conditions including Myocardial Infarction, Angina, Valve Disease, and Cardiac Arrhythmias.  Written material given at graduation. Flowsheet Row Cardiac Rehab from 11/19/2022 in [facility] Cardiac and Pulmonary Rehab  Education need identified 11/19/22       Medication Safety: - Group verbal and visual instruction to review commonly prescribed medications for heart and lung disease. Reviews the medication, class of the drug, and side effects. Includes the steps to properly store meds and maintain the prescription regimen.  Written material given at graduation. Flowsheet Row Cardiac Rehab from 10/07/2019 in [facility] Cardiac and Pulmonary Rehab  Date 10/07/19  Educator SB  Instruction Review Code 1- Verbalizes Understanding       Intimacy: - Group verbal instruction through game format to discuss how heart and lung disease can affect sexual intimacy. Written material given at graduation..   Know Your Numbers and Heart Failure: - Group verbal and visual instruction to discuss disease risk factors for cardiac and pulmonary disease and treatment options.  Reviews associated critical values for Overweight/Obesity, Hypertension, Cholesterol, and Diabetes.  Discusses basics of heart failure: signs/symptoms and treatments.  Introduces Heart Failure Zone chart for action plan for heart failure.  Written material given at graduation.   Infection Prevention: - Provides verbal  and written material to individual with discussion of infection control including proper hand washing and proper equipment cleaning during exercise session. Flowsheet Row Cardiac Rehab from 11/19/2022 in [facility] Cardiac and Pulmonary Rehab  Date 11/19/22  Educator NT  Instruction Review Code 1- Verbalizes Understanding       Falls Prevention: - Provides verbal and written material to individual with discussion of falls prevention and safety. Flowsheet Row Cardiac Rehab from 11/19/2022 in [facility] Cardiac and Pulmonary Rehab  Date 11/19/22  Educator NT  Instruction Review Code 1- Verbalizes Understanding       Other: -Provides group and verbal instruction on various topics (see comments)   Knowledge Questionnaire Score:  Knowledge Questionnaire Score - 11/19/22 1429       Knowledge Questionnaire Score   Pre Score 21/26             Core Components/Risk Factors/Patient Goals at Admission:  Personal Goals and Risk Factors at Admission - 11/07/22 1008       Core Components/Risk Factors/Patient Goals on Admission    Weight Management Yes;Weight Maintenance    Intervention Weight Management: Develop a combined nutrition and exercise program designed to reach desired caloric intake, while maintaining appropriate intake of nutrient and fiber, sodium and fats, and appropriate energy expenditure required for the weight goal.    Admit Weight 204 lb (92.5 kg)    Goal Weight: Long Term 204 lb (92.5 kg)    Expected Outcomes Short Term: Continue to assess and modify interventions until short term weight is achieved;Long Term: Adherence to nutrition and physical activity/exercise program aimed toward attainment of established weight goal;Weight Maintenance: Understanding of the  daily nutrition guidelines, which includes 25-35% calories from fat, 7% or less cal from saturated fats, less than 200mg  cholesterol, less than 1.5gm of sodium, & 5 or more servings of fruits and vegetables daily    Diabetes  Yes    Intervention Provide education about signs/symptoms and action to take for hypo/hyperglycemia.;Provide education about proper nutrition, including hydration, and aerobic/resistive exercise prescription along with prescribed medications to achieve blood glucose in normal ranges: Fasting glucose 65-99 mg/dL    Expected Outcomes Short Term: Participant verbalizes understanding of the signs/symptoms and immediate care of hyper/hypoglycemia, proper foot care and importance of medication, aerobic/resistive exercise and nutrition plan for blood glucose control.;Long Term: Attainment of HbA1C < 7%.    Heart Failure Yes    Intervention Provide a combined exercise and nutrition program that is supplemented with education, support and counseling about heart failure. Directed toward relieving symptoms such as shortness of breath, decreased exercise tolerance, and extremity edema.    Expected Outcomes Improve functional capacity of life;Short term: Attendance in program 2-3 days a week with increased exercise capacity. Reported lower sodium intake. Reported increased fruit and vegetable intake. Reports medication compliance.;Short term: Daily weights obtained and reported for increase. Utilizing diuretic protocols set by physician.;Long term: Adoption of self-care skills and reduction of barriers for early signs and symptoms recognition and intervention leading to self-care maintenance.    Hypertension Yes    Intervention Provide education on lifestyle modifcations including regular physical activity/exercise, weight management, moderate sodium restriction and increased consumption of fresh fruit, vegetables, and low fat dairy, alcohol moderation, and smoking cessation.;Monitor prescription use compliance.    Expected Outcomes Short Term: Continued assessment and intervention until BP is < 140/78mm HG in hypertensive participants. < 130/66mm HG in hypertensive participants with diabetes, heart failure or chronic  kidney disease.;Long Term: Maintenance of blood pressure at goal levels.    Lipids Yes    Intervention Provide education and support for participant on nutrition & aerobic/resistive exercise along with prescribed medications to achieve LDL 70mg , HDL >40mg .    Expected Outcomes Short Term: Participant states understanding of desired cholesterol values and is compliant with medications prescribed. Participant is following exercise prescription and nutrition guidelines.;Long Term: Cholesterol controlled with medications as prescribed, with individualized exercise RX and with personalized nutrition plan. Value goals: LDL < 70mg , HDL > 40 mg.             Education:Diabetes - Individual verbal and written instruction to review signs/symptoms of diabetes, desired ranges of glucose level fasting, after meals and with exercise. Acknowledge that pre and post exercise glucose checks will be done for 3 sessions at entry of program. Flowsheet Row Cardiac Rehab from 01/13/2020 in [facility] Cardiac and Pulmonary Rehab  Date 01/13/20  Educator [facility]  Instruction Review Code 1- Verbalizes Understanding       Core Components/Risk Factors/Patient Goals Review:   Goals and Risk Factor Review     Row Name 01/15/23 [phone]             Core Components/Risk Factors/Patient Goals Review   Personal Goals Review Weight Management/Obesity;Hypertension;Diabetes;Lipids       Review He continue to work on watching his weight, checking his blood sugars now back on metformin. He is checking his blood pressure at home and it has been stable       Expected Outcomes Short: continue to check blood sugars and blood pressure. Long: Work towards healthy weight and more stable A1C and blood pressures  Core Components/Risk Factors/Patient Goals at Discharge (Final Review):   Goals and Risk Factor Review - 01/15/23 0951       Core Components/Risk Factors/Patient Goals Review   Personal Goals Review Weight  Management/Obesity;Hypertension;Diabetes;Lipids    Review He continue to work on watching his weight, checking his blood sugars now back on metformin. He is checking his blood pressure at home and it has been stable    Expected Outcomes Short: continue to check blood sugars and blood pressure. Long: Work towards healthy weight and more stable A1C and blood pressures             ITP Comments:  ITP Comments     Row Name 11/07/22 1019 11/19/22 1426 11/27/22 0939 12/12/22 1229 01/09/23 1516   ITP Comments Virtual orientation call completed today. he has an appointment on Date: [id number]  for EP eval and gym Orientation.  Documentation of diagnosis can be found in [facility]  Date: 10/13/2022  Letter sent for AAA parameters. Completed and gym orientation. Initial ITP created and sent for review to Dr. Daniel Nones, Medical Director. First full day of exercise!  Patient was oriented to gym and equipment including functions, settings, policies, and procedures.  Patient's individual exercise prescription and treatment plan were reviewed.  All starting workloads were established based on the results of the 6 minute walk test done at initial orientation visit.  The plan for exercise progression was also introduced and progression will be customized based on patient's performance and goals. AAA parameters  set by his physician explained to patient. 30 Day review completed. Medical Director ITP review done, changes made as directed, and signed approval by Medical Director.    new to program 30 Day review completed. Medical Director ITP review done, changes made as directed, and signed approval by Medical Director.    Row Name 01/30/23 1115 02/27/23 1134         ITP Comments 30 Day review completed. Medical Director ITP review done, changes made as directed, and signed approval by Medical Director. 30 Day review completed. Medical Director ITP review done, changes made as directed, and signed approval by Medical  Director.               Comments:

## 2023-03-04 ENCOUNTER — Ambulatory Visit: Payer: Medicare HMO

## 2023-03-04 DIAGNOSIS — M6281 Muscle weakness (generalized): Secondary | ICD-10-CM | POA: Diagnosis not present

## 2023-03-04 DIAGNOSIS — R2689 Other abnormalities of gait and mobility: Secondary | ICD-10-CM

## 2023-03-04 NOTE — Therapy (Signed)
OUTPATIENT PHYSICAL THERAPY LOWER EXTREMITY TREATMENT/PROGRESS NOTE/ Re-certification through 03/18/23    Patient Name: Devin [name] [facility]. MRN: [id number] DOB:[birth year], 77 y.o., male Today's Date: 02/13/23  END OF SESSION:  PT End of Session - 03/04/23 1407     Visit Number 30    Number of Visits 36    Date for PT Re-Evaluation 03/18/23    Authorization Type re-cert/PN done today x 4 weeks (6 additional visits)- planning to transition to HEP and DC by 03/18/23    PT Start Time 1200    PT Stop Time 1245    PT Time Calculation (min) 45 min    Equipment Utilized During Treatment Gait belt    Activity Tolerance Patient tolerated treatment well    Behavior During Therapy WFL for tasks assessed/performed                  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   Hypertension    Ischemic cardiomyopathy    MI, old    Peripheral neuropathy    PFO (patent foramen ovale) 05/2019   s/p repair   Sleep apnea    Spinal stenosis of lumbar region    TIA (transient ischemic attack)    Past Surgical History:  Procedure Laterality Date   APPENDECTOMY     BREAST SURGERY Right    benign mass   CARDIOVERSION Right 12/15/2019   Procedure: CARDIOVERSION;  Surgeon: Laurier Nancy, MD;  Location: [facility] ORS;  Service: Cardiovascular;  Laterality: Right;   CATARACT EXTRACTION, BILATERAL     COLONOSCOPY     CORONARY ANGIOPLASTY WITH STENT PLACEMENT     CORONARY ARTERY BYPASS GRAFT  05/2019   LIMA-LAD   CORONARY STENT INTERVENTION N/A 10/15/2022   Procedure: CORONARY STENT INTERVENTION;  Surgeon: Marcina Millard, MD;  Location:  [facility] INVASIVE CV LAB;  Service: Cardiovascular;  Laterality: N/A;   LEFT HEART CATH N/A 10/15/2022   Procedure: Left Heart Cath;  Surgeon: Marcina Millard, MD;  Location: [facility] INVASIVE CV LAB;  Service: Cardiovascular;  Laterality: N/A;   LEFT HEART CATH AND CORS/GRAFTS ANGIOGRAPHY N/A 05/21/2019   Procedure: LEFT HEART CATH AND CORONARY ANGIOGRAPHY;  Surgeon: Laurier Nancy, MD;  Location: [facility] INVASIVE CV LAB;  Service: Cardiovascular;  Laterality: N/A;   LUMBAR LAMINECTOMY/DECOMPRESSION MICRODISCECTOMY N/A 07/04/2020   Procedure: L4-5 LAMINECTOMY;  Surgeon: Lucy Chris, MD;  Location: [facility] ORS;  Service: Neurosurgery;  Laterality: N/A;   REPLACEMENT TOTAL KNEE BILATERAL     Patient Active Problem List   Diagnosis Date Noted   NSTEMI (non-ST elevated myocardial infarction) (HCC) 10/13/2022   HFrEF (heart failure with reduced ejection fraction) (HCC) 10/13/2022   Degenerative joint disease (DJD) of lumbar spine 10/12/2022   Frequent falls 05/15/2022   Unsteady gait 04/16/2022   Acute delirium 04/13/2022   Electrolyte abnormality 04/13/2022   Nausea & vomiting 04/13/2022   PAD (peripheral artery disease) (HCC) 09/03/2021   Localized, primary osteoarthritis 11/24/2020   Wound healing, delayed 09/08/2020   History of lumbar surgery 09/08/2020   Chronic ischemic heart disease 07/13/2020  History of MI (myocardial infarction) 07/13/2020   History of PTCA 1 07/13/2020   Other personal history presenting hazards to health 07/13/2020   Pure hypercholesterolemia 07/13/2020   Elevated lactic acid level 04/19/2020   Generalized weakness 04/09/2020   Fall at home 04/09/2020   Gastroenteritis 04/09/2020   AMS (altered mental status) 04/08/2020   Hypoxia 02/01/2020   Atrial fibrillation status post cardioversion (HCC) 12/22/2019   Congestive heart failure (CHF) (HCC) 12/13/2019   Hyponatremia 12/06/2019   Acute postoperative anemia due to expected blood loss 05/28/2019   Hyperlipidemia  05/28/2019   Ischemic cardiomyopathy 05/28/2019   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   Unstable angina (HCC) 05/24/2019   Chest pain 05/21/2019   Ischemic chest pain (HCC) 05/16/2019   CAD (coronary artery disease) 05/16/2019   HTN (hypertension) 05/16/2019   Depression 05/16/2019   GERD (gastroesophageal reflux disease) 05/16/2019   Verruca plantaris 01/23/2018   Obstructive sleep apnea syndrome 01/20/2018   Major depressive disorder, single episode, moderate (HCC) 07/05/2016   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 Zada Finders, MD  REFERRING PROVIDER: Dr. Zada Finders, MD/ [name], [facility]  REFERRING DIAG:  R26.9 (ICD-10-CM) - Unspecified abnormalities of gait and mobility  R26.89 (ICD-10-CM) - Other abnormalities of gait and mobility  R29.6 (ICD-10-CM) - Repeated falls    THERAPY DIAG:  Muscle weakness (generalized)  Balance problem  Rationale for Evaluation and Treatment: Rehabilitation  ONSET DATE: at least 2 years  SUBJECTIVE: (From initial evaluation note)  SUBJECTIVE STATEMENT: Pt reports he is having difficulty with gait and balance.  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   He feels like his balance and walking difficulty have worsened recently.  He reports he started falling frequently, he was catching his foot on things and tripping.  The most recent major fall was about 6 months ago.  He is being careful since then and trying to walk more to get stronger and trying to be more aware of his gait pattern.  He was walking with a walker until ~4 months ago, then started using a cane, and  then has tried walking without any assistance.      He feels like his difficulty walking limits his ability to get out of the house as much as he used to because he is fearful of falling.  PLOF- he enjoys getting out of the house for social activities, going out to breakfast too; going grocery shopping a few times/week to get his groceries; prior to his back surgery he walked at the park in [place], hasn't been back though because he is worried about falling though.  He has a home aide that comes a few days a week to assist with household tasks as needed.  Also reports he had a MI a little over a week ago and was in the hospital (8/3-10/16/22)- he states he is referred to begin cardiac rehab as part of his outpatient recovery.  PERTINENT HISTORY: Extensive cardiac history History of lumbar spine surgery- 2022 L4-5 microdiscectomy/laminectomy   PAIN:  Are you having pain? R groin muscle area where he had the cardiac catheterization procedure last week.  PRECAUTIONS: Fall  RED FLAGS: Pt with recent MI (10/13/22) with stent placement (see chart review)  WEIGHT BEARING RESTRICTIONS: No  FALLS:  Has patient fallen in last 6 months? Yes. Number of falls multiple  LIVING ENVIRONMENT: Lives with: lives alone (has a self reported "lady friend" for social support and enjoys being able to get out the house for community activities, going to restaurants together) Lives in: House/apartment Stairs:  he lives in a single level home with bonus room over the garage, but he does not need to go upstairs.1 step to enter in the garage- able to get in without falling. Has following equipment at home: Single point cane, Walker - 4 wheeled, and Wheelchair (manual) Has a home health aide that comes 2-3x/week if he needs assistance  OCCUPATION: retired  PLOF: Independent  PATIENT GOALS: to address drop foot and be able to manage it better; to strengthen his legs; and to increase his breathing; and improve his  balance   NEXT MD VISIT: yes, next week   OBJECTIVE:   DIAGNOSTIC FINDINGS: no recent lower extremity/spine imaging  PATIENT SURVEYS:  FOTO 39/52  COGNITION: Overall cognitive status: Within functional limits for tasks assessed     SENSATION: Pt reports reduced sensation to light touch L L4-5 dermatomes  EDEMA:  No pitting edema noted on dorsum of foot   POSTURE: pt stands with wide base of support for balance; uses UE support on LE's for sit to stand transfer  PALPATION: (-) TTP distal LE b/l  LOWER EXTREMITY ROM:  Active ROM Right eval Left eval  Hip flexion WNL WNL  Hip extension    Hip abduction    Hip adduction    Hip internal rotation    Hip external rotation    Knee flexion 105 105  Knee extension -5 -5  Ankle dorsiflexion 5 deg PROM 0 deg  Ankle plantarflexion    Great toe extension 45 deg PROM 40 deg   Ankle eversion     (Blank rows = not tested)  LOWER EXTREMITY MMT:  MMT Right eval Left eval  Hip flexion 4 4  Hip extension    Hip abduction    Hip adduction    Hip internal rotation    Hip external rotation    Knee flexion 5 5  Knee extension 4 4  Ankle dorsiflexion 4/5 1/5  Ankle plantarflexion 3/5 3/5  Great toe extension 4/5 2/5  Ankle eversion 4/5 3/5   (Blank rows = not tested)   FUNCTIONAL TESTS:  OUTCOME MEASURES: TEST Outcome Interpretation  5 times sit<>stand 21 seconds >60 yo, >15 sec indicates increased risk for falls  10 meter walk test deferred <1.0 m/s indicates increased risk for falls; limited community ambulator  [employer] Assessment /56   deferred <36/56 (100% risk for falls), 37-45 (80% risk for falls); 46-51 (>50% risk for falls); 52-55 (lower risk <25% of falls)      GAIT: Distance walked: from waiting area into PT gym 50 ft, PT supervision Assistive device utilized: None Level of assistance: Complete Independence Comments: Pt ambulates with decreased gait speed, wide BOS, decreased stride length, excess L  hip flexion strategy to compensate for L foot drop  Pt not able to stand SLS on L without UE support today; able to stand R LE x 5 seconds  TODAY'S TREATMENT:  DATE: 03/04/23 Subjective:  Pt reports he has not fallen.  Saw his PCP this week.  He states they discussed continuing PT to help continue building strength in legs and to decrease fear of falling and improve his ability to navigate stairs and transfers up/down from floor.  He got new sneakers because of a hammer toe and they have wide toe box and slight rocker bottom sole which he is not used to yet.  Pain: 0/10  Objective: Pt updated PT goals: continue working on improving LE strength, practice floor transfers to reduce fear of falling, and improve confidence and ability to ascend/descend the flight of stairs to his second level in his home which he has been avoiding  Therapeutic Exercises:  While wearing L AFO, no SPC during session, gait belt and PT supervision/SBA during standing exercises  Walking 5 min without SPC on flat surface- pt is wearing new shoes today so practiced achieving heel strike and also  Pt education regarding benefits of wide toe but, but also how this sole will decrease his stability bc of decreased BOS during static standing  Squat to pick up cones from floor- x 5  Forward stepping with PT external cue to tap R or L foot on different color cones- to practice reaction time/motor planning for balance- 4 laps in hallway  Sit to stand 2x5 (no weight held at chest), no use of UE  Front step ups: R LE 2x6, L LE 2x6 with min UE support  Stair training: pt ascend 4 stairs with b/l railing using step-to technique leading up with L LE and down with R LE.  Practiced 4x as one of his updated PT goals is to be able to ascend/descend his stairs to his second level at home.  Transfer training: sit  to supine and supine to sit on blue mat table, pt education regarding technique and how this movement pattern is relevant for both bed mobility and also as one portion of floor to standing training.  Practiced 3x.    Not today: Seated knee extension: 5# AW 2x20- not today  Seated marches: 5# AW 2x20- not today  Hurdles: 6 inch height, without SPC today; 4 laps forward- not today  Nautilus: 30 lbs resistance around trunk/pelvis- not today Lateral walking- 4 laps R, 4 laps L Forward/reverse- 4 laps  Obstacle course: stepping over yoga blocks, on/off/balancing on airex: 6 laps- practiced without PT hand hold support 4 laps- not today  Amb CW/CCW 2 laps in gym each while tossing ball/catch ball vertically (3 # med ball) dual tasking; PT SBA  Amb tossing ball R/L to PT while amb CW and CWW 2 laps each in gym (rainbow ball) dual tasking; PT SBA  Weave in/out of cones on floor without SPC- PT supervision 4 laps to facilitate navigating obstacles in crowded room/space   Ball toss at trampoline, standing on airex (5 lb) 3x10 with PT SBA- to practice reaction to external stimuli/balance training  Training for amb on uneven outdoor surfaces- incline/decline sidewalk; grass forward/reverse/lateral, curb situation navigation with [facility]- practice stepping up/down curb using SPC in R hand for scenarios where there may not be a railing; stepped down with L LE and pt prefers stepping up with L LE as SPC is in R hand. (5 min) Not today  Walk 1 loop around the building with PT supervision/gait belt- used his SPC, focused on navigating uneven/ramp/inclined/declined sidewalk surfaces.  No major loss of balance. Not today Side stepping forward/laterally/reverse over airex balance beam  in confined small space, without UE support- to facilitate using stepping strategy for balance in crowded spaces- 6 laps in parallel bars  Side stepping ON airex without UE support in parallel bars 4 laps  Step ups onto 6 inch  step: with 2 finger support ea UE; x10 lead with L LE, x10 leading with R LE   Star drill: PT verbal cue for R or L and step/weight shift forward/lateral/diagonal outside base of support then back to midline- multiple directions/rounds x 4 min with PT close supervision  Squat lift 5 cones from low surface (cones resting on 6 inch step, hand cones to PT then put cones back on the floor- 2 rounds Standing alternating toe taps to green cones: sets of 12 alternating- PT supervision with gait belt- not today  Alternating step ups on airex: x6 lead with R and x6 lead with L- in parallel bars- not today  PT SBA with gait belt: Not today High knee marches forward through blue ladder x 2 laps- not today  High knee lateral marches through ladder x 2 laps- not today  Diagonal ladder x 2 laps- not today  Reverse walking 4x 10 m- not today  Obstacle course in parallel bars: not today Step up/on/off airex, over 6 inch hurdle, cone toe taps R/L x5, step around cone in small space, step up/on/off airex- repeat opposite direction -Hurdles: forward direction in // bars (PT supervision with gait belt) 6 laps, focused on hip flexion strategy for foot clearance; attempted cue for "lift toes" but pt not able to actively perform active DF of ankle against gravity -Side step over hurdles in // bars: 6 laps -Airex: feet together, 3 trials x 30-40 seconds with removing hand support -Airex: tandem stance, 3 trials each R in front/L in front x 20-30 seconds removing hand support (pt notes ankle/lower leg fatigue after this)   PATIENT EDUCATION:  Education details: PT POC/goals, importance of attending cardiac rehab, activity pacing Person educated: Patient Education method: Explanation Education comprehension: verbalized understanding and needs further education  HOME EXERCISE PROGRAM: Access Code: [email or web address] URL: [email or web address] Date: 10/24/2022 Prepared by: Max Fickle  Exercises -  Seated Heel Raise  - 1 x daily - 3 x weekly - 2 sets - 20 reps - Seated March  - 1 x daily - 3 x weekly - 2 sets - 10 reps - Seated Long Arc Quad  - 1 x daily - 3 x weekly - 2 sets - 10 reps  ASSESSMENT:  CLINICAL IMPRESSION: Pt verbalized updated patient goals during today's session (see above).  He would benefit from additional training and practice with the components of floor to stand transfer training.  Today relies on verbal cues for supine to sit portion of transfer involving rolling instead of trunk flexion technique.  Able to reach and pick cone up from floor today, which is progress.  Stairs remain challenging, would benefit from more practice.  Patient will continue to benefit from skilled PT intervention including LE strengthening and transfer training to address listed impairments, to decrease fall risk and improve overall mobility and safety during ambulation.  Likely has not met maximum recovery with skilled PT.    OBJECTIVE IMPAIRMENTS: Abnormal gait, cardiopulmonary status limiting activity, decreased activity tolerance, decreased balance, decreased mobility, difficulty walking, decreased ROM, and decreased strength.   ACTIVITY LIMITATIONS: standing, squatting, stairs, transfers, and locomotion level  PARTICIPATION LIMITATIONS: meal prep, cleaning, laundry, interpersonal relationship, shopping, and community activity  PERSONAL FACTORS: Age, Past/current experiences, Time since  onset of injury/illness/exacerbation, and cardiovascular hx including recent MI with stent placement last week  are also affecting patient's functional outcome.   REHAB POTENTIAL: Good  CLINICAL DECISION MAKING: Stable/uncomplicated  EVALUATION COMPLEXITY: Low   GOALS: Goals reviewed with patient? Yes  SHORT TERM GOALS: Target date: 11/11/22 Pt will be able to perform a HEP for LE strengthening >3x/week Baseline: 11/27/22 pt has been instructed on a HEP; 12/24/22: met Goal status: Met   LONG TERM  GOALS: Target date: 03/18/23  Improve FOTO to >51 indicating pt able to perform his daily activities without being limited by his balance; 12/24/22: in progress Baseline: 39; 10/7: 54; 01/21/23 51; 02/18/23: in progress Goal status: In Progress  2.  Pt will be able to amb with typical width BOS and b/l heel strike gait pattern using least restrictive AD and AFO on flat/incline/decline/grass surfaces x 10 min Baseline: not using any AD or AFO in clinic, pt wide BOS, no L heel strike due to foot drop; 11/27/22 pt is amb with SPC now, discussions continue regarding AFO/brace for improved safety; 12/24/22: pt now wearing AFO and using SPC on uneven surfaces with PT SBA x 5 minutes; 01/21/23: in progress- able to amb intervals of 5-6 min x 2 with 1-2 min sitting break between Goal status: In progress  3.  Pt will be able to perform 5x STS in <15 sec indicating reduced risk for falling Baseline: 21 sec, 11/27/22 19 sec (with mild UE use); 12/28/22: 18 seconds  (with mild UE support) 01/21/23:  19 seconds without UE support; 02/18/23 17 seconds without UE support Goal status: In Progress  4. Pt will be able to amb x 1 block distance to walk his dog 1x/day.  Baseline: 1/4-1/2 block (pt self report), 01/21/23: amb 1 loop around building in 5.25 minutes at clinic  Goal status: In progress  5. Improve 10 M walk test to >1.0 m/s to promote reduced fall risk and improved community ambulator status  Baseline: 12/24/22 .9 m/s; 01/21/23: .8 m/s (at end of session, was fatigued)  Goal status: In progress  6. Pt will independently be able to transfer stand to floor and floor to standing with use of chair/sturdy surface to simulate being able to get up from floor and to reduce fear/risk of falling  Baseline: pt has not tried and expresses fear of not being able to get up from floor living alone  Goal status: new   PLAN:  PT FREQUENCY: 1-2x/week  PT DURATION: 4 weeks  PLANNED INTERVENTIONS: Therapeutic  exercises, Therapeutic activity, Neuromuscular re-education, Balance training, Gait training, Patient/Family education, Self Care, Joint mobilization, and Orthotic/Fit training  PLAN FOR NEXT SESSION: continue with proximal hip and LE strengthening, multidirectional movements/dynamic balance activities, improving confidence/reducing fear of falling through activities in small spaces and that require increased reaction speed to unpredictable external stimuli; fall recovery/floor transfers training  Max Fickle, PT, DPT, OCS  Physical Therapist - [facility]   Vinnie Langton [place], [place] 02/13/23 2:08 PM

## 2023-03-07 ENCOUNTER — Encounter: Payer: Medicare HMO | Admitting: *Deleted

## 2023-03-07 DIAGNOSIS — Z955 Presence of coronary angioplasty implant and graft: Secondary | ICD-10-CM

## 2023-03-07 DIAGNOSIS — I214 Non-ST elevation (NSTEMI) myocardial infarction: Secondary | ICD-10-CM

## 2023-03-07 DIAGNOSIS — Z48812 Encounter for surgical aftercare following surgery on the circulatory system: Secondary | ICD-10-CM | POA: Diagnosis not present

## 2023-03-07 NOTE — Progress Notes (Signed)
Daily Session Note  Patient Details  Name: Devin [name] [facility]. MRN: [id number] Date of Birth: 03-29-1945 Referring Provider:   Flowsheet Row Cardiac Rehab from 11/19/2022 in [facility]  Referring Provider Dr. Adrian Blackwater MD       Encounter Date: 03/07/2023  Check In:  Session Check In - 03/07/23 1543       Check-In   Supervising physician immediately available to respond to emergencies See telemetry face sheet for immediately available ER MD    Location [facility]    Staff Present Susann Givens, RN BSN;Joseph Reino Kent, RCP,RRT,BSRT;Megan Katrinka Blazing, RN, ADN;Maxon Conetta BS, , Exercise Physiologist    Virtual Visit No    Medication changes reported     No    Fall or balance concerns reported    No    Warm-up and Cool-down Performed on first and last piece of equipment    Resistance Training Performed Yes    VAD Patient? No    PAD/SET Patient? No      Pain Assessment   Currently in Pain? No/denies                Social History   Tobacco Use  Smoking Status Former  Smokeless Tobacco Never  Tobacco Comments   Quit over 40 years ago    Goals Met:  Independence with exercise equipment Exercise tolerated well No report of concerns or symptoms today Strength training completed today  Goals Unmet:  Not Applicable  Comments: Pt able to follow exercise prescription today without complaint.  Will continue to monitor for progression.    Dr. Bethann Punches is Medical Director for [facility] Cardiac Rehabilitation.  Dr. Vida Rigger is Medical Director for [facility] Pulmonary Rehabilitation.

## 2023-03-08 ENCOUNTER — Ambulatory Visit: Payer: Medicare HMO

## 2023-03-11 ENCOUNTER — Ambulatory Visit: Payer: Medicare HMO

## 2023-03-11 DIAGNOSIS — R2689 Other abnormalities of gait and mobility: Secondary | ICD-10-CM

## 2023-03-11 DIAGNOSIS — M6281 Muscle weakness (generalized): Secondary | ICD-10-CM

## 2023-03-11 NOTE — Therapy (Signed)
OUTPATIENT PHYSICAL THERAPY LOWER EXTREMITY TREATMENT/PROGRESS NOTE/ Re-certification through 03/18/23    Patient Name: Devin [name] [facility]. MRN: [id number] DOB:Jul 14, 1945, 77 y.o., male Today's Date: 02/13/23  END OF SESSION:  PT End of Session - 03/11/23 1201     Visit Number 31    Number of Visits 36    Date for PT Re-Evaluation 03/18/23    Authorization Type re-cert/PN done today x 4 weeks (6 additional visits)- planning to transition to HEP and DC by 03/18/23    PT Start Time 1200    PT Stop Time 1245    PT Time Calculation (min) 45 min    Equipment Utilized During Treatment Gait belt    Activity Tolerance Patient tolerated treatment well    Behavior During Therapy WFL for tasks assessed/performed                  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   Hypertension    Ischemic cardiomyopathy    MI, old    Peripheral neuropathy    PFO (patent foramen ovale) 05/2019   s/p repair   Sleep apnea    Spinal stenosis of lumbar region    TIA (transient ischemic attack)    Past Surgical History:  Procedure Laterality Date   APPENDECTOMY     BREAST SURGERY Right    benign mass   CARDIOVERSION Right 12/15/2019   Procedure: CARDIOVERSION;  Surgeon: Laurier Nancy, MD;  Location: [facility] ORS;  Service: Cardiovascular;  Laterality: Right;   CATARACT EXTRACTION, BILATERAL     COLONOSCOPY     CORONARY ANGIOPLASTY WITH STENT PLACEMENT     CORONARY ARTERY BYPASS GRAFT  05/2019   LIMA-LAD   CORONARY STENT INTERVENTION N/A 10/15/2022   Procedure: CORONARY STENT INTERVENTION;  Surgeon: Marcina Millard, MD;  Location:  [facility] INVASIVE CV LAB;  Service: Cardiovascular;  Laterality: N/A;   LEFT HEART CATH N/A 10/15/2022   Procedure: Left Heart Cath;  Surgeon: Marcina Millard, MD;  Location: [facility] INVASIVE CV LAB;  Service: Cardiovascular;  Laterality: N/A;   LEFT HEART CATH AND CORS/GRAFTS ANGIOGRAPHY N/A 05/21/2019   Procedure: LEFT HEART CATH AND CORONARY ANGIOGRAPHY;  Surgeon: Laurier Nancy, MD;  Location: [facility] INVASIVE CV LAB;  Service: Cardiovascular;  Laterality: N/A;   LUMBAR LAMINECTOMY/DECOMPRESSION MICRODISCECTOMY N/A 07/04/2020   Procedure: L4-5 LAMINECTOMY;  Surgeon: Lucy Chris, MD;  Location: [facility] ORS;  Service: Neurosurgery;  Laterality: N/A;   REPLACEMENT TOTAL KNEE BILATERAL     Patient Active Problem List   Diagnosis Date Noted   NSTEMI (non-ST elevated myocardial infarction) (HCC) 10/13/2022   HFrEF (heart failure with reduced ejection fraction) (HCC) 10/13/2022   Degenerative joint disease (DJD) of lumbar spine 10/12/2022   Frequent falls 05/15/2022   Unsteady gait 04/16/2022   Acute delirium 04/13/2022   Electrolyte abnormality 04/13/2022   Nausea & vomiting 04/13/2022   PAD (peripheral artery disease) (HCC) 09/03/2021   Localized, primary osteoarthritis 11/24/2020   Wound healing, delayed 09/08/2020   History of lumbar surgery 09/08/2020   Chronic ischemic heart disease 07/13/2020  History of MI (myocardial infarction) 07/13/2020   History of PTCA 1 07/13/2020   Other personal history presenting hazards to health 07/13/2020   Pure hypercholesterolemia 07/13/2020   Elevated lactic acid level 04/19/2020   Generalized weakness 04/09/2020   Fall at home 04/09/2020   Gastroenteritis 04/09/2020   AMS (altered mental status) 04/08/2020   Hypoxia 02/01/2020   Atrial fibrillation status post cardioversion (HCC) 12/22/2019   Congestive heart failure (CHF) (HCC) 12/13/2019   Hyponatremia 12/06/2019   Acute postoperative anemia due to expected blood loss 05/28/2019   Hyperlipidemia  05/28/2019   Ischemic cardiomyopathy 05/28/2019   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   Unstable angina (HCC) 05/24/2019   Chest pain 05/21/2019   Ischemic chest pain (HCC) 05/16/2019   CAD (coronary artery disease) 05/16/2019   HTN (hypertension) 05/16/2019   Depression 05/16/2019   GERD (gastroesophageal reflux disease) 05/16/2019   Verruca plantaris 01/23/2018   Obstructive sleep apnea syndrome 01/20/2018   Major depressive disorder, single episode, moderate (HCC) 07/05/2016   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 Zada Finders, MD  REFERRING PROVIDER: Dr. Zada Finders, MD/ [name], [facility]  REFERRING DIAG:  R26.9 (ICD-10-CM) - Unspecified abnormalities of gait and mobility  R26.89 (ICD-10-CM) - Other abnormalities of gait and mobility  R29.6 (ICD-10-CM) - Repeated falls    THERAPY DIAG:  Muscle weakness (generalized)  Balance problem  Rationale for Evaluation and Treatment: Rehabilitation  ONSET DATE: at least 2 years  SUBJECTIVE: (From initial evaluation note)  SUBJECTIVE STATEMENT: Pt reports he is having difficulty with gait and balance.  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   He feels like his balance and walking difficulty have worsened recently.  He reports he started falling frequently, he was catching his foot on things and tripping.  The most recent major fall was about 6 months ago.  He is being careful since then and trying to walk more to get stronger and trying to be more aware of his gait pattern.  He was walking with a walker until ~4 months ago, then started using a cane, and  then has tried walking without any assistance.      He feels like his difficulty walking limits his ability to get out of the house as much as he used to because he is fearful of falling.  PLOF- he enjoys getting out of the house for social activities, going out to breakfast too; going grocery shopping a few times/week to get his groceries; prior to his back surgery he walked at the park in [place], hasn't been back though because he is worried about falling though.  He has a home aide that comes a few days a week to assist with household tasks as needed.  Also reports he had a MI a little over a week ago and was in the hospital (8/3-10/16/22)- he states he is referred to begin cardiac rehab as part of his outpatient recovery.  PERTINENT HISTORY: Extensive cardiac history History of lumbar spine surgery- 2022 L4-5 microdiscectomy/laminectomy   PAIN:  Are you having pain? R groin muscle area where he had the cardiac catheterization procedure last week.  PRECAUTIONS: Fall  RED FLAGS: Pt with recent MI (10/13/22) with stent placement (see chart review)  WEIGHT BEARING RESTRICTIONS: No  FALLS:  Has patient fallen in last 6 months? Yes. Number of falls multiple  LIVING ENVIRONMENT: Lives with: lives alone (has a self reported "lady friend" for social support and enjoys being able to get out the house for community activities, going to restaurants together) Lives in: House/apartment Stairs:  he lives in a single level home with bonus room over the garage, but he does not need to go upstairs.1 step to enter in the garage- able to get in without falling. Has following equipment at home: Single point cane, Walker - 4 wheeled, and Wheelchair (manual) Has a home health aide that comes 2-3x/week if he needs assistance  OCCUPATION: retired  PLOF: Independent  PATIENT GOALS: to address drop foot and be able to manage it better; to strengthen his legs; and to increase his breathing; and improve his  balance   NEXT MD VISIT: yes, next week   OBJECTIVE:   DIAGNOSTIC FINDINGS: no recent lower extremity/spine imaging  PATIENT SURVEYS:  FOTO 39/52  COGNITION: Overall cognitive status: Within functional limits for tasks assessed     SENSATION: Pt reports reduced sensation to light touch L L4-5 dermatomes  EDEMA:  No pitting edema noted on dorsum of foot   POSTURE: pt stands with wide base of support for balance; uses UE support on LE's for sit to stand transfer  PALPATION: (-) TTP distal LE b/l  LOWER EXTREMITY ROM:  Active ROM Right eval Left eval  Hip flexion WNL WNL  Hip extension    Hip abduction    Hip adduction    Hip internal rotation    Hip external rotation    Knee flexion 105 105  Knee extension -5 -5  Ankle dorsiflexion 5 deg PROM 0 deg  Ankle plantarflexion    Great toe extension 45 deg PROM 40 deg   Ankle eversion     (Blank rows = not tested)  LOWER EXTREMITY MMT:  MMT Right eval Left eval  Hip flexion 4 4  Hip extension    Hip abduction    Hip adduction    Hip internal rotation    Hip external rotation    Knee flexion 5 5  Knee extension 4 4  Ankle dorsiflexion 4/5 1/5  Ankle plantarflexion 3/5 3/5  Great toe extension 4/5 2/5  Ankle eversion 4/5 3/5   (Blank rows = not tested)   FUNCTIONAL TESTS:  OUTCOME MEASURES: TEST Outcome Interpretation  5 times sit<>stand 21 seconds >60 yo, >15 sec indicates increased risk for falls  10 meter walk test deferred <1.0 m/s indicates increased risk for falls; limited community ambulator  [employer] Assessment /56   deferred <36/56 (100% risk for falls), 37-45 (80% risk for falls); 46-51 (>50% risk for falls); 52-55 (lower risk <25% of falls)      GAIT: Distance walked: from waiting area into PT gym 50 ft, PT supervision Assistive device utilized: None Level of assistance: Complete Independence Comments: Pt ambulates with decreased gait speed, wide BOS, decreased stride length, excess L  hip flexion strategy to compensate for L foot drop  Pt not able to stand SLS on L without UE support today; able to stand R LE x 5 seconds  TODAY'S TREATMENT:  DATE: 03/11/23 Subjective:  Pt reports he exchanged his new sneakers and decided to purchase a different pair that have a flatter zero drop sole.  Pain: 0/10  Objective: Pt updated PT goals: continue working on improving LE strength, practice floor transfers to reduce fear of falling, and improve confidence and ability to ascend/descend the flight of stairs to his second level in his home which he has been avoiding  Therapeutic Exercises:  While wearing L AFO, no SPC during session, gait belt and PT supervision/SBA during standing exercises  Transfer training: sit to supine and supine to sit on blue mat table, pt education regarding technique and how this movement pattern is relevant for both bed mobility and also as one portion of floor to standing training.  Practiced 3x.  Added rolling to R and rolling to L 3x ea.  Added sidelying to quadruped transfer and then quadruped to R sidelying 3x. 25 minutes  Split squat: in parallel bars, L foot in front 3 sets to fatigue (12-15 reps), R foot in front 3 sets to fatigue (6-7 reps)  Sit to stand 2x5 (5# weight held at chest), no use of UE  Seated knee extension: 5# ankle weights to fatigue, 2x20 ea LE  Seated marches: 5# ankle weights to fatigue, 2x20 ea LE   Not today: Forward stepping with PT external cue to tap R or L foot on different color cones- to practice reaction time/motor planning for balance- 4 laps in hallway Stair training: pt ascend 4 stairs with b/l railing using step-to technique leading up with L LE and down with R LE.  Practiced 4x as one of his updated PT goals is to be able to ascend/descend his stairs to his second level at home.  Walking 5  min without SPC on flat surface- pt is wearing new shoes today so practiced achieving heel strike and also  Pt education regarding benefits of wide toe but, but also how this sole will decrease his stability bc of decreased BOS during static standing  Squat to pick up cones from floor- x 5 Front step ups: R LE 2x6, L LE 2x6 with min UE support  Hurdles: 6 inch height, without SPC today; 4 laps forward- not today  Nautilus: 30 lbs resistance around trunk/pelvis- not today Lateral walking- 4 laps R, 4 laps L Forward/reverse- 4 laps  Obstacle course: stepping over yoga blocks, on/off/balancing on airex: 6 laps- practiced without PT hand hold support 4 laps- not today  Amb CW/CCW 2 laps in gym each while tossing ball/catch ball vertically (3 # med ball) dual tasking; PT SBA  Amb tossing ball R/L to PT while amb CW and CWW 2 laps each in gym (rainbow ball) dual tasking; PT SBA  Weave in/out of cones on floor without SPC- PT supervision 4 laps to facilitate navigating obstacles in crowded room/space   Ball toss at trampoline, standing on airex (5 lb) 3x10 with PT SBA- to practice reaction to external stimuli/balance training  Training for amb on uneven outdoor surfaces- incline/decline sidewalk; grass forward/reverse/lateral, curb situation navigation with [facility]- practice stepping up/down curb using SPC in R hand for scenarios where there may not be a railing; stepped down with L LE and pt prefers stepping up with L LE as SPC is in R hand. (5 min) Not today  Walk 1 loop around the building with PT supervision/gait belt- used his SPC, focused on navigating uneven/ramp/inclined/declined sidewalk surfaces.  No major loss of balance. Not today Side stepping forward/laterally/reverse  over airex balance beam in confined small space, without UE support- to facilitate using stepping strategy for balance in crowded spaces- 6 laps in parallel bars  Side stepping ON airex without UE support in parallel bars 4  laps  Step ups onto 6 inch step: with 2 finger support ea UE; x10 lead with L LE, x10 leading with R LE   Star drill: PT verbal cue for R or L and step/weight shift forward/lateral/diagonal outside base of support then back to midline- multiple directions/rounds x 4 min with PT close supervision  Squat lift 5 cones from low surface (cones resting on 6 inch step, hand cones to PT then put cones back on the floor- 2 rounds Standing alternating toe taps to green cones: sets of 12 alternating- PT supervision with gait belt- not today  Alternating step ups on airex: x6 lead with R and x6 lead with L- in parallel bars- not today  PT SBA with gait belt: Not today High knee marches forward through blue ladder x 2 laps- not today  High knee lateral marches through ladder x 2 laps- not today  Diagonal ladder x 2 laps- not today  Reverse walking 4x 10 m- not today  Obstacle course in parallel bars: not today Step up/on/off airex, over 6 inch hurdle, cone toe taps R/L x5, step around cone in small space, step up/on/off airex- repeat opposite direction -Hurdles: forward direction in // bars (PT supervision with gait belt) 6 laps, focused on hip flexion strategy for foot clearance; attempted cue for "lift toes" but pt not able to actively perform active DF of ankle against gravity -Side step over hurdles in // bars: 6 laps -Airex: feet together, 3 trials x 30-40 seconds with removing hand support -Airex: tandem stance, 3 trials each R in front/L in front x 20-30 seconds removing hand support (pt notes ankle/lower leg fatigue after this)   PATIENT EDUCATION:  Education details: PT POC/goals, importance of attending cardiac rehab, activity pacing Person educated: Patient Education method: Explanation Education comprehension: verbalized understanding and needs further education  HOME EXERCISE PROGRAM: Access Code: [email or web address] URL: [email or web address] Date: 10/24/2022 Prepared by:  Max Fickle  Exercises - Seated Heel Raise  - 1 x daily - 3 x weekly - 2 sets - 20 reps - Seated March  - 1 x daily - 3 x weekly - 2 sets - 10 reps - Seated Long Arc Quad  - 1 x daily - 3 x weekly - 2 sets - 10 reps  ASSESSMENT:  CLINICAL IMPRESSION: Able to progress with transfer training for working on transfers from floor to standing during today's PT session; added sidelying to quadruped tranfers and pt able to demonstrate ability to perform this safely and independently.  Will add quadruped to half tall kneeling using UE support to push up at next session.  Worked on split squats after transfer training to build strength through partial ROM in LE's for functional movement required for transfer from half tall kneeling to standing.  Patient will continue to benefit from skilled PT intervention including LE strengthening and transfer training to address listed impairments, to decrease fall risk and improve overall mobility and safety during ambulation.  Likely has not met maximum recovery with skilled PT.    OBJECTIVE IMPAIRMENTS: Abnormal gait, cardiopulmonary status limiting activity, decreased activity tolerance, decreased balance, decreased mobility, difficulty walking, decreased ROM, and decreased strength.   ACTIVITY LIMITATIONS: standing, squatting, stairs, transfers, and locomotion level  PARTICIPATION LIMITATIONS: meal prep, cleaning, laundry, interpersonal  relationship, shopping, and community activity  PERSONAL FACTORS: Age, Past/current experiences, Time since onset of injury/illness/exacerbation, and cardiovascular hx including recent MI with stent placement last week  are also affecting patient's functional outcome.   REHAB POTENTIAL: Good  CLINICAL DECISION MAKING: Stable/uncomplicated  EVALUATION COMPLEXITY: Low   GOALS: Goals reviewed with patient? Yes  SHORT TERM GOALS: Target date: 11/11/22 Pt will be able to perform a HEP for LE strengthening >3x/week Baseline:  11/27/22 pt has been instructed on a HEP; 12/24/22: met Goal status: Met   LONG TERM GOALS: Target date: 03/18/23  Improve FOTO to >51 indicating pt able to perform his daily activities without being limited by his balance; 12/24/22: in progress Baseline: 39; 10/7: 54; 01/21/23 51; 02/18/23: in progress Goal status: In Progress  2.  Pt will be able to amb with typical width BOS and b/l heel strike gait pattern using least restrictive AD and AFO on flat/incline/decline/grass surfaces x 10 min Baseline: not using any AD or AFO in clinic, pt wide BOS, no L heel strike due to foot drop; 11/27/22 pt is amb with SPC now, discussions continue regarding AFO/brace for improved safety; 12/24/22: pt now wearing AFO and using SPC on uneven surfaces with PT SBA x 5 minutes; 01/21/23: in progress- able to amb intervals of 5-6 min x 2 with 1-2 min sitting break between Goal status: In progress  3.  Pt will be able to perform 5x STS in <15 sec indicating reduced risk for falling Baseline: 21 sec, 11/27/22 19 sec (with mild UE use); 12/28/22: 18 seconds  (with mild UE support) 01/21/23:  19 seconds without UE support; 02/18/23 17 seconds without UE support Goal status: In Progress  4. Pt will be able to amb x 1 block distance to walk his dog 1x/day.  Baseline: 1/4-1/2 block (pt self report), 01/21/23: amb 1 loop around building in 5.25 minutes at clinic  Goal status: In progress  5. Improve 10 M walk test to >1.0 m/s to promote reduced fall risk and improved community ambulator status  Baseline: 12/24/22 .9 m/s; 01/21/23: .8 m/s (at end of session, was fatigued)  Goal status: In progress  6. Pt will independently be able to transfer stand to floor and floor to standing with use of chair/sturdy surface to simulate being able to get up from floor and to reduce fear/risk of falling  Baseline: pt has not tried and expresses fear of not being able to get up from floor living alone  Goal status: new   PLAN:  PT  FREQUENCY: 1-2x/week  PT DURATION: 4 weeks  PLANNED INTERVENTIONS: Therapeutic exercises, Therapeutic activity, Neuromuscular re-education, Balance training, Gait training, Patient/Family education, Self Care, Joint mobilization, and Orthotic/Fit training  PLAN FOR NEXT SESSION: continue with proximal hip and LE strengthening, multidirectional movements/dynamic balance activities, improving confidence/reducing fear of falling through activities in small spaces and that require increased reaction speed to unpredictable external stimuli; fall recovery/floor transfers training  Max Fickle, PT, DPT, OCS  Physical Therapist - [facility]   Ardine Bjork, [place] 02/13/23 12:01 PM

## 2023-03-14 ENCOUNTER — Encounter: Payer: Medicare HMO | Attending: Cardiovascular Disease | Admitting: *Deleted

## 2023-03-14 DIAGNOSIS — I214 Non-ST elevation (NSTEMI) myocardial infarction: Secondary | ICD-10-CM | POA: Diagnosis present

## 2023-03-14 DIAGNOSIS — I252 Old myocardial infarction: Secondary | ICD-10-CM | POA: Insufficient documentation

## 2023-03-14 DIAGNOSIS — Z955 Presence of coronary angioplasty implant and graft: Secondary | ICD-10-CM | POA: Insufficient documentation

## 2023-03-14 DIAGNOSIS — Z48812 Encounter for surgical aftercare following surgery on the circulatory system: Secondary | ICD-10-CM | POA: Insufficient documentation

## 2023-03-14 NOTE — Progress Notes (Signed)
 Daily Session Note  Patient Details  Name: Devin [name]. MRN: [id number] Date of Birth: [birth year] Referring Provider:   Flowsheet Row Cardiac Rehab from 11/19/2022 in [facility]  Referring Provider Dr. Denyse Bathe MD       Encounter Date: 03/14/2023  Check In:  Session Check In - 03/14/23 1612       Check-In   Supervising physician immediately available to respond to emergencies See telemetry face sheet for immediately available ER MD    Location [facility]    Staff Present Othel Durand, RN, BSN, CCRP;Meredith Tressa, RN BSN;Maxon Conetta BS, , Exercise Physiologist;Megan Claudene, RN, ADN    Virtual Visit No    Medication changes reported     No    Fall or balance concerns reported    No    Warm-up and Cool-down Performed on first and last piece of equipment    Resistance Training Performed Yes    VAD Patient? No    PAD/SET Patient? No      Pain Assessment   Currently in Pain? No/denies                Social History   Tobacco Use  Smoking Status Former  Smokeless Tobacco Never  Tobacco Comments   Quit over 40 years ago    Goals Met:  Independence with exercise equipment Exercise tolerated well No report of concerns or symptoms today  Goals Unmet:  Not Applicable  Comments: Pt able to follow exercise prescription today without complaint.  Will continue to monitor for progression.    Dr. Oneil Pinal is Medical Director for [facility].  Dr. Fuad Aleskerov is Medical Director for [facility].

## 2023-03-15 ENCOUNTER — Ambulatory Visit: Payer: Medicare HMO | Attending: Family Medicine

## 2023-03-15 DIAGNOSIS — R2689 Other abnormalities of gait and mobility: Secondary | ICD-10-CM | POA: Insufficient documentation

## 2023-03-15 DIAGNOSIS — M6281 Muscle weakness (generalized): Secondary | ICD-10-CM | POA: Insufficient documentation

## 2023-03-15 NOTE — Therapy (Signed)
 OUTPATIENT PHYSICAL THERAPY LOWER EXTREMITY TREATMENT/PROGRESS NOTE/ Re-certification through 03/18/23    Patient Name: Devin [name] [facility]. MRN: [id number] DOB:[birth year], 78 y.o., male Today's Date: 02/13/23  END OF SESSION:  PT End of Session - 03/15/23 1109     Visit Number 32    Number of Visits 36    Date for PT Re-Evaluation 03/18/23    Authorization Type re-cert/PN done today x 4 weeks (6 additional visits)- planning to transition to HEP and DC by 03/18/23    PT Start Time 1100    PT Stop Time 1148    PT Time Calculation (min) 48 min    Equipment Utilized During Treatment Gait belt    Activity Tolerance Patient tolerated treatment well    Behavior During Therapy WFL for tasks assessed/performed                  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   Hypertension    Ischemic cardiomyopathy    MI, old    Peripheral neuropathy    PFO (patent foramen ovale) 05/2019   s/p repair   Sleep apnea    Spinal stenosis of lumbar region    TIA (transient ischemic attack)    Past Surgical History:  Procedure Laterality Date   APPENDECTOMY     BREAST SURGERY Right    benign mass   CARDIOVERSION Right 12/15/2019   Procedure: CARDIOVERSION;  Surgeon: Fernand Denyse LABOR, MD;  Location: [facility] ORS;  Service: Cardiovascular;  Laterality: Right;   CATARACT EXTRACTION, BILATERAL     COLONOSCOPY     CORONARY ANGIOPLASTY WITH STENT PLACEMENT     CORONARY ARTERY BYPASS GRAFT  05/2019   LIMA-LAD   CORONARY STENT INTERVENTION N/A 10/15/2022   Procedure: CORONARY STENT INTERVENTION;  Surgeon: Ammon Blunt, MD;  Location:  [facility] INVASIVE CV LAB;  Service: Cardiovascular;  Laterality: N/A;   LEFT HEART CATH N/A 10/15/2022   Procedure: Left Heart Cath;  Surgeon: Ammon Blunt, MD;  Location: [facility] INVASIVE CV LAB;  Service: Cardiovascular;  Laterality: N/A;   LEFT HEART CATH AND CORS/GRAFTS ANGIOGRAPHY N/A 05/21/2019   Procedure: LEFT HEART CATH AND CORONARY ANGIOGRAPHY;  Surgeon: Fernand Denyse LABOR, MD;  Location: [facility] INVASIVE CV LAB;  Service: Cardiovascular;  Laterality: N/A;   LUMBAR LAMINECTOMY/DECOMPRESSION MICRODISCECTOMY N/A 07/04/2020   Procedure: L4-5 LAMINECTOMY;  Surgeon: Bluford Standing, MD;  Location: [facility] ORS;  Service: Neurosurgery;  Laterality: N/A;   REPLACEMENT TOTAL KNEE BILATERAL     Patient Active Problem List   Diagnosis Date Noted   NSTEMI (non-ST elevated myocardial infarction) (HCC) 10/13/2022   HFrEF (heart failure with reduced ejection fraction) (HCC) 10/13/2022   Degenerative joint disease (DJD) of lumbar spine 10/12/2022   Frequent falls 05/15/2022   Unsteady gait 04/16/2022   Acute delirium 04/13/2022   Electrolyte abnormality 04/13/2022   Nausea & vomiting 04/13/2022   PAD (peripheral artery disease) (HCC) 09/03/2021   Localized, primary osteoarthritis 11/24/2020   Wound healing, delayed 09/08/2020   History of lumbar surgery 09/08/2020   Chronic ischemic heart disease 07/13/2020  History of MI (myocardial infarction) 07/13/2020   History of PTCA 1 07/13/2020   Other personal history presenting hazards to health 07/13/2020   Pure hypercholesterolemia 07/13/2020   Elevated lactic acid level 04/19/2020   Generalized weakness 04/09/2020   Fall at home 04/09/2020   Gastroenteritis 04/09/2020   AMS (altered mental status) 04/08/2020   Hypoxia 02/01/2020   Atrial fibrillation status post cardioversion (HCC) 12/22/2019   Congestive heart failure (CHF) (HCC) 12/13/2019   Hyponatremia 12/06/2019   Acute postoperative anemia due to expected blood loss 05/28/2019   Hyperlipidemia  05/28/2019   Ischemic cardiomyopathy 05/28/2019   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   Unstable angina (HCC) 05/24/2019   Chest pain 05/21/2019   Ischemic chest pain (HCC) 05/16/2019   CAD (coronary artery disease) 05/16/2019   HTN (hypertension) 05/16/2019   Depression 05/16/2019   GERD (gastroesophageal reflux disease) 05/16/2019   Verruca plantaris 01/23/2018   Obstructive sleep apnea syndrome 01/20/2018   Major depressive disorder, single episode, moderate (HCC) 07/05/2016   Mixed sensory-motor polyneuropathy 05/17/2016   Controlled type 2 diabetes mellitus without complication, without long-term current use of insulin  (HCC) 12/19/2014   Renal cyst, left 11/11/2013   AAA (abdominal aortic aneurysm) (HCC) 10/01/2012   Osteoarthritis of knee 01/29/2012   Obesity 08/15/2011    PCP: Dr. Eliverto, MD  REFERRING PROVIDER: Dr. Eliverto, MD/ [name], [facility]  REFERRING DIAG:  R26.9 (ICD-10-CM) - Unspecified abnormalities of gait and mobility  R26.89 (ICD-10-CM) - Other abnormalities of gait and mobility  R29.6 (ICD-10-CM) - Repeated falls    THERAPY DIAG:  Muscle weakness (generalized)  Balance problem  Rationale for Evaluation and Treatment: Rehabilitation  ONSET DATE: at least 2 years  SUBJECTIVE: (From initial evaluation note)  SUBJECTIVE STATEMENT: Pt reports he is having difficulty with gait and balance.  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   He feels like his balance and walking difficulty have worsened recently.  He reports he started falling frequently, he was catching his foot on things and tripping.  The most recent major fall was about 6 months ago.  He is being careful since then and trying to walk more to get stronger and trying to be more aware of his gait pattern.  He was walking with a walker until ~4 months ago, then started using a cane, and  then has tried walking without any assistance.      He feels like his difficulty walking limits his ability to get out of the house as much as he used to because he is fearful of falling.  PLOF- he enjoys getting out of the house for social activities, going out to breakfast too; going grocery shopping a few times/week to get his groceries; prior to his back surgery he walked at the park in [place], hasn't been back though because he is worried about falling though.  He has a home aide that comes a few days a week to assist with household tasks as needed.  Also reports he had a MI a little over a week ago and was in the hospital (8/3-10/16/22)- he states he is referred to begin cardiac rehab as part of his outpatient recovery.  PERTINENT HISTORY: Extensive cardiac history History of lumbar spine surgery- 2022 L4-5 microdiscectomy/laminectomy   PAIN:  Are you having pain? R groin muscle area where he had the cardiac catheterization procedure last week.  PRECAUTIONS: Fall  RED FLAGS: Pt with recent MI (10/13/22) with stent placement (see chart review)  WEIGHT BEARING RESTRICTIONS: No  FALLS:  Has patient fallen in last 6 months? Yes. Number of falls multiple  LIVING ENVIRONMENT: Lives with: lives alone (has a self reported lady friend for social support and enjoys being able to get out the house for community activities, going to restaurants together) Lives in: House/apartment Stairs:  he lives in a single level home with bonus room over the garage, but he does not need to go upstairs.1 step to enter in the garage- able to get in without falling. Has following equipment at home: Single point cane, Walker - 4 wheeled, and Wheelchair (manual) Has a home health aide that comes 2-3x/week if he needs assistance  OCCUPATION: retired  PLOF: Independent  PATIENT GOALS: to address drop foot and be able to manage it better; to strengthen his legs; and to increase his breathing; and improve his  balance   NEXT MD VISIT: yes, next week   OBJECTIVE:   DIAGNOSTIC FINDINGS: no recent lower extremity/spine imaging  PATIENT SURVEYS:  FOTO 39/52  COGNITION: Overall cognitive status: Within functional limits for tasks assessed     SENSATION: Pt reports reduced sensation to light touch L L4-5 dermatomes  EDEMA:  No pitting edema noted on dorsum of foot   POSTURE: pt stands with wide base of support for balance; uses UE support on LE's for sit to stand transfer  PALPATION: (-) TTP distal LE b/l  LOWER EXTREMITY ROM:  Active ROM Right eval Left eval  Hip flexion WNL WNL  Hip extension    Hip abduction    Hip adduction    Hip internal rotation    Hip external rotation    Knee flexion 105 105  Knee extension -5 -5  Ankle dorsiflexion 5 deg PROM 0 deg  Ankle plantarflexion    Great toe extension 45 deg PROM 40 deg   Ankle eversion     (Blank rows = not tested)  LOWER EXTREMITY MMT:  MMT Right eval Left eval  Hip flexion 4 4  Hip extension    Hip abduction    Hip adduction    Hip internal rotation    Hip external rotation    Knee flexion 5 5  Knee extension 4 4  Ankle dorsiflexion 4/5 1/5  Ankle plantarflexion 3/5 3/5  Great toe extension 4/5 2/5  Ankle eversion 4/5 3/5   (Blank rows = not tested)   FUNCTIONAL TESTS:  OUTCOME MEASURES: TEST Outcome Interpretation  5 times sit<>stand 21 seconds >60 yo, >15 sec indicates increased risk for falls  10 meter walk test deferred <1.0 m/s indicates increased risk for falls; limited community ambulator  [employer] Assessment /56   deferred <36/56 (100% risk for falls), 37-45 (80% risk for falls); 46-51 (>50% risk for falls); 52-55 (lower risk <25% of falls)      GAIT: Distance walked: from waiting area into PT gym 50 ft, PT supervision Assistive device utilized: None Level of assistance: Complete Independence Comments: Pt ambulates with decreased gait speed, wide BOS, decreased stride length, excess L  hip flexion strategy to compensate for L foot drop  Pt not able to stand SLS on L without UE support today; able to stand R LE x 5 seconds  TODAY'S TREATMENT:  DATE: 03/15/23 Subjective:  Pt reports he walked a little in his neighborhood.  He feels stiff today upon arrival.  He doesn't feel confident in his knee flexibility to be able to get up from the floor easily.  Pain: 0/10  Objective: Pt updated PT goals: continue working on improving LE strength, practice floor transfers to reduce fear of falling, and improve confidence and ability to ascend/descend the flight of stairs to his second level in his home which he has been avoiding  Therapeutic Exercises:  While wearing L AFO, no SPC during session, gait belt and PT supervision/SBA during standing exercises  Nustep x 10 min, level 3-4 for knee mobility/LE strengthening at start of session, PT obtained hx/subjective portion of session today during this too  Knee AROM measurement in sitting: R knee 92 degrees, L 97 degrees; assessed soft tissue mobility along distal quads b/l (pt with h/o TKA >10 y/o); typical tightness palpated in soft tissue along ant/distal vastus lateralis  Pt education for LLLD type end range stretches as an option to possibly improve ROM slightly which could help improve his ability to navigate stairs and also perform half kneel to stand transfers.  Knee flexion stretch with foot on step: R and L 5x ea, x15-20 seconds ea  Split squat: in parallel bars, L foot in front 3 sets to fatigue (12-15 reps), R foot in front 3 sets to fatigue (6-7 reps)  Transfer training: sit to supine and supine to sit on blue mat table, pt education regarding technique and how this movement pattern is relevant for both bed mobility and also as one portion of floor to standing training.  Practiced 3x.  Added rolling to R  and rolling to L 3x ea.  Added sidelying to quadruped transfer and then quadruped to R sidelying 3x.  Today worked on eccentric lowering to floor with R L back and L LE (stronger leg) front, using back of chain on L side and R forearm on parallel bar (simulated edge of couch or counter) and then getting back up.  PT with gait belt and SBA for this activity.  Verbal cues for technique/visual cues for technique implemented into training.  15 minutes   Not today: Sit to stand 2x5 (5# weight held at chest), no use of UE  Seated knee extension: 5# ankle weights to fatigue, 2x20 ea LE  Seated marches: 5# ankle weights to fatigue, 2x20 ea LE  Forward stepping with PT external cue to tap R or L foot on different color cones- to practice reaction time/motor planning for balance- 4 laps in hallway Stair training: pt ascend 4 stairs with b/l railing using step-to technique leading up with L LE and down with R LE.  Practiced 4x as one of his updated PT goals is to be able to ascend/descend his stairs to his second level at home.  Walking 5 min without SPC on flat surface- pt is wearing new shoes today so practiced achieving heel strike and also  Pt education regarding benefits of wide toe but, but also how this sole will decrease his stability bc of decreased BOS during static standing  Squat to pick up cones from floor- x 5 Front step ups: R LE 2x6, L LE 2x6 with min UE support  Hurdles: 6 inch height, without SPC today; 4 laps forward- not today  Nautilus: 30 lbs resistance around trunk/pelvis- not today Lateral walking- 4 laps R, 4 laps L Forward/reverse- 4 laps  Obstacle course: stepping over yoga blocks,  on/off/balancing on airex: 6 laps- practiced without PT hand hold support 4 laps- not today  Amb CW/CCW 2 laps in gym each while tossing ball/catch ball vertically (3 # med ball) dual tasking; PT SBA  Amb tossing ball R/L to PT while amb CW and CWW 2 laps each in gym (rainbow ball) dual tasking;  PT SBA  Weave in/out of cones on floor without SPC- PT supervision 4 laps to facilitate navigating obstacles in crowded room/space   Ball toss at trampoline, standing on airex (5 lb) 3x10 with PT SBA- to practice reaction to external stimuli/balance training  Training for amb on uneven outdoor surfaces- incline/decline sidewalk; grass forward/reverse/lateral, curb situation navigation with [facility]- practice stepping up/down curb using SPC in R hand for scenarios where there may not be a railing; stepped down with L LE and pt prefers stepping up with L LE as SPC is in R hand. (5 min) Not today  Walk 1 loop around the building with PT supervision/gait belt- used his SPC, focused on navigating uneven/ramp/inclined/declined sidewalk surfaces.  No major loss of balance. Not today Side stepping forward/laterally/reverse over airex balance beam in confined small space, without UE support- to facilitate using stepping strategy for balance in crowded spaces- 6 laps in parallel bars  Side stepping ON airex without UE support in parallel bars 4 laps  Step ups onto 6 inch step: with 2 finger support ea UE; x10 lead with L LE, x10 leading with R LE   Star drill: PT verbal cue for R or L and step/weight shift forward/lateral/diagonal outside base of support then back to midline- multiple directions/rounds x 4 min with PT close supervision  Squat lift 5 cones from low surface (cones resting on 6 inch step, hand cones to PT then put cones back on the floor- 2 rounds Standing alternating toe taps to green cones: sets of 12 alternating- PT supervision with gait belt- not today  Alternating step ups on airex: x6 lead with R and x6 lead with L- in parallel bars- not today  PT SBA with gait belt: Not today High knee marches forward through blue ladder x 2 laps- not today  High knee lateral marches through ladder x 2 laps- not today  Diagonal ladder x 2 laps- not today  Reverse walking 4x 10 m- not  today  Obstacle course in parallel bars: not today Step up/on/off airex, over 6 inch hurdle, cone toe taps R/L x5, step around cone in small space, step up/on/off airex- repeat opposite direction -Hurdles: forward direction in // bars (PT supervision with gait belt) 6 laps, focused on hip flexion strategy for foot clearance; attempted cue for lift toes but pt not able to actively perform active DF of ankle against gravity -Side step over hurdles in // bars: 6 laps -Airex: feet together, 3 trials x 30-40 seconds with removing hand support -Airex: tandem stance, 3 trials each R in front/L in front x 20-30 seconds removing hand support (pt notes ankle/lower leg fatigue after this)   PATIENT EDUCATION:  Education details: PT POC/goals, importance of attending cardiac rehab, activity pacing Person educated: Patient Education method: Explanation Education comprehension: verbalized understanding and needs further education  HOME EXERCISE PROGRAM: Access Code: [email or web address] URL: [email or web address] Date: 10/24/2022 Prepared by: Vernell Reges  Exercises - Seated Heel Raise  - 1 x daily - 3 x weekly - 2 sets - 20 reps - Seated March  - 1 x daily - 3 x weekly - 2 sets - 10 reps -  Seated Long Arc Quad  - 1 x daily - 3 x weekly - 2 sets - 10 reps  ASSESSMENT:  CLINICAL IMPRESSION: Added knee flexion AROM/end range stretching into tx plan today to address ROM limitations and facilitate improved ability to navigate stairs and to perform transfers from half kneel to standing safely as part of skilled training for fall recovery in today's session and future PT sessions.  Pt remains challenged with split squat, relies heavily on UE support.  During full transfer down and up from the floor in half kneeling PT CGA required and verbal cues required for pt to perform safely today.  He leaned heavily to the R during this descent.  Patient will continue to benefit from skilled PT intervention  including LE strengthening and transfer training to address listed impairments, to decrease fall risk and improve overall mobility and safety during ambulation.  Likely has not met maximum recovery with skilled PT.    OBJECTIVE IMPAIRMENTS: Abnormal gait, cardiopulmonary status limiting activity, decreased activity tolerance, decreased balance, decreased mobility, difficulty walking, decreased ROM, and decreased strength.   ACTIVITY LIMITATIONS: standing, squatting, stairs, transfers, and locomotion level  PARTICIPATION LIMITATIONS: meal prep, cleaning, laundry, interpersonal relationship, shopping, and community activity  PERSONAL FACTORS: Age, Past/current experiences, Time since onset of injury/illness/exacerbation, and cardiovascular hx including recent MI with stent placement last week  are also affecting patient's functional outcome.   REHAB POTENTIAL: Good  CLINICAL DECISION MAKING: Stable/uncomplicated  EVALUATION COMPLEXITY: Low   GOALS: Goals reviewed with patient? Yes  SHORT TERM GOALS: Target date: 11/11/22 Pt will be able to perform a HEP for LE strengthening >3x/week Baseline: 11/27/22 pt has been instructed on a HEP; 12/24/22: met Goal status: Met   LONG TERM GOALS: Target date: 03/18/23  Improve FOTO to >51 indicating pt able to perform his daily activities without being limited by his balance; 12/24/22: in progress Baseline: 39; 10/7: 54; 01/21/23 51; 02/18/23: in progress Goal status: In Progress  2.  Pt will be able to amb with typical width BOS and b/l heel strike gait pattern using least restrictive AD and AFO on flat/incline/decline/grass surfaces x 10 min Baseline: not using any AD or AFO in clinic, pt wide BOS, no L heel strike due to foot drop; 11/27/22 pt is amb with SPC now, discussions continue regarding AFO/brace for improved safety; 12/24/22: pt now wearing AFO and using SPC on uneven surfaces with PT SBA x 5 minutes; 01/21/23: in progress- able to amb  intervals of 5-6 min x 2 with 1-2 min sitting break between Goal status: In progress  3.  Pt will be able to perform 5x STS in <15 sec indicating reduced risk for falling Baseline: 21 sec, 11/27/22 19 sec (with mild UE use); 12/28/22: 18 seconds  (with mild UE support) 01/21/23:  19 seconds without UE support; 02/18/23 17 seconds without UE support Goal status: In Progress  4. Pt will be able to amb x 1 block distance to walk his dog 1x/day.  Baseline: 1/4-1/2 block (pt self report), 01/21/23: amb 1 loop around building in 5.25 minutes at clinic  Goal status: In progress  5. Improve 10 M walk test to >1.0 m/s to promote reduced fall risk and improved community ambulator status  Baseline: 12/24/22 .9 m/s; 01/21/23: .8 m/s (at end of session, was fatigued)  Goal status: In progress  6. Pt will independently be able to transfer stand to floor and floor to standing with use of chair/sturdy surface to simulate being able  to get up from floor and to reduce fear/risk of falling  Baseline: pt has not tried and expresses fear of not being able to get up from floor living alone  Goal status: new   PLAN:  PT FREQUENCY: 1-2x/week  PT DURATION: 4 weeks  PLANNED INTERVENTIONS: Therapeutic exercises, Therapeutic activity, Neuromuscular re-education, Balance training, Gait training, Patient/Family education, Self Care, Joint mobilization, and Orthotic/Fit training  PLAN FOR NEXT SESSION: continue with proximal hip and LE strengthening, multidirectional movements/dynamic balance activities, improving confidence/reducing fear of falling through activities in small spaces and that require increased reaction speed to unpredictable external stimuli; fall recovery/floor transfers training; added knee flexion stretch on step to HEP today- 5x 20 seconds daily as performed in clinic this morning  Vernell Reges, PT, DPT, OCS  Physical Therapist - [facility]   Vernell BRAVO  [place], [place] 02/13/23 12:01 PM

## 2023-03-18 ENCOUNTER — Ambulatory Visit: Payer: Medicare HMO

## 2023-03-18 DIAGNOSIS — M6281 Muscle weakness (generalized): Secondary | ICD-10-CM

## 2023-03-18 DIAGNOSIS — R2689 Other abnormalities of gait and mobility: Secondary | ICD-10-CM

## 2023-03-18 NOTE — Therapy (Signed)
 OUTPATIENT PHYSICAL THERAPY LOWER EXTREMITY TREATMENT/PROGRESS NOTE/ Re-certification through 04/08/23    Patient Name: [name] MRN: [id number] DOB:October 28, 1945, 78 y.o., male Today's Date: 02/13/23  END OF SESSION:  PT End of Session - 03/18/23 1155     Visit Number 33    Number of Visits 42    Date for PT Re-Evaluation 04/08/23    Authorization Type re-cert/PN done today x 3 weeks (6 additional visits)- planning to transition to HEP 04/08/23    PT Start Time 1200    PT Stop Time 1251    PT Time Calculation (min) 51 min    Equipment Utilized During Treatment Gait belt    Activity Tolerance Patient tolerated treatment well    Behavior During Therapy WFL for tasks assessed/performed                  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   Hypertension    Ischemic cardiomyopathy    MI, old    Peripheral neuropathy    PFO (patent foramen ovale) 05/2019   s/p repair   Sleep apnea    Spinal stenosis of lumbar region    TIA (transient ischemic attack)    Past Surgical History:  Procedure Laterality Date   APPENDECTOMY     BREAST SURGERY Right    benign mass   CARDIOVERSION Right 12/15/2019   Procedure: CARDIOVERSION;  Surgeon: Fernand Denyse LABOR, MD;  Location: [facility] ORS;  Service: Cardiovascular;  Laterality: Right;   CATARACT EXTRACTION, BILATERAL     COLONOSCOPY     CORONARY ANGIOPLASTY WITH STENT PLACEMENT     CORONARY ARTERY BYPASS GRAFT  05/2019   LIMA-LAD   CORONARY STENT INTERVENTION N/A 10/15/2022   Procedure: CORONARY STENT INTERVENTION;  Surgeon: Ammon Blunt, MD;  Location: [facility]  INVASIVE CV LAB;  Service: Cardiovascular;  Laterality: N/A;   LEFT HEART CATH N/A 10/15/2022   Procedure: Left Heart Cath;  Surgeon: Ammon Blunt, MD;  Location: [facility] INVASIVE CV LAB;  Service: Cardiovascular;  Laterality: N/A;   LEFT HEART CATH AND CORS/GRAFTS ANGIOGRAPHY N/A 05/21/2019   Procedure: LEFT HEART CATH AND CORONARY ANGIOGRAPHY;  Surgeon: Fernand Denyse LABOR, MD;  Location: [facility] INVASIVE CV LAB;  Service: Cardiovascular;  Laterality: N/A;   LUMBAR LAMINECTOMY/DECOMPRESSION MICRODISCECTOMY N/A 07/04/2020   Procedure: L4-5 LAMINECTOMY;  Surgeon: Bluford Standing, MD;  Location: [facility] ORS;  Service: Neurosurgery;  Laterality: N/A;   REPLACEMENT TOTAL KNEE BILATERAL     Patient Active Problem List   Diagnosis Date Noted   NSTEMI (non-ST elevated myocardial infarction) (HCC) 10/13/2022   HFrEF (heart failure with reduced ejection fraction) (HCC) 10/13/2022   Degenerative joint disease (DJD) of lumbar spine 10/12/2022   Frequent falls 05/15/2022   Unsteady gait 04/16/2022   Acute delirium 04/13/2022   Electrolyte abnormality 04/13/2022   Nausea & vomiting 04/13/2022   PAD (peripheral artery disease) (HCC) 09/03/2021   Localized, primary osteoarthritis 11/24/2020   Wound healing, delayed 09/08/2020   History of lumbar surgery 09/08/2020   Chronic ischemic heart disease 07/13/2020   History  of MI (myocardial infarction) 07/13/2020   History of PTCA 1 07/13/2020   Other personal history presenting hazards to health 07/13/2020   Pure hypercholesterolemia 07/13/2020   Elevated lactic acid level 04/19/2020   Generalized weakness 04/09/2020   Fall at home 04/09/2020   Gastroenteritis 04/09/2020   AMS (altered mental status) 04/08/2020   Hypoxia 02/01/2020   Atrial fibrillation status post cardioversion (HCC) 12/22/2019   Congestive heart failure (CHF) (HCC) 12/13/2019   Hyponatremia 12/06/2019   Acute postoperative anemia due to expected blood loss 05/28/2019   Hyperlipidemia  05/28/2019   Ischemic cardiomyopathy 05/28/2019   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   Unstable angina (HCC) 05/24/2019   Chest pain 05/21/2019   Ischemic chest pain (HCC) 05/16/2019   CAD (coronary artery disease) 05/16/2019   HTN (hypertension) 05/16/2019   Depression 05/16/2019   GERD (gastroesophageal reflux disease) 05/16/2019   Verruca plantaris 01/23/2018   Obstructive sleep apnea syndrome 01/20/2018   Major depressive disorder, single episode, moderate (HCC) 07/05/2016   Mixed sensory-motor polyneuropathy 05/17/2016   Controlled type 2 diabetes mellitus without complication, without long-term current use of insulin  (HCC) 12/19/2014   Renal cyst, left 11/11/2013   AAA (abdominal aortic aneurysm) (HCC) 10/01/2012   Osteoarthritis of knee 01/29/2012   Obesity 08/15/2011    PCP: Dr. Eliverto, MD  REFERRING PROVIDER: Dr. Eliverto, MD/ Devin [name], [facility]  REFERRING DIAG:  R26.9 (ICD-10-CM) - Unspecified abnormalities of gait and mobility  R26.89 (ICD-10-CM) - Other abnormalities of gait and mobility  R29.6 (ICD-10-CM) - Repeated falls    THERAPY DIAG:  Muscle weakness (generalized)  Balance problem  Rationale for Evaluation and Treatment: Rehabilitation  ONSET DATE: at least 2 years  SUBJECTIVE: (From initial evaluation note)  SUBJECTIVE STATEMENT: Pt reports he is having difficulty with gait and balance.  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   He feels like his balance and walking difficulty have worsened recently.  He reports he started falling frequently, he was catching his foot on things and tripping.  The most recent major fall was about 6 months ago.  He is being careful since then and trying to walk more to get stronger and trying to be more aware of his gait pattern.  He was walking with a walker until ~4 months ago, then started using a cane, and  then has tried walking without any assistance.      He feels like his difficulty walking limits his ability to get out of the house as much as he used to because he is fearful of falling.  PLOF- he enjoys getting out of the house for social activities, going out to breakfast too; going grocery shopping a few times/week to get his groceries; prior to his back surgery he walked at the park in [place], hasn't been back though because he is worried about falling though.  He has a home aide that comes a few days a week to assist with household tasks as needed.  Also reports he had a MI a little over a week ago and was in the hospital (8/3-10/16/22)- he states he is referred to begin cardiac rehab as part of his outpatient recovery.  PERTINENT HISTORY: Extensive cardiac history History of lumbar spine surgery- 2022 L4-5 microdiscectomy/laminectomy   PAIN:  Are you having pain? R groin muscle area where he had the cardiac catheterization procedure last week.  PRECAUTIONS: Fall  RED FLAGS: Pt with recent MI (10/13/22) with stent placement (see chart review)  WEIGHT BEARING RESTRICTIONS: No  FALLS:  Has patient fallen in last 6 months? Yes. Number of falls multiple  LIVING ENVIRONMENT: Lives with: lives alone (has a self reported lady friend for social support and enjoys being able to get out the house for community activities, going to restaurants together) Lives in: House/apartment Stairs:  he lives in a single level home with bonus room over the garage, but he does not need to go upstairs.1 step to enter in the garage- able to get in without falling. Has following equipment at home: Single point cane, Walker - 4 wheeled, and Wheelchair (manual) Has a home health aide that comes 2-3x/week if he needs assistance  OCCUPATION: retired  PLOF: Independent  PATIENT GOALS: to address drop foot and be able to manage it better; to strengthen his legs; and to increase his breathing; and improve his  balance   NEXT MD VISIT: yes, next week   OBJECTIVE:   DIAGNOSTIC FINDINGS: no recent lower extremity/spine imaging  PATIENT SURVEYS:  FOTO 39/52  COGNITION: Overall cognitive status: Within functional limits for tasks assessed     SENSATION: Pt reports reduced sensation to light touch L L4-5 dermatomes  EDEMA:  No pitting edema noted on dorsum of foot   POSTURE: pt stands with wide base of support for balance; uses UE support on LE's for sit to stand transfer  PALPATION: (-) TTP distal LE b/l  LOWER EXTREMITY ROM:  Active ROM Right eval Left eval  Hip flexion WNL WNL  Hip extension    Hip abduction    Hip adduction    Hip internal rotation    Hip external rotation    Knee flexion 105 105  Knee extension -5 -5  Ankle dorsiflexion 5 deg PROM 0 deg  Ankle plantarflexion    Great toe extension 45 deg PROM 40 deg   Ankle eversion     (Blank rows = not tested)  LOWER EXTREMITY MMT:  MMT Right eval Left eval  Hip flexion 4 4  Hip extension    Hip abduction    Hip adduction    Hip internal rotation    Hip external rotation    Knee flexion 5 5  Knee extension 4 4  Ankle dorsiflexion 4/5 1/5  Ankle plantarflexion 3/5 3/5  Great toe extension 4/5 2/5  Ankle eversion 4/5 3/5   (Blank rows = not tested)   FUNCTIONAL TESTS:  OUTCOME MEASURES: TEST Outcome Interpretation  5 times sit<>stand 21 seconds >60 yo, >15 sec indicates increased risk for falls  10 meter walk test deferred <1.0 m/s indicates increased risk for falls; limited community ambulator  [employer] Assessment /56   deferred <36/56 (100% risk for falls), 37-45 (80% risk for falls); 46-51 (>50% risk for falls); 52-55 (lower risk <25% of falls)      GAIT: Distance walked: from waiting area into PT gym 50 ft, PT supervision Assistive device utilized: None Level of assistance: Complete Independence Comments: Pt ambulates with decreased gait speed, wide BOS, decreased stride length, excess L  hip flexion strategy to compensate for L foot drop  Pt not able to stand SLS on L without UE support today; able to stand R LE x 5 seconds  TODAY'S TREATMENT:  DATE: 03/18/23 Subjective:  Pt reports he feels like PT has been helping; he is getting stronger and walking without his cane for short distances; he would like to feel more confident and be able to walk longer distances again without his cane; he also would like to have the strength to safely ascend/descend his stairs to second level in his home; he would like to feel more confident getting up from the floor to address his fear of falling; he would like to feel more confident walking in crowded environments; he would like to reinstate his gym membership so he can transition to independently exercising at the gym when he finishes PT because he does not feel like he will continue at home, being in a more social environment feels better for long term adherence to exercise.  Pain: 0/10  Objective: Pt updated his PT goals: continue working on improving LE strength to facilitate improved walking endurance, improve LE strength to be able to ascend/descend the flight of stairs in his home to get to rooms on his second level of home; continue practicing floor transfers to reduce fear of falling, and improve confidence and ability to ascend/descend the flight of stairs to his second level in his home which he has been avoiding  Objective Measures: R knee flexion AROM: 92 degrees L knee flexion AROM: 97 degrees  FOTO: 51 today  Transfers: pt able to independently perform supine to sidelying to sit to quadruped; not able to transfer quadruped to tall half kneeling independently yet due to decreased knee/LE AROM, requires CGA with PT and use of UE on furniture to assist from tall half kneeling to standing  5x STS 19 seconds  without UE support  Therapeutic Exercises:  While wearing L AFO, no SPC during session, gait belt and PT supervision during standing exercises  Nustep x 10 min, level 3-4 for knee mobility/LE strengthening at start of session, PT obtained hx/subjective portion of session today during this too  Knee flexion stretch with foot on step: R and L 5x ea, x15-20 seconds ea  Front step ups: L LE x10, R LE 2x6  Split squat: in parallel bars, L foot in front 3 sets to fatigue (12-15 reps), R foot in front 3 sets to fatigue (6-7 reps)  Squats: holding 5 lb medicine ball 3x8  Airex: Ball toss at trampoline, standing on airex (5 lb) 3x10 with PT SBA- to practice reaction to external stimuli/balance training  Hurdles: tall/short alternating, forward direction, 3 laps, pt with intermittent hand hold L with PT as he became more fatigued   PATIENT EDUCATION:  Education details: PT POC/goals, importance of attending cardiac rehab, activity pacing Person educated: Patient Education method: Explanation Education comprehension: verbalized understanding and needs further education  HOME EXERCISE PROGRAM: Access Code: [email or web address] URL: [email or web address] Date: 10/24/2022 Prepared by: Vernell Reges  Exercises - Seated Heel Raise  - 1 x daily - 3 x weekly - 2 sets - 20 reps - Seated March  - 1 x daily - 3 x weekly - 2 sets - 10 reps - Seated Long Arc Quad  - 1 x daily - 3 x weekly - 2 sets - 10 reps  ASSESSMENT:  CLINICAL IMPRESSION: Updated PT and added additional goals for POC today.  Pt continues to make progress towards PT goals.  He is working on the movement components to functionally be able to perform transfers independently to get up/down from floor to reduce his risk and fear of falling.  He would benefit  from continued skilled PT to address knee ROM deficits and LE strength/endurance deficits and balance deficitis to facilitate improved ability to ambulate longer distances  without his [facility] and a newly added goal (as he has been making progress) of being able to ascend/descend his flight of stairs in his home safely and independently to access second story rooms in his house as he lives alone and does not have another person to access items up there.  Likely has not met maximum recovery with skilled PT.  I did discuss a goal for transitioning to a regular strengthening program at the gym to continue exercising long term when he completes this course of PT.  Anticipate transition to HEP/DC within next few weeks.   OBJECTIVE IMPAIRMENTS: Abnormal gait, cardiopulmonary status limiting activity, decreased activity tolerance, decreased balance, decreased mobility, difficulty walking, decreased ROM, and decreased strength.   ACTIVITY LIMITATIONS: standing, squatting, stairs, transfers, and locomotion level  PARTICIPATION LIMITATIONS: meal prep, cleaning, laundry, interpersonal relationship, shopping, and community activity  PERSONAL FACTORS: Age, Past/current experiences, Time since onset of injury/illness/exacerbation, and cardiovascular hx including recent MI with stent placement last week  are also affecting patient's functional outcome.   REHAB POTENTIAL: Good  CLINICAL DECISION MAKING: Stable/uncomplicated  EVALUATION COMPLEXITY: Low   GOALS: Goals reviewed with patient? Yes  SHORT TERM GOALS: Target date: 11/11/22 Pt will be able to perform a HEP for LE strengthening >3x/week Baseline: 11/27/22 pt has been instructed on a HEP; 12/24/22: met Goal status: Met   LONG TERM GOALS: Target date: 04/08/23  Improve FOTO to >51 indicating pt able to perform his daily activities without being limited by his balance; 12/24/22: in progress Baseline: 39; 10/7: 54; 01/21/23 51; 02/18/23: in progress; 03/18/23: 51 Goal status: In Progress  2.  Pt will be able to amb with typical width BOS and b/l heel strike gait pattern using least restrictive AD and AFO on  flat/incline/decline/grass surfaces x 10 min Baseline: not using any AD or AFO in clinic, pt wide BOS, no L heel strike due to foot drop; 11/27/22 pt is amb with SPC now, discussions continue regarding AFO/brace for improved safety; 12/24/22: pt now wearing AFO and using SPC on uneven surfaces with PT SBA x 5 minutes; 01/21/23: in progress- able to amb intervals of 5-6 min x 2 with 1-2 min sitting break between; 03/18/23: using SPC for >5 min on incline up driveway/neighborhood, he would like to be able to do without SPC again as this was PLOF Goal status: In progress  3.  Pt will be able to perform 5x STS in <15 sec indicating reduced risk for falling Baseline: 21 sec, 11/27/22 19 sec (with mild UE use); 12/28/22: 18 seconds  (with mild UE support) 01/21/23:  19 seconds without UE support; 02/18/23 17 seconds without UE support; 03/18/23: 19 seconds without UE support Goal status: In Progress  4. Pt will be able to amb x 1 block distance to walk his dog 1x/day.  Baseline: 1/4-1/2 block (pt self report), 01/21/23: amb 1 loop around building in 5.25 minutes at clinic; 03/18/23- did not assess due to inclement weather  Goal status: In progress  5. Improve 10 M walk test to >1.0 m/s to promote reduced fall risk and improved community ambulator status  Baseline: 12/24/22 .9 m/s; 01/21/23: .8 m/s (at end of session, was fatigued)  Goal status: In progress   6. Pt will independently be able to transfer stand to floor and floor to standing with use of chair/sturdy surface  to simulate being able to get up from floor and to reduce fear/risk of falling  Baseline: pt has not tried and expresses fear of not being able to get up from floor living alone; 03/18/23: see above objective section of note; in progress  Goal status: In progress 7. Pt will be able to ascend 1 flight of stairs in his home 2x/day to be able to access second floor of his home independently as he lives at home alone  Baseline: 03/18/23 pt is not  doing, fear of falling, and notes feeling weak   Goal status: new   8. Pt will transition to independent exercise program 2-3x/week for strengthening at his local fitness center after completing this course of PT    Baseline: 03/18/23 pt states he will reinstate his membership this week   Goal status: new   PLAN:  PT FREQUENCY: 1-2x/week  PT DURATION: 3 weeks  PLANNED INTERVENTIONS: Therapeutic exercises, Therapeutic activity, Neuromuscular re-education, Balance training, Gait training, Patient/Family education, Self Care, Joint mobilization, and Orthotic/Fit training  PLAN FOR NEXT SESSION: continue with proximal hip and LE strengthening, multidirectional movements/dynamic balance activities, improving confidence/reducing fear of falling through activities in small spaces and that require increased reaction speed to unpredictable external stimuli; fall recovery/floor transfers training; updated PT and pt goals today; plan to transition to independent strengthening within the next month  Vernell Reges, PT, DPT, OCS  Physical Therapist - [facility]   Vernell FORBES Reges, PT 02/13/23 2:00 PM

## 2023-03-19 ENCOUNTER — Other Ambulatory Visit: Payer: Self-pay | Admitting: Cardiovascular Disease

## 2023-03-19 DIAGNOSIS — I5020 Unspecified systolic (congestive) heart failure: Secondary | ICD-10-CM

## 2023-03-19 DIAGNOSIS — I2510 Atherosclerotic heart disease of native coronary artery without angina pectoris: Secondary | ICD-10-CM

## 2023-03-19 DIAGNOSIS — I25700 Atherosclerosis of coronary artery bypass graft(s), unspecified, with unstable angina pectoris: Secondary | ICD-10-CM

## 2023-03-19 DIAGNOSIS — I4891 Unspecified atrial fibrillation: Secondary | ICD-10-CM

## 2023-03-19 DIAGNOSIS — I739 Peripheral vascular disease, unspecified: Secondary | ICD-10-CM

## 2023-03-20 ENCOUNTER — Ambulatory Visit: Payer: Medicare HMO

## 2023-03-20 DIAGNOSIS — M6281 Muscle weakness (generalized): Secondary | ICD-10-CM | POA: Diagnosis not present

## 2023-03-20 DIAGNOSIS — R2689 Other abnormalities of gait and mobility: Secondary | ICD-10-CM

## 2023-03-20 NOTE — Therapy (Signed)
 OUTPATIENT PHYSICAL THERAPY LOWER EXTREMITY TREATMENT/PROGRESS NOTE/ Re-certification through 04/08/23    Patient Name: Devin [name] [facility]. MRN: [id number] DOB:02-09-1946, 78 y.o., male Today's Date: 02/13/23  END OF SESSION:  PT End of Session - 03/20/23 1158     Visit Number 34    Number of Visits 42    Date for PT Re-Evaluation 04/08/23    Authorization Type re-cert/PN done today x 3 weeks (6 additional visits)- planning to transition to HEP 04/08/23    PT Start Time 1200    PT Stop Time 1245    PT Time Calculation (min) 45 min    Equipment Utilized During Treatment Gait belt    Activity Tolerance Patient tolerated treatment well    Behavior During Therapy WFL for tasks assessed/performed                  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   Hypertension    Ischemic cardiomyopathy    MI, old    Peripheral neuropathy    PFO (patent foramen ovale) 05/2019   s/p repair   Sleep apnea    Spinal stenosis of lumbar region    TIA (transient ischemic attack)    Past Surgical History:  Procedure Laterality Date   APPENDECTOMY     BREAST SURGERY Right    benign mass   CARDIOVERSION Right 12/15/2019   Procedure: CARDIOVERSION;  Surgeon: Fernand Denyse LABOR, MD;  Location: [facility] ORS;  Service: Cardiovascular;  Laterality: Right;   CATARACT EXTRACTION, BILATERAL     COLONOSCOPY     CORONARY ANGIOPLASTY WITH STENT PLACEMENT     CORONARY ARTERY BYPASS GRAFT  05/2019   LIMA-LAD   CORONARY STENT INTERVENTION N/A 10/15/2022   Procedure: CORONARY STENT INTERVENTION;  Surgeon: Ammon Blunt, MD;  Location: [facility]  INVASIVE CV LAB;  Service: Cardiovascular;  Laterality: N/A;   LEFT HEART CATH N/A 10/15/2022   Procedure: Left Heart Cath;  Surgeon: Ammon Blunt, MD;  Location: [facility] INVASIVE CV LAB;  Service: Cardiovascular;  Laterality: N/A;   LEFT HEART CATH AND CORS/GRAFTS ANGIOGRAPHY N/A 05/21/2019   Procedure: LEFT HEART CATH AND CORONARY ANGIOGRAPHY;  Surgeon: Fernand Denyse LABOR, MD;  Location: [facility] INVASIVE CV LAB;  Service: Cardiovascular;  Laterality: N/A;   LUMBAR LAMINECTOMY/DECOMPRESSION MICRODISCECTOMY N/A 07/04/2020   Procedure: L4-5 LAMINECTOMY;  Surgeon: Bluford Standing, MD;  Location: [facility] ORS;  Service: Neurosurgery;  Laterality: N/A;   REPLACEMENT TOTAL KNEE BILATERAL     Patient Active Problem List   Diagnosis Date Noted   NSTEMI (non-ST elevated myocardial infarction) (HCC) 10/13/2022   HFrEF (heart failure with reduced ejection fraction) (HCC) 10/13/2022   Degenerative joint disease (DJD) of lumbar spine 10/12/2022   Frequent falls 05/15/2022   Unsteady gait 04/16/2022   Acute delirium 04/13/2022   Electrolyte abnormality 04/13/2022   Nausea & vomiting 04/13/2022   PAD (peripheral artery disease) (HCC) 09/03/2021   Localized, primary osteoarthritis 11/24/2020   Wound healing, delayed 09/08/2020   History of lumbar surgery 09/08/2020   Chronic ischemic heart disease 07/13/2020   History  of MI (myocardial infarction) 07/13/2020   History of PTCA 1 07/13/2020   Other personal history presenting hazards to health 07/13/2020   Pure hypercholesterolemia 07/13/2020   Elevated lactic acid level 04/19/2020   Generalized weakness 04/09/2020   Fall at home 04/09/2020   Gastroenteritis 04/09/2020   AMS (altered mental status) 04/08/2020   Hypoxia 02/01/2020   Atrial fibrillation status post cardioversion (HCC) 12/22/2019   Congestive heart failure (CHF) (HCC) 12/13/2019   Hyponatremia 12/06/2019   Acute postoperative anemia due to expected blood loss 05/28/2019   Hyperlipidemia  05/28/2019   Ischemic cardiomyopathy 05/28/2019   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   Unstable angina (HCC) 05/24/2019   Chest pain 05/21/2019   Ischemic chest pain (HCC) 05/16/2019   CAD (coronary artery disease) 05/16/2019   HTN (hypertension) 05/16/2019   Depression 05/16/2019   GERD (gastroesophageal reflux disease) 05/16/2019   Verruca plantaris 01/23/2018   Obstructive sleep apnea syndrome 01/20/2018   Major depressive disorder, single episode, moderate (HCC) 07/05/2016   Mixed sensory-motor polyneuropathy 05/17/2016   Controlled type 2 diabetes mellitus without complication, without long-term current use of insulin  (HCC) 12/19/2014   Renal cyst, left 11/11/2013   AAA (abdominal aortic aneurysm) (HCC) 10/01/2012   Osteoarthritis of knee 01/29/2012   Obesity 08/15/2011    PCP: Dr. Eliverto, MD  REFERRING PROVIDER: Dr. Eliverto, MD/ [name], [facility]  REFERRING DIAG:  R26.9 (ICD-10-CM) - Unspecified abnormalities of gait and mobility  R26.89 (ICD-10-CM) - Other abnormalities of gait and mobility  R29.6 (ICD-10-CM) - Repeated falls    THERAPY DIAG:  Muscle weakness (generalized)  Balance problem  Rationale for Evaluation and Treatment: Rehabilitation  ONSET DATE: at least 2 years  SUBJECTIVE: (From initial evaluation note)  SUBJECTIVE STATEMENT: Pt reports he is having difficulty with gait and balance.  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   He feels like his balance and walking difficulty have worsened recently.  He reports he started falling frequently, he was catching his foot on things and tripping.  The most recent major fall was about 6 months ago.  He is being careful since then and trying to walk more to get stronger and trying to be more aware of his gait pattern.  He was walking with a walker until ~4 months ago, then started using a cane, and  then has tried walking without any assistance.      He feels like his difficulty walking limits his ability to get out of the house as much as he used to because he is fearful of falling.  PLOF- he enjoys getting out of the house for social activities, going out to breakfast too; going grocery shopping a few times/week to get his groceries; prior to his back surgery he walked at the park in [place], hasn't been back though because he is worried about falling though.  He has a home aide that comes a few days a week to assist with household tasks as needed.  Also reports he had a MI a little over a week ago and was in the hospital (8/3-10/16/22)- he states he is referred to begin cardiac rehab as part of his outpatient recovery.  PERTINENT HISTORY: Extensive cardiac history History of lumbar spine surgery- 2022 L4-5 microdiscectomy/laminectomy   PAIN:  Are you having pain? R groin muscle area where he had the cardiac catheterization procedure last week.  PRECAUTIONS: Fall  RED FLAGS: Pt with recent MI (10/13/22) with stent placement (see chart review)  WEIGHT BEARING RESTRICTIONS: No  FALLS:  Has patient fallen in last 6 months? Yes. Number of falls multiple  LIVING ENVIRONMENT: Lives with: lives alone (has a self reported lady friend for social support and enjoys being able to get out the house for community activities, going to restaurants together) Lives in: House/apartment Stairs:  he lives in a single level home with bonus room over the garage, but he does not need to go upstairs.1 step to enter in the garage- able to get in without falling. Has following equipment at home: Single point cane, Walker - 4 wheeled, and Wheelchair (manual) Has a home health aide that comes 2-3x/week if he needs assistance  OCCUPATION: retired  PLOF: Independent  PATIENT GOALS: to address drop foot and be able to manage it better; to strengthen his legs; and to increase his breathing; and improve his  balance   NEXT MD VISIT: yes, next week   OBJECTIVE:   DIAGNOSTIC FINDINGS: no recent lower extremity/spine imaging  PATIENT SURVEYS:  FOTO 39/52  COGNITION: Overall cognitive status: Within functional limits for tasks assessed     SENSATION: Pt reports reduced sensation to light touch L L4-5 dermatomes  EDEMA:  No pitting edema noted on dorsum of foot   POSTURE: pt stands with wide base of support for balance; uses UE support on LE's for sit to stand transfer  PALPATION: (-) TTP distal LE b/l  LOWER EXTREMITY ROM:  Active ROM Right eval Left eval  Hip flexion WNL WNL  Hip extension    Hip abduction    Hip adduction    Hip internal rotation    Hip external rotation    Knee flexion 105 105  Knee extension -5 -5  Ankle dorsiflexion 5 deg PROM 0 deg  Ankle plantarflexion    Great toe extension 45 deg PROM 40 deg   Ankle eversion     (Blank rows = not tested)  LOWER EXTREMITY MMT:  MMT Right eval Left eval  Hip flexion 4 4  Hip extension    Hip abduction    Hip adduction    Hip internal rotation    Hip external rotation    Knee flexion 5 5  Knee extension 4 4  Ankle dorsiflexion 4/5 1/5  Ankle plantarflexion 3/5 3/5  Great toe extension 4/5 2/5  Ankle eversion 4/5 3/5   (Blank rows = not tested)   FUNCTIONAL TESTS:  OUTCOME MEASURES: TEST Outcome Interpretation  5 times sit<>stand 21 seconds >60 yo, >15 sec indicates increased risk for falls  10 meter walk test deferred <1.0 m/s indicates increased risk for falls; limited community ambulator  [employer] Assessment /56   deferred <36/56 (100% risk for falls), 37-45 (80% risk for falls); 46-51 (>50% risk for falls); 52-55 (lower risk <25% of falls)      GAIT: Distance walked: from waiting area into PT gym 50 ft, PT supervision Assistive device utilized: None Level of assistance: Complete Independence Comments: Pt ambulates with decreased gait speed, wide BOS, decreased stride length, excess L  hip flexion strategy to compensate for L foot drop  Pt not able to stand SLS on L without UE support today; able to stand R LE x 5 seconds  TODAY'S TREATMENT:  DATE: 03/20/23 Subjective:  Pt reports feeling pretty good this morning upon arrival, he expresses having some difficulty with his memory, he decided he does not like the new shoes he bought (altra style) for his hammer toes because they do not feel comfortable when he walks in them  Pain: 0/10  Objective:  Therapeutic Exercises:  While wearing L AFO, no SPC during session, gait belt and PT supervision during standing exercises  Nustep x 10 min, level 3-4 for knee mobility/LE strengthening at start of session, PT obtained hx/subjective portion of session today during this too  Knee flexion stretch with foot on step: R and L 5x ea, x15-20 seconds ea  Front step ups: L LE x10, R LE x10  Split squat: in parallel bars, L foot in front 3 sets to fatigue (12-15 reps), R foot in front 3 sets to fatigue (6-7 reps)  Squats: holding 5 lb medicine ball 3x8  Airex: feet together look up/down and R/L 1 min ea  Airex: feet together and PT manual perturbations from various directions at trunk/pelvis  Dynamic balance activity for multidirectional stepping, motor planning/sequencing, short term memory recall, PT verbal/visual/demonstration cues a SBA with gait belt needed for sequencing- rumba box (forward, L, reverse, R), side stepping R/L x2 ea, grape vine R/L, feet together weight shifts- practiced 8x today at 2x during session   Not today: Airex: Ball toss at trampoline, standing on airex (5 lb) 3x10 with PT SBA- to practice reaction to external stimuli/balance training  Hurdles: tall/short alternating, forward direction, 3 laps, pt with intermittent hand hold L with PT as he became more fatigued   PATIENT  EDUCATION:  Education details: PT POC/goals, importance of attending cardiac rehab, activity pacing Person educated: Patient Education method: Explanation Education comprehension: verbalized understanding and needs further education  HOME EXERCISE PROGRAM: Access Code: [email or web address] URL: [email or web address] Date: 10/24/2022 Prepared by: Vernell Reges  Exercises - Seated Heel Raise  - 1 x daily - 3 x weekly - 2 sets - 20 reps - Seated March  - 1 x daily - 3 x weekly - 2 sets - 10 reps - Seated Long Arc Quad  - 1 x daily - 3 x weekly - 2 sets - 10 reps  ASSESSMENT:  CLINICAL IMPRESSION: Squats and front step ups were challenging LE strengthening exercises for pt today; challenged to fatigue.  Pt remained steady on airex uneven surface today during manual perturbations simulating if he got bumped by another person while amb out in public.  Had difficulty with multi sequenced dynamic balance activity, required verbal/visual/demonstration cues to sequence the stepping patterns and PT SBA for safety as he was a bit unsteady with grapevine stepping.  Likely has not met maximum recovery with skilled PT.  I did discuss a goal for transitioning to a regular strengthening program at the gym to continue exercising long term when he completes this course of PT.  Anticipate transition to HEP/DC within next few weeks.   OBJECTIVE IMPAIRMENTS: Abnormal gait, cardiopulmonary status limiting activity, decreased activity tolerance, decreased balance, decreased mobility, difficulty walking, decreased ROM, and decreased strength.   ACTIVITY LIMITATIONS: standing, squatting, stairs, transfers, and locomotion level  PARTICIPATION LIMITATIONS: meal prep, cleaning, laundry, interpersonal relationship, shopping, and community activity  PERSONAL FACTORS: Age, Past/current experiences, Time since onset of injury/illness/exacerbation, and cardiovascular hx including recent MI with stent placement last  week  are also affecting patient's functional outcome.   REHAB POTENTIAL: Good  CLINICAL DECISION MAKING: Stable/uncomplicated  EVALUATION COMPLEXITY: Low  GOALS: Goals reviewed with patient? Yes  SHORT TERM GOALS: Target date: 11/11/22 Pt will be able to perform a HEP for LE strengthening >3x/week Baseline: 11/27/22 pt has been instructed on a HEP; 12/24/22: met Goal status: Met   LONG TERM GOALS: Target date: 04/08/23  Improve FOTO to >51 indicating pt able to perform his daily activities without being limited by his balance; 12/24/22: in progress Baseline: 39; 10/7: 54; 01/21/23 51; 02/18/23: in progress; 03/18/23: 51 Goal status: In Progress  2.  Pt will be able to amb with typical width BOS and b/l heel strike gait pattern using least restrictive AD and AFO on flat/incline/decline/grass surfaces x 10 min Baseline: not using any AD or AFO in clinic, pt wide BOS, no L heel strike due to foot drop; 11/27/22 pt is amb with SPC now, discussions continue regarding AFO/brace for improved safety; 12/24/22: pt now wearing AFO and using SPC on uneven surfaces with PT SBA x 5 minutes; 01/21/23: in progress- able to amb intervals of 5-6 min x 2 with 1-2 min sitting break between; 03/18/23: using SPC for >5 min on incline up driveway/neighborhood, he would like to be able to do without SPC again as this was PLOF Goal status: In progress  3.  Pt will be able to perform 5x STS in <15 sec indicating reduced risk for falling Baseline: 21 sec, 11/27/22 19 sec (with mild UE use); 12/28/22: 18 seconds  (with mild UE support) 01/21/23:  19 seconds without UE support; 02/18/23 17 seconds without UE support; 03/18/23: 19 seconds without UE support Goal status: In Progress  4. Pt will be able to amb x 1 block distance to walk his dog 1x/day.  Baseline: 1/4-1/2 block (pt self report), 01/21/23: amb 1 loop around building in 5.25 minutes at clinic; 03/18/23- did not assess due to inclement weather  Goal status: In  progress  5. Improve 10 M walk test to >1.0 m/s to promote reduced fall risk and improved community ambulator status  Baseline: 12/24/22 .9 m/s; 01/21/23: .8 m/s (at end of session, was fatigued)  Goal status: In progress   6. Pt will independently be able to transfer stand to floor and floor to standing with use of chair/sturdy surface to simulate being able to get up from floor and to reduce fear/risk of falling  Baseline: pt has not tried and expresses fear of not being able to get up from floor living alone; 03/18/23: see above objective section of note; in progress  Goal status: In progress 7. Pt will be able to ascend 1 flight of stairs in his home 2x/day to be able to access second floor of his home independently as he lives at home alone  Baseline: 03/18/23 pt is not doing, fear of falling, and notes feeling weak   Goal status: new   8. Pt will transition to independent exercise program 2-3x/week for strengthening at his local fitness center after completing this course of PT    Baseline: 03/18/23 pt states he will reinstate his membership this week   Goal status: new   PLAN:  PT FREQUENCY: 1-2x/week  PT DURATION: 3 weeks  PLANNED INTERVENTIONS: Therapeutic exercises, Therapeutic activity, Neuromuscular re-education, Balance training, Gait training, Patient/Family education, Self Care, Joint mobilization, and Orthotic/Fit training  PLAN FOR NEXT SESSION: continue with proximal hip and LE strengthening, multidirectional movements/dynamic balance activities, improving confidence/reducing fear of falling through activities in small spaces and that require increased reaction speed to unpredictable external stimuli; fall recovery/floor transfers training;  updated PT and pt goals today; plan to transition to independent strengthening within the next month  Vernell Reges, PT, DPT, OCS  Physical Therapist - [place]  [facility]   Vernell FORBES Reges, [place] 02/13/23  11:59 AM

## 2023-03-21 ENCOUNTER — Encounter: Payer: Medicare HMO | Admitting: *Deleted

## 2023-03-21 ENCOUNTER — Ambulatory Visit: Payer: Medicare HMO | Admitting: Cardiovascular Disease

## 2023-03-21 DIAGNOSIS — I214 Non-ST elevation (NSTEMI) myocardial infarction: Secondary | ICD-10-CM

## 2023-03-21 DIAGNOSIS — Z48812 Encounter for surgical aftercare following surgery on the circulatory system: Secondary | ICD-10-CM | POA: Diagnosis not present

## 2023-03-21 DIAGNOSIS — Z955 Presence of coronary angioplasty implant and graft: Secondary | ICD-10-CM

## 2023-03-21 LAB — GLUCOSE, CAPILLARY
Glucose-Capillary: 54 mg/dL — ABNORMAL LOW (ref 70–99)
Glucose-Capillary: 74 mg/dL (ref 70–99)

## 2023-03-21 NOTE — Progress Notes (Signed)
 Daily Session Note  Patient Details  Name: Lorik Guo [facility]. MRN: [id number] Date of Birth: 1945/08/01 Referring Provider:   Flowsheet Row Cardiac Rehab from 11/19/2022 in [facility] Cardiac [facility]  Referring Provider Dr. Denyse Bathe MD       Encounter Date: 03/21/2023  Check In:  Session Check In - 03/21/23 1542       Check-In   Supervising physician immediately available to respond to emergencies See telemetry face sheet for immediately available ER MD    Location [facility]-Cardiac & Pulmonary Rehab    Staff Present Hoy Rodney, RN BSN;Joseph Rolinda, NORWOOD HARMAN Arzella Claudene, RN, [place]    Virtual Visit No    Medication changes reported     No    Fall or balance concerns reported    No    Warm-up and Cool-down Performed on first and last piece of equipment    Resistance Training Performed Yes    VAD Patient? No    PAD/SET Patient? No      Pain Assessment   Currently in Pain? No/denies                Social History   Tobacco Use  Smoking Status Former  Smokeless Tobacco Never  Tobacco Comments   Quit over 40 years ago    Goals Met:  Independence with exercise equipment Exercise tolerated well No report of concerns or symptoms today Strength training completed today  Goals Unmet:  Not Applicable  Comments: Pt able to follow exercise prescription today without complaint.  Will continue to monitor for progression.    Dr. Oneil Pinal is Medical Director for [facility] Cardiac Rehabilitation.  Dr. Fuad Aleskerov is Medical Director for [facility] Pulmonary Rehabilitation.

## 2023-03-25 ENCOUNTER — Ambulatory Visit: Payer: Medicare HMO

## 2023-03-25 DIAGNOSIS — M6281 Muscle weakness (generalized): Secondary | ICD-10-CM | POA: Diagnosis not present

## 2023-03-25 DIAGNOSIS — R2689 Other abnormalities of gait and mobility: Secondary | ICD-10-CM

## 2023-03-25 NOTE — Therapy (Signed)
 OUTPATIENT PHYSICAL THERAPY LOWER EXTREMITY TREATMENT/PROGRESS NOTE/ Re-certification through 04/08/23    Patient Name: Jermani Eberlein [facility]. MRN: [id number] DOB:10-27-1945, 78 y.o., male Today's Date: 02/13/23  END OF SESSION:  PT End of Session - 03/25/23 1335     Visit Number 35    Number of Visits 42    Date for PT Re-Evaluation 04/08/23    Authorization Type re-cert/PN done today x 3 weeks (6 additional visits)- planning to transition to HEP 04/08/23    PT Start Time 1330    PT Stop Time 1415    PT Time Calculation (min) 45 min    Equipment Utilized During Treatment Gait belt    Activity Tolerance Patient tolerated treatment well    Behavior During Therapy WFL for tasks assessed/performed                  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   Hypertension    Ischemic cardiomyopathy    MI, old    Peripheral neuropathy    PFO (patent foramen ovale) 05/2019   s/p repair   Sleep apnea    Spinal stenosis of lumbar region    TIA (transient ischemic attack)    Past Surgical History:  Procedure Laterality Date   APPENDECTOMY     BREAST SURGERY Right    benign mass   CARDIOVERSION Right 12/15/2019   Procedure: CARDIOVERSION;  Surgeon: Fernand Denyse LABOR, MD;  Location: [facility] ORS;  Service: Cardiovascular;  Laterality: Right;   CATARACT EXTRACTION, BILATERAL     COLONOSCOPY     CORONARY ANGIOPLASTY WITH STENT PLACEMENT     CORONARY ARTERY BYPASS GRAFT  05/2019   LIMA-LAD   CORONARY STENT INTERVENTION N/A 10/15/2022   Procedure: CORONARY STENT INTERVENTION;  Surgeon: Ammon Blunt, MD;  Location: [facility]  INVASIVE CV LAB;  Service: Cardiovascular;  Laterality: N/A;   LEFT HEART CATH N/A 10/15/2022   Procedure: Left Heart Cath;  Surgeon: Ammon Blunt, MD;  Location: [facility] INVASIVE CV LAB;  Service: Cardiovascular;  Laterality: N/A;   LEFT HEART CATH AND CORS/GRAFTS ANGIOGRAPHY N/A 05/21/2019   Procedure: LEFT HEART CATH AND CORONARY ANGIOGRAPHY;  Surgeon: Fernand Denyse LABOR, MD;  Location: [facility] INVASIVE CV LAB;  Service: Cardiovascular;  Laterality: N/A;   LUMBAR LAMINECTOMY/DECOMPRESSION MICRODISCECTOMY N/A 07/04/2020   Procedure: L4-5 LAMINECTOMY;  Surgeon: Bluford Standing, MD;  Location: [facility] ORS;  Service: Neurosurgery;  Laterality: N/A;   REPLACEMENT TOTAL KNEE BILATERAL     Patient Active Problem List   Diagnosis Date Noted   NSTEMI (non-ST elevated myocardial infarction) (HCC) 10/13/2022   HFrEF (heart failure with reduced ejection fraction) (HCC) 10/13/2022   Degenerative joint disease (DJD) of lumbar spine 10/12/2022   Frequent falls 05/15/2022   Unsteady gait 04/16/2022   Acute delirium 04/13/2022   Electrolyte abnormality 04/13/2022   Nausea & vomiting 04/13/2022   PAD (peripheral artery disease) (HCC) 09/03/2021   Localized, primary osteoarthritis 11/24/2020   Wound healing, delayed 09/08/2020   History of lumbar surgery 09/08/2020   Chronic ischemic heart disease 07/13/2020   History  of MI (myocardial infarction) 07/13/2020   History of PTCA 1 07/13/2020   Other personal history presenting hazards to health 07/13/2020   Pure hypercholesterolemia 07/13/2020   Elevated lactic acid level 04/19/2020   Generalized weakness 04/09/2020   Fall at home 04/09/2020   Gastroenteritis 04/09/2020   AMS (altered mental status) 04/08/2020   Hypoxia 02/01/2020   Atrial fibrillation status post cardioversion (HCC) 12/22/2019   Congestive heart failure (CHF) (HCC) 12/13/2019   Hyponatremia 12/06/2019   Acute postoperative anemia due to expected blood loss 05/28/2019   Hyperlipidemia  05/28/2019   Ischemic cardiomyopathy 05/28/2019   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   Unstable angina (HCC) 05/24/2019   Chest pain 05/21/2019   Ischemic chest pain (HCC) 05/16/2019   CAD (coronary artery disease) 05/16/2019   HTN (hypertension) 05/16/2019   Depression 05/16/2019   GERD (gastroesophageal reflux disease) 05/16/2019   Verruca plantaris 01/23/2018   Obstructive sleep apnea syndrome 01/20/2018   Major depressive disorder, single episode, moderate (HCC) 07/05/2016   Mixed sensory-motor polyneuropathy 05/17/2016   Controlled type 2 diabetes mellitus without complication, without long-term current use of insulin  (HCC) 12/19/2014   Renal cyst, left 11/11/2013   AAA (abdominal aortic aneurysm) (HCC) 10/01/2012   Osteoarthritis of knee 01/29/2012   Obesity 08/15/2011    PCP: Dr. Eliverto, MD  REFERRING PROVIDER: Dr. Eliverto, MD/ [name], [facility]  REFERRING DIAG:  R26.9 (ICD-10-CM) - Unspecified abnormalities of gait and mobility  R26.89 (ICD-10-CM) - Other abnormalities of gait and mobility  R29.6 (ICD-10-CM) - Repeated falls    THERAPY DIAG:  Muscle weakness (generalized)  Balance problem  Rationale for Evaluation and Treatment: Rehabilitation  ONSET DATE: at least 2 years  SUBJECTIVE: (From initial evaluation note)  SUBJECTIVE STATEMENT: Pt reports he is having difficulty with gait and balance.  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   He feels like his balance and walking difficulty have worsened recently.  He reports he started falling frequently, he was catching his foot on things and tripping.  The most recent major fall was about 6 months ago.  He is being careful since then and trying to walk more to get stronger and trying to be more aware of his gait pattern.  He was walking with a walker until ~4 months ago, then started using a cane, and  then has tried walking without any assistance.      He feels like his difficulty walking limits his ability to get out of the house as much as he used to because he is fearful of falling.  PLOF- he enjoys getting out of the house for social activities, going out to breakfast too; going grocery shopping a few times/week to get his groceries; prior to his back surgery he walked at the park in [place], hasn't been back though because he is worried about falling though.  He has a home aide that comes a few days a week to assist with household tasks as needed.  Also reports he had a MI a little over a week ago and was in the hospital (8/3-10/16/22)- he states he is referred to begin cardiac rehab as part of his outpatient recovery.  PERTINENT HISTORY: Extensive cardiac history History of lumbar spine surgery- 2022 L4-5 microdiscectomy/laminectomy   PAIN:  Are you having pain? R groin muscle area where he had the cardiac catheterization procedure last week.  PRECAUTIONS: Fall  RED FLAGS: Pt with recent MI (10/13/22) with stent placement (see chart review)  WEIGHT BEARING RESTRICTIONS: No  FALLS:  Has patient fallen in last 6 months? Yes. Number of falls multiple  LIVING ENVIRONMENT: Lives with: lives alone (has a self reported lady friend for social support and enjoys being able to get out the house for community activities, going to restaurants together) Lives in: House/apartment Stairs:  he lives in a single level home with bonus room over the garage, but he does not need to go upstairs.1 step to enter in the garage- able to get in without falling. Has following equipment at home: Single point cane, Walker - 4 wheeled, and Wheelchair (manual) Has a home health aide that comes 2-3x/week if he needs assistance  OCCUPATION: retired  PLOF: Independent  PATIENT GOALS: to address drop foot and be able to manage it better; to strengthen his legs; and to increase his breathing; and improve his  balance   NEXT MD VISIT: yes, next week   OBJECTIVE:   DIAGNOSTIC FINDINGS: no recent lower extremity/spine imaging  PATIENT SURVEYS:  FOTO 39/52  COGNITION: Overall cognitive status: Within functional limits for tasks assessed     SENSATION: Pt reports reduced sensation to light touch L L4-5 dermatomes  EDEMA:  No pitting edema noted on dorsum of foot   POSTURE: pt stands with wide base of support for balance; uses UE support on LE's for sit to stand transfer  PALPATION: (-) TTP distal LE b/l  LOWER EXTREMITY ROM:  Active ROM Right eval Left eval  Hip flexion WNL WNL  Hip extension    Hip abduction    Hip adduction    Hip internal rotation    Hip external rotation    Knee flexion 105 105  Knee extension -5 -5  Ankle dorsiflexion 5 deg PROM 0 deg  Ankle plantarflexion    Great toe extension 45 deg PROM 40 deg   Ankle eversion     (Blank rows = not tested)  LOWER EXTREMITY MMT:  MMT Right eval Left eval  Hip flexion 4 4  Hip extension    Hip abduction    Hip adduction    Hip internal rotation    Hip external rotation    Knee flexion 5 5  Knee extension 4 4  Ankle dorsiflexion 4/5 1/5  Ankle plantarflexion 3/5 3/5  Great toe extension 4/5 2/5  Ankle eversion 4/5 3/5   (Blank rows = not tested)   FUNCTIONAL TESTS:  OUTCOME MEASURES: TEST Outcome Interpretation  5 times sit<>stand 21 seconds >60 yo, >15 sec indicates increased risk for falls  10 meter walk test deferred <1.0 m/s indicates increased risk for falls; limited community ambulator  [employer] Assessment /56   deferred <36/56 (100% risk for falls), 37-45 (80% risk for falls); 46-51 (>50% risk for falls); 52-55 (lower risk <25% of falls)      GAIT: Distance walked: from waiting area into PT gym 50 ft, PT supervision Assistive device utilized: None Level of assistance: Complete Independence Comments: Pt ambulates with decreased gait speed, wide BOS, decreased stride length, excess L  hip flexion strategy to compensate for L foot drop  Pt not able to stand SLS on L without UE support today; able to stand R LE x 5 seconds  TODAY'S TREATMENT:  DATE: 03/25/23 Subjective:  Pt reports his L foot hammer toe is bothering him; he decided to return the altra shoes because they did not feel comfortable.  He feels his toe pain is affecting his walking/balance because it's uncomfortable putting weight on his L 4th/5th toes.  He has a PCP appointment tomorrow and plans to mention it to him too.  No falls this weekend, he mostly stayed home because of the snow/bad weather this week.    Pain: 0/10  Objective: Pt removed shoe, AFO, and sock from L foot- PT skin/toe inspection- in non weight bearing and weight bearing pt's L 5th toe curls under 4th, 5th toenail presses on plantar pad of 4th distal phalanx; pt has it cushioned with band aid; skin is in tact but red/irritated.  Discussed strategies to use toe spacer, moleskin or toe cushion to promote improved alignment and to eliminate toe nail compressing on soft tissue; also recommended discussing with PCP at appointment tomorrow.  New band aid applied.  Pt then donned sock/AFO and shoe again for remainder of session; required PT assistance to tie shoe.  Therapeutic Exercises:  While wearing L AFO, no SPC during session, gait belt and PT supervision during standing exercises  Nustep x 10 min, level 3-4 for knee mobility/LE strengthening at start of session, PT obtained hx/subjective portion of session today during this too- not today  Knee flexion stretch with foot on step: R and L 5x ea, x15-20 seconds ea  Front step ups: L LE x10, R LE x10  Split squat: in parallel bars, L foot in front 3 sets to fatigue (12-15 reps), R foot in front 3 sets to fatigue (6-7 reps)- not today  Squats: holding 5 lb medicine ball  3x8  Hurdles: tall/short alternating, forward direction in a circular CW and CCW direction, 3 laps, PT SBA and occasionally CGA for tall hurdles  Forward walking, reverse walking, side stepping, turning 180 degrees with gait direction change on flat surface in hallway- 4 laps, good balance with this today  Not today: Airex: Ball toss at trampoline, standing on airex (5 lb) 3x10 with PT SBA- to practice reaction to external stimuli/balance training Airex: feet together look up/down and R/L 1 min ea- not today  Airex: feet together and PT manual perturbations from various directions at trunk/pelvis- not today  Dynamic balance activity for multidirectional stepping, motor planning/sequencing, short term memory recall, PT verbal/visual/demonstration cues a SBA with gait belt needed for sequencing- rumba box (forward, L, reverse, R), side stepping R/L x2 ea, grape vine R/L, feet together weight shifts- practiced 8x today at 2x during session    PATIENT EDUCATION:  Education details: PT POC/goals, importance of attending cardiac rehab, activity pacing Person educated: Patient Education method: Explanation Education comprehension: verbalized understanding and needs further education  HOME EXERCISE PROGRAM: Access Code: [email or web address] URL: [email or web address] Date: 10/24/2022 Prepared by: Vernell Reges  Exercises - Seated Heel Raise  - 1 x daily - 3 x weekly - 2 sets - 20 reps - Seated March  - 1 x daily - 3 x weekly - 2 sets - 10 reps - Seated Long Arc Quad  - 1 x daily - 3 x weekly - 2 sets - 10 reps  ASSESSMENT:  CLINICAL IMPRESSION: Inspected pt's L 4th and 5th toe today as his toe structure results in 5th toe toenail compressing plantar surface of 5th digit.  Skin is in tact but redness noted from the compression.  Discussed strategies to reduce the compression and  promote improved toe alignment/spacing.  Also recommended he discuss with his PCP tomorrow.  Addressing this  should reduce his foot pain and improve his standing and walking tolerance and gait mechanics.  Tolerated LE strengthening and balance exercises well.  He demonstrated good balance during ambulation in various directions at end of session today.  Likely has not met maximum recovery with skilled PT.  I did discuss a goal for transitioning to a regular strengthening program at the gym to continue exercising long term when he completes this course of PT.  Anticipate transition to HEP/DC within next few weeks.   OBJECTIVE IMPAIRMENTS: Abnormal gait, cardiopulmonary status limiting activity, decreased activity tolerance, decreased balance, decreased mobility, difficulty walking, decreased ROM, and decreased strength.   ACTIVITY LIMITATIONS: standing, squatting, stairs, transfers, and locomotion level  PARTICIPATION LIMITATIONS: meal prep, cleaning, laundry, interpersonal relationship, shopping, and community activity  PERSONAL FACTORS: Age, Past/current experiences, Time since onset of injury/illness/exacerbation, and cardiovascular hx including recent MI with stent placement last week  are also affecting patient's functional outcome.   REHAB POTENTIAL: Good  CLINICAL DECISION MAKING: Stable/uncomplicated  EVALUATION COMPLEXITY: Low   GOALS: Goals reviewed with patient? Yes  SHORT TERM GOALS: Target date: 11/11/22 Pt will be able to perform a HEP for LE strengthening >3x/week Baseline: 11/27/22 pt has been instructed on a HEP; 12/24/22: met Goal status: Met   LONG TERM GOALS: Target date: 04/08/23  Improve FOTO to >51 indicating pt able to perform his daily activities without being limited by his balance; 12/24/22: in progress Baseline: 39; 10/7: 54; 01/21/23 51; 02/18/23: in progress; 03/18/23: 51 Goal status: In Progress  2.  Pt will be able to amb with typical width BOS and b/l heel strike gait pattern using least restrictive AD and AFO on flat/incline/decline/grass surfaces x 10  min Baseline: not using any AD or AFO in clinic, pt wide BOS, no L heel strike due to foot drop; 11/27/22 pt is amb with SPC now, discussions continue regarding AFO/brace for improved safety; 12/24/22: pt now wearing AFO and using SPC on uneven surfaces with PT SBA x 5 minutes; 01/21/23: in progress- able to amb intervals of 5-6 min x 2 with 1-2 min sitting break between; 03/18/23: using SPC for >5 min on incline up driveway/neighborhood, he would like to be able to do without SPC again as this was PLOF Goal status: In progress  3.  Pt will be able to perform 5x STS in <15 sec indicating reduced risk for falling Baseline: 21 sec, 11/27/22 19 sec (with mild UE use); 12/28/22: 18 seconds  (with mild UE support) 01/21/23:  19 seconds without UE support; 02/18/23 17 seconds without UE support; 03/18/23: 19 seconds without UE support Goal status: In Progress  4. Pt will be able to amb x 1 block distance to walk his dog 1x/day.  Baseline: 1/4-1/2 block (pt self report), 01/21/23: amb 1 loop around building in 5.25 minutes at clinic; 03/18/23- did not assess due to inclement weather  Goal status: In progress  5. Improve 10 M walk test to >1.0 m/s to promote reduced fall risk and improved community ambulator status  Baseline: 12/24/22 .9 m/s; 01/21/23: .8 m/s (at end of session, was fatigued)  Goal status: In progress   6. Pt will independently be able to transfer stand to floor and floor to standing with use of chair/sturdy surface to simulate being able to get up from floor and to reduce fear/risk of falling  Baseline: pt has not tried and  expresses fear of not being able to get up from floor living alone; 03/18/23: see above objective section of note; in progress  Goal status: In progress 7. Pt will be able to ascend 1 flight of stairs in his home 2x/day to be able to access second floor of his home independently as he lives at home alone  Baseline: 03/18/23 pt is not doing, fear of falling, and notes feeling  weak   Goal status: new   8. Pt will transition to independent exercise program 2-3x/week for strengthening at his local fitness center after completing this course of PT    Baseline: 03/18/23 pt states he will reinstate his membership this week   Goal status: new   PLAN:  PT FREQUENCY: 1-2x/week  PT DURATION: 3 weeks  PLANNED INTERVENTIONS: Therapeutic exercises, Therapeutic activity, Neuromuscular re-education, Balance training, Gait training, Patient/Family education, Self Care, Joint mobilization, and Orthotic/Fit training  PLAN FOR NEXT SESSION: continue with proximal hip and LE strengthening, multidirectional movements/dynamic balance activities, improving confidence/reducing fear of falling through activities in small spaces and that require increased reaction speed to unpredictable external stimuli; fall recovery/floor transfers training; updated PT and pt goals today; plan to transition to independent strengthening within the next month  Vernell Reges, PT, DPT, OCS  Physical Therapist - [facility]   Vernell FORBES Reges, [place] 02/13/23 1:36 PM

## 2023-03-27 ENCOUNTER — Ambulatory Visit: Payer: Medicare HMO

## 2023-03-27 DIAGNOSIS — M6281 Muscle weakness (generalized): Secondary | ICD-10-CM | POA: Diagnosis not present

## 2023-03-27 DIAGNOSIS — Z955 Presence of coronary angioplasty implant and graft: Secondary | ICD-10-CM

## 2023-03-27 DIAGNOSIS — R2689 Other abnormalities of gait and mobility: Secondary | ICD-10-CM

## 2023-03-27 DIAGNOSIS — I214 Non-ST elevation (NSTEMI) myocardial infarction: Secondary | ICD-10-CM

## 2023-03-27 NOTE — Progress Notes (Signed)
 Cardiac Individual Treatment Plan  Patient Details  Name: Devin [name] [facility]. MRN: [id number] Date of Birth: [birth year] Referring Provider:   Flowsheet Row Cardiac Rehab from 11/19/2022 in [facility] Cardiac and Pulmonary Rehab  Referring Provider Dr. Debborah Fairly MD       Initial Encounter Date:  Flowsheet Row Cardiac Rehab from 11/19/2022 in [facility] Cardiac and Pulmonary Rehab  Date 11/19/22       Visit Diagnosis: NSTEMI (non-ST elevation myocardial infarction) [facility])  Status post coronary artery stent placement  Patient's Home Medications on Admission:  Current Outpatient Medications:    albuterol  (VENTOLIN  HFA) 108 (90 Base) MCG/ACT inhaler, Inhale 2 puffs into the lungs every 6 (six) hours as needed for wheezing or shortness of breath., Disp: , Rfl:    amiodarone  (PACERONE ) 200 MG tablet, Take 200 mg by mouth daily., Disp: , Rfl:    amLODipine  (NORVASC ) 10 MG tablet, Take 1 tablet by mouth daily., Disp: , Rfl:    apixaban  (ELIQUIS ) 5 MG TABS tablet, Take 1 tablet (5 mg total) by mouth 2 (two) times daily., Disp: 60 tablet, Rfl: 3   atorvastatin  (LIPITOR ) 80 MG tablet, Take 80 mg by mouth daily., Disp: , Rfl:    buPROPion  (WELLBUTRIN  XL) 300 MG 24 hr tablet, Take 300 mg by mouth daily., Disp: , Rfl:    cephALEXin (KEFLEX) 500 MG capsule, Take 500 mg by mouth 3 (three) times daily., Disp: , Rfl:    clopidogrel  (PLAVIX ) 75 MG tablet, Take 1 tablet (75 mg total) by mouth daily with breakfast., Disp: 30 tablet, Rfl: 11   docusate (COLACE) 60 MG/15ML syrup, Take 60 mg by mouth daily., Disp: , Rfl:    EPINEPHrine  0.3 mg/0.3 mL IJ SOAJ injection, Inject 0.3 mg into the muscle as needed for anaphylaxis., Disp: , Rfl:    ezetimibe  (ZETIA ) 10 MG tablet, Take 10 mg by mouth daily., Disp: , Rfl:    fluticasone  (FLONASE ) 50 MCG/ACT nasal spray, SPRAY 2 SPRAYS INTO EACH NOSTRIL EVERY DAY, Disp: , Rfl:    gabapentin  (NEURONTIN ) 300 MG capsule, Take 300 mg by mouth 2 (two) times daily., Disp: , Rfl:     loratadine  (CLARITIN ) 10 MG tablet, Take 10 mg by mouth daily., Disp: , Rfl:    metFORMIN  (GLUCOPHAGE ) 500 MG tablet, Take 1,000 mg by mouth 2 (two) times daily., Disp: , Rfl:    metoprolol  succinate (TOPROL  XL) 50 MG 24 hr tablet, Take 1 tablet (50 mg total) by mouth daily. Take with or immediately following a meal., Disp: 30 tablet, Rfl: 11   nitroGLYCERIN  (NITROSTAT ) 0.4 MG SL tablet, Place 0.4 mg under the tongue every 5 (five) minutes as needed for chest pain., Disp: , Rfl:    pantoprazole  (PROTONIX ) 40 MG tablet, TAKE 1 TABLET BY MOUTH EVERY DAY, Disp: 30 tablet, Rfl: 1   sacubitril -valsartan  (ENTRESTO ) 97-103 MG, TAKE 1 TABLET BY MOUTH TWICE A DAY, Disp: 180 tablet, Rfl: 1  Past Medical History: 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   Hypertension    Ischemic cardiomyopathy    MI,  old    Peripheral neuropathy    PFO (patent foramen ovale) 05/2019   s/p repair   Sleep apnea    Spinal stenosis of lumbar region    TIA (transient ischemic attack)     Tobacco Use: Social History   Tobacco Use  Smoking Status Former  Smokeless Tobacco Never  Tobacco Comments   Quit over 40 years ago    Labs: Review Flowsheet  More data exists      Latest Ref Rng & Units 04/08/2020 07/04/2020 04/12/2022 04/13/2022 10/13/2022  Labs for ITP Cardiac and Pulmonary Rehab  Cholestrol 0 - 200 mg/dL - - - - 657   LDL (calc) 0 - 99 mg/dL - - - - 35   HDL-C >84 mg/dL - - - - 59   Trlycerides <150 mg/dL - - - - 55   Hemoglobin A1c 4.8 - 5.6 % 5.8  - - 5.5  6.3   Bicarbonate 20.0 - 28.0 mmol/L - - 23.9  - -  TCO2 22 - 32 mmol/L - 24  - - -   Acid-base deficit 0.0 - 2.0 mmol/L - - 0.1  - -  O2 Saturation % - - 90.3  - -     Exercise Target Goals: Exercise Program Goal: Individual exercise prescription set using results from initial 6 min walk test and THRR while considering  patient's activity barriers and safety.   Exercise Prescription Goal: Initial exercise prescription builds to 30-45 minutes a day of aerobic activity, 2-3 days per week.  Home exercise guidelines will be given to patient during program as part of exercise prescription that the participant will acknowledge.   Education: Aerobic Exercise: - Group verbal and visual presentation on the components of exercise prescription. Introduces F.I.T.T principle from ACSM for exercise prescriptions.  Reviews F.I.T.T. principles of aerobic exercise including progression. Written material given at graduation. Flowsheet Row Cardiac Rehab from 11/19/2022 in [facility] Cardiac and Pulmonary Rehab  Education need identified 11/19/22       Education: Resistance Exercise: - Group verbal and visual presentation on the components of exercise prescription. Introduces F.I.T.T principle from ACSM for exercise prescriptions  Reviews F.I.T.T. principles of resistance exercise including progression. Written material given at graduation.    Education: Exercise & Equipment Safety: - Individual verbal instruction and demonstration of equipment use and safety with use of the equipment. Flowsheet Row Cardiac Rehab from 11/19/2022 in [facility] Cardiac and Pulmonary Rehab  Date 11/19/22  Educator NT  Instruction Review Code 1- Verbalizes Understanding       Education: Exercise Physiology & General Exercise Guidelines: - Group verbal and written instruction with models to review the exercise physiology of the cardiovascular system and associated critical values. Provides general exercise guidelines with specific guidelines to those with heart or lung disease.  Flowsheet Row Cardiac Rehab from  10/07/2019 in [facility] Cardiac and Pulmonary Rehab  Date 09/23/19  Educator AS  Instruction Review Code 1- Verbalizes Understanding       Education: Flexibility, Balance, Mind/Body Relaxation: - Group verbal and visual presentation with interactive activity on the components of exercise prescription. Introduces F.I.T.T principle from ACSM for exercise prescriptions. Reviews F.I.T.T. principles of flexibility and balance exercise training including progression. Also discusses the mind body connection.  Reviews various relaxation techniques to help reduce and manage stress (i.e. Deep breathing, progressive muscle relaxation, and visualization). Balance handout provided to take home. Written material given at graduation.   Activity Barriers & Risk Stratification:  Activity Barriers & Cardiac Risk Stratification -  11/19/22 1442       Activity Barriers & Cardiac Risk Stratification   Activity Barriers Right Knee Replacement;Left Knee Replacement;Muscular Weakness;Balance Concerns;Other (comment)    Comments drop foot, impaired gait, neuropathy    Cardiac Risk Stratification Moderate             6 Minute Walk:  6 Minute Walk     Row Name 11/19/22 1440         6 Minute Walk   Phase Initial     Distance 620 feet     Walk Time 6 minutes     # of Rest Breaks 0     MPH 1.17     METS 1.02     RPE 11     Perceived Dyspnea  0     VO2 Peak 3.59     Symptoms No     Resting HR 56 bpm     Resting BP 128/68     Resting Oxygen Saturation  97 %     Exercise Oxygen Saturation  during 6 min walk 96 %     Max Ex. HR 68 bpm     Max Ex. BP 138/76     2 Minute Post BP 136/70              Oxygen Initial Assessment:   Oxygen Re-Evaluation:   Oxygen Discharge (Final Oxygen Re-Evaluation):   Initial Exercise Prescription:  Initial Exercise Prescription - 11/19/22 1400       Date of Initial Exercise RX and Referring Provider   Date 11/19/22    Referring Provider Dr. Debborah Fairly  MD      Oxygen   Maintain Oxygen Saturation 88% or higher      Treadmill   MPH 1    Grade 0    Minutes 15    METs 1.8      NuStep   Level 1    SPM 80    Minutes 15    METs 1.02      Arm Ergometer   Level 1    RPM 50    Minutes 15    METs 1.02      Track   Laps 16    Minutes 15    METs 1.87      Prescription Details   Frequency (times per week) 2    Duration Progress to 30 minutes of continuous aerobic without signs/symptoms of physical distress      Intensity   THRR 40-80% of Max Heartrate 90-124    Ratings of Perceived Exertion 11-13    Perceived Dyspnea 0-4      Progression   Progression Continue to progress workloads to maintain intensity without signs/symptoms of physical distress.      Resistance Training   Training Prescription Yes    Weight 4 lb    Reps 10-15             Perform Capillary Blood Glucose checks as needed.  Exercise Prescription Changes:   Exercise Prescription Changes     Row Name 11/19/22 1400 12/13/22 0800 12/26/22 0800 01/09/23 0900 01/24/23 1400     Response to Exercise   Blood Pressure (Admit) 128/68 126/58 108/56 122/60 112/58   Blood Pressure (Exercise) 138/76 152/66 142/70 144/64 --   Blood Pressure (Exit) 136/70 124/68 108/60 118/60 124/64   Heart Rate (Admit) 56 bpm 69 bpm 74 bpm 74 bpm 104 bpm   Heart Rate (Exercise) 68 bpm 104 bpm 95 bpm 91 bpm  108 bpm   Heart Rate (Exit) 60 bpm 69 bpm 68 bpm 83 bpm 95 bpm   Oxygen Saturation (Admit) 97 % -- -- -- --   Oxygen Saturation (Exercise) 96 % -- -- -- --   Rating of Perceived Exertion (Exercise) 11 13 15 13 13    Perceived Dyspnea (Exercise) 0 -- -- -- 0   Symptoms none none none none none   Comments Results First two weeks of exercise -- -- --   Duration -- Continue with 30 min of aerobic exercise without signs/symptoms of physical distress. Continue with 30 min of aerobic exercise without signs/symptoms of physical distress. Continue with 30 min of aerobic  exercise without signs/symptoms of physical distress. Continue with 30 min of aerobic exercise without signs/symptoms of physical distress.   Intensity -- THRR unchanged THRR unchanged THRR unchanged THRR unchanged     Progression   Progression -- Continue to progress workloads to maintain intensity without signs/symptoms of physical distress. Continue to progress workloads to maintain intensity without signs/symptoms of physical distress. Continue to progress workloads to maintain intensity without signs/symptoms of physical distress. Continue to progress workloads to maintain intensity without signs/symptoms of physical distress.   Average METs -- 2.29 2.09 2.53 --     Resistance Training   Training Prescription -- Yes Yes Yes Yes   Weight -- none  Pt's doctor did not give clearance for resistance training none  Pt's doctor did not give clearance for resistance training none  Pt's doctor did not give clearance for resistance training none  Pt's doctor did not give clearance for resistance training   Reps -- 10-15 10-15 10-15 10-15     Interval Training   Interval Training -- No No No No     Treadmill   MPH -- 1.7 1.2 -- --   Grade -- 0 0 -- --   Minutes -- 15 15 -- --   METs -- 2.3 1.92 -- --     NuStep   Level -- 4 4 4  --   Minutes -- 15 15 30  --   METs -- 3.1 2.7 3 --     Arm Ergometer   Level -- -- 1 -- --   Minutes -- -- 15 -- --   METs -- -- 1 -- --     T5 Nustep   Level -- -- -- 1 4   Minutes -- -- -- 15 15     Biostep-RELP   Level -- 1 -- -- --   Minutes -- 15 -- -- --   METs -- 2 -- -- --     Track   Laps -- 16 25 10 10    Minutes -- 15 15 15 15    METs -- 1.87 2.36 1.54 --     Oxygen   Maintain Oxygen Saturation -- 88% or higher 88% or higher 88% or higher 88% or higher    Row Name 02/06/23 0800 02/11/23 1600 02/20/23 1500 03/05/23 0900 03/21/23 1600     Response to Exercise   Blood Pressure (Admit) 104/60 -- 102/58 138/60 106/60   Blood Pressure (Exit)  98/60 -- 100/54 98/52 118/60   Heart Rate (Admit) 88 bpm -- 72 bpm 57 bpm 81 bpm   Heart Rate (Exercise) 97 bpm -- 85 bpm 81 bpm 112 bpm   Heart Rate (Exit) 75 bpm -- 82 bpm 60 bpm 59 bpm   Rating of Perceived Exertion (Exercise) 15 -- 15 15 15    Symptoms none --  none none none   Duration Continue with 30 min of aerobic exercise without signs/symptoms of physical distress. Continue with 30 min of aerobic exercise without signs/symptoms of physical distress. Continue with 30 min of aerobic exercise without signs/symptoms of physical distress. Continue with 30 min of aerobic exercise without signs/symptoms of physical distress. Continue with 30 min of aerobic exercise without signs/symptoms of physical distress.   Intensity THRR unchanged THRR unchanged THRR unchanged THRR unchanged THRR unchanged     Progression   Progression Continue to progress workloads to maintain intensity without signs/symptoms of physical distress. Continue to progress workloads to maintain intensity without signs/symptoms of physical distress. Continue to progress workloads to maintain intensity without signs/symptoms of physical distress. Continue to progress workloads to maintain intensity without signs/symptoms of physical distress. Continue to progress workloads to maintain intensity without signs/symptoms of physical distress.   Average METs 2.77 2.77 2.13 2.34 2.05     Resistance Training   Training Prescription Yes Yes Yes Yes Yes   Weight none  Pt's doctor did not give clearance for resistance training none  Pt's doctor did not give clearance for resistance training none  Pt's doctor did not give clearance for resistance training none  Pt's doctor did not give clearance for resistance training none  Pt's doctor did not give clearance for resistance training   Reps 10-15 10-15 10-15 10-15 10-15     Interval Training   Interval Training No No No No No     NuStep   Level 5 5 4 3 4    Minutes 15 15 15 15 15    METs  2.6 2.6 2.6 3 2.5     T5 Nustep   Level -- -- -- 3  T6 --   Minutes -- -- -- 15 --   METs -- -- -- 2.2 --     Track   Laps 10 10 3 15 11    Minutes 15 15 15 15 15    METs 1.54 1.54 1.16 1.82 1.6     Home Exercise Plan   Plans to continue exercise at -- Home (comment)  walking Home (comment)  walking Home (comment)  walking Home (comment)  walking   Frequency -- Add 2 additional days to program exercise sessions. Add 2 additional days to program exercise sessions. Add 2 additional days to program exercise sessions. Add 2 additional days to program exercise sessions.   Initial Home Exercises Provided -- 02/11/23 02/11/23 02/11/23 02/11/23     Oxygen   Maintain Oxygen Saturation 88% or higher 88% or higher 88% or higher 88% or higher 88% or higher            Exercise Comments:   Exercise Comments     Row Name 11/27/22 0941           Exercise Comments First full day of exercise!  Patient was oriented to gym and equipment including functions, settings, policies, and procedures.  Patient's individual exercise prescription and treatment plan were reviewed.  All starting workloads were established based on the results of the 6 minute walk test done at initial orientation visit.  The plan for exercise progression was also introduced and progression will be customized based on patient's performance and goals.  AAA parameters  set by his physician explained to patient.                Exercise Goals and Review:   Exercise Goals     Row Name 11/19/22 [phone]  Exercise Goals   Increase Physical Activity Yes       Intervention Provide advice, education, support and counseling about physical activity/exercise needs.;Develop an individualized exercise prescription for aerobic and resistive training based on initial evaluation findings, risk stratification, comorbidities and participant's personal goals.       Expected Outcomes Long Term: Add in home exercise to make  exercise part of routine and to increase amount of physical activity.;Long Term: Exercising regularly at least 3-5 days a week.;Short Term: Attend rehab on a regular basis to increase amount of physical activity.       Increase Strength and Stamina Yes       Intervention Provide advice, education, support and counseling about physical activity/exercise needs.;Develop an individualized exercise prescription for aerobic and resistive training based on initial evaluation findings, risk stratification, comorbidities and participant's personal goals.       Expected Outcomes Short Term: Increase workloads from initial exercise prescription for resistance, speed, and METs.;Short Term: Perform resistance training exercises routinely during rehab and add in resistance training at home;Long Term: Improve cardiorespiratory fitness, muscular endurance and strength as measured by increased METs and functional capacity ( )       Able to understand and use rate of perceived exertion (RPE) scale Yes       Intervention Provide education and explanation on how to use RPE scale       Expected Outcomes Short Term: Able to use RPE daily in rehab to express subjective intensity level;Long Term:  Able to use RPE to guide intensity level when exercising independently       Able to understand and use Dyspnea scale Yes       Intervention Provide education and explanation on how to use Dyspnea scale       Expected Outcomes Short Term: Able to use Dyspnea scale daily in rehab to express subjective sense of shortness of breath during exertion;Long Term: Able to use Dyspnea scale to guide intensity level when exercising independently       Knowledge and understanding of Target Heart Rate Range (THRR) Yes       Intervention Provide education and explanation of THRR including how the numbers were predicted and where they are located for reference       Expected Outcomes Short Term: Able to state/look up THRR;Short Term: Able to use  daily as guideline for intensity in rehab;Long Term: Able to use THRR to govern intensity when exercising independently       Able to check pulse independently Yes       Intervention Provide education and demonstration on how to check pulse in carotid and radial arteries.;Review the importance of being able to check your own pulse for safety during independent exercise       Expected Outcomes Short Term: Able to explain why pulse checking is important during independent exercise;Long Term: Able to check pulse independently and accurately       Understanding of Exercise Prescription Yes       Intervention Provide education, explanation, and written materials on patient's individual exercise prescription       Expected Outcomes Short Term: Able to explain program exercise prescription;Long Term: Able to explain home exercise prescription to exercise independently                Exercise Goals Re-Evaluation :  Exercise Goals Re-Evaluation     Row Name 11/27/22 0941 12/13/22 0818 12/26/22 0804 01/09/23 0941 01/15/23 0942     Exercise Goal Re-Evaluation  Exercise Goals Review Able to understand and use rate of perceived exertion (RPE) scale;Knowledge and understanding of Target Heart Rate Range (THRR);Understanding of Exercise Prescription;Able to understand and use Dyspnea scale Increase Physical Activity;Increase Strength and Stamina;Understanding of Exercise Prescription Increase Physical Activity;Increase Strength and Stamina;Understanding of Exercise Prescription Increase Physical Activity;Increase Strength and Stamina;Understanding of Exercise Prescription Increase Physical Activity;Increase Strength and Stamina;Understanding of Exercise Prescription   Comments Reviewed RPE  and dyspnea scale, THR and program prescription with pt today.  Pt voiced understanding and was given a copy of goals to take home. Ed is off to a good start in the program. He has done well on the treadmill so far, as he  increased his workload up to 1.7 mph with no incline. He also has done well at level one on the biostep and improved to level 4 on the T4 nustep. He has not any done any resistance training at this time due to not receiving clearance from his doctor. We will continue to monitor his progress in the program. Ed is doing well in rehab. He has continued to walk the track and increased from 16 laps to 25 laps! He also has has continued to do well at level 4 on the T4 nustep and level 1 on the arm ergometer. He has not done any resistance training at this time due to doctor's restrictions. We will continue to monitor his progress in the program. Ed is doing well in rehab. He has only walked the track once since the last review and was able to do 10 laps. He also continues to work at level 4 on the T4 nustep but was able to exercise for 30 minutes. He also began using the T5 nustep at level 1. We will continue to monitor his progress in the program. He is doing well at reahb on level 4 on T4 and T5. He goes to PT 2 times per week. Otherwise his exercise at home is limited. Encouraged him to continue to work on strength and balance.   Expected Outcomes Short: Use RPE daily to regulate intensity. Long: Follow program prescription in THR. Short:Continue to follow current exercise prescription. Long: Continue exercise to improve strength and stamina. Short: Increase workload on arm ergometer. Long: Continue exercise to improve strength and stamina. Short: Increase laps on the track back up to previous number. Long: Continue exercise to improve strength and stamina. Short: walk more at home, imporve strength and balance. Long: Continue to work on Adult nurse.    Row Name 01/24/23 1448 02/06/23 0814 02/11/23 1612 02/20/23 1550 03/05/23 0951     Exercise Goal Re-Evaluation   Exercise Goals Review Increase Physical Activity;Increase Strength and Stamina;Understanding of Exercise Prescription Increase Physical  Activity;Increase Strength and Stamina;Understanding of Exercise Prescription Increase Physical Activity;Able to understand and use rate of perceived exertion (RPE) scale;Knowledge and understanding of Target Heart Rate Range (THRR);Understanding of Exercise Prescription;Increase Strength and Stamina;Able to understand and use Dyspnea scale;Able to check pulse independently Increase Physical Activity;Increase Strength and Stamina;Understanding of Exercise Prescription Increase Physical Activity;Increase Strength and Stamina;Understanding of Exercise Prescription   Comments Ed continues to do well in rehab. He was recently able to increase his level on the T5 nustep from level 1 to 4. He was also able to maintain 10 track laps in 15 minutes. We will continue to monitor his progress in the program. Ed has only attended two sessions since the last review. He continues to do well walking the track and has consistently reached 10  laps. He also recently improved to level 5 on the T4 nustep. We will continue to monitor his progress in the program. Reviewed home exercise with pt today.  Pt plans to walk and continue with physical therapy for exercise.  Reviewed THR, pulse, RPE, sign and symptoms, pulse oximetery and when to call 911 or MD.  Also discussed weather considerations and indoor options.  Pt voiced understanding. Ed continues to attend rehab consistently. He has only walked the track once since the last review and was only able to do 3 laps. He also continues to work at level 4 on the T4 nustep. We will encourage him to continue to progressively increase his workloads in order to see progress in the program. We will continue to monitor his progress. Ed is doing well in rehab. He was able to increase his laps on the track to 15. He worked at level 3 on the T4 and T6 nusteps. We are continuing to encourage him to progessively increase his workloads in order to see progress in the program. We will continue to  monitor his progress in the program.   Expected Outcomes Short: Increase track laps, return to using the T4 nustep. Long: Continue exercise to improve strength and stamina. Short: Attend rehab more consistently. Long: Continue exercise to improve strength and stamina. Short: add 1-2 days a week of walking on off days of cardiac rehab and PT. Long: become independent with exercise routine. Short: Continue to progressively increase workloads and push for more laps on the track. Long: Continue exercise to improve strength and stamina. Short: Continue to progressively increase workloads on the track and nusteps. Long: Continue exercise to improve strength and stamina.    Row Name 03/14/23 1551 03/21/23 1607           Exercise Goal Re-Evaluation   Exercise Goals Review Increase Physical Activity;Increase Strength and Stamina;Understanding of Exercise Prescription Increase Physical Activity;Increase Strength and Stamina;Understanding of Exercise Prescription      Comments Ed continues to walk outside around his neighborhood on days that he doesnt come to rehab or PT. He states that he tries to do it when he feels comfortable with his balance. Ed is doing well in rehab. He only uses one machine each time he comes but has done well walking the track and using the T4 nustep at level 4. He also has not done any resistance training due to lifting restrictions. We will continue to monitor his progress in the program.      Expected Outcomes Short: Continue to exercise at home. Long: Continue exercise to improve strength and stamina. Short: Continue to to push for more laps on the track. Long: Continue exercise to improve strength and stamina.               Discharge Exercise Prescription (Final Exercise Prescription Changes):  Exercise Prescription Changes - 03/21/23 1600       Response to Exercise   Blood Pressure (Admit) 106/60    Blood Pressure (Exit) 118/60    Heart Rate (Admit) 81 bpm    Heart  Rate (Exercise) 112 bpm    Heart Rate (Exit) 59 bpm    Rating of Perceived Exertion (Exercise) 15    Symptoms none    Duration Continue with 30 min of aerobic exercise without signs/symptoms of physical distress.    Intensity THRR unchanged      Progression   Progression Continue to progress workloads to maintain intensity without signs/symptoms of physical distress.  Average METs 2.05      Resistance Training   Training Prescription Yes    Weight none   Pt's doctor did not give clearance for resistance training   Reps 10-15      Interval Training   Interval Training No      NuStep   Level 4    Minutes 15    METs 2.5      Track   Laps 11    Minutes 15    METs 1.6      Home Exercise Plan   Plans to continue exercise at Home (comment)   walking   Frequency Add 2 additional days to program exercise sessions.    Initial Home Exercises Provided 02/11/23      Oxygen   Maintain Oxygen Saturation 88% or higher             Nutrition:  Target Goals: Understanding of nutrition guidelines, daily intake of sodium 1500mg , cholesterol 200mg , calories 30% from fat and 7% or less from saturated fats, daily to have 5 or more servings of fruits and vegetables.  Education: All About Nutrition: -Group instruction provided by verbal, written material, interactive activities, discussions, models, and posters to present general guidelines for heart healthy nutrition including fat, fiber, MyPlate, the role of sodium in heart healthy nutrition, utilization of the nutrition label, and utilization of this knowledge for meal planning. Follow up email sent as well. Written material given at graduation. Flowsheet Row Cardiac Rehab from 11/19/2022 in [facility] Cardiac and Pulmonary Rehab  Education need identified 11/19/22       Biometrics:  Pre Biometrics - 11/19/22 1443       Pre Biometrics   Height 5' 11.5" (1.816 m)    Weight 205 lb 3.2 oz (93.1 kg)    Waist Circumference 43 inches     Hip Circumference 42 inches    Waist to Hip Ratio 1.02 %    BMI (Calculated) 28.22    Single Leg Stand 1.2 seconds              Nutrition Therapy Plan and Nutrition Goals:  Nutrition Therapy & Goals - 11/27/22 1131       Nutrition Therapy   Diet Carb controlled, Cardiac, low na    Protein (specify units) 90    Fiber 30 grams    Whole Grain Foods 3 servings    Saturated Fats 15 max. grams    Fruits and Vegetables 5 servings/day    Sodium 2 grams      Personal Nutrition Goals   Nutrition Goal Eat a protein at every meal and pair it with a carb    Personal Goal #2 Use protein snacks if needed    Personal Goal #3 Use sweets smartly, and make good snack choices    Comments patient drinking >64oz of water most days. Rarely drinks sugary drinks. He eats 3 meals per day. Has veteran in-home assistance to help him make meals. Likes to go out to restaurants for social interaction, but tries to make good food choices. Reviewed mediterranean diet handout. Educated on types of fats, sources, and how to read labels. He reports he doesn't use salt at home, commended him and educated about sodium limits, how processed foods can have lots in them before arriving to the table. Reviewed his 24hr food recall, encouraged more protein. Recommended greek yogurt, cottage cheese, hard boiled eggs, tuna, chicken, premier protein shakes as ways to boost protein intake. Build out several meals and  snacks with foods he likes and will eat, focusing on adequate protein intake, healthy fats and colorful plates with controlled portions of carbs.      Intervention Plan   Intervention Prescribe, educate and counsel regarding individualized specific dietary modifications aiming towards targeted core components such as weight, hypertension, lipid management, diabetes, heart failure and other comorbidities.;Nutrition handout(s) given to patient.    Expected Outcomes Long Term Goal: Adherence to prescribed nutrition  plan.;Short Term Goal: A plan has been developed with personal nutrition goals set during dietitian appointment.;Short Term Goal: Understand basic principles of dietary content, such as calories, fat, sodium, cholesterol and nutrients.             Nutrition Assessments:  MEDIFICTS Score Key: >=70 Need to make dietary changes  40-70 Heart Healthy Diet <= 40 Therapeutic Level Cholesterol Diet  Flowsheet Row Cardiac Rehab from 11/19/2022 in [facility] Cardiac and Pulmonary Rehab  Picture Your Plate Total Score on Admission 72      Picture Your Plate Scores: <40 Unhealthy dietary pattern with much room for improvement. 41-50 Dietary pattern unlikely to meet recommendations for good health and room for improvement. 51-60 More healthful dietary pattern, with some room for improvement.  >60 Healthy dietary pattern, although there may be some specific behaviors that could be improved.    Nutrition Goals Re-Evaluation:  Nutrition Goals Re-Evaluation     Row Name 01/15/23 0945 03/14/23 1604           Goals   Comment He reports he is doing well with his foods, including more veggies and lean meats. Cutting back on fatty beef and fried foods. He reports he is snacking less and choosing more colorful produce. He states that he is eating relatively well. He said he struggles to find meals to cook for himself, so he often goes out, but he claims that he does not eat fried foods.      Expected Outcome Short: continue to limit fatty meats and sugary beverages. Long: Work towards maintain heart healthy diet Short: continue to limit fatty meats and sugary beverages. Long: Work towards maintain heart healthy diet               Nutrition Goals Discharge (Final Nutrition Goals Re-Evaluation):  Nutrition Goals Re-Evaluation - 03/14/23 1604       Goals   Comment He states that he is eating relatively well. He said he struggles to find meals to cook for himself, so he often goes out, but he claims  that he does not eat fried foods.    Expected Outcome Short: continue to limit fatty meats and sugary beverages. Long: Work towards maintain heart healthy diet             Psychosocial: Target Goals: Acknowledge presence or absence of significant depression and/or stress, maximize coping skills, provide positive support system. Participant is able to verbalize types and ability to use techniques and skills needed for reducing stress and depression.   Education: Stress, Anxiety, and Depression - Group verbal and visual presentation to define topics covered.  Reviews how body is impacted by stress, anxiety, and depression.  Also discusses healthy ways to reduce stress and to treat/manage anxiety and depression.  Written material given at graduation.   Education: Sleep Hygiene -Provides group verbal and written instruction about how sleep can affect your health.  Define sleep hygiene, discuss sleep cycles and impact of sleep habits. Review good sleep hygiene tips.    Initial Review & Psychosocial Screening:  Initial Psych Review & Screening - 11/07/22 1003       Initial Review   Current issues with None Identified      Family Dynamics   Good Support System? Yes   lady friend     Barriers   Psychosocial barriers to participate in program There are no identifiable barriers or psychosocial needs.      Screening Interventions   Interventions Encouraged to exercise;To provide support and resources with identified psychosocial needs;Provide feedback about the scores to participant    Expected Outcomes Short Term goal: Utilizing psychosocial counselor, staff and physician to assist with identification of specific Stressors or current issues interfering with healing process. Setting desired goal for each stressor or current issue identified.;Long Term Goal: Stressors or current issues are controlled or eliminated.;Short Term goal: Identification and review with participant of any Quality of  Life or Depression concerns found by scoring the questionnaire.;Long Term goal: The participant improves quality of Life and PHQ9 Scores as seen by post scores and/or verbalization of changes             Quality of Life Scores:   Quality of Life - 11/19/22 1434       Quality of Life   Select Quality of Life      Quality of Life Scores   Health/Function Pre 22.37 %    Socioeconomic Pre 24.07 %    Psych/Spiritual Pre 22 %    Family Pre 30 %    GLOBAL Pre 23.2 %            Scores of 19 and below usually indicate a poorer quality of life in these areas.  A difference of  2-3 points is a clinically meaningful difference.  A difference of 2-3 points in the total score of the Quality of Life Index has been associated with significant improvement in overall quality of life, self-image, physical symptoms, and general health in studies assessing change in quality of life.  PHQ-9: Review Flowsheet  More data may exist      11/19/2022 01/12/2020 10/28/2019 09/09/2019 07/02/2019  Depression screen PHQ 2/9  Decreased Interest 1 3 2 1 2   Down, Depressed, Hopeless 1 0 0 0 1  PHQ - 2 Score 2 3 2 1 3   Altered sleeping 1 1 3 3 3   Tired, decreased energy 1 2 2  0 2  Change in appetite 1 1 1  0 1  Feeling bad or failure about yourself  0 0 0 0 0  Trouble concentrating 0 0 1 0 0  Moving slowly or fidgety/restless 0 0 1 0 0  Suicidal thoughts 0 1 0 0 0  PHQ-9 Score 5 8 10 4 9   Difficult doing work/chores Somewhat difficult Not difficult at all Somewhat difficult Not difficult at all Not difficult at all   Interpretation of Total Score  Total Score Depression Severity:  1-4 = Minimal depression, 5-9 = Mild depression, 10-14 = Moderate depression, 15-19 = Moderately severe depression, 20-27 = Severe depression   Psychosocial Evaluation and Intervention:  Psychosocial Evaluation - 11/07/22 1020       Psychosocial Evaluation & Interventions   Comments Ed has no barriers to attending the prgram.  He has been in program before and wants to work on his leg strength and his balance. He is in PT at this time for balance concerns. He states no concerns with stress. He is working with his physician over his medication. He is ready to start    Expected  Outcomes STGAttend all scheduled sessions, work on exercise progression while concentrating on leg strength and improved balance LTG Continues working on exercie progression after discharge    Continue Psychosocial Services  Follow up required by staff             Psychosocial Re-Evaluation:  Psychosocial Re-Evaluation     Row Name 01/15/23 0948 03/14/23 1558           Psychosocial Re-Evaluation   Current issues with Current Sleep Concerns Current Sleep Concerns      Comments Reports his sleep has not been good, taking medication and says it helps him. Otherwise he reports no stress or anxiety or depression. Reports he is complinat with his anti-depressant medication Ed still has trouble sleeping, he was prescribed sleeping medication but states that it made him hallucinate so he stopped. HE claims that he has no current stressors at this time. He states that he still takes his anti-depressant medication.      Expected Outcomes Short: focus on maintain regular sleep habits and mental health. Long: Maintain positvie attitude Short: focus on maintain regular sleep habits and mental health. Long: Maintain positvie attitude      Interventions Encouraged to attend Cardiac Rehabilitation for the exercise Encouraged to attend Cardiac Rehabilitation for the exercise      Continue Psychosocial Services  Follow up required by staff --               Psychosocial Discharge (Final Psychosocial Re-Evaluation):  Psychosocial Re-Evaluation - 03/14/23 1558       Psychosocial Re-Evaluation   Current issues with Current Sleep Concerns    Comments Ed still has trouble sleeping, he was prescribed sleeping medication but states that it made him  hallucinate so he stopped. HE claims that he has no current stressors at this time. He states that he still takes his anti-depressant medication.    Expected Outcomes Short: focus on maintain regular sleep habits and mental health. Long: Maintain positvie attitude    Interventions Encouraged to attend Cardiac Rehabilitation for the exercise             Vocational Rehabilitation: Provide vocational rehab assistance to qualifying candidates.   Vocational Rehab Evaluation & Intervention:  Vocational Rehab - 11/07/22 1019       Initial Vocational Rehab Evaluation & Intervention   Assessment shows need for Vocational Rehabilitation No      Vocational Rehab Re-Evaulation   Comments retired             Education: Education Goals: Education classes will be provided on a variety of topics geared toward better understanding of heart health and risk factor modification. Participant will state understanding/return demonstration of topics presented as noted by education test scores.  Learning Barriers/Preferences:  Learning Barriers/Preferences - 11/07/22 1008       Learning Barriers/Preferences   Learning Barriers None    Learning Preferences None             General Cardiac Education Topics:  AED/CPR: - Group verbal and written instruction with the use of models to demonstrate the basic use of the AED with the basic ABC's of resuscitation.   Anatomy and Cardiac Procedures: - Group verbal and visual presentation and models provide information about basic cardiac anatomy and function. Reviews the testing methods done to diagnose heart disease and the outcomes of the test results. Describes the treatment choices: Medical Management, Angioplasty, or Coronary Bypass Surgery for treating various heart conditions including Myocardial Infarction, Angina, Valve  Disease, and Cardiac Arrhythmias.  Written material given at graduation. Flowsheet Row Cardiac Rehab from 11/19/2022 in [facility]  Cardiac and Pulmonary Rehab  Education need identified 11/19/22       Medication Safety: - Group verbal and visual instruction to review commonly prescribed medications for heart and lung disease. Reviews the medication, class of the drug, and side effects. Includes the steps to properly store meds and maintain the prescription regimen.  Written material given at graduation. Flowsheet Row Cardiac Rehab from 10/07/2019 in [facility] Cardiac and Pulmonary Rehab  Date 10/07/19  Educator SB  Instruction Review Code 1- Verbalizes Understanding       Intimacy: - Group verbal instruction through game format to discuss how heart and lung disease can affect sexual intimacy. Written material given at graduation..   Know Your Numbers and Heart Failure: - Group verbal and visual instruction to discuss disease risk factors for cardiac and pulmonary disease and treatment options.  Reviews associated critical values for Overweight/Obesity, Hypertension, Cholesterol, and Diabetes.  Discusses basics of heart failure: signs/symptoms and treatments.  Introduces Heart Failure Zone chart for action plan for heart failure.  Written material given at graduation.   Infection Prevention: - Provides verbal and written material to individual with discussion of infection control including proper hand washing and proper equipment cleaning during exercise session. Flowsheet Row Cardiac Rehab from 11/19/2022 in [facility] Cardiac and Pulmonary Rehab  Date 11/19/22  Educator NT  Instruction Review Code 1- Verbalizes Understanding       Falls Prevention: - Provides verbal and written material to individual with discussion of falls prevention and safety. Flowsheet Row Cardiac Rehab from 11/19/2022 in [facility] Cardiac and Pulmonary Rehab  Date 11/19/22  Educator NT  Instruction Review Code 1- Verbalizes Understanding       Other: -Provides group and verbal instruction on various topics (see comments)   Knowledge  Questionnaire Score:  Knowledge Questionnaire Score - 11/19/22 1429       Knowledge Questionnaire Score   Pre Score 21/26             Core Components/Risk Factors/Patient Goals at Admission:  Personal Goals and Risk Factors at Admission - 11/07/22 1008       Core Components/Risk Factors/Patient Goals on Admission    Weight Management Yes;Weight Maintenance    Intervention Weight Management: Develop a combined nutrition and exercise program designed to reach desired caloric intake, while maintaining appropriate intake of nutrient and fiber, sodium and fats, and appropriate energy expenditure required for the weight goal.    Admit Weight 204 lb (92.5 kg)    Goal Weight: Long Term 204 lb (92.5 kg)    Expected Outcomes Short Term: Continue to assess and modify interventions until short term weight is achieved;Long Term: Adherence to nutrition and physical activity/exercise program aimed toward attainment of established weight goal;Weight Maintenance: Understanding of the daily nutrition guidelines, which includes 25-35% calories from fat, 7% or less cal from saturated fats, less than 200mg  cholesterol, less than 1.5gm of sodium, & 5 or more servings of fruits and vegetables daily    Diabetes Yes    Intervention Provide education about signs/symptoms and action to take for hypo/hyperglycemia.;Provide education about proper nutrition, including hydration, and aerobic/resistive exercise prescription along with prescribed medications to achieve blood glucose in normal ranges: Fasting glucose 65-99 mg/dL    Expected Outcomes Short Term: Participant verbalizes understanding of the signs/symptoms and immediate care of hyper/hypoglycemia, proper foot care and importance of medication, aerobic/resistive exercise and nutrition plan for  blood glucose control.;Long Term: Attainment of HbA1C < 7%.    Heart Failure Yes    Intervention Provide a combined exercise and nutrition program that is supplemented  with education, support and counseling about heart failure. Directed toward relieving symptoms such as shortness of breath, decreased exercise tolerance, and extremity edema.    Expected Outcomes Improve functional capacity of life;Short term: Attendance in program 2-3 days a week with increased exercise capacity. Reported lower sodium intake. Reported increased fruit and vegetable intake. Reports medication compliance.;Short term: Daily weights obtained and reported for increase. Utilizing diuretic protocols set by physician.;Long term: Adoption of self-care skills and reduction of barriers for early signs and symptoms recognition and intervention leading to self-care maintenance.    Hypertension Yes    Intervention Provide education on lifestyle modifcations including regular physical activity/exercise, weight management, moderate sodium restriction and increased consumption of fresh fruit, vegetables, and low fat dairy, alcohol moderation, and smoking cessation.;Monitor prescription use compliance.    Expected Outcomes Short Term: Continued assessment and intervention until BP is < 140/33mm HG in hypertensive participants. < 130/29mm HG in hypertensive participants with diabetes, heart failure or chronic kidney disease.;Long Term: Maintenance of blood pressure at goal levels.    Lipids Yes    Intervention Provide education and support for participant on nutrition & aerobic/resistive exercise along with prescribed medications to achieve LDL 70mg , HDL >40mg .    Expected Outcomes Short Term: Participant states understanding of desired cholesterol values and is compliant with medications prescribed. Participant is following exercise prescription and nutrition guidelines.;Long Term: Cholesterol controlled with medications as prescribed, with individualized exercise RX and with personalized nutrition plan. Value goals: LDL < 70mg , HDL > 40 mg.             Education:Diabetes - Individual verbal and  written instruction to review signs/symptoms of diabetes, desired ranges of glucose level fasting, after meals and with exercise. Acknowledge that pre and post exercise glucose checks will be done for 3 sessions at entry of program. Flowsheet Row Cardiac Rehab from 01/13/2020 in [facility] Cardiac and Pulmonary Rehab  Date 01/13/20  Educator [facility]  Instruction Review Code 1- Verbalizes Understanding       Core Components/Risk Factors/Patient Goals Review:   Goals and Risk Factor Review     Row Name 01/15/23 0951 03/14/23 1611           Core Components/Risk Factors/Patient Goals Review   Personal Goals Review Weight Management/Obesity;Hypertension;Diabetes;Lipids Weight Management/Obesity;Hypertension;Diabetes;Lipids      Review He continue to work on watching his weight, checking his blood sugars now back on metformin . He is checking his blood pressure at home and it has been stable He continues to strive for a heart healthy diet and continue to get exercise in order to manage his weight. Ed is still taking his blood pressure daily, and checking his blood sugar, he is currently experiencing high blood sugar and is meeting with his doctor about that soon.      Expected Outcomes Short: continue to check blood sugars and blood pressure. Long: Work towards healthy weight and more stable A1C and blood pressures Short: continue to check blood sugars and blood pressure. Long: Work towards healthy weight and more stable A1C and blood pressures               Core Components/Risk Factors/Patient Goals at Discharge (Final Review):   Goals and Risk Factor Review - 03/14/23 1611       Core Components/Risk Factors/Patient Goals Review   Personal  Goals Review Weight Management/Obesity;Hypertension;Diabetes;Lipids    Review He continues to strive for a heart healthy diet and continue to get exercise in order to manage his weight. Ed is still taking his blood pressure daily, and checking his blood sugar, he  is currently experiencing high blood sugar and is meeting with his doctor about that soon.    Expected Outcomes Short: continue to check blood sugars and blood pressure. Long: Work towards healthy weight and more stable A1C and blood pressures             ITP Comments:  ITP Comments     Row Name 11/07/22 1019 11/19/22 1426 11/27/22 0939 12/12/22 1229 01/09/23 1516   ITP Comments Virtual orientation call completed today. he has an appointment on Date: [id number]  for EP eval and gym Orientation.  Documentation of diagnosis [facility] be found in [facility]  Date: 10/13/2022  Letter sent for AAA parameters. Completed and gym orientation. Initial ITP created and sent for review to Dr. Eddy Goodell, Medical Director. First full day of exercise!  Patient was oriented to gym and equipment including functions, settings, policies, and procedures.  Patient's individual exercise prescription and treatment plan were reviewed.  All starting workloads were established based on the results of the 6 minute walk test done at initial orientation visit.  The plan for exercise progression was also introduced and progression will be customized based on patient's performance and goals. AAA parameters  set by his physician explained to patient. 30 Day review completed. Medical Director ITP review done, changes made as directed, and signed approval by Medical Director.    new to program 30 Day review completed. Medical Director ITP review done, changes made as directed, and signed approval by Medical Director.    Row Name 01/30/23 1115 02/27/23 1134 03/27/23 1459       ITP Comments 30 Day review completed. Medical Director ITP review done, changes made as directed, and signed approval by Medical Director. 30 Day review completed. Medical Director ITP review done, changes made as directed, and signed approval by Medical Director. 30 Day review completed. Medical Director ITP review done, changes made as directed, and signed approval  by Medical Director.              Comments: 30 day review

## 2023-03-27 NOTE — Therapy (Signed)
OUTPATIENT PHYSICAL THERAPY LOWER EXTREMITY TREATMENT/PROGRESS NOTE/ Re-certification through 04/08/23    Patient Name: Devin [name] [facility]. MRN: [id number] DOB:1945-11-04, 78 y.o., male Today's Date: 02/13/23  END OF SESSION:  PT End of Session - 03/27/23 1235     Visit Number 36    Number of Visits 42    Date for PT Re-Evaluation 04/08/23    Authorization Type re-cert/PN done today x 3 weeks (6 additional visits)- planning to transition to HEP 04/08/23    PT Start Time 1235    PT Stop Time 1325    PT Time Calculation (min) 50 min    Equipment Utilized During Treatment Gait belt    Activity Tolerance Patient tolerated treatment well    Behavior During Therapy WFL for tasks assessed/performed                  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   Hypertension    Ischemic cardiomyopathy    MI, old    Peripheral neuropathy    PFO (patent foramen ovale) 05/2019   s/p repair   Sleep apnea    Spinal stenosis of lumbar region    TIA (transient ischemic attack)    Past Surgical History:  Procedure Laterality Date   APPENDECTOMY     BREAST SURGERY Right    benign mass   CARDIOVERSION Right 12/15/2019   Procedure: CARDIOVERSION;  Surgeon: Laurier Nancy, MD;  Location: [facility] ORS;  Service: Cardiovascular;  Laterality: Right;   CATARACT EXTRACTION, BILATERAL     COLONOSCOPY     CORONARY ANGIOPLASTY WITH STENT PLACEMENT     CORONARY ARTERY BYPASS GRAFT  05/2019   LIMA-LAD   CORONARY STENT INTERVENTION N/A 10/15/2022   Procedure: CORONARY STENT INTERVENTION;  Surgeon: Marcina Millard, MD;  Location: [facility]  INVASIVE CV LAB;  Service: Cardiovascular;  Laterality: N/A;   LEFT HEART CATH N/A 10/15/2022   Procedure: Left Heart Cath;  Surgeon: Marcina Millard, MD;  Location: [facility] INVASIVE CV LAB;  Service: Cardiovascular;  Laterality: N/A;   LEFT HEART CATH AND CORS/GRAFTS ANGIOGRAPHY N/A 05/21/2019   Procedure: LEFT HEART CATH AND CORONARY ANGIOGRAPHY;  Surgeon: Laurier Nancy, MD;  Location: [facility] INVASIVE CV LAB;  Service: Cardiovascular;  Laterality: N/A;   LUMBAR LAMINECTOMY/DECOMPRESSION MICRODISCECTOMY N/A 07/04/2020   Procedure: L4-5 LAMINECTOMY;  Surgeon: Lucy Chris, MD;  Location: [facility] ORS;  Service: Neurosurgery;  Laterality: N/A;   REPLACEMENT TOTAL KNEE BILATERAL     Patient Active Problem List   Diagnosis Date Noted   NSTEMI (non-ST elevated myocardial infarction) (HCC) 10/13/2022   HFrEF (heart failure with reduced ejection fraction) (HCC) 10/13/2022   Degenerative joint disease (DJD) of lumbar spine 10/12/2022   Frequent falls 05/15/2022   Unsteady gait 04/16/2022   Acute delirium 04/13/2022   Electrolyte abnormality 04/13/2022   Nausea & vomiting 04/13/2022   PAD (peripheral artery disease) (HCC) 09/03/2021   Localized, primary osteoarthritis 11/24/2020   Wound healing, delayed 09/08/2020   History of lumbar surgery 09/08/2020   Chronic ischemic heart disease 07/13/2020   History  of MI (myocardial infarction) 07/13/2020   History of PTCA 1 07/13/2020   Other personal history presenting hazards to health 07/13/2020   Pure hypercholesterolemia 07/13/2020   Elevated lactic acid level 04/19/2020   Generalized weakness 04/09/2020   Fall at home 04/09/2020   Gastroenteritis 04/09/2020   AMS (altered mental status) 04/08/2020   Hypoxia 02/01/2020   Atrial fibrillation status post cardioversion (HCC) 12/22/2019   Congestive heart failure (CHF) (HCC) 12/13/2019   Hyponatremia 12/06/2019   Acute postoperative anemia due to expected blood loss 05/28/2019   Hyperlipidemia  05/28/2019   Ischemic cardiomyopathy 05/28/2019   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   Unstable angina (HCC) 05/24/2019   Chest pain 05/21/2019   Ischemic chest pain (HCC) 05/16/2019   CAD (coronary artery disease) 05/16/2019   HTN (hypertension) 05/16/2019   Depression 05/16/2019   GERD (gastroesophageal reflux disease) 05/16/2019   Verruca plantaris 01/23/2018   Obstructive sleep apnea syndrome 01/20/2018   Major depressive disorder, single episode, moderate (HCC) 07/05/2016   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 Zada Finders, MD  REFERRING PROVIDER: Dr. Zada Finders, MD/ [name], [facility]  REFERRING DIAG:  R26.9 (ICD-10-CM) - Unspecified abnormalities of gait and mobility  R26.89 (ICD-10-CM) - Other abnormalities of gait and mobility  R29.6 (ICD-10-CM) - Repeated falls    THERAPY DIAG:  Muscle weakness (generalized)  Balance problem  Rationale for Evaluation and Treatment: Rehabilitation  ONSET DATE: at least 2 years  SUBJECTIVE: (From initial evaluation note)  SUBJECTIVE STATEMENT: Pt reports he is having difficulty with gait and balance.  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   He feels like his balance and walking difficulty have worsened recently.  He reports he started falling frequently, he was catching his foot on things and tripping.  The most recent major fall was about 6 months ago.  He is being careful since then and trying to walk more to get stronger and trying to be more aware of his gait pattern.  He was walking with a walker until ~4 months ago, then started using a cane, and  then has tried walking without any assistance.      He feels like his difficulty walking limits his ability to get out of the house as much as he used to because he is fearful of falling.  PLOF- he enjoys getting out of the house for social activities, going out to breakfast too; going grocery shopping a few times/week to get his groceries; prior to his back surgery he walked at the park in [place], hasn't been back though because he is worried about falling though.  He has a home aide that comes a few days a week to assist with household tasks as needed.  Also reports he had a MI a little over a week ago and was in the hospital (8/3-10/16/22)- he states he is referred to begin cardiac rehab as part of his outpatient recovery.  PERTINENT HISTORY: Extensive cardiac history History of lumbar spine surgery- 2022 L4-5 microdiscectomy/laminectomy   PAIN:  Are you having pain? R groin muscle area where he had the cardiac catheterization procedure last week.  PRECAUTIONS: Fall  RED FLAGS: Pt with recent MI (10/13/22) with stent placement (see chart review)  WEIGHT BEARING RESTRICTIONS: No  FALLS:  Has patient fallen in last 6 months? Yes. Number of falls multiple  LIVING ENVIRONMENT: Lives with: lives alone (has a self reported "lady friend" for social support and enjoys being able to get out the house for community activities, going to restaurants together) Lives in: House/apartment Stairs:  he lives in a single level home with bonus room over the garage, but he does not need to go upstairs.1 step to enter in the garage- able to get in without falling. Has following equipment at home: Single point cane, Walker - 4 wheeled, and Wheelchair (manual) Has a home health aide that comes 2-3x/week if he needs assistance  OCCUPATION: retired  PLOF: Independent  PATIENT GOALS: to address drop foot and be able to manage it better; to strengthen his legs; and to increase his breathing; and improve his  balance   NEXT MD VISIT: yes, next week   OBJECTIVE:   DIAGNOSTIC FINDINGS: no recent lower extremity/spine imaging  PATIENT SURVEYS:  FOTO 39/52  COGNITION: Overall cognitive status: Within functional limits for tasks assessed     SENSATION: Pt reports reduced sensation to light touch L L4-5 dermatomes  EDEMA:  No pitting edema noted on dorsum of foot   POSTURE: pt stands with wide base of support for balance; uses UE support on LE's for sit to stand transfer  PALPATION: (-) TTP distal LE b/l  LOWER EXTREMITY ROM:  Active ROM Right eval Left eval  Hip flexion WNL WNL  Hip extension    Hip abduction    Hip adduction    Hip internal rotation    Hip external rotation    Knee flexion 105 105  Knee extension -5 -5  Ankle dorsiflexion 5 deg PROM 0 deg  Ankle plantarflexion    Great toe extension 45 deg PROM 40 deg   Ankle eversion     (Blank rows = not tested)  LOWER EXTREMITY MMT:  MMT Right eval Left eval  Hip flexion 4 4  Hip extension    Hip abduction    Hip adduction    Hip internal rotation    Hip external rotation    Knee flexion 5 5  Knee extension 4 4  Ankle dorsiflexion 4/5 1/5  Ankle plantarflexion 3/5 3/5  Great toe extension 4/5 2/5  Ankle eversion 4/5 3/5   (Blank rows = not tested)   FUNCTIONAL TESTS:  OUTCOME MEASURES: TEST Outcome Interpretation  5 times sit<>stand 21 seconds >60 yo, >15 sec indicates increased risk for falls  10 meter walk test deferred <1.0 m/s indicates increased risk for falls; limited community ambulator  [employer] Assessment /56   deferred <36/56 (100% risk for falls), 37-45 (80% risk for falls); 46-51 (>50% risk for falls); 52-55 (lower risk <25% of falls)      GAIT: Distance walked: from waiting area into PT gym 50 ft, PT supervision Assistive device utilized: None Level of assistance: Complete Independence Comments: Pt ambulates with decreased gait speed, wide BOS, decreased stride length, excess L  hip flexion strategy to compensate for L foot drop  Pt not able to stand SLS on L without UE support today; able to stand R LE x 5 seconds  TODAY'S TREATMENT:  DATE: 03/27/23 Subjective: Pt has no new concerns this morning, states he did check to make sure his gym membership is activated so he can transition to an exercise program after completing this course of PT  Pain: 0/10  Objective:  Therapeutic Exercises:  While wearing L AFO, no SPC during session, gait belt and PT supervision during standing exercises  Nustep x 10 min, level 3-4 for knee mobility/LE strengthening at start of session, PT obtained hx/subjective portion of session today during this too- not today  Knee flexion stretch with foot on step: R and L 5x ea, x15-20 seconds ea  Front step ups: L LE x10, R LE x10  Ascend/descend stairs 3x (4 steps) with b/l railing, using step to gait pattern  Split squat: in parallel bars, L foot in front 3 sets to fatigue (12-15 reps), R foot in front 3 sets to fatigue (6-7 reps)- not today  Squats: holding 5 lb medicine ball 3x8  Hurdles: tall/short alternating, forward direction in a circular CW and CCW direction, 3 laps, PT SBA and occasionally CGA for tall hurdles  Forward walking, reverse walking, side stepping, turning 180 degrees with gait direction change on flat surface in hallway- 4 laps, good balance with this today  Dynamic balance activity for multidirectional stepping, motor planning/sequencing, short term memory recall, PT verbal/visual/demonstration cues a SBA with gait belt needed for sequencing- rumba box (forward, L, reverse, R), side stepping R/L x2 ea, grape vine R/L, feet together weight shifts- practiced 8x today at 2x during session  Squat to pick up cones from floor 15x   Not today: Airex: Ball toss at trampoline, standing on airex (5  lb) 3x10 with PT SBA- to practice reaction to external stimuli/balance training Airex: feet together look up/down and R/L 1 min ea- not today  Airex: feet together and PT manual perturbations from various directions at trunk/pelvis- not today    PATIENT EDUCATION:  Education details: PT POC/goals, importance of attending cardiac rehab, activity pacing Person educated: Patient Education method: Explanation Education comprehension: verbalized understanding and needs further education  HOME EXERCISE PROGRAM: Access Code: [email or web address] URL: [email or web address] Date: 10/24/2022 Prepared by: Max Fickle  Exercises - Seated Heel Raise  - 1 x daily - 3 x weekly - 2 sets - 20 reps - Seated March  - 1 x daily - 3 x weekly - 2 sets - 10 reps - Seated Long Arc Quad  - 1 x daily - 3 x weekly - 2 sets - 10 reps  ASSESSMENT:  CLINICAL IMPRESSION: Pt demonstrated ability to ascend/descend the stairs in clinic today with the best control/strength I have observed during this course of PT.  This is significant progress as ascending/descending the stairs in his home is one pt's goals for PT.  Use of hand rail is required for safety, but he demonstrated sufficient strength to navigate a flight of stairs during session today.  Will assess again at next visit.  Tolerated LE strengthening and balance exercises well.  He demonstrated good balance during ambulation in various directions at end of session today.  Likely has not met maximum recovery with skilled PT.  I did discuss a goal for transitioning to a regular strengthening program at the gym to continue exercising long term when he completes this course of PT.  Anticipate transition to HEP/DC within next few weeks.   OBJECTIVE IMPAIRMENTS: Abnormal gait, cardiopulmonary status limiting activity, decreased activity tolerance, decreased balance, decreased mobility, difficulty walking, decreased ROM, and decreased strength.  ACTIVITY  LIMITATIONS: standing, squatting, stairs, transfers, and locomotion level  PARTICIPATION LIMITATIONS: meal prep, cleaning, laundry, interpersonal relationship, shopping, and community activity  PERSONAL FACTORS: Age, Past/current experiences, Time since onset of injury/illness/exacerbation, and cardiovascular hx including recent MI with stent placement last week  are also affecting patient's functional outcome.   REHAB POTENTIAL: Good  CLINICAL DECISION MAKING: Stable/uncomplicated  EVALUATION COMPLEXITY: Low   GOALS: Goals reviewed with patient? Yes  SHORT TERM GOALS: Target date: 11/11/22 Pt will be able to perform a HEP for LE strengthening >3x/week Baseline: 11/27/22 pt has been instructed on a HEP; 12/24/22: met Goal status: Met   LONG TERM GOALS: Target date: 04/08/23  Improve FOTO to >51 indicating pt able to perform his daily activities without being limited by his balance; 12/24/22: in progress Baseline: 39; 10/7: 54; 01/21/23 51; 02/18/23: in progress; 03/18/23: 51 Goal status: In Progress  2.  Pt will be able to amb with typical width BOS and b/l heel strike gait pattern using least restrictive AD and AFO on flat/incline/decline/grass surfaces x 10 min Baseline: not using any AD or AFO in clinic, pt wide BOS, no L heel strike due to foot drop; 11/27/22 pt is amb with SPC now, discussions continue regarding AFO/brace for improved safety; 12/24/22: pt now wearing AFO and using SPC on uneven surfaces with PT SBA x 5 minutes; 01/21/23: in progress- able to amb intervals of 5-6 min x 2 with 1-2 min sitting break between; 03/18/23: using SPC for >5 min on incline up driveway/neighborhood, he would like to be able to do without SPC again as this was PLOF Goal status: In progress  3.  Pt will be able to perform 5x STS in <15 sec indicating reduced risk for falling Baseline: 21 sec, 11/27/22 19 sec (with mild UE use); 12/28/22: 18 seconds  (with mild UE support) 01/21/23:  19 seconds without  UE support; 02/18/23 17 seconds without UE support; 03/18/23: 19 seconds without UE support Goal status: In Progress  4. Pt will be able to amb x 1 block distance to walk his dog 1x/day.  Baseline: 1/4-1/2 block (pt self report), 01/21/23: amb 1 loop around building in 5.25 minutes at clinic; 03/18/23- did not assess due to inclement weather  Goal status: In progress  5. Improve 10 M walk test to >1.0 m/s to promote reduced fall risk and improved community ambulator status  Baseline: 12/24/22 .9 m/s; 01/21/23: .8 m/s (at end of session, was fatigued)  Goal status: In progress   6. Pt will independently be able to transfer stand to floor and floor to standing with use of chair/sturdy surface to simulate being able to get up from floor and to reduce fear/risk of falling  Baseline: pt has not tried and expresses fear of not being able to get up from floor living alone; 03/18/23: see above objective section of note; in progress  Goal status: In progress 7. Pt will be able to ascend 1 flight of stairs in his home 2x/day to be able to access second floor of his home independently as he lives at home alone  Baseline: 03/18/23 pt is not doing, fear of falling, and notes feeling "weak"   Goal status: new   8. Pt will transition to independent exercise program 2-3x/week for strengthening at his local fitness center after completing this course of PT    Baseline: 03/18/23 pt states he will reinstate his membership this week   Goal status: new   PLAN:  PT FREQUENCY:  1-2x/week  PT DURATION: 3 weeks  PLANNED INTERVENTIONS: Therapeutic exercises, Therapeutic activity, Neuromuscular re-education, Balance training, Gait training, Patient/Family education, Self Care, Joint mobilization, and Orthotic/Fit training  PLAN FOR NEXT SESSION: continue with proximal hip and LE strengthening, multidirectional movements/dynamic balance activities, improving confidence/reducing fear of falling through activities in small  spaces and that require increased reaction speed to unpredictable external stimuli; fall recovery/floor transfers training; updated PT and pt goals today; plan to transition to independent strengthening within the next month  Max Fickle, PT, DPT, OCS  Physical Therapist - [facility]  [facility]   Ardine Bjork, PT  12:35 PM

## 2023-03-28 ENCOUNTER — Encounter: Payer: Medicare HMO | Admitting: *Deleted

## 2023-03-28 DIAGNOSIS — Z955 Presence of coronary angioplasty implant and graft: Secondary | ICD-10-CM

## 2023-03-28 DIAGNOSIS — Z48812 Encounter for surgical aftercare following surgery on the circulatory system: Secondary | ICD-10-CM | POA: Diagnosis not present

## 2023-03-28 DIAGNOSIS — I214 Non-ST elevation (NSTEMI) myocardial infarction: Secondary | ICD-10-CM

## 2023-03-28 NOTE — Progress Notes (Signed)
Daily Session Note  Patient Details  Name: Devin [name]. MRN: [id number] Date of Birth: 1945/10/31 Referring Provider:   Flowsheet Row Cardiac Rehab from 11/19/2022 in [facility]  Referring Provider Dr. Adrian Blackwater MD       Encounter Date: 03/28/2023  Check In:  Session Check In - 03/28/23 1554       Check-In   Supervising physician immediately available to respond to emergencies See telemetry face sheet for immediately available ER MD    Location [facility]-Cardiac & Pulmonary Rehab    Staff Present Cora Collum, RN, BSN, CCRP;Joseph Hood RCP,RRT,BSRT;Meredith Craven RN,BSN;Noah [place], [place], Exercise Physiologist    Virtual Visit No    Medication changes reported     No    Fall or balance concerns reported    No    Warm-up and Cool-down Performed on first and last piece of equipment    Resistance Training Performed Yes    VAD Patient? No    PAD/SET Patient? No      Pain Assessment   Currently in Pain? No/denies                Social History   Tobacco Use  Smoking Status Former  Smokeless Tobacco Never  Tobacco Comments   Quit over 40 years ago    Goals Met:  Independence with exercise equipment Exercise tolerated well No report of concerns or symptoms today  Goals Unmet:  Not Applicable  Comments: Pt able to follow exercise prescription today without complaint.  Will continue to monitor for progression.    Dr. Bethann Punches is Medical Director for [facility] Card[facility].  Dr. Vida Rigger is Medical Director for [facility] Pulmonary Rehabilitation.

## 2023-04-01 ENCOUNTER — Ambulatory Visit: Payer: Medicare HMO

## 2023-04-01 DIAGNOSIS — R2689 Other abnormalities of gait and mobility: Secondary | ICD-10-CM

## 2023-04-01 DIAGNOSIS — M6281 Muscle weakness (generalized): Secondary | ICD-10-CM

## 2023-04-01 NOTE — Therapy (Signed)
OUTPATIENT PHYSICAL THERAPY LOWER EXTREMITY TREATMENT/PROGRESS NOTE/ Re-certification through 04/08/23    Patient Name: Devin [name]. MRN: [id number] DOB:1945/05/29, 78 y.o., male Today's Date: 02/13/23  END OF SESSION:  PT End of Session - 04/01/23 1218     Visit Number 37    Number of Visits 42    Date for PT Re-Evaluation 04/08/23    Authorization Type re-cert/PN done today x 3 weeks (6 additional visits)- planning to transition to HEP 04/08/23    PT Start Time 1205    PT Stop Time 1245    PT Time Calculation (min) 40 min    Equipment Utilized During Treatment Gait belt    Activity Tolerance Patient tolerated treatment well    Behavior During Therapy WFL for tasks assessed/performed                  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   Hypertension    Ischemic cardiomyopathy    MI, old    Peripheral neuropathy    PFO (patent foramen ovale) 05/2019   s/p repair   Sleep apnea    Spinal stenosis of lumbar region    TIA (transient ischemic attack)    Past Surgical History:  Procedure Laterality Date   APPENDECTOMY     BREAST SURGERY Right    benign mass   CARDIOVERSION Right 12/15/2019   Procedure: CARDIOVERSION;  Surgeon: Laurier Nancy, MD;  Location: [facility] ORS;  Service: Cardiovascular;  Laterality: Right;   CATARACT EXTRACTION, BILATERAL     COLONOSCOPY     CORONARY ANGIOPLASTY WITH STENT PLACEMENT     CORONARY ARTERY BYPASS GRAFT  05/2019   LIMA-LAD   CORONARY STENT INTERVENTION N/A 10/15/2022   Procedure: CORONARY STENT INTERVENTION;  Surgeon: Marcina Millard, MD;  Location: [facility]  INVASIVE CV LAB;  Service: Cardiovascular;  Laterality: N/A;   LEFT HEART CATH N/A 10/15/2022   Procedure: Left Heart Cath;  Surgeon: Marcina Millard, MD;  Location: [facility] INVASIVE CV LAB;  Service: Cardiovascular;  Laterality: N/A;   LEFT HEART CATH AND CORS/GRAFTS ANGIOGRAPHY N/A 05/21/2019   Procedure: LEFT HEART CATH AND CORONARY ANGIOGRAPHY;  Surgeon: Laurier Nancy, MD;  Location: [facility] INVASIVE CV LAB;  Service: Cardiovascular;  Laterality: N/A;   LUMBAR LAMINECTOMY/DECOMPRESSION MICRODISCECTOMY N/A 07/04/2020   Procedure: L4-5 LAMINECTOMY;  Surgeon: Lucy Chris, MD;  Location: [facility] ORS;  Service: Neurosurgery;  Laterality: N/A;   REPLACEMENT TOTAL KNEE BILATERAL     Patient Active Problem List   Diagnosis Date Noted   NSTEMI (non-ST elevated myocardial infarction) (HCC) 10/13/2022   HFrEF (heart failure with reduced ejection fraction) (HCC) 10/13/2022   Degenerative joint disease (DJD) of lumbar spine 10/12/2022   Frequent falls 05/15/2022   Unsteady gait 04/16/2022   Acute delirium 04/13/2022   Electrolyte abnormality 04/13/2022   Nausea & vomiting 04/13/2022   PAD (peripheral artery disease) (HCC) 09/03/2021   Localized, primary osteoarthritis 11/24/2020   Wound healing, delayed 09/08/2020   History of lumbar surgery 09/08/2020   Chronic ischemic heart disease 07/13/2020   History  of MI (myocardial infarction) 07/13/2020   History of PTCA 1 07/13/2020   Other personal history presenting hazards to health 07/13/2020   Pure hypercholesterolemia 07/13/2020   Elevated lactic acid level 04/19/2020   Generalized weakness 04/09/2020   Fall at home 04/09/2020   Gastroenteritis 04/09/2020   AMS (altered mental status) 04/08/2020   Hypoxia 02/01/2020   Atrial fibrillation status post cardioversion (HCC) 12/22/2019   Congestive heart failure (CHF) (HCC) 12/13/2019   Hyponatremia 12/06/2019   Acute postoperative anemia due to expected blood loss 05/28/2019   Hyperlipidemia  05/28/2019   Ischemic cardiomyopathy 05/28/2019   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   Unstable angina (HCC) 05/24/2019   Chest pain 05/21/2019   Ischemic chest pain (HCC) 05/16/2019   CAD (coronary artery disease) 05/16/2019   HTN (hypertension) 05/16/2019   Depression 05/16/2019   GERD (gastroesophageal reflux disease) 05/16/2019   Verruca plantaris 01/23/2018   Obstructive sleep apnea syndrome 01/20/2018   Major depressive disorder, single episode, moderate (HCC) 07/05/2016   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 Zada Finders, MD  REFERRING PROVIDER: Dr. Zada Finders, MD/ [name], [name]  REFERRING DIAG:  R26.9 (ICD-10-CM) - Unspecified abnormalities of gait and mobility  R26.89 (ICD-10-CM) - Other abnormalities of gait and mobility  R29.6 (ICD-10-CM) - Repeated falls    THERAPY DIAG:  Muscle weakness (generalized)  Balance problem  Rationale for Evaluation and Treatment: Rehabilitation  ONSET DATE: at least 2 years  SUBJECTIVE: (From initial evaluation note)  SUBJECTIVE STATEMENT: Pt reports he is having difficulty with gait and balance.  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   He feels like his balance and walking difficulty have worsened recently.  He reports he started falling frequently, he was catching his foot on things and tripping.  The most recent major fall was about 6 months ago.  He is being careful since then and trying to walk more to get stronger and trying to be more aware of his gait pattern.  He was walking with a walker until ~4 months ago, then started using a cane, and  then has tried walking without any assistance.      He feels like his difficulty walking limits his ability to get out of the house as much as he used to because he is fearful of falling.  PLOF- he enjoys getting out of the house for social activities, going out to breakfast too; going grocery shopping a few times/week to get his groceries; prior to his back surgery he walked at the park in [place], hasn't been back though because he is worried about falling though.  He has a home aide that comes a few days a week to assist with household tasks as needed.  Also reports he had a MI a little over a week ago and was in the hospital (8/3-10/16/22)- he states he is referred to begin cardiac rehab as part of his outpatient recovery.  PERTINENT HISTORY: Extensive cardiac history History of lumbar spine surgery- 2022 L4-5 microdiscectomy/laminectomy   PAIN:  Are you having pain? R groin muscle area where he had the cardiac catheterization procedure last week.  PRECAUTIONS: Fall  RED FLAGS: Pt with recent MI (10/13/22) with stent placement (see chart review)  WEIGHT BEARING RESTRICTIONS: No  FALLS:  Has patient fallen in last 6 months? Yes. Number of falls multiple  LIVING ENVIRONMENT: Lives with: lives alone (has a self reported "lady friend" for social support and enjoys being able to get out the house for community activities, going to restaurants together) Lives in: House/apartment Stairs:  he lives in a single level home with bonus room over the garage, but he does not need to go upstairs.1 step to enter in the garage- able to get in without falling. Has following equipment at home: Single point cane, Walker - 4 wheeled, and Wheelchair (manual) Has a home health aide that comes 2-3x/week if he needs assistance  OCCUPATION: retired  PLOF: Independent  PATIENT GOALS: to address drop foot and be able to manage it better; to strengthen his legs; and to increase his breathing; and improve his  balance   NEXT MD VISIT: yes, next week   OBJECTIVE:   DIAGNOSTIC FINDINGS: no recent lower extremity/spine imaging  PATIENT SURVEYS:  FOTO 39/52  COGNITION: Overall cognitive status: Within functional limits for tasks assessed     SENSATION: Pt reports reduced sensation to light touch L L4-5 dermatomes  EDEMA:  No pitting edema noted on dorsum of foot   POSTURE: pt stands with wide base of support for balance; uses UE support on LE's for sit to stand transfer  PALPATION: (-) TTP distal LE b/l  LOWER EXTREMITY ROM:  Active ROM Right eval Left eval  Hip flexion WNL WNL  Hip extension    Hip abduction    Hip adduction    Hip internal rotation    Hip external rotation    Knee flexion 105 105  Knee extension -5 -5  Ankle dorsiflexion 5 deg PROM 0 deg  Ankle plantarflexion    Great toe extension 45 deg PROM 40 deg   Ankle eversion     (Blank rows = not tested)  LOWER EXTREMITY MMT:  MMT Right eval Left eval  Hip flexion 4 4  Hip extension    Hip abduction    Hip adduction    Hip internal rotation    Hip external rotation    Knee flexion 5 5  Knee extension 4 4  Ankle dorsiflexion 4/5 1/5  Ankle plantarflexion 3/5 3/5  Great toe extension 4/5 2/5  Ankle eversion 4/5 3/5   (Blank rows = not tested)   FUNCTIONAL TESTS:  OUTCOME MEASURES: TEST Outcome Interpretation  5 times sit<>stand 21 seconds >60 yo, >15 sec indicates increased risk for falls  10 meter walk test deferred <1.0 m/s indicates increased risk for falls; limited community ambulator  [employer] Assessment /56   deferred <36/56 (100% risk for falls), 37-45 (80% risk for falls); 46-51 (>50% risk for falls); 52-55 (lower risk <25% of falls)      GAIT: Distance walked: from waiting area into PT gym 50 ft, PT supervision Assistive device utilized: None Level of assistance: Complete Independence Comments: Pt ambulates with decreased gait speed, wide BOS, decreased stride length, excess L  hip flexion strategy to compensate for L foot drop  Pt not able to stand SLS on L without UE support today; able to stand R LE x 5 seconds  TODAY'S TREATMENT:  DATE: 04/01/23 Subjective: Pt went to the gym and walked on TM this weekend.    Pain: 0/10  Objective:  Therapeutic Exercises:  While wearing L AFO, no SPC during session, gait belt and PT supervision during standing exercises  Nustep x 10 min, level 3-4 for knee mobility/LE strengthening at start of session, PT obtained hx/subjective portion of session today during this too- not today  Knee flexion stretch with foot on step: R and L 5x ea, x15-20 seconds ea  Front step ups: L LE x10, R LE x10  Ascend/descend stairs 3x (4 steps) with b/l railing, using step to gait pattern  Split squat: in parallel bars, L foot in front 3 sets to fatigue (12-15 reps), R foot in front 3 sets to fatigue (6-7 reps)- not today  Squats: holding 5 lb medicine ball 3x8  Hurdles: tall/short alternating, forward direction in a circular CW and CCW direction, 3 laps, PT SBA and occasionally CGA for tall hurdles  Forward walking, reverse walking, side stepping, turning 180 degrees with gait direction change on flat surface in hallway- 4 laps, good balance with this today  Dynamic balance activity for multidirectional stepping, motor planning/sequencing, short term memory recall, PT verbal/visual/demonstration cues a SBA with gait belt needed for sequencing- rumba box (forward, L, reverse, R), side stepping R/L x2 ea, grape vine R/L, feet together weight shifts- practiced 8x today at 2x during session  Squat to pick up cones from floor 15x   Not today: Airex: Ball toss at trampoline, standing on airex (5 lb) 3x10 with PT SBA- to practice reaction to external stimuli/balance training Airex: feet together look up/down and R/L 1 min  ea- not today  Airex: feet together and PT manual perturbations from various directions at trunk/pelvis- not today    PATIENT EDUCATION:  Education details: PT POC/goals, importance of attending cardiac rehab, activity pacing Person educated: Patient Education method: Explanation Education comprehension: verbalized understanding and needs further education  HOME EXERCISE PROGRAM: Access Code: [email or web address] URL: [email or web address] Date: 10/24/2022 Prepared by: Max Fickle  Exercises - Seated Heel Raise  - 1 x daily - 3 x weekly - 2 sets - 20 reps - Seated March  - 1 x daily - 3 x weekly - 2 sets - 10 reps - Seated Long Arc Quad  - 1 x daily - 3 x weekly - 2 sets - 10 reps  ASSESSMENT:  CLINICAL IMPRESSION: Pt demonstrated ability to ascend/descend the stairs in clinic today with the best control/strength I have observed during this course of PT.  This is significant progress as ascending/descending the stairs in his home is one pt's goals for PT.  Use of hand rail is required for safety, but he demonstrated sufficient strength to navigate a flight of stairs during session today.  Will assess again at next visit.  Tolerated LE strengthening and balance exercises well.  He demonstrated good balance during ambulation in various directions at end of session today.  Likely has not met maximum recovery with skilled PT.  I did discuss a goal for transitioning to a regular strengthening program at the gym to continue exercising long term when he completes this course of PT.  Anticipate transition to HEP/DC within next few weeks.   OBJECTIVE IMPAIRMENTS: Abnormal gait, cardiopulmonary status limiting activity, decreased activity tolerance, decreased balance, decreased mobility, difficulty walking, decreased ROM, and decreased strength.   ACTIVITY LIMITATIONS: standing, squatting, stairs, transfers, and locomotion level  PARTICIPATION LIMITATIONS: meal prep, cleaning, laundry,  interpersonal relationship, shopping,  and community activity  PERSONAL FACTORS: Age, Past/current experiences, Time since onset of injury/illness/exacerbation, and cardiovascular hx including recent MI with stent placement last week  are also affecting patient's functional outcome.   REHAB POTENTIAL: Good  CLINICAL DECISION MAKING: Stable/uncomplicated  EVALUATION COMPLEXITY: Low   GOALS: Goals reviewed with patient? Yes  SHORT TERM GOALS: Target date: 11/11/22 Pt will be able to perform a HEP for LE strengthening >3x/week Baseline: 11/27/22 pt has been instructed on a HEP; 12/24/22: met Goal status: Met   LONG TERM GOALS: Target date: 04/08/23  Improve FOTO to >51 indicating pt able to perform his daily activities without being limited by his balance; 12/24/22: in progress Baseline: 39; 10/7: 54; 01/21/23 51; 02/18/23: in progress; 03/18/23: 51 Goal status: In Progress  2.  Pt will be able to amb with typical width BOS and b/l heel strike gait pattern using least restrictive AD and AFO on flat/incline/decline/grass surfaces x 10 min Baseline: not using any AD or AFO in clinic, pt wide BOS, no L heel strike due to foot drop; 11/27/22 pt is amb with SPC now, discussions continue regarding AFO/brace for improved safety; 12/24/22: pt now wearing AFO and using SPC on uneven surfaces with PT SBA x 5 minutes; 01/21/23: in progress- able to amb intervals of 5-6 min x 2 with 1-2 min sitting break between; 03/18/23: using SPC for >5 min on incline up driveway/neighborhood, he would like to be able to do without SPC again as this was PLOF Goal status: In progress  3.  Pt will be able to perform 5x STS in <15 sec indicating reduced risk for falling Baseline: 21 sec, 11/27/22 19 sec (with mild UE use); 12/28/22: 18 seconds  (with mild UE support) 01/21/23:  19 seconds without UE support; 02/18/23 17 seconds without UE support; 03/18/23: 19 seconds without UE support Goal status: In Progress  4. Pt will be  able to amb x 1 block distance to walk his dog 1x/day.  Baseline: 1/4-1/2 block (pt self report), 01/21/23: amb 1 loop around building in 5.25 minutes at clinic; 03/18/23- did not assess due to inclement weather  Goal status: In progress  5. Improve 10 M walk test to >1.0 m/s to promote reduced fall risk and improved community ambulator status  Baseline: 12/24/22 .9 m/s; 01/21/23: .8 m/s (at end of session, was fatigued)  Goal status: In progress   6. Pt will independently be able to transfer stand to floor and floor to standing with use of chair/sturdy surface to simulate being able to get up from floor and to reduce fear/risk of falling  Baseline: pt has not tried and expresses fear of not being able to get up from floor living alone; 03/18/23: see above objective section of note; in progress  Goal status: In progress 7. Pt will be able to ascend 1 flight of stairs in his home 2x/day to be able to access second floor of his home independently as he lives at home alone  Baseline: 03/18/23 pt is not doing, fear of falling, and notes feeling "weak"   Goal status: new   8. Pt will transition to independent exercise program 2-3x/week for strengthening at his local [name] after completing this course of PT    Baseline: 03/18/23 pt states he will reinstate his membership this week   Goal status: new   PLAN:  PT FREQUENCY: 1-2x/week  PT DURATION: 3 weeks  PLANNED INTERVENTIONS: Therapeutic exercises, Therapeutic activity, Neuromuscular re-education, Balance training, Gait training, Patient/Family  education, Self Care, Joint mobilization, and Orthotic/Fit training  PLAN FOR NEXT SESSION: continue with proximal hip and LE strengthening, multidirectional movements/dynamic balance activities, improving confidence/reducing fear of falling through activities in small spaces and that require increased reaction speed to unpredictable external stimuli; fall recovery/floor transfers training; updated PT  and pt goals today; plan to transition to independent strengthening within the next month  Max Fickle, PT, DPT, OCS  Physical Therapist - [facility]  [name]   Ardine Bjork, PT  2:19 PM

## 2023-04-03 ENCOUNTER — Ambulatory Visit: Payer: Medicare HMO

## 2023-04-03 DIAGNOSIS — M6281 Muscle weakness (generalized): Secondary | ICD-10-CM | POA: Diagnosis not present

## 2023-04-03 DIAGNOSIS — R2689 Other abnormalities of gait and mobility: Secondary | ICD-10-CM

## 2023-04-03 NOTE — Therapy (Signed)
OUTPATIENT PHYSICAL THERAPY LOWER EXTREMITY TREATMENT/PROGRESS NOTE/ Re-certification through 04/08/23    Patient Name: Devin [name]. MRN: [id number] DOB:January 24, 1946, 78 y.o., male Today's Date: 02/13/23  END OF SESSION:  PT End of Session - 04/03/23 1157     Visit Number 38    Number of Visits 42    Date for PT Re-Evaluation 04/08/23    Authorization Type re-cert/PN done today x 3 weeks (6 additional visits)- planning to transition to HEP 04/08/23    PT Start Time 1159    PT Stop Time 1243    PT Time Calculation (min) 44 min    Equipment Utilized During Treatment Gait belt    Activity Tolerance Patient tolerated treatment well    Behavior During Therapy WFL for tasks assessed/performed                  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   Hypertension    Ischemic cardiomyopathy    MI, old    Peripheral neuropathy    PFO (patent foramen ovale) 05/2019   s/p repair   Sleep apnea    Spinal stenosis of lumbar region    TIA (transient ischemic attack)    Past Surgical History:  Procedure Laterality Date   APPENDECTOMY     BREAST SURGERY Right    benign mass   CARDIOVERSION Right 12/15/2019   Procedure: CARDIOVERSION;  Surgeon: Laurier Nancy, MD;  Location: [facility] ORS;  Service: Cardiovascular;  Laterality: Right;   CATARACT EXTRACTION, BILATERAL     COLONOSCOPY     CORONARY ANGIOPLASTY WITH STENT PLACEMENT     CORONARY ARTERY BYPASS GRAFT  05/2019   LIMA-LAD   CORONARY STENT INTERVENTION N/A 10/15/2022   Procedure: CORONARY STENT INTERVENTION;  Surgeon: Marcina Millard, MD;  Location: [facility]  INVASIVE CV LAB;  Service: Cardiovascular;  Laterality: N/A;   LEFT HEART CATH N/A 10/15/2022   Procedure: Left Heart Cath;  Surgeon: Marcina Millard, MD;  Location: [facility] INVASIVE CV LAB;  Service: Cardiovascular;  Laterality: N/A;   LEFT HEART CATH AND CORS/GRAFTS ANGIOGRAPHY N/A 05/21/2019   Procedure: LEFT HEART CATH AND CORONARY ANGIOGRAPHY;  Surgeon: Laurier Nancy, MD;  Location: [facility] INVASIVE CV LAB;  Service: Cardiovascular;  Laterality: N/A;   LUMBAR LAMINECTOMY/DECOMPRESSION MICRODISCECTOMY N/A 07/04/2020   Procedure: L4-5 LAMINECTOMY;  Surgeon: Lucy Chris, MD;  Location: [facility] ORS;  Service: Neurosurgery;  Laterality: N/A;   REPLACEMENT TOTAL KNEE BILATERAL     Patient Active Problem List   Diagnosis Date Noted   NSTEMI (non-ST elevated myocardial infarction) (HCC) 10/13/2022   HFrEF (heart failure with reduced ejection fraction) (HCC) 10/13/2022   Degenerative joint disease (DJD) of lumbar spine 10/12/2022   Frequent falls 05/15/2022   Unsteady gait 04/16/2022   Acute delirium 04/13/2022   Electrolyte abnormality 04/13/2022   Nausea & vomiting 04/13/2022   PAD (peripheral artery disease) (HCC) 09/03/2021   Localized, primary osteoarthritis 11/24/2020   Wound healing, delayed 09/08/2020   History of lumbar surgery 09/08/2020   Chronic ischemic heart disease 07/13/2020   History  of MI (myocardial infarction) 07/13/2020   History of PTCA 1 07/13/2020   Other personal history presenting hazards to health 07/13/2020   Pure hypercholesterolemia 07/13/2020   Elevated lactic acid level 04/19/2020   Generalized weakness 04/09/2020   Fall at home 04/09/2020   Gastroenteritis 04/09/2020   AMS (altered mental status) 04/08/2020   Hypoxia 02/01/2020   Atrial fibrillation status post cardioversion (HCC) 12/22/2019   Congestive heart failure (CHF) (HCC) 12/13/2019   Hyponatremia 12/06/2019   Acute postoperative anemia due to expected blood loss 05/28/2019   Hyperlipidemia  05/28/2019   Ischemic cardiomyopathy 05/28/2019   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   Unstable angina (HCC) 05/24/2019   Chest pain 05/21/2019   Ischemic chest pain (HCC) 05/16/2019   CAD (coronary artery disease) 05/16/2019   HTN (hypertension) 05/16/2019   Depression 05/16/2019   GERD (gastroesophageal reflux disease) 05/16/2019   Verruca plantaris 01/23/2018   Obstructive sleep apnea syndrome 01/20/2018   Major depressive disorder, single episode, moderate (HCC) 07/05/2016   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 Zada Finders, MD  REFERRING PROVIDER: Dr. Zada Finders, MD/ [name], [facility]  REFERRING DIAG:  R26.9 (ICD-10-CM) - Unspecified abnormalities of gait and mobility  R26.89 (ICD-10-CM) - Other abnormalities of gait and mobility  R29.6 (ICD-10-CM) - Repeated falls    THERAPY DIAG:  Muscle weakness (generalized)  Balance problem  Rationale for Evaluation and Treatment: Rehabilitation  ONSET DATE: at least 2 years  SUBJECTIVE: (From initial evaluation note)  SUBJECTIVE STATEMENT: Pt reports he is having difficulty with gait and balance.  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   He feels like his balance and walking difficulty have worsened recently.  He reports he started falling frequently, he was catching his foot on things and tripping.  The most recent major fall was about 6 months ago.  He is being careful since then and trying to walk more to get stronger and trying to be more aware of his gait pattern.  He was walking with a walker until ~4 months ago, then started using a cane, and  then has tried walking without any assistance.      He feels like his difficulty walking limits his ability to get out of the house as much as he used to because he is fearful of falling.  PLOF- he enjoys getting out of the house for social activities, going out to breakfast too; going grocery shopping a few times/week to get his groceries; prior to his back surgery he walked at the park in [place], hasn't been back though because he is worried about falling though.  He has a home aide that comes a few days a week to assist with household tasks as needed.  Also reports he had a MI a little over a week ago and was in the hospital (8/3-10/16/22)- he states he is referred to begin cardiac rehab as part of his outpatient recovery.  PERTINENT HISTORY: Extensive cardiac history History of lumbar spine surgery- 2022 L4-5 microdiscectomy/laminectomy   PAIN:  Are you having pain? R groin muscle area where he had the cardiac catheterization procedure last week.  PRECAUTIONS: Fall  RED FLAGS: Pt with recent MI (10/13/22) with stent placement (see chart review)  WEIGHT BEARING RESTRICTIONS: No  FALLS:  Has patient fallen in last 6 months? Yes. Number of falls multiple  LIVING ENVIRONMENT: Lives with: lives alone (has a self reported "lady friend" for social support and enjoys being able to get out the house for community activities, going to restaurants together) Lives in: House/apartment Stairs:  he lives in a single level home with bonus room over the garage, but he does not need to go upstairs.1 step to enter in the garage- able to get in without falling. Has following equipment at home: Single point cane, Walker - 4 wheeled, and Wheelchair (manual) Has a home health aide that comes 2-3x/week if he needs assistance  OCCUPATION: retired  PLOF: Independent  PATIENT GOALS: to address drop foot and be able to manage it better; to strengthen his legs; and to increase his breathing; and improve his  balance   NEXT MD VISIT: yes, next week   OBJECTIVE:   DIAGNOSTIC FINDINGS: no recent lower extremity/spine imaging  PATIENT SURVEYS:  FOTO 39/52  COGNITION: Overall cognitive status: Within functional limits for tasks assessed     SENSATION: Pt reports reduced sensation to light touch L L4-5 dermatomes  EDEMA:  No pitting edema noted on dorsum of foot   POSTURE: pt stands with wide base of support for balance; uses UE support on LE's for sit to stand transfer  PALPATION: (-) TTP distal LE b/l  LOWER EXTREMITY ROM:  Active ROM Right eval Left eval  Hip flexion WNL WNL  Hip extension    Hip abduction    Hip adduction    Hip internal rotation    Hip external rotation    Knee flexion 105 105  Knee extension -5 -5  Ankle dorsiflexion 5 deg PROM 0 deg  Ankle plantarflexion    Great toe extension 45 deg PROM 40 deg   Ankle eversion     (Blank rows = not tested)  LOWER EXTREMITY MMT:  MMT Right eval Left eval  Hip flexion 4 4  Hip extension    Hip abduction    Hip adduction    Hip internal rotation    Hip external rotation    Knee flexion 5 5  Knee extension 4 4  Ankle dorsiflexion 4/5 1/5  Ankle plantarflexion 3/5 3/5  Great toe extension 4/5 2/5  Ankle eversion 4/5 3/5   (Blank rows = not tested)   FUNCTIONAL TESTS:  OUTCOME MEASURES: TEST Outcome Interpretation  5 times sit<>stand 21 seconds >60 yo, >15 sec indicates increased risk for falls  10 meter walk test deferred <1.0 m/s indicates increased risk for falls; limited community ambulator  [employer] Assessment /56   deferred <36/56 (100% risk for falls), 37-45 (80% risk for falls); 46-51 (>50% risk for falls); 52-55 (lower risk <25% of falls)      GAIT: Distance walked: from waiting area into PT gym 50 ft, PT supervision Assistive device utilized: None Level of assistance: Complete Independence Comments: Pt ambulates with decreased gait speed, wide BOS, decreased stride length, excess L  hip flexion strategy to compensate for L foot drop  Pt not able to stand SLS on L without UE support today; able to stand R LE x 5 seconds  TODAY'S TREATMENT:  DATE: 04/03/23 Subjective: Pt reports he walked on the TM again at the gym yesterday @ 1.5 mph; he overall is feeling much stronger and more steady now than he was before PT  Pain: 0/10  Objective:  Therapeutic Exercises:  While wearing L AFO, no SPC during session, gait belt and PT supervision during standing exercises  Front step ups: L LE x10, R LE x10  Ascend/descend stairs 3x (4 steps) with b/l railing, using step to gait pattern  Squats: holding 7 lb medicine ball 2x10 (progressed weight today), cues for hips "back over the chair"  Airex: feet together and PT manual perturbations from various directions at trunk/pelvis x 2 minutes- no loss of balance today  Standing on 1/2 foam roller for ankle strategy A/P emphasis- 20 second intervals x 6- PT supervision with gait belt  Forward walking, reverse walking, side stepping, turning 180 degrees with gait direction change on flat surface in hallway- 4 laps, good balance with this today, no loss of balance in the posterior direction  Nustep x 10 min, level 3-4 for knee mobility/LE strengthening at end of session to focus on LE endurance work after walking in hallway  Pt education regarding gym equipment and what would be appropriate machines to use at his gym x 6 minutes   Not today: Airex: Ball toss at trampoline, standing on airex (5 lb) 3x10 with PT SBA- to practice reaction to external stimuli/balance training Airex: feet together look up/down and R/L 1 min ea- not today  Hurdles: tall/short alternating, forward direction in a circular CW and CCW direction, 3 laps, PT SBA and occasionally CGA for tall hurdles  Dynamic balance activity for  multidirectional stepping, motor planning/sequencing, short term memory recall, PT verbal/visual/demonstration cues a SBA with gait belt needed for sequencing- rumba box (forward, L, reverse, R), side stepping R/L x2 ea, grape vine R/L, feet together weight shifts- practiced 8x today at 2x during session  Squat to pick up cones from floor 15x   PATIENT EDUCATION:  Education details: PT POC/goals, importance of attending cardiac rehab, activity pacing Person educated: Patient Education method: Explanation Education comprehension: verbalized understanding and needs further education  HOME EXERCISE PROGRAM: Access Code: [email or web address] URL: [email or web address] Date: 10/24/2022 Prepared by: Max Fickle  Exercises - Seated Heel Raise  - 1 x daily - 3 x weekly - 2 sets - 20 reps - Seated March  - 1 x daily - 3 x weekly - 2 sets - 10 reps - Seated Long Arc Quad  - 1 x daily - 3 x weekly - 2 sets - 10 reps  ASSESSMENT:  CLINICAL IMPRESSION: Pt able to complete therapeutic exercises today without any significant loss of balance.  Able to use ankle/hip/stepping strategies to regain balance as needed during session.  Able to respond and balance with external perturbation forces to trunk. He is steady while amb in forward/lateral/and reverse directions on flat surfaces.  Will perform re-assessment of goals and objective measures and likely transition to independent HEP at next visit.  OBJECTIVE IMPAIRMENTS: Abnormal gait, cardiopulmonary status limiting activity, decreased activity tolerance, decreased balance, decreased mobility, difficulty walking, decreased ROM, and decreased strength.   ACTIVITY LIMITATIONS: standing, squatting, stairs, transfers, and locomotion level  PARTICIPATION LIMITATIONS: meal prep, cleaning, laundry, interpersonal relationship, shopping, and community activity  PERSONAL FACTORS: Age, Past/current experiences, Time since onset of  injury/illness/exacerbation, and cardiovascular hx including recent MI with stent placement last week  are also affecting patient's functional outcome.   REHAB POTENTIAL: Good  CLINICAL DECISION MAKING: Stable/uncomplicated  EVALUATION COMPLEXITY: Low   GOALS: Goals reviewed with patient? Yes  SHORT TERM GOALS: Target date: 11/11/22 Pt will be able to perform a HEP for LE strengthening >3x/week Baseline: 11/27/22 pt has been instructed on a HEP; 12/24/22: met Goal status: Met   LONG TERM GOALS: Target date: 04/08/23  Improve FOTO to >51 indicating pt able to perform his daily activities without being limited by his balance; 12/24/22: in progress Baseline: 39; 10/7: 54; 01/21/23 51; 02/18/23: in progress; 03/18/23: 51 Goal status: In Progress  2.  Pt will be able to amb with typical width BOS and b/l heel strike gait pattern using least restrictive AD and AFO on flat/incline/decline/grass surfaces x 10 min Baseline: not using any AD or AFO in clinic, pt wide BOS, no L heel strike due to foot drop; 11/27/22 pt is amb with SPC now, discussions continue regarding AFO/brace for improved safety; 12/24/22: pt now wearing AFO and using SPC on uneven surfaces with PT SBA x 5 minutes; 01/21/23: in progress- able to amb intervals of 5-6 min x 2 with 1-2 min sitting break between; 03/18/23: using SPC for >5 min on incline up driveway/neighborhood, he would like to be able to do without SPC again as this was PLOF Goal status: In progress  3.  Pt will be able to perform 5x STS in <15 sec indicating reduced risk for falling Baseline: 21 sec, 11/27/22 19 sec (with mild UE use); 12/28/22: 18 seconds  (with mild UE support) 01/21/23:  19 seconds without UE support; 02/18/23 17 seconds without UE support; 03/18/23: 19 seconds without UE support Goal status: In Progress  4. Pt will be able to amb x 1 block distance to walk his dog 1x/day.  Baseline: 1/4-1/2 block (pt self report), 01/21/23: amb 1 loop around building  in 5.25 minutes at clinic; 03/18/23- did not assess due to inclement weather  Goal status: In progress  5. Improve 10 M walk test to >1.0 m/s to promote reduced fall risk and improved community ambulator status  Baseline: 12/24/22 .9 m/s; 01/21/23: .8 m/s (at end of session, was fatigued)  Goal status: In progress   6. Pt will independently be able to transfer stand to floor and floor to standing with use of chair/sturdy surface to simulate being able to get up from floor and to reduce fear/risk of falling  Baseline: pt has not tried and expresses fear of not being able to get up from floor living alone; 03/18/23: see above objective section of note; in progress  Goal status: In progress 7. Pt will be able to ascend 1 flight of stairs in his home 2x/day to be able to access second floor of his home independently as he lives at home alone  Baseline: 03/18/23 pt is not doing, fear of falling, and notes feeling "weak"   Goal status: new   8. Pt will transition to independent exercise program 2-3x/week for strengthening at his local fitness center after completing this course of PT    Baseline: 03/18/23 pt states he will reinstate his membership this week   Goal status: new   PLAN:  PT FREQUENCY: 1-2x/week  PT DURATION: 3 weeks  PLANNED INTERVENTIONS: Therapeutic exercises, Therapeutic activity, Neuromuscular re-education, Balance training, Gait training, Patient/Family education, Self Care, Joint mobilization, and Orthotic/Fit training  PLAN FOR NEXT SESSION: establish a realistic long term HEP/strengthening plan and what machines would be appropriate to use at his gym; plan to transition to independent strengthening at  the gym next week  Max Fickle, PT, DPT, OCS  Physical Therapist - [facility]  [name]   Ardine Bjork, PT  11:58 AM

## 2023-04-04 ENCOUNTER — Encounter: Payer: Medicare HMO | Admitting: *Deleted

## 2023-04-04 DIAGNOSIS — Z48812 Encounter for surgical aftercare following surgery on the circulatory system: Secondary | ICD-10-CM | POA: Diagnosis not present

## 2023-04-04 DIAGNOSIS — I214 Non-ST elevation (NSTEMI) myocardial infarction: Secondary | ICD-10-CM

## 2023-04-04 DIAGNOSIS — Z955 Presence of coronary angioplasty implant and graft: Secondary | ICD-10-CM

## 2023-04-04 NOTE — Progress Notes (Signed)
Daily Session Note  Patient Details  Name: Devin [name] [facility]. MRN: [id number] Date of Birth: Apr 02, 1945 Referring Provider:   Flowsheet Row Cardiac Rehab from 11/19/2022 in [facility] Cardiac and Pulmonary Rehab  Referring Provider Dr. Adrian Blackwater MD       Encounter Date: 04/04/2023  Check In:  Session Check In - 04/04/23 1532       Check-In   Supervising physician immediately available to respond to emergencies See telemetry face sheet for immediately available ER MD    Location [facility]-Cardiac & Pulmonary Rehab    Staff Present Maxon Conetta BS, Exercise Physiologist;Noah Tickle, BS, Exercise Physiologist;Aleyza Salmi Jewel Baize RN,BSN    Virtual Visit No    Medication changes reported     No    Fall or balance concerns reported    No    Warm-up and Cool-down Performed on first and last piece of equipment    Resistance Training Performed Yes    VAD Patient? No    PAD/SET Patient? No      Pain Assessment   Currently in Pain? No/denies                Social History   Tobacco Use  Smoking Status Former  Smokeless Tobacco Never  Tobacco Comments   Quit over 40 years ago    Goals Met:  Independence with exercise equipment Exercise tolerated well No report of concerns or symptoms today Strength training completed today  Goals Unmet:  Not Applicable  Comments: Pt able to follow exercise prescription today without complaint.  Will continue to monitor for progression.    Dr. Bethann Punches is Medical Director for [facility] Cardiac Rehabilitation.  Dr. Vida Rigger is Medical Director for [facility] Pulmonary Rehabilitation.

## 2023-04-08 ENCOUNTER — Ambulatory Visit: Payer: Medicare HMO

## 2023-04-08 DIAGNOSIS — R2689 Other abnormalities of gait and mobility: Secondary | ICD-10-CM

## 2023-04-08 DIAGNOSIS — M6281 Muscle weakness (generalized): Secondary | ICD-10-CM | POA: Diagnosis not present

## 2023-04-08 NOTE — Therapy (Signed)
OUTPATIENT PHYSICAL THERAPY LOWER EXTREMITY TREATMENT/PROGRESS NOTE/ Re-certification through 04/08/23    Patient Name: Devin [name] [facility]. MRN: [id number] DOB:1945/08/14, 78 y.o., male Today's Date: 02/13/23  END OF SESSION:  PT End of Session - 04/08/23 1202     Visit Number 39    Number of Visits 42    Date for PT Re-Evaluation 04/08/23    Authorization Type re-cert/PN done today x 3 weeks (6 additional visits)- planning to transition to HEP 04/08/23    PT Start Time 1200    PT Stop Time 1245    PT Time Calculation (min) 45 min    Equipment Utilized During Treatment Gait belt    Activity Tolerance Patient tolerated treatment well    Behavior During Therapy WFL for tasks assessed/performed                  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   Hypertension    Ischemic cardiomyopathy    MI, old    Peripheral neuropathy    PFO (patent foramen ovale) 05/2019   s/p repair   Sleep apnea    Spinal stenosis of lumbar region    TIA (transient ischemic attack)    Past Surgical History:  Procedure Laterality Date   APPENDECTOMY     BREAST SURGERY Right    benign mass   CARDIOVERSION Right 12/15/2019   Procedure: CARDIOVERSION;  Surgeon: Laurier Nancy, MD;  Location: [facility] ORS;  Service: Cardiovascular;  Laterality: Right;   CATARACT EXTRACTION, BILATERAL     COLONOSCOPY     CORONARY ANGIOPLASTY WITH STENT PLACEMENT     CORONARY ARTERY BYPASS GRAFT  05/2019   LIMA-LAD   CORONARY STENT INTERVENTION N/A 10/15/2022   Procedure: CORONARY STENT INTERVENTION;  Surgeon: Marcina Millard, MD;  Location: [facility]  INVASIVE CV LAB;  Service: Cardiovascular;  Laterality: N/A;   LEFT HEART CATH N/A 10/15/2022   Procedure: Left Heart Cath;  Surgeon: Marcina Millard, MD;  Location: [facility] INVASIVE CV LAB;  Service: Cardiovascular;  Laterality: N/A;   LEFT HEART CATH AND CORS/GRAFTS ANGIOGRAPHY N/A 05/21/2019   Procedure: LEFT HEART CATH AND CORONARY ANGIOGRAPHY;  Surgeon: Laurier Nancy, MD;  Location: [facility] INVASIVE CV LAB;  Service: Cardiovascular;  Laterality: N/A;   LUMBAR LAMINECTOMY/DECOMPRESSION MICRODISCECTOMY N/A 07/04/2020   Procedure: L4-5 LAMINECTOMY;  Surgeon: Lucy Chris, MD;  Location: [facility] ORS;  Service: Neurosurgery;  Laterality: N/A;   REPLACEMENT TOTAL KNEE BILATERAL     Patient Active Problem List   Diagnosis Date Noted   NSTEMI (non-ST elevated myocardial infarction) (HCC) 10/13/2022   HFrEF (heart failure with reduced ejection fraction) (HCC) 10/13/2022   Degenerative joint disease (DJD) of lumbar spine 10/12/2022   Frequent falls 05/15/2022   Unsteady gait 04/16/2022   Acute delirium 04/13/2022   Electrolyte abnormality 04/13/2022   Nausea & vomiting 04/13/2022   PAD (peripheral artery disease) (HCC) 09/03/2021   Localized, primary osteoarthritis 11/24/2020   Wound healing, delayed 09/08/2020   History of lumbar surgery 09/08/2020   Chronic ischemic heart disease 07/13/2020   History  of MI (myocardial infarction) 07/13/2020   History of PTCA 1 07/13/2020   Other personal history presenting hazards to health 07/13/2020   Pure hypercholesterolemia 07/13/2020   Elevated lactic acid level 04/19/2020   Generalized weakness 04/09/2020   Fall at home 04/09/2020   Gastroenteritis 04/09/2020   AMS (altered mental status) 04/08/2020   Hypoxia 02/01/2020   Atrial fibrillation status post cardioversion (HCC) 12/22/2019   Congestive heart failure (CHF) (HCC) 12/13/2019   Hyponatremia 12/06/2019   Acute postoperative anemia due to expected blood loss 05/28/2019   Hyperlipidemia  05/28/2019   Ischemic cardiomyopathy 05/28/2019   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   Unstable angina (HCC) 05/24/2019   Chest pain 05/21/2019   Ischemic chest pain (HCC) 05/16/2019   CAD (coronary artery disease) 05/16/2019   HTN (hypertension) 05/16/2019   Depression 05/16/2019   GERD (gastroesophageal reflux disease) 05/16/2019   Verruca plantaris 01/23/2018   Obstructive sleep apnea syndrome 01/20/2018   Major depressive disorder, single episode, moderate (HCC) 07/05/2016   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 Zada Finders, MD  REFERRING PROVIDER: Dr. Zada Finders, MD/ [name], [facility]  REFERRING DIAG:  R26.9 (ICD-10-CM) - Unspecified abnormalities of gait and mobility  R26.89 (ICD-10-CM) - Other abnormalities of gait and mobility  R29.6 (ICD-10-CM) - Repeated falls    THERAPY DIAG:  Muscle weakness (generalized)  Balance problem  Rationale for Evaluation and Treatment: Rehabilitation  ONSET DATE: at least 2 years  SUBJECTIVE: (From initial evaluation note)  SUBJECTIVE STATEMENT: Pt reports he is having difficulty with gait and balance.  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   He feels like his balance and walking difficulty have worsened recently.  He reports he started falling frequently, he was catching his foot on things and tripping.  The most recent major fall was about 6 months ago.  He is being careful since then and trying to walk more to get stronger and trying to be more aware of his gait pattern.  He was walking with a walker until ~4 months ago, then started using a cane, and  then has tried walking without any assistance.      He feels like his difficulty walking limits his ability to get out of the house as much as he used to because he is fearful of falling.  PLOF- he enjoys getting out of the house for social activities, going out to breakfast too; going grocery shopping a few times/week to get his groceries; prior to his back surgery he walked at the park in [place], hasn't been back though because he is worried about falling though.  He has a home aide that comes a few days a week to assist with household tasks as needed.  Also reports he had a MI a little over a week ago and was in the hospital (8/3-10/16/22)- he states he is referred to begin cardiac rehab as part of his outpatient recovery.  PERTINENT HISTORY: Extensive cardiac history History of lumbar spine surgery- 2022 L4-5 microdiscectomy/laminectomy   PAIN:  Are you having pain? R groin muscle area where he had the cardiac catheterization procedure last week.  PRECAUTIONS: Fall  RED FLAGS: Pt with recent MI (10/13/22) with stent placement (see chart review)  WEIGHT BEARING RESTRICTIONS: No  FALLS:  Has patient fallen in last 6 months? Yes. Number of falls multiple  LIVING ENVIRONMENT: Lives with: lives alone (has a self reported "lady friend" for social support and enjoys being able to get out the house for community activities, going to restaurants together) Lives in: House/apartment Stairs:  he lives in a single level home with bonus room over the garage, but he does not need to go upstairs.1 step to enter in the garage- able to get in without falling. Has following equipment at home: Single point cane, Walker - 4 wheeled, and Wheelchair (manual) Has a home health aide that comes 2-3x/week if he needs assistance  OCCUPATION: retired  PLOF: Independent  PATIENT GOALS: to address drop foot and be able to manage it better; to strengthen his legs; and to increase his breathing; and improve his  balance   NEXT MD VISIT: yes, next week   OBJECTIVE:   DIAGNOSTIC FINDINGS: no recent lower extremity/spine imaging  PATIENT SURVEYS:  FOTO 39/52  COGNITION: Overall cognitive status: Within functional limits for tasks assessed     SENSATION: Pt reports reduced sensation to light touch L L4-5 dermatomes  EDEMA:  No pitting edema noted on dorsum of foot   POSTURE: pt stands with wide base of support for balance; uses UE support on LE's for sit to stand transfer  PALPATION: (-) TTP distal LE b/l  LOWER EXTREMITY ROM:  Active ROM Right eval Left eval  Hip flexion WNL WNL  Hip extension    Hip abduction    Hip adduction    Hip internal rotation    Hip external rotation    Knee flexion 105 105  Knee extension -5 -5  Ankle dorsiflexion 5 deg PROM 0 deg  Ankle plantarflexion    Great toe extension 45 deg PROM 40 deg   Ankle eversion     (Blank rows = not tested)  LOWER EXTREMITY MMT:  MMT Right eval Left eval  Hip flexion 4 4  Hip extension    Hip abduction    Hip adduction    Hip internal rotation    Hip external rotation    Knee flexion 5 5  Knee extension 4 4  Ankle dorsiflexion 4/5 1/5  Ankle plantarflexion 3/5 3/5  Great toe extension 4/5 2/5  Ankle eversion 4/5 3/5   (Blank rows = not tested)   FUNCTIONAL TESTS:  OUTCOME MEASURES: TEST Outcome Interpretation  5 times sit<>stand 21 seconds >60 yo, >15 sec indicates increased risk for falls  10 meter walk test deferred <1.0 m/s indicates increased risk for falls; limited community ambulator  [employer] Assessment /56   deferred <36/56 (100% risk for falls), 37-45 (80% risk for falls); 46-51 (>50% risk for falls); 52-55 (lower risk <25% of falls)      GAIT: Distance walked: from waiting area into PT gym 50 ft, PT supervision Assistive device utilized: None Level of assistance: Complete Independence Comments: Pt ambulates with decreased gait speed, wide BOS, decreased stride length, excess L  hip flexion strategy to compensate for L foot drop  Pt not able to stand SLS on L without UE support today; able to stand R LE x 5 seconds  TODAY'S TREATMENT:  DATE: 04/08/23 Subjective: Pt reports overall he has made significant progress with this course of PT; he feel stronger, has better walking endurance, is less fearful of falling, and is not using a SPC for amb.  He plans to continue with an exercise program at the gym after finishing this course of PT  Pain: 0/10  Objective:  Therapeutic Exercises:  While wearing L AFO, no SPC during session, gait belt and PT supervision during standing exercises  Front step ups: L LE x10, R LE x10  Ascend/descend stairs 4x (4 steps) with b/l railing, using step to gait pattern  Squats: holding 7 lb medicine ball 2x10 cues for hips "back over the chair"  Dynamic balance activity for multidirectional stepping, motor planning/sequencing, short term memory recall, PT verbal/visual/demonstration cues a SBA with gait belt needed for sequencing: -box steps to R and L x5 ea, no UE support -grapevine R/L x4 ea with min UE support -rumba box (forward, L, reverse, R), side stepping R/L x2 ea, grape vine R/L, feet together weight shifts with min UE support- practiced 4x  Squat to pick up cones from floor 8x with good balance demonstrated  Forward walking, reverse walking, side stepping, turning 180 degrees with gait direction change on flat surface in hallway- 4 laps, good balance with this today, no loss of balance in the posterior direction  Nustep x 10 min, level 3-4 for knee mobility/LE strengthening at end of session to focus on LE endurance work after walking in hallway  Pt education regarding gym equipment and what would be appropriate machines to use at his gym, also discussed continuing with squats, sit to stand, and step ups  as leg strengthening at home   PATIENT EDUCATION:  Education details: PT POC/goals, importance of attending cardiac rehab, activity pacing Person educated: Patient Education method: Explanation Education comprehension: verbalized understanding and needs further education  HOME EXERCISE PROGRAM: Access Code: [email or web address] URL: [email or web address] Date: 04/08/2023 Prepared by: Max Fickle  Exercises - Standing Tandem Balance with Counter Support  - 1 x daily - 7 x weekly - 3 reps - 10 seconds hold - Sit to Stand Without Arm Support  - 1 x daily - 4 x weekly - 2 sets - 10 reps - Mini Squat with Chair  - 1 x daily - 4 x weekly - 2 sets - 10 reps - Forward Step Up  - 1 x daily - 4 x weekly - 2 sets - 10 reps  ASSESSMENT:  CLINICAL IMPRESSION: ***Pt able to complete therapeutic exercises today without any significant loss of balance.  Able to use ankle/hip/stepping strategies to regain balance as needed during session.  Able to respond and balance with external perturbation forces to trunk. He is steady while amb in forward/lateral/and reverse directions on flat surfaces.  Will perform re-assessment of goals and objective measures and likely transition to independent HEP at next visit.  OBJECTIVE IMPAIRMENTS: Abnormal gait, cardiopulmonary status limiting activity, decreased activity tolerance, decreased balance, decreased mobility, difficulty walking, decreased ROM, and decreased strength.   ACTIVITY LIMITATIONS: standing, squatting, stairs, transfers, and locomotion level  PARTICIPATION LIMITATIONS: meal prep, cleaning, laundry, interpersonal relationship, shopping, and community activity  PERSONAL FACTORS: Age, Past/current experiences, Time since onset of injury/illness/exacerbation, and cardiovascular hx including recent MI with stent placement last week  are also affecting patient's functional outcome.   REHAB POTENTIAL: Good  CLINICAL DECISION MAKING:  Stable/uncomplicated  EVALUATION COMPLEXITY: Low   GOALS: Goals reviewed with patient? Yes  SHORT TERM GOALS: Target date:  11/11/22 Pt will be able to perform a HEP for LE strengthening >3x/week Baseline: 11/27/22 pt has been instructed on a HEP; 12/24/22: met Goal status: Met   LONG TERM GOALS: Target date: 04/08/23  Improve FOTO to >51 indicating pt able to perform his daily activities without being limited by his balance; 12/24/22: in progress Baseline: 39; 10/7: 54; 01/21/23 51; 02/18/23: in progress; 03/18/23: 51 Goal status: In Progress  2.  Pt will be able to amb with typical width BOS and b/l heel strike gait pattern using least restrictive AD and AFO on flat/incline/decline/grass surfaces x 10 min Baseline: not using any AD or AFO in clinic, pt wide BOS, no L heel strike due to foot drop; 11/27/22 pt is amb with SPC now, discussions continue regarding AFO/brace for improved safety; 12/24/22: pt now wearing AFO and using SPC on uneven surfaces with PT SBA x 5 minutes; 01/21/23: in progress- able to amb intervals of 5-6 min x 2 with 1-2 min sitting break between; 03/18/23: using SPC for >5 min on incline up driveway/neighborhood, he would like to be able to do without SPC again as this was PLOF; 04/08/23: pt states he is no longer using SPC Goal status: Met  3.  Pt will be able to perform 5x STS in <15 sec indicating reduced risk for falling Baseline: 21 sec, 11/27/22 19 sec (with mild UE use); 12/28/22: 18 seconds  (with mild UE support) 01/21/23:  19 seconds without UE support; 02/18/23 17 seconds without UE support; 03/18/23: 19 seconds without UE support; 04/08/23: 18 seconds without UE support Goal status: plateau  4. Pt will be able to amb x 1 block distance to walk his dog 1x/day.  Baseline: 1/4-1/2 block (pt self report), 01/21/23: amb 1 loop around building in 5.25 minutes at clinic; 03/18/23- did not assess due to inclement weather; 04/08/23: pt reports being able to walk ~10 min without  SPC, doing 15 min on TM at gym (he does not have the dog anymore)  Goal status: met  5. Improve 10 M walk test to >1.0 m/s to promote reduced fall risk and improved community ambulator status  Baseline: 12/24/22 .9 m/s; 01/21/23: .8 m/s (at end of session, was fatigued)  Goal status: In progress   6. Pt will independently be able to transfer stand to floor and floor to standing with use of chair/sturdy surface to simulate being able to get up from floor and to reduce fear/risk of falling  Baseline: pt has not tried and expresses fear of not being able to get up from floor living alone; 03/18/23: see above objective section of note; in progress; 04/08/23: pt demonstrates he is able to perform this transfer independently with pt supervision during course of PT  Goal status: met  7. Pt will be able to ascend 1 flight of stairs in his home 2x/day to be able to access second floor of his home independently as he lives at home alone  Baseline: 03/18/23 pt is not doing, fear of falling, and notes feeling "weak"; 04/08/23 pt demonstrates ability to ascend/descend 16 stairs (>1 flight) in clinic safely using step to gait pattern; he states he has not tried his stairs at home due to lack of motivation   Goal status: in progress   8. Pt will transition to independent exercise program 2-3x/week for strengthening at his local fitness center after completing this course of PT    Baseline: 03/18/23 pt states he will reinstate his membership this week; 04/08/23: pt  has gym membership and has been going to [employer] 2x/week for the treadmill this past week.     Goal status: met   PLAN:  PT FREQUENCY: 1-2x/week  PT DURATION: 3 weeks  PLANNED INTERVENTIONS: Therapeutic exercises, Therapeutic activity, Neuromuscular re-education, Balance training, Gait training, Patient/Family education, Self Care, Joint mobilization, and Orthotic/Fit training  PLAN FOR NEXT SESSION: pt is transitioning to independent HEP; D/C  this course of PT  Max Fickle, PT, DPT, OCS  Physical Therapist - [facility]  [facility]   Ardine Bjork, PT  12:02 PM

## 2023-04-10 ENCOUNTER — Ambulatory Visit: Payer: Medicare HMO

## 2023-04-11 ENCOUNTER — Encounter: Payer: Medicare HMO | Admitting: *Deleted

## 2023-04-11 DIAGNOSIS — Z955 Presence of coronary angioplasty implant and graft: Secondary | ICD-10-CM

## 2023-04-11 DIAGNOSIS — I214 Non-ST elevation (NSTEMI) myocardial infarction: Secondary | ICD-10-CM

## 2023-04-11 DIAGNOSIS — Z48812 Encounter for surgical aftercare following surgery on the circulatory system: Secondary | ICD-10-CM | POA: Diagnosis not present

## 2023-04-11 NOTE — Progress Notes (Signed)
Daily Session Note  Patient Details  Name: March Steyer [facility]. MRN: [id number] Date of Birth: 1945-08-02 Referring Provider:   Flowsheet Row Cardiac Rehab from 11/19/2022 in [facility] Cardiac and Pulmonary Rehab  Referring Provider Dr. Adrian Blackwater MD       Encounter Date: 04/11/2023  Check In:  Session Check In - 04/11/23 1554       Check-In   Supervising physician immediately available to respond to emergencies See telemetry face sheet for immediately available ER MD    Location [facility]-Cardiac & Pulmonary Rehab    Staff Present Maxon Conetta BS, Exercise Physiologist;Noah Tickle, BS, Exercise Physiologist;Anjali Manzella Jewel Baize RN,BSN;Joseph Hood RCP,RRT,BSRT    Virtual Visit No    Medication changes reported     No    Fall or balance concerns reported    No    Warm-up and Cool-down Performed on first and last piece of equipment    Resistance Training Performed Yes    VAD Patient? No    PAD/SET Patient? No      Pain Assessment   Currently in Pain? No/denies                Social History   Tobacco Use  Smoking Status Former  Smokeless Tobacco Never  Tobacco Comments   Quit over 40 years ago    Goals Met:  Independence with exercise equipment Exercise tolerated well No report of concerns or symptoms today Strength training completed today  Goals Unmet:  Not Applicable  Comments: Pt able to follow exercise prescription today without complaint.  Will continue to monitor for progression.    Dr. Bethann Punches is Medical Director for [facility] Cardiac Rehabilitation.  Dr. Vida Rigger is Medical Director for [facility] Pulmonary Rehabilitation.

## 2023-04-15 ENCOUNTER — Encounter: Payer: Medicare HMO | Attending: Cardiovascular Disease

## 2023-04-15 DIAGNOSIS — Z955 Presence of coronary angioplasty implant and graft: Secondary | ICD-10-CM | POA: Insufficient documentation

## 2023-04-15 DIAGNOSIS — I214 Non-ST elevation (NSTEMI) myocardial infarction: Secondary | ICD-10-CM | POA: Insufficient documentation

## 2023-04-18 ENCOUNTER — Encounter: Payer: Medicare HMO | Admitting: *Deleted

## 2023-04-18 DIAGNOSIS — I214 Non-ST elevation (NSTEMI) myocardial infarction: Secondary | ICD-10-CM

## 2023-04-18 DIAGNOSIS — Z955 Presence of coronary angioplasty implant and graft: Secondary | ICD-10-CM

## 2023-04-18 NOTE — Progress Notes (Signed)
 Daily Session Note  Patient Details  Name: Devin [name] [facility]. MRN: [id number] Date of Birth: 07-28-1945 Referring Provider:   Flowsheet Row Cardiac Rehab from 11/19/2022 in [facility] Cardiac and Pulmonary Rehab  Referring Provider Dr. Denyse Bathe MD       Encounter Date: 04/18/2023  Check In:  Session Check In - 04/18/23 1558       Check-In   Supervising physician immediately available to respond to emergencies See telemetry face sheet for immediately available ER MD    Location [facility]-Cardiac & Pulmonary Rehab    Staff Present Hoy Rodney RN,BSN;[facility] BS, Exercise Physiologist;Noah Tickle, BS, Exercise Physiologist    Virtual Visit No    Medication changes reported     No    Fall or balance concerns reported    No    Warm-up and Cool-down Performed on first and last piece of equipment    Resistance Training Performed Yes    VAD Patient? No    PAD/SET Patient? No      Pain Assessment   Currently in Pain? No/denies                Social History   Tobacco Use  Smoking Status Former  Smokeless Tobacco Never  Tobacco Comments   Quit over 40 years ago    Goals Met:  Independence with exercise equipment Exercise tolerated well No report of concerns or symptoms today Strength training completed today  Goals Unmet:  Not Applicable  Comments: Pt able to follow exercise prescription today without complaint.  Will continue to monitor for progression.    Dr. Oneil Pinal is Medical Director for [facility] Cardiac Rehabilitation.  Dr. Fuad Aleskerov is Medical Director for [facility] Pulmonary Rehabilitation.

## 2023-04-24 DIAGNOSIS — Z955 Presence of coronary angioplasty implant and graft: Secondary | ICD-10-CM

## 2023-04-24 DIAGNOSIS — I214 Non-ST elevation (NSTEMI) myocardial infarction: Secondary | ICD-10-CM

## 2023-04-24 NOTE — Progress Notes (Signed)
Cardiac Individual Treatment Plan  Patient Details  Name: Devin Stump [facility]. MRN: [id number] Date of Birth: Jul 07, 1945 Referring Provider:   Flowsheet Row Cardiac Rehab from 11/19/2022 in [facility] Cardiac and Pulmonary Rehab  Referring Provider Dr. Adrian Blackwater MD       Initial Encounter Date:  Flowsheet Row Cardiac Rehab from 11/19/2022 in [facility] Cardiac and Pulmonary Rehab  Date 11/19/22       Visit Diagnosis: NSTEMI (non-ST elevation myocardial infarction) [facility])  Status post coronary artery stent placement  Patient's Home Medications on Admission:  Current Outpatient Medications:    albuterol (VENTOLIN HFA) 108 (90 Base) MCG/ACT inhaler, Inhale 2 puffs into the lungs every 6 (six) hours as needed for wheezing or shortness of breath., Disp: , Rfl:    amiodarone (PACERONE) 200 MG tablet, Take 200 mg by mouth daily., Disp: , Rfl:    amLODipine (NORVASC) 10 MG tablet, Take 1 tablet by mouth daily., Disp: , Rfl:    apixaban (ELIQUIS) 5 MG TABS tablet, Take 1 tablet (5 mg total) by mouth 2 (two) times daily., Disp: 60 tablet, Rfl: 3   atorvastatin (LIPITOR) 80 MG tablet, Take 80 mg by mouth daily., Disp: , Rfl:    buPROPion (WELLBUTRIN XL) 300 MG 24 hr tablet, Take 300 mg by mouth daily., Disp: , Rfl:    cephALEXin (KEFLEX) 500 MG capsule, Take 500 mg by mouth 3 (three) times daily., Disp: , Rfl:    clopidogrel (PLAVIX) 75 MG tablet, Take 1 tablet (75 mg total) by mouth daily with breakfast., Disp: 30 tablet, Rfl: 11   docusate (COLACE) 60 MG/15ML syrup, Take 60 mg by mouth daily., Disp: , Rfl:    EPINEPHrine 0.3 mg/0.3 mL IJ SOAJ injection, Inject 0.3 mg into the muscle as needed for anaphylaxis., Disp: , Rfl:    ezetimibe (ZETIA) 10 MG tablet, Take 10 mg by mouth daily., Disp: , Rfl:    fluticasone (FLONASE) 50 MCG/ACT nasal spray, SPRAY 2 SPRAYS INTO EACH NOSTRIL EVERY DAY, Disp: , Rfl:    gabapentin (NEURONTIN) 300 MG capsule, Take 300 mg by mouth 2 (two) times daily., Disp: , Rfl:     loratadine (CLARITIN) 10 MG tablet, Take 10 mg by mouth daily., Disp: , Rfl:    metFORMIN (GLUCOPHAGE) 500 MG tablet, Take 1,000 mg by mouth 2 (two) times daily., Disp: , Rfl:    metoprolol succinate (TOPROL XL) 50 MG 24 hr tablet, Take 1 tablet (50 mg total) by mouth daily. Take with or immediately following a meal., Disp: 30 tablet, Rfl: 11   nitroGLYCERIN (NITROSTAT) 0.4 MG SL tablet, Place 0.4 mg under the tongue every 5 (five) minutes as needed for chest pain., Disp: , Rfl:    pantoprazole (PROTONIX) 40 MG tablet, TAKE 1 TABLET BY MOUTH EVERY DAY, Disp: 30 tablet, Rfl: 1   sacubitril-valsartan (ENTRESTO) 97-103 MG, TAKE 1 TABLET BY MOUTH TWICE A DAY, Disp: 180 tablet, Rfl: 1  Past Medical History: 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   Hypertension    Ischemic cardiomyopathy    MI,  old    Peripheral neuropathy    PFO (patent foramen ovale) 05/2019   s/p repair   Sleep apnea    Spinal stenosis of lumbar region    TIA (transient ischemic attack)     Tobacco Use: Social History   Tobacco Use  Smoking Status Former  Smokeless Tobacco Never  Tobacco Comments   Quit over 40 years ago    Labs: Review Flowsheet  More data exists      Latest Ref Rng & Units 04/08/2020 07/04/2020 04/12/2022 04/13/2022 10/13/2022  Labs for ITP Cardiac and Pulmonary Rehab  Cholestrol 0 - 200 mg/dL - - - - 811   LDL (calc) 0 - 99 mg/dL - - - - 35   HDL-C >91 mg/dL - - - - 59   Trlycerides <150 mg/dL - - - - 55   Hemoglobin A1c 4.8 - 5.6 % 5.8  - - 5.5  6.3   Bicarbonate 20.0 - 28.0 mmol/L - - 23.9  - -  TCO2 22 - 32 mmol/L - 24  - - -   Acid-base deficit 0.0 - 2.0 mmol/L - - 0.1  - -  O2 Saturation % - - 90.3  - -     Exercise Target Goals: Exercise Program Goal: Individual exercise prescription set using results from initial 6 min walk test and THRR while considering  patient's activity barriers and safety.   Exercise Prescription Goal: Initial exercise prescription builds to 30-45 minutes a day of aerobic activity, 2-3 days per week.  Home exercise guidelines will be given to patient during program as part of exercise prescription that the participant will acknowledge.   Education: Aerobic Exercise: - Group verbal and visual presentation on the components of exercise prescription. Introduces F.I.T.T principle from ACSM for exercise prescriptions.  Reviews F.I.T.T. principles of aerobic exercise including progression. Written material given at graduation. Flowsheet Row Cardiac Rehab from 11/19/2022 in [facility] Cardiac and Pulmonary Rehab  Education need identified 11/19/22       Education: Resistance Exercise: - Group verbal and visual presentation on the components of exercise prescription. Introduces F.I.T.T principle from ACSM for exercise prescriptions  Reviews F.I.T.T. principles of resistance exercise including progression. Written material given at graduation.    Education: Exercise & Equipment Safety: - Individual verbal instruction and demonstration of equipment use and safety with use of the equipment. Flowsheet Row Cardiac Rehab from 11/19/2022 in [facility] Cardiac and Pulmonary Rehab  Date 11/19/22  Educator NT  Instruction Review Code 1- Verbalizes Understanding       Education: Exercise Physiology & General Exercise Guidelines: - Group verbal and written instruction with models to review the exercise physiology of the cardiovascular system and associated critical values. Provides general exercise guidelines with specific guidelines to those with heart or lung disease.  Flowsheet Row Cardiac Rehab from  10/07/2019 in [facility] Cardiac and Pulmonary Rehab  Date 09/23/19  Educator AS  Instruction Review Code 1- Verbalizes Understanding       Education: Flexibility, Balance, Mind/Body Relaxation: - Group verbal and visual presentation with interactive activity on the components of exercise prescription. Introduces F.I.T.T principle from ACSM for exercise prescriptions. Reviews F.I.T.T. principles of flexibility and balance exercise training including progression. Also discusses the mind body connection.  Reviews various relaxation techniques to help reduce and manage stress (i.e. Deep breathing, progressive muscle relaxation, and visualization). Balance handout provided to take home. Written material given at graduation.   Activity Barriers & Risk Stratification:  Activity Barriers & Cardiac Risk Stratification -  11/19/22 1442       Activity Barriers & Cardiac Risk Stratification   Activity Barriers Right Knee Replacement;Left Knee Replacement;Muscular Weakness;Balance Concerns;Other (comment)    Comments drop foot, impaired gait, neuropathy    Cardiac Risk Stratification Moderate             6 Minute Walk:  6 Minute Walk     Row Name 11/19/22 1440         6 Minute Walk   Phase Initial     Distance 620 feet     Walk Time 6 minutes     # of Rest Breaks 0     MPH 1.17     METS 1.02     RPE 11     Perceived Dyspnea  0     VO2 Peak 3.59     Symptoms No     Resting HR 56 bpm     Resting BP 128/68     Resting Oxygen Saturation  97 %     Exercise Oxygen Saturation  during 6 min walk 96 %     Max Ex. HR 68 bpm     Max Ex. BP 138/76     2 Minute Post BP 136/70              Oxygen Initial Assessment:   Oxygen Re-Evaluation:   Oxygen Discharge (Final Oxygen Re-Evaluation):   Initial Exercise Prescription:  Initial Exercise Prescription - 11/19/22 1400       Date of Initial Exercise RX and Referring Provider   Date 11/19/22    Referring Provider Dr. Adrian Blackwater  MD      Oxygen   Maintain Oxygen Saturation 88% or higher      Treadmill   MPH 1    Grade 0    Minutes 15    METs 1.8      NuStep   Level 1    SPM 80    Minutes 15    METs 1.02      Arm Ergometer   Level 1    RPM 50    Minutes 15    METs 1.02      Track   Laps 16    Minutes 15    METs 1.87      Prescription Details   Frequency (times per week) 2    Duration Progress to 30 minutes of continuous aerobic without signs/symptoms of physical distress      Intensity   THRR 40-80% of Max Heartrate 90-124    Ratings of Perceived Exertion 11-13    Perceived Dyspnea 0-4      Progression   Progression Continue to progress workloads to maintain intensity without signs/symptoms of physical distress.      Resistance Training   Training Prescription Yes    Weight 4 lb    Reps 10-15             Perform Capillary Blood Glucose checks as needed.  Exercise Prescription Changes:   Exercise Prescription Changes     Row Name 11/19/22 1400 12/13/22 0800 12/26/22 0800 01/09/23 0900 01/24/23 1400     Response to Exercise   Blood Pressure (Admit) 128/68 126/58 108/56 122/60 112/58   Blood Pressure (Exercise) 138/76 152/66 142/70 144/64 --   Blood Pressure (Exit) 136/70 124/68 108/60 118/60 124/64   Heart Rate (Admit) 56 bpm 69 bpm 74 bpm 74 bpm 104 bpm   Heart Rate (Exercise) 68 bpm 104 bpm 95 bpm 91 bpm  108 bpm   Heart Rate (Exit) 60 bpm 69 bpm 68 bpm 83 bpm 95 bpm   Oxygen Saturation (Admit) 97 % -- -- -- --   Oxygen Saturation (Exercise) 96 % -- -- -- --   Rating of Perceived Exertion (Exercise) 11 13 15 13 13    Perceived Dyspnea (Exercise) 0 -- -- -- 0   Symptoms none none none none none   Comments Results First two weeks of exercise -- -- --   Duration -- Continue with 30 min of aerobic exercise without signs/symptoms of physical distress. Continue with 30 min of aerobic exercise without signs/symptoms of physical distress. Continue with 30 min of aerobic  exercise without signs/symptoms of physical distress. Continue with 30 min of aerobic exercise without signs/symptoms of physical distress.   Intensity -- THRR unchanged THRR unchanged THRR unchanged THRR unchanged     Progression   Progression -- Continue to progress workloads to maintain intensity without signs/symptoms of physical distress. Continue to progress workloads to maintain intensity without signs/symptoms of physical distress. Continue to progress workloads to maintain intensity without signs/symptoms of physical distress. Continue to progress workloads to maintain intensity without signs/symptoms of physical distress.   Average METs -- 2.29 2.09 2.53 --     Resistance Training   Training Prescription -- Yes Yes Yes Yes   Weight -- none  Pt's doctor did not give clearance for resistance training none  Pt's doctor did not give clearance for resistance training none  Pt's doctor did not give clearance for resistance training none  Pt's doctor did not give clearance for resistance training   Reps -- 10-15 10-15 10-15 10-15     Interval Training   Interval Training -- No No No No     Treadmill   MPH -- 1.7 1.2 -- --   Grade -- 0 0 -- --   Minutes -- 15 15 -- --   METs -- 2.3 1.92 -- --     NuStep   Level -- 4 4 4  --   Minutes -- 15 15 30  --   METs -- 3.1 2.7 3 --     Arm Ergometer   Level -- -- 1 -- --   Minutes -- -- 15 -- --   METs -- -- 1 -- --     T5 Nustep   Level -- -- -- 1 4   Minutes -- -- -- 15 15     Biostep-RELP   Level -- 1 -- -- --   Minutes -- 15 -- -- --   METs -- 2 -- -- --     Track   Laps -- 16 25 10 10    Minutes -- 15 15 15 15    METs -- 1.87 2.36 1.54 --     Oxygen   Maintain Oxygen Saturation -- 88% or higher 88% or higher 88% or higher 88% or higher    Row Name 02/06/23 0800 02/11/23 1600 02/20/23 1500 03/05/23 0900 03/21/23 1600     Response to Exercise   Blood Pressure (Admit) 104/60 -- 102/58 138/60 106/60   Blood Pressure (Exit)  98/60 -- 100/54 98/52 118/60   Heart Rate (Admit) 88 bpm -- 72 bpm 57 bpm 81 bpm   Heart Rate (Exercise) 97 bpm -- 85 bpm 81 bpm 112 bpm   Heart Rate (Exit) 75 bpm -- 82 bpm 60 bpm 59 bpm   Rating of Perceived Exertion (Exercise) 15 -- 15 15 15    Symptoms none --  none none none   Duration Continue with 30 min of aerobic exercise without signs/symptoms of physical distress. Continue with 30 min of aerobic exercise without signs/symptoms of physical distress. Continue with 30 min of aerobic exercise without signs/symptoms of physical distress. Continue with 30 min of aerobic exercise without signs/symptoms of physical distress. Continue with 30 min of aerobic exercise without signs/symptoms of physical distress.   Intensity THRR unchanged THRR unchanged THRR unchanged THRR unchanged THRR unchanged     Progression   Progression Continue to progress workloads to maintain intensity without signs/symptoms of physical distress. Continue to progress workloads to maintain intensity without signs/symptoms of physical distress. Continue to progress workloads to maintain intensity without signs/symptoms of physical distress. Continue to progress workloads to maintain intensity without signs/symptoms of physical distress. Continue to progress workloads to maintain intensity without signs/symptoms of physical distress.   Average METs 2.77 2.77 2.13 2.34 2.05     Resistance Training   Training Prescription Yes Yes Yes Yes Yes   Weight none  Pt's doctor did not give clearance for resistance training none  Pt's doctor did not give clearance for resistance training none  Pt's doctor did not give clearance for resistance training none  Pt's doctor did not give clearance for resistance training none  Pt's doctor did not give clearance for resistance training   Reps 10-15 10-15 10-15 10-15 10-15     Interval Training   Interval Training No No No No No     NuStep   Level 5 5 4 3 4    Minutes 15 15 15 15 15    METs  2.6 2.6 2.6 3 2.5     T5 Nustep   Level -- -- -- 3  T6 --   Minutes -- -- -- 15 --   METs -- -- -- 2.2 --     Track   Laps 10 10 3 15 11    Minutes 15 15 15 15 15    METs 1.54 1.54 1.16 1.82 1.6     Home Exercise Plan   Plans to continue exercise at -- Home (comment)  walking Home (comment)  walking Home (comment)  walking Home (comment)  walking   Frequency -- Add 2 additional days to program exercise sessions. Add 2 additional days to program exercise sessions. Add 2 additional days to program exercise sessions. Add 2 additional days to program exercise sessions.   Initial Home Exercises Provided -- 02/11/23 02/11/23 02/11/23 02/11/23     Oxygen   Maintain Oxygen Saturation 88% or higher 88% or higher 88% or higher 88% or higher 88% or higher    Row Name 04/01/23 1600 04/16/23 1400           Response to Exercise   Blood Pressure (Admit) 102/56 92/58      Blood Pressure (Exit) 102/54 108/62      Heart Rate (Admit) 76 bpm 59 bpm      Heart Rate (Exercise) 87 bpm 107 bpm      Heart Rate (Exit) 74 bpm 71 bpm      Rating of Perceived Exertion (Exercise) 15 15      Symptoms none none      Duration Continue with 30 min of aerobic exercise without signs/symptoms of physical distress. Continue with 30 min of aerobic exercise without signs/symptoms of physical distress.      Intensity THRR unchanged THRR unchanged        Progression   Progression Continue to progress workloads to maintain intensity without  signs/symptoms of physical distress. Continue to progress workloads to maintain intensity without signs/symptoms of physical distress.      Average METs 1.99 2.1        Resistance Training   Training Prescription Yes Yes      Weight none  Pt's doctor did not give clearance for resistance training 4 lb      Reps 10-15 10-15        Interval Training   Interval Training No No        Treadmill   MPH 1 1.5      Grade 0 0      Minutes 15 15      METs 1.77 2.15        NuStep    Level 3  T6 nustep: level 3 4  T6: level 4      Minutes 15 15      METs 2.3  T6 nustep: 1.9 METs 2.3  T6: 1.8 METs        Home Exercise Plan   Plans to continue exercise at Home (comment)  walking Home (comment)  walking      Frequency Add 2 additional days to program exercise sessions. Add 2 additional days to program exercise sessions.      Initial Home Exercises Provided 02/11/23 02/11/23        Oxygen   Maintain Oxygen Saturation 88% or higher 88% or higher               Exercise Comments:   Exercise Comments     Row Name 11/27/22 0941           Exercise Comments First full day of exercise!  Patient was oriented to gym and equipment including functions, settings, policies, and procedures.  Patient's individual exercise prescription and treatment plan were reviewed.  All starting workloads were established based on the results of the 6 minute walk test done at initial orientation visit.  The plan for exercise progression was also introduced and progression will be customized based on patient's performance and goals.  AAA parameters  set by his physician explained to patient.                Exercise Goals and Review:   Exercise Goals     Row Name 11/19/22 1442             Exercise Goals   Increase Physical Activity Yes       Intervention Provide advice, education, support and counseling about physical activity/exercise needs.;Develop an individualized exercise prescription for aerobic and resistive training based on initial evaluation findings, risk stratification, comorbidities and participant's personal goals.       Expected Outcomes Long Term: Add in home exercise to make exercise part of routine and to increase amount of physical activity.;Long Term: Exercising regularly at least 3-5 days a week.;Short Term: Attend rehab on a regular basis to increase amount of physical activity.       Increase Strength and Stamina Yes       Intervention Provide advice,  education, support and counseling about physical activity/exercise needs.;Develop an individualized exercise prescription for aerobic and resistive training based on initial evaluation findings, risk stratification, comorbidities and participant's personal goals.       Expected Outcomes Short Term: Increase workloads from initial exercise prescription for resistance, speed, and METs.;Short Term: Perform resistance training exercises routinely during rehab and add in resistance training at home;Long Term: Improve cardiorespiratory fitness, muscular endurance and strength as measured by  increased METs and functional capacity ( )       Able to understand and use rate of perceived exertion (RPE) scale Yes       Intervention Provide education and explanation on how to use RPE scale       Expected Outcomes Short Term: Able to use RPE daily in rehab to express subjective intensity level;Long Term:  Able to use RPE to guide intensity level when exercising independently       Able to understand and use Dyspnea scale Yes       Intervention Provide education and explanation on how to use Dyspnea scale       Expected Outcomes Short Term: Able to use Dyspnea scale daily in rehab to express subjective sense of shortness of breath during exertion;Long Term: Able to use Dyspnea scale to guide intensity level when exercising independently       Knowledge and understanding of Target Heart Rate Range (THRR) Yes       Intervention Provide education and explanation of THRR including how the numbers were predicted and where they are located for reference       Expected Outcomes Short Term: Able to state/look up THRR;Short Term: Able to use daily as guideline for intensity in rehab;Long Term: Able to use THRR to govern intensity when exercising independently       Able to check pulse independently Yes       Intervention Provide education and demonstration on how to check pulse in carotid and radial arteries.;Review the  importance of being able to check your own pulse for safety during independent exercise       Expected Outcomes Short Term: Able to explain why pulse checking is important during independent exercise;Long Term: Able to check pulse independently and accurately       Understanding of Exercise Prescription Yes       Intervention Provide education, explanation, and written materials on patient's individual exercise prescription       Expected Outcomes Short Term: Able to explain program exercise prescription;Long Term: Able to explain home exercise prescription to exercise independently                Exercise Goals Re-Evaluation :  Exercise Goals Re-Evaluation     Row Name 11/27/22 0941 12/13/22 0818 12/26/22 0804 01/09/23 0941 01/15/23 0942     Exercise Goal Re-Evaluation   Exercise Goals Review Able to understand and use rate of perceived exertion (RPE) scale;Knowledge and understanding of Target Heart Rate Range (THRR);Understanding of Exercise Prescription;Able to understand and use Dyspnea scale Increase Physical Activity;Increase Strength and Stamina;Understanding of Exercise Prescription Increase Physical Activity;Increase Strength and Stamina;Understanding of Exercise Prescription Increase Physical Activity;Increase Strength and Stamina;Understanding of Exercise Prescription Increase Physical Activity;Increase Strength and Stamina;Understanding of Exercise Prescription   Comments Reviewed RPE  and dyspnea scale, THR and program prescription with pt today.  Pt voiced understanding and was given a copy of goals to take home. Ed is off to a good start in the program. He has done well on the treadmill so far, as he increased his workload up to 1.7 mph with no incline. He also has done well at level one on the biostep and improved to level 4 on the T4 nustep. He has not any done any resistance training at this time due to not receiving clearance from his doctor. We will continue to monitor his  progress in the program. Ed is doing well in rehab. He has continued to walk the track and  increased from 16 laps to 25 laps! He also has has continued to do well at level 4 on the T4 nustep and level 1 on the arm ergometer. He has not done any resistance training at this time due to doctor's restrictions. We will continue to monitor his progress in the program. Ed is doing well in rehab. He has only walked the track once since the last review and was able to do 10 laps. He also continues to work at level 4 on the T4 nustep but was able to exercise for 30 minutes. He also began using the T5 nustep at level 1. We will continue to monitor his progress in the program. He is doing well at reahb on level 4 on T4 and T5. He goes to PT 2 times per week. Otherwise his exercise at home is limited. Encouraged him to continue to work on strength and balance.   Expected Outcomes Short: Use RPE daily to regulate intensity. Long: Follow program prescription in THR. Short:Continue to follow current exercise prescription. Long: Continue exercise to improve strength and stamina. Short: Increase workload on arm ergometer. Long: Continue exercise to improve strength and stamina. Short: Increase laps on the track back up to previous number. Long: Continue exercise to improve strength and stamina. Short: walk more at home, imporve strength and balance. Long: Continue to work on Adult nurse.    Row Name 01/24/23 1448 02/06/23 0814 02/11/23 1612 02/20/23 1550 03/05/23 0951     Exercise Goal Re-Evaluation   Exercise Goals Review Increase Physical Activity;Increase Strength and Stamina;Understanding of Exercise Prescription Increase Physical Activity;Increase Strength and Stamina;Understanding of Exercise Prescription Increase Physical Activity;Able to understand and use rate of perceived exertion (RPE) scale;Knowledge and understanding of Target Heart Rate Range (THRR);Understanding of Exercise Prescription;Increase Strength  and Stamina;Able to understand and use Dyspnea scale;Able to check pulse independently Increase Physical Activity;Increase Strength and Stamina;Understanding of Exercise Prescription Increase Physical Activity;Increase Strength and Stamina;Understanding of Exercise Prescription   Comments Ed continues to do well in rehab. He was recently able to increase his level on the T5 nustep from level 1 to 4. He was also able to maintain 10 track laps in 15 minutes. We will continue to monitor his progress in the program. Ed has only attended two sessions since the last review. He continues to do well walking the track and has consistently reached 10 laps. He also recently improved to level 5 on the T4 nustep. We will continue to monitor his progress in the program. Reviewed home exercise with pt today.  Pt plans to walk and continue with physical therapy for exercise.  Reviewed THR, pulse, RPE, sign and symptoms, pulse oximetery and when to call 911 or MD.  Also discussed weather considerations and indoor options.  Pt voiced understanding. Ed continues to attend rehab consistently. He has only walked the track once since the last review and was only able to do 3 laps. He also continues to work at level 4 on the T4 nustep. We will encourage him to continue to progressively increase his workloads in order to see progress in the program. We will continue to monitor his progress. Ed is doing well in rehab. He was able to increase his laps on the track to 15. He worked at level 3 on the T4 and T6 nusteps. We are continuing to encourage him to progessively increase his workloads in order to see progress in the program. We will continue to monitor his progress in the program.  Expected Outcomes Short: Increase track laps, return to using the T4 nustep. Long: Continue exercise to improve strength and stamina. Short: Attend rehab more consistently. Long: Continue exercise to improve strength and stamina. Short: add 1-2 days a week  of walking on off days of cardiac rehab and PT. Long: become independent with exercise routine. Short: Continue to progressively increase workloads and push for more laps on the track. Long: Continue exercise to improve strength and stamina. Short: Continue to progressively increase workloads on the track and nusteps. Long: Continue exercise to improve strength and stamina.    Row Name 03/14/23 1551 03/21/23 1607 04/01/23 1623 04/16/23 1440       Exercise Goal Re-Evaluation   Exercise Goals Review Increase Physical Activity;Increase Strength and Stamina;Understanding of Exercise Prescription Increase Physical Activity;Increase Strength and Stamina;Understanding of Exercise Prescription Increase Physical Activity;Increase Strength and Stamina;Understanding of Exercise Prescription Increase Physical Activity;Increase Strength and Stamina;Understanding of Exercise Prescription    Comments Ed continues to walk outside around his neighborhood on days that he doesnt come to rehab or PT. He states that he tries to do it when he feels comfortable with his balance. Ed is doing well in rehab. He only uses one machine each time he comes but has done well walking the track and using the T4 nustep at level 4. He also has not done any resistance training due to lifting restrictions. We will continue to monitor his progress in the program. Ed is doing well in rehab. He recently began walking the treadmill at a speed of 1 mph with no incline. He also continues to work at level 3 on both the T4 nustep and T6 nustep. We will continue to monitor his progress in the program. Ed continues to do well in the program. He recently increased his treadmill speed to 1.5 mph. He also improved to level 4 on the T6 nustep and continues to work at level 4 on the T4 nustep. We will continue to monitor his progress in the program.    Expected Outcomes Short: Continue to exercise at home. Long: Continue exercise to improve strength and  stamina. Short: Continue to to push for more laps on the track. Long: Continue exercise to improve strength and stamina. Short: Increase to level 4 on the T4 and T6 nustep machines. Long: Continue exercise to improve strength and stamina. Short: Increase to level 5 on the T4 nustep. Long: Continue exercise to improve strength and stamina.             Discharge Exercise Prescription (Final Exercise Prescription Changes):  Exercise Prescription Changes - 04/16/23 1400       Response to Exercise   Blood Pressure (Admit) 92/58    Blood Pressure (Exit) 108/62    Heart Rate (Admit) 59 bpm    Heart Rate (Exercise) 107 bpm    Heart Rate (Exit) 71 bpm    Rating of Perceived Exertion (Exercise) 15    Symptoms none    Duration Continue with 30 min of aerobic exercise without signs/symptoms of physical distress.    Intensity THRR unchanged      Progression   Progression Continue to progress workloads to maintain intensity without signs/symptoms of physical distress.    Average METs 2.1      Resistance Training   Training Prescription Yes    Weight 4 lb    Reps 10-15      Interval Training   Interval Training No      Treadmill   MPH 1.5  Grade 0    Minutes 15    METs 2.15      NuStep   Level 4   T6: level 4   Minutes 15    METs 2.3   T6: 1.8 METs     Home Exercise Plan   Plans to continue exercise at Home (comment)   walking   Frequency Add 2 additional days to program exercise sessions.    Initial Home Exercises Provided 02/11/23      Oxygen   Maintain Oxygen Saturation 88% or higher             Nutrition:  Target Goals: Understanding of nutrition guidelines, daily intake of sodium 1500mg , cholesterol 200mg , calories 30% from fat and 7% or less from saturated fats, daily to have 5 or more servings of fruits and vegetables.  Education: All About Nutrition: -Group instruction provided by verbal, written material, interactive activities, discussions, models, and  posters to present general guidelines for heart healthy nutrition including fat, fiber, MyPlate, the role of sodium in heart healthy nutrition, utilization of the nutrition label, and utilization of this knowledge for meal planning. Follow up email sent as well. Written material given at graduation. Flowsheet Row Cardiac Rehab from 11/19/2022 in [facility] Cardiac [facility] Pulmonary Rehab  Education need identified 11/19/22       Biometrics:  Pre Biometrics - 11/19/22 1443       Pre Biometrics   Height 5' 11.5" (1.816 m)    Weight 205 lb 3.2 oz (93.1 kg)    Waist Circumference 43 inches    Hip Circumference 42 inches    Waist to Hip Ratio 1.02 %    BMI (Calculated) 28.22    Single Leg Stand 1.2 seconds              Nutrition Therapy Plan and Nutrition Goals:  Nutrition Therapy & Goals - 11/27/22 1131       Nutrition Therapy   Diet Carb controlled, Cardiac, low na    Protein (specify units) 90    Fiber 30 grams    Whole Grain Foods 3 servings    Saturated Fats 15 max. grams    Fruits and Vegetables 5 servings/day    Sodium 2 grams      Personal Nutrition Goals   Nutrition Goal Eat a protein at every meal and pair it with a carb    Personal Goal #2 Use protein snacks if needed    Personal Goal #3 Use sweets smartly, and make good snack choices    Comments patient drinking >64oz of water most days. Rarely drinks sugary drinks. He eats 3 meals per day. Has veteran in-home assistance to help him make meals. Likes to go out to restaurants for social interaction, but tries to make good food choices. Reviewed mediterranean diet handout. Educated on types of fats, sources, and how to read labels. He reports he doesn't use salt at home, commended him and educated about sodium limits, how processed foods can have lots in them before arriving to the table. Reviewed his 24hr food recall, encouraged more protein. Recommended greek yogurt, cottage cheese, hard boiled eggs, tuna, chicken, premier  protein shakes as ways to boost protein intake. Build out several meals and snacks with foods he likes and will eat, focusing on adequate protein intake, healthy fats and colorful plates with controlled portions of carbs.      Intervention Plan   Intervention Prescribe, educate and counsel regarding individualized specific dietary modifications aiming towards targeted core  components such as weight, hypertension, lipid management, diabetes, heart failure and other comorbidities.;Nutrition handout(s) given to patient.    Expected Outcomes Long Term Goal: Adherence to prescribed nutrition plan.;Short Term Goal: A plan has been developed with personal nutrition goals set during dietitian appointment.;Short Term Goal: Understand basic principles of dietary content, such as calories, fat, sodium, cholesterol and nutrients.             Nutrition Assessments:  MEDIFICTS Score Key: >=70 Need to make dietary changes  40-70 Heart Healthy Diet <= 40 Therapeutic Level Cholesterol Diet  Flowsheet Row Cardiac Rehab from 11/19/2022 in [facility] Cardiac and Pulmonary Rehab  Picture Your Plate Total Score on Admission 72      Picture Your Plate Scores: <29 Unhealthy dietary pattern with much room for improvement. 41-50 Dietary pattern unlikely to meet recommendations for good health and room for improvement. 51-60 More healthful dietary pattern, with some room for improvement.  >60 Healthy dietary pattern, although there may be some specific behaviors that could be improved.    Nutrition Goals Re-Evaluation:  Nutrition Goals Re-Evaluation     Row Name 01/15/23 0945 03/14/23 1604           Goals   Comment He reports he is doing well with his foods, including more veggies and lean meats. Cutting back on fatty beef and fried foods. He reports he is snacking less and choosing more colorful produce. He states that he is eating relatively well. He said he struggles to find meals to cook for himself, so he  often goes out, but he claims that he does not eat fried foods.      Expected Outcome Short: continue to limit fatty meats and sugary beverages. Long: Work towards maintain heart healthy diet Short: continue to limit fatty meats and sugary beverages. Long: Work towards maintain heart healthy diet               Nutrition Goals Discharge (Final Nutrition Goals Re-Evaluation):  Nutrition Goals Re-Evaluation - 03/14/23 1604       Goals   Comment He states that he is eating relatively well. He said he struggles to find meals to cook for himself, so he often goes out, but he claims that he does not eat fried foods.    Expected Outcome Short: continue to limit fatty meats and sugary beverages. Long: Work towards maintain heart healthy diet             Psychosocial: Target Goals: Acknowledge presence or absence of significant depression and/or stress, maximize coping skills, provide positive support system. Participant is able to verbalize types and ability to use techniques and skills needed for reducing stress and depression.   Education: Stress, Anxiety, and Depression - Group verbal and visual presentation to define topics covered.  Reviews how body is impacted by stress, anxiety, and depression.  Also discusses healthy ways to reduce stress and to treat/manage anxiety and depression.  Written material given at graduation.   Education: Sleep Hygiene -Provides group verbal and written instruction about how sleep can affect your health.  Define sleep hygiene, discuss sleep cycles and impact of sleep habits. Review good sleep hygiene tips.    Initial Review & Psychosocial Screening:  Initial Psych Review & Screening - 11/07/22 1003       Initial Review   Current issues with None Identified      Family Dynamics   Good Support System? Yes   lady friend     Barriers  Psychosocial barriers to participate in program There are no identifiable barriers or psychosocial needs.       Screening Interventions   Interventions Encouraged to exercise;To provide support and resources with identified psychosocial needs;Provide feedback about the scores to participant    Expected Outcomes Short Term goal: Utilizing psychosocial counselor, staff and physician to assist with identification of specific Stressors or current issues interfering with healing process. Setting desired goal for each stressor or current issue identified.;Long Term Goal: Stressors or current issues are controlled or eliminated.;Short Term goal: Identification and review with participant of any Quality of Life or Depression concerns found by scoring the questionnaire.;Long Term goal: The participant improves quality of Life and PHQ9 Scores as seen by post scores and/or verbalization of changes             Quality of Life Scores:   Quality of Life - 11/19/22 1434       Quality of Life   Select Quality of Life      Quality of Life Scores   Health/Function Pre 22.37 %    Socioeconomic Pre 24.07 %    Psych/Spiritual Pre 22 %    Family Pre 30 %    GLOBAL Pre 23.2 %            Scores of 19 and below usually indicate a poorer quality of life in these areas.  A difference of  2-3 points is a clinically meaningful difference.  A difference of 2-3 points in the total score of the Quality of Life Index has been associated with significant improvement in overall quality of life, self-image, physical symptoms, and general health in studies assessing change in quality of life.  PHQ-9: Review Flowsheet  More data may exist      11/19/2022 01/12/2020 10/28/2019 09/09/2019 07/02/2019  Depression screen PHQ 2/9  Decreased Interest 1 3 2 1 2   Down, Depressed, Hopeless 1 0 0 0 1  PHQ - 2 Score 2 3 2 1 3   Altered sleeping 1 1 3 3 3   Tired, decreased energy 1 2 2  0 2  Change in appetite 1 1 1  0 1  Feeling bad or failure about yourself  0 0 0 0 0  Trouble concentrating 0 0 1 0 0  Moving slowly or fidgety/restless 0 0  1 0 0  Suicidal thoughts 0 1 0 0 0  PHQ-9 Score 5 8 10 4 9   Difficult doing work/chores Somewhat difficult Not difficult at all Somewhat difficult Not difficult at all Not difficult at all   Interpretation of Total Score  Total Score Depression Severity:  1-4 = Minimal depression, 5-9 = Mild depression, 10-14 = Moderate depression, 15-19 = Moderately severe depression, 20-27 = Severe depression   Psychosocial Evaluation and Intervention:  Psychosocial Evaluation - 11/07/22 1020       Psychosocial Evaluation & Interventions   Comments Ed has no barriers to attending the prgram. He has been in program before and wants to work on his leg strength and his balance. He is in PT at this time for balance concerns. He states no concerns with stress. He is working with his physician over his medication. He is ready to start    Expected Outcomes STGAttend all scheduled sessions, work on exercise progression while concentrating on leg strength and improved balance LTG Continues working on exercie progression after discharge    Continue Psychosocial Services  Follow up required by staff  Psychosocial Re-Evaluation:  Psychosocial Re-Evaluation     Row Name 01/15/23 0948 03/14/23 1558           Psychosocial Re-Evaluation   Current issues with Current Sleep Concerns Current Sleep Concerns      Comments Reports his sleep has not been good, taking medication and says it helps him. Otherwise he reports no stress or anxiety or depression. Reports he is complinat with his anti-depressant medication Ed still has trouble sleeping, he was prescribed sleeping medication but states that it made him hallucinate so he stopped. HE claims that he has no current stressors at this time. He states that he still takes his anti-depressant medication.      Expected Outcomes Short: focus on maintain regular sleep habits and mental health. Long: Maintain positvie attitude Short: focus on maintain regular  sleep habits and mental health. Long: Maintain positvie attitude      Interventions Encouraged to attend Cardiac Rehabilitation for the exercise Encouraged to attend Cardiac Rehabilitation for the exercise      Continue Psychosocial Services  Follow up required by staff --               Psychosocial Discharge (Final Psychosocial Re-Evaluation):  Psychosocial Re-Evaluation - 03/14/23 1558       Psychosocial Re-Evaluation   Current issues with Current Sleep Concerns    Comments Ed still has trouble sleeping, he was prescribed sleeping medication but states that it made him hallucinate so he stopped. HE claims that he has no current stressors at this time. He states that he still takes his anti-depressant medication.    Expected Outcomes Short: focus on maintain regular sleep habits and mental health. Long: Maintain positvie attitude    Interventions Encouraged to attend Cardiac Rehabilitation for the exercise             Vocational Rehabilitation: Provide vocational rehab assistance to qualifying candidates.   Vocational Rehab Evaluation & Intervention:  Vocational Rehab - 11/07/22 1019       Initial Vocational Rehab Evaluation & Intervention   Assessment shows need for Vocational Rehabilitation No      Vocational Rehab Re-Evaulation   Comments retired             Education: Education Goals: Education classes will be provided on a variety of topics geared toward better understanding of heart health and risk factor modification. Participant will state understanding/return demonstration of topics presented as noted by education test scores.  Learning Barriers/Preferences:  Learning Barriers/Preferences - 11/07/22 1008       Learning Barriers/Preferences   Learning Barriers None    Learning Preferences None             General Cardiac Education Topics:  AED/CPR: - Group verbal and written instruction with the use of models to demonstrate the basic use of  the AED with the basic ABC's of resuscitation.   Anatomy and Cardiac Procedures: - Group verbal and visual presentation and models provide information about basic cardiac anatomy and function. Reviews the testing methods done to diagnose heart disease and the outcomes of the test results. Describes the treatment choices: Medical Management, Angioplasty, or Coronary Bypass Surgery for treating various heart conditions including Myocardial Infarction, Angina, Valve Disease, and Cardiac Arrhythmias.  Written material given at graduation. Flowsheet Row Cardiac Rehab from 11/19/2022 in [facility] Cardiac and Pulmonary Rehab  Education need identified 11/19/22       Medication Safety: - Group verbal and visual instruction to review commonly prescribed medications for heart  and lung disease. Reviews the medication, class of the drug, and side effects. Includes the steps to properly store meds and maintain the prescription regimen.  Written material given at graduation. Flowsheet Row Cardiac Rehab from 10/07/2019 in [facility] Cardiac and Pulmonary Rehab  Date 10/07/19  Educator SB  Instruction Review Code 1- Verbalizes Understanding       Intimacy: - Group verbal instruction through game format to discuss how heart and lung disease can affect sexual intimacy. Written material given at graduation..   Know Your Numbers and Heart Failure: - Group verbal and visual instruction to discuss disease risk factors for cardiac and pulmonary disease and treatment options.  Reviews associated critical values for Overweight/Obesity, Hypertension, Cholesterol, and Diabetes.  Discusses basics of heart failure: signs/symptoms and treatments.  Introduces Heart Failure Zone chart for action plan for heart failure.  Written material given at graduation.   Infection Prevention: - Provides verbal and written material to individual with discussion of infection control including proper hand washing and proper equipment cleaning  during exercise session. Flowsheet Row Cardiac Rehab from 11/19/2022 in [facility] Cardiac and Pulmonary Rehab  Date 11/19/22  Educator NT  Instruction Review Code 1- Verbalizes Understanding       Falls Prevention: - Provides verbal and written material to individual with discussion of falls prevention and safety. Flowsheet Row Cardiac Rehab from 11/19/2022 in [facility] Cardiac and Pulmonary Rehab  Date 11/19/22  Educator NT  Instruction Review Code 1- Verbalizes Understanding       Other: -Provides group and verbal instruction on various topics (see comments)   Knowledge Questionnaire Score:  Knowledge Questionnaire Score - 11/19/22 1429       Knowledge Questionnaire Score   Pre Score 21/26             Core Components/Risk Factors/Patient Goals at Admission:  Personal Goals and Risk Factors at Admission - 11/07/22 1008       Core Components/Risk Factors/Patient Goals on Admission    Weight Management Yes;Weight Maintenance    Intervention Weight Management: Develop a combined nutrition and exercise program designed to reach desired caloric intake, while maintaining appropriate intake of nutrient and fiber, sodium and fats, and appropriate energy expenditure required for the weight goal.    Admit Weight 204 lb (92.5 kg)    Goal Weight: Long Term 204 lb (92.5 kg)    Expected Outcomes Short Term: Continue to assess and modify interventions until short term weight is achieved;Long Term: Adherence to nutrition and physical activity/exercise program aimed toward attainment of established weight goal;Weight Maintenance: Understanding of the daily nutrition guidelines, which includes 25-35% calories from fat, 7% or less cal from saturated fats, less than 200mg  cholesterol, less than 1.5gm of sodium, & 5 or more servings of fruits and vegetables daily    Diabetes Yes    Intervention Provide education about signs/symptoms and action to take for hypo/hyperglycemia.;Provide education about  proper nutrition, including hydration, and aerobic/resistive exercise prescription along with prescribed medications to achieve blood glucose in normal ranges: Fasting glucose 65-99 mg/dL    Expected Outcomes Short Term: Participant verbalizes understanding of the signs/symptoms and immediate care of hyper/hypoglycemia, proper foot care and importance of medication, aerobic/resistive exercise and nutrition plan for blood glucose control.;Long Term: Attainment of HbA1C < 7%.    Heart Failure Yes    Intervention Provide a combined exercise and nutrition program that is supplemented with education, support and counseling about heart failure. Directed toward relieving symptoms such as shortness of breath, decreased exercise  tolerance, and extremity edema.    Expected Outcomes Improve functional capacity of life;Short term: Attendance in program 2-3 days a week with increased exercise capacity. Reported lower sodium intake. Reported increased fruit and vegetable intake. Reports medication compliance.;Short term: Daily weights obtained and reported for increase. Utilizing diuretic protocols set by physician.;Long term: Adoption of self-care skills and reduction of barriers for early signs and symptoms recognition and intervention leading to self-care maintenance.    Hypertension Yes    Intervention Provide education on lifestyle modifcations including regular physical activity/exercise, weight management, moderate sodium restriction and increased consumption of fresh fruit, vegetables, and low fat dairy, alcohol moderation, and smoking cessation.;Monitor prescription use compliance.    Expected Outcomes Short Term: Continued assessment and intervention until BP is < 140/99mm HG in hypertensive participants. < 130/60mm HG in hypertensive participants with diabetes, heart failure or chronic kidney disease.;Long Term: Maintenance of blood pressure at goal levels.    Lipids Yes    Intervention Provide education and  support for participant on nutrition & aerobic/resistive exercise along with prescribed medications to achieve LDL 70mg , HDL >40mg .    Expected Outcomes Short Term: Participant states understanding of desired cholesterol values and is compliant with medications prescribed. Participant is following exercise prescription and nutrition guidelines.;Long Term: Cholesterol controlled with medications as prescribed, with individualized exercise RX and with personalized nutrition plan. Value goals: LDL < 70mg , HDL > 40 mg.             Education:Diabetes - Individual verbal and written instruction to review signs/symptoms of diabetes, desired ranges of glucose level fasting, after meals and with exercise. Acknowledge that pre and post exercise glucose checks will be done for 3 sessions at entry of program. Flowsheet Row Cardiac Rehab from 01/13/2020 in [facility] Cardiac and Pulmonary Rehab  Date 01/13/20  Educator [facility]  Instruction Review Code 1- Verbalizes Understanding       Core Components/Risk Factors/Patient Goals Review:   Goals and Risk Factor Review     Row Name 01/15/23 0951 03/14/23 1611           Core Components/Risk Factors/Patient Goals Review   Personal Goals Review Weight Management/Obesity;Hypertension;Diabetes;Lipids Weight Management/Obesity;Hypertension;Diabetes;Lipids      Review He continue to work on watching his weight, checking his blood sugars now back on metformin. He is checking his blood pressure at home and it has been stable He continues to strive for a heart healthy diet and continue to get exercise in order to manage his weight. Ed is still taking his blood pressure daily, and checking his blood sugar, he is currently experiencing high blood sugar and is meeting with his doctor about that soon.      Expected Outcomes Short: continue to check blood sugars and blood pressure. Long: Work towards healthy weight and more stable A1C and blood pressures Short: continue to check  blood sugars and blood pressure. Long: Work towards healthy weight and more stable A1C and blood pressures               Core Components/Risk Factors/Patient Goals at Discharge (Final Review):   Goals and Risk Factor Review - 03/14/23 1611       Core Components/Risk Factors/Patient Goals Review   Personal Goals Review Weight Management/Obesity;Hypertension;Diabetes;Lipids    Review He continues to strive for a heart healthy diet and continue to get exercise in order to manage his weight. Ed is still taking his blood pressure daily, and checking his blood sugar, he is currently experiencing high blood sugar  and is meeting with his doctor about that soon.    Expected Outcomes Short: continue to check blood sugars and blood pressure. Long: Work towards healthy weight and more stable A1C and blood pressures             ITP Comments:  ITP Comments     Row Name 11/07/22 1019 11/19/22 1426 11/27/22 0939 12/12/22 1229 01/09/23 1516   ITP Comments Virtual orientation call completed today. he has an appointment on Date: [id number]  for EP eval and gym Orientation.  Documentation of diagnosis [facility] be found in [facility]  Date: 10/13/2022  Letter sent for AAA parameters. Completed and gym orientation. Initial ITP created and sent for review to Dr. Daniel Nones, Medical Director. First full day of exercise!  Patient was oriented to gym and equipment including functions, settings, policies, and procedures.  Patient's individual exercise prescription and treatment plan were reviewed.  All starting workloads were established based on the results of the 6 minute walk test done at initial orientation visit.  The plan for exercise progression was also introduced and progression will be customized based on patient's performance and goals. AAA parameters  set by his physician explained to patient. 30 Day review completed. Medical Director ITP review done, changes made as directed, and signed approval by Medical  Director.    new to program 30 Day review completed. Medical Director ITP review done, changes made as directed, and signed approval by Medical Director.    Row Name 01/30/23 1115 02/27/23 1134 03/27/23 1459       ITP Comments 30 Day review completed. Medical Director ITP review done, changes made as directed, and signed approval by Medical Director. 30 Day review completed. Medical Director ITP review done, changes made as directed, and signed approval by Medical Director. 30 Day review completed. Medical Director ITP review done, changes made as directed, and signed approval by Medical Director.              Comments: 30 day review

## 2023-04-26 ENCOUNTER — Other Ambulatory Visit: Payer: Self-pay | Admitting: *Deleted

## 2023-04-26 ENCOUNTER — Ambulatory Visit: Payer: Medicare HMO | Admitting: Cardiovascular Disease

## 2023-04-26 ENCOUNTER — Encounter: Payer: Self-pay | Admitting: Cardiovascular Disease

## 2023-04-26 VITALS — BP 128/72 | HR 66 | Ht 72.0 in | Wt 213.0 lb

## 2023-04-26 DIAGNOSIS — I5020 Unspecified systolic (congestive) heart failure: Secondary | ICD-10-CM

## 2023-04-26 DIAGNOSIS — R0789 Other chest pain: Secondary | ICD-10-CM | POA: Diagnosis not present

## 2023-04-26 DIAGNOSIS — Z0130 Encounter for examination of blood pressure without abnormal findings: Secondary | ICD-10-CM

## 2023-04-26 DIAGNOSIS — I739 Peripheral vascular disease, unspecified: Secondary | ICD-10-CM

## 2023-04-26 DIAGNOSIS — R0602 Shortness of breath: Secondary | ICD-10-CM

## 2023-04-26 DIAGNOSIS — I25700 Atherosclerosis of coronary artery bypass graft(s), unspecified, with unstable angina pectoris: Secondary | ICD-10-CM

## 2023-04-26 DIAGNOSIS — I2510 Atherosclerotic heart disease of native coronary artery without angina pectoris: Secondary | ICD-10-CM

## 2023-04-26 DIAGNOSIS — I4891 Unspecified atrial fibrillation: Secondary | ICD-10-CM

## 2023-04-26 MED ORDER — RANOLAZINE ER 500 MG PO TB12: 500.0000 mg | ORAL_TABLET | Freq: Two times a day (BID) | ORAL | 3 refills | Status: DC

## 2023-04-26 NOTE — Progress Notes (Signed)
Cardiology Office Note   Date:  04/26/2023   ID:  Devin [name]., DOB [birth year], MRN [id number]  PCP:  Devin Housekeeper, MD  Cardiologist:  Devin Blackwater, MD      History of Present Illness: Devin [name]. is a 78 y.o. male who presents for  Chief Complaint  Patient presents with   Follow-up    Hospital follow up     This is a 78 year old white male with a past medical history of having PCI and stenting of left circumflex and 8/24 and [facility] after having non-STEMI.  Patient was had a 90% lesion which was reduced to 0%.  Patient however developed recurrent chest pain at rest not abating with nitroglycerin and went to initially [facility] where he had cardiac catheterization followed by PCI and angioplasty of the left circumflex stent at [facility].  Currently patient denies any chest pain or shortness of breath.  Right groin shows no evidence of hematoma just some ecchymosis and no bruit.      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   Hypertension    Ischemic cardiomyopathy    MI, old    Peripheral neuropathy    PFO (patent foramen ovale) 05/2019   s/p repair   Sleep apnea    Spinal stenosis of lumbar region    TIA (transient ischemic attack)      Past Surgical History:  Procedure Laterality Date   APPENDECTOMY     BREAST SURGERY Right    benign mass   CARDIOVERSION Right 12/15/2019   Procedure: CARDIOVERSION;  Surgeon: Devin Nancy, MD;  Location: [facility] ORS;  Service: Cardiovascular;  Laterality: Right;   CATARACT EXTRACTION, BILATERAL     COLONOSCOPY     CORONARY ANGIOPLASTY WITH  STENT PLACEMENT     CORONARY ARTERY BYPASS GRAFT  05/2019   LIMA-LAD   CORONARY STENT INTERVENTION N/A 10/15/2022   Procedure: CORONARY STENT INTERVENTION;  Surgeon: Devin Millard, MD;  Location: [facility] INVASIVE CV LAB;  Service: Cardiovascular;  Laterality: N/A;   LEFT HEART CATH N/A 10/15/2022   Procedure: Left Heart Cath;  Surgeon: Devin Millard, MD;  Location: [facility] INVASIVE CV LAB;  Service: Cardiovascular;  Laterality: N/A;   LEFT HEART CATH AND CORS/GRAFTS ANGIOGRAPHY N/A 05/21/2019   Procedure: LEFT HEART CATH AND CORONARY ANGIOGRAPHY;  Surgeon: Devin Nancy, MD;  Location: [facility] INVASIVE CV LAB;  Service: Cardiovascular;  Laterality: N/A;   LUMBAR LAMINECTOMY/DECOMPRESSION MICRODISCECTOMY N/A 07/04/2020   Procedure: L4-5 LAMINECTOMY;  Surgeon: Devin Chris, MD;  Location: [facility] ORS;  Service: Neurosurgery;  Laterality: N/A;   REPLACEMENT TOTAL KNEE BILATERAL       Current Outpatient Medications  Medication Sig Dispense Refill   albuterol (VENTOLIN HFA) 108 (90 Base) MCG/ACT inhaler Inhale 2 puffs into the lungs every 6 (six) hours as needed for wheezing or shortness of breath.     amiodarone (PACERONE) 200 MG tablet Take 200 mg by mouth daily.     amLODipine (NORVASC) 10 MG tablet Take 1 tablet by mouth daily.  apixaban (ELIQUIS) 5 MG TABS tablet Take 1 tablet (5 mg total) by mouth 2 (two) times daily. 60 tablet 3   atorvastatin (LIPITOR) 80 MG tablet Take 80 mg by mouth daily.     buPROPion (WELLBUTRIN XL) 300 MG 24 hr tablet Take 300 mg by mouth daily.     clopidogrel (PLAVIX) 75 MG tablet Take 1 tablet (75 mg total) by mouth daily with breakfast. 30 tablet 11   ezetimibe (ZETIA) 10 MG tablet Take 10 mg by mouth daily.     gabapentin (NEURONTIN) 300 MG capsule Take 300 mg by mouth 2 (two) times daily.     metFORMIN (GLUCOPHAGE) 500 MG tablet Take 1,000 mg by mouth 2 (two) times daily.     metoprolol succinate (TOPROL XL) 50 MG 24 hr tablet Take 1 tablet (50 mg total)  by mouth daily. Take with or immediately following a meal. 30 tablet 11   pantoprazole (PROTONIX) 40 MG tablet TAKE 1 TABLET BY MOUTH EVERY DAY 30 tablet 1   ranolazine (RANEXA) 500 MG 12 hr tablet Take 1 tablet (500 mg total) by mouth 2 (two) times daily. 60 tablet 3   sacubitril-valsartan (ENTRESTO) 97-103 MG TAKE 1 TABLET BY MOUTH TWICE A DAY 180 tablet 1   cephALEXin (KEFLEX) 500 MG capsule Take 500 mg by mouth 3 (three) times daily. (Patient not taking: Reported on 04/26/2023)     docusate (COLACE) 60 MG/15ML syrup Take 60 mg by mouth daily.     EPINEPHrine 0.3 mg/0.3 mL IJ SOAJ injection Inject 0.3 mg into the muscle as needed for anaphylaxis.     fluticasone (FLONASE) 50 MCG/ACT nasal spray SPRAY 2 SPRAYS INTO EACH NOSTRIL EVERY DAY     loratadine (CLARITIN) 10 MG tablet Take 10 mg by mouth daily.     nitroGLYCERIN (NITROSTAT) 0.4 MG SL tablet Place 0.4 mg under the tongue every 5 (five) minutes as needed for chest pain.     No current facility-administered medications for this visit.    Allergies:   Morphine, Yellow jacket venom [bee venom], and Trazodone    Social History:   reports that he has quit smoking. He has never used smokeless tobacco. He reports that he does not currently use alcohol. He reports that he does not use drugs.   Family History:  family history includes Osteoarthritis in his mother; Other (age of onset: 73) in his father.    ROS:     Review of Systems  Constitutional: Negative.   HENT: Negative.    Eyes: Negative.   Respiratory: Negative.    Gastrointestinal: Negative.   Genitourinary: Negative.   Musculoskeletal: Negative.   Skin: Negative.   Neurological: Negative.   Endo/Heme/Allergies: Negative.   Psychiatric/Behavioral: Negative.    All other systems reviewed and are negative.     All other systems are reviewed and negative.    PHYSICAL EXAM: VS:  BP 128/72   Pulse 66   Ht 6' (1.829 m)   Wt 213 lb (96.6 kg)   SpO2 98%   BMI 28.89  kg/m  , BMI Body mass index is 28.89 kg/m. Last weight:  Wt Readings from Last 3 Encounters:  04/26/23 213 lb (96.6 kg)  01/18/23 213 lb 3.2 oz (96.7 kg)  12/07/22 208 lb 3.2 oz (94.4 kg)     Physical Exam Vitals reviewed.  Constitutional:      Appearance: Normal appearance. He is normal weight.  HENT:     Head: Normocephalic.     Nose:  Nose normal.     Mouth/Throat:     Mouth: Mucous membranes are moist.  Eyes:     Pupils: Pupils are equal, round, and reactive to light.  Cardiovascular:     Rate and Rhythm: Normal rate and regular rhythm.     Pulses: Normal pulses.     Heart sounds: Normal heart sounds.  Pulmonary:     Effort: Pulmonary effort is normal.  Abdominal:     General: Abdomen is flat. Bowel sounds are normal.  Musculoskeletal:        General: Normal range of motion.     Cervical back: Normal range of motion.  Skin:    General: Skin is warm.  Neurological:     General: No focal deficit present.     Mental Status: He is alert.  Psychiatric:        Mood and Affect: Mood normal.       EKG:   Recent Labs: 10/16/2022: BUN 10; Creatinine, Ser 0.89; Hemoglobin 13.8; Magnesium 2.3; Platelets 336; Potassium 4.1; Sodium 139    Lipid Panel    Component Value Date/Time   CHOL 105 10/13/2022 0313   TRIG 55 10/13/2022 0313   HDL 59 10/13/2022 0313   CHOLHDL 1.8 10/13/2022 0313   VLDL 11 10/13/2022 0313   LDLCALC 35 10/13/2022 0313      Other studies Reviewed: Additional studies/ records that were reviewed today include:  Review of the above records demonstrates:       No data to display            ASSESSMENT AND PLAN:    ICD-10-CM   1. Other chest pain  R07.89 ranolazine (RANEXA) 500 MG 12 hr tablet    2. Coronary artery disease involving native coronary artery of native heart without angina pectoris  I25.10 ranolazine (RANEXA) 500 MG 12 hr tablet   Status post restenosis of the left circumflex lesion which was done on 8/24 at [facility], required  PCI of the stent and now is feeling much better.  Add Ranexa500bid    3. SOB (shortness of breath)  R06.02 ranolazine (RANEXA) 500 MG 12 hr tablet    4. HFrEF (heart failure with reduced ejection fraction) (HCC)  I50.20     5. Coronary artery disease involving coronary bypass graft with unstable angina pectoris, unspecified whether native or transplanted heart (HCC)  I25.700     6. PAD (peripheral artery disease) (HCC)  I73.9     7. Atrial fibrillation status post cardioversion [facility])  I48.91        Problem List Items Addressed This Visit       Cardiovascular and Mediastinum   CAD (coronary artery disease)   Relevant Medications   ranolazine (RANEXA) 500 MG 12 hr tablet   Atrial fibrillation status post cardioversion [facility])   Relevant Medications   ranolazine (RANEXA) 500 MG 12 hr tablet   PAD (peripheral artery disease) (HCC)   Relevant Medications   ranolazine (RANEXA) 500 MG 12 hr tablet   HFrEF (heart failure with reduced ejection fraction) (HCC)   Relevant Medications   ranolazine (RANEXA) 500 MG 12 hr tablet     Other   Chest pain - Primary   Relevant Medications   ranolazine (RANEXA) 500 MG 12 hr tablet   Other Visit Diagnoses       SOB (shortness of breath)       Relevant Medications   ranolazine (RANEXA) 500 MG 12 hr tablet  Disposition:   Return in about 6 weeks (around 06/07/2023).    Total time spent: 40 minutes  Signed,  Devin Blackwater, MD  04/26/2023 3:33 PM    [facility]

## 2023-04-29 ENCOUNTER — Telehealth: Payer: Self-pay | Admitting: Family Medicine

## 2023-04-29 ENCOUNTER — Encounter: Payer: Medicare HMO | Admitting: *Deleted

## 2023-04-29 ENCOUNTER — Telehealth: Payer: Self-pay | Admitting: Cardiovascular Disease

## 2023-04-29 DIAGNOSIS — I214 Non-ST elevation (NSTEMI) myocardial infarction: Secondary | ICD-10-CM | POA: Diagnosis not present

## 2023-04-29 DIAGNOSIS — Z955 Presence of coronary angioplasty implant and graft: Secondary | ICD-10-CM

## 2023-04-29 NOTE — Progress Notes (Signed)
Daily Session Note  Patient Details  Name: Nehemyah Foushee [facility]. MRN: [id number] Date of Birth: [birth year] Referring Provider:   Flowsheet Row Cardiac Rehab from 11/19/2022 in [facility] Cardiac and Pulmonary Rehab  Referring Provider Dr. Adrian Blackwater MD       Encounter Date: 04/29/2023  Check In:  Session Check In - 04/29/23 1605       Check-In   Supervising physician immediately available to respond to emergencies See telemetry face sheet for immediately available ER MD    Location [facility]-Cardiac & Pulmonary Rehab    Staff Present Susann Givens RN,BSN;Maxon Manya Silvas BS, Exercise Physiologist;Kelly Cloretta Ned, ACSM CEP, Exercise Physiologist;Joseph Reino Kent RCP,RRT,BSRT    Virtual Visit No    Medication changes reported     No    Fall or balance concerns reported    No    Warm-up and Cool-down Performed on first and last piece of equipment    Resistance Training Performed Yes    VAD Patient? No    PAD/SET Patient? No      Pain Assessment   Currently in Pain? No/denies                Social History   Tobacco Use  Smoking Status Former  Smokeless Tobacco Never  Tobacco Comments   Quit over 40 years ago    Goals Met:  Independence with exercise equipment Exercise tolerated well No report of concerns or symptoms today Strength training completed today  Goals Unmet:  Not Applicable  Comments: Pt able to follow exercise prescription today without complaint.  Will continue to monitor for progression.    Dr. Bethann Punches is Medical Director for [facility] Cardiac Rehabilitation.  Dr. Vida Rigger is Medical Director for [facility] Pulmonary Rehabilitation.

## 2023-04-29 NOTE — Telephone Encounter (Signed)
PT does Cardiac rehab over at [facility] & they stated that they need authorization for him to be able to continue to do the cardiac rehab Fax # [phone]

## 2023-04-29 NOTE — Telephone Encounter (Signed)
 Entered in error

## 2023-04-30 ENCOUNTER — Other Ambulatory Visit: Payer: Self-pay

## 2023-04-30 DIAGNOSIS — I2510 Atherosclerotic heart disease of native coronary artery without angina pectoris: Secondary | ICD-10-CM

## 2023-04-30 DIAGNOSIS — R0789 Other chest pain: Secondary | ICD-10-CM

## 2023-04-30 DIAGNOSIS — R0602 Shortness of breath: Secondary | ICD-10-CM

## 2023-04-30 MED ORDER — RANOLAZINE ER 500 MG PO TB12
500.0000 mg | ORAL_TABLET | Freq: Two times a day (BID) | ORAL | 3 refills | Status: DC
Start: 2023-04-30 — End: 2023-07-30

## 2023-05-02 ENCOUNTER — Encounter: Payer: Medicare HMO | Admitting: *Deleted

## 2023-05-02 DIAGNOSIS — I214 Non-ST elevation (NSTEMI) myocardial infarction: Secondary | ICD-10-CM

## 2023-05-02 DIAGNOSIS — Z955 Presence of coronary angioplasty implant and graft: Secondary | ICD-10-CM

## 2023-05-02 NOTE — Progress Notes (Signed)
Daily Session Note  Patient Details  Name: Devin [name] [facility] MRN: [id number] Date of Birth: [birth year] Referring Provider:   Flowsheet Row Cardiac Rehab from 11/19/2022 in [facility] Cardiac and Pulmonary Rehab  Referring Provider Dr. Adrian Blackwater MD       Encounter Date: 05/02/2023  Check In:  Session Check In - 05/02/23 1544       Check-In   Supervising physician immediately available to respond to emergencies See telemetry face sheet for immediately available ER MD    Location [facility]-Cardiac & Pulmonary Rehab    Staff Present Cora Collum, RN, BSN, CCRP;Margaret Best, MS, Exercise Physiologist;Maxon Conetta BS, Exercise Physiologist    Virtual Visit No    Medication changes reported     No    Fall or balance concerns reported    No    Warm-up and Cool-down Performed on first and last piece of equipment    Resistance Training Performed Yes    VAD Patient? No    PAD/SET Patient? No      Pain Assessment   Currently in Pain? No/denies                Social History   Tobacco Use  Smoking Status Former  Smokeless Tobacco Never  Tobacco Comments   Quit over 40 years ago    Goals Met:  Independence with exercise equipment Exercise tolerated well No report of concerns or symptoms today  Goals Unmet:  Not Applicable  Comments: Pt able to follow exercise prescription today without complaint.  Will continue to monitor for progression.    Dr. Bethann Punches is Medical Director for [facility] Cardiac Rehabilitation.  Dr. Vida Rigger is Medical Director for [facility] Pulmonary Rehabilitation.

## 2023-05-06 ENCOUNTER — Encounter: Payer: Medicare HMO | Admitting: *Deleted

## 2023-05-06 DIAGNOSIS — I214 Non-ST elevation (NSTEMI) myocardial infarction: Secondary | ICD-10-CM | POA: Diagnosis not present

## 2023-05-06 DIAGNOSIS — Z955 Presence of coronary angioplasty implant and graft: Secondary | ICD-10-CM

## 2023-05-06 NOTE — Progress Notes (Signed)
 Daily Session Note  Patient Details  Name: Devin [name] [facility]. MRN: [id number] Date of Birth: 02/07/1946 Referring Provider:   Flowsheet Row Cardiac Rehab from 11/19/2022 in [facility] Cardiac and Pulmonary Rehab  Referring Provider Dr. Adrian Blackwater MD       Encounter Date: 05/06/2023  Check In:  Session Check In - 05/06/23 1600       Check-In   Supervising physician immediately available to respond to emergencies See telemetry face sheet for immediately available ER MD    Location [facility]-Cardiac & Pulmonary Rehab    Staff Present Susann Givens RN,BSN;Joseph [facility] BS, Exercise Physiologist    Virtual Visit No    Medication changes reported     No    Fall or balance concerns reported    No    Warm-up and Cool-down Performed on first and last piece of equipment    Resistance Training Performed Yes    VAD Patient? No    PAD/SET Patient? No      Pain Assessment   Currently in Pain? No/denies                Social History   Tobacco Use  Smoking Status Former  Smokeless Tobacco Never  Tobacco Comments   Quit over 40 years ago    Goals Met:  Independence with exercise equipment Exercise tolerated well No report of concerns or symptoms today Strength training completed today  Goals Unmet:  Not Applicable  Comments: Pt able to follow exercise prescription today without complaint.  Will continue to monitor for progression.    Dr. Bethann Punches is Medical Director for [facility] Cardiac Rehabilitation.  Dr. Vida Rigger is Medical Director for [facility] Pulmonary Rehabilitation.

## 2023-05-09 ENCOUNTER — Encounter: Payer: Medicare HMO | Admitting: *Deleted

## 2023-05-09 VITALS — Ht 72.0 in | Wt 222.0 lb

## 2023-05-09 DIAGNOSIS — Z955 Presence of coronary angioplasty implant and graft: Secondary | ICD-10-CM

## 2023-05-09 DIAGNOSIS — I214 Non-ST elevation (NSTEMI) myocardial infarction: Secondary | ICD-10-CM

## 2023-05-09 NOTE — Patient Instructions (Signed)
 Discharge Patient Instructions  Patient Details  Name: Devin [name] [facility]. MRN: [id number] Date of Birth: 1945/08/21 Referring Provider:  Dione Housekeeper, *  Number of Visits: 36/36  Reason for Discharge:  Patient reached a stable level of exercise. Patient independent in their exercise. Patient has met program and personal goals. Diagnosis:  NSTEMI (non-ST elevation myocardial infarction) [facility])  Status post coronary artery stent placement  Initial Exercise Prescription:  Initial Exercise Prescription - 11/19/22 1400       Date of Initial Exercise RX and Referring Provider   Date 11/19/22    Referring Provider Dr. Adrian Blackwater MD      Oxygen   Maintain Oxygen Saturation 88% or higher      Treadmill   MPH 1    Grade 0    Minutes 15    METs 1.8      NuStep   Level 1    SPM 80    Minutes 15    METs 1.02      Arm Ergometer   Level 1    RPM 50    Minutes 15    METs 1.02      Track   Laps 16    Minutes 15    METs 1.87      Prescription Details   Frequency (times per week) 2    Duration Progress to 30 minutes of continuous aerobic without signs/symptoms of physical distress      Intensity   THRR 40-80% of Max Heartrate 90-124    Ratings of Perceived Exertion 11-13    Perceived Dyspnea 0-4      Progression   Progression Continue to progress workloads to maintain intensity without signs/symptoms of physical distress.      Resistance Training   Training Prescription Yes    Weight 4 lb    Reps 10-15            Discharge Exercise Prescription (Final Exercise Prescription Changes):  Exercise Prescription Changes - 05/01/23 1000       Response to Exercise   Blood Pressure (Admit) 116/60    Blood Pressure (Exit) 134/64    Heart Rate (Admit) 62 bpm    Heart Rate (Exercise) 118 bpm    Heart Rate (Exit) 88 bpm    Rating of Perceived Exertion (Exercise) 13    Symptoms none    Duration Continue with 30 min of aerobic exercise without  signs/symptoms of physical distress.    Intensity THRR unchanged      Progression   Progression Continue to progress workloads to maintain intensity without signs/symptoms of physical distress.    Average METs 2.15      Resistance Training   Training Prescription Yes    Weight 4 lb    Reps 10-15      Interval Training   Interval Training No      NuStep   Level 3   T6 nustep   Minutes 30    METs 2.2      Home Exercise Plan   Plans to continue exercise at Home (comment)   walking   Frequency Add 2 additional days to program exercise sessions.    Initial Home Exercises Provided 02/11/23      Oxygen   Maintain Oxygen Saturation 88% or higher            Functional Capacity:  6 Minute Walk     Row Name 11/19/22 1440 05/09/23 1603       6 Minute  Walk   Phase Initial Discharge    Distance 620 feet 720 feet    Distance % Change -- 16 %    Distance Feet Change -- 100 ft    Walk Time 6 minutes 6 minutes    # of Rest Breaks 0 0    MPH 1.17 1.36    METS 1.02 1.13    RPE 11 13    Perceived Dyspnea  0 1    VO2 Peak 3.59 3.96    Symptoms No No    Resting HR 56 bpm 65 bpm    Resting BP 128/68 110/62    Resting Oxygen Saturation  97 % 95 %    Exercise Oxygen Saturation  during 6 min walk 96 % 95 %    Max Ex. HR 68 bpm 92 bpm    Max Ex. BP 138/76 122/64    2 Minute Post BP 136/70 --            Nutrition & Weight - Outcomes:  Pre Biometrics - 11/19/22 1443       Pre Biometrics   Height 5' 11.5" (1.816 m)    Weight 205 lb 3.2 oz (93.1 kg)    Waist Circumference 43 inches    Hip Circumference 42 inches    Waist to Hip Ratio 1.02 %    BMI (Calculated) 28.22    Single Leg Stand 1.2 seconds             Post Biometrics - 05/09/23 1605        Post  Biometrics   Height 6' (1.829 m)    Weight 222 lb (100.7 kg)    BMI (Calculated) 30.1    Single Leg Stand 2 seconds            Nutrition:  Nutrition Therapy & Goals - 11/27/22 1131       Nutrition  Therapy   Diet Carb controlled, Cardiac, low na    Protein (specify units) 90    Fiber 30 grams    Whole Grain Foods 3 servings    Saturated Fats 15 max. grams    Fruits and Vegetables 5 servings/day    Sodium 2 grams      Personal Nutrition Goals   Nutrition Goal Eat a protein at every meal and pair it with a carb    Personal Goal #2 Use protein snacks if needed    Personal Goal #3 Use sweets smartly, and make good snack choices    Comments patient drinking >64oz of water most days. Rarely drinks sugary drinks. He eats 3 meals per day. Has veteran in-home assistance to help him make meals. Likes to go out to restaurants for social interaction, but tries to make good food choices. Reviewed mediterranean diet handout. Educated on types of fats, sources, and how to read labels. He reports he doesn't use salt at home, commended him and educated about sodium limits, how processed foods can have lots in them before arriving to the table. Reviewed his 24hr food recall, encouraged more protein. Recommended greek yogurt, cottage cheese, hard boiled eggs, tuna, chicken, premier protein shakes as ways to boost protein intake. Build out several meals and snacks with foods he likes and will eat, focusing on adequate protein intake, healthy fats and colorful plates with controlled portions of carbs.      Intervention Plan   Intervention Prescribe, educate and counsel regarding individualized specific dietary modifications aiming towards targeted core components such as weight, hypertension, lipid management,  diabetes, heart failure and other comorbidities.;Nutrition handout(s) given to patient.    Expected Outcomes Long Term Goal: Adherence to prescribed nutrition plan.;Short Term Goal: A plan has been developed with personal nutrition goals set during dietitian appointment.;Short Term Goal: Understand basic principles of dietary content, such as calories, fat, sodium, cholesterol and nutrients.

## 2023-05-09 NOTE — Progress Notes (Signed)
 Daily Session Note  Patient Details  Name: Devin [name]. MRN: [id number] Date of Birth: [birth year] Referring Provider:   Flowsheet Row Cardiac Rehab from 11/19/2022 in [facility] Cardiac and Pulmonary Rehab  Referring Provider Dr. Adrian Blackwater MD       Encounter Date: 05/09/2023  Check In:  Session Check In - 05/09/23 1534       Check-In   Supervising physician immediately available to respond to emergencies See telemetry face sheet for immediately available ER MD    Location [facility]-Cardiac & Pulmonary Rehab    Staff Present Susann Givens RN,BSN;Joseph Weyman [place], [place], Exercise Physiologist    Virtual Visit No    Medication changes reported     No    Fall or balance concerns reported    No    Warm-up and Cool-down Performed on first and last piece of equipment    Resistance Training Performed Yes    VAD Patient? No    PAD/SET Patient? No      Pain Assessment   Currently in Pain? No/denies                Social History   Tobacco Use  Smoking Status Former  Smokeless Tobacco Never  Tobacco Comments   Quit over 40 years ago    Goals Met:  Independence with exercise equipment Exercise tolerated well No report of concerns or symptoms today Strength training completed today  Goals Unmet:  Not Applicable  Comments:   6 Minute Walk     Row Name 11/19/22 1440 05/09/23 1603       6 Minute Walk   Phase Initial Discharge    Distance 620 feet 720 feet    Distance % Change -- 16 %    Distance Feet Change -- 100 ft    Walk Time 6 minutes 6 minutes    # of Rest Breaks 0 0    MPH 1.17 1.36    METS 1.02 1.13    RPE 11 13    Perceived Dyspnea  0 1    VO2 Peak 3.59 3.96    Symptoms No No    Resting HR 56 bpm 65 bpm    Resting BP 128/68 110/62    Resting Oxygen Saturation  97 % 95 %    Exercise Oxygen Saturation  during 6 min walk 96 % 95 %    Max Ex. HR 68 bpm 92 bpm    Max Ex. BP 138/76 122/64    2 Minute Post BP 136/70 --             Pt able to follow exercise prescription today without complaint.  Will continue to monitor for progression.    Dr. Bethann Punches is Medical Director for [facility] Cardiac Rehabilitation.  Dr. Vida Rigger is Medical Director for [facility] Pulmonary Rehabilitation.

## 2023-05-13 ENCOUNTER — Encounter: Payer: Medicare HMO | Attending: Cardiovascular Disease | Admitting: *Deleted

## 2023-05-13 DIAGNOSIS — I214 Non-ST elevation (NSTEMI) myocardial infarction: Secondary | ICD-10-CM

## 2023-05-13 DIAGNOSIS — Z48812 Encounter for surgical aftercare following surgery on the circulatory system: Secondary | ICD-10-CM | POA: Insufficient documentation

## 2023-05-13 DIAGNOSIS — Z955 Presence of coronary angioplasty implant and graft: Secondary | ICD-10-CM | POA: Diagnosis present

## 2023-05-13 DIAGNOSIS — I252 Old myocardial infarction: Secondary | ICD-10-CM | POA: Insufficient documentation

## 2023-05-13 NOTE — Progress Notes (Signed)
 Daily Session Note  Patient Details  Name: Devin [name] [facility]. MRN: [id number] Date of Birth: 04-01-1945 Referring Provider:   Flowsheet Row Cardiac Rehab from 11/19/2022 in [facility] Cardiac and Pulmonary Rehab  Referring Provider Dr. Adrian Blackwater MD       Encounter Date: 05/13/2023  Check In:  Session Check In - 05/13/23 1604       Check-In   Supervising physician immediately available to respond to emergencies See telemetry face sheet for immediately available ER MD    Location [facility]-Cardiac & Pulmonary Rehab    Staff Present Susann Givens RN,BSN;Joseph Hollace Kinnier;Betsy Coder PhD, RN,CNS,CEN    Virtual Visit No    Medication changes reported     No    Fall or balance concerns reported    No    Warm-up and Cool-down Performed on first and last piece of equipment    Resistance Training Performed Yes    VAD Patient? No    PAD/SET Patient? No      Pain Assessment   Currently in Pain? No/denies                Social History   Tobacco Use  Smoking Status Former  Smokeless Tobacco Never  Tobacco Comments   Quit over 40 years ago    Goals Met:  Independence with exercise equipment Exercise tolerated well No report of concerns or symptoms today Strength training completed today  Goals Unmet:  Not Applicable  Comments: Pt able to follow exercise prescription today without complaint.  Will continue to monitor for progression.    Dr. Bethann Punches is Medical Director for [facility] Cardiac Rehabilitation.  Dr. Vida Rigger is Medical Director for [facility] Pulmonary Rehabilitation.

## 2023-05-14 ENCOUNTER — Ambulatory Visit: Payer: Medicare HMO | Admitting: Cardiovascular Disease

## 2023-05-16 ENCOUNTER — Ambulatory Visit: Payer: Medicare HMO | Admitting: Cardiovascular Disease

## 2023-05-16 ENCOUNTER — Encounter: Payer: Medicare HMO | Admitting: *Deleted

## 2023-05-16 DIAGNOSIS — Z955 Presence of coronary angioplasty implant and graft: Secondary | ICD-10-CM

## 2023-05-16 DIAGNOSIS — Z48812 Encounter for surgical aftercare following surgery on the circulatory system: Secondary | ICD-10-CM | POA: Diagnosis not present

## 2023-05-16 DIAGNOSIS — I214 Non-ST elevation (NSTEMI) myocardial infarction: Secondary | ICD-10-CM

## 2023-05-16 NOTE — Progress Notes (Signed)
 Discharge Summary   Devin [name] DOB: 12/08/1945  Devin [name] graduated today from  rehab with 36 sessions completed.  Details of the patient's exercise prescription and what He needs to do in order to continue the prescription and progress were discussed with patient.  Patient was given a copy of prescription and goals.  Patient verbalized understanding. Devin [name] plans to continue to exercise by attending Cardiac Rehab under a new referral after his newest stent.   6 Minute Walk     Row Name 11/19/22 1440 05/09/23 1603       6 Minute Walk   Phase Initial Discharge    Distance 620 feet 720 feet    Distance % Change -- 16 %    Distance Feet Change -- 100 ft    Walk Time 6 minutes 6 minutes    # of Rest Breaks 0 0    MPH 1.17 1.36    METS 1.02 1.13    RPE 11 13    Perceived Dyspnea  0 1    VO2 Peak 3.59 3.96    Symptoms No No    Resting HR 56 bpm 65 bpm    Resting BP 128/68 110/62    Resting Oxygen Saturation  97 % 95 %    Exercise Oxygen Saturation  during 6 min walk 96 % 95 %    Max Ex. HR 68 bpm 92 bpm    Max Ex. BP 138/76 122/64    2 Minute Post BP 136/70 --

## 2023-05-16 NOTE — Progress Notes (Signed)
 Cardiac Individual Treatment Plan  Patient Details  Name: Devin [name] [facility]. MRN: [id number] Date of Birth: 20-Nov-1945 Referring Provider:   Flowsheet Row Cardiac Rehab from 11/19/2022 in [facility] Cardiac and Pulmonary Rehab  Referring Provider Dr. Adrian Blackwater MD       Initial Encounter Date:  Flowsheet Row Cardiac Rehab from 11/19/2022 in [facility] Cardiac and Pulmonary Rehab  Date 11/19/22       Visit Diagnosis: NSTEMI (non-ST elevation myocardial infarction) [facility])  Status post coronary artery stent placement  Patient's Home Medications on Admission:  Current Outpatient Medications:    albuterol (VENTOLIN HFA) 108 (90 Base) MCG/ACT inhaler, Inhale 2 puffs into the lungs every 6 (six) hours as needed for wheezing or shortness of breath., Disp: , Rfl:    amiodarone (PACERONE) 200 MG tablet, Take 200 mg by mouth daily., Disp: , Rfl:    amLODipine (NORVASC) 10 MG tablet, Take 1 tablet by mouth daily., Disp: , Rfl:    apixaban (ELIQUIS) 5 MG TABS tablet, Take 1 tablet (5 mg total) by mouth 2 (two) times daily., Disp: 60 tablet, Rfl: 3   atorvastatin (LIPITOR) 80 MG tablet, Take 80 mg by mouth daily., Disp: , Rfl:    buPROPion (WELLBUTRIN XL) 300 MG 24 hr tablet, Take 300 mg by mouth daily., Disp: , Rfl:    cephALEXin (KEFLEX) 500 MG capsule, Take 500 mg by mouth 3 (three) times daily. (Patient not taking: Reported on 04/26/2023), Disp: , Rfl:    clopidogrel (PLAVIX) 75 MG tablet, Take 1 tablet (75 mg total) by mouth daily with breakfast., Disp: 30 tablet, Rfl: 11   docusate (COLACE) 60 MG/15ML syrup, Take 60 mg by mouth daily., Disp: , Rfl:    EPINEPHrine 0.3 mg/0.3 mL IJ SOAJ injection, Inject 0.3 mg into the muscle as needed for anaphylaxis., Disp: , Rfl:    ezetimibe (ZETIA) 10 MG tablet, Take 10 mg by mouth daily., Disp: , Rfl:    fluticasone (FLONASE) 50 MCG/ACT nasal spray, SPRAY 2 SPRAYS INTO EACH NOSTRIL EVERY DAY, Disp: , Rfl:    gabapentin (NEURONTIN) 300 MG capsule, Take 300 mg  by mouth 2 (two) times daily., Disp: , Rfl:    loratadine (CLARITIN) 10 MG tablet, Take 10 mg by mouth daily., Disp: , Rfl:    metFORMIN (GLUCOPHAGE) 500 MG tablet, Take 1,000 mg by mouth 2 (two) times daily., Disp: , Rfl:    metoprolol succinate (TOPROL XL) 50 MG 24 hr tablet, Take 1 tablet (50 mg total) by mouth daily. Take with or immediately following a meal., Disp: 30 tablet, Rfl: 11   nitroGLYCERIN (NITROSTAT) 0.4 MG SL tablet, Place 0.4 mg under the tongue every 5 (five) minutes as needed for chest pain., Disp: , Rfl:    pantoprazole (PROTONIX) 40 MG tablet, TAKE 1 TABLET BY MOUTH EVERY DAY, Disp: 30 tablet, Rfl: 1   ranolazine (RANEXA) 500 MG 12 hr tablet, Take 1 tablet (500 mg total) by mouth 2 (two) times daily., Disp: 60 tablet, Rfl: 3   sacubitril-valsartan (ENTRESTO) 97-103 MG, TAKE 1 TABLET BY MOUTH TWICE A DAY, Disp: 180 tablet, Rfl: 1  Past Medical History: Past Medical History:  Diagnosis Date   AAA (abdominal aortic aneurysm) (HCC)    Anxiety    Aortic atherosclerosis (HCC)    Arthritis    Atrial fibrillation (HCC)    CAD (coronary artery disease)    CHF (congestive heart failure) (HCC)    Current use of long term anticoagulation    Apixaban  Depression    Diabetes mellitus without complication (HCC)    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   Hypertension    Ischemic cardiomyopathy    MI, old    Peripheral neuropathy    PFO (patent foramen ovale) 05/2019   s/p repair   Sleep apnea    Spinal stenosis of lumbar region    TIA (transient ischemic attack)     Tobacco Use: Social History   Tobacco Use  Smoking Status Former  Smokeless Tobacco Never  Tobacco Comments   Quit over 40 years ago    Labs: Review Flowsheet  More data exists      Latest Ref Rng & Units 04/08/2020 07/04/2020 04/12/2022 04/13/2022 10/13/2022  Labs for ITP Cardiac and Pulmonary Rehab  Cholestrol 0 - 200 mg/dL - - - - 161   LDL (calc) 0 - 99 mg/dL - - - - 35   HDL-C >09 mg/dL - - - - 59    Trlycerides <150 mg/dL - - - - 55   Hemoglobin A1c 4.8 - 5.6 % 5.8  - - 5.5  6.3   Bicarbonate 20.0 - 28.0 mmol/L - - 23.9  - -  TCO2 22 - 32 mmol/L - 24  - - -  Acid-base deficit 0.0 - 2.0 mmol/L - - 0.1  - -  O2 Saturation % - - 90.3  - -     Exercise Target Goals: Exercise Program Goal: Individual exercise prescription set using results from initial 6 min walk test and THRR while considering  patient's activity barriers and safety.   Exercise Prescription Goal: Initial exercise prescription builds to 30-45 minutes a day of aerobic activity, 2-3 days per week.  Home exercise guidelines will be given to patient during program as part of exercise prescription that the participant will acknowledge.   Education: Aerobic Exercise: - Group verbal and visual presentation on the components of exercise prescription. Introduces F.I.T.T principle from ACSM for exercise prescriptions.  Reviews F.I.T.T. principles of aerobic exercise including progression. Written material given at graduation. Flowsheet Row Cardiac Rehab from 11/19/2022 in [facility] Cardiac and Pulmonary Rehab  Education need identified 11/19/22       Education: Resistance Exercise: - Group verbal and visual presentation on the components of exercise prescription. Introduces F.I.T.T principle from ACSM for exercise prescriptions  Reviews F.I.T.T. principles of resistance exercise including progression. Written material given at graduation.    Education: Exercise & Equipment Safety: - Individual verbal instruction and demonstration of equipment use and safety with use of the equipment. Flowsheet Row Cardiac Rehab from 11/19/2022 in [facility] Cardiac and Pulmonary Rehab  Date 11/19/22  Educator NT  Instruction Review Code 1- Verbalizes Understanding       Education: Exercise Physiology & General Exercise Guidelines: - Group verbal and written instruction with models to review the exercise physiology of the cardiovascular system and  associated critical values. Provides general exercise guidelines with specific guidelines to those with heart or lung disease.  Flowsheet Row Cardiac Rehab from 10/07/2019 in [facility] Cardiac and Pulmonary Rehab  Date 09/23/19  Educator AS  Instruction Review Code 1- Verbalizes Understanding       Education: Flexibility, Balance, Mind/Body Relaxation: - Group verbal and visual presentation with interactive activity on the components of exercise prescription. Introduces F.I.T.T principle from ACSM for exercise prescriptions. Reviews F.I.T.T. principles of flexibility and balance exercise training including progression. Also discusses the mind body connection.  Reviews various relaxation techniques to help reduce and manage stress (i.e. Deep  breathing, progressive muscle relaxation, and visualization). Balance handout provided to take home. Written material given at graduation.   Activity Barriers & Risk Stratification:  Activity Barriers & Cardiac Risk Stratification - 11/19/22 1442       Activity Barriers & Cardiac Risk Stratification   Activity Barriers Right Knee Replacement;Left Knee Replacement;Muscular Weakness;Balance Concerns;Other (comment)    Comments drop foot, impaired gait, neuropathy    Cardiac Risk Stratification Moderate             6 Minute Walk:  6 Minute Walk     Row Name 11/19/22 1440 05/09/23 1603       6 Minute Walk   Phase Initial Discharge    Distance 620 feet 720 feet    Distance % Change -- 16 %    Distance Feet Change -- 100 ft    Walk Time 6 minutes 6 minutes    # of Rest Breaks 0 0    MPH 1.17 1.36    METS 1.02 1.13    RPE 11 13    Perceived Dyspnea  0 1    VO2 Peak 3.59 3.96    Symptoms No No    Resting HR 56 bpm 65 bpm    Resting BP 128/68 110/62    Resting Oxygen Saturation  97 % 95 %    Exercise Oxygen Saturation  during 6 min walk 96 % 95 %    Max Ex. HR 68 bpm 92 bpm    Max Ex. BP 138/76 122/64    2 Minute Post BP 136/70 --              Oxygen Initial Assessment:   Oxygen Re-Evaluation:   Oxygen Discharge (Final Oxygen Re-Evaluation):   Initial Exercise Prescription:  Initial Exercise Prescription - 11/19/22 1400       Date of Initial Exercise RX and Referring Provider   Date 11/19/22    Referring Provider Dr. Adrian Blackwater MD      Oxygen   Maintain Oxygen Saturation 88% or higher      Treadmill   MPH 1    Grade 0    Minutes 15    METs 1.8      NuStep   Level 1    SPM 80    Minutes 15    METs 1.02      Arm Ergometer   Level 1    RPM 50    Minutes 15    METs 1.02      Track   Laps 16    Minutes 15    METs 1.87      Prescription Details   Frequency (times per week) 2    Duration Progress to 30 minutes of continuous aerobic without signs/symptoms of physical distress      Intensity   THRR 40-80% of Max Heartrate 90-124    Ratings of Perceived Exertion 11-13    Perceived Dyspnea 0-4      Progression   Progression Continue to progress workloads to maintain intensity without signs/symptoms of physical distress.      Resistance Training   Training Prescription Yes    Weight 4 lb    Reps 10-15             Perform Capillary Blood Glucose checks as needed.  Exercise Prescription Changes:   Exercise Prescription Changes     Row Name 11/19/22 1400 12/13/22 0800 12/26/22 0800 01/09/23 0900 01/24/23 1400     Response to Exercise  Blood Pressure (Admit) 128/68 126/58 108/56 122/60 112/58   Blood Pressure (Exercise) 138/76 152/66 142/70 144/64 --   Blood Pressure (Exit) 136/70 124/68 108/60 118/60 124/64   Heart Rate (Admit) 56 bpm 69 bpm 74 bpm 74 bpm 104 bpm   Heart Rate (Exercise) 68 bpm 104 bpm 95 bpm 91 bpm 108 bpm   Heart Rate (Exit) 60 bpm 69 bpm 68 bpm 83 bpm 95 bpm   Oxygen Saturation (Admit) 97 % -- -- -- --   Oxygen Saturation (Exercise) 96 % -- -- -- --   Rating of Perceived Exertion (Exercise) 11 13 15 13 13    Perceived Dyspnea (Exercise) 0 -- -- -- 0    Symptoms none none none none none   Comments Results First two weeks of exercise -- -- --   Duration -- Continue with 30 min of aerobic exercise without signs/symptoms of physical distress. Continue with 30 min of aerobic exercise without signs/symptoms of physical distress. Continue with 30 min of aerobic exercise without signs/symptoms of physical distress. Continue with 30 min of aerobic exercise without signs/symptoms of physical distress.   Intensity -- THRR unchanged THRR unchanged THRR unchanged THRR unchanged     Progression   Progression -- Continue to progress workloads to maintain intensity without signs/symptoms of physical distress. Continue to progress workloads to maintain intensity without signs/symptoms of physical distress. Continue to progress workloads to maintain intensity without signs/symptoms of physical distress. Continue to progress workloads to maintain intensity without signs/symptoms of physical distress.   Average METs -- 2.29 2.09 2.53 --     Resistance Training   Training Prescription -- Yes Yes Yes Yes   Weight -- none  Pt's doctor did not give clearance for resistance training none  Pt's doctor did not give clearance for resistance training none  Pt's doctor did not give clearance for resistance training none  Pt's doctor did not give clearance for resistance training   Reps -- 10-15 10-15 10-15 10-15     Interval Training   Interval Training -- No No No No     Treadmill   MPH -- 1.7 1.2 -- --   Grade -- 0 0 -- --   Minutes -- 15 15 -- --   METs -- 2.3 1.92 -- --     NuStep   Level -- 4 4 4  --   Minutes -- 15 15 30  --   METs -- 3.1 2.7 3 --     Arm Ergometer   Level -- -- 1 -- --   Minutes -- -- 15 -- --   METs -- -- 1 -- --     T5 Nustep   Level -- -- -- 1 4   Minutes -- -- -- 15 15     Biostep-RELP   Level -- 1 -- -- --   Minutes -- 15 -- -- --   METs -- 2 -- -- --     Track   Laps -- 16 25 10 10    Minutes -- 15 15 15 15    METs --  1.87 2.36 1.54 --     Oxygen   Maintain Oxygen Saturation -- 88% or higher 88% or higher 88% or higher 88% or higher    Row Name 02/06/23 0800 02/11/23 1600 02/20/23 1500 03/05/23 0900 03/21/23 1600     Response to Exercise   Blood Pressure (Admit) 104/60 -- 102/58 138/60 106/60   Blood Pressure (Exit) 98/60 -- 100/54 98/52 118/60   Heart  Rate (Admit) 88 bpm -- 72 bpm 57 bpm 81 bpm   Heart Rate (Exercise) 97 bpm -- 85 bpm 81 bpm 112 bpm   Heart Rate (Exit) 75 bpm -- 82 bpm 60 bpm 59 bpm   Rating of Perceived Exertion (Exercise) 15 -- 15 15 15    Symptoms none -- none none none   Duration Continue with 30 min of aerobic exercise without signs/symptoms of physical distress. Continue with 30 min of aerobic exercise without signs/symptoms of physical distress. Continue with 30 min of aerobic exercise without signs/symptoms of physical distress. Continue with 30 min of aerobic exercise without signs/symptoms of physical distress. Continue with 30 min of aerobic exercise without signs/symptoms of physical distress.   Intensity THRR unchanged THRR unchanged THRR unchanged THRR unchanged THRR unchanged     Progression   Progression Continue to progress workloads to maintain intensity without signs/symptoms of physical distress. Continue to progress workloads to maintain intensity without signs/symptoms of physical distress. Continue to progress workloads to maintain intensity without signs/symptoms of physical distress. Continue to progress workloads to maintain intensity without signs/symptoms of physical distress. Continue to progress workloads to maintain intensity without signs/symptoms of physical distress.   Average METs 2.77 2.77 2.13 2.34 2.05     Resistance Training   Training Prescription Yes Yes Yes Yes Yes   Weight none  Pt's doctor did not give clearance for resistance training none  Pt's doctor did not give clearance for resistance training none  Pt's doctor did not give clearance for  resistance training none  Pt's doctor did not give clearance for resistance training none  Pt's doctor did not give clearance for resistance training   Reps 10-15 10-15 10-15 10-15 10-15     Interval Training   Interval Training No No No No No     NuStep   Level 5 5 4 3 4    Minutes 15 15 15 15 15    METs 2.6 2.6 2.6 3 2.5     T5 Nustep   Level -- -- -- 3  T6 --   Minutes -- -- -- 15 --   METs -- -- -- 2.2 --     Track   Laps 10 10 3 15 11    Minutes 15 15 15 15 15    METs 1.54 1.54 1.16 1.82 1.6     Home Exercise Plan   Plans to continue exercise at -- Home (comment)  walking Home (comment)  walking Home (comment)  walking Home (comment)  walking   Frequency -- Add 2 additional days to program exercise sessions. Add 2 additional days to program exercise sessions. Add 2 additional days to program exercise sessions. Add 2 additional days to program exercise sessions.   Initial Home Exercises Provided -- 02/11/23 02/11/23 02/11/23 02/11/23     Oxygen   Maintain Oxygen Saturation 88% or higher 88% or higher 88% or higher 88% or higher 88% or higher    Row Name 04/01/23 1600 04/16/23 1400 05/01/23 1000 05/15/23 0700       Response to Exercise   Blood Pressure (Admit) 102/56 92/58 116/60 110/62    Blood Pressure (Exit) 102/54 108/62 134/64 102/58    Heart Rate (Admit) 76 bpm 59 bpm 62 bpm 65 bpm    Heart Rate (Exercise) 87 bpm 107 bpm 118 bpm 97 bpm    Heart Rate (Exit) 74 bpm 71 bpm 88 bpm 63 bpm    Rating of Perceived Exertion (Exercise) 15 15 13  13  Symptoms none none none none    Duration Continue with 30 min of aerobic exercise without signs/symptoms of physical distress. Continue with 30 min of aerobic exercise without signs/symptoms of physical distress. Continue with 30 min of aerobic exercise without signs/symptoms of physical distress. Continue with 30 min of aerobic exercise without signs/symptoms of physical distress.    Intensity THRR unchanged THRR unchanged THRR  unchanged THRR unchanged      Progression   Progression Continue to progress workloads to maintain intensity without signs/symptoms of physical distress. Continue to progress workloads to maintain intensity without signs/symptoms of physical distress. Continue to progress workloads to maintain intensity without signs/symptoms of physical distress. Continue to progress workloads to maintain intensity without signs/symptoms of physical distress.    Average METs 1.99 2.1 2.15 2.04      Resistance Training   Training Prescription Yes Yes Yes Yes    Weight none  Pt's doctor did not give clearance for resistance training 4 lb 4 lb 4 lb    Reps 10-15 10-15 10-15 10-15      Interval Training   Interval Training No No No No      Treadmill   MPH 1 1.5 -- --    Grade 0 0 -- --    Minutes 15 15 -- --    METs 1.77 2.15 -- --      NuStep   Level 3  T6 nustep: level 3 4  T6: level 4 3  T6 nustep 4  T6: 3    Minutes 15 15 30 15     METs 2.3  T6 nustep: 1.9 METs 2.3  T6: 1.8 METs 2.2 2.3      T5 Nustep   Level -- -- -- 4    Minutes -- -- -- 15    METs -- -- -- 3      Track   Laps -- -- -- 15    Minutes -- -- -- 15    METs -- -- -- 1.82      Home Exercise Plan   Plans to continue exercise at Home (comment)  walking Home (comment)  walking Home (comment)  walking Home (comment)  walking    Frequency Add 2 additional days to program exercise sessions. Add 2 additional days to program exercise sessions. Add 2 additional days to program exercise sessions. Add 2 additional days to program exercise sessions.    Initial Home Exercises Provided 02/11/23 02/11/23 02/11/23 02/11/23      Oxygen   Maintain Oxygen Saturation 88% or higher 88% or higher 88% or higher 88% or higher             Exercise Comments:   Exercise Comments     Row Name 11/27/22 0941           Exercise Comments First full day of exercise!  Patient was oriented to gym and equipment including functions, settings,  policies, and procedures.  Patient's individual exercise prescription and treatment plan were reviewed.  All starting workloads were established based on the results of the 6 minute walk test done at initial orientation visit.  The plan for exercise progression was also introduced and progression will be customized based on patient's performance and goals.  AAA parameters  set by his physician explained to patient.                Exercise Goals and Review:   Exercise Goals     Row Name 11/19/22 [phone]  Exercise Goals   Increase Physical Activity Yes       Intervention Provide advice, education, support and counseling about physical activity/exercise needs.;Develop an individualized exercise prescription for aerobic and resistive training based on initial evaluation findings, risk stratification, comorbidities and participant's personal goals.       Expected Outcomes Long Term: Add in home exercise to make exercise part of routine and to increase amount of physical activity.;Long Term: Exercising regularly at least 3-5 days a week.;Short Term: Attend rehab on a regular basis to increase amount of physical activity.       Increase Strength and Stamina Yes       Intervention Provide advice, education, support and counseling about physical activity/exercise needs.;Develop an individualized exercise prescription for aerobic and resistive training based on initial evaluation findings, risk stratification, comorbidities and participant's personal goals.       Expected Outcomes Short Term: Increase workloads from initial exercise prescription for resistance, speed, and METs.;Short Term: Perform resistance training exercises routinely during rehab and add in resistance training at home;Long Term: Improve cardiorespiratory fitness, muscular endurance and strength as measured by increased METs and functional capacity ( )       Able to understand and use rate of perceived exertion (RPE)  scale Yes       Intervention Provide education and explanation on how to use RPE scale       Expected Outcomes Short Term: Able to use RPE daily in rehab to express subjective intensity level;Long Term:  Able to use RPE to guide intensity level when exercising independently       Able to understand and use Dyspnea scale Yes       Intervention Provide education and explanation on how to use Dyspnea scale       Expected Outcomes Short Term: Able to use Dyspnea scale daily in rehab to express subjective sense of shortness of breath during exertion;Long Term: Able to use Dyspnea scale to guide intensity level when exercising independently       Knowledge and understanding of Target Heart Rate Range (THRR) Yes       Intervention Provide education and explanation of THRR including how the numbers were predicted and where they are located for reference       Expected Outcomes Short Term: Able to state/look up THRR;Short Term: Able to use daily as guideline for intensity in rehab;Long Term: Able to use THRR to govern intensity when exercising independently       Able to check pulse independently Yes       Intervention Provide education and demonstration on how to check pulse in carotid and radial arteries.;Review the importance of being able to check your own pulse for safety during independent exercise       Expected Outcomes Short Term: Able to explain why pulse checking is important during independent exercise;Long Term: Able to check pulse independently and accurately       Understanding of Exercise Prescription Yes       Intervention Provide education, explanation, and written materials on patient's individual exercise prescription       Expected Outcomes Short Term: Able to explain program exercise prescription;Long Term: Able to explain home exercise prescription to exercise independently                Exercise Goals Re-Evaluation :  Exercise Goals Re-Evaluation     Row Name 11/27/22 0941  12/13/22 0818 12/26/22 0804 01/09/23 0941 01/15/23 0942     Exercise Goal Re-Evaluation  Exercise Goals Review Able to understand and use rate of perceived exertion (RPE) scale;Knowledge and understanding of Target Heart Rate Range (THRR);Understanding of Exercise Prescription;Able to understand and use Dyspnea scale Increase Physical Activity;Increase Strength and Stamina;Understanding of Exercise Prescription Increase Physical Activity;Increase Strength and Stamina;Understanding of Exercise Prescription Increase Physical Activity;Increase Strength and Stamina;Understanding of Exercise Prescription Increase Physical Activity;Increase Strength and Stamina;Understanding of Exercise Prescription   Comments Reviewed RPE  and dyspnea scale, THR and program prescription with pt today.  Pt voiced understanding and was given a copy of goals to take home. Devin [name] is off to a good start in the program. He has done well on the treadmill so far, as he increased his workload up to 1.7 mph with no incline. He also has done well at level one on the biostep and improved to level 4 on the T4 nustep. He has not any done any resistance training at this time due to not receiving clearance from his doctor. We will continue to monitor his progress in the program. Devin [name] is doing well in rehab. He has continued to walk the track and increased from 16 laps to 25 laps! He also has has continued to do well at level 4 on the T4 nustep and level 1 on the arm ergometer. He has not done any resistance training at this time due to doctor's restrictions. We will continue to monitor his progress in the program. Devin [name] is doing well in rehab. He has only walked the track once since the last review and was able to do 10 laps. He also continues to work at level 4 on the T4 nustep but was able to exercise for 30 minutes. He also began using the T5 nustep at level 1. We will continue to monitor his progress in the program. He is doing well at reahb on level 4  on T4 and T5. He goes to PT 2 times per week. Otherwise his exercise at home is limited. Encouraged him to continue to work on strength and balance.   Expected Outcomes Short: Use RPE daily to regulate intensity. Long: Follow program prescription in THR. Short:Continue to follow current exercise prescription. Long: Continue exercise to improve strength and stamina. Short: Increase workload on arm ergometer. Long: Continue exercise to improve strength and stamina. Short: Increase laps on the track back up to previous number. Long: Continue exercise to improve strength and stamina. Short: walk more at home, imporve strength and balance. Long: Continue to work on Adult nurse.    Row Name 01/24/23 1448 02/06/23 0814 02/11/23 1612 02/20/23 1550 03/05/23 0951     Exercise Goal Re-Evaluation   Exercise Goals Review Increase Physical Activity;Increase Strength and Stamina;Understanding of Exercise Prescription Increase Physical Activity;Increase Strength and Stamina;Understanding of Exercise Prescription Increase Physical Activity;Able to understand and use rate of perceived exertion (RPE) scale;Knowledge and understanding of Target Heart Rate Range (THRR);Understanding of Exercise Prescription;Increase Strength and Stamina;Able to understand and use Dyspnea scale;Able to check pulse independently Increase Physical Activity;Increase Strength and Stamina;Understanding of Exercise Prescription Increase Physical Activity;Increase Strength and Stamina;Understanding of Exercise Prescription   Comments Devin [name] continues to do well in rehab. He was recently able to increase his level on the T5 nustep from level 1 to 4. He was also able to maintain 10 track laps in 15 minutes. We will continue to monitor his progress in the program. Devin [name] has only attended two sessions since the last review. He continues to do well walking the track and has consistently reached 10 laps.  He also recently improved to level 5 on the T4 nustep.  We will continue to monitor his progress in the program. Reviewed home exercise with pt today.  Pt plans to walk and continue with physical therapy for exercise.  Reviewed THR, pulse, RPE, sign and symptoms, pulse oximetery and when to call 911 or MD.  Also discussed weather considerations and indoor options.  Pt voiced understanding. Devin [name] continues to attend rehab consistently. He has only walked the track once since the last review and was only able to do 3 laps. He also continues to work at level 4 on the T4 nustep. We will encourage him to continue to progressively increase his workloads in order to see progress in the program. We will continue to monitor his progress. Devin [name] is doing well in rehab. He was able to increase his laps on the track to 15. He worked at level 3 on the T4 and T6 nusteps. We are continuing to encourage him to progessively increase his workloads in order to see progress in the program. We will continue to monitor his progress in the program.   Expected Outcomes Short: Increase track laps, return to using the T4 nustep. Long: Continue exercise to improve strength and stamina. Short: Attend rehab more consistently. Long: Continue exercise to improve strength and stamina. Short: add 1-2 days a week of walking on off days of cardiac rehab and PT. Long: become independent with exercise routine. Short: Continue to progressively increase workloads and push for more laps on the track. Long: Continue exercise to improve strength and stamina. Short: Continue to progressively increase workloads on the track and nusteps. Long: Continue exercise to improve strength and stamina.    Row Name 03/14/23 1551 03/21/23 1607 04/01/23 1623 04/16/23 1440 05/01/23 1043     Exercise Goal Re-Evaluation   Exercise Goals Review Increase Physical Activity;Increase Strength and Stamina;Understanding of Exercise Prescription Increase Physical Activity;Increase Strength and Stamina;Understanding of Exercise  Prescription Increase Physical Activity;Increase Strength and Stamina;Understanding of Exercise Prescription Increase Physical Activity;Increase Strength and Stamina;Understanding of Exercise Prescription Increase Physical Activity;Increase Strength and Stamina;Understanding of Exercise Prescription   Comments Devin [name] continues to walk outside around his neighborhood on days that he doesnt come to rehab or PT. He states that he tries to do it when he feels comfortable with his balance. Devin [name] is doing well in rehab. He only uses one machine each time he comes but has done well walking the track and using the T4 nustep at level 4. He also has not done any resistance training due to lifting restrictions. We will continue to monitor his progress in the program. Devin [name] is doing well in rehab. He recently began walking the treadmill at a speed of 1 mph with no incline. He also continues to work at level 3 on both the T4 nustep and T6 nustep. We will continue to monitor his progress in the program. Devin [name] continues to do well in the program. He recently increased his treadmill speed to 1.5 mph. He also improved to level 4 on the T6 nustep and continues to work at level 4 on the T4 nustep. We will continue to monitor his progress in the program. Devin [name] has only attended one session since the last review. He continues to work on the T6 nustep at level 3 for 30 minutes. We will encourage him to attend rehab more consistently to see results from exercise. We will continue to monitor his progress in the program.   Expected Outcomes Short: Continue to exercise  at home. Long: Continue exercise to improve strength and stamina. Short: Continue to to push for more laps on the track. Long: Continue exercise to improve strength and stamina. Short: Increase to level 4 on the T4 and T6 nustep machines. Long: Continue exercise to improve strength and stamina. Short: Increase to level 5 on the T4 nustep. Long: Continue exercise to improve strength and  stamina. Short: Attend rehab more consistently. Long: Continue exercise to improve strength and stamina.    Row Name 05/15/23 0803             Exercise Goal Re-Evaluation   Exercise Goals Review Increase Physical Activity;Increase Strength and Stamina;Understanding of Exercise Prescription       Comments Devin [name] is doing well and is close to graduating from the program. He recently walked back up to 15 laps on the track and improved to level 4 on the T5 nustep. He also completed his post and improved by 16%. We will continue to monitor his progress until he graduates from the program.                Discharge Exercise Prescription (Final Exercise Prescription Changes):  Exercise Prescription Changes - 05/15/23 0700       Response to Exercise   Blood Pressure (Admit) 110/62    Blood Pressure (Exit) 102/58    Heart Rate (Admit) 65 bpm    Heart Rate (Exercise) 97 bpm    Heart Rate (Exit) 63 bpm    Rating of Perceived Exertion (Exercise) 13    Symptoms none    Duration Continue with 30 min of aerobic exercise without signs/symptoms of physical distress.    Intensity THRR unchanged      Progression   Progression Continue to progress workloads to maintain intensity without signs/symptoms of physical distress.    Average METs 2.04      Resistance Training   Training Prescription Yes    Weight 4 lb    Reps 10-15      Interval Training   Interval Training No      NuStep   Level 4   T6: 3   Minutes 15    METs 2.3      T5 Nustep   Level 4    Minutes 15    METs 3      Track   Laps 15    Minutes 15    METs 1.82      Home Exercise Plan   Plans to continue exercise at Home (comment)   walking   Frequency Add 2 additional days to program exercise sessions.    Initial Home Exercises Provided 02/11/23      Oxygen   Maintain Oxygen Saturation 88% or higher             Nutrition:  Target Goals: Understanding of nutrition guidelines, daily intake of sodium  1500mg , cholesterol 200mg , calories 30% from fat and 7% or less from saturated fats, daily to have 5 or more servings of fruits and vegetables.  Education: All About Nutrition: -Group instruction provided by verbal, written material, interactive activities, discussions, models, and posters to present general guidelines for heart healthy nutrition including fat, fiber, MyPlate, the role of sodium in heart healthy nutrition, utilization of the nutrition label, and utilization of this knowledge for meal planning. Follow up email sent as well. Written material given at graduation. Flowsheet Row Cardiac Rehab from 11/19/2022 in [facility] Cardiac and Pulmonary Rehab  Education need identified 11/19/22  Biometrics:  Pre Biometrics - 11/19/22 1443       Pre Biometrics   Height 5' 11.5" (1.816 m)    Weight 205 lb 3.2 oz (93.1 kg)    Waist Circumference 43 inches    Hip Circumference 42 inches    Waist to Hip Ratio 1.02 %    BMI (Calculated) 28.22    Single Leg Stand 1.2 seconds             Post Biometrics - 05/09/23 1605        Post  Biometrics   Height 6' (1.829 m)    Weight 222 lb (100.7 kg)    BMI (Calculated) 30.1    Single Leg Stand 2 seconds             Nutrition Therapy Plan and Nutrition Goals:  Nutrition Therapy & Goals - 11/27/22 1131       Nutrition Therapy   Diet Carb controlled, Cardiac, low na    Protein (specify units) 90    Fiber 30 grams    Whole Grain Foods 3 servings    Saturated Fats 15 max. grams    Fruits and Vegetables 5 servings/day    Sodium 2 grams      Personal Nutrition Goals   Nutrition Goal Eat a protein at every meal and pair it with a carb    Personal Goal #2 Use protein snacks if needed    Personal Goal #3 Use sweets smartly, and make good snack choices    Comments patient drinking >64oz of water most days. Rarely drinks sugary drinks. He eats 3 meals per day. Has veteran in-home assistance to help him make meals. Likes to go out to  restaurants for social interaction, but tries to make good food choices. Reviewed mediterranean diet handout. Educated on types of fats, sources, and how to read labels. He reports he doesn't use salt at home, commended him and educated about sodium limits, how processed foods can have lots in them before arriving to the table. Reviewed his 24hr food recall, encouraged more protein. Recommended greek yogurt, cottage cheese, hard boiled eggs, tuna, chicken, premier protein shakes as ways to boost protein intake. Build out several meals and snacks with foods he likes and will eat, focusing on adequate protein intake, healthy fats and colorful plates with controlled portions of carbs.      Intervention Plan   Intervention Prescribe, educate and counsel regarding individualized specific dietary modifications aiming towards targeted core components such as weight, hypertension, lipid management, diabetes, heart failure and other comorbidities.;Nutrition handout(s) given to patient.    Expected Outcomes Long Term Goal: Adherence to prescribed nutrition plan.;Short Term Goal: A plan has been developed with personal nutrition goals set during dietitian appointment.;Short Term Goal: Understand basic principles of dietary content, such as calories, fat, sodium, cholesterol and nutrients.             Nutrition Assessments:  MEDIFICTS Score Key: >=70 Need to make dietary changes  40-70 Heart Healthy Diet <= 40 Therapeutic Level Cholesterol Diet  Flowsheet Row Cardiac Rehab from 05/16/2023 in [facility] Cardiac and Pulmonary Rehab  Picture Your Plate Total Score on Discharge 64      Picture Your Plate Scores: <74 Unhealthy dietary pattern with much room for improvement. 41-50 Dietary pattern unlikely to meet recommendations for good health and room for improvement. 51-60 More healthful dietary pattern, with some room for improvement.  >60 Healthy dietary pattern, although there may be some specific behaviors  that  could be improved.    Nutrition Goals Re-Evaluation:  Nutrition Goals Re-Evaluation     Row Name 01/15/23 0945 03/14/23 1604           Goals   Comment He reports he is doing well with his foods, including more veggies and lean meats. Cutting back on fatty beef and fried foods. He reports he is snacking less and choosing more colorful produce. He states that he is eating relatively well. He said he struggles to find meals to cook for himself, so he often goes out, but he claims that he does not eat fried foods.      Expected Outcome Short: continue to limit fatty meats and sugary beverages. Long: Work towards maintain heart healthy diet Short: continue to limit fatty meats and sugary beverages. Long: Work towards maintain heart healthy diet               Nutrition Goals Discharge (Final Nutrition Goals Re-Evaluation):  Nutrition Goals Re-Evaluation - 03/14/23 1604       Goals   Comment He states that he is eating relatively well. He said he struggles to find meals to cook for himself, so he often goes out, but he claims that he does not eat fried foods.    Expected Outcome Short: continue to limit fatty meats and sugary beverages. Long: Work towards maintain heart healthy diet             Psychosocial: Target Goals: Acknowledge presence or absence of significant depression and/or stress, maximize coping skills, provide positive support system. Participant is able to verbalize types and ability to use techniques and skills needed for reducing stress and depression.   Education: Stress, Anxiety, and Depression - Group verbal and visual presentation to define topics covered.  Reviews how body is impacted by stress, anxiety, and depression.  Also discusses healthy ways to reduce stress and to treat/manage anxiety and depression.  Written material given at graduation.   Education: Sleep Hygiene -Provides group verbal and written instruction about how sleep can affect your  health.  Define sleep hygiene, discuss sleep cycles and impact of sleep habits. Review good sleep hygiene tips.    Initial Review & Psychosocial Screening:   Quality of Life Scores:   Quality of Life - 05/16/23 1550       Quality of Life   Select Quality of Life      Quality of Life Scores   Health/Function Pre 22.37 %    Health/Function Post 27.6 %    Health/Function % Change 23.38 %    Socioeconomic Pre 24.07 %    Socioeconomic Post 28 %    Socioeconomic % Change  16.33 %    Psych/Spiritual Pre 22 %    Psych/Spiritual Post 30 %    Psych/Spiritual % Change 36.36 %    Family Pre 30 %    Family Post 27 %    Family % Change -10 %    GLOBAL Pre 23.2 %    GLOBAL Post 28.13 %    GLOBAL % Change 21.25 %            Scores of 19 and below usually indicate a poorer quality of life in these areas.  A difference of  2-3 points is a clinically meaningful difference.  A difference of 2-3 points in the total score of the Quality of Life Index has been associated with significant improvement in overall quality of life, self-image, physical symptoms, and general health in studies  assessing change in quality of life.  PHQ-9: Review Flowsheet  More data exists      05/16/2023 11/19/2022 01/12/2020 10/28/2019 09/09/2019  Depression screen PHQ 2/9  Decreased Interest 0 1 3 2 1   Down, Depressed, Hopeless 0 1 0 0 0  PHQ - 2 Score 0 2 3 2 1   Altered sleeping 0 1 1 3 3   Tired, decreased energy 3 1 2 2  0  Change in appetite 3 1 1 1  0  Feeling bad or failure about yourself  0 0 0 0 0  Trouble concentrating 0 0 0 1 0  Moving slowly or fidgety/restless 0 0 0 1 0  Suicidal thoughts 0 0 1 0 0  PHQ-9 Score 6 5 8 10 4   Difficult doing work/chores Not difficult at all Somewhat difficult Not difficult at all Somewhat difficult Not difficult at all   Interpretation of Total Score  Total Score Depression Severity:  1-4 = Minimal depression, 5-9 = Mild depression, 10-14 = Moderate depression, 15-19 =  Moderately severe depression, 20-27 = Severe depression   Psychosocial Evaluation and Intervention:   Psychosocial Re-Evaluation:  Psychosocial Re-Evaluation     Row Name 01/15/23 0948 03/14/23 1558           Psychosocial Re-Evaluation   Current issues with Current Sleep Concerns Current Sleep Concerns      Comments Reports his sleep has not been good, taking medication and says it helps him. Otherwise he reports no stress or anxiety or depression. Reports he is complinat with his anti-depressant medication Devin [name] still has trouble sleeping, he was prescribed sleeping medication but states that it made him hallucinate so he stopped. HE claims that he has no current stressors at this time. He states that he still takes his anti-depressant medication.      Expected Outcomes Short: focus on maintain regular sleep habits and mental health. Long: Maintain positvie attitude Short: focus on maintain regular sleep habits and mental health. Long: Maintain positvie attitude      Interventions Encouraged to attend Cardiac Rehabilitation for the exercise Encouraged to attend Cardiac Rehabilitation for the exercise      Continue Psychosocial Services  Follow up required by staff --               Psychosocial Discharge (Final Psychosocial Re-Evaluation):  Psychosocial Re-Evaluation - 03/14/23 1558       Psychosocial Re-Evaluation   Current issues with Current Sleep Concerns    Comments Devin [name] still has trouble sleeping, he was prescribed sleeping medication but states that it made him hallucinate so he stopped. HE claims that he has no current stressors at this time. He states that he still takes his anti-depressant medication.    Expected Outcomes Short: focus on maintain regular sleep habits and mental health. Long: Maintain positvie attitude    Interventions Encouraged to attend Cardiac Rehabilitation for the exercise             Vocational Rehabilitation: Provide vocational rehab assistance  to qualifying candidates.   Vocational Rehab Evaluation & Intervention:   Education: Education Goals: Education classes will be provided on a variety of topics geared toward better understanding of heart health and risk factor modification. Participant will state understanding/return demonstration of topics presented as noted by education test scores.  Learning Barriers/Preferences:   General Cardiac Education Topics:  AED/CPR: - Group verbal and written instruction with the use of models to demonstrate the basic use of the AED with the basic ABC's of resuscitation.  Anatomy and Cardiac Procedures: - Group verbal and visual presentation and models provide information about basic cardiac anatomy and function. Reviews the testing methods done to diagnose heart disease and the outcomes of the test results. Describes the treatment choices: Medical Management, Angioplasty, or Coronary Bypass Surgery for treating various heart conditions including Myocardial Infarction, Angina, Valve Disease, and Cardiac Arrhythmias.  Written material given at graduation. Flowsheet Row Cardiac Rehab from 11/19/2022 in [facility] Cardiac and Pulmonary Rehab  Education need identified 11/19/22       Medication Safety: - Group verbal and visual instruction to review commonly prescribed medications for heart and lung disease. Reviews the medication, class of the drug, and side effects. Includes the steps to properly store meds and maintain the prescription regimen.  Written material given at graduation. Flowsheet Row Cardiac Rehab from 10/07/2019 in [facility] Cardiac and Pulmonary Rehab  Date 10/07/19  Educator SB  Instruction Review Code 1- Verbalizes Understanding       Intimacy: - Group verbal instruction through game format to discuss how heart and lung disease can affect sexual intimacy. Written material given at graduation..   Know Your Numbers and Heart Failure: - Group verbal and visual instruction to  discuss disease risk factors for cardiac and pulmonary disease and treatment options.  Reviews associated critical values for Overweight/Obesity, Hypertension, Cholesterol, and Diabetes.  Discusses basics of heart failure: signs/symptoms and treatments.  Introduces Heart Failure Zone chart for action plan for heart failure.  Written material given at graduation.   Infection Prevention: - Provides verbal and written material to individual with discussion of infection control including proper hand washing and proper equipment cleaning during exercise session. Flowsheet Row Cardiac Rehab from 11/19/2022 in [facility] Cardiac and Pulmonary Rehab  Date 11/19/22  Educator NT  Instruction Review Code 1- Verbalizes Understanding       Falls Prevention: - Provides verbal and written material to individual with discussion of falls prevention and safety. Flowsheet Row Cardiac Rehab from 11/19/2022 in [facility] Cardiac and Pulmonary Rehab  Date 11/19/22  Educator NT  Instruction Review Code 1- Verbalizes Understanding       Other: -Provides group and verbal instruction on various topics (see comments)   Knowledge Questionnaire Score:  Knowledge Questionnaire Score - 05/16/23 1550       Knowledge Questionnaire Score   Post Score 24/26             Core Components/Risk Factors/Patient Goals at Admission:   Education:Diabetes - Individual verbal and written instruction to review signs/symptoms of diabetes, desired ranges of glucose level fasting, after meals and with exercise. Acknowledge that pre and post exercise glucose checks will be done for 3 sessions at entry of program. Flowsheet Row Cardiac Rehab from 01/13/2020 in [facility] Cardiac and Pulmonary Rehab  Date 01/13/20  Educator [facility]  Instruction Review Code 1- Verbalizes Understanding       Core Components/Risk Factors/Patient Goals Review:   Goals and Risk Factor Review     Row Name 01/15/23 0951 03/14/23 1611           Core  Components/Risk Factors/Patient Goals Review   Personal Goals Review Weight Management/Obesity;Hypertension;Diabetes;Lipids Weight Management/Obesity;Hypertension;Diabetes;Lipids      Review He continue to work on watching his weight, checking his blood sugars now back on metformin. He is checking his blood pressure at home and it has been stable He continues to strive for a heart healthy diet and continue to get exercise in order to manage his weight. Devin [name] is still taking his  blood pressure daily, and checking his blood sugar, he is currently experiencing high blood sugar and is meeting with his doctor about that soon.      Expected Outcomes Short: continue to check blood sugars and blood pressure. Long: Work towards healthy weight and more stable A1C and blood pressures Short: continue to check blood sugars and blood pressure. Long: Work towards healthy weight and more stable A1C and blood pressures               Core Components/Risk Factors/Patient Goals at Discharge (Final Review):   Goals and Risk Factor Review - 03/14/23 1611       Core Components/Risk Factors/Patient Goals Review   Personal Goals Review Weight Management/Obesity;Hypertension;Diabetes;Lipids    Review He continues to strive for a heart healthy diet and continue to get exercise in order to manage his weight. Devin [name] is still taking his blood pressure daily, and checking his blood sugar, he is currently experiencing high blood sugar and is meeting with his doctor about that soon.    Expected Outcomes Short: continue to check blood sugars and blood pressure. Long: Work towards healthy weight and more stable A1C and blood pressures             ITP Comments:  ITP Comments     Row Name 11/19/22 1426 11/27/22 0939 12/12/22 1229 01/09/23 1516 01/30/23 1115   ITP Comments Completed and gym orientation. Initial ITP created and sent for review to Dr. Daniel Nones, Medical Director. First full day of exercise!  Patient was  oriented to gym and equipment including functions, settings, policies, and procedures.  Patient's individual exercise prescription and treatment plan were reviewed.  All starting workloads were established based on the results of the 6 minute walk test done at initial orientation visit.  The plan for exercise progression was also introduced and progression will be customized based on patient's performance and goals. AAA parameters  set by his physician explained to patient. 30 Day review completed. Medical Director ITP review done, changes made as directed, and signed approval by Medical Director.    new to program 30 Day review completed. Medical Director ITP review done, changes made as directed, and signed approval by Medical Director. 30 Day review completed. Medical Director ITP review done, changes made as directed, and signed approval by Medical Director.    Row Name 02/27/23 1134 03/27/23 1459 04/24/23 0800 05/16/23 1553     ITP Comments 30 Day review completed. Medical Director ITP review done, changes made as directed, and signed approval by Medical Director. 30 Day review completed. Medical Director ITP review done, changes made as directed, and signed approval by Medical Director. 30 Day review completed. Medical Director ITP review done, changes made as directed, and signed approval by Medical Director. Devin [name] graduated today from  rehab with 36 sessions completed.  Details of the patient's exercise prescription and what He needs to do in order to continue the prescription and progress were discussed with patient.  Patient was given a copy of prescription and goals.  Patient verbalized understanding. Sevag plans to continue to exercise by attending structured exercise program.             Comments: Discharge ITP

## 2023-05-16 NOTE — Progress Notes (Signed)
 Daily Session Note  Patient Details  Name: Devin [name]. MRN: [id number] Date of Birth: [birth year] Referring Provider:   Flowsheet Row Cardiac Rehab from 11/19/2022 in [name]  Referring Provider Dr. Adrian Blackwater MD       Encounter Date: 05/16/2023  Check In:  Session Check In - 05/16/23 1547       Check-In   Supervising physician immediately available to respond to emergencies See telemetry face sheet for immediately available ER MD    Location [facility]    Staff Present Susann Givens RN,BSN;Joseph Hollace Kinnier;Betsy Coder PhD, RN,CNS,CEN    Virtual Visit No    Medication changes reported     No    Fall or balance concerns reported    No    Warm-up and Cool-down Performed on first and last piece of equipment    Resistance Training Performed Yes    VAD Patient? No    PAD/SET Patient? No      Pain Assessment   Currently in Pain? No/denies                Social History   Tobacco Use  Smoking Status Former  Smokeless Tobacco Never  Tobacco Comments   Quit over 40 years ago    Goals Met:  Independence with exercise equipment Exercise tolerated well No report of concerns or symptoms today Strength training completed today  Goals Unmet:  Not Applicable  Comments:  Devin [name] graduated today from  rehab with 36 sessions completed.  Details of the patient's exercise prescription and what He needs to do in order to continue the prescription and progress were discussed with patient.  Patient was given a copy of prescription and goals.  Patient verbalized understanding. Devin [name] plans to continue to exercise by attending structured exercise program.    Dr. Bethann Punches is Medical Director for [name].  Dr. Vida Rigger is Medical Director for [facility].

## 2023-05-18 ENCOUNTER — Other Ambulatory Visit: Payer: Self-pay | Admitting: Cardiovascular Disease

## 2023-05-18 DIAGNOSIS — I5020 Unspecified systolic (congestive) heart failure: Secondary | ICD-10-CM

## 2023-05-18 DIAGNOSIS — I739 Peripheral vascular disease, unspecified: Secondary | ICD-10-CM

## 2023-05-18 DIAGNOSIS — I4891 Unspecified atrial fibrillation: Secondary | ICD-10-CM

## 2023-05-18 DIAGNOSIS — I25700 Atherosclerosis of coronary artery bypass graft(s), unspecified, with unstable angina pectoris: Secondary | ICD-10-CM

## 2023-05-18 DIAGNOSIS — I2510 Atherosclerotic heart disease of native coronary artery without angina pectoris: Secondary | ICD-10-CM

## 2023-05-20 ENCOUNTER — Encounter: Payer: Self-pay | Admitting: *Deleted

## 2023-05-20 ENCOUNTER — Encounter: Attending: Cardiovascular Disease | Admitting: *Deleted

## 2023-05-20 DIAGNOSIS — Z955 Presence of coronary angioplasty implant and graft: Secondary | ICD-10-CM

## 2023-05-20 NOTE — Progress Notes (Signed)
 Daily Session Note  Patient Details  Name: Devin [name] [facility]. MRN: [id number] Date of Birth: 03/06/1946 Referring Provider:   Flowsheet Row Cardiac Rehab from 11/19/2022 in [facility]  Referring Provider Dr. Adrian Blackwater MD       Encounter Date: 05/20/2023  Check In:  Session Check In - 05/20/23 1519       Check-In   Supervising physician immediately available to respond to emergencies See telemetry face sheet for immediately available ER MD    Location [facility]    Staff Present Susann Givens RN,BSN;Joseph Hollace Kinnier;Cora Collum, RN, BSN, CCRP    Virtual Visit No    Medication changes reported     No    Fall or balance concerns reported    No    Warm-up and Cool-down Performed on first and last piece of equipment    Resistance Training Performed Yes    VAD Patient? No    PAD/SET Patient? No      Pain Assessment   Currently in Pain? No/denies                Social History   Tobacco Use  Smoking Status Former  Smokeless Tobacco Never  Tobacco Comments   Quit over 40 years ago    Goals Met:  Independence with exercise equipment Exercise tolerated well No report of concerns or symptoms today Strength training completed today  Goals Unmet:  Not Applicable  Comments: First full day of exercise!  Patient was oriented to gym and equipment including functions, settings, policies, and procedures.  Patient's individual exercise prescription and treatment plan were reviewed.  All starting workloads were established based on the results of the 6 minute walk test done at initial orientation visit.  The plan for exercise progression was also introduced and progression will be customized based on patient's performance and goals.    Dr. Bethann Punches is Medical Director for [facility].  Dr. Vida Rigger is Medical Director for [facility].

## 2023-05-20 NOTE — Progress Notes (Signed)
 Cardiac Individual Treatment Plan  Patient Details  Name: Devin [name]. MRN: [id number] Date of Birth: Jun 06, 1945 Referring Provider:   Flowsheet Row Cardiac Rehab from 05/20/2023 in [facility] Cardiac and Pulmonary Rehab  Referring Provider Dr. Adrian Blackwater       Initial Encounter Date:  Flowsheet Row Cardiac Rehab from 05/20/2023 in [facility] Cardiac and Pulmonary Rehab  Date 05/20/23       Visit Diagnosis: Status post coronary artery stent placement  Patient's Home Medications on Admission:  Current Outpatient Medications:    albuterol (VENTOLIN HFA) 108 (90 Base) MCG/ACT inhaler, Inhale 2 puffs into the lungs every 6 (six) hours as needed for wheezing or shortness of breath., Disp: , Rfl:    amiodarone (PACERONE) 200 MG tablet, Take 200 mg by mouth daily., Disp: , Rfl:    amLODipine (NORVASC) 10 MG tablet, Take 1 tablet by mouth daily., Disp: , Rfl:    apixaban (ELIQUIS) 5 MG TABS tablet, Take 1 tablet (5 mg total) by mouth 2 (two) times daily., Disp: 60 tablet, Rfl: 3   atorvastatin (LIPITOR) 80 MG tablet, Take 80 mg by mouth daily., Disp: , Rfl:    buPROPion (WELLBUTRIN XL) 300 MG 24 hr tablet, Take 300 mg by mouth daily., Disp: , Rfl:    cephALEXin (KEFLEX) 500 MG capsule, Take 500 mg by mouth 3 (three) times daily. (Patient not taking: Reported on 04/26/2023), Disp: , Rfl:    clopidogrel (PLAVIX) 75 MG tablet, Take 1 tablet (75 mg total) by mouth daily with breakfast., Disp: 30 tablet, Rfl: 11   docusate (COLACE) 60 MG/15ML syrup, Take 60 mg by mouth daily., Disp: , Rfl:    EPINEPHrine 0.3 mg/0.3 mL IJ SOAJ injection, Inject 0.3 mg into the muscle as needed for anaphylaxis., Disp: , Rfl:    ezetimibe (ZETIA) 10 MG tablet, Take 10 mg by mouth daily., Disp: , Rfl:    fluticasone (FLONASE) 50 MCG/ACT nasal spray, SPRAY 2 SPRAYS INTO EACH NOSTRIL EVERY DAY, Disp: , Rfl:    gabapentin (NEURONTIN) 300 MG capsule, Take 300 mg by mouth 2 (two) times daily., Disp: , Rfl:    loratadine  (CLARITIN) 10 MG tablet, Take 10 mg by mouth daily., Disp: , Rfl:    metFORMIN (GLUCOPHAGE) 500 MG tablet, Take 1,000 mg by mouth 2 (two) times daily., Disp: , Rfl:    metoprolol succinate (TOPROL XL) 50 MG 24 hr tablet, Take 1 tablet (50 mg total) by mouth daily. Take with or immediately following a meal., Disp: 30 tablet, Rfl: 11   nitroGLYCERIN (NITROSTAT) 0.4 MG SL tablet, Place 0.4 mg under the tongue every 5 (five) minutes as needed for chest pain., Disp: , Rfl:    pantoprazole (PROTONIX) 40 MG tablet, TAKE 1 TABLET BY MOUTH EVERY DAY, Disp: 30 tablet, Rfl: 1   ranolazine (RANEXA) 500 MG 12 hr tablet, Take 1 tablet (500 mg total) by mouth 2 (two) times daily., Disp: 60 tablet, Rfl: 3   sacubitril-valsartan (ENTRESTO) 97-103 MG, TAKE 1 TABLET BY MOUTH TWICE A DAY, Disp: 180 tablet, Rfl: 1  Past Medical History: Past Medical History:  Diagnosis Date   AAA (abdominal aortic aneurysm) (HCC)    Anxiety    Aortic atherosclerosis (HCC)    Arthritis    Atrial fibrillation (HCC)    CAD (coronary artery disease)    CHF (congestive heart failure) (HCC)    Current use of long term anticoagulation    Apixaban   Depression    Diabetes mellitus without  complication (HCC)    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   Hypertension    Ischemic cardiomyopathy    MI, old    Peripheral neuropathy    PFO (patent foramen ovale) 05/2019   s/p repair   Sleep apnea    Spinal stenosis of lumbar region    TIA (transient ischemic attack)     Tobacco Use: Social History   Tobacco Use  Smoking Status Former  Smokeless Tobacco Never  Tobacco Comments   Quit over 40 years ago    Labs: Review Flowsheet  More data exists      Latest Ref Rng & Units 04/08/2020 07/04/2020 04/12/2022 04/13/2022 10/13/2022  Labs for ITP Cardiac and Pulmonary Rehab  Cholestrol 0 - 200 mg/dL - - - - 811   LDL (calc) 0 - 99 mg/dL - - - - 35   HDL-C >91 mg/dL - - - - 59   Trlycerides <150 mg/dL - - - - 55   Hemoglobin A1c 4.8 -  5.6 % 5.8  - - 5.5  6.3   Bicarbonate 20.0 - 28.0 mmol/L - - 23.9  - -  TCO2 22 - 32 mmol/L - 24  - - -  Acid-base deficit 0.0 - 2.0 mmol/L - - 0.1  - -  O2 Saturation % - - 90.3  - -     Exercise Target Goals: Exercise Program Goal: Individual exercise prescription set using results from initial 6 min walk test and THRR while considering  patient's activity barriers and safety.   Exercise Prescription Goal: Initial exercise prescription builds to 30-45 minutes a day of aerobic activity, 2-3 days per week.  Home exercise guidelines will be given to patient during program as part of exercise prescription that the participant will acknowledge.   Education: Aerobic Exercise: - Group verbal and visual presentation on the components of exercise prescription. Introduces F.I.T.T principle from ACSM for exercise prescriptions.  Reviews F.I.T.T. principles of aerobic exercise including progression. Written material given at graduation. Flowsheet Row Cardiac Rehab from 11/19/2022 in [facility] Cardiac and Pulmonary Rehab  Education need identified 11/19/22       Education: Resistance Exercise: - Group verbal and visual presentation on the components of exercise prescription. Introduces F.I.T.T principle from ACSM for exercise prescriptions  Reviews F.I.T.T. principles of resistance exercise including progression. Written material given at graduation.    Education: Exercise & Equipment Safety: - Individual verbal instruction and demonstration of equipment use and safety with use of the equipment. Flowsheet Row Cardiac Rehab from 11/19/2022 in [name] Cardiac and Pulmonary Rehab  Date 11/19/22  Educator NT  Instruction Review Code 1- Verbalizes Understanding       Education: Exercise Physiology & General Exercise Guidelines: - Group verbal and written instruction with models to review the exercise physiology of the cardiovascular system and associated critical values. Provides general exercise  guidelines with specific guidelines to those with heart or lung disease.  Flowsheet Row Cardiac Rehab from 10/07/2019 in [facility] Cardiac and Pulmonary Rehab  Date 09/23/19  Educator AS  Instruction Review Code 1- Verbalizes Understanding       Education: Flexibility, Balance, Mind/Body Relaxation: - Group verbal and visual presentation with interactive activity on the components of exercise prescription. Introduces F.I.T.T principle from ACSM for exercise prescriptions. Reviews F.I.T.T. principles of flexibility and balance exercise training including progression. Also discusses the mind body connection.  Reviews various relaxation techniques to help reduce and manage stress (i.e. Deep breathing, progressive muscle relaxation, and visualization). Balance  handout provided to take home. Written material given at graduation.   Activity Barriers & Risk Stratification:  Activity Barriers & Cardiac Risk Stratification - 05/20/23 1536       Activity Barriers & Cardiac Risk Stratification   Activity Barriers Right Knee Replacement;Left Knee Replacement;Muscular Weakness;Balance Concerns;Other (comment)    Comments drop foot, impaired gait, neuropathy    Cardiac Risk Stratification Moderate             6 Minute Walk:  6 Minute Walk     Row Name 05/09/23 1603 05/20/23 1535       6 Minute Walk   Phase Discharge Initial    Distance 720 feet 720 feet    Distance % Change 16 % --    Distance Feet Change 100 ft 100 ft    Walk Time 6 minutes 6 minutes    # of Rest Breaks 0 0    MPH 1.36 1.36    METS 1.13 1.13    RPE 13 13    Perceived Dyspnea  1 1    VO2 Peak 3.96 3.96    Symptoms No No    Resting HR 65 bpm 65 bpm    Resting BP 110/62 --    Resting Oxygen Saturation  95 % 95 %    Exercise Oxygen Saturation  during 6 min walk 95 % 95 %    Max Ex. HR 92 bpm 92 bpm    Max Ex. BP 122/64 122/64             Oxygen Initial Assessment:   Oxygen Re-Evaluation:   Oxygen Discharge  (Final Oxygen Re-Evaluation):   Initial Exercise Prescription:  Initial Exercise Prescription - 05/20/23 1500       Date of Initial Exercise RX and Referring Provider   Date 05/20/23    Referring Provider Dr. Adrian Blackwater      Oxygen   Maintain Oxygen Saturation 88% or higher      Treadmill   MPH 1      NuStep   Level 4   T6: 3   Minutes 15    METs 2.3      T5 Nustep   Level 4    Minutes 15    METs 3      Track   Laps 15    Minutes 15    METs 1.82      Resistance Training   Training Prescription Yes    Weight 4 lb    Reps 10-15             Perform Capillary Blood Glucose checks as needed.  Exercise Prescription Changes:   Exercise Prescription Changes     Row Name 04/01/23 1600 04/16/23 1400 05/01/23 1000 05/15/23 0700       Response to Exercise   Blood Pressure (Admit) 102/56 92/58 116/60 110/62    Blood Pressure (Exit) 102/54 108/62 134/64 102/58    Heart Rate (Admit) 76 bpm 59 bpm 62 bpm 65 bpm    Heart Rate (Exercise) 87 bpm 107 bpm 118 bpm 97 bpm    Heart Rate (Exit) 74 bpm 71 bpm 88 bpm 63 bpm    Rating of Perceived Exertion (Exercise) 15 15 13 13     Symptoms none none none none    Duration Continue with 30 min of aerobic exercise without signs/symptoms of physical distress. Continue with 30 min of aerobic exercise without signs/symptoms of physical distress. Continue with 30 min of aerobic exercise without signs/symptoms of  physical distress. Continue with 30 min of aerobic exercise without signs/symptoms of physical distress.    Intensity THRR unchanged THRR unchanged THRR unchanged THRR unchanged      Progression   Progression Continue to progress workloads to maintain intensity without signs/symptoms of physical distress. Continue to progress workloads to maintain intensity without signs/symptoms of physical distress. Continue to progress workloads to maintain intensity without signs/symptoms of physical distress. Continue to progress  workloads to maintain intensity without signs/symptoms of physical distress.    Average METs 1.99 2.1 2.15 2.04      Resistance Training   Training Prescription Yes Yes Yes Yes    Weight none  Pt's doctor did not give clearance for resistance training 4 lb 4 lb 4 lb    Reps 10-15 10-15 10-15 10-15      Interval Training   Interval Training No No No No      Treadmill   MPH 1 1.5 -- --    Grade 0 0 -- --    Minutes 15 15 -- --    METs 1.77 2.15 -- --      NuStep   Level 3  T6 nustep: level 3 4  T6: level 4 3  T6 nustep 4  T6: 3    Minutes 15 15 30 15     METs 2.3  T6 nustep: 1.9 METs 2.3  T6: 1.8 METs 2.2 2.3      T5 Nustep   Level -- -- -- 4    Minutes -- -- -- 15    METs -- -- -- 3      Track   Laps -- -- -- 15    Minutes -- -- -- 15    METs -- -- -- 1.82      Home Exercise Plan   Plans to continue exercise at Home (comment)  walking Home (comment)  walking Home (comment)  walking Home (comment)  walking    Frequency Add 2 additional days to program exercise sessions. Add 2 additional days to program exercise sessions. Add 2 additional days to program exercise sessions. Add 2 additional days to program exercise sessions.    Initial Home Exercises Provided 02/11/23 02/11/23 02/11/23 02/11/23      Oxygen   Maintain Oxygen Saturation 88% or higher 88% or higher 88% or higher 88% or higher             Exercise Comments:   Exercise Comments     Row Name 05/20/23 1537           Exercise Comments First full day of exercise!  Patient was oriented to gym and equipment including functions, settings, policies, and procedures.  Patient's individual exercise prescription and treatment plan were reviewed.  All starting workloads were established based on the results of the 6 minute walk test done at initial orientation visit.  The plan for exercise progression was also introduced and progression will be customized based on patient's performance and goals.                 Exercise Goals and Review:   Exercise Goals     Row Name 05/20/23 1536             Exercise Goals   Increase Physical Activity Yes       Intervention Develop an individualized exercise prescription for aerobic and resistive training based on initial evaluation findings, risk stratification, comorbidities and participant's personal goals.;Provide advice, education, support and counseling about physical activity/exercise needs.  Expected Outcomes Long Term: Add in home exercise to make exercise part of routine and to increase amount of physical activity.;Long Term: Exercising regularly at least 3-5 days a week.;Short Term: Attend rehab on a regular basis to increase amount of physical activity.       Able to understand and use rate of perceived exertion (RPE) scale Yes       Intervention Provide education and explanation on how to use RPE scale       Expected Outcomes Short Term: Able to use RPE daily in rehab to express subjective intensity level;Long Term:  Able to use RPE to guide intensity level when exercising independently       Able to understand and use Dyspnea scale Yes       Intervention Provide education and explanation on how to use Dyspnea scale       Expected Outcomes Short Term: Able to use Dyspnea scale daily in rehab to express subjective sense of shortness of breath during exertion;Long Term: Able to use Dyspnea scale to guide intensity level when exercising independently       Knowledge and understanding of Target Heart Rate Range (THRR) Yes       Intervention Provide education and explanation of THRR including how the numbers were predicted and where they are located for reference       Expected Outcomes Short Term: Able to state/look up THRR;Short Term: Able to use daily as guideline for intensity in rehab;Long Term: Able to use THRR to govern intensity when exercising independently       Able to check pulse independently Yes       Intervention Provide education  and demonstration on how to check pulse in carotid and radial arteries.;Review the importance of being able to check your own pulse for safety during independent exercise       Expected Outcomes Short Term: Able to explain why pulse checking is important during independent exercise;Long Term: Able to check pulse independently and accurately       Understanding of Exercise Prescription Yes       Intervention Provide education, explanation, and written materials on patient's individual exercise prescription       Expected Outcomes Short Term: Able to explain program exercise prescription;Long Term: Able to explain home exercise prescription to exercise independently                Exercise Goals Re-Evaluation :  Exercise Goals Re-Evaluation     Row Name 04/01/23 1623 04/16/23 1440 05/01/23 1043 05/15/23 0803       Exercise Goal Re-Evaluation   Exercise Goals Review Increase Physical Activity;Increase Strength and Stamina;Understanding of Exercise Prescription Increase Physical Activity;Increase Strength and Stamina;Understanding of Exercise Prescription Increase Physical Activity;Increase Strength and Stamina;Understanding of Exercise Prescription Increase Physical Activity;Increase Strength and Stamina;Understanding of Exercise Prescription    Comments Devin [name] is doing well in rehab. He recently began walking the treadmill at a speed of 1 mph with no incline. He also continues to work at level 3 on both the T4 nustep and T6 nustep. We will continue to monitor his progress in the program. Devin [name] continues to do well in the program. He recently increased his treadmill speed to 1.5 mph. He also improved to level 4 on the T6 nustep and continues to work at level 4 on the T4 nustep. We will continue to monitor his progress in the program. Devin [name] has only attended one session since the last review. He continues to work on the T6  nustep at level 3 for 30 minutes. We will encourage him to attend rehab more  consistently to see results from exercise. We will continue to monitor his progress in the program. Devin [name] is doing well and is close to graduating from the program. He recently walked back up to 15 laps on the track and improved to level 4 on the T5 nustep. He also completed his post and improved by 16%. We will continue to monitor his progress until he graduates from the program.    Expected Outcomes Short: Increase to level 4 on the T4 and T6 nustep machines. Long: Continue exercise to improve strength and stamina. Short: Increase to level 5 on the T4 nustep. Long: Continue exercise to improve strength and stamina. Short: Attend rehab more consistently. Long: Continue exercise to improve strength and stamina. --             Discharge Exercise Prescription (Final Exercise Prescription Changes):  Exercise Prescription Changes - 05/15/23 0700       Response to Exercise   Blood Pressure (Admit) 110/62    Blood Pressure (Exit) 102/58    Heart Rate (Admit) 65 bpm    Heart Rate (Exercise) 97 bpm    Heart Rate (Exit) 63 bpm    Rating of Perceived Exertion (Exercise) 13    Symptoms none    Duration Continue with 30 min of aerobic exercise without signs/symptoms of physical distress.    Intensity THRR unchanged      Progression   Progression Continue to progress workloads to maintain intensity without signs/symptoms of physical distress.    Average METs 2.04      Resistance Training   Training Prescription Yes    Weight 4 lb    Reps 10-15      Interval Training   Interval Training No      NuStep   Level 4   T6: 3   Minutes 15    METs 2.3      T5 Nustep   Level 4    Minutes 15    METs 3      Track   Laps 15    Minutes 15    METs 1.82      Home Exercise Plan   Plans to continue exercise at Home (comment)   walking   Frequency Add 2 additional days to program exercise sessions.    Initial Home Exercises Provided 02/11/23      Oxygen   Maintain Oxygen Saturation 88% or  higher             Nutrition:  Target Goals: Understanding of nutrition guidelines, daily intake of sodium 1500mg , cholesterol 200mg , calories 30% from fat and 7% or less from saturated fats, daily to have 5 or more servings of fruits and vegetables.  Education: All About Nutrition: -Group instruction provided by verbal, written material, interactive activities, discussions, models, and posters to present general guidelines for heart healthy nutrition including fat, fiber, MyPlate, the role of sodium in heart healthy nutrition, utilization of the nutrition label, and utilization of this knowledge for meal planning. Follow up email sent as well. Written material given at graduation. Flowsheet Row Cardiac Rehab from 11/19/2022 in [facility] Cardiac and Pulmonary Rehab  Education need identified 11/19/22       Biometrics:   Post Biometrics - 05/09/23 1605        Post  Biometrics   Height 6' (1.829 m)    Weight 222 lb (100.7 kg)  BMI (Calculated) 30.1    Single Leg Stand 2 seconds             Nutrition Therapy Plan and Nutrition Goals:   Nutrition Assessments:  MEDIFICTS Score Key: >=70 Need to make dietary changes  40-70 Heart Healthy Diet <= 40 Therapeutic Level Cholesterol Diet  Flowsheet Row Cardiac Rehab from 05/20/2023 in [name] Cardiac and Pulmonary Rehab  Picture Your Plate Total Score on Admission 64      Picture Your Plate Scores: <46 Unhealthy dietary pattern with much room for improvement. 41-50 Dietary pattern unlikely to meet recommendations for good health and room for improvement. 51-60 More healthful dietary pattern, with some room for improvement.  >60 Healthy dietary pattern, although there may be some specific behaviors that could be improved.    Nutrition Goals Re-Evaluation:   Nutrition Goals Discharge (Final Nutrition Goals Re-Evaluation):   Psychosocial: Target Goals: Acknowledge presence or absence of significant depression and/or stress,  maximize coping skills, provide positive support system. Participant is able to verbalize types and ability to use techniques and skills needed for reducing stress and depression.   Education: Stress, Anxiety, and Depression - Group verbal and visual presentation to define topics covered.  Reviews how body is impacted by stress, anxiety, and depression.  Also discusses healthy ways to reduce stress and to treat/manage anxiety and depression.  Written material given at graduation.   Education: Sleep Hygiene -Provides group verbal and written instruction about how sleep can affect your health.  Define sleep hygiene, discuss sleep cycles and impact of sleep habits. Review good sleep hygiene tips.    Initial Review & Psychosocial Screening:  Initial Psych Review & Screening - 05/20/23 1520       Initial Review   Current issues with None Identified      Family Dynamics   Good Support System? Yes      Barriers   Psychosocial barriers to participate in program There are no identifiable barriers or psychosocial needs.      Screening Interventions   Interventions Encouraged to exercise;To provide support and resources with identified psychosocial needs;Provide feedback about the scores to participant    Expected Outcomes Short Term goal: Utilizing psychosocial counselor, staff and physician to assist with identification of specific Stressors or current issues interfering with healing process. Setting desired goal for each stressor or current issue identified.;Long Term Goal: Stressors or current issues are controlled or eliminated.;Short Term goal: Identification and review with participant of any Quality of Life or Depression concerns found by scoring the questionnaire.;Long Term goal: The participant improves quality of Life and PHQ9 Scores as seen by post scores and/or verbalization of changes             Quality of Life Scores:   Quality of Life - 05/20/23 1523       Quality of Life    Select Quality of Life      Quality of Life Scores   Health/Function Pre 27.6 %    Socioeconomic Pre 28 %    Psych/Spiritual Pre 30 %    Family Pre 27 %    GLOBAL Pre 28.13 %            Scores of 19 and below usually indicate a poorer quality of life in these areas.  A difference of  2-3 points is a clinically meaningful difference.  A difference of 2-3 points in the total score of the Quality of Life Index has been associated with significant improvement in overall  quality of life, self-image, physical symptoms, and general health in studies assessing change in quality of life.  PHQ-9: Review Flowsheet  More data exists      05/16/2023 11/19/2022 01/12/2020 10/28/2019 09/09/2019  Depression screen PHQ 2/9  Decreased Interest 0 1 3 2 1   Down, Depressed, Hopeless 0 1 0 0 0  PHQ - 2 Score 0 2 3 2 1   Altered sleeping 0 1 1 3 3   Tired, decreased energy 3 1 2 2  0  Change in appetite 3 1 1 1  0  Feeling bad or failure about yourself  0 0 0 0 0  Trouble concentrating 0 0 0 1 0  Moving slowly or fidgety/restless 0 0 0 1 0  Suicidal thoughts 0 0 1 0 0  PHQ-9 Score 6 5 8 10 4   Difficult doing work/chores Not difficult at all Somewhat difficult Not difficult at all Somewhat difficult Not difficult at all   Interpretation of Total Score  Total Score Depression Severity:  1-4 = Minimal depression, 5-9 = Mild depression, 10-14 = Moderate depression, 15-19 = Moderately severe depression, 20-27 = Severe depression   Psychosocial Evaluation and Intervention:  Psychosocial Evaluation - 05/20/23 1520       Psychosocial Evaluation & Interventions   Interventions Encouraged to exercise with the program and follow exercise prescription    Comments Devin [name] is returning to the program after another stent placement. He is motivated to attend the program and has no barriers to attending. He has completed PT for balance and plans to increase his stamina in the program. He relies on his lady friend for  support and company    Expected Outcomes Short: attend cardiac rehab for education and exercise Long: develop and maintain positive self care habits    Continue Psychosocial Services  Follow up required by staff             Psychosocial Re-Evaluation:   Psychosocial Discharge (Final Psychosocial Re-Evaluation):   Vocational Rehabilitation: Provide vocational rehab assistance to qualifying candidates.   Vocational Rehab Evaluation & Intervention:  Vocational Rehab - 05/20/23 1519       Initial Vocational Rehab Evaluation & Intervention   Assessment shows need for Vocational Rehabilitation No             Education: Education Goals: Education classes will be provided on a variety of topics geared toward better understanding of heart health and risk factor modification. Participant will state understanding/return demonstration of topics presented as noted by education test scores.  Learning Barriers/Preferences:   General Cardiac Education Topics:  AED/CPR: - Group verbal and written instruction with the use of models to demonstrate the basic use of the AED with the basic ABC's of resuscitation.   Anatomy and Cardiac Procedures: - Group verbal and visual presentation and models provide information about basic cardiac anatomy and function. Reviews the testing methods done to diagnose heart disease and the outcomes of the test results. Describes the treatment choices: Medical Management, Angioplasty, or Coronary Bypass Surgery for treating various heart conditions including Myocardial Infarction, Angina, Valve Disease, and Cardiac Arrhythmias.  Written material given at graduation. Flowsheet Row Cardiac Rehab from 11/19/2022 in [facility] Cardiac and Pulmonary Rehab  Education need identified 11/19/22       Medication Safety: - Group verbal and visual instruction to review commonly prescribed medications for heart and lung disease. Reviews the medication, class of the drug, and  side effects. Includes the steps to properly store meds and maintain the prescription regimen.  Written material given at graduation. Flowsheet Row Cardiac Rehab from 10/07/2019 in [facility] Cardiac and Pulmonary Rehab  Date 10/07/19  Educator SB  Instruction Review Code 1- Verbalizes Understanding       Intimacy: - Group verbal instruction through game format to discuss how heart and lung disease can affect sexual intimacy. Written material given at graduation..   Know Your Numbers and Heart Failure: - Group verbal and visual instruction to discuss disease risk factors for cardiac and pulmonary disease and treatment options.  Reviews associated critical values for Overweight/Obesity, Hypertension, Cholesterol, and Diabetes.  Discusses basics of heart failure: signs/symptoms and treatments.  Introduces Heart Failure Zone chart for action plan for heart failure.  Written material given at graduation.   Infection Prevention: - Provides verbal and written material to individual with discussion of infection control including proper hand washing and proper equipment cleaning during exercise session. Flowsheet Row Cardiac Rehab from 11/19/2022 in [facility] Cardiac and Pulmonary Rehab  Date 11/19/22  Educator NT  Instruction Review Code 1- Verbalizes Understanding       Falls Prevention: - Provides verbal and written material to individual with discussion of falls prevention and safety. Flowsheet Row Cardiac Rehab from 11/19/2022 in [name] Cardiac and Pulmonary Rehab  Date 11/19/22  Educator NT  Instruction Review Code 1- Verbalizes Understanding       Other: -Provides group and verbal instruction on various topics (see comments)   Knowledge Questionnaire Score:  Knowledge Questionnaire Score - 05/20/23 1519       Knowledge Questionnaire Score   Pre Score 24/26             Core Components/Risk Factors/Patient Goals at Admission:  Personal Goals and Risk Factors at Admission - 05/20/23  1520       Core Components/Risk Factors/Patient Goals on Admission    Weight Management Yes;Weight Maintenance    Intervention Weight Management: Develop a combined nutrition and exercise program designed to reach desired caloric intake, while maintaining appropriate intake of nutrient and fiber, sodium and fats, and appropriate energy expenditure required for the weight goal.;Weight Management: Provide education and appropriate resources to help participant work on and attain dietary goals.    Expected Outcomes Short Term: Continue to assess and modify interventions until short term weight is achieved;Long Term: Adherence to nutrition and physical activity/exercise program aimed toward attainment of established weight goal;Weight Maintenance: Understanding of the daily nutrition guidelines, which includes 25-35% calories from fat, 7% or less cal from saturated fats, less than 200mg  cholesterol, less than 1.5gm of sodium, & 5 or more servings of fruits and vegetables daily    Diabetes Yes    Intervention Provide education about signs/symptoms and action to take for hypo/hyperglycemia.;Provide education about proper nutrition, including hydration, and aerobic/resistive exercise prescription along with prescribed medications to achieve blood glucose in normal ranges: Fasting glucose 65-99 mg/dL    Expected Outcomes Short Term: Participant verbalizes understanding of the signs/symptoms and immediate care of hyper/hypoglycemia, proper foot care and importance of medication, aerobic/resistive exercise and nutrition plan for blood glucose control.;Long Term: Attainment of HbA1C < 7%.    Heart Failure Yes    Intervention Provide a combined exercise and nutrition program that is supplemented with education, support and counseling about heart failure. Directed toward relieving symptoms such as shortness of breath, decreased exercise tolerance, and extremity edema.    Expected Outcomes Improve functional capacity  of life;Short term: Attendance in program 2-3 days a week with increased exercise capacity. Reported lower sodium intake. Reported  increased fruit and vegetable intake. Reports medication compliance.;Short term: Daily weights obtained and reported for increase. Utilizing diuretic protocols set by physician.;Long term: Adoption of self-care skills and reduction of barriers for early signs and symptoms recognition and intervention leading to self-care maintenance.    Hypertension Yes    Intervention Provide education on lifestyle modifcations including regular physical activity/exercise, weight management, moderate sodium restriction and increased consumption of fresh fruit, vegetables, and low fat dairy, alcohol moderation, and smoking cessation.;Monitor prescription use compliance.    Expected Outcomes Short Term: Continued assessment and intervention until BP is < 140/64mm HG in hypertensive participants. < 130/84mm HG in hypertensive participants with diabetes, heart failure or chronic kidney disease.;Long Term: Maintenance of blood pressure at goal levels.    Lipids Yes    Intervention Provide education and support for participant on nutrition & aerobic/resistive exercise along with prescribed medications to achieve LDL 70mg , HDL >40mg .    Expected Outcomes Short Term: Participant states understanding of desired cholesterol values and is compliant with medications prescribed. Participant is following exercise prescription and nutrition guidelines.;Long Term: Cholesterol controlled with medications as prescribed, with individualized exercise RX and with personalized nutrition plan. Value goals: LDL < 70mg , HDL > 40 mg.             Education:Diabetes - Individual verbal and written instruction to review signs/symptoms of diabetes, desired ranges of glucose level fasting, after meals and with exercise. Acknowledge that pre and post exercise glucose checks will be done for 3 sessions at entry of  program. Flowsheet Row Cardiac Rehab from 01/13/2020 in [facility] Cardiac and Pulmonary Rehab  Date 01/13/20  Educator [facility]  Instruction Review Code 1- Verbalizes Understanding       Core Components/Risk Factors/Patient Goals Review:    Core Components/Risk Factors/Patient Goals at Discharge (Final Review):    ITP Comments:  ITP Comments     Row Name 03/27/23 1459 04/24/23 0800 05/16/23 1553 05/20/23 1537     ITP Comments 30 Day review completed. Medical Director ITP review done, changes made as directed, and signed approval by Medical Director. 30 Day review completed. Medical Director ITP review done, changes made as directed, and signed approval by Medical Director. Devin [name] graduated today from  rehab with 36 sessions completed.  Details of the patient's exercise prescription and what He needs to do in order to continue the prescription and progress were discussed with patient.  Patient was given a copy of prescription and goals.  Patient verbalized understanding. Devin [name] plans to continue to exercise by attending structured exercise program. First full day of exercise!  Patient was oriented to gym and equipment including functions, settings, policies, and procedures.  Patient's individual exercise prescription and treatment plan were reviewed.  All starting workloads were established based on the results of the 6 minute walk test done at initial orientation visit.  The plan for exercise progression was also introduced and progression will be customized based on patient's performance and goals.             Comments: Initial ITP

## 2023-05-22 DIAGNOSIS — Z955 Presence of coronary angioplasty implant and graft: Secondary | ICD-10-CM

## 2023-05-22 NOTE — Progress Notes (Signed)
 Cardiac Individual Treatment Plan  Patient Details  Name: Devin [name] [facility]. MRN: [id number] Date of Birth: Jan 22, 1946 Referring Provider:   Flowsheet Row Cardiac Rehab from 05/20/2023 in [facility] Cardiac and Pulmonary Rehab  Referring Provider Dr. Adrian Blackwater       Initial Encounter Date:  Flowsheet Row Cardiac Rehab from 05/20/2023 in [facility] Cardiac and Pulmonary Rehab  Date 05/20/23       Visit Diagnosis: Status post coronary artery stent placement  Patient's Home Medications on Admission:  Current Outpatient Medications:    albuterol (VENTOLIN HFA) 108 (90 Base) MCG/ACT inhaler, Inhale 2 puffs into the lungs every 6 (six) hours as needed for wheezing or shortness of breath., Disp: , Rfl:    amiodarone (PACERONE) 200 MG tablet, Take 200 mg by mouth daily., Disp: , Rfl:    amLODipine (NORVASC) 10 MG tablet, Take 1 tablet by mouth daily., Disp: , Rfl:    apixaban (ELIQUIS) 5 MG TABS tablet, Take 1 tablet (5 mg total) by mouth 2 (two) times daily., Disp: 60 tablet, Rfl: 3   atorvastatin (LIPITOR) 80 MG tablet, Take 80 mg by mouth daily., Disp: , Rfl:    buPROPion (WELLBUTRIN XL) 300 MG 24 hr tablet, Take 300 mg by mouth daily., Disp: , Rfl:    cephALEXin (KEFLEX) 500 MG capsule, Take 500 mg by mouth 3 (three) times daily. (Patient not taking: Reported on 04/26/2023), Disp: , Rfl:    clopidogrel (PLAVIX) 75 MG tablet, Take 1 tablet (75 mg total) by mouth daily with breakfast., Disp: 30 tablet, Rfl: 11   docusate (COLACE) 60 MG/15ML syrup, Take 60 mg by mouth daily., Disp: , Rfl:    EPINEPHrine 0.3 mg/0.3 mL IJ SOAJ injection, Inject 0.3 mg into the muscle as needed for anaphylaxis., Disp: , Rfl:    ezetimibe (ZETIA) 10 MG tablet, Take 10 mg by mouth daily., Disp: , Rfl:    fluticasone (FLONASE) 50 MCG/ACT nasal spray, SPRAY 2 SPRAYS INTO EACH NOSTRIL EVERY DAY, Disp: , Rfl:    gabapentin (NEURONTIN) 300 MG capsule, Take 300 mg by mouth 2 (two) times daily., Disp: , Rfl:    loratadine  (CLARITIN) 10 MG tablet, Take 10 mg by mouth daily., Disp: , Rfl:    metFORMIN (GLUCOPHAGE) 500 MG tablet, Take 1,000 mg by mouth 2 (two) times daily., Disp: , Rfl:    metoprolol succinate (TOPROL XL) 50 MG 24 hr tablet, Take 1 tablet (50 mg total) by mouth daily. Take with or immediately following a meal., Disp: 30 tablet, Rfl: 11   nitroGLYCERIN (NITROSTAT) 0.4 MG SL tablet, Place 0.4 mg under the tongue every 5 (five) minutes as needed for chest pain., Disp: , Rfl:    pantoprazole (PROTONIX) 40 MG tablet, TAKE 1 TABLET BY MOUTH EVERY DAY, Disp: 30 tablet, Rfl: 1   ranolazine (RANEXA) 500 MG 12 hr tablet, Take 1 tablet (500 mg total) by mouth 2 (two) times daily., Disp: 60 tablet, Rfl: 3   sacubitril-valsartan (ENTRESTO) 97-103 MG, TAKE 1 TABLET BY MOUTH TWICE A DAY, Disp: 180 tablet, Rfl: 1  Past Medical History: Past Medical History:  Diagnosis Date   AAA (abdominal aortic aneurysm) (HCC)    Anxiety    Aortic atherosclerosis (HCC)    Arthritis    Atrial fibrillation (HCC)    CAD (coronary artery disease)    CHF (congestive heart failure) (HCC)    Current use of long term anticoagulation    Apixaban   Depression    Diabetes mellitus without  complication (HCC)    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   Hypertension    Ischemic cardiomyopathy    MI, old    Peripheral neuropathy    PFO (patent foramen ovale) 05/2019   s/p repair   Sleep apnea    Spinal stenosis of lumbar region    TIA (transient ischemic attack)     Tobacco Use: Social History   Tobacco Use  Smoking Status Former  Smokeless Tobacco Never  Tobacco Comments   Quit over 40 years ago    Labs: Review Flowsheet  More data exists      Latest Ref Rng & Units 04/08/2020 07/04/2020 04/12/2022 04/13/2022 10/13/2022  Labs for ITP Cardiac and Pulmonary Rehab  Cholestrol 0 - 200 mg/dL - - - - 161   LDL (calc) 0 - 99 mg/dL - - - - 35   HDL-C >09 mg/dL - - - - 59   Trlycerides <150 mg/dL - - - - 55   Hemoglobin A1c 4.8 -  5.6 % 5.8  - - 5.5  6.3   Bicarbonate 20.0 - 28.0 mmol/L - - 23.9  - -  TCO2 22 - 32 mmol/L - 24  - - -  Acid-base deficit 0.0 - 2.0 mmol/L - - 0.1  - -  O2 Saturation % - - 90.3  - -     Exercise Target Goals: Exercise Program Goal: Individual exercise prescription set using results from initial 6 min walk test and THRR while considering  patient's activity barriers and safety.   Exercise Prescription Goal: Initial exercise prescription builds to 30-45 minutes a day of aerobic activity, 2-3 days per week.  Home exercise guidelines will be given to patient during program as part of exercise prescription that the participant will acknowledge.   Education: Aerobic Exercise: - Group verbal and visual presentation on the components of exercise prescription. Introduces F.I.T.T principle from ACSM for exercise prescriptions.  Reviews F.I.T.T. principles of aerobic exercise including progression. Written material given at graduation. Flowsheet Row Cardiac Rehab from 11/19/2022 in [facility] Cardiac and Pulmonary Rehab  Education need identified 11/19/22       Education: Resistance Exercise: - Group verbal and visual presentation on the components of exercise prescription. Introduces F.I.T.T principle from ACSM for exercise prescriptions  Reviews F.I.T.T. principles of resistance exercise including progression. Written material given at graduation.    Education: Exercise & Equipment Safety: - Individual verbal instruction and demonstration of equipment use and safety with use of the equipment. Flowsheet Row Cardiac Rehab from 11/19/2022 in [facility] Cardiac and Pulmonary Rehab  Date 11/19/22  Educator NT  Instruction Review Code 1- Verbalizes Understanding       Education: Exercise Physiology & General Exercise Guidelines: - Group verbal and written instruction with models to review the exercise physiology of the cardiovascular system and associated critical values. Provides general exercise  guidelines with specific guidelines to those with heart or lung disease.  Flowsheet Row Cardiac Rehab from 10/07/2019 in [facility] Cardiac and Pulmonary Rehab  Date 09/23/19  Educator AS  Instruction Review Code 1- Verbalizes Understanding       Education: Flexibility, Balance, Mind/Body Relaxation: - Group verbal and visual presentation with interactive activity on the components of exercise prescription. Introduces F.I.T.T principle from ACSM for exercise prescriptions. Reviews F.I.T.T. principles of flexibility and balance exercise training including progression. Also discusses the mind body connection.  Reviews various relaxation techniques to help reduce and manage stress (i.e. Deep breathing, progressive muscle relaxation, and visualization). Balance  handout provided to take home. Written material given at graduation.   Activity Barriers & Risk Stratification:  Activity Barriers & Cardiac Risk Stratification - 05/20/23 1536       Activity Barriers & Cardiac Risk Stratification   Activity Barriers Right Knee Replacement;Left Knee Replacement;Muscular Weakness;Balance Concerns;Other (comment)    Comments drop foot, impaired gait, neuropathy    Cardiac Risk Stratification Moderate             6 Minute Walk:  6 Minute Walk     Row Name 05/09/23 1603 05/20/23 1535       6 Minute Walk   Phase Discharge Initial    Distance 720 feet 720 feet    Distance % Change 16 % --    Distance Feet Change 100 ft 100 ft    Walk Time 6 minutes 6 minutes    # of Rest Breaks 0 0    MPH 1.36 1.36    METS 1.13 1.13    RPE 13 13    Perceived Dyspnea  1 1    VO2 Peak 3.96 3.96    Symptoms No No    Resting HR 65 bpm 65 bpm    Resting BP 110/62 --    Resting Oxygen Saturation  95 % 95 %    Exercise Oxygen Saturation  during 6 min walk 95 % 95 %    Max Ex. HR 92 bpm 92 bpm    Max Ex. BP 122/64 122/64             Oxygen Initial Assessment:   Oxygen Re-Evaluation:   Oxygen Discharge  (Final Oxygen Re-Evaluation):   Initial Exercise Prescription:  Initial Exercise Prescription - 05/20/23 1500       Date of Initial Exercise RX and Referring Provider   Date 05/20/23    Referring Provider Dr. Adrian Blackwater      Oxygen   Maintain Oxygen Saturation 88% or higher      Treadmill   MPH 1      NuStep   Level 4   T6: 3   Minutes 15    METs 2.3      T5 Nustep   Level 4    Minutes 15    METs 3      Track   Laps 15    Minutes 15    METs 1.82      Resistance Training   Training Prescription Yes    Weight 4 lb    Reps 10-15             Perform Capillary Blood Glucose checks as needed.  Exercise Prescription Changes:   Exercise Prescription Changes     Row Name 04/01/23 1600 04/16/23 1400 05/01/23 1000 05/15/23 0700       Response to Exercise   Blood Pressure (Admit) 102/56 92/58 116/60 110/62    Blood Pressure (Exit) 102/54 108/62 134/64 102/58    Heart Rate (Admit) 76 bpm 59 bpm 62 bpm 65 bpm    Heart Rate (Exercise) 87 bpm 107 bpm 118 bpm 97 bpm    Heart Rate (Exit) 74 bpm 71 bpm 88 bpm 63 bpm    Rating of Perceived Exertion (Exercise) 15 15 13 13     Symptoms none none none none    Duration Continue with 30 min of aerobic exercise without signs/symptoms of physical distress. Continue with 30 min of aerobic exercise without signs/symptoms of physical distress. Continue with 30 min of aerobic exercise without signs/symptoms of  physical distress. Continue with 30 min of aerobic exercise without signs/symptoms of physical distress.    Intensity THRR unchanged THRR unchanged THRR unchanged THRR unchanged      Progression   Progression Continue to progress workloads to maintain intensity without signs/symptoms of physical distress. Continue to progress workloads to maintain intensity without signs/symptoms of physical distress. Continue to progress workloads to maintain intensity without signs/symptoms of physical distress. Continue to progress  workloads to maintain intensity without signs/symptoms of physical distress.    Average METs 1.99 2.1 2.15 2.04      Resistance Training   Training Prescription Yes Yes Yes Yes    Weight none  Pt's doctor did not give clearance for resistance training 4 lb 4 lb 4 lb    Reps 10-15 10-15 10-15 10-15      Interval Training   Interval Training No No No No      Treadmill   MPH 1 1.5 -- --    Grade 0 0 -- --    Minutes 15 15 -- --    METs 1.77 2.15 -- --      NuStep   Level 3  T6 nustep: level 3 4  T6: level 4 3  T6 nustep 4  T6: 3    Minutes 15 15 30 15     METs 2.3  T6 nustep: 1.9 METs 2.3  T6: 1.8 METs 2.2 2.3      T5 Nustep   Level -- -- -- 4    Minutes -- -- -- 15    METs -- -- -- 3      Track   Laps -- -- -- 15    Minutes -- -- -- 15    METs -- -- -- 1.82      Home Exercise Plan   Plans to continue exercise at Home (comment)  walking Home (comment)  walking Home (comment)  walking Home (comment)  walking    Frequency Add 2 additional days to program exercise sessions. Add 2 additional days to program exercise sessions. Add 2 additional days to program exercise sessions. Add 2 additional days to program exercise sessions.    Initial Home Exercises Provided 02/11/23 02/11/23 02/11/23 02/11/23      Oxygen   Maintain Oxygen Saturation 88% or higher 88% or higher 88% or higher 88% or higher             Exercise Comments:   Exercise Comments     Row Name 05/20/23 1537           Exercise Comments First full day of exercise!  Patient was oriented to gym and equipment including functions, settings, policies, and procedures.  Patient's individual exercise prescription and treatment plan were reviewed.  All starting workloads were established based on the results of the 6 minute walk test done at initial orientation visit.  The plan for exercise progression was also introduced and progression will be customized based on patient's performance and goals.                 Exercise Goals and Review:   Exercise Goals     Row Name 05/20/23 1536             Exercise Goals   Increase Physical Activity Yes       Intervention Develop an individualized exercise prescription for aerobic and resistive training based on initial evaluation findings, risk stratification, comorbidities and participant's personal goals.;Provide advice, education, support and counseling about physical activity/exercise needs.  Expected Outcomes Long Term: Add in home exercise to make exercise part of routine and to increase amount of physical activity.;Long Term: Exercising regularly at least 3-5 days a week.;Short Term: Attend rehab on a regular basis to increase amount of physical activity.       Able to understand and use rate of perceived exertion (RPE) scale Yes       Intervention Provide education and explanation on how to use RPE scale       Expected Outcomes Short Term: Able to use RPE daily in rehab to express subjective intensity level;Long Term:  Able to use RPE to guide intensity level when exercising independently       Able to understand and use Dyspnea scale Yes       Intervention Provide education and explanation on how to use Dyspnea scale       Expected Outcomes Short Term: Able to use Dyspnea scale daily in rehab to express subjective sense of shortness of breath during exertion;Long Term: Able to use Dyspnea scale to guide intensity level when exercising independently       Knowledge and understanding of Target Heart Rate Range (THRR) Yes       Intervention Provide education and explanation of THRR including how the numbers were predicted and where they are located for reference       Expected Outcomes Short Term: Able to state/look up THRR;Short Term: Able to use daily as guideline for intensity in rehab;Long Term: Able to use THRR to govern intensity when exercising independently       Able to check pulse independently Yes       Intervention Provide education  and demonstration on how to check pulse in carotid and radial arteries.;Review the importance of being able to check your own pulse for safety during independent exercise       Expected Outcomes Short Term: Able to explain why pulse checking is important during independent exercise;Long Term: Able to check pulse independently and accurately       Understanding of Exercise Prescription Yes       Intervention Provide education, explanation, and written materials on patient's individual exercise prescription       Expected Outcomes Short Term: Able to explain program exercise prescription;Long Term: Able to explain home exercise prescription to exercise independently                Exercise Goals Re-Evaluation :  Exercise Goals Re-Evaluation     Row Name 04/01/23 1623 04/16/23 1440 05/01/23 1043 05/15/23 0803       Exercise Goal Re-Evaluation   Exercise Goals Review Increase Physical Activity;Increase Strength and Stamina;Understanding of Exercise Prescription Increase Physical Activity;Increase Strength and Stamina;Understanding of Exercise Prescription Increase Physical Activity;Increase Strength and Stamina;Understanding of Exercise Prescription Increase Physical Activity;Increase Strength and Stamina;Understanding of Exercise Prescription    Comments Devin [name] is doing well in rehab. He recently began walking the treadmill at a speed of 1 mph with no incline. He also continues to work at level 3 on both the T4 nustep and T6 nustep. We will continue to monitor his progress in the program. Devin [name] continues to do well in the program. He recently increased his treadmill speed to 1.5 mph. He also improved to level 4 on the T6 nustep and continues to work at level 4 on the T4 nustep. We will continue to monitor his progress in the program. Devin [name] has only attended one session since the last review. He continues to work on the T6  nustep at level 3 for 30 minutes. We will encourage him to attend rehab more  consistently to see results from exercise. We will continue to monitor his progress in the program. Devin [name] is doing well and is close to graduating from the program. He recently walked back up to 15 laps on the track and improved to level 4 on the T5 nustep. He also completed his post and improved by 16%. We will continue to monitor his progress until he graduates from the program.    Expected Outcomes Short: Increase to level 4 on the T4 and T6 nustep machines. Long: Continue exercise to improve strength and stamina. Short: Increase to level 5 on the T4 nustep. Long: Continue exercise to improve strength and stamina. Short: Attend rehab more consistently. Long: Continue exercise to improve strength and stamina. --             Discharge Exercise Prescription (Final Exercise Prescription Changes):  Exercise Prescription Changes - 05/15/23 0700       Response to Exercise   Blood Pressure (Admit) 110/62    Blood Pressure (Exit) 102/58    Heart Rate (Admit) 65 bpm    Heart Rate (Exercise) 97 bpm    Heart Rate (Exit) 63 bpm    Rating of Perceived Exertion (Exercise) 13    Symptoms none    Duration Continue with 30 min of aerobic exercise without signs/symptoms of physical distress.    Intensity THRR unchanged      Progression   Progression Continue to progress workloads to maintain intensity without signs/symptoms of physical distress.    Average METs 2.04      Resistance Training   Training Prescription Yes    Weight 4 lb    Reps 10-15      Interval Training   Interval Training No      NuStep   Level 4   T6: 3   Minutes 15    METs 2.3      T5 Nustep   Level 4    Minutes 15    METs 3      Track   Laps 15    Minutes 15    METs 1.82      Home Exercise Plan   Plans to continue exercise at Home (comment)   walking   Frequency Add 2 additional days to program exercise sessions.    Initial Home Exercises Provided 02/11/23      Oxygen   Maintain Oxygen Saturation 88% or  higher             Nutrition:  Target Goals: Understanding of nutrition guidelines, daily intake of sodium 1500mg , cholesterol 200mg , calories 30% from fat and 7% or less from saturated fats, daily to have 5 or more servings of fruits and vegetables.  Education: All About Nutrition: -Group instruction provided by verbal, written material, interactive activities, discussions, models, and posters to present general guidelines for heart healthy nutrition including fat, fiber, MyPlate, the role of sodium in heart healthy nutrition, utilization of the nutrition label, and utilization of this knowledge for meal planning. Follow up email sent as well. Written material given at graduation. Flowsheet Row Cardiac Rehab from 11/19/2022 in [facility] Cardiac and Pulmonary Rehab  Education need identified 11/19/22       Biometrics:   Post Biometrics - 05/09/23 1605        Post  Biometrics   Height 6' (1.829 m)    Weight 222 lb (100.7 kg)  BMI (Calculated) 30.1    Single Leg Stand 2 seconds             Nutrition Therapy Plan and Nutrition Goals:   Nutrition Assessments:  MEDIFICTS Score Key: >=70 Need to make dietary changes  40-70 Heart Healthy Diet <= 40 Therapeutic Level Cholesterol Diet  Flowsheet Row Cardiac Rehab from 05/20/2023 in [facility] Cardiac and Pulmonary Rehab  Picture Your Plate Total Score on Admission 64      Picture Your Plate Scores: <40 Unhealthy dietary pattern with much room for improvement. 41-50 Dietary pattern unlikely to meet recommendations for good health and room for improvement. 51-60 More healthful dietary pattern, with some room for improvement.  >60 Healthy dietary pattern, although there may be some specific behaviors that could be improved.    Nutrition Goals Re-Evaluation:   Nutrition Goals Discharge (Final Nutrition Goals Re-Evaluation):   Psychosocial: Target Goals: Acknowledge presence or absence of significant depression and/or stress,  maximize coping skills, provide positive support system. Participant is able to verbalize types and ability to use techniques and skills needed for reducing stress and depression.   Education: Stress, Anxiety, and Depression - Group verbal and visual presentation to define topics covered.  Reviews how body is impacted by stress, anxiety, and depression.  Also discusses healthy ways to reduce stress and to treat/manage anxiety and depression.  Written material given at graduation.   Education: Sleep Hygiene -Provides group verbal and written instruction about how sleep can affect your health.  Define sleep hygiene, discuss sleep cycles and impact of sleep habits. Review good sleep hygiene tips.    Initial Review & Psychosocial Screening:  Initial Psych Review & Screening - 05/20/23 1520       Initial Review   Current issues with None Identified      Family Dynamics   Good Support System? Yes      Barriers   Psychosocial barriers to participate in program There are no identifiable barriers or psychosocial needs.      Screening Interventions   Interventions Encouraged to exercise;To provide support and resources with identified psychosocial needs;Provide feedback about the scores to participant    Expected Outcomes Short Term goal: Utilizing psychosocial counselor, staff and physician to assist with identification of specific Stressors or current issues interfering with healing process. Setting desired goal for each stressor or current issue identified.;Long Term Goal: Stressors or current issues are controlled or eliminated.;Short Term goal: Identification and review with participant of any Quality of Life or Depression concerns found by scoring the questionnaire.;Long Term goal: The participant improves quality of Life and PHQ9 Scores as seen by post scores and/or verbalization of changes             Quality of Life Scores:   Quality of Life - 05/20/23 1523       Quality of Life    Select Quality of Life      Quality of Life Scores   Health/Function Pre 27.6 %    Socioeconomic Pre 28 %    Psych/Spiritual Pre 30 %    Family Pre 27 %    GLOBAL Pre 28.13 %            Scores of 19 and below usually indicate a poorer quality of life in these areas.  A difference of  2-3 points is a clinically meaningful difference.  A difference of 2-3 points in the total score of the Quality of Life Index has been associated with significant improvement in overall  quality of life, self-image, physical symptoms, and general health in studies assessing change in quality of life.  PHQ-9: Review Flowsheet  More data exists      05/16/2023 11/19/2022 01/12/2020 10/28/2019 09/09/2019  Depression screen PHQ 2/9  Decreased Interest 0 1 3 2 1   Down, Depressed, Hopeless 0 1 0 0 0  PHQ - 2 Score 0 2 3 2 1   Altered sleeping 0 1 1 3 3   Tired, decreased energy 3 1 2 2  0  Change in appetite 3 1 1 1  0  Feeling bad or failure about yourself  0 0 0 0 0  Trouble concentrating 0 0 0 1 0  Moving slowly or fidgety/restless 0 0 0 1 0  Suicidal thoughts 0 0 1 0 0  PHQ-9 Score 6 5 8 10 4   Difficult doing work/chores Not difficult at all Somewhat difficult Not difficult at all Somewhat difficult Not difficult at all   Interpretation of Total Score  Total Score Depression Severity:  1-4 = Minimal depression, 5-9 = Mild depression, 10-14 = Moderate depression, 15-19 = Moderately severe depression, 20-27 = Severe depression   Psychosocial Evaluation and Intervention:  Psychosocial Evaluation - 05/20/23 1520       Psychosocial Evaluation & Interventions   Interventions Encouraged to exercise with the program and follow exercise prescription    Comments Devin [name] is returning to the program after another stent placement. He is motivated to attend the program and has no barriers to attending. He has completed PT for balance and plans to increase his stamina in the program. He relies on his lady friend for  support and company    Expected Outcomes Short: attend cardiac rehab for education and exercise Long: develop and maintain positive self care habits    Continue Psychosocial Services  Follow up required by staff             Psychosocial Re-Evaluation:   Psychosocial Discharge (Final Psychosocial Re-Evaluation):   Vocational Rehabilitation: Provide vocational rehab assistance to qualifying candidates.   Vocational Rehab Evaluation & Intervention:  Vocational Rehab - 05/20/23 1519       Initial Vocational Rehab Evaluation & Intervention   Assessment shows need for Vocational Rehabilitation No             Education: Education Goals: Education classes will be provided on a variety of topics geared toward better understanding of heart health and risk factor modification. Participant will state understanding/return demonstration of topics presented as noted by education test scores.  Learning Barriers/Preferences:   General Cardiac Education Topics:  AED/CPR: - Group verbal and written instruction with the use of models to demonstrate the basic use of the AED with the basic ABC's of resuscitation.   Anatomy and Cardiac Procedures: - Group verbal and visual presentation and models provide information about basic cardiac anatomy and function. Reviews the testing methods done to diagnose heart disease and the outcomes of the test results. Describes the treatment choices: Medical Management, Angioplasty, or Coronary Bypass Surgery for treating various heart conditions including Myocardial Infarction, Angina, Valve Disease, and Cardiac Arrhythmias.  Written material given at graduation. Flowsheet Row Cardiac Rehab from 11/19/2022 in [facility] Cardiac and Pulmonary Rehab  Education need identified 11/19/22       Medication Safety: - Group verbal and visual instruction to review commonly prescribed medications for heart and lung disease. Reviews the medication, class of the drug, and  side effects. Includes the steps to properly store meds and maintain the prescription regimen.  Written material given at graduation. Flowsheet Row Cardiac Rehab from 10/07/2019 in [facility] Cardiac and Pulmonary Rehab  Date 10/07/19  Educator SB  Instruction Review Code 1- Verbalizes Understanding       Intimacy: - Group verbal instruction through game format to discuss how heart and lung disease can affect sexual intimacy. Written material given at graduation..   Know Your Numbers and Heart Failure: - Group verbal and visual instruction to discuss disease risk factors for cardiac and pulmonary disease and treatment options.  Reviews associated critical values for Overweight/Obesity, Hypertension, Cholesterol, and Diabetes.  Discusses basics of heart failure: signs/symptoms and treatments.  Introduces Heart Failure Zone chart for action plan for heart failure.  Written material given at graduation.   Infection Prevention: - Provides verbal and written material to individual with discussion of infection control including proper hand washing and proper equipment cleaning during exercise session. Flowsheet Row Cardiac Rehab from 11/19/2022 in [facility] Cardiac and Pulmonary Rehab  Date 11/19/22  Educator NT  Instruction Review Code 1- Verbalizes Understanding       Falls Prevention: - Provides verbal and written material to individual with discussion of falls prevention and safety. Flowsheet Row Cardiac Rehab from 11/19/2022 in [facility] Cardiac and Pulmonary Rehab  Date 11/19/22  Educator NT  Instruction Review Code 1- Verbalizes Understanding       Other: -Provides group and verbal instruction on various topics (see comments)   Knowledge Questionnaire Score:  Knowledge Questionnaire Score - 05/20/23 1519       Knowledge Questionnaire Score   Pre Score 24/26             Core Components/Risk Factors/Patient Goals at Admission:  Personal Goals and Risk Factors at Admission - 05/20/23  1520       Core Components/Risk Factors/Patient Goals on Admission    Weight Management Yes;Weight Maintenance    Intervention Weight Management: Develop a combined nutrition and exercise program designed to reach desired caloric intake, while maintaining appropriate intake of nutrient and fiber, sodium and fats, and appropriate energy expenditure required for the weight goal.;Weight Management: Provide education and appropriate resources to help participant work on and attain dietary goals.    Expected Outcomes Short Term: Continue to assess and modify interventions until short term weight is achieved;Long Term: Adherence to nutrition and physical activity/exercise program aimed toward attainment of established weight goal;Weight Maintenance: Understanding of the daily nutrition guidelines, which includes 25-35% calories from fat, 7% or less cal from saturated fats, less than 200mg  cholesterol, less than 1.5gm of sodium, & 5 or more servings of fruits and vegetables daily    Diabetes Yes    Intervention Provide education about signs/symptoms and action to take for hypo/hyperglycemia.;Provide education about proper nutrition, including hydration, and aerobic/resistive exercise prescription along with prescribed medications to achieve blood glucose in normal ranges: Fasting glucose 65-99 mg/dL    Expected Outcomes Short Term: Participant verbalizes understanding of the signs/symptoms and immediate care of hyper/hypoglycemia, proper foot care and importance of medication, aerobic/resistive exercise and nutrition plan for blood glucose control.;Long Term: Attainment of HbA1C < 7%.    Heart Failure Yes    Intervention Provide a combined exercise and nutrition program that is supplemented with education, support and counseling about heart failure. Directed toward relieving symptoms such as shortness of breath, decreased exercise tolerance, and extremity edema.    Expected Outcomes Improve functional capacity  of life;Short term: Attendance in program 2-3 days a week with increased exercise capacity. Reported lower sodium intake. Reported  increased fruit and vegetable intake. Reports medication compliance.;Short term: Daily weights obtained and reported for increase. Utilizing diuretic protocols set by physician.;Long term: Adoption of self-care skills and reduction of barriers for early signs and symptoms recognition and intervention leading to self-care maintenance.    Hypertension Yes    Intervention Provide education on lifestyle modifcations including regular physical activity/exercise, weight management, moderate sodium restriction and increased consumption of fresh fruit, vegetables, and low fat dairy, alcohol moderation, and smoking cessation.;Monitor prescription use compliance.    Expected Outcomes Short Term: Continued assessment and intervention until BP is < 140/42mm HG in hypertensive participants. < 130/68mm HG in hypertensive participants with diabetes, heart failure or chronic kidney disease.;Long Term: Maintenance of blood pressure at goal levels.    Lipids Yes    Intervention Provide education and support for participant on nutrition & aerobic/resistive exercise along with prescribed medications to achieve LDL 70mg , HDL >40mg .    Expected Outcomes Short Term: Participant states understanding of desired cholesterol values and is compliant with medications prescribed. Participant is following exercise prescription and nutrition guidelines.;Long Term: Cholesterol controlled with medications as prescribed, with individualized exercise RX and with personalized nutrition plan. Value goals: LDL < 70mg , HDL > 40 mg.             Education:Diabetes - Individual verbal and written instruction to review signs/symptoms of diabetes, desired ranges of glucose level fasting, after meals and with exercise. Acknowledge that pre and post exercise glucose checks will be done for 3 sessions at entry of  program. Flowsheet Row Cardiac Rehab from 01/13/2020 in [facility] Cardiac and Pulmonary Rehab  Date 01/13/20  Educator [facility]  Instruction Review Code 1- Verbalizes Understanding       Core Components/Risk Factors/Patient Goals Review:    Core Components/Risk Factors/Patient Goals at Discharge (Final Review):    ITP Comments:  ITP Comments     Row Name 03/27/23 1459 04/24/23 0800 05/16/23 1553 05/20/23 1537 05/22/23 0754   ITP Comments 30 Day review completed. Medical Director ITP review done, changes made as directed, and signed approval by Medical Director. 30 Day review completed. Medical Director ITP review done, changes made as directed, and signed approval by Medical Director. Devin [name] graduated today from  rehab with 36 sessions completed.  Details of the patient's exercise prescription and what He needs to do in order to continue the prescription and progress were discussed with patient.  Patient was given a copy of prescription and goals.  Patient verbalized understanding. Devin [name] plans to continue to exercise by attending structured exercise program. First full day of exercise!  Patient was oriented to gym and equipment including functions, settings, policies, and procedures.  Patient's individual exercise prescription and treatment plan were reviewed.  All starting workloads were established based on the results of the 6 minute walk test done at initial orientation visit.  The plan for exercise progression was also introduced and progression will be customized based on patient's performance and goals. 30 Day review completed. Medical Director ITP review done, changes made as directed, and signed approval by Medical Director. New patient new diagnosis.            Comments: 30 Day review completed. Medical Director ITP review done, changes made as directed, and signed approval by Medical Director. New patient new diagnosis.

## 2023-05-22 NOTE — Progress Notes (Signed)
 Cardiac Individual Treatment Plan  Patient Details  Name: Devin [name]. MRN: [id number] Date of Birth: Mar 31, 1945 Referring Provider:   Flowsheet Row Cardiac Rehab from 11/19/2022 in [facility] Cardiac and Pulmonary Rehab  Referring Provider Dr. Adrian Blackwater MD       Initial Encounter Date:  Flowsheet Row Cardiac Rehab from 11/19/2022 in [facility] Cardiac and Pulmonary Rehab  Date 11/19/22       Visit Diagnosis: NSTEMI (non-ST elevation myocardial infarction) [name])  Status post coronary artery stent placement  Patient's Home Medications on Admission:  Current Outpatient Medications:    albuterol (VENTOLIN HFA) 108 (90 Base) MCG/ACT inhaler, Inhale 2 puffs into the lungs every 6 (six) hours as needed for wheezing or shortness of breath., Disp: , Rfl:    amiodarone (PACERONE) 200 MG tablet, Take 200 mg by mouth daily., Disp: , Rfl:    amLODipine (NORVASC) 10 MG tablet, Take 1 tablet by mouth daily., Disp: , Rfl:    apixaban (ELIQUIS) 5 MG TABS tablet, Take 1 tablet (5 mg total) by mouth 2 (two) times daily., Disp: 60 tablet, Rfl: 3   atorvastatin (LIPITOR) 80 MG tablet, Take 80 mg by mouth daily., Disp: , Rfl:    buPROPion (WELLBUTRIN XL) 300 MG 24 hr tablet, Take 300 mg by mouth daily., Disp: , Rfl:    cephALEXin (KEFLEX) 500 MG capsule, Take 500 mg by mouth 3 (three) times daily. (Patient not taking: Reported on 04/26/2023), Disp: , Rfl:    clopidogrel (PLAVIX) 75 MG tablet, Take 1 tablet (75 mg total) by mouth daily with breakfast., Disp: 30 tablet, Rfl: 11   docusate (COLACE) 60 MG/15ML syrup, Take 60 mg by mouth daily., Disp: , Rfl:    EPINEPHrine 0.3 mg/0.3 mL IJ SOAJ injection, Inject 0.3 mg into the muscle as needed for anaphylaxis., Disp: , Rfl:    ezetimibe (ZETIA) 10 MG tablet, Take 10 mg by mouth daily., Disp: , Rfl:    fluticasone (FLONASE) 50 MCG/ACT nasal spray, SPRAY 2 SPRAYS INTO EACH NOSTRIL EVERY DAY, Disp: , Rfl:    gabapentin (NEURONTIN) 300 MG capsule, Take 300 mg  by mouth 2 (two) times daily., Disp: , Rfl:    loratadine (CLARITIN) 10 MG tablet, Take 10 mg by mouth daily., Disp: , Rfl:    metFORMIN (GLUCOPHAGE) 500 MG tablet, Take 1,000 mg by mouth 2 (two) times daily., Disp: , Rfl:    metoprolol succinate (TOPROL XL) 50 MG 24 hr tablet, Take 1 tablet (50 mg total) by mouth daily. Take with or immediately following a meal., Disp: 30 tablet, Rfl: 11   nitroGLYCERIN (NITROSTAT) 0.4 MG SL tablet, Place 0.4 mg under the tongue every 5 (five) minutes as needed for chest pain., Disp: , Rfl:    pantoprazole (PROTONIX) 40 MG tablet, TAKE 1 TABLET BY MOUTH EVERY DAY, Disp: 30 tablet, Rfl: 1   ranolazine (RANEXA) 500 MG 12 hr tablet, Take 1 tablet (500 mg total) by mouth 2 (two) times daily., Disp: 60 tablet, Rfl: 3   sacubitril-valsartan (ENTRESTO) 97-103 MG, TAKE 1 TABLET BY MOUTH TWICE A DAY, Disp: 180 tablet, Rfl: 1  Past Medical History: Past Medical History:  Diagnosis Date   AAA (abdominal aortic aneurysm) (HCC)    Anxiety    Aortic atherosclerosis (HCC)    Arthritis    Atrial fibrillation (HCC)    CAD (coronary artery disease)    CHF (congestive heart failure) (HCC)    Current use of long term anticoagulation    Apixaban  Depression    Diabetes mellitus without complication (HCC)    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   Hypertension    Ischemic cardiomyopathy    MI, old    Peripheral neuropathy    PFO (patent foramen ovale) 05/2019   s/p repair   Sleep apnea    Spinal stenosis of lumbar region    TIA (transient ischemic attack)     Tobacco Use: Social History   Tobacco Use  Smoking Status Former  Smokeless Tobacco Never  Tobacco Comments   Quit over 40 years ago    Labs: Review Flowsheet  More data exists      Latest Ref Rng & Units 04/08/2020 07/04/2020 04/12/2022 04/13/2022 10/13/2022  Labs for ITP Cardiac and Pulmonary Rehab  Cholestrol 0 - 200 mg/dL - - - - 161   LDL (calc) 0 - 99 mg/dL - - - - 35   HDL-C >09 mg/dL - - - - 59    Trlycerides <150 mg/dL - - - - 55   Hemoglobin A1c 4.8 - 5.6 % 5.8  - - 5.5  6.3   Bicarbonate 20.0 - 28.0 mmol/L - - 23.9  - -  TCO2 22 - 32 mmol/L - 24  - - -  Acid-base deficit 0.0 - 2.0 mmol/L - - 0.1  - -  O2 Saturation % - - 90.3  - -     Exercise Target Goals: Exercise Program Goal: Individual exercise prescription set using results from initial 6 min walk test and THRR while considering  patient's activity barriers and safety.   Exercise Prescription Goal: Initial exercise prescription builds to 30-45 minutes a day of aerobic activity, 2-3 days per week.  Home exercise guidelines will be given to patient during program as part of exercise prescription that the participant will acknowledge.   Education: Aerobic Exercise: - Group verbal and visual presentation on the components of exercise prescription. Introduces F.I.T.T principle from ACSM for exercise prescriptions.  Reviews F.I.T.T. principles of aerobic exercise including progression. Written material given at graduation. Flowsheet Row Cardiac Rehab from 11/19/2022 in [name] Cardiac [facility] Pul[facility] Rehab  Education need identified 11/19/22       Education: Resistance Exercise: - Group verbal and visual presentation on the components of exercise prescription. Introduces F.I.T.T principle from ACSM for exercise prescriptions  Reviews F.I.T.T. principles of resistance exercise including progression. Written material given at graduation.    Education: Exercise & Equipment Safety: - Individual verbal instruction and demonstration of equipment use and safety with use of the equipment. Flowsheet Row Cardiac Rehab from 11/19/2022 in [facility] Cardiac and Pulmonary Rehab  Date 11/19/22  Educator NT  Instruction Review Code 1- Verbalizes Understanding       Education: Exercise Physiology & General Exercise Guidelines: - Group verbal and written instruction with models to review the exercise physiology of the cardiovascular system and  associated critical values. Provides general exercise guidelines with specific guidelines to those with heart or lung disease.  Flowsheet Row Cardiac Rehab from 10/07/2019 in [name] Cardiac and Pulmonary Rehab  Date 09/23/19  Educator AS  Instruction Review Code 1- Verbalizes Understanding       Education: Flexibility, Balance, Mind/Body Relaxation: - Group verbal and visual presentation with interactive activity on the components of exercise prescription. Introduces F.I.T.T principle from ACSM for exercise prescriptions. Reviews F.I.T.T. principles of flexibility and balance exercise training including progression. Also discusses the mind body connection.  Reviews various relaxation techniques to help reduce and manage stress (i.e. Deep  breathing, progressive muscle relaxation, and visualization). Balance handout provided to take home. Written material given at graduation.   Activity Barriers & Risk Stratification:  Activity Barriers & Cardiac Risk Stratification - 05/20/23 1536       Activity Barriers & Cardiac Risk Stratification   Activity Barriers Right Knee Replacement;Left Knee Replacement;Muscular Weakness;Balance Concerns;Other (comment)    Comments drop foot, impaired gait, neuropathy    Cardiac Risk Stratification Moderate             6 Minute Walk:  6 Minute Walk     Row Name 05/09/23 1603 05/20/23 1535       6 Minute Walk   Phase Discharge Initial    Distance 720 feet 720 feet    Distance % Change 16 % --    Distance Feet Change 100 ft 100 ft    Walk Time 6 minutes 6 minutes    # of Rest Breaks 0 0    MPH 1.36 1.36    METS 1.13 1.13    RPE 13 13    Perceived Dyspnea  1 1    VO2 Peak 3.96 3.96    Symptoms No No    Resting HR 65 bpm 65 bpm    Resting BP 110/62 --    Resting Oxygen Saturation  95 % 95 %    Exercise Oxygen Saturation  during 6 min walk 95 % 95 %    Max Ex. HR 92 bpm 92 bpm    Max Ex. BP 122/64 122/64             Oxygen Initial  Assessment:   Oxygen Re-Evaluation:   Oxygen Discharge (Final Oxygen Re-Evaluation):   Initial Exercise Prescription:  Initial Exercise Prescription - 05/20/23 1500       Date of Initial Exercise RX and Referring Provider   Date 05/20/23    Referring Provider Dr. Adrian Blackwater      Oxygen   Maintain Oxygen Saturation 88% or higher      Treadmill   MPH 1      NuStep   Level 4   T6: 3   Minutes 15    METs 2.3      T5 Nustep   Level 4    Minutes 15    METs 3      Track   Laps 15    Minutes 15    METs 1.82      Resistance Training   Training Prescription Yes    Weight 4 lb    Reps 10-15             Perform Capillary Blood Glucose checks as needed.  Exercise Prescription Changes:   Exercise Prescription Changes     Row Name 12/13/22 0800 12/26/22 0800 01/09/23 0900 01/24/23 1400 02/06/23 0800     Response to Exercise   Blood Pressure (Admit) 126/58 108/56 122/60 112/58 104/60   Blood Pressure (Exercise) 152/66 142/70 144/64 -- --   Blood Pressure (Exit) 124/68 108/60 118/60 124/64 98/60   Heart Rate (Admit) 69 bpm 74 bpm 74 bpm 104 bpm 88 bpm   Heart Rate (Exercise) 104 bpm 95 bpm 91 bpm 108 bpm 97 bpm   Heart Rate (Exit) 69 bpm 68 bpm 83 bpm 95 bpm 75 bpm   Rating of Perceived Exertion (Exercise) 13 15 13 13 15    Perceived Dyspnea (Exercise) -- -- -- 0 --   Symptoms none none none none none   Comments First two weeks of  exercise -- -- -- --   Duration Continue with 30 min of aerobic exercise without signs/symptoms of physical distress. Continue with 30 min of aerobic exercise without signs/symptoms of physical distress. Continue with 30 min of aerobic exercise without signs/symptoms of physical distress. Continue with 30 min of aerobic exercise without signs/symptoms of physical distress. Continue with 30 min of aerobic exercise without signs/symptoms of physical distress.   Intensity THRR unchanged THRR unchanged THRR unchanged THRR unchanged THRR  unchanged     Progression   Progression Continue to progress workloads to maintain intensity without signs/symptoms of physical distress. Continue to progress workloads to maintain intensity without signs/symptoms of physical distress. Continue to progress workloads to maintain intensity without signs/symptoms of physical distress. Continue to progress workloads to maintain intensity without signs/symptoms of physical distress. Continue to progress workloads to maintain intensity without signs/symptoms of physical distress.   Average METs 2.29 2.09 2.53 -- 2.77     Resistance Training   Training Prescription Yes Yes Yes Yes Yes   Weight none  Pt's doctor did not give clearance for resistance training none  Pt's doctor did not give clearance for resistance training none  Pt's doctor did not give clearance for resistance training none  Pt's doctor did not give clearance for resistance training none  Pt's doctor did not give clearance for resistance training   Reps 10-15 10-15 10-15 10-15 10-15     Interval Training   Interval Training No No No No No     Treadmill   MPH 1.7 1.2 -- -- --   Grade 0 0 -- -- --   Minutes 15 15 -- -- --   METs 2.3 1.92 -- -- --     NuStep   Level 4 4 4  -- 5   Minutes 15 15 30  -- 15   METs 3.1 2.7 3 -- 2.6     Arm Ergometer   Level -- 1 -- -- --   Minutes -- 15 -- -- --   METs -- 1 -- -- --     T5 Nustep   Level -- -- 1 4 --   Minutes -- -- 15 15 --     Biostep-RELP   Level 1 -- -- -- --   Minutes 15 -- -- -- --   METs 2 -- -- -- --     Track   Laps 16 25 10 10 10    Minutes 15 15 15 15 15    METs 1.87 2.36 1.54 -- 1.54     Oxygen   Maintain Oxygen Saturation 88% or higher 88% or higher 88% or higher 88% or higher 88% or higher    Row Name 02/11/23 1600 02/20/23 1500 03/05/23 0900 03/21/23 1600 04/01/23 1600     Response to Exercise   Blood Pressure (Admit) -- 102/58 138/60 106/60 102/56   Blood Pressure (Exit) -- 100/54 98/52 118/60 102/54    Heart Rate (Admit) -- 72 bpm 57 bpm 81 bpm 76 bpm   Heart Rate (Exercise) -- 85 bpm 81 bpm 112 bpm 87 bpm   Heart Rate (Exit) -- 82 bpm 60 bpm 59 bpm 74 bpm   Rating of Perceived Exertion (Exercise) -- 15 15 15 15    Symptoms -- none none none none   Duration Continue with 30 min of aerobic exercise without signs/symptoms of physical distress. Continue with 30 min of aerobic exercise without signs/symptoms of physical distress. Continue with 30 min of aerobic exercise without signs/symptoms of physical distress.  Continue with 30 min of aerobic exercise without signs/symptoms of physical distress. Continue with 30 min of aerobic exercise without signs/symptoms of physical distress.   Intensity THRR unchanged THRR unchanged THRR unchanged THRR unchanged THRR unchanged     Progression   Progression Continue to progress workloads to maintain intensity without signs/symptoms of physical distress. Continue to progress workloads to maintain intensity without signs/symptoms of physical distress. Continue to progress workloads to maintain intensity without signs/symptoms of physical distress. Continue to progress workloads to maintain intensity without signs/symptoms of physical distress. Continue to progress workloads to maintain intensity without signs/symptoms of physical distress.   Average METs 2.77 2.13 2.34 2.05 1.99     Resistance Training   Training Prescription Yes Yes Yes Yes Yes   Weight none  Pt's doctor did not give clearance for resistance training none  Pt's doctor did not give clearance for resistance training none  Pt's doctor did not give clearance for resistance training none  Pt's doctor did not give clearance for resistance training none  Pt's doctor did not give clearance for resistance training   Reps 10-15 10-15 10-15 10-15 10-15     Interval Training   Interval Training No No No No No     Treadmill   MPH -- -- -- -- 1   Grade -- -- -- -- 0   Minutes -- -- -- -- 15   METs -- --  -- -- 1.77     NuStep   Level 5 4 3 4 3   T6 nustep: level 3   Minutes 15 15 15 15 15    METs 2.6 2.6 3 2.5 2.3  T6 nustep: 1.9 METs     T5 Nustep   Level -- -- 3  T6 -- --   Minutes -- -- 15 -- --   METs -- -- 2.2 -- --     Track   Laps 10 3 15 11  --   Minutes 15 15 15 15  --   METs 1.54 1.16 1.82 1.6 --     Home Exercise Plan   Plans to continue exercise at Home (comment)  walking Home (comment)  walking Home (comment)  walking Home (comment)  walking Home (comment)  walking   Frequency Add 2 additional days to program exercise sessions. Add 2 additional days to program exercise sessions. Add 2 additional days to program exercise sessions. Add 2 additional days to program exercise sessions. Add 2 additional days to program exercise sessions.   Initial Home Exercises Provided 02/11/23 02/11/23 02/11/23 02/11/23 02/11/23     Oxygen   Maintain Oxygen Saturation 88% or higher 88% or higher 88% or higher 88% or higher 88% or higher    Row Name 04/16/23 1400 05/01/23 1000 05/15/23 0700         Response to Exercise   Blood Pressure (Admit) 92/58 116/60 110/62     Blood Pressure (Exit) 108/62 134/64 102/58     Heart Rate (Admit) 59 bpm 62 bpm 65 bpm     Heart Rate (Exercise) 107 bpm 118 bpm 97 bpm     Heart Rate (Exit) 71 bpm 88 bpm 63 bpm     Rating of Perceived Exertion (Exercise) 15 13 13      Symptoms none none none     Duration Continue with 30 min of aerobic exercise without signs/symptoms of physical distress. Continue with 30 min of aerobic exercise without signs/symptoms of physical distress. Continue with 30 min of aerobic exercise without signs/symptoms of physical  distress.     Intensity THRR unchanged THRR unchanged THRR unchanged       Progression   Progression Continue to progress workloads to maintain intensity without signs/symptoms of physical distress. Continue to progress workloads to maintain intensity without signs/symptoms of physical distress. Continue to  progress workloads to maintain intensity without signs/symptoms of physical distress.     Average METs 2.1 2.15 2.04       Resistance Training   Training Prescription Yes Yes Yes     Weight 4 lb 4 lb 4 lb     Reps 10-15 10-15 10-15       Interval Training   Interval Training No No No       Treadmill   MPH 1.5 -- --     Grade 0 -- --     Minutes 15 -- --     METs 2.15 -- --       NuStep   Level 4  T6: level 4 3  T6 nustep 4  T6: 3     Minutes 15 30 15      METs 2.3  T6: 1.8 METs 2.2 2.3       T5 Nustep   Level -- -- 4     Minutes -- -- 15     METs -- -- 3       Track   Laps -- -- 15     Minutes -- -- 15     METs -- -- 1.82       Home Exercise Plan   Plans to continue exercise at Home (comment)  walking Home (comment)  walking Home (comment)  walking     Frequency Add 2 additional days to program exercise sessions. Add 2 additional days to program exercise sessions. Add 2 additional days to program exercise sessions.     Initial Home Exercises Provided 02/11/23 02/11/23 02/11/23       Oxygen   Maintain Oxygen Saturation 88% or higher 88% or higher 88% or higher              Exercise Comments:   Exercise Comments     Row Name 11/27/22 0941 05/20/23 1537         Exercise Comments First full day of exercise!  Patient was oriented to gym and equipment including functions, settings, policies, and procedures.  Patient's individual exercise prescription and treatment plan were reviewed.  All starting workloads were established based on the results of the 6 minute walk test done at initial orientation visit.  The plan for exercise progression was also introduced and progression will be customized based on patient's performance and goals.  AAA parameters  set by his physician explained to patient. First full day of exercise!  Patient was oriented to gym and equipment including functions, settings, policies, and procedures.  Patient's individual exercise prescription and  treatment plan were reviewed.  All starting workloads were established based on the results of the 6 minute walk test done at initial orientation visit.  The plan for exercise progression was also introduced and progression will be customized based on patient's performance and goals.               Exercise Goals and Review:   Exercise Goals     Row Name 05/20/23 1536             Exercise Goals   Increase Physical Activity Yes       Intervention Develop an individualized exercise prescription for aerobic  and resistive training based on initial evaluation findings, risk stratification, comorbidities and participant's personal goals.;Provide advice, education, support and counseling about physical activity/exercise needs.       Expected Outcomes Long Term: Add in home exercise to make exercise part of routine and to increase amount of physical activity.;Long Term: Exercising regularly at least 3-5 days a week.;Short Term: Attend rehab on a regular basis to increase amount of physical activity.       Able to understand and use rate of perceived exertion (RPE) scale Yes       Intervention Provide education and explanation on how to use RPE scale       Expected Outcomes Short Term: Able to use RPE daily in rehab to express subjective intensity level;Long Term:  Able to use RPE to guide intensity level when exercising independently       Able to understand and use Dyspnea scale Yes       Intervention Provide education and explanation on how to use Dyspnea scale       Expected Outcomes Short Term: Able to use Dyspnea scale daily in rehab to express subjective sense of shortness of breath during exertion;Long Term: Able to use Dyspnea scale to guide intensity level when exercising independently       Knowledge and understanding of Target Heart Rate Range (THRR) Yes       Intervention Provide education and explanation of THRR including how the numbers were predicted and where they are located for  reference       Expected Outcomes Short Term: Able to state/look up THRR;Short Term: Able to use daily as guideline for intensity in rehab;Long Term: Able to use THRR to govern intensity when exercising independently       Able to check pulse independently Yes       Intervention Provide education and demonstration on how to check pulse in carotid and radial arteries.;Review the importance of being able to check your own pulse for safety during independent exercise       Expected Outcomes Short Term: Able to explain why pulse checking is important during independent exercise;Long Term: Able to check pulse independently and accurately       Understanding of Exercise Prescription Yes       Intervention Provide education, explanation, and written materials on patient's individual exercise prescription       Expected Outcomes Short Term: Able to explain program exercise prescription;Long Term: Able to explain home exercise prescription to exercise independently                Exercise Goals Re-Evaluation :  Exercise Goals Re-Evaluation     Row Name 11/27/22 0941 12/13/22 0818 12/26/22 0804 01/09/23 0941 01/15/23 0942     Exercise Goal Re-Evaluation   Exercise Goals Review Able to understand and use rate of perceived exertion (RPE) scale;Knowledge and understanding of Target Heart Rate Range (THRR);Understanding of Exercise Prescription;Able to understand and use Dyspnea scale Increase Physical Activity;Increase Strength and Stamina;Understanding of Exercise Prescription Increase Physical Activity;Increase Strength and Stamina;Understanding of Exercise Prescription Increase Physical Activity;Increase Strength and Stamina;Understanding of Exercise Prescription Increase Physical Activity;Increase Strength and Stamina;Understanding of Exercise Prescription   Comments Reviewed RPE  and dyspnea scale, THR and program prescription with pt today.  Pt voiced understanding and was given a copy of goals to  take home. Devin [name] is off to a good start in the program. He has done well on the treadmill so far, as he increased his workload up to 1.7  mph with no incline. He also has done well at level one on the biostep and improved to level 4 on the T4 nustep. He has not any done any resistance training at this time due to not receiving clearance from his doctor. We will continue to monitor his progress in the program. Devin [name] is doing well in rehab. He has continued to walk the track and increased from 16 laps to 25 laps! He also has has continued to do well at level 4 on the T4 nustep and level 1 on the arm ergometer. He has not done any resistance training at this time due to doctor's restrictions. We will continue to monitor his progress in the program. Devin [name] is doing well in rehab. He has only walked the track once since the last review and was able to do 10 laps. He also continues to work at level 4 on the T4 nustep but was able to exercise for 30 minutes. He also began using the T5 nustep at level 1. We will continue to monitor his progress in the program. He is doing well at reahb on level 4 on T4 and T5. He goes to PT 2 times per week. Otherwise his exercise at home is limited. Encouraged him to continue to work on strength and balance.   Expected Outcomes Short: Use RPE daily to regulate intensity. Long: Follow program prescription in THR. Short:Continue to follow current exercise prescription. Long: Continue exercise to improve strength and stamina. Short: Increase workload on arm ergometer. Long: Continue exercise to improve strength and stamina. Short: Increase laps on the track back up to previous number. Long: Continue exercise to improve strength and stamina. Short: walk more at home, imporve strength and balance. Long: Continue to work on Adult nurse.    Row Name 01/24/23 1448 02/06/23 0814 02/11/23 1612 02/20/23 1550 03/05/23 0951     Exercise Goal Re-Evaluation   Exercise Goals Review Increase Physical  Activity;Increase Strength and Stamina;Understanding of Exercise Prescription Increase Physical Activity;Increase Strength and Stamina;Understanding of Exercise Prescription Increase Physical Activity;Able to understand and use rate of perceived exertion (RPE) scale;Knowledge and understanding of Target Heart Rate Range (THRR);Understanding of Exercise Prescription;Increase Strength and Stamina;Able to understand and use Dyspnea scale;Able to check pulse independently Increase Physical Activity;Increase Strength and Stamina;Understanding of Exercise Prescription Increase Physical Activity;Increase Strength and Stamina;Understanding of Exercise Prescription   Comments Devin [name] continues to do well in rehab. He was recently able to increase his level on the T5 nustep from level 1 to 4. He was also able to maintain 10 track laps in 15 minutes. We will continue to monitor his progress in the program. Devin [name] has only attended two sessions since the last review. He continues to do well walking the track and has consistently reached 10 laps. He also recently improved to level 5 on the T4 nustep. We will continue to monitor his progress in the program. Reviewed home exercise with pt today.  Pt plans to walk and continue with physical therapy for exercise.  Reviewed THR, pulse, RPE, sign and symptoms, pulse oximetery and when to call 911 or MD.  Also discussed weather considerations and indoor options.  Pt voiced understanding. Devin [name] continues to attend rehab consistently. He has only walked the track once since the last review and was only able to do 3 laps. He also continues to work at level 4 on the T4 nustep. We will encourage him to continue to progressively increase his workloads in order to see progress in the program.  We will continue to monitor his progress. Devin [name] is doing well in rehab. He was able to increase his laps on the track to 15. He worked at level 3 on the T4 and T6 nusteps. We are continuing to encourage him to  progessively increase his workloads in order to see progress in the program. We will continue to monitor his progress in the program.   Expected Outcomes Short: Increase track laps, return to using the T4 nustep. Long: Continue exercise to improve strength and stamina. Short: Attend rehab more consistently. Long: Continue exercise to improve strength and stamina. Short: add 1-2 days a week of walking on off days of cardiac rehab and PT. Long: become independent with exercise routine. Short: Continue to progressively increase workloads and push for more laps on the track. Long: Continue exercise to improve strength and stamina. Short: Continue to progressively increase workloads on the track and nusteps. Long: Continue exercise to improve strength and stamina.    Row Name 03/14/23 1551 03/21/23 1607 04/01/23 1623 04/16/23 1440 05/01/23 1043     Exercise Goal Re-Evaluation   Exercise Goals Review Increase Physical Activity;Increase Strength and Stamina;Understanding of Exercise Prescription Increase Physical Activity;Increase Strength and Stamina;Understanding of Exercise Prescription Increase Physical Activity;Increase Strength and Stamina;Understanding of Exercise Prescription Increase Physical Activity;Increase Strength and Stamina;Understanding of Exercise Prescription Increase Physical Activity;Increase Strength and Stamina;Understanding of Exercise Prescription   Comments Devin [name] continues to walk outside around his neighborhood on days that he doesnt come to rehab or PT. He states that he tries to do it when he feels comfortable with his balance. Devin [name] is doing well in rehab. He only uses one machine each time he comes but has done well walking the track and using the T4 nustep at level 4. He also has not done any resistance training due to lifting restrictions. We will continue to monitor his progress in the program. Devin [name] is doing well in rehab. He recently began walking the treadmill at a speed of 1 mph with no  incline. He also continues to work at level 3 on both the T4 nustep and T6 nustep. We will continue to monitor his progress in the program. Devin [name] continues to do well in the program. He recently increased his treadmill speed to 1.5 mph. He also improved to level 4 on the T6 nustep and continues to work at level 4 on the T4 nustep. We will continue to monitor his progress in the program. Devin [name] has only attended one session since the last review. He continues to work on the T6 nustep at level 3 for 30 minutes. We will encourage him to attend rehab more consistently to see results from exercise. We will continue to monitor his progress in the program.   Expected Outcomes Short: Continue to exercise at home. Long: Continue exercise to improve strength and stamina. Short: Continue to to push for more laps on the track. Long: Continue exercise to improve strength and stamina. Short: Increase to level 4 on the T4 and T6 nustep machines. Long: Continue exercise to improve strength and stamina. Short: Increase to level 5 on the T4 nustep. Long: Continue exercise to improve strength and stamina. Short: Attend rehab more consistently. Long: Continue exercise to improve strength and stamina.    Row Name 05/15/23 0803             Exercise Goal Re-Evaluation   Exercise Goals Review Increase Physical Activity;Increase Strength and Stamina;Understanding of Exercise Prescription       Comments Devin [name] is  doing well and is close to graduating from the program. He recently walked back up to 15 laps on the track and improved to level 4 on the T5 nustep. He also completed his post and improved by 16%. We will continue to monitor his progress until he graduates from the program.                Discharge Exercise Prescription (Final Exercise Prescription Changes):  Exercise Prescription Changes - 05/15/23 0700       Response to Exercise   Blood Pressure (Admit) 110/62    Blood Pressure (Exit) 102/58    Heart Rate  (Admit) 65 bpm    Heart Rate (Exercise) 97 bpm    Heart Rate (Exit) 63 bpm    Rating of Perceived Exertion (Exercise) 13    Symptoms none    Duration Continue with 30 min of aerobic exercise without signs/symptoms of physical distress.    Intensity THRR unchanged      Progression   Progression Continue to progress workloads to maintain intensity without signs/symptoms of physical distress.    Average METs 2.04      Resistance Training   Training Prescription Yes    Weight 4 lb    Reps 10-15      Interval Training   Interval Training No      NuStep   Level 4   T6: 3   Minutes 15    METs 2.3      T5 Nustep   Level 4    Minutes 15    METs 3      Track   Laps 15    Minutes 15    METs 1.82      Home Exercise Plan   Plans to continue exercise at Home (comment)   walking   Frequency Add 2 additional days to program exercise sessions.    Initial Home Exercises Provided 02/11/23      Oxygen   Maintain Oxygen Saturation 88% or higher             Nutrition:  Target Goals: Understanding of nutrition guidelines, daily intake of sodium 1500mg , cholesterol 200mg , calories 30% from fat and 7% or less from saturated fats, daily to have 5 or more servings of fruits and vegetables.  Education: All About Nutrition: -Group instruction provided by verbal, written material, interactive activities, discussions, models, and posters to present general guidelines for heart healthy nutrition including fat, fiber, MyPlate, the role of sodium in heart healthy nutrition, utilization of the nutrition label, and utilization of this knowledge for meal planning. Follow up email sent as well. Written material given at graduation. Flowsheet Row Cardiac Rehab from 11/19/2022 in [facility] Cardiac [facility] Pul[facility] Rehab  Education need identified 11/19/22       Biometrics:   Post Biometrics - 05/09/23 1605        Post  Biometrics   Height 6' (1.829 m)    Weight 222 lb (100.7 kg)    BMI  (Calculated) 30.1    Single Leg Stand 2 seconds             Nutrition Therapy Plan and Nutrition Goals:  Nutrition Therapy & Goals - 11/27/22 1131       Nutrition Therapy   Diet Carb controlled, Cardiac, low na    Protein (specify units) 90    Fiber 30 grams    Whole Grain Foods 3 servings    Saturated Fats 15 max. grams    Fruits  and Vegetables 5 servings/day    Sodium 2 grams      Personal Nutrition Goals   Nutrition Goal Eat a protein at every meal and pair it with a carb    Personal Goal #2 Use protein snacks if needed    Personal Goal #3 Use sweets smartly, and make good snack choices    Comments patient drinking >64oz of water most days. Rarely drinks sugary drinks. He eats 3 meals per day. Has veteran in-home assistance to help him make meals. Likes to go out to restaurants for social interaction, but tries to make good food choices. Reviewed mediterranean diet handout. Educated on types of fats, sources, and how to read labels. He reports he doesn't use salt at home, commended him and educated about sodium limits, how processed foods can have lots in them before arriving to the table. Reviewed his 24hr food recall, encouraged more protein. Recommended greek yogurt, cottage cheese, hard boiled eggs, tuna, chicken, premier protein shakes as ways to boost protein intake. Build out several meals and snacks with foods he likes and will eat, focusing on adequate protein intake, healthy fats and colorful plates with controlled portions of carbs.      Intervention Plan   Intervention Prescribe, educate and counsel regarding individualized specific dietary modifications aiming towards targeted core components such as weight, hypertension, lipid management, diabetes, heart failure and other comorbidities.;Nutrition handout(s) given to patient.    Expected Outcomes Long Term Goal: Adherence to prescribed nutrition plan.;Short Term Goal: A plan has been developed with personal nutrition  goals set during dietitian appointment.;Short Term Goal: Understand basic principles of dietary content, such as calories, fat, sodium, cholesterol and nutrients.             Nutrition Assessments:  MEDIFICTS Score Key: >=70 Need to make dietary changes  40-70 Heart Healthy Diet <= 40 Therapeutic Level Cholesterol Diet  Flowsheet Row Cardiac Rehab from 05/16/2023 in [facility] Cardiac and Pulmonary Rehab  Picture Your Plate Total Score on Discharge 64      Picture Your Plate Scores: <21 Unhealthy dietary pattern with much room for improvement. 41-50 Dietary pattern unlikely to meet recommendations for good health and room for improvement. 51-60 More healthful dietary pattern, with some room for improvement.  >60 Healthy dietary pattern, although there may be some specific behaviors that could be improved.    Nutrition Goals Re-Evaluation:  Nutrition Goals Re-Evaluation     Row Name 01/15/23 0945 03/14/23 1604           Goals   Comment He reports he is doing well with his foods, including more veggies and lean meats. Cutting back on fatty beef and fried foods. He reports he is snacking less and choosing more colorful produce. He states that he is eating relatively well. He said he struggles to find meals to cook for himself, so he often goes out, but he claims that he does not eat fried foods.      Expected Outcome Short: continue to limit fatty meats and sugary beverages. Long: Work towards maintain heart healthy diet Short: continue to limit fatty meats and sugary beverages. Long: Work towards maintain heart healthy diet               Nutrition Goals Discharge (Final Nutrition Goals Re-Evaluation):  Nutrition Goals Re-Evaluation - 03/14/23 1604       Goals   Comment He states that he is eating relatively well. He said he struggles to find meals to cook for  himself, so he often goes out, but he claims that he does not eat fried foods.    Expected Outcome Short: continue to  limit fatty meats and sugary beverages. Long: Work towards maintain heart healthy diet             Psychosocial: Target Goals: Acknowledge presence or absence of significant depression and/or stress, maximize coping skills, provide positive support system. Participant is able to verbalize types and ability to use techniques and skills needed for reducing stress and depression.   Education: Stress, Anxiety, and Depression - Group verbal and visual presentation to define topics covered.  Reviews how body is impacted by stress, anxiety, and depression.  Also discusses healthy ways to reduce stress and to treat/manage anxiety and depression.  Written material given at graduation.   Education: Sleep Hygiene -Provides group verbal and written instruction about how sleep can affect your health.  Define sleep hygiene, discuss sleep cycles and impact of sleep habits. Review good sleep hygiene tips.    Initial Review & Psychosocial Screening:  Initial Psych Review & Screening - 05/20/23 1520       Initial Review   Current issues with None Identified      Family Dynamics   Good Support System? Yes      Barriers   Psychosocial barriers to participate in program There are no identifiable barriers or psychosocial needs.      Screening Interventions   Interventions Encouraged to exercise;To provide support and resources with identified psychosocial needs;Provide feedback about the scores to participant    Expected Outcomes Short Term goal: Utilizing psychosocial counselor, staff and physician to assist with identification of specific Stressors or current issues interfering with healing process. Setting desired goal for each stressor or current issue identified.;Long Term Goal: Stressors or current issues are controlled or eliminated.;Short Term goal: Identification and review with participant of any Quality of Life or Depression concerns found by scoring the questionnaire.;Long Term goal: The  participant improves quality of Life and PHQ9 Scores as seen by post scores and/or verbalization of changes             Quality of Life Scores:   Quality of Life - 05/20/23 1523       Quality of Life   Select Quality of Life      Quality of Life Scores   Health/Function Pre 27.6 %    Socioeconomic Pre 28 %    Psych/Spiritual Pre 30 %    Family Pre 27 %    GLOBAL Pre 28.13 %            Scores of 19 and below usually indicate a poorer quality of life in these areas.  A difference of  2-3 points is a clinically meaningful difference.  A difference of 2-3 points in the total score of the Quality of Life Index has been associated with significant improvement in overall quality of life, self-image, physical symptoms, and general health in studies assessing change in quality of life.  PHQ-9: Review Flowsheet  More data exists      05/16/2023 11/19/2022 01/12/2020 10/28/2019 09/09/2019  Depression screen PHQ 2/9  Decreased Interest 0 1 3 2 1   Down, Depressed, Hopeless 0 1 0 0 0  PHQ - 2 Score 0 2 3 2 1   Altered sleeping 0 1 1 3 3   Tired, decreased energy 3 1 2 2  0  Change in appetite 3 1 1 1  0  Feeling bad or failure about yourself  0  0 0 0 0  Trouble concentrating 0 0 0 1 0  Moving slowly or fidgety/restless 0 0 0 1 0  Suicidal thoughts 0 0 1 0 0  PHQ-9 Score 6 5 8 10 4   Difficult doing work/chores Not difficult at all Somewhat difficult Not difficult at all Somewhat difficult Not difficult at all   Interpretation of Total Score  Total Score Depression Severity:  1-4 = Minimal depression, 5-9 = Mild depression, 10-14 = Moderate depression, 15-19 = Moderately severe depression, 20-27 = Severe depression   Psychosocial Evaluation and Intervention:  Psychosocial Evaluation - 05/20/23 1520       Psychosocial Evaluation & Interventions   Interventions Encouraged to exercise with the program and follow exercise prescription    Comments Devin [name] is returning to 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 He is motivated to attend the program and has no barriers to attending. He has completed PT for balance and plans to increase his stamina in the program. He relies on his lady friend for support and company    Expected Outcomes Short: attend cardiac rehab for education and exercise Long: develop and maintain positive self care habits    Continue Psychosocial Services  Follow up required by staff             Psychosocial Re-Evaluation:  Psychosocial Re-Evaluation     Row Name 01/15/23 0948 03/14/23 1558           Psychosocial Re-Evaluation   Current issues with Current Sleep Concerns Current Sleep Concerns      Comments Reports his sleep has not been good, taking medication and says it helps him. Otherwise he reports no stress or anxiety or depression. Reports he is complinat with his anti-depressant medication Devin [name] still has trouble sleeping, he was prescribed sleeping medication but states that it made him hallucinate so he stopped. HE claims that he has no current stressors at this time. He states that he still takes his anti-depressant medication.      Expected Outcomes Short: focus on maintain regular sleep habits and mental health. Long: Maintain positvie attitude Short: focus on maintain regular sleep habits and mental health. Long: Maintain positvie attitude      Interventions Encouraged to attend Cardiac Rehabilitation for the exercise Encouraged to attend Cardiac Rehabilitation for the exercise      Continue Psychosocial Services  Follow up required by staff --               Psychosocial Discharge (Final Psychosocial Re-Evaluation):  Psychosocial Re-Evaluation - 03/14/23 1558       Psychosocial Re-Evaluation   Current issues with Current Sleep Concerns    Comments Devin [name] still has trouble sleeping, he was prescribed sleeping medication but states that it made him hallucinate so he stopped. HE claims that he has no current stressors at this time. He  states that he still takes his anti-depressant medication.    Expected Outcomes Short: focus on maintain regular sleep habits and mental health. Long: Maintain positvie attitude    Interventions Encouraged to attend Cardiac Rehabilitation for the exercise             Vocational Rehabilitation: Provide vocational rehab assistance to qualifying candidates.   Vocational Rehab Evaluation & Intervention:  Vocational Rehab - 05/20/23 1519       Initial Vocational Rehab Evaluation & Intervention   Assessment shows need for Vocational Rehabilitation No             Education: Education Goals: Education classes  will be provided on a variety of topics geared toward better understanding of heart health and risk factor modification. Participant will state understanding/return demonstration of topics presented as noted by education test scores.  Learning Barriers/Preferences:   General Cardiac Education Topics:  AED/CPR: - Group verbal and written instruction with the use of models to demonstrate the basic use of the AED with the basic ABC's of resuscitation.   Anatomy and Cardiac Procedures: - Group verbal and visual presentation and models provide information about basic cardiac anatomy and function. Reviews the testing methods done to diagnose heart disease and the outcomes of the test results. Describes the treatment choices: Medical Management, Angioplasty, or Coronary Bypass Surgery for treating various heart conditions including Myocardial Infarction, Angina, Valve Disease, and Cardiac Arrhythmias.  Written material given at graduation. Flowsheet Row Cardiac Rehab from 11/19/2022 in [facility] Cardiac and Pulmonary Rehab  Education need identified 11/19/22       Medication Safety: - Group verbal and visual instruction to review commonly prescribed medications for heart and lung disease. Reviews the medication, class of the drug, and side effects. Includes the steps to properly store  meds and maintain the prescription regimen.  Written material given at graduation. Flowsheet Row Cardiac Rehab from 10/07/2019 in [name] Cardiac and Pulmonary Rehab  Date 10/07/19  Educator SB  Instruction Review Code 1- Verbalizes Understanding       Intimacy: - Group verbal instruction through game format to discuss how heart and lung disease can affect sexual intimacy. Written material given at graduation..   Know Your Numbers and Heart Failure: - Group verbal and visual instruction to discuss disease risk factors for cardiac and pulmonary disease and treatment options.  Reviews associated critical values for Overweight/Obesity, Hypertension, Cholesterol, and Diabetes.  Discusses basics of heart failure: signs/symptoms and treatments.  Introduces Heart Failure Zone chart for action plan for heart failure.  Written material given at graduation.   Infection Prevention: - Provides verbal and written material to individual with discussion of infection control including proper hand washing and proper equipment cleaning during exercise session. Flowsheet Row Cardiac Rehab from 11/19/2022 in [facility] Cardiac and Pulmonary Rehab  Date 11/19/22  Educator NT  Instruction Review Code 1- Verbalizes Understanding       Falls Prevention: - Provides verbal and written material to individual with discussion of falls prevention and safety. Flowsheet Row Cardiac Rehab from 11/19/2022 in [name] Cardiac and Pulmonary Rehab  Date 11/19/22  Educator NT  Instruction Review Code 1- Verbalizes Understanding       Other: -Provides group and verbal instruction on various topics (see comments)   Knowledge Questionnaire Score:  Knowledge Questionnaire Score - 05/20/23 1519       Knowledge Questionnaire Score   Pre Score 24/26             Core Components/Risk Factors/Patient Goals at Admission:  Personal Goals and Risk Factors at Admission - 05/20/23 1520       Core Components/Risk Factors/Patient  Goals on Admission    Weight Management Yes;Weight Maintenance    Intervention Weight Management: Develop a combined nutrition and exercise program designed to reach desired caloric intake, while maintaining appropriate intake of nutrient and fiber, sodium and fats, and appropriate energy expenditure required for the weight goal.;Weight Management: Provide education and appropriate resources to help participant work on and attain dietary goals.    Expected Outcomes Short Term: Continue to assess and modify interventions until short term weight is achieved;Long Term: Adherence to nutrition and physical  activity/exercise program aimed toward attainment of established weight goal;Weight Maintenance: Understanding of the daily nutrition guidelines, which includes 25-35% calories from fat, 7% or less cal from saturated fats, less than 200mg  cholesterol, less than 1.5gm of sodium, & 5 or more servings of fruits and vegetables daily    Diabetes Yes    Intervention Provide education about signs/symptoms and action to take for hypo/hyperglycemia.;Provide education about proper nutrition, including hydration, and aerobic/resistive exercise prescription along with prescribed medications to achieve blood glucose in normal ranges: Fasting glucose 65-99 mg/dL    Expected Outcomes Short Term: Participant verbalizes understanding of the signs/symptoms and immediate care of hyper/hypoglycemia, proper foot care and importance of medication, aerobic/resistive exercise and nutrition plan for blood glucose control.;Long Term: Attainment of HbA1C < 7%.    Heart Failure Yes    Intervention Provide a combined exercise and nutrition program that is supplemented with education, support and counseling about heart failure. Directed toward relieving symptoms such as shortness of breath, decreased exercise tolerance, and extremity edema.    Expected Outcomes Improve functional capacity of life;Short term: Attendance in program 2-3 days  a week with increased exercise capacity. Reported lower sodium intake. Reported increased fruit and vegetable intake. Reports medication compliance.;Short term: Daily weights obtained and reported for increase. Utilizing diuretic protocols set by physician.;Long term: Adoption of self-care skills and reduction of barriers for early signs and symptoms recognition and intervention leading to self-care maintenance.    Hypertension Yes    Intervention Provide education on lifestyle modifcations including regular physical activity/exercise, weight management, moderate sodium restriction and increased consumption of fresh fruit, vegetables, and low fat dairy, alcohol moderation, and smoking cessation.;Monitor prescription use compliance.    Expected Outcomes Short Term: Continued assessment and intervention until BP is < 140/80mm HG in hypertensive participants. < 130/42mm HG in hypertensive participants with diabetes, heart failure or chronic kidney disease.;Long Term: Maintenance of blood pressure at goal levels.    Lipids Yes    Intervention Provide education and support for participant on nutrition & aerobic/resistive exercise along with prescribed medications to achieve LDL 70mg , HDL >40mg .    Expected Outcomes Short Term: Participant states understanding of desired cholesterol values and is compliant with medications prescribed. Participant is following exercise prescription and nutrition guidelines.;Long Term: Cholesterol controlled with medications as prescribed, with individualized exercise RX and with personalized nutrition plan. Value goals: LDL < 70mg , HDL > 40 mg.             Education:Diabetes - Individual verbal and written instruction to review signs/symptoms of diabetes, desired ranges of glucose level fasting, after meals and with exercise. Acknowledge that pre and post exercise glucose checks will be done for 3 sessions at entry of program. Flowsheet Row Cardiac Rehab from 01/13/2020 in  [facility]  Date 01/13/20  Educator [name]  Instruction Review Code 1- Verbalizes Understanding       Core Components/Risk Factors/Patient Goals Review:   Goals and Risk Factor Review     Row Name 01/15/23 0951 03/14/23 1611           Core Components/Risk Factors/Patient Goals Review   Personal Goals Review Weight Management/Obesity;Hypertension;Diabetes;Lipids Weight Management/Obesity;Hypertension;Diabetes;Lipids      Review He continue to work on watching his weight, checking his blood sugars now back on metformin. He is checking his blood pressure at home and it has been stable He continues to strive for a heart healthy diet and continue to get exercise in order to manage his weight. Devin [name]  is still taking his blood pressure daily, and checking his blood sugar, he is currently experiencing high blood sugar and is meeting with his doctor about that soon.      Expected Outcomes Short: continue to check blood sugars and blood pressure. Long: Work towards healthy weight and more stable A1C and blood pressures Short: continue to check blood sugars and blood pressure. Long: Work towards healthy weight and more stable A1C and blood pressures               Core Components/Risk Factors/Patient Goals at Discharge (Final Review):   Goals and Risk Factor Review - 03/14/23 1611       Core Components/Risk Factors/Patient Goals Review   Personal Goals Review Weight Management/Obesity;Hypertension;Diabetes;Lipids    Review He continues to strive for a heart healthy diet and continue to get exercise in order to manage his weight. Devin [name] is still taking his blood pressure daily, and checking his blood sugar, he is currently experiencing high blood sugar and is meeting with his doctor about that soon.    Expected Outcomes Short: continue to check blood sugars and blood pressure. Long: Work towards healthy weight and more stable A1C and blood pressures             ITP Comments:   ITP Comments     Row Name 11/27/22 0939 12/12/22 1229 01/09/23 1516 01/30/23 1115 02/27/23 1134   ITP Comments First full day of exercise!  Patient was oriented to gym and equipment including functions, settings, policies, and procedures.  Patient's individual exercise prescription and treatment plan were reviewed.  All starting workloads were established based on the results of the 6 minute walk test done at initial orientation visit.  The plan for exercise progression was also introduced and progression will be customized based on patient's performance and goals. AAA parameters  set by his physician explained to patient. 30 Day review completed. Medical Director ITP review done, changes made as directed, and signed approval by Medical Director.    new to program 30 Day review completed. Medical Director ITP review done, changes made as directed, and signed approval by Medical Director. 30 Day review completed. Medical Director ITP review done, changes made as directed, and signed approval by Medical Director. 30 Day review completed. Medical Director ITP review done, changes made as directed, and signed approval by Medical Director.    Row Name 03/27/23 1459 04/24/23 0800 05/16/23 1553 05/20/23 1537 05/22/23 0754   ITP Comments 30 Day review completed. Medical Director ITP review done, changes made as directed, and signed approval by Medical Director. 30 Day review completed. Medical Director ITP review done, changes made as directed, and signed approval by Medical Director. Devin [name] graduated today from  rehab with 36 sessions completed.  Details of the patient's exercise prescription and what He needs to do in order to continue the prescription and progress were discussed with patient.  Patient was given a copy of prescription and goals.  Patient verbalized understanding. Devin [name] plans to continue to exercise by attending structured exercise program. First full day of exercise!  Patient was oriented to gym  and equipment including functions, settings, policies, and procedures.  Patient's individual exercise prescription and treatment plan were reviewed.  All starting workloads were established based on the results of the 6 minute walk test done at initial orientation visit.  The plan for exercise progression was also introduced and progression will be customized based on patient's performance and goals. 30 Day review completed. Medical Director  ITP review done, changes made as directed, and signed approval by Medical Director. New patient new diagnosis.            Comments: DISCHARGE ITP

## 2023-05-22 NOTE — Progress Notes (Signed)
 Cardiac Individual Treatment Plan  Patient Details  Name: Devin [name] [facility]. MRN: [id number] Date of Birth: 1946/01/28 Referring Provider:   Flowsheet Row Cardiac Rehab from 05/20/2023 in [facility] Cardiac and Pulmonary Rehab  Referring Provider Dr. Adrian Blackwater       Initial Encounter Date:  Flowsheet Row Cardiac Rehab from 05/20/2023 in [facility] Cardiac and Pulmonary Rehab  Date 05/20/23       Visit Diagnosis: Status post coronary artery stent placement  Patient's Home Medications on Admission:  Current Outpatient Medications:    albuterol (VENTOLIN HFA) 108 (90 Base) MCG/ACT inhaler, Inhale 2 puffs into the lungs every 6 (six) hours as needed for wheezing or shortness of breath., Disp: , Rfl:    amiodarone (PACERONE) 200 MG tablet, Take 200 mg by mouth daily., Disp: , Rfl:    amLODipine (NORVASC) 10 MG tablet, Take 1 tablet by mouth daily., Disp: , Rfl:    apixaban (ELIQUIS) 5 MG TABS tablet, Take 1 tablet (5 mg total) by mouth 2 (two) times daily., Disp: 60 tablet, Rfl: 3   atorvastatin (LIPITOR) 80 MG tablet, Take 80 mg by mouth daily., Disp: , Rfl:    buPROPion (WELLBUTRIN XL) 300 MG 24 hr tablet, Take 300 mg by mouth daily., Disp: , Rfl:    cephALEXin (KEFLEX) 500 MG capsule, Take 500 mg by mouth 3 (three) times daily. (Patient not taking: Reported on 04/26/2023), Disp: , Rfl:    clopidogrel (PLAVIX) 75 MG tablet, Take 1 tablet (75 mg total) by mouth daily with breakfast., Disp: 30 tablet, Rfl: 11   docusate (COLACE) 60 MG/15ML syrup, Take 60 mg by mouth daily., Disp: , Rfl:    EPINEPHrine 0.3 mg/0.3 mL IJ SOAJ injection, Inject 0.3 mg into the muscle as needed for anaphylaxis., Disp: , Rfl:    ezetimibe (ZETIA) 10 MG tablet, Take 10 mg by mouth daily., Disp: , Rfl:    fluticasone (FLONASE) 50 MCG/ACT nasal spray, SPRAY 2 SPRAYS INTO EACH NOSTRIL EVERY DAY, Disp: , Rfl:    gabapentin (NEURONTIN) 300 MG capsule, Take 300 mg by mouth 2 (two) times daily., Disp: , Rfl:    loratadine  (CLARITIN) 10 MG tablet, Take 10 mg by mouth daily., Disp: , Rfl:    metFORMIN (GLUCOPHAGE) 500 MG tablet, Take 1,000 mg by mouth 2 (two) times daily., Disp: , Rfl:    metoprolol succinate (TOPROL XL) 50 MG 24 hr tablet, Take 1 tablet (50 mg total) by mouth daily. Take with or immediately following a meal., Disp: 30 tablet, Rfl: 11   nitroGLYCERIN (NITROSTAT) 0.4 MG SL tablet, Place 0.4 mg under the tongue every 5 (five) minutes as needed for chest pain., Disp: , Rfl:    pantoprazole (PROTONIX) 40 MG tablet, TAKE 1 TABLET BY MOUTH EVERY DAY, Disp: 30 tablet, Rfl: 1   ranolazine (RANEXA) 500 MG 12 hr tablet, Take 1 tablet (500 mg total) by mouth 2 (two) times daily., Disp: 60 tablet, Rfl: 3   sacubitril-valsartan (ENTRESTO) 97-103 MG, TAKE 1 TABLET BY MOUTH TWICE A DAY, Disp: 180 tablet, Rfl: 1  Past Medical History: Past Medical History:  Diagnosis Date   AAA (abdominal aortic aneurysm) (HCC)    Anxiety    Aortic atherosclerosis (HCC)    Arthritis    Atrial fibrillation (HCC)    CAD (coronary artery disease)    CHF (congestive heart failure) (HCC)    Current use of long term anticoagulation    Apixaban   Depression    Diabetes mellitus without  complication (HCC)    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   Hypertension    Ischemic cardiomyopathy    MI, old    Peripheral neuropathy    PFO (patent foramen ovale) 05/2019   s/p repair   Sleep apnea    Spinal stenosis of lumbar region    TIA (transient ischemic attack)     Tobacco Use: Social History   Tobacco Use  Smoking Status Former  Smokeless Tobacco Never  Tobacco Comments   Quit over 40 years ago    Labs: Review Flowsheet  More data exists      Latest Ref Rng & Units 04/08/2020 07/04/2020 04/12/2022 04/13/2022 10/13/2022  Labs for ITP Cardiac and Pulmonary Rehab  Cholestrol 0 - 200 mg/dL - - - - 914   LDL (calc) 0 - 99 mg/dL - - - - 35   HDL-C >78 mg/dL - - - - 59   Trlycerides <150 mg/dL - - - - 55   Hemoglobin A1c 4.8 -  5.6 % 5.8  - - 5.5  6.3   Bicarbonate 20.0 - 28.0 mmol/L - - 23.9  - -  TCO2 22 - 32 mmol/L - 24  - - -  Acid-base deficit 0.0 - 2.0 mmol/L - - 0.1  - -  O2 Saturation % - - 90.3  - -     Exercise Target Goals: Exercise Program Goal: Individual exercise prescription set using results from initial 6 min walk test and THRR while considering  patient's activity barriers and safety.   Exercise Prescription Goal: Initial exercise prescription builds to 30-45 minutes a day of aerobic activity, 2-3 days per week.  Home exercise guidelines will be given to patient during program as part of exercise prescription that the participant will acknowledge.   Education: Aerobic Exercise: - Group verbal and visual presentation on the components of exercise prescription. Introduces F.I.T.T principle from ACSM for exercise prescriptions.  Reviews F.I.T.T. principles of aerobic exercise including progression. Written material given at graduation. Flowsheet Row Cardiac Rehab from 11/19/2022 in [facility] Cardiac and Pulmonary Rehab  Education need identified 11/19/22       Education: Resistance Exercise: - Group verbal and visual presentation on the components of exercise prescription. Introduces F.I.T.T principle from ACSM for exercise prescriptions  Reviews F.I.T.T. principles of resistance exercise including progression. Written material given at graduation.    Education: Exercise & Equipment Safety: - Individual verbal instruction and demonstration of equipment use and safety with use of the equipment. Flowsheet Row Cardiac Rehab from 11/19/2022 in [facility] Cardiac and Pulmonary Rehab  Date 11/19/22  Educator NT  Instruction Review Code 1- Verbalizes Understanding       Education: Exercise Physiology & General Exercise Guidelines: - Group verbal and written instruction with models to review the exercise physiology of the cardiovascular system and associated critical values. Provides general exercise  guidelines with specific guidelines to those with heart or lung disease.  Flowsheet Row Cardiac Rehab from 10/07/2019 in [facility] Cardiac and Pulmonary Rehab  Date 09/23/19  Educator AS  Instruction Review Code 1- Verbalizes Understanding       Education: Flexibility, Balance, Mind/Body Relaxation: - Group verbal and visual presentation with interactive activity on the components of exercise prescription. Introduces F.I.T.T principle from ACSM for exercise prescriptions. Reviews F.I.T.T. principles of flexibility and balance exercise training including progression. Also discusses the mind body connection.  Reviews various relaxation techniques to help reduce and manage stress (i.e. Deep breathing, progressive muscle relaxation, and visualization). Balance  handout provided to take home. Written material given at graduation.   Activity Barriers & Risk Stratification:  Activity Barriers & Cardiac Risk Stratification - 05/20/23 1536       Activity Barriers & Cardiac Risk Stratification   Activity Barriers Right Knee Replacement;Left Knee Replacement;Muscular Weakness;Balance Concerns;Other (comment)    Comments drop foot, impaired gait, neuropathy    Cardiac Risk Stratification Moderate             6 Minute Walk:  6 Minute Walk     Row Name 05/09/23 1603 05/20/23 1535       6 Minute Walk   Phase Discharge Initial    Distance 720 feet 720 feet    Distance % Change 16 % --    Distance Feet Change 100 ft 100 ft    Walk Time 6 minutes 6 minutes    # of Rest Breaks 0 0    MPH 1.36 1.36    METS 1.13 1.13    RPE 13 13    Perceived Dyspnea  1 1    VO2 Peak 3.96 3.96    Symptoms No No    Resting HR 65 bpm 65 bpm    Resting BP 110/62 --    Resting Oxygen Saturation  95 % 95 %    Exercise Oxygen Saturation  during 6 min walk 95 % 95 %    Max Ex. HR 92 bpm 92 bpm    Max Ex. BP 122/64 122/64             Oxygen Initial Assessment:   Oxygen Re-Evaluation:   Oxygen Discharge  (Final Oxygen Re-Evaluation):   Initial Exercise Prescription:  Initial Exercise Prescription - 05/20/23 1500       Date of Initial Exercise RX and Referring Provider   Date 05/20/23    Referring Provider Dr. Adrian Blackwater      Oxygen   Maintain Oxygen Saturation 88% or higher      Treadmill   MPH 1      NuStep   Level 4   T6: 3   Minutes 15    METs 2.3      T5 Nustep   Level 4    Minutes 15    METs 3      Track   Laps 15    Minutes 15    METs 1.82      Resistance Training   Training Prescription Yes    Weight 4 lb    Reps 10-15             Perform Capillary Blood Glucose checks as needed.  Exercise Prescription Changes:   Exercise Prescription Changes     Row Name 04/01/23 1600 04/16/23 1400 05/01/23 1000 05/15/23 0700       Response to Exercise   Blood Pressure (Admit) 102/56 92/58 116/60 110/62    Blood Pressure (Exit) 102/54 108/62 134/64 102/58    Heart Rate (Admit) 76 bpm 59 bpm 62 bpm 65 bpm    Heart Rate (Exercise) 87 bpm 107 bpm 118 bpm 97 bpm    Heart Rate (Exit) 74 bpm 71 bpm 88 bpm 63 bpm    Rating of Perceived Exertion (Exercise) 15 15 13 13     Symptoms none none none none    Duration Continue with 30 min of aerobic exercise without signs/symptoms of physical distress. Continue with 30 min of aerobic exercise without signs/symptoms of physical distress. Continue with 30 min of aerobic exercise without signs/symptoms of  physical distress. Continue with 30 min of aerobic exercise without signs/symptoms of physical distress.    Intensity THRR unchanged THRR unchanged THRR unchanged THRR unchanged      Progression   Progression Continue to progress workloads to maintain intensity without signs/symptoms of physical distress. Continue to progress workloads to maintain intensity without signs/symptoms of physical distress. Continue to progress workloads to maintain intensity without signs/symptoms of physical distress. Continue to progress  workloads to maintain intensity without signs/symptoms of physical distress.    Average METs 1.99 2.1 2.15 2.04      Resistance Training   Training Prescription Yes Yes Yes Yes    Weight none  Pt's doctor did not give clearance for resistance training 4 lb 4 lb 4 lb    Reps 10-15 10-15 10-15 10-15      Interval Training   Interval Training No No No No      Treadmill   MPH 1 1.5 -- --    Grade 0 0 -- --    Minutes 15 15 -- --    METs 1.77 2.15 -- --      NuStep   Level 3  T6 nustep: level 3 4  T6: level 4 3  T6 nustep 4  T6: 3    Minutes 15 15 30 15     METs 2.3  T6 nustep: 1.9 METs 2.3  T6: 1.8 METs 2.2 2.3      T5 Nustep   Level -- -- -- 4    Minutes -- -- -- 15    METs -- -- -- 3      Track   Laps -- -- -- 15    Minutes -- -- -- 15    METs -- -- -- 1.82      Home Exercise Plan   Plans to continue exercise at Home (comment)  walking Home (comment)  walking Home (comment)  walking Home (comment)  walking    Frequency Add 2 additional days to program exercise sessions. Add 2 additional days to program exercise sessions. Add 2 additional days to program exercise sessions. Add 2 additional days to program exercise sessions.    Initial Home Exercises Provided 02/11/23 02/11/23 02/11/23 02/11/23      Oxygen   Maintain Oxygen Saturation 88% or higher 88% or higher 88% or higher 88% or higher             Exercise Comments:   Exercise Comments     Row Name 05/20/23 1537           Exercise Comments First full day of exercise!  Patient was oriented to gym and equipment including functions, settings, policies, and procedures.  Patient's individual exercise prescription and treatment plan were reviewed.  All starting workloads were established based on the results of the 6 minute walk test done at initial orientation visit.  The plan for exercise progression was also introduced and progression will be customized based on patient's performance and goals.                 Exercise Goals and Review:   Exercise Goals     Row Name 05/20/23 1536             Exercise Goals   Increase Physical Activity Yes       Intervention Develop an individualized exercise prescription for aerobic and resistive training based on initial evaluation findings, risk stratification, comorbidities and participant's personal goals.;Provide advice, education, support and counseling about physical activity/exercise needs.  Expected Outcomes Long Term: Add in home exercise to make exercise part of routine and to increase amount of physical activity.;Long Term: Exercising regularly at least 3-5 days a week.;Short Term: Attend rehab on a regular basis to increase amount of physical activity.       Able to understand and use rate of perceived exertion (RPE) scale Yes       Intervention Provide education and explanation on how to use RPE scale       Expected Outcomes Short Term: Able to use RPE daily in rehab to express subjective intensity level;Long Term:  Able to use RPE to guide intensity level when exercising independently       Able to understand and use Dyspnea scale Yes       Intervention Provide education and explanation on how to use Dyspnea scale       Expected Outcomes Short Term: Able to use Dyspnea scale daily in rehab to express subjective sense of shortness of breath during exertion;Long Term: Able to use Dyspnea scale to guide intensity level when exercising independently       Knowledge and understanding of Target Heart Rate Range (THRR) Yes       Intervention Provide education and explanation of THRR including how the numbers were predicted and where they are located for reference       Expected Outcomes Short Term: Able to state/look up THRR;Short Term: Able to use daily as guideline for intensity in rehab;Long Term: Able to use THRR to govern intensity when exercising independently       Able to check pulse independently Yes       Intervention Provide education  and demonstration on how to check pulse in carotid and radial arteries.;Review the importance of being able to check your own pulse for safety during independent exercise       Expected Outcomes Short Term: Able to explain why pulse checking is important during independent exercise;Long Term: Able to check pulse independently and accurately       Understanding of Exercise Prescription Yes       Intervention Provide education, explanation, and written materials on patient's individual exercise prescription       Expected Outcomes Short Term: Able to explain program exercise prescription;Long Term: Able to explain home exercise prescription to exercise independently                Exercise Goals Re-Evaluation :  Exercise Goals Re-Evaluation     Row Name 04/01/23 1623 04/16/23 1440 05/01/23 1043 05/15/23 0803       Exercise Goal Re-Evaluation   Exercise Goals Review Increase Physical Activity;Increase Strength and Stamina;Understanding of Exercise Prescription Increase Physical Activity;Increase Strength and Stamina;Understanding of Exercise Prescription Increase Physical Activity;Increase Strength and Stamina;Understanding of Exercise Prescription Increase Physical Activity;Increase Strength and Stamina;Understanding of Exercise Prescription    Comments Devin [name] is doing well in rehab. He recently began walking the treadmill at a speed of 1 mph with no incline. He also continues to work at level 3 on both the T4 nustep and T6 nustep. We will continue to monitor his progress in the program. Devin [name] continues to do well in the program. He recently increased his treadmill speed to 1.5 mph. He also improved to level 4 on the T6 nustep and continues to work at level 4 on the T4 nustep. We will continue to monitor his progress in the program. Devin [name] has only attended one session since the last review. He continues to work on the T6  nustep at level 3 for 30 minutes. We will encourage him to attend rehab more  consistently to see results from exercise. We will continue to monitor his progress in the program. Devin [name] is doing well and is close to graduating from the program. He recently walked back up to 15 laps on the track and improved to level 4 on the T5 nustep. He also completed his post and improved by 16%. We will continue to monitor his progress until he graduates from the program.    Expected Outcomes Short: Increase to level 4 on the T4 and T6 nustep machines. Long: Continue exercise to improve strength and stamina. Short: Increase to level 5 on the T4 nustep. Long: Continue exercise to improve strength and stamina. Short: Attend rehab more consistently. Long: Continue exercise to improve strength and stamina. --             Discharge Exercise Prescription (Final Exercise Prescription Changes):  Exercise Prescription Changes - 05/15/23 0700       Response to Exercise   Blood Pressure (Admit) 110/62    Blood Pressure (Exit) 102/58    Heart Rate (Admit) 65 bpm    Heart Rate (Exercise) 97 bpm    Heart Rate (Exit) 63 bpm    Rating of Perceived Exertion (Exercise) 13    Symptoms none    Duration Continue with 30 min of aerobic exercise without signs/symptoms of physical distress.    Intensity THRR unchanged      Progression   Progression Continue to progress workloads to maintain intensity without signs/symptoms of physical distress.    Average METs 2.04      Resistance Training   Training Prescription Yes    Weight 4 lb    Reps 10-15      Interval Training   Interval Training No      NuStep   Level 4   T6: 3   Minutes 15    METs 2.3      T5 Nustep   Level 4    Minutes 15    METs 3      Track   Laps 15    Minutes 15    METs 1.82      Home Exercise Plan   Plans to continue exercise at Home (comment)   walking   Frequency Add 2 additional days to program exercise sessions.    Initial Home Exercises Provided 02/11/23      Oxygen   Maintain Oxygen Saturation 88% or  higher             Nutrition:  Target Goals: Understanding of nutrition guidelines, daily intake of sodium 1500mg , cholesterol 200mg , calories 30% from fat and 7% or less from saturated fats, daily to have 5 or more servings of fruits and vegetables.  Education: All About Nutrition: -Group instruction provided by verbal, written material, interactive activities, discussions, models, and posters to present general guidelines for heart healthy nutrition including fat, fiber, MyPlate, the role of sodium in heart healthy nutrition, utilization of the nutrition label, and utilization of this knowledge for meal planning. Follow up email sent as well. Written material given at graduation. Flowsheet Row Cardiac Rehab from 11/19/2022 in [facility] Cardiac and Pulmonary Rehab  Education need identified 11/19/22       Biometrics:   Post Biometrics - 05/09/23 1605        Post  Biometrics   Height 6' (1.829 m)    Weight 222 lb (100.7 kg)  BMI (Calculated) 30.1    Single Leg Stand 2 seconds             Nutrition Therapy Plan and Nutrition Goals:   Nutrition Assessments:  MEDIFICTS Score Key: >=70 Need to make dietary changes  40-70 Heart Healthy Diet <= 40 Therapeutic Level Cholesterol Diet  Flowsheet Row Cardiac Rehab from 05/20/2023 in [facility] Cardiac and Pulmonary Rehab  Picture Your Plate Total Score on Admission 64      Picture Your Plate Scores: <19 Unhealthy dietary pattern with much room for improvement. 41-50 Dietary pattern unlikely to meet recommendations for good health and room for improvement. 51-60 More healthful dietary pattern, with some room for improvement.  >60 Healthy dietary pattern, although there may be some specific behaviors that could be improved.    Nutrition Goals Re-Evaluation:   Nutrition Goals Discharge (Final Nutrition Goals Re-Evaluation):   Psychosocial: Target Goals: Acknowledge presence or absence of significant depression and/or stress,  maximize coping skills, provide positive support system. Participant is able to verbalize types and ability to use techniques and skills needed for reducing stress and depression.   Education: Stress, Anxiety, and Depression - Group verbal and visual presentation to define topics covered.  Reviews how body is impacted by stress, anxiety, and depression.  Also discusses healthy ways to reduce stress and to treat/manage anxiety and depression.  Written material given at graduation.   Education: Sleep Hygiene -Provides group verbal and written instruction about how sleep can affect your health.  Define sleep hygiene, discuss sleep cycles and impact of sleep habits. Review good sleep hygiene tips.    Initial Review & Psychosocial Screening:  Initial Psych Review & Screening - 05/20/23 1520       Initial Review   Current issues with None Identified      Family Dynamics   Good Support System? Yes      Barriers   Psychosocial barriers to participate in program There are no identifiable barriers or psychosocial needs.      Screening Interventions   Interventions Encouraged to exercise;To provide support and resources with identified psychosocial needs;Provide feedback about the scores to participant    Expected Outcomes Short Term goal: Utilizing psychosocial counselor, staff and physician to assist with identification of specific Stressors or current issues interfering with healing process. Setting desired goal for each stressor or current issue identified.;Long Term Goal: Stressors or current issues are controlled or eliminated.;Short Term goal: Identification and review with participant of any Quality of Life or Depression concerns found by scoring the questionnaire.;Long Term goal: The participant improves quality of Life and PHQ9 Scores as seen by post scores and/or verbalization of changes             Quality of Life Scores:   Quality of Life - 05/20/23 1523       Quality of Life    Select Quality of Life      Quality of Life Scores   Health/Function Pre 27.6 %    Socioeconomic Pre 28 %    Psych/Spiritual Pre 30 %    Family Pre 27 %    GLOBAL Pre 28.13 %            Scores of 19 and below usually indicate a poorer quality of life in these areas.  A difference of  2-3 points is a clinically meaningful difference.  A difference of 2-3 points in the total score of the Quality of Life Index has been associated with significant improvement in overall  quality of life, self-image, physical symptoms, and general health in studies assessing change in quality of life.  PHQ-9: Review Flowsheet  More data exists      05/16/2023 11/19/2022 01/12/2020 10/28/2019 09/09/2019  Depression screen PHQ 2/9  Decreased Interest 0 1 3 2 1   Down, Depressed, Hopeless 0 1 0 0 0  PHQ - 2 Score 0 2 3 2 1   Altered sleeping 0 1 1 3 3   Tired, decreased energy 3 1 2 2  0  Change in appetite 3 1 1 1  0  Feeling bad or failure about yourself  0 0 0 0 0  Trouble concentrating 0 0 0 1 0  Moving slowly or fidgety/restless 0 0 0 1 0  Suicidal thoughts 0 0 1 0 0  PHQ-9 Score 6 5 8 10 4   Difficult doing work/chores Not difficult at all Somewhat difficult Not difficult at all Somewhat difficult Not difficult at all   Interpretation of Total Score  Total Score Depression Severity:  1-4 = Minimal depression, 5-9 = Mild depression, 10-14 = Moderate depression, 15-19 = Moderately severe depression, 20-27 = Severe depression   Psychosocial Evaluation and Intervention:  Psychosocial Evaluation - 05/20/23 1520       Psychosocial Evaluation & Interventions   Interventions Encouraged to exercise with the program and follow exercise prescription    Comments Devin [name] is returning to the program after another stent placement. He is motivated to attend the program and has no barriers to attending. He has completed PT for balance and plans to increase his stamina in the program. He relies on his lady friend for  support and company    Expected Outcomes Short: attend cardiac rehab for education and exercise Long: develop and maintain positive self care habits    Continue Psychosocial Services  Follow up required by staff             Psychosocial Re-Evaluation:   Psychosocial Discharge (Final Psychosocial Re-Evaluation):   Vocational Rehabilitation: Provide vocational rehab assistance to qualifying candidates.   Vocational Rehab Evaluation & Intervention:  Vocational Rehab - 05/20/23 1519       Initial Vocational Rehab Evaluation & Intervention   Assessment shows need for Vocational Rehabilitation No             Education: Education Goals: Education classes will be provided on a variety of topics geared toward better understanding of heart health and risk factor modification. Participant will state understanding/return demonstration of topics presented as noted by education test scores.  Learning Barriers/Preferences:   General Cardiac Education Topics:  AED/CPR: - Group verbal and written instruction with the use of models to demonstrate the basic use of the AED with the basic ABC's of resuscitation.   Anatomy and Cardiac Procedures: - Group verbal and visual presentation and models provide information about basic cardiac anatomy and function. Reviews the testing methods done to diagnose heart disease and the outcomes of the test results. Describes the treatment choices: Medical Management, Angioplasty, or Coronary Bypass Surgery for treating various heart conditions including Myocardial Infarction, Angina, Valve Disease, and Cardiac Arrhythmias.  Written material given at graduation. Flowsheet Row Cardiac Rehab from 11/19/2022 in [facility] Cardiac and Pulmonary Rehab  Education need identified 11/19/22       Medication Safety: - Group verbal and visual instruction to review commonly prescribed medications for heart and lung disease. Reviews the medication, class of the drug, and  side effects. Includes the steps to properly store meds and maintain the prescription regimen.  Written material given at graduation. Flowsheet Row Cardiac Rehab from 10/07/2019 in [facility] Cardiac and Pulmonary Rehab  Date 10/07/19  Educator SB  Instruction Review Code 1- Verbalizes Understanding       Intimacy: - Group verbal instruction through game format to discuss how heart and lung disease can affect sexual intimacy. Written material given at graduation..   Know Your Numbers and Heart Failure: - Group verbal and visual instruction to discuss disease risk factors for cardiac and pulmonary disease and treatment options.  Reviews associated critical values for Overweight/Obesity, Hypertension, Cholesterol, and Diabetes.  Discusses basics of heart failure: signs/symptoms and treatments.  Introduces Heart Failure Zone chart for action plan for heart failure.  Written material given at graduation.   Infection Prevention: - Provides verbal and written material to individual with discussion of infection control including proper hand washing and proper equipment cleaning during exercise session. Flowsheet Row Cardiac Rehab from 11/19/2022 in [facility] Cardiac and Pulmonary Rehab  Date 11/19/22  Educator NT  Instruction Review Code 1- Verbalizes Understanding       Falls Prevention: - Provides verbal and written material to individual with discussion of falls prevention and safety. Flowsheet Row Cardiac Rehab from 11/19/2022 in [facility] Cardiac and Pulmonary Rehab  Date 11/19/22  Educator NT  Instruction Review Code 1- Verbalizes Understanding       Other: -Provides group and verbal instruction on various topics (see comments)   Knowledge Questionnaire Score:  Knowledge Questionnaire Score - 05/20/23 1519       Knowledge Questionnaire Score   Pre Score 24/26             Core Components/Risk Factors/Patient Goals at Admission:  Personal Goals and Risk Factors at Admission - 05/20/23  1520       Core Components/Risk Factors/Patient Goals on Admission    Weight Management Yes;Weight Maintenance    Intervention Weight Management: Develop a combined nutrition and exercise program designed to reach desired caloric intake, while maintaining appropriate intake of nutrient and fiber, sodium and fats, and appropriate energy expenditure required for the weight goal.;Weight Management: Provide education and appropriate resources to help participant work on and attain dietary goals.    Expected Outcomes Short Term: Continue to assess and modify interventions until short term weight is achieved;Long Term: Adherence to nutrition and physical activity/exercise program aimed toward attainment of established weight goal;Weight Maintenance: Understanding of the daily nutrition guidelines, which includes 25-35% calories from fat, 7% or less cal from saturated fats, less than 200mg  cholesterol, less than 1.5gm of sodium, & 5 or more servings of fruits and vegetables daily    Diabetes Yes    Intervention Provide education about signs/symptoms and action to take for hypo/hyperglycemia.;Provide education about proper nutrition, including hydration, and aerobic/resistive exercise prescription along with prescribed medications to achieve blood glucose in normal ranges: Fasting glucose 65-99 mg/dL    Expected Outcomes Short Term: Participant verbalizes understanding of the signs/symptoms and immediate care of hyper/hypoglycemia, proper foot care and importance of medication, aerobic/resistive exercise and nutrition plan for blood glucose control.;Long Term: Attainment of HbA1C < 7%.    Heart Failure Yes    Intervention Provide a combined exercise and nutrition program that is supplemented with education, support and counseling about heart failure. Directed toward relieving symptoms such as shortness of breath, decreased exercise tolerance, and extremity edema.    Expected Outcomes Improve functional capacity  of life;Short term: Attendance in program 2-3 days a week with increased exercise capacity. Reported lower sodium intake. Reported  increased fruit and vegetable intake. Reports medication compliance.;Short term: Daily weights obtained and reported for increase. Utilizing diuretic protocols set by physician.;Long term: Adoption of self-care skills and reduction of barriers for early signs and symptoms recognition and intervention leading to self-care maintenance.    Hypertension Yes    Intervention Provide education on lifestyle modifcations including regular physical activity/exercise, weight management, moderate sodium restriction and increased consumption of fresh fruit, vegetables, and low fat dairy, alcohol moderation, and smoking cessation.;Monitor prescription use compliance.    Expected Outcomes Short Term: Continued assessment and intervention until BP is < 140/102mm HG in hypertensive participants. < 130/60mm HG in hypertensive participants with diabetes, heart failure or chronic kidney disease.;Long Term: Maintenance of blood pressure at goal levels.    Lipids Yes    Intervention Provide education and support for participant on nutrition & aerobic/resistive exercise along with prescribed medications to achieve LDL 70mg , HDL >40mg .    Expected Outcomes Short Term: Participant states understanding of desired cholesterol values and is compliant with medications prescribed. Participant is following exercise prescription and nutrition guidelines.;Long Term: Cholesterol controlled with medications as prescribed, with individualized exercise RX and with personalized nutrition plan. Value goals: LDL < 70mg , HDL > 40 mg.             Education:Diabetes - Individual verbal and written instruction to review signs/symptoms of diabetes, desired ranges of glucose level fasting, after meals and with exercise. Acknowledge that pre and post exercise glucose checks will be done for 3 sessions at entry of  program. Flowsheet Row Cardiac Rehab from 01/13/2020 in [facility] Cardiac and Pulmonary Rehab  Date 01/13/20  Educator [facility]  Instruction Review Code 1- Verbalizes Understanding       Core Components/Risk Factors/Patient Goals Review:    Core Components/Risk Factors/Patient Goals at Discharge (Final Review):    ITP Comments:  ITP Comments     Row Name 03/27/23 1459 04/24/23 0800 05/16/23 1553 05/20/23 1537 05/22/23 0754   ITP Comments 30 Day review completed. Medical Director ITP review done, changes made as directed, and signed approval by Medical Director. 30 Day review completed. Medical Director ITP review done, changes made as directed, and signed approval by Medical Director. Devin [name] graduated today from  rehab with 36 sessions completed.  Details of the patient's exercise prescription and what He needs to do in order to continue the prescription and progress were discussed with patient.  Patient was given a copy of prescription and goals.  Patient verbalized understanding. Devin [name] plans to continue to exercise by attending structured exercise program. First full day of exercise!  Patient was oriented to gym and equipment including functions, settings, policies, and procedures.  Patient's individual exercise prescription and treatment plan were reviewed.  All starting workloads were established based on the results of the 6 minute walk test done at initial orientation visit.  The plan for exercise progression was also introduced and progression will be customized based on patient's performance and goals. 30 Day review completed. Medical Director ITP review done, changes made as directed, and signed approval by Medical Director. New patient new diagnosis.            Comments: initial ITP

## 2023-05-23 ENCOUNTER — Encounter: Admitting: *Deleted

## 2023-05-23 DIAGNOSIS — Z48812 Encounter for surgical aftercare following surgery on the circulatory system: Secondary | ICD-10-CM | POA: Diagnosis not present

## 2023-05-23 DIAGNOSIS — Z955 Presence of coronary angioplasty implant and graft: Secondary | ICD-10-CM

## 2023-05-23 NOTE — Progress Notes (Signed)
 Daily Session Note  Patient Details  Name: Devin [name]. MRN: [id number] Date of Birth: 1945/09/28 Referring Provider:   Flowsheet Row Cardiac Rehab from 05/20/2023 in [facility] Cardiac and Pulmonary Rehab  Referring Provider Dr. Adrian Blackwater       Encounter Date: 05/23/2023  Check In:  Session Check In - 05/23/23 1545       Check-In   Supervising physician immediately available to respond to emergencies See telemetry face sheet for immediately available ER MD    Location [facility]-Cardiac & Pulmonary Rehab    Staff Present Susann Givens RN,BSN;Joseph Hollace Kinnier;Cora Collum, RN, BSN, CCRP;Maxon Conetta BS, Exercise Physiologist    Virtual Visit No    Medication changes reported     No    Fall or balance concerns reported    No    Warm-up and Cool-down Performed on first and last piece of equipment    Resistance Training Performed Yes    VAD Patient? No    PAD/SET Patient? No      Pain Assessment   Currently in Pain? No/denies                Social History   Tobacco Use  Smoking Status Former  Smokeless Tobacco Never  Tobacco Comments   Quit over 40 years ago    Goals Met:  Independence with exercise equipment Exercise tolerated well No report of concerns or symptoms today Strength training completed today  Goals Unmet:  Not Applicable  Comments: Pt able to follow exercise prescription today without complaint.  Will continue to monitor for progression.    Dr. Bethann Punches is Medical Director for [facility] Cardiac Rehabilitation.  Dr. Vida Rigger is Medical Director for [facility] Pulmonary Rehabilitation.

## 2023-05-27 ENCOUNTER — Encounter: Admitting: *Deleted

## 2023-05-27 DIAGNOSIS — Z48812 Encounter for surgical aftercare following surgery on the circulatory system: Secondary | ICD-10-CM | POA: Diagnosis not present

## 2023-05-27 DIAGNOSIS — Z955 Presence of coronary angioplasty implant and graft: Secondary | ICD-10-CM

## 2023-05-27 NOTE — Progress Notes (Signed)
 Daily Session Note  Patient Details  Name: Devin [name] [facility]. MRN: [id number] Date of Birth: 1945/03/14 Referring Provider:   Flowsheet Row Cardiac Rehab from 05/20/2023 in [facility] Cardiac and Pulmonary Rehab  Referring Provider Dr. Adrian Blackwater       Encounter Date: 05/27/2023  Check In:  Session Check In - 05/27/23 1614       Check-In   Supervising physician immediately available to respond to emergencies See telemetry face sheet for immediately available ER MD    Location [facility]-Cardiac & Pulmonary Rehab    Staff Present Susann Givens RN,BSN;Joseph Casimer Lanius, [place], RRT, CPFT    Virtual Visit No    Medication changes reported     No    Fall or balance concerns reported    No    Warm-up and Cool-down Performed on first and last piece of equipment    Resistance Training Performed Yes    VAD Patient? No    PAD/SET Patient? No      Pain Assessment   Currently in Pain? No/denies                Social History   Tobacco Use  Smoking Status Former  Smokeless Tobacco Never  Tobacco Comments   Quit over 40 years ago    Goals Met:  Independence with exercise equipment Exercise tolerated well No report of concerns or symptoms today Strength training completed today  Goals Unmet:  Not Applicable  Comments: Pt able to follow exercise prescription today without complaint.  Will continue to monitor for progression.    Dr. Bethann Punches is Medical Director for [facility] Cardiac Rehabilitation.  Dr. Vida Rigger is Medical Director for [facility] Pulmonary Rehabilitation.

## 2023-05-30 ENCOUNTER — Encounter: Admitting: *Deleted

## 2023-05-30 DIAGNOSIS — Z955 Presence of coronary angioplasty implant and graft: Secondary | ICD-10-CM

## 2023-05-30 DIAGNOSIS — Z48812 Encounter for surgical aftercare following surgery on the circulatory system: Secondary | ICD-10-CM | POA: Diagnosis not present

## 2023-05-30 LAB — GLUCOSE, CAPILLARY: Glucose-Capillary: 114 mg/dL — ABNORMAL HIGH (ref 70–99)

## 2023-05-30 NOTE — Progress Notes (Signed)
 Daily Session Note  Patient Details  Name: Lane Eland [facility] MRN: [id number] Date of Birth: Oct 16, 1945 Referring Provider:   Flowsheet Row Cardiac Rehab from 05/20/2023 in [facility] Cardiac and Pulmonary Rehab  Referring Provider Dr. Adrian Blackwater       Encounter Date: 05/30/2023  Check In:  Session Check In - 05/30/23 1529       Check-In   Supervising physician immediately available to respond to emergencies See telemetry face sheet for immediately available ER MD    Location [facility]-Cardiac & Pulmonary Rehab    Staff Present Susann Givens RN,BSN;Joseph [facility] BS, Exercise Physiologist;Susanne Bice, RN, BSN, CCRP    Virtual Visit No    Medication changes reported     No    Fall or balance concerns reported    No    Warm-up and Cool-down Performed on first and last piece of equipment    Resistance Training Performed Yes    VAD Patient? No    PAD/SET Patient? No      Pain Assessment   Currently in Pain? No/denies                Social History   Tobacco Use  Smoking Status Former  Smokeless Tobacco Never  Tobacco Comments   Quit over 40 years ago    Goals Met:  Independence with exercise equipment Exercise tolerated well No report of concerns or symptoms today Strength training completed today  Goals Unmet:  Not Applicable  Comments: Pt able to follow exercise prescription today without complaint.  Will continue to monitor for progression.    Dr. Bethann Punches is Medical Director for [facility] Cardiac Rehabilitation.  Dr. Vida Rigger is Medical Director for [facility] Pulmonary Rehabilitation.

## 2023-06-03 ENCOUNTER — Encounter: Admitting: *Deleted

## 2023-06-03 DIAGNOSIS — Z955 Presence of coronary angioplasty implant and graft: Secondary | ICD-10-CM

## 2023-06-03 DIAGNOSIS — Z48812 Encounter for surgical aftercare following surgery on the circulatory system: Secondary | ICD-10-CM | POA: Diagnosis not present

## 2023-06-03 NOTE — Progress Notes (Signed)
 Daily Session Note  Patient Details  Name: Devin [name]. MRN: [id number] Date of Birth: 1945/05/24 Referring Provider:   Flowsheet Row Cardiac Rehab from 05/20/2023 in [facility] Cardiac and Pulmonary Rehab  Referring Provider Dr. Adrian Blackwater       Encounter Date: 06/03/2023  Check In:  Session Check In - 06/03/23 1544       Check-In   Supervising physician immediately available to respond to emergencies See telemetry face sheet for immediately available ER MD    Location [facility]-Cardiac & Pulmonary Rehab    Staff Present Susann Givens RN,BSN;Joseph [facility] BS, Exercise Physiologist    Virtual Visit No    Medication changes reported     No    Fall or balance concerns reported    No    Warm-up and Cool-down Performed on first and last piece of equipment    Resistance Training Performed Yes    VAD Patient? No    PAD/SET Patient? No      Pain Assessment   Currently in Pain? No/denies                Social History   Tobacco Use  Smoking Status Former  Smokeless Tobacco Never  Tobacco Comments   Quit over 40 years ago    Goals Met:  Independence with exercise equipment Exercise tolerated well No report of concerns or symptoms today Strength training completed today  Goals Unmet:  Not Applicable  Comments: Pt able to follow exercise prescription today without complaint.  Will continue to monitor for progression.    Dr. Bethann Punches is Medical Director for [facility] Cardiac Rehabilitation.  Dr. Vida Rigger is Medical Director for [facility] Pulmonary Rehabilitation.

## 2023-06-06 ENCOUNTER — Encounter: Admitting: *Deleted

## 2023-06-06 DIAGNOSIS — Z955 Presence of coronary angioplasty implant and graft: Secondary | ICD-10-CM

## 2023-06-06 DIAGNOSIS — Z48812 Encounter for surgical aftercare following surgery on the circulatory system: Secondary | ICD-10-CM | POA: Diagnosis not present

## 2023-06-06 NOTE — Progress Notes (Signed)
 Daily Session Note  Patient Details  Name: Devin [name]. MRN: [id number] Date of Birth: 04/18/1945 Referring Provider:   Flowsheet Row Cardiac Rehab from 05/20/2023 in [facility] Cardiac and Pulmonary Rehab  Referring Provider Dr. Adrian Blackwater       Encounter Date: 06/06/2023  Check In:  Session Check In - 06/06/23 1521       Check-In   Supervising physician immediately available to respond to emergencies See telemetry face sheet for immediately available ER MD    Location [facility]-Cardiac & Pulmonary Rehab    Staff Present Susann Givens RN,BSN;Joseph [facility] BS, Exercise Physiologist;Noah Tickle, BS, Exercise Physiologist    Virtual Visit No    Medication changes reported     No    Fall or balance concerns reported    No    Warm-up and Cool-down Performed on first and last piece of equipment    Resistance Training Performed Yes    VAD Patient? No    PAD/SET Patient? No      Pain Assessment   Currently in Pain? No/denies                Social History   Tobacco Use  Smoking Status Former  Smokeless Tobacco Never  Tobacco Comments   Quit over 40 years ago    Goals Met:  Independence with exercise equipment Exercise tolerated well No report of concerns or symptoms today Strength training completed today  Goals Unmet:  Not Applicable  Comments: Pt able to follow exercise prescription today without complaint.  Will continue to monitor for progression.    Dr. Bethann Punches is Medical Director for [facility] Cardiac Rehabilitation.  Dr. Vida Rigger is Medical Director for [facility] Pulmonary Rehabilitation.

## 2023-06-07 ENCOUNTER — Ambulatory Visit: Admitting: Cardiovascular Disease

## 2023-06-07 ENCOUNTER — Encounter: Payer: Self-pay | Admitting: Cardiovascular Disease

## 2023-06-07 ENCOUNTER — Ambulatory Visit: Payer: Medicare HMO | Admitting: Cardiovascular Disease

## 2023-06-07 VITALS — BP 141/82 | HR 70 | Ht 72.0 in | Wt 225.0 lb

## 2023-06-07 DIAGNOSIS — R0789 Other chest pain: Secondary | ICD-10-CM | POA: Diagnosis not present

## 2023-06-07 DIAGNOSIS — I2510 Atherosclerotic heart disease of native coronary artery without angina pectoris: Secondary | ICD-10-CM

## 2023-06-07 DIAGNOSIS — I5020 Unspecified systolic (congestive) heart failure: Secondary | ICD-10-CM

## 2023-06-07 DIAGNOSIS — I25700 Atherosclerosis of coronary artery bypass graft(s), unspecified, with unstable angina pectoris: Secondary | ICD-10-CM

## 2023-06-07 DIAGNOSIS — I10 Essential (primary) hypertension: Secondary | ICD-10-CM

## 2023-06-07 DIAGNOSIS — R0602 Shortness of breath: Secondary | ICD-10-CM

## 2023-06-07 DIAGNOSIS — I4891 Unspecified atrial fibrillation: Secondary | ICD-10-CM

## 2023-06-07 DIAGNOSIS — I7143 Infrarenal abdominal aortic aneurysm, without rupture: Secondary | ICD-10-CM

## 2023-06-07 NOTE — Progress Notes (Signed)
 Cardiology Office Note   Date:  06/07/2023   ID:  Devin Volden., DOB 27-Sep-1945, MRN [id number]  PCP:  Dione Housekeeper, MD  Cardiologist:  Adrian Blackwater, MD      History of Present Illness: Devin Matsuo. is a 78 y.o. male who presents for  Chief Complaint  Patient presents with   Follow-up    6 week follow up    Has no energy, in afternoons, goes to wellness center.      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   Hypertension    Ischemic cardiomyopathy    MI, old    Peripheral neuropathy    PFO (patent foramen ovale) 05/2019   s/p repair   Sleep apnea    Spinal stenosis of lumbar region    TIA (transient ischemic attack)      Past Surgical History:  Procedure Laterality Date   APPENDECTOMY     BREAST SURGERY Right    benign mass   CARDIOVERSION Right 12/15/2019   Procedure: CARDIOVERSION;  Surgeon: Laurier Nancy, MD;  Location: [facility];  Service: Cardiovascular;  Laterality: Right;   CATARACT EXTRACTION, BILATERAL     COLONOSCOPY     CORONARY ANGIOPLASTY WITH STENT PLACEMENT     CORONARY ARTERY BYPASS GRAFT  05/2019   LIMA-LAD   CORONARY STENT INTERVENTION N/A 10/15/2022   Procedure: CORONARY STENT INTERVENTION;  Surgeon: Marcina Millard, MD;  Location: [facility];  Service: Cardiovascular;  Laterality: N/A;   LEFT HEART CATH N/A 10/15/2022   Procedure: Left Heart Cath;  Surgeon: Marcina Millard, MD;  Location: [facility];  Service: Cardiovascular;  Laterality: N/A;   LEFT HEART CATH AND CORS/GRAFTS ANGIOGRAPHY N/A 05/21/2019   Procedure: LEFT HEART  CATH AND CORONARY ANGIOGRAPHY;  Surgeon: Laurier Nancy, MD;  Location: [facility];  Service: Cardiovascular;  Laterality: N/A;   LUMBAR LAMINECTOMY/DECOMPRESSION MICRODISCECTOMY N/A 07/04/2020   Procedure: L4-5 LAMINECTOMY;  Surgeon: Lucy Chris, MD;  Location: [facility];  Service: Neurosurgery;  Laterality: N/A;   REPLACEMENT TOTAL KNEE BILATERAL       Current Outpatient Medications  Medication Sig Dispense Refill   albuterol (VENTOLIN HFA) 108 (90 Base) MCG/ACT inhaler Inhale 2 puffs into the lungs every 6 (six) hours as needed for wheezing or shortness of breath.     amiodarone (PACERONE) 200 MG tablet Take 200 mg by mouth daily.     apixaban (ELIQUIS) 5 MG TABS tablet Take 1 tablet (5 mg total) by mouth 2 (two) times daily. 60 tablet 3   atorvastatin (LIPITOR) 80 MG tablet Take 80 mg by mouth daily.     buPROPion (WELLBUTRIN XL) 300 MG 24 hr tablet Take 300 mg by mouth daily.     cephALEXin (KEFLEX) 500 MG capsule Take 500 mg by mouth 3 (three) times daily. (Patient not taking: Reported on 04/26/2023)     clopidogrel (PLAVIX) 75 MG tablet Take 1 tablet (75 mg total) by mouth daily with breakfast. 30 tablet 11   docusate (COLACE)  60 MG/15ML syrup Take 60 mg by mouth daily.     EPINEPHrine 0.3 mg/0.3 mL IJ SOAJ injection Inject 0.3 mg into the muscle as needed for anaphylaxis.     ezetimibe (ZETIA) 10 MG tablet Take 10 mg by mouth daily.     fluticasone (FLONASE) 50 MCG/ACT nasal spray SPRAY 2 SPRAYS INTO EACH NOSTRIL EVERY DAY     gabapentin (NEURONTIN) 300 MG capsule Take 300 mg by mouth 2 (two) times daily.     loratadine (CLARITIN) 10 MG tablet Take 10 mg by mouth daily.     metFORMIN (GLUCOPHAGE) 500 MG tablet Take 1,000 mg by mouth 2 (two) times daily.     metoprolol succinate (TOPROL XL) 50 MG 24 hr tablet Take 1 tablet (50 mg total) by mouth daily. Take with or immediately following a meal. 30 tablet 11   nitroGLYCERIN (NITROSTAT) 0.4 MG SL tablet Place 0.4 mg under the  tongue every 5 (five) minutes as needed for chest pain.     pantoprazole (PROTONIX) 40 MG tablet TAKE 1 TABLET BY MOUTH EVERY DAY 30 tablet 1   ranolazine (RANEXA) 500 MG 12 hr tablet Take 1 tablet (500 mg total) by mouth 2 (two) times daily. 60 tablet 3   sacubitril-valsartan (ENTRESTO) 97-103 MG TAKE 1 TABLET BY MOUTH TWICE A DAY 180 tablet 1   No current facility-administered medications for this visit.    Allergies:   Morphine, Yellow jacket venom [bee venom], and Trazodone    Social History:   reports that he has quit smoking. He has never used smokeless tobacco. He reports that he does not currently use alcohol. He reports that he does not use drugs.   Family History:  family history includes Osteoarthritis in his mother; Other (age of onset: 31) in his father.    ROS:     Review of Systems  Constitutional: Negative.   HENT: Negative.    Eyes: Negative.   Respiratory: Negative.    Gastrointestinal: Negative.   Genitourinary: Negative.   Musculoskeletal: Negative.   Skin: Negative.   Neurological: Negative.   Endo/Heme/Allergies: Negative.   Psychiatric/Behavioral: Negative.    All other systems reviewed and are negative.     All other systems are reviewed and negative.    PHYSICAL EXAM: VS:  BP (!) 141/82   Pulse 70   Ht 6' (1.829 m)   Wt 225 lb (102.1 kg)   SpO2 96%   BMI 30.52 kg/m  , BMI Body mass index is 30.52 kg/m. Last weight:  Wt Readings from Last 3 Encounters:  06/07/23 225 lb (102.1 kg)  05/09/23 222 lb (100.7 kg)  04/26/23 213 lb (96.6 kg)     Physical Exam Vitals reviewed.  Constitutional:      Appearance: Normal appearance. He is normal weight.  HENT:     Head: Normocephalic.     Nose: Nose normal.     Mouth/Throat:     Mouth: Mucous membranes are moist.  Eyes:     Pupils: Pupils are equal, round, and reactive to light.  Cardiovascular:     Rate and Rhythm: Normal rate and regular rhythm.     Pulses: Normal pulses.     Heart  sounds: Normal heart sounds.  Pulmonary:     Effort: Pulmonary effort is normal.  Abdominal:     General: Abdomen is flat. Bowel sounds are normal.  Musculoskeletal:        General: Normal range of motion.     Cervical back:  Normal range of motion.  Skin:    General: Skin is warm.  Neurological:     General: No focal deficit present.     Mental Status: He is alert.  Psychiatric:        Mood and Affect: Mood normal.       EKG:   Recent Labs: 10/16/2022: BUN 10; Creatinine, Ser 0.89; Hemoglobin 13.8; Magnesium 2.3; Platelets 336; Potassium 4.1; Sodium 139    Lipid Panel    Component Value Date/Time   CHOL 105 10/13/2022 0313   TRIG 55 10/13/2022 0313   HDL 59 10/13/2022 0313   CHOLHDL 1.8 10/13/2022 0313   VLDL 11 10/13/2022 0313   LDLCALC 35 10/13/2022 0313      Other studies Reviewed: Additional studies/ records that were reviewed today include:  Review of the above records demonstrates:       No data to display            ASSESSMENT AND PLAN:    ICD-10-CM   1. Primary hypertension  I10    bp at cardiac reahb drops to 98/60, will stop amlodapine    2. Other chest pain  R07.89    no chest pain, so may restart ranoazine    3. Coronary artery disease involving native coronary artery of native heart without angina pectoris  I25.10     4. HFrEF (heart failure with reduced ejection fraction) (HCC)  I50.20     5. SOB (shortness of breath)  R06.02     6. Coronary artery disease involving coronary bypass graft with unstable angina pectoris, unspecified whether native or transplanted heart (HCC)  I25.700     7. Atrial fibrillation status post cardioversion (HCC)  I48.91    in nsr    8. Infrarenal abdominal aortic aneurysm (AAA) without rupture (HCC)  I71.43        Problem List Items Addressed This Visit       Cardiovascular and Mediastinum   CAD (coronary artery disease)   HTN (hypertension) - Primary   AAA (abdominal aortic aneurysm) (HCC)    Atrial fibrillation status post cardioversion (HCC)   HFrEF (heart failure with reduced ejection fraction) (HCC)     Other   Chest pain   Other Visit Diagnoses       SOB (shortness of breath)              Disposition:   Return in about 4 weeks (around 07/05/2023).    Total time spent: 30 minutes  Signed,  Adrian Blackwater, MD  06/07/2023 11:53 AM    [facility]

## 2023-06-10 ENCOUNTER — Encounter

## 2023-06-13 ENCOUNTER — Encounter: Attending: Cardiovascular Disease | Admitting: *Deleted

## 2023-06-13 DIAGNOSIS — Z955 Presence of coronary angioplasty implant and graft: Secondary | ICD-10-CM | POA: Diagnosis present

## 2023-06-13 DIAGNOSIS — I214 Non-ST elevation (NSTEMI) myocardial infarction: Secondary | ICD-10-CM | POA: Insufficient documentation

## 2023-06-13 NOTE — Progress Notes (Signed)
 Daily Session Note  Patient Details  Name: Devin [name]. MRN: [id number] Date of Birth: [birth year] Referring Provider:   Flowsheet Row Cardiac Rehab from 05/20/2023 in [facility] Cardiac [facility] Pul[facility] Rehab  Referring Provider Dr. Adrian Blackwater       Encounter Date: 06/13/2023  Check In:  Session Check In - 06/13/23 1530       Check-In   Supervising physician immediately available to respond to emergencies See telemetry face sheet for immediately available ER MD    Location [facility]-Cardiac & Pulmonary Re[facility]    Staff Present Elige Ko RCP,RRT,BSRT;Franceen Erisman Jewel Baize RN,BSN;Noah Tickle, BS, Exercise Physiologist    Virtual Visit No    Medication changes reported     No    Fall or balance concerns reported    No    Warm-up and Cool-down Performed on first and last piece of equipment    Resistance Training Performed Yes    VAD Patient? No    PAD/SET Patient? No      Pain Assessment   Currently in Pain? No/denies                Social History   Tobacco Use  Smoking Status Former  Smokeless Tobacco Never  Tobacco Comments   Quit over 40 years ago    Goals Met:  Independence with exercise equipment Exercise tolerated well No report of concerns or symptoms today Strength training completed today  Goals Unmet:  Not Applicable  Comments: Pt able to follow exercise prescription today without complaint.  Will continue to monitor for progression.    Dr. Bethann Punches is Medical Director for [facility] Cardiac Rehabilitation.  Dr. Vida Rigger is Medical Director for [facility] Pulmonary Rehabilitation.

## 2023-06-17 ENCOUNTER — Encounter: Admitting: *Deleted

## 2023-06-17 DIAGNOSIS — Z955 Presence of coronary angioplasty implant and graft: Secondary | ICD-10-CM

## 2023-06-17 NOTE — Progress Notes (Signed)
 Daily Session Note  Patient Details  Name: Devin [name]. MRN: [id number] Date of Birth: May 03, 1945 Referring Provider:   Flowsheet Row Cardiac Rehab from 05/20/2023 in [facility] Cardiac and Pulmonary Rehab  Referring Provider Dr. Adrian Blackwater       Encounter Date: 06/17/2023  Check In:  Session Check In - 06/17/23 1548       Check-In   Supervising physician immediately available to respond to emergencies See telemetry face sheet for immediately available ER MD    Location [facility]    Staff Present Susann Givens RN,BSN;Susanne Bice, RN, BSN, CCRP;Margaret Best, MS, Exercise Physiologist;Maxon Conetta BS, Exercise Physiologist    Virtual Visit No    Medication changes reported     No    Fall or balance concerns reported    No    Warm-up and Cool-down Performed on first and last piece of equipment    Resistance Training Performed Yes    VAD Patient? No    PAD/SET Patient? No      Pain Assessment   Currently in Pain? No/denies                Social History   Tobacco Use  Smoking Status Former  Smokeless Tobacco Never  Tobacco Comments   Quit over 40 years ago    Goals Met:  Independence with exercise equipment Exercise tolerated well No report of concerns or symptoms today Strength training completed today  Goals Unmet:  Not Applicable  Comments: Pt able to follow exercise prescription today without complaint.  Will continue to monitor for progression.    Dr. Bethann Punches is Medical Director for [facility] Cardiac Rehabilitation.  Dr. Vida Rigger is Medical Director for [facility] Pulmonary Rehabilitation.

## 2023-06-19 ENCOUNTER — Encounter: Payer: Self-pay | Admitting: *Deleted

## 2023-06-19 DIAGNOSIS — Z955 Presence of coronary angioplasty implant and graft: Secondary | ICD-10-CM

## 2023-06-19 NOTE — Progress Notes (Signed)
 Cardiac Individual Treatment Plan  Patient Details  Name: Devin [name] [facility]. MRN: [id number] Date of Birth: 10-Jul-1945 Referring Provider:   Flowsheet Row Cardiac Rehab from 05/20/2023 in [facility] Cardiac and Pulmonary Rehab  Referring Provider Dr. Adrian Blackwater       Initial Encounter Date:  Flowsheet Row Cardiac Rehab from 05/20/2023 in [facility] Cardiac and Pulmonary Rehab  Date 05/20/23       Visit Diagnosis: Status post coronary artery stent placement  Patient's Home Medications on Admission:  Current Outpatient Medications:    albuterol (VENTOLIN HFA) 108 (90 Base) MCG/ACT inhaler, Inhale 2 puffs into the lungs every 6 (six) hours as needed for wheezing or shortness of breath., Disp: , Rfl:    amiodarone (PACERONE) 200 MG tablet, Take 200 mg by mouth daily., Disp: , Rfl:    apixaban (ELIQUIS) 5 MG TABS tablet, Take 1 tablet (5 mg total) by mouth 2 (two) times daily., Disp: 60 tablet, Rfl: 3   atorvastatin (LIPITOR) 80 MG tablet, Take 80 mg by mouth daily., Disp: , Rfl:    buPROPion (WELLBUTRIN XL) 300 MG 24 hr tablet, Take 300 mg by mouth daily., Disp: , Rfl:    cephALEXin (KEFLEX) 500 MG capsule, Take 500 mg by mouth 3 (three) times daily. (Patient not taking: Reported on 04/26/2023), Disp: , Rfl:    clopidogrel (PLAVIX) 75 MG tablet, Take 1 tablet (75 mg total) by mouth daily with breakfast., Disp: 30 tablet, Rfl: 11   docusate (COLACE) 60 MG/15ML syrup, Take 60 mg by mouth daily., Disp: , Rfl:    EPINEPHrine 0.3 mg/0.3 mL IJ SOAJ injection, Inject 0.3 mg into the muscle as needed for anaphylaxis., Disp: , Rfl:    ezetimibe (ZETIA) 10 MG tablet, Take 10 mg by mouth daily., Disp: , Rfl:    fluticasone (FLONASE) 50 MCG/ACT nasal spray, SPRAY 2 SPRAYS INTO EACH NOSTRIL EVERY DAY, Disp: , Rfl:    gabapentin (NEURONTIN) 300 MG capsule, Take 300 mg by mouth 2 (two) times daily., Disp: , Rfl:    loratadine (CLARITIN) 10 MG tablet, Take 10 mg by mouth daily., Disp: , Rfl:    metFORMIN  (GLUCOPHAGE) 500 MG tablet, Take 1,000 mg by mouth 2 (two) times daily., Disp: , Rfl:    metoprolol succinate (TOPROL XL) 50 MG 24 hr tablet, Take 1 tablet (50 mg total) by mouth daily. Take with or immediately following a meal., Disp: 30 tablet, Rfl: 11   nitroGLYCERIN (NITROSTAT) 0.4 MG SL tablet, Place 0.4 mg under the tongue every 5 (five) minutes as needed for chest pain., Disp: , Rfl:    pantoprazole (PROTONIX) 40 MG tablet, TAKE 1 TABLET BY MOUTH EVERY DAY, Disp: 30 tablet, Rfl: 1   ranolazine (RANEXA) 500 MG 12 hr tablet, Take 1 tablet (500 mg total) by mouth 2 (two) times daily., Disp: 60 tablet, Rfl: 3   sacubitril-valsartan (ENTRESTO) 97-103 MG, TAKE 1 TABLET BY MOUTH TWICE A DAY, Disp: 180 tablet, Rfl: 1  Past Medical History: 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   Hypertension  Ischemic cardiomyopathy    MI, old    Peripheral neuropathy    PFO (patent foramen ovale) 05/2019   s/p repair   Sleep apnea    Spinal stenosis of lumbar region    TIA (transient ischemic attack)     Tobacco Use: Social History   Tobacco Use  Smoking Status Former  Smokeless Tobacco Never  Tobacco Comments   Quit over 40 years ago    Labs: Review Flowsheet  More data exists      Latest Ref Rng & Units 04/08/2020 07/04/2020 04/12/2022 04/13/2022 10/13/2022  Labs for ITP Cardiac and Pulmonary Rehab  Cholestrol 0 - 200 mg/dL - - - - 811   LDL (calc) 0 - 99 mg/dL - - - - 35   HDL-C >91 mg/dL - - - - 59   Trlycerides <150 mg/dL - - - - 55   Hemoglobin A1c 4.8 - 5.6 % 5.8  - - 5.5  6.3   Bicarbonate 20.0 - 28.0 mmol/L - - 23.9  - -  TCO2 22  - 32 mmol/L - 24  - - -  Acid-base deficit 0.0 - 2.0 mmol/L - - 0.1  - -  O2 Saturation % - - 90.3  - -     Exercise Target Goals: Exercise Program Goal: Individual exercise prescription set using results from initial 6 min walk test and THRR while considering  patient's activity barriers and safety.   Exercise Prescription Goal: Initial exercise prescription builds to 30-45 minutes a day of aerobic activity, 2-3 days per week.  Home exercise guidelines will be given to patient during program as part of exercise prescription that the participant will acknowledge.   Education: Aerobic Exercise: - Group verbal and visual presentation on the components of exercise prescription. Introduces F.I.T.T principle from ACSM for exercise prescriptions.  Reviews F.I.T.T. principles of aerobic exercise including progression. Written material given at graduation. Flowsheet Row Cardiac Rehab from 11/19/2022 in [facility] Cardiac and Pulmonary Rehab  Education need identified 11/19/22       Education: Resistance Exercise: - Group verbal and visual presentation on the components of exercise prescription. Introduces F.I.T.T principle from ACSM for exercise prescriptions  Reviews F.I.T.T. principles of resistance exercise including progression. Written material given at graduation.    Education: Exercise & Equipment Safety: - Individual verbal instruction and demonstration of equipment use and safety with use of the equipment. Flowsheet Row Cardiac Rehab from 11/19/2022 in [facility] Cardiac and Pulmonary Rehab  Date 11/19/22  Educator NT  Instruction Review Code 1- Verbalizes Understanding       Education: Exercise Physiology & General Exercise Guidelines: - Group verbal and written instruction with models to review the exercise physiology of the cardiovascular system and associated critical values. Provides general exercise guidelines with specific guidelines to those with heart or lung disease.  Flowsheet Row  Cardiac Rehab from 10/07/2019 in [facility] Cardiac and Pulmonary Rehab  Date 09/23/19  Educator AS  Instruction Review Code 1- Verbalizes Understanding       Education: Flexibility, Balance, Mind/Body Relaxation: - Group verbal and visual presentation with interactive activity on the components of exercise prescription. Introduces F.I.T.T principle from ACSM for exercise prescriptions. Reviews F.I.T.T. principles of flexibility and balance exercise training including progression. Also discusses the mind body connection.  Reviews various relaxation techniques to help reduce and manage stress (i.e. Deep breathing, progressive muscle relaxation, and visualization). Balance handout provided to take home. Written material given at graduation.   Activity Barriers & Risk Stratification:  Activity Barriers & Cardiac Risk Stratification - 05/20/23 1536       Activity Barriers & Cardiac Risk Stratification   Activity Barriers Right Knee Replacement;Left Knee Replacement;Muscular Weakness;Balance Concerns;Other (comment)    Comments drop foot, impaired gait, neuropathy    Cardiac Risk Stratification Moderate             6 Minute Walk:  6 Minute Walk     Row Name 05/09/23 1603 05/20/23 1535       6 Minute Walk   Phase Discharge Initial    Distance 720 feet 720 feet    Distance % Change 16 % --    Distance Feet Change 100 ft 100 ft    Walk Time 6 minutes 6 minutes    # of Rest Breaks 0 0    MPH 1.36 1.36    METS 1.13 1.13    RPE 13 13    Perceived Dyspnea  1 1    VO2 Peak 3.96 3.96    Symptoms No No    Resting HR 65 bpm 65 bpm    Resting BP 110/62 --    Resting Oxygen Saturation  95 % 95 %    Exercise Oxygen Saturation  during 6 min walk 95 % 95 %    Max Ex. HR 92 bpm 92 bpm    Max Ex. BP 122/64 122/64             Oxygen Initial Assessment:   Oxygen Re-Evaluation:   Oxygen Discharge (Final Oxygen Re-Evaluation):   Initial Exercise Prescription:  Initial Exercise  Prescription - 05/20/23 1500       Date of Initial Exercise RX and Referring Provider   Date 05/20/23    Referring Provider Dr. Adrian Blackwater      Oxygen   Maintain Oxygen Saturation 88% or higher      Treadmill   MPH 1      NuStep   Level 4   T6: 3   Minutes 15    METs 2.3      T5 Nustep   Level 4    Minutes 15    METs 3      Track   Laps 15    Minutes 15    METs 1.82      Resistance Training   Training Prescription Yes    Weight 4 lb    Reps 10-15             Perform Capillary Blood Glucose checks as needed.  Exercise Prescription Changes:   Exercise Prescription Changes     Row Name 05/15/23 0700 05/28/23 1500 06/12/23 1500         Response to Exercise   Blood Pressure (Admit) 110/62 100/62 112/70     Blood Pressure (Exercise) -- -- 118/60     Blood Pressure (Exit) 102/58 112/64 98/54     Heart Rate (Admit) 65 bpm 65 bpm 78 bpm     Heart Rate (Exercise) 97 bpm 90 bpm 96 bpm     Heart Rate (Exit) 63 bpm 67 bpm 70 bpm     Rating of Perceived Exertion (Exercise) 13 13 15      Symptoms none none none     Comments -- First two days back in rehab --     Duration Continue with 30 min of aerobic exercise without signs/symptoms of physical distress. Continue with 30 min of aerobic exercise without signs/symptoms of physical distress. Continue with 30 min of aerobic exercise without  signs/symptoms of physical distress.     Intensity THRR unchanged THRR unchanged THRR unchanged       Progression   Progression Continue to progress workloads to maintain intensity without signs/symptoms of physical distress. Continue to progress workloads to maintain intensity without signs/symptoms of physical distress. Continue to progress workloads to maintain intensity without signs/symptoms of physical distress.     Average METs 2.04 2.27 2.09       Resistance Training   Training Prescription Yes Yes Yes     Weight 4 lb 3 lb 3 lb     Reps 10-15 10-15 10-15       Interval  Training   Interval Training No No No       NuStep   Level 4  T6: 3 5 5   T6: 3     Minutes 15 15 15      METs 2.3 2.4 3  T6: 2       T5 Nustep   Level 4 -- --     Minutes 15 -- --     METs 3 -- --       Track   Laps 15 15 25      Minutes 15 15 15      METs 1.82 1.82 2.36       Home Exercise Plan   Plans to continue exercise at Home (comment)  walking -- --     Frequency Add 2 additional days to program exercise sessions. -- --     Initial Home Exercises Provided 02/11/23 -- --       Oxygen   Maintain Oxygen Saturation 88% or higher 88% or higher 88% or higher              Exercise Comments:   Exercise Comments     Row Name 05/20/23 1537           Exercise Comments First full day of exercise!  Patient was oriented to gym and equipment including functions, settings, policies, and procedures.  Patient's individual exercise prescription and treatment plan were reviewed.  All starting workloads were established based on the results of the 6 minute walk test done at initial orientation visit.  The plan for exercise progression was also introduced and progression will be customized based on patient's performance and goals.                Exercise Goals and Review:   Exercise Goals     Row Name 05/20/23 1536             Exercise Goals   Increase Physical Activity Yes       Intervention Develop an individualized exercise prescription for aerobic and resistive training based on initial evaluation findings, risk stratification, comorbidities and participant's personal goals.;Provide advice, education, support and counseling about physical activity/exercise needs.       Expected Outcomes Long Term: Add in home exercise to make exercise part of routine and to increase amount of physical activity.;Long Term: Exercising regularly at least 3-5 days a week.;Short Term: Attend rehab on a regular basis to increase amount of physical activity.       Able to understand and use  rate of perceived exertion (RPE) scale Yes       Intervention Provide education and explanation on how to use RPE scale       Expected Outcomes Short Term: Able to use RPE daily in rehab to express subjective intensity level;Long Term:  Able to use RPE to guide intensity  level when exercising independently       Able to understand and use Dyspnea scale Yes       Intervention Provide education and explanation on how to use Dyspnea scale       Expected Outcomes Short Term: Able to use Dyspnea scale daily in rehab to express subjective sense of shortness of breath during exertion;Long Term: Able to use Dyspnea scale to guide intensity level when exercising independently       Knowledge and understanding of Target Heart Rate Range (THRR) Yes       Intervention Provide education and explanation of THRR including how the numbers were predicted and where they are located for reference       Expected Outcomes Short Term: Able to state/look up THRR;Short Term: Able to use daily as guideline for intensity in rehab;Long Term: Able to use THRR to govern intensity when exercising independently       Able to check pulse independently Yes       Intervention Provide education and demonstration on how to check pulse in carotid and radial arteries.;Review the importance of being able to check your own pulse for safety during independent exercise       Expected Outcomes Short Term: Able to explain why pulse checking is important during independent exercise;Long Term: Able to check pulse independently and accurately       Understanding of Exercise Prescription Yes       Intervention Provide education, explanation, and written materials on patient's individual exercise prescription       Expected Outcomes Short Term: Able to explain program exercise prescription;Long Term: Able to explain home exercise prescription to exercise independently                Exercise Goals Re-Evaluation :  Exercise Goals  Re-Evaluation     Row Name 05/15/23 0803 05/28/23 1518 06/12/23 1553         Exercise Goal Re-Evaluation   Exercise Goals Review Increase Physical Activity;Increase Strength and Stamina;Understanding of Exercise Prescription Increase Physical Activity;Increase Strength and Stamina;Understanding of Exercise Prescription Increase Physical Activity;Increase Strength and Stamina;Understanding of Exercise Prescription     Comments Ed is doing well and is close to graduating from the program. He recently walked back up to 15 laps on the track and improved to level 4 on the T5 nustep. He also completed his post and improved by 16%. We will continue to monitor his progress until he graduates from the program. Ed did well in his first two sessions back in rehab since returning with a new referral. He worked at level 5 on the T4 nustep and was able to walk 15 laps on the track. He also did well with 3 lb hand weights for resistance training. We will continue to monitor his progress in the program. Ed is doing well in rehab. He increased to 25 laps walked on the track and continues to use 3 lb hand weights for resistance training. He also has stayed consistent at level 3 on the T6 and level 5 on the T4 nustep. We will continue to monitor his progress in the program.     Expected Outcomes -- Short: Continue to follow current exercise prescription. Long: Continue exercise to improve strength and stamina. Short: Continue to push for more laps on the track. Long: Continue exercise to improve strength and stamina.              Discharge Exercise Prescription (Final Exercise Prescription Changes):  Exercise  Prescription Changes - 06/12/23 1500       Response to Exercise   Blood Pressure (Admit) 112/70    Blood Pressure (Exercise) 118/60    Blood Pressure (Exit) 98/54    Heart Rate (Admit) 78 bpm    Heart Rate (Exercise) 96 bpm    Heart Rate (Exit) 70 bpm    Rating of Perceived Exertion (Exercise) 15     Symptoms none    Duration Continue with 30 min of aerobic exercise without signs/symptoms of physical distress.    Intensity THRR unchanged      Progression   Progression Continue to progress workloads to maintain intensity without signs/symptoms of physical distress.    Average METs 2.09      Resistance Training   Training Prescription Yes    Weight 3 lb    Reps 10-15      Interval Training   Interval Training No      NuStep   Level 5   T6: 3   Minutes 15    METs 3   T6: 2     Track   Laps 25    Minutes 15    METs 2.36      Oxygen   Maintain Oxygen Saturation 88% or higher             Nutrition:  Target Goals: Understanding of nutrition guidelines, daily intake of sodium 1500mg , cholesterol 200mg , calories 30% from fat and 7% or less from saturated fats, daily to have 5 or more servings of fruits and vegetables.  Education: All About Nutrition: -Group instruction provided by verbal, written material, interactive activities, discussions, models, and posters to present general guidelines for heart healthy nutrition including fat, fiber, MyPlate, the role of sodium in heart healthy nutrition, utilization of the nutrition label, and utilization of this knowledge for meal planning. Follow up email sent as well. Written material given at graduation. Flowsheet Row Cardiac Rehab from 11/19/2022 in [facility] Cardiac and Pulmonary Rehab  Education need identified 11/19/22       Biometrics:   Post Biometrics - 05/09/23 1605        Post  Biometrics   Height 6' (1.829 m)    Weight 222 lb (100.7 kg)    BMI (Calculated) 30.1    Single Leg Stand 2 seconds             Nutrition Therapy Plan and Nutrition Goals:   Nutrition Assessments:  MEDIFICTS Score Key: >=70 Need to make dietary changes  40-70 Heart Healthy Diet <= 40 Therapeutic Level Cholesterol Diet  Flowsheet Row Cardiac Rehab from 05/20/2023 in [facility] Cardiac and Pulmonary Rehab  Picture Your Plate Total  Score on Admission 64      Picture Your Plate Scores: <81 Unhealthy dietary pattern with much room for improvement. 41-50 Dietary pattern unlikely to meet recommendations for good health and room for improvement. 51-60 More healthful dietary pattern, with some room for improvement.  >60 Healthy dietary pattern, although there may be some specific behaviors that could be improved.    Nutrition Goals Re-Evaluation:  Nutrition Goals Re-Evaluation     Row Name 06/13/23 1603             Goals   Comment Patient deferred RD appointment.                Nutrition Goals Discharge (Final Nutrition Goals Re-Evaluation):  Nutrition Goals Re-Evaluation - 06/13/23 1603       Goals   Comment Patient deferred  RD appointment.             Psychosocial: Target Goals: Acknowledge presence or absence of significant depression and/or stress, maximize coping skills, provide positive support system. Participant is able to verbalize types and ability to use techniques and skills needed for reducing stress and depression.   Education: Stress, Anxiety, and Depression - Group verbal and visual presentation to define topics covered.  Reviews how body is impacted by stress, anxiety, and depression.  Also discusses healthy ways to reduce stress and to treat/manage anxiety and depression.  Written material given at graduation.   Education: Sleep Hygiene -Provides group verbal and written instruction about how sleep can affect your health.  Define sleep hygiene, discuss sleep cycles and impact of sleep habits. Review good sleep hygiene tips.    Initial Review & Psychosocial Screening:  Initial Psych Review & Screening - 05/20/23 1520       Initial Review   Current issues with None Identified      Family Dynamics   Good Support System? Yes      Barriers   Psychosocial barriers to participate in program There are no identifiable barriers or psychosocial needs.      Screening  Interventions   Interventions Encouraged to exercise;To provide support and resources with identified psychosocial needs;Provide feedback about the scores to participant    Expected Outcomes Short Term goal: Utilizing psychosocial counselor, staff and physician to assist with identification of specific Stressors or current issues interfering with healing process. Setting desired goal for each stressor or current issue identified.;Long Term Goal: Stressors or current issues are controlled or eliminated.;Short Term goal: Identification and review with participant of any Quality of Life or Depression concerns found by scoring the questionnaire.;Long Term goal: The participant improves quality of Life and PHQ9 Scores as seen by post scores and/or verbalization of changes             Quality of Life Scores:   Quality of Life - 05/20/23 1523       Quality of Life   Select Quality of Life      Quality of Life Scores   Health/Function Pre 27.6 %    Socioeconomic Pre 28 %    Psych/Spiritual Pre 30 %    Family Pre 27 %    GLOBAL Pre 28.13 %            Scores of 19 and below usually indicate a poorer quality of life in these areas.  A difference of  2-3 points is a clinically meaningful difference.  A difference of 2-3 points in the total score of the Quality of Life Index has been associated with significant improvement in overall quality of life, self-image, physical symptoms, and general health in studies assessing change in quality of life.  PHQ-9: Review Flowsheet  More data exists      06/13/2023 05/16/2023 11/19/2022 01/12/2020 10/28/2019  Depression screen PHQ 2/9  Decreased Interest 0 0 1 3 2   Down, Depressed, Hopeless 0 0 1 0 0  PHQ - 2 Score 0 0 2 3 2   Altered sleeping 0 0 1 1 3   Tired, decreased energy 1 3 1 2 2   Change in appetite 1 3 1 1 1   Feeling bad or failure about yourself  0 0 0 0 0  Trouble concentrating 0 0 0 0 1  Moving slowly or fidgety/restless 0 0 0 0 1  Suicidal  thoughts 0 0 0 1 0  PHQ-9 Score 2 6  5 8 10   Difficult doing work/chores Not difficult at all Not difficult at all Somewhat difficult Not difficult at all Somewhat difficult   Interpretation of Total Score  Total Score Depression Severity:  1-4 = Minimal depression, 5-9 = Mild depression, 10-14 = Moderate depression, 15-19 = Moderately severe depression, 20-27 = Severe depression   Psychosocial Evaluation and Intervention:  Psychosocial Evaluation - 05/20/23 1520       Psychosocial Evaluation & Interventions   Interventions Encouraged to exercise with the program and follow exercise prescription    Comments Ed is returning to the program after another stent placement. He is motivated to attend the program and has no barriers to attending. He has completed PT for balance and plans to increase his stamina in the program. He relies on his lady friend for support and company    Expected Outcomes Short: attend cardiac rehab for education and exercise Long: develop and maintain positive self care habits    Continue Psychosocial Services  Follow up required by staff             Psychosocial Re-Evaluation:  Psychosocial Re-Evaluation     Row Name 06/13/23 1557             Psychosocial Re-Evaluation   Current issues with Current Psychotropic Meds       Comments Reviewed patient health questionnaire (PHQ-9) with patient for follow up. Previously, patients score indicated signs/symptoms of depression.  Reviewed to see if patient is improving symptom wise while in program.  Score improved and patient states that it is because he is able to exercise and have more energy.       Expected Outcomes Short: Continue to attend [facility] regularly for regular exercise and social engagement. Long: Continue to improve symptoms and manage a positive mental state.       Interventions Encouraged to attend Cardiac Rehabilitation for the exercise       Continue Psychosocial Services  Follow up required by  staff                Psychosocial Discharge (Final Psychosocial Re-Evaluation):  Psychosocial Re-Evaluation - 06/13/23 1557       Psychosocial Re-Evaluation   Current issues with Current Psychotropic Meds    Comments Reviewed patient health questionnaire (PHQ-9) with patient for follow up. Previously, patients score indicated signs/symptoms of depression.  Reviewed to see if patient is improving symptom wise while in program.  Score improved and patient states that it is because he is able to exercise and have more energy.    Expected Outcomes Short: Continue to attend [facility] regularly for regular exercise and social engagement. Long: Continue to improve symptoms and manage a positive mental state.    Interventions Encouraged to attend Cardiac Rehabilitation for the exercise    Continue Psychosocial Services  Follow up required by staff             Vocational Rehabilitation: Provide vocational rehab assistance to qualifying candidates.   Vocational Rehab Evaluation & Intervention:  Vocational Rehab - 05/20/23 1519       Initial Vocational Rehab Evaluation & Intervention   Assessment shows need for Vocational Rehabilitation No             Education: Education Goals: Education classes will be provided on a variety of topics geared toward better understanding of heart health and risk factor modification. Participant will state understanding/return demonstration of topics presented as noted by education test scores.  Learning Barriers/Preferences:   General Cardiac  Education Topics:  AED/CPR: - Group verbal and written instruction with the use of models to demonstrate the basic use of the AED with the basic ABC's of resuscitation.   Anatomy and Cardiac Procedures: - Group verbal and visual presentation and models provide information about basic cardiac anatomy and function. Reviews the testing methods done to diagnose heart disease and the outcomes of the test  results. Describes the treatment choices: Medical Management, Angioplasty, or Coronary Bypass Surgery for treating various heart conditions including Myocardial Infarction, Angina, Valve Disease, and Cardiac Arrhythmias.  Written material given at graduation. Flowsheet Row Cardiac Rehab from 11/19/2022 in [facility] Cardiac and Pulmonary Rehab  Education need identified 11/19/22       Medication Safety: - Group verbal and visual instruction to review commonly prescribed medications for heart and lung disease. Reviews the medication, class of the drug, and side effects. Includes the steps to properly store meds and maintain the prescription regimen.  Written material given at graduation. Flowsheet Row Cardiac Rehab from 10/07/2019 in [facility] Cardiac and Pulmonary Rehab  Date 10/07/19  Educator SB  Instruction Review Code 1- Verbalizes Understanding       Intimacy: - Group verbal instruction through game format to discuss how heart and lung disease can affect sexual intimacy. Written material given at graduation..   Know Your Numbers and Heart Failure: - Group verbal and visual instruction to discuss disease risk factors for cardiac and pulmonary disease and treatment options.  Reviews associated critical values for Overweight/Obesity, Hypertension, Cholesterol, and Diabetes.  Discusses basics of heart failure: signs/symptoms and treatments.  Introduces Heart Failure Zone chart for action plan for heart failure.  Written material given at graduation.   Infection Prevention: - Provides verbal and written material to individual with discussion of infection control including proper hand washing and proper equipment cleaning during exercise session. Flowsheet Row Cardiac Rehab from 11/19/2022 in [facility] Cardiac and Pulmonary Rehab  Date 11/19/22  Educator NT  Instruction Review Code 1- Verbalizes Understanding       Falls Prevention: - Provides verbal and written material to individual with discussion  of falls prevention and safety. Flowsheet Row Cardiac Rehab from 11/19/2022 in [facility] Cardiac and Pulmonary Rehab  Date 11/19/22  Educator NT  Instruction Review Code 1- Verbalizes Understanding       Other: -Provides group and verbal instruction on various topics (see comments)   Knowledge Questionnaire Score:  Knowledge Questionnaire Score - 05/20/23 1519       Knowledge Questionnaire Score   Pre Score 24/26             Core Components/Risk Factors/Patient Goals at Admission:  Personal Goals and Risk Factors at Admission - 05/20/23 1520       Core Components/Risk Factors/Patient Goals on Admission    Weight Management Yes;Weight Maintenance    Intervention Weight Management: Develop a combined nutrition and exercise program designed to reach desired caloric intake, while maintaining appropriate intake of nutrient and fiber, sodium and fats, and appropriate energy expenditure required for the weight goal.;Weight Management: Provide education and appropriate resources to help participant work on and attain dietary goals.    Expected Outcomes Short Term: Continue to assess and modify interventions until short term weight is achieved;Long Term: Adherence to nutrition and physical activity/exercise program aimed toward attainment of established weight goal;Weight Maintenance: Understanding of the daily nutrition guidelines, which includes 25-35% calories from fat, 7% or less cal from saturated fats, less than 200mg  cholesterol, less than 1.5gm of sodium, & 5  or more servings of fruits and vegetables daily    Diabetes Yes    Intervention Provide education about signs/symptoms and action to take for hypo/hyperglycemia.;Provide education about proper nutrition, including hydration, and aerobic/resistive exercise prescription along with prescribed medications to achieve blood glucose in normal ranges: Fasting glucose 65-99 mg/dL    Expected Outcomes Short Term: Participant verbalizes  understanding of the signs/symptoms and immediate care of hyper/hypoglycemia, proper foot care and importance of medication, aerobic/resistive exercise and nutrition plan for blood glucose control.;Long Term: Attainment of HbA1C < 7%.    Heart Failure Yes    Intervention Provide a combined exercise and nutrition program that is supplemented with education, support and counseling about heart failure. Directed toward relieving symptoms such as shortness of breath, decreased exercise tolerance, and extremity edema.    Expected Outcomes Improve functional capacity of life;Short term: Attendance in program 2-3 days a week with increased exercise capacity. Reported lower sodium intake. Reported increased fruit and vegetable intake. Reports medication compliance.;Short term: Daily weights obtained and reported for increase. Utilizing diuretic protocols set by physician.;Long term: Adoption of self-care skills and reduction of barriers for early signs and symptoms recognition and intervention leading to self-care maintenance.    Hypertension Yes    Intervention Provide education on lifestyle modifcations including regular physical activity/exercise, weight management, moderate sodium restriction and increased consumption of fresh fruit, vegetables, and low fat dairy, alcohol moderation, and smoking cessation.;Monitor prescription use compliance.    Expected Outcomes Short Term: Continued assessment and intervention until BP is < 140/54mm HG in hypertensive participants. < 130/29mm HG in hypertensive participants with diabetes, heart failure or chronic kidney disease.;Long Term: Maintenance of blood pressure at goal levels.    Lipids Yes    Intervention Provide education and support for participant on nutrition & aerobic/resistive exercise along with prescribed medications to achieve LDL 70mg , HDL >40mg .    Expected Outcomes Short Term: Participant states understanding of desired cholesterol values and is compliant  with medications prescribed. Participant is following exercise prescription and nutrition guidelines.;Long Term: Cholesterol controlled with medications as prescribed, with individualized exercise RX and with personalized nutrition plan. Value goals: LDL < 70mg , HDL > 40 mg.             Education:Diabetes - Individual verbal and written instruction to review signs/symptoms of diabetes, desired ranges of glucose level fasting, after meals and with exercise. Acknowledge that pre and post exercise glucose checks will be done for 3 sessions at entry of program. Flowsheet Row Cardiac Rehab from 01/13/2020 in [facility] Cardiac and Pulmonary Rehab  Date 01/13/20  Educator [facility]  Instruction Review Code 1- Verbalizes Understanding       Core Components/Risk Factors/Patient Goals Review:   Goals and Risk Factor Review     Row Name 06/13/23 1551             Core Components/Risk Factors/Patient Goals Review   Personal Goals Review Weight Management/Obesity       Review Ed is continuing the program and is working toward losing weight. He is 224 pounds currently. and wants to reach a weight goal of 200 pounds. He is going to work toward losing a few pounds.       Expected Outcomes Short: lose some weight. Long: reach weight goal.                Core Components/Risk Factors/Patient Goals at Discharge (Final Review):   Goals and Risk Factor Review - 06/13/23 1551  Core Components/Risk Factors/Patient Goals Review   Personal Goals Review Weight Management/Obesity    Review Ed is continuing the program and is working toward losing weight. He is 224 pounds currently. and wants to reach a weight goal of 200 pounds. He is going to work toward losing a few pounds.    Expected Outcomes Short: lose some weight. Long: reach weight goal.             ITP Comments:  ITP Comments     Row Name 04/24/23 0800 05/16/23 1553 05/20/23 1537 05/22/23 0754 06/19/23 1206   ITP Comments 30 Day review  completed. Medical Director ITP review done, changes made as directed, and signed approval by Medical Director. Casyn graduated today from  rehab with 36 sessions completed.  Details of the patient's exercise prescription and what He needs to do in order to continue the prescription and progress were discussed with patient.  Patient was given a copy of prescription and goals.  Patient verbalized understanding. Tramell plans to continue to exercise by attending structured exercise program. First full day of exercise!  Patient was oriented to gym and equipment including functions, settings, policies, and procedures.  Patient's individual exercise prescription and treatment plan were reviewed.  All starting workloads were established based on the results of the 6 minute walk test done at initial orientation visit.  The plan for exercise progression was also introduced and progression will be customized based on patient's performance and goals. 30 Day review completed. Medical Director ITP review done, changes made as directed, and signed approval by Medical Director. New patient new diagnosis. 30 Day review completed. Medical Director ITP review done, changes made as directed, and signed approval by Medical Director.            Comments:

## 2023-06-20 ENCOUNTER — Encounter

## 2023-06-24 ENCOUNTER — Encounter: Admitting: *Deleted

## 2023-06-24 DIAGNOSIS — Z955 Presence of coronary angioplasty implant and graft: Secondary | ICD-10-CM | POA: Diagnosis not present

## 2023-06-24 DIAGNOSIS — I214 Non-ST elevation (NSTEMI) myocardial infarction: Secondary | ICD-10-CM

## 2023-06-24 NOTE — Progress Notes (Signed)
 Daily Session Note  Patient Details  Name: Jaicob Dia [facility]. MRN: [id number] Date of Birth: 06-27-1945 Referring Provider:   Flowsheet Row Cardiac Rehab from 05/20/2023 in [facility] Cardiac and Pulmonary Rehab  Referring Provider Dr. Debborah Fairly       Encounter Date: 06/24/2023  Check In:  Session Check In - 06/24/23 1718       Check-In   Supervising physician immediately available to respond to emergencies See telemetry face sheet for immediately available ER MD    Location [facility]-Cardiac & Pulmonary Rehab    Staff Present Theone Fitting, RN, BSN, Debroah Fanning BS, ACSM CEP, Exercise Physiologist;Susanne Bice, RN, BSN, CCRP    Virtual Visit No    Medication changes reported     No    Fall or balance concerns reported    No    Tobacco Cessation No Change    Warm-up and Cool-down Performed on first and last piece of equipment    Resistance Training Performed Yes    VAD Patient? No    PAD/SET Patient? No      Pain Assessment   Currently in Pain? No/denies                Social History   Tobacco Use  Smoking Status Former  Smokeless Tobacco Never  Tobacco Comments   Quit over 40 years ago    Goals Met:  Independence with exercise equipment Exercise tolerated well No report of concerns or symptoms today  Goals Unmet:  Not Applicable  Comments: Pt able to follow exercise prescription today without complaint.  Will continue to monitor for progression.    Dr. Firman Hughes is Medical Director for [facility] Cardiac Rehabilitation.  Dr. Fuad Aleskerov is Medical Director for [facility] Pulmonary Rehabilitation.

## 2023-06-24 NOTE — Progress Notes (Deleted)
 Daily Session Note  Patient Details  Name: Devin [name]. MRN: [id number] Date of Birth: Aug 11, 1945 Referring Provider:   Flowsheet Row Cardiac Rehab from 05/20/2023 in [facility] Cardiac and Pulmonary Rehab  Referring Provider Dr. Debborah Fairly       Encounter Date: 06/24/2023  Check In:      Social History   Tobacco Use  Smoking Status Former  Smokeless Tobacco Never  Tobacco Comments   Quit over 40 years ago    Goals Met:  Independence with exercise equipment Exercise tolerated well No report of concerns or symptoms today  Goals Unmet:  Not Applicable  Comments: Pt able to follow exercise prescription today without complaint.  Will continue to monitor for progression.    Dr. Firman Hughes is Medical Director for [facility] Cardiac Rehabilitation.  Dr. Fuad Aleskerov is Medical Director for [facility] Pulmonary Rehabilitation.

## 2023-06-27 ENCOUNTER — Encounter: Admitting: *Deleted

## 2023-06-27 DIAGNOSIS — I214 Non-ST elevation (NSTEMI) myocardial infarction: Secondary | ICD-10-CM

## 2023-06-27 DIAGNOSIS — Z955 Presence of coronary angioplasty implant and graft: Secondary | ICD-10-CM | POA: Diagnosis not present

## 2023-06-27 NOTE — Progress Notes (Signed)
 Daily Session Note  Patient Details  Name: Devin [name] [facility]. MRN: [id number] Date of Birth: [birth year] Referring Provider:   Flowsheet Row Cardiac Rehab from 05/20/2023 in [facility] Cardiac and Pulmonary Rehab  Referring Provider Dr. Debborah Fairly       Encounter Date: 06/27/2023  Check In:  Session Check In - 06/27/23 1541       Check-In   Supervising physician immediately available to respond to emergencies See telemetry face sheet for immediately available ER MD    Location [facility]-Cardiac & Pulmonary Rehab    Staff Present Sue Em RN,BSN;Maxon Conetta BS, Exercise Physiologist;Laureen Bevin Bucks, BS, RRT, CPFT;Noah Tickle, BS, Exercise Physiologist    Virtual Visit No    Medication changes reported     No    Fall or balance concerns reported    No    Warm-up and Cool-down Performed on first and last piece of equipment    Resistance Training Performed Yes    VAD Patient? No    PAD/SET Patient? No      Pain Assessment   Currently in Pain? No/denies                Social History   Tobacco Use  Smoking Status Former  Smokeless Tobacco Never  Tobacco Comments   Quit over 40 years ago    Goals Met:  Independence with exercise equipment Exercise tolerated well No report of concerns or symptoms today Strength training completed today  Goals Unmet:  Not Applicable  Comments: Pt able to follow exercise prescription today without complaint.  Will continue to monitor for progression.    Dr. Firman Hughes is Medical Director for [facility] Cardiac Rehabilitation.  Dr. Fuad Aleskerov is Medical Director for [facility] Pulmonary Rehabilitation.

## 2023-07-01 ENCOUNTER — Encounter: Admitting: *Deleted

## 2023-07-01 DIAGNOSIS — Z955 Presence of coronary angioplasty implant and graft: Secondary | ICD-10-CM | POA: Diagnosis not present

## 2023-07-01 NOTE — Progress Notes (Signed)
 Daily Session Note  Patient Details  Name: Devin [name]. MRN: [id number] Date of Birth: 05/07/1945 Referring Provider:   Flowsheet Row Cardiac Rehab from 05/20/2023 in [facility] Cardiac and Pulmonary Rehab  Referring Provider Dr. Debborah Fairly       Encounter Date: 07/01/2023  Check In:  Session Check In - 07/01/23 1557       Check-In   Supervising physician immediately available to respond to emergencies See telemetry face sheet for immediately available ER MD    Location [facility]-Cardiac & Pulmonary Rehab    Staff Present Sue Em RN,BSN;Joseph Nancey Awkward;Maud Sorenson, RN, BSN, CCRP;Maxon Conetta BS, Exercise Physiologist    Virtual Visit No    Medication changes reported     No    Fall or balance concerns reported    No    Warm-up and Cool-down Performed on first and last piece of equipment    Resistance Training Performed Yes    VAD Patient? No    PAD/SET Patient? No      Pain Assessment   Currently in Pain? No/denies                Social History   Tobacco Use  Smoking Status Former  Smokeless Tobacco Never  Tobacco Comments   Quit over 40 years ago    Goals Met:  Independence with exercise equipment Exercise tolerated well No report of concerns or symptoms today Strength training completed today  Goals Unmet:  Not Applicable  Comments: Pt able to follow exercise prescription today without complaint.  Will continue to monitor for progression.    Dr. Firman Hughes is Medical Director for [facility] Cardiac Rehabilitation.  Dr. Fuad Aleskerov is Medical Director for [facility] Pulmonary Rehabilitation.

## 2023-07-04 ENCOUNTER — Encounter: Admitting: *Deleted

## 2023-07-04 DIAGNOSIS — Z955 Presence of coronary angioplasty implant and graft: Secondary | ICD-10-CM

## 2023-07-04 NOTE — Progress Notes (Signed)
 Daily Session Note  Patient Details  Name: Devin [name]. MRN: [id number] Date of Birth: March 17, 1945 Referring Provider:   Flowsheet Row Cardiac Rehab from 05/20/2023 in [name] Cardiac and Pulmonary Rehab  Referring Provider Dr. Debborah Fairly       Encounter Date: 07/04/2023  Check In:  Session Check In - 07/04/23 1531       Check-In   Supervising physician immediately available to respond to emergencies See telemetry face sheet for immediately available ER MD    Location [facility]-Cardiac & Pulmonary Rehab    Staff Present Sue Em RN,BSN;Joseph [name] BS, Exercise Physiologist;Noah Tickle, BS, Exercise Physiologist    Virtual Visit No    Medication changes reported     No    Fall or balance concerns reported    No    Warm-up and Cool-down Performed on first and last piece of equipment    Resistance Training Performed Yes    VAD Patient? No    PAD/SET Patient? No      Pain Assessment   Currently in Pain? No/denies                Social History   Tobacco Use  Smoking Status Former  Smokeless Tobacco Never  Tobacco Comments   Quit over 40 years ago    Goals Met:  Independence with exercise equipment Exercise tolerated well No report of concerns or symptoms today Strength training completed today  Goals Unmet:  Not Applicable  Comments: Pt able to follow exercise prescription today without complaint.  Will continue to monitor for progression.    Dr. Firman Hughes is Medical Director for [facility] Cardiac Rehabilitation.  Dr. Fuad Aleskerov is Medical Director for [facility] Pulmonary Rehabilitation.

## 2023-07-08 ENCOUNTER — Encounter: Admitting: *Deleted

## 2023-07-08 DIAGNOSIS — Z955 Presence of coronary angioplasty implant and graft: Secondary | ICD-10-CM | POA: Diagnosis not present

## 2023-07-08 NOTE — Progress Notes (Signed)
 Daily Session Note  Patient Details  Name: Freddie [facility]. MRN: [id number] Date of Birth: May 12, 1945 Referring Provider:   Flowsheet Row Cardiac Rehab from 05/20/2023 in [facility] Cardiac and Pulmonary Rehab  Referring Provider Dr. Debborah Fairly       Encounter Date: 07/08/2023  Check In:  Session Check In - 07/08/23 1548       Check-In   Supervising physician immediately available to respond to emergencies See telemetry face sheet for immediately available ER MD    Location [facility]-Cardiac & Pulmonary Rehab    Staff Present Sue Em RN,BSN;Maxon Conetta BS, Exercise Physiologist;Noah Tickle, BS, Exercise Physiologist;Susanne Bice, RN, BSN, CCRP    Virtual Visit No    Medication changes reported     No    Fall or balance concerns reported    No    Warm-up and Cool-down Performed on first and last piece of equipment    Resistance Training Performed Yes    VAD Patient? No    PAD/SET Patient? No      Pain Assessment   Currently in Pain? No/denies                Social History   Tobacco Use  Smoking Status Former  Smokeless Tobacco Never  Tobacco Comments   Quit over 40 years ago    Goals Met:  Independence with exercise equipment Exercise tolerated well No report of concerns or symptoms today Strength training completed today  Goals Unmet:  Not Applicable  Comments: Pt able to follow exercise prescription today without complaint.  Will continue to monitor for progression.    Dr. Firman Hughes is Medical Director for [facility] Cardiac Rehabilitation.  Dr. Fuad Aleskerov is Medical Director for [facility] Pulmonary Rehabilitation.

## 2023-07-09 ENCOUNTER — Ambulatory Visit: Admitting: Cardiovascular Disease

## 2023-07-09 ENCOUNTER — Encounter: Payer: Self-pay | Admitting: Cardiovascular Disease

## 2023-07-09 VITALS — BP 115/68 | HR 80 | Ht 72.0 in | Wt 219.4 lb

## 2023-07-09 DIAGNOSIS — Z0130 Encounter for examination of blood pressure without abnormal findings: Secondary | ICD-10-CM

## 2023-07-09 DIAGNOSIS — I5020 Unspecified systolic (congestive) heart failure: Secondary | ICD-10-CM | POA: Diagnosis not present

## 2023-07-09 DIAGNOSIS — I2510 Atherosclerotic heart disease of native coronary artery without angina pectoris: Secondary | ICD-10-CM

## 2023-07-09 DIAGNOSIS — I25700 Atherosclerosis of coronary artery bypass graft(s), unspecified, with unstable angina pectoris: Secondary | ICD-10-CM

## 2023-07-09 DIAGNOSIS — I739 Peripheral vascular disease, unspecified: Secondary | ICD-10-CM

## 2023-07-09 DIAGNOSIS — R0789 Other chest pain: Secondary | ICD-10-CM | POA: Diagnosis not present

## 2023-07-09 DIAGNOSIS — R0602 Shortness of breath: Secondary | ICD-10-CM | POA: Diagnosis not present

## 2023-07-09 DIAGNOSIS — I4891 Unspecified atrial fibrillation: Secondary | ICD-10-CM

## 2023-07-09 MED ORDER — AMIODARONE HCL 200 MG PO TABS: 200.0000 mg | ORAL_TABLET | Freq: Every day | ORAL | 2 refills | Status: DC

## 2023-07-09 NOTE — Progress Notes (Addendum)
 Cardiology Office Note   Date:  07/09/2023   ID:  Devin [name]., DOB 08/13/1945, MRN [id number]  PCP:  Devin Leventhal, MD  Cardiologist:  Devin Fairly, MD      History of Present Illness: Devin [name]. is a 78 y.o. 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   Hypertension    Ischemic cardiomyopathy    MI, old    Peripheral neuropathy    PFO (patent foramen ovale) 05/2019   s/p repair   Sleep apnea    Spinal stenosis of lumbar region    TIA (transient ischemic attack)      Past Surgical History:  Procedure Laterality Date   APPENDECTOMY     BREAST SURGERY Right    benign mass   CARDIOVERSION Right 12/15/2019   Procedure: CARDIOVERSION;  Surgeon: Devin Cornwall, MD;  Location: [facility];  Service: Cardiovascular;  Laterality: Right;   CATARACT EXTRACTION, BILATERAL     COLONOSCOPY     CORONARY ANGIOPLASTY WITH STENT PLACEMENT     CORONARY ARTERY BYPASS GRAFT  05/2019   LIMA-LAD   CORONARY STENT INTERVENTION N/A 10/15/2022   Procedure: CORONARY STENT INTERVENTION;  Surgeon: Devin Brace, MD;  Location: [facility];  Service: Cardiovascular;  Laterality: N/A;   LEFT HEART CATH N/A 10/15/2022   Procedure: Left Heart Cath;  Surgeon: Devin Brace, MD;  Location: [facility];  Service: Cardiovascular;  Laterality: N/A;   LEFT HEART CATH AND CORS/GRAFTS ANGIOGRAPHY N/A 05/21/2019   Procedure: LEFT HEART CATH AND CORONARY ANGIOGRAPHY;  Surgeon:  Devin Cornwall, MD;  Location: [facility];  Service: Cardiovascular;  Laterality: N/A;   LUMBAR LAMINECTOMY/DECOMPRESSION MICRODISCECTOMY N/A 07/04/2020   Procedure: L4-5 LAMINECTOMY;  Surgeon: Devin Brittle, MD;  Location: [facility];  Service: Neurosurgery;  Laterality: N/A;   REPLACEMENT TOTAL KNEE BILATERAL       Current Outpatient Medications  Medication Sig Dispense Refill   albuterol  (VENTOLIN  HFA) 108 (90 Base) MCG/ACT inhaler Inhale 2 puffs into the lungs every 6 (six) hours as needed for wheezing or shortness of breath.     apixaban  (ELIQUIS ) 5 MG TABS tablet Take 1 tablet (5 mg total) by mouth 2 (two) times daily. 60 tablet 3   atorvastatin  (LIPITOR ) 80 MG tablet Take 80 mg by mouth daily.     buPROPion  (WELLBUTRIN  XL) 300 MG 24 hr tablet Take 300 mg by mouth daily.     clopidogrel  (PLAVIX ) 75 MG tablet Take 1 tablet (75 mg total) by mouth daily with breakfast. 30 tablet 11   docusate (COLACE) 60 MG/15ML syrup Take 60 mg by mouth daily.     EPINEPHrine  0.3 mg/0.3 mL IJ SOAJ injection Inject 0.3 mg into the muscle as needed for anaphylaxis.     ezetimibe  (ZETIA ) 10 MG tablet Take 10 mg by mouth daily.  fluticasone  (FLONASE ) 50 MCG/ACT nasal spray SPRAY 2 SPRAYS INTO EACH NOSTRIL EVERY DAY     gabapentin  (NEURONTIN ) 300 MG capsule Take 300 mg by mouth 2 (two) times daily.     loratadine  (CLARITIN ) 10 MG tablet Take 10 mg by mouth daily.     metFORMIN  (GLUCOPHAGE ) 500 MG tablet Take 1,000 mg by mouth 2 (two) times daily.     metoprolol  succinate (TOPROL  XL) 50 MG 24 hr tablet Take 1 tablet (50 mg total) by mouth daily. Take with or immediately following a meal. 30 tablet 11   nitroGLYCERIN  (NITROSTAT ) 0.4 MG SL tablet Place 0.4 mg under the tongue every 5 (five) minutes as needed for chest pain.     pantoprazole  (PROTONIX ) 40 MG tablet TAKE 1 TABLET BY MOUTH EVERY DAY 30 tablet 1   ranolazine  (RANEXA ) 500 MG 12 hr tablet Take 1 tablet (500 mg total) by mouth 2 (two) times  daily. 60 tablet 3   sacubitril -valsartan  (ENTRESTO ) 97-103 MG TAKE 1 TABLET BY MOUTH TWICE A DAY 180 tablet 1   Semaglutide,0.25 or 0.5MG /DOS, 2 MG/3ML SOPN Inject 0.25 mg into the skin once a week.     amiodarone  (PACERONE ) 200 MG tablet Take 1 tablet (200 mg total) by mouth daily. 30 tablet 2   No current facility-administered medications for this visit.    Allergies:   Morphine , Yellow jacket venom [bee venom], and Trazodone     Social History:   reports that he has quit smoking. He has never used smokeless tobacco. He reports that he does not currently use alcohol. He reports that he does not use drugs.   Family History:  family history includes Osteoarthritis in his mother; Other (age of onset: 32) in his father.    ROS:     Review of Systems  Constitutional: Negative.   HENT: Negative.    Eyes: Negative.   Respiratory: Negative.    Gastrointestinal: Negative.   Genitourinary: Negative.   Musculoskeletal: Negative.   Skin: Negative.   Neurological: Negative.   Endo/Heme/Allergies: Negative.   Psychiatric/Behavioral: Negative.    All other systems reviewed and are negative.     All other systems are reviewed and negative.    PHYSICAL EXAM: VS:  BP 115/68   Pulse 80   Ht 6' (1.829 m)   Wt 219 lb 6.4 oz (99.5 kg)   SpO2 98%   BMI 29.76 kg/m  , BMI Body mass index is 29.76 kg/m. Last weight:  Wt Readings from Last 3 Encounters:  07/09/23 219 lb 6.4 oz (99.5 kg)  06/07/23 225 lb (102.1 kg)  05/09/23 222 lb (100.7 kg)     Physical Exam Vitals reviewed.  Constitutional:      Appearance: Normal appearance. He is normal weight.  HENT:     Head: Normocephalic.     Nose: Nose normal.     Mouth/Throat:     Mouth: Mucous membranes are moist.  Eyes:     Pupils: Pupils are equal, round, and reactive to light.  Cardiovascular:     Rate and Rhythm: Normal rate and regular rhythm.     Pulses: Normal pulses.     Heart sounds: Normal heart sounds.  Pulmonary:      Effort: Pulmonary effort is normal.  Abdominal:     General: Abdomen is flat. Bowel sounds are normal.  Musculoskeletal:        General: Normal range of motion.     Cervical back: Normal range of motion.  Skin:  General: Skin is warm.  Neurological:     General: No focal deficit present.     Mental Status: He is alert.  Psychiatric:        Mood and Affect: Mood normal.       EKG:   Recent Labs: 10/16/2022: BUN 10; Creatinine, Ser 0.89; Hemoglobin 13.8; Magnesium  2.3; Platelets 336; Potassium 4.1; Sodium 139    Lipid Panel    Component Value Date/Time   CHOL 105 10/13/2022 0313   TRIG 55 10/13/2022 0313   HDL 59 10/13/2022 0313   CHOLHDL 1.8 10/13/2022 0313   VLDL 11 10/13/2022 0313   LDLCALC 35 10/13/2022 0313      Other studies Reviewed: Additional studies/ records that were reviewed today include:  Review of the above records demonstrates:       No data to display            ASSESSMENT AND PLAN:    ICD-10-CM   1. Other chest pain  R07.89 amiodarone  (PACERONE ) 200 MG tablet   no chest pain or SOB    2. Coronary artery disease involving native coronary artery of native heart without angina pectoris  I25.10 amiodarone  (PACERONE ) 200 MG tablet   cardiac cath8/5/24 had LIMA to LAD patent and no sinificant disease in RCA and proximal LCX stent. BUt went [facility] and had PCI of stent again 2/25    3. HFrEF (heart failure with reduced ejection fraction) (HCC)  I50.20 amiodarone  (PACERONE ) 200 MG tablet   on GDMT doing well    4. SOB (shortness of breath)  R06.02 amiodarone  (PACERONE ) 200 MG tablet    5. Coronary artery disease involving coronary bypass graft with unstable angina pectoris, unspecified whether native or transplanted heart (HCC)  I25.700 amiodarone  (PACERONE ) 200 MG tablet    6. Atrial fibrillation status post cardioversion (HCC)  I48.91 amiodarone  (PACERONE ) 200 MG tablet    7. PAD (peripheral artery disease) (HCC)  I73.9 amiodarone  (PACERONE ) 200  MG tablet       Problem List Items Addressed This Visit       Cardiovascular and Mediastinum   CAD (coronary artery disease)   Relevant Medications   amiodarone  (PACERONE ) 200 MG tablet   Atrial fibrillation status post cardioversion (HCC)   Relevant Medications   amiodarone  (PACERONE ) 200 MG tablet   PAD (peripheral artery disease) (HCC)   Relevant Medications   amiodarone  (PACERONE ) 200 MG tablet   HFrEF (heart failure with reduced ejection fraction) (HCC)   Relevant Medications   amiodarone  (PACERONE ) 200 MG tablet     Other   Chest pain - Primary   Relevant Medications   amiodarone  (PACERONE ) 200 MG tablet   Other Visit Diagnoses       SOB (shortness of breath)       Relevant Medications   amiodarone  (PACERONE ) 200 MG tablet          Disposition:   Return in about 3 months (around 10/08/2023).    Total time spent: 30 minutes  Signed,  Devin Fairly, MD  07/09/2023 11:31 AM    [facility]

## 2023-07-11 ENCOUNTER — Encounter: Attending: Cardiovascular Disease | Admitting: *Deleted

## 2023-07-11 DIAGNOSIS — Z955 Presence of coronary angioplasty implant and graft: Secondary | ICD-10-CM | POA: Diagnosis present

## 2023-07-11 DIAGNOSIS — I214 Non-ST elevation (NSTEMI) myocardial infarction: Secondary | ICD-10-CM | POA: Insufficient documentation

## 2023-07-11 NOTE — Progress Notes (Signed)
 Daily Session Note  Patient Details  Name: Devin [name] [facility]. MRN: [id number] Date of Birth: 1946-02-10 Referring Provider:   Flowsheet Row Cardiac Rehab from 05/20/2023 in [facility]  Referring Provider Dr. Debborah Fairly       Encounter Date: 07/11/2023  Check In:  Session Check In - 07/11/23 1532       Check-In   Supervising physician immediately available to respond to emergencies See telemetry face sheet for immediately available ER MD    Location [facility]    Staff Present Sue Em RN,BSN;Joseph Nancey Awkward;Maud Sorenson, RN, BSN, CCRP;Maxon Conetta BS, Exercise Physiologist    Virtual Visit No    Medication changes reported     No    Fall or balance concerns reported    No    Warm-up and Cool-down Performed on first and last piece of equipment    Resistance Training Performed Yes    VAD Patient? No    PAD/SET Patient? No      Pain Assessment   Currently in Pain? No/denies                Social History   Tobacco Use  Smoking Status Former  Smokeless Tobacco Never  Tobacco Comments   Quit over 40 years ago    Goals Met:  Independence with exercise equipment Exercise tolerated well No report of concerns or symptoms today Strength training completed today  Goals Unmet:  Not Applicable  Comments: Pt able to follow exercise prescription today without complaint.  Will continue to monitor for progression.    Dr. Firman Hughes is Medical Director for [facility] Cardiac Rehabilitation.  Dr. Fuad Aleskerov is Medical Director for [facility] Pulmonary Rehabilitation.

## 2023-07-14 ENCOUNTER — Other Ambulatory Visit: Payer: Self-pay | Admitting: Cardiovascular Disease

## 2023-07-14 DIAGNOSIS — I5020 Unspecified systolic (congestive) heart failure: Secondary | ICD-10-CM

## 2023-07-14 DIAGNOSIS — I4891 Unspecified atrial fibrillation: Secondary | ICD-10-CM

## 2023-07-14 DIAGNOSIS — I2510 Atherosclerotic heart disease of native coronary artery without angina pectoris: Secondary | ICD-10-CM

## 2023-07-14 DIAGNOSIS — I25700 Atherosclerosis of coronary artery bypass graft(s), unspecified, with unstable angina pectoris: Secondary | ICD-10-CM

## 2023-07-14 DIAGNOSIS — I739 Peripheral vascular disease, unspecified: Secondary | ICD-10-CM

## 2023-07-15 ENCOUNTER — Encounter

## 2023-07-17 ENCOUNTER — Other Ambulatory Visit: Payer: Self-pay

## 2023-07-17 ENCOUNTER — Encounter: Payer: Self-pay | Admitting: *Deleted

## 2023-07-17 DIAGNOSIS — I25700 Atherosclerosis of coronary artery bypass graft(s), unspecified, with unstable angina pectoris: Secondary | ICD-10-CM

## 2023-07-17 DIAGNOSIS — R0602 Shortness of breath: Secondary | ICD-10-CM

## 2023-07-17 DIAGNOSIS — I4891 Unspecified atrial fibrillation: Secondary | ICD-10-CM

## 2023-07-17 DIAGNOSIS — R0789 Other chest pain: Secondary | ICD-10-CM

## 2023-07-17 DIAGNOSIS — I5020 Unspecified systolic (congestive) heart failure: Secondary | ICD-10-CM

## 2023-07-17 DIAGNOSIS — I2510 Atherosclerotic heart disease of native coronary artery without angina pectoris: Secondary | ICD-10-CM

## 2023-07-17 DIAGNOSIS — I739 Peripheral vascular disease, unspecified: Secondary | ICD-10-CM

## 2023-07-17 DIAGNOSIS — Z955 Presence of coronary angioplasty implant and graft: Secondary | ICD-10-CM

## 2023-07-17 MED ORDER — AMIODARONE HCL 200 MG PO TABS: 200.0000 mg | ORAL_TABLET | Freq: Every day | ORAL | 2 refills | Status: DC

## 2023-07-17 NOTE — Progress Notes (Signed)
 Cardiac Individual Treatment Plan  Patient Details  Name: [name] MRN: [id number] Date of Birth: 07-03-1945 Referring Provider:   Flowsheet Row Cardiac Rehab from 05/20/2023 in [facility] Cardiac and Pulmonary Rehab  Referring Provider Dr. Debborah Fairly       Initial Encounter Date:  Flowsheet Row Cardiac Rehab from 05/20/2023 in [facility] Cardiac and Pulmonary Rehab  Date 05/20/23       Visit Diagnosis: Status post coronary artery stent placement  Patient's Home Medications on Admission:  Current Outpatient Medications:    albuterol  (VENTOLIN  HFA) 108 (90 Base) MCG/ACT inhaler, Inhale 2 puffs into the lungs every 6 (six) hours as needed for wheezing or shortness of breath., Disp: , Rfl:    amiodarone  (PACERONE ) 200 MG tablet, Take 1 tablet (200 mg total) by mouth daily., Disp: 30 tablet, Rfl: 2   apixaban  (ELIQUIS ) 5 MG TABS tablet, Take 1 tablet (5 mg total) by mouth 2 (two) times daily., Disp: 60 tablet, Rfl: 3   atorvastatin  (LIPITOR ) 80 MG tablet, Take 80 mg by mouth daily., Disp: , Rfl:    buPROPion  (WELLBUTRIN  XL) 300 MG 24 hr tablet, Take 300 mg by mouth daily., Disp: , Rfl:    clopidogrel  (PLAVIX ) 75 MG tablet, Take 1 tablet (75 mg total) by mouth daily with breakfast., Disp: 30 tablet, Rfl: 11   docusate (COLACE) 60 MG/15ML syrup, Take 60 mg by mouth daily., Disp: , Rfl:    EPINEPHrine  0.3 mg/0.3 mL IJ SOAJ injection, Inject 0.3 mg into the muscle as needed for anaphylaxis., Disp: , Rfl:    ezetimibe  (ZETIA ) 10 MG tablet, Take 10 mg by mouth daily., Disp: , Rfl:    fluticasone  (FLONASE ) 50 MCG/ACT nasal spray, SPRAY 2 SPRAYS INTO EACH NOSTRIL EVERY DAY, Disp: , Rfl:    gabapentin  (NEURONTIN ) 300 MG capsule, Take 300 mg by mouth 2 (two) times daily., Disp: , Rfl:    loratadine  (CLARITIN ) 10 MG tablet, Take 10 mg by mouth daily., Disp: , Rfl:    metFORMIN  (GLUCOPHAGE ) 500 MG tablet, Take 1,000 mg by mouth 2 (two) times daily., Disp: , Rfl:    metoprolol  succinate (TOPROL   XL) 50 MG 24 hr tablet, Take 1 tablet (50 mg total) by mouth daily. Take with or immediately following a meal., Disp: 30 tablet, Rfl: 11   nitroGLYCERIN  (NITROSTAT ) 0.4 MG SL tablet, Place 0.4 mg under the tongue every 5 (five) minutes as needed for chest pain., Disp: , Rfl:    pantoprazole  (PROTONIX ) 40 MG tablet, TAKE 1 TABLET BY MOUTH EVERY DAY, Disp: 30 tablet, Rfl: 1   ranolazine  (RANEXA ) 500 MG 12 hr tablet, Take 1 tablet (500 mg total) by mouth 2 (two) times daily., Disp: 60 tablet, Rfl: 3   sacubitril -valsartan  (ENTRESTO ) 97-103 MG, TAKE 1 TABLET BY MOUTH TWICE A DAY, Disp: 180 tablet, Rfl: 1   Semaglutide,0.25 or 0.5MG /DOS, 2 MG/3ML SOPN, Inject 0.25 mg into the skin once a week., Disp: , Rfl:   Past Medical History: 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   Hypertension  Ischemic cardiomyopathy    MI, old    Peripheral neuropathy    PFO (patent foramen ovale) 05/2019   s/p repair   Sleep apnea    Spinal stenosis of lumbar region    TIA (transient ischemic attack)     Tobacco Use: Social History   Tobacco Use  Smoking Status Former  Smokeless Tobacco Never  Tobacco Comments   Quit over 40 years ago    Labs: Review Flowsheet  More data exists      Latest Ref Rng & Units 04/08/2020 07/04/2020 04/12/2022 04/13/2022 10/13/2022  Labs for ITP Cardiac and Pulmonary Rehab  Cholestrol 0 - 200 mg/dL - - - - 782   LDL (calc) 0 - 99 mg/dL - - - - 35   HDL-C >95 mg/dL - - - - 59   Trlycerides <150 mg/dL - - - - 55   Hemoglobin A1c 4.8 - 5.6 % 5.8  - - 5.5  6.3   Bicarbonate 20.0 - 28.0 mmol/L - - 23.9  - -  TCO2 22 - 32 mmol/L -  24  - - -  Acid-base deficit 0.0 - 2.0 mmol/L - - 0.1  - -  O2 Saturation % - - 90.3  - -     Exercise Target Goals: Exercise Program Goal: Individual exercise prescription set using results from initial 6 min walk test and THRR while considering  patient's activity barriers and safety.   Exercise Prescription Goal: Initial exercise prescription builds to 30-45 minutes a day of aerobic activity, 2-3 days per week.  Home exercise guidelines will be given to patient during program as part of exercise prescription that the participant will acknowledge.   Education: Aerobic Exercise: - Group verbal and visual presentation on the components of exercise prescription. Introduces F.I.T.T principle from ACSM for exercise prescriptions.  Reviews F.I.T.T. principles of aerobic exercise including progression. Written material given at graduation. Flowsheet Row Cardiac Rehab from 11/19/2022 in [facility] Cardiac and Pulmonary Rehab  Education need identified 11/19/22       Education: Resistance Exercise: - Group verbal and visual presentation on the components of exercise prescription. Introduces F.I.T.T principle from ACSM for exercise prescriptions  Reviews F.I.T.T. principles of resistance exercise including progression. Written material given at graduation.    Education: Exercise & Equipment Safety: - Individual verbal instruction and demonstration of equipment use and safety with use of the equipment. Flowsheet Row Cardiac Rehab from 11/19/2022 in [facility] Cardiac and Pulmonary Rehab  Date 11/19/22  Educator NT  Instruction Review Code 1- Verbalizes Understanding       Education: Exercise Physiology & General Exercise Guidelines: - Group verbal and written instruction with models to review the exercise physiology of the cardiovascular system and associated critical values. Provides general exercise guidelines with specific guidelines to those with heart or lung disease.  Flowsheet Row Cardiac Rehab  from 10/07/2019 in [facility] Cardiac and Pulmonary Rehab  Date 09/23/19  Educator AS  Instruction Review Code 1- Verbalizes Understanding       Education: Flexibility, Balance, Mind/Body Relaxation: - Group verbal and visual presentation with interactive activity on the components of exercise prescription. Introduces F.I.T.T principle from ACSM for exercise prescriptions. Reviews F.I.T.T. principles of flexibility and balance exercise training including progression. Also discusses the mind body connection.  Reviews various relaxation techniques to help reduce and manage stress (i.e. Deep breathing, progressive muscle relaxation, and visualization). Balance handout provided to take home. Written material given at graduation.   Activity Barriers & Risk Stratification:  Activity Barriers & Cardiac Risk Stratification - 05/20/23 1536       Activity Barriers & Cardiac Risk Stratification   Activity Barriers Right Knee Replacement;Left Knee Replacement;Muscular Weakness;Balance Concerns;Other (comment)    Comments drop foot, impaired gait, neuropathy    Cardiac Risk Stratification Moderate             6 Minute Walk:  6 Minute Walk     Row Name 05/20/23 1535         6 Minute Walk   Phase Initial     Distance 720 feet     Distance Feet Change 100 ft     Walk Time 6 minutes     # of Rest Breaks 0     MPH 1.36     METS 1.13     RPE 13     Perceived Dyspnea  1     VO2 Peak 3.96     Symptoms No     Resting HR 65 bpm     Resting Oxygen Saturation  95 %     Exercise Oxygen Saturation  during 6 min walk 95 %     Max Ex. HR 92 bpm     Max Ex. BP 122/64              Oxygen Initial Assessment:   Oxygen Re-Evaluation:   Oxygen Discharge (Final Oxygen Re-Evaluation):   Initial Exercise Prescription:  Initial Exercise Prescription - 05/20/23 1500       Date of Initial Exercise RX and Referring Provider   Date 05/20/23    Referring Provider Dr. Debborah Fairly      Oxygen    Maintain Oxygen Saturation 88% or higher      Treadmill   MPH 1      NuStep   Level 4   T6: 3   Minutes 15    METs 2.3      T5 Nustep   Level 4    Minutes 15    METs 3      Track   Laps 15    Minutes 15    METs 1.82      Resistance Training   Training Prescription Yes    Weight 4 lb    Reps 10-15             Perform Capillary Blood Glucose checks as needed.  Exercise Prescription Changes:   Exercise Prescription Changes     Row Name 05/28/23 1500 06/12/23 1500 06/27/23 1600 07/10/23 0800       Response to Exercise   Blood Pressure (Admit) 100/62 112/70 138/66 110/60    Blood Pressure (Exercise) -- 118/60 140/62 126/80    Blood Pressure (Exit) 112/64 98/54 118/60 110/58    Heart Rate (Admit) 65 bpm 78 bpm 63 bpm 77 bpm    Heart Rate (Exercise) 90 bpm 96 bpm 93 bpm 99 bpm    Heart Rate (Exit) 67 bpm 70 bpm 75 bpm 84 bpm    Rating of Perceived Exertion (Exercise) 13 15 13 17     Symptoms none none none none    Comments First two days back in rehab -- -- --    Duration Continue with 30 min of aerobic exercise without signs/symptoms of physical distress. Continue with 30 min of aerobic exercise without signs/symptoms of physical distress. Continue with 30 min of aerobic exercise without signs/symptoms of physical distress. Continue with 30 min of aerobic exercise without signs/symptoms of physical distress.    Intensity  THRR unchanged THRR unchanged THRR unchanged THRR unchanged      Progression   Progression Continue to progress workloads to maintain intensity without signs/symptoms of physical distress. Continue to progress workloads to maintain intensity without signs/symptoms of physical distress. Continue to progress workloads to maintain intensity without signs/symptoms of physical distress. Continue to progress workloads to maintain intensity without signs/symptoms of physical distress.    Average METs 2.27 2.09 2.14 1.61      Resistance Training    Training Prescription Yes Yes Yes Yes    Weight 3 lb 3 lb 3 lb 5 lb    Reps 10-15 10-15 10-15 10-15      Interval Training   Interval Training No No No No      NuStep   Level 5 5  T6: 3 4 3     Minutes 15 15 15 15     METs 2.4 3  T6: 2 2.4 1.5      Arm Ergometer   Level -- -- -- 1    Minutes -- -- -- 15    METs -- -- -- 1      T5 Nustep   Level -- -- -- 3  T6    Minutes -- -- -- 15    METs -- -- -- 1.7      Track   Laps 15 25 22 17     Minutes 15 15 15 15     METs 1.82 2.36 2.2 1.92      Oxygen   Maintain Oxygen Saturation 88% or higher 88% or higher 88% or higher 88% or higher             Exercise Comments:   Exercise Comments     Row Name 05/20/23 1537           Exercise Comments First full day of exercise!  Patient was oriented to gym and equipment including functions, settings, policies, and procedures.  Patient's individual exercise prescription and treatment plan were reviewed.  All starting workloads were established based on the results of the 6 minute walk test done at initial orientation visit.  The plan for exercise progression was also introduced and progression will be customized based on patient's performance and goals.                Exercise Goals and Review:   Exercise Goals     Row Name 05/20/23 1536             Exercise Goals   Increase Physical Activity Yes       Intervention Develop an individualized exercise prescription for aerobic and resistive training based on initial evaluation findings, risk stratification, comorbidities and participant's personal goals.;Provide advice, education, support and counseling about physical activity/exercise needs.       Expected Outcomes Long Term: Add in home exercise to make exercise part of routine and to increase amount of physical activity.;Long Term: Exercising regularly at least 3-5 days a week.;Short Term: Attend rehab on a regular basis to increase amount of physical activity.       Able  to understand and use rate of perceived exertion (RPE) scale Yes       Intervention Provide education and explanation on how to use RPE scale       Expected Outcomes Short Term: Able to use RPE daily in rehab to express subjective intensity level;Long Term:  Able to use RPE to guide intensity level when exercising independently       Able to understand  and use Dyspnea scale Yes       Intervention Provide education and explanation on how to use Dyspnea scale       Expected Outcomes Short Term: Able to use Dyspnea scale daily in rehab to express subjective sense of shortness of breath during exertion;Long Term: Able to use Dyspnea scale to guide intensity level when exercising independently       Knowledge and understanding of Target Heart Rate Range (THRR) Yes       Intervention Provide education and explanation of THRR including how the numbers were predicted and where they are located for reference       Expected Outcomes Short Term: Able to state/look up THRR;Short Term: Able to use daily as guideline for intensity in rehab;Long Term: Able to use THRR to govern intensity when exercising independently       Able to check pulse independently Yes       Intervention Provide education and demonstration on how to check pulse in carotid and radial arteries.;Review the importance of being able to check your own pulse for safety during independent exercise       Expected Outcomes Short Term: Able to explain why pulse checking is important during independent exercise;Long Term: Able to check pulse independently and accurately       Understanding of Exercise Prescription Yes       Intervention Provide education, explanation, and written materials on patient's individual exercise prescription       Expected Outcomes Short Term: Able to explain program exercise prescription;Long Term: Able to explain home exercise prescription to exercise independently                Exercise Goals Re-Evaluation :   Exercise Goals Re-Evaluation     Row Name 05/28/23 1518 06/12/23 1553 06/27/23 1633 07/10/23 0817       Exercise Goal Re-Evaluation   Exercise Goals Review Increase Physical Activity;Increase Strength and Stamina;Understanding of Exercise Prescription Increase Physical Activity;Increase Strength and Stamina;Understanding of Exercise Prescription Increase Physical Activity;Increase Strength and Stamina;Understanding of Exercise Prescription Increase Physical Activity;Increase Strength and Stamina;Understanding of Exercise Prescription    Comments Ed did well in his first two sessions back in rehab since returning with a new referral. He worked at level 5 on the T4 nustep and was able to walk 15 laps on the track. He also did well with 3 lb hand weights for resistance training. We will continue to monitor his progress in the program. Ed is doing well in rehab. He increased to 25 laps walked on the track and continues to use 3 lb hand weights for resistance training. He also has stayed consistent at level 3 on the T6 and level 5 on the T4 nustep. We will continue to monitor his progress in the program. Ed continues to do well in rehab. He was unable to make any workload increases during this review, but was however able to maintain his intensity on both the track and the T4 nustep. We will continue to monitor his progress in the program. Ed is doing well in rehab. He increased to 5lb handweights for resistance. He maintained level 3 on the T6 and level 1 on the arm ergometer. He decreased to level 3 on the T4 from level 4. He also decreased his laps to 17 on the track from 22. We will continue to monitor his progress in the program.    Expected Outcomes Short: Continue to follow current exercise prescription. Long: Continue exercise to improve strength  and stamina. Short: Continue to push for more laps on the track. Long: Continue exercise to improve strength and stamina. Short: Continue to push for more track  laps, increase to level 5 on the T4 nustep. Long: Continue exercise to improve strength and stamina. Short: Push for more laps on the track to get back to 22 laps and try level 4 on the T4 and T6 nusteps. Long: Continue exercise to improve strength and stamina.             Discharge Exercise Prescription (Final Exercise Prescription Changes):  Exercise Prescription Changes - 07/10/23 0800       Response to Exercise   Blood Pressure (Admit) 110/60    Blood Pressure (Exercise) 126/80    Blood Pressure (Exit) 110/58    Heart Rate (Admit) 77 bpm    Heart Rate (Exercise) 99 bpm    Heart Rate (Exit) 84 bpm    Rating of Perceived Exertion (Exercise) 17    Symptoms none    Duration Continue with 30 min of aerobic exercise without signs/symptoms of physical distress.    Intensity THRR unchanged      Progression   Progression Continue to progress workloads to maintain intensity without signs/symptoms of physical distress.    Average METs 1.61      Resistance Training   Training Prescription Yes    Weight 5 lb    Reps 10-15      Interval Training   Interval Training No      NuStep   Level 3    Minutes 15    METs 1.5      Arm Ergometer   Level 1    Minutes 15    METs 1      T5 Nustep   Level 3   T6   Minutes 15    METs 1.7      Track   Laps 17    Minutes 15    METs 1.92      Oxygen   Maintain Oxygen Saturation 88% or higher             Nutrition:  Target Goals: Understanding of nutrition guidelines, daily intake of sodium 1500mg , cholesterol 200mg , calories 30% from fat and 7% or less from saturated fats, daily to have 5 or more servings of fruits and vegetables.  Education: All About Nutrition: -Group instruction provided by verbal, written material, interactive activities, discussions, models, and posters to present general guidelines for heart healthy nutrition including fat, fiber, MyPlate, the role of sodium in heart healthy nutrition, utilization of  the nutrition label, and utilization of this knowledge for meal planning. Follow up email sent as well. Written material given at graduation. Flowsheet Row Cardiac Rehab from 11/19/2022 in [facility] Cardiac and Pulmonary Rehab  Education need identified 11/19/22       Biometrics:    Nutrition Therapy Plan and Nutrition Goals:   Nutrition Assessments:  MEDIFICTS Score Key: >=70 Need to make dietary changes  40-70 Heart Healthy Diet <= 40 Therapeutic Level Cholesterol Diet  Flowsheet Row Cardiac Rehab from 05/20/2023 in [facility] Cardiac and Pulmonary Rehab  Picture Your Plate Total Score on Admission 64      Picture Your Plate Scores: <13 Unhealthy dietary pattern with much room for improvement. 41-50 Dietary pattern unlikely to meet recommendations for good health and room for improvement. 51-60 More healthful dietary pattern, with some room for improvement.  >60 Healthy dietary pattern, although there may be some specific behaviors that could  be improved.    Nutrition Goals Re-Evaluation:  Nutrition Goals Re-Evaluation     Row Name 06/13/23 1603 07/11/23 1601           Goals   Comment Patient deferred RD appointment. Patient deferred RD appointment.               Nutrition Goals Discharge (Final Nutrition Goals Re-Evaluation):  Nutrition Goals Re-Evaluation - 07/11/23 1601       Goals   Comment Patient deferred RD appointment.             Psychosocial: Target Goals: Acknowledge presence or absence of significant depression and/or stress, maximize coping skills, provide positive support system. Participant is able to verbalize types and ability to use techniques and skills needed for reducing stress and depression.   Education: Stress, Anxiety, and Depression - Group verbal and visual presentation to define topics covered.  Reviews how body is impacted by stress, anxiety, and depression.  Also discusses healthy ways to reduce stress and to treat/manage anxiety  and depression.  Written material given at graduation.   Education: Sleep Hygiene -Provides group verbal and written instruction about how sleep can affect your health.  Define sleep hygiene, discuss sleep cycles and impact of sleep habits. Review good sleep hygiene tips.    Initial Review & Psychosocial Screening:  Initial Psych Review & Screening - 05/20/23 1520       Initial Review   Current issues with None Identified      Family Dynamics   Good Support System? Yes      Barriers   Psychosocial barriers to participate in program There are no identifiable barriers or psychosocial needs.      Screening Interventions   Interventions Encouraged to exercise;To provide support and resources with identified psychosocial needs;Provide feedback about the scores to participant    Expected Outcomes Short Term goal: Utilizing psychosocial counselor, staff and physician to assist with identification of specific Stressors or current issues interfering with healing process. Setting desired goal for each stressor or current issue identified.;Long Term Goal: Stressors or current issues are controlled or eliminated.;Short Term goal: Identification and review with participant of any Quality of Life or Depression concerns found by scoring the questionnaire.;Long Term goal: The participant improves quality of Life and PHQ9 Scores as seen by post scores and/or verbalization of changes             Quality of Life Scores:   Quality of Life - 05/20/23 1523       Quality of Life   Select Quality of Life      Quality of Life Scores   Health/Function Pre 27.6 %    Socioeconomic Pre 28 %    Psych/Spiritual Pre 30 %    Family Pre 27 %    GLOBAL Pre 28.13 %            Scores of 19 and below usually indicate a poorer quality of life in these areas.  A difference of  2-3 points is a clinically meaningful difference.  A difference of 2-3 points in the total score of the Quality of Life Index has  been associated with significant improvement in overall quality of life, self-image, physical symptoms, and general health in studies assessing change in quality of life.  PHQ-9: Review Flowsheet  More data exists      06/13/2023 05/16/2023 11/19/2022 01/12/2020 10/28/2019  Depression screen PHQ 2/9  Decreased Interest 0 0 1 3 2   Down, Depressed, Hopeless 0  0 1 0 0  PHQ - 2 Score 0 0 2 3 2   Altered sleeping 0 0 1 1 3   Tired, decreased energy 1 3 1 2 2   Change in appetite 1 3 1 1 1   Feeling bad or failure about yourself  0 0 0 0 0  Trouble concentrating 0 0 0 0 1  Moving slowly or fidgety/restless 0 0 0 0 1  Suicidal thoughts 0 0 0 1 0  PHQ-9 Score 2 6 5 8 10   Difficult doing work/chores Not difficult at all Not difficult at all Somewhat difficult Not difficult at all Somewhat difficult   Interpretation of Total Score  Total Score Depression Severity:  1-4 = Minimal depression, 5-9 = Mild depression, 10-14 = Moderate depression, 15-19 = Moderately severe depression, 20-27 = Severe depression   Psychosocial Evaluation and Intervention:  Psychosocial Evaluation - 05/20/23 1520       Psychosocial Evaluation & Interventions   Interventions Encouraged to exercise with the program and follow exercise prescription    Comments Ed is returning to the program after another stent placement. He is motivated to attend the program and has no barriers to attending. He has completed PT for balance and plans to increase his stamina in the program. He relies on his lady friend for support and company    Expected Outcomes Short: attend cardiac rehab for education and exercise Long: develop and maintain positive self care habits    Continue Psychosocial Services  Follow up required by staff             Psychosocial Re-Evaluation:  Psychosocial Re-Evaluation     Row Name 06/13/23 1557 07/11/23 1549           Psychosocial Re-Evaluation   Current issues with Current Psychotropic Meds Current  Psychotropic Meds      Comments Reviewed patient health questionnaire (PHQ-9) with patient for follow up. Previously, patients score indicated signs/symptoms of depression.  Reviewed to see if patient is improving symptom wise while in program.  Score improved and patient states that it is because he is able to exercise and have more energy. Ed states that he is still taking his anxiety/depression medication. He also states that he is sleeping well, and is not having any stressors at this time. He can look to his "lady friend" when he is having stress, as well as exercise.      Expected Outcomes Short: Continue to attend [facility] regularly for regular exercise and social engagement. Long: Continue to improve symptoms and manage a positive mental state. Short: Continue to attend [facility] regularly for regular exercise and social engagement. Long: Continue to improve symptoms and manage a positive mental state.      Interventions Encouraged to attend Cardiac Rehabilitation for the exercise Encouraged to attend Cardiac Rehabilitation for the exercise      Continue Psychosocial Services  Follow up required by staff Follow up required by staff               Psychosocial Discharge (Final Psychosocial Re-Evaluation):  Psychosocial Re-Evaluation - 07/11/23 1549       Psychosocial Re-Evaluation   Current issues with Current Psychotropic Meds    Comments Ed states that he is still taking his anxiety/depression medication. He also states that he is sleeping well, and is not having any stressors at this time. He can look to his "lady friend" when he is having stress, as well as exercise.    Expected Outcomes Short: Continue to attend  [facility] regularly for regular exercise and social engagement. Long: Continue to improve symptoms and manage a positive mental state.    Interventions Encouraged to attend Cardiac Rehabilitation for the exercise    Continue Psychosocial Services  Follow up required by  staff             Vocational Rehabilitation: Provide vocational rehab assistance to qualifying candidates.   Vocational Rehab Evaluation & Intervention:  Vocational Rehab - 05/20/23 1519       Initial Vocational Rehab Evaluation & Intervention   Assessment shows need for Vocational Rehabilitation No             Education: Education Goals: Education classes will be provided on a variety of topics geared toward better understanding of heart health and risk factor modification. Participant will state understanding/return demonstration of topics presented as noted by education test scores.  Learning Barriers/Preferences:   General Cardiac Education Topics:  AED/CPR: - Group verbal and written instruction with the use of models to demonstrate the basic use of the AED with the basic ABC's of resuscitation.   Anatomy and Cardiac Procedures: - Group verbal and visual presentation and models provide information about basic cardiac anatomy and function. Reviews the testing methods done to diagnose heart disease and the outcomes of the test results. Describes the treatment choices: Medical Management, Angioplasty, or Coronary Bypass Surgery for treating various heart conditions including Myocardial Infarction, Angina, Valve Disease, and Cardiac Arrhythmias.  Written material given at graduation. Flowsheet Row Cardiac Rehab from 11/19/2022 in [facility] Cardiac and Pulmonary Rehab  Education need identified 11/19/22       Medication Safety: - Group verbal and visual instruction to review commonly prescribed medications for heart and lung disease. Reviews the medication, class of the drug, and side effects. Includes the steps to properly store meds and maintain the prescription regimen.  Written material given at graduation. Flowsheet Row Cardiac Rehab from 10/07/2019 in [facility] Cardiac and Pulmonary Rehab  Date 10/07/19  Educator SB  Instruction Review Code 1- Verbalizes Understanding        Intimacy: - Group verbal instruction through game format to discuss how heart and lung disease can affect sexual intimacy. Written material given at graduation..   Know Your Numbers and Heart Failure: - Group verbal and visual instruction to discuss disease risk factors for cardiac and pulmonary disease and treatment options.  Reviews associated critical values for Overweight/Obesity, Hypertension, Cholesterol, and Diabetes.  Discusses basics of heart failure: signs/symptoms and treatments.  Introduces Heart Failure Zone chart for action plan for heart failure.  Written material given at graduation.   Infection Prevention: - Provides verbal and written material to individual with discussion of infection control including proper hand washing and proper equipment cleaning during exercise session. Flowsheet Row Cardiac Rehab from 11/19/2022 in [facility] Cardiac and Pulmonary Rehab  Date 11/19/22  Educator NT  Instruction Review Code 1- Verbalizes Understanding       Falls Prevention: - Provides verbal and written material to individual with discussion of falls prevention and safety. Flowsheet Row Cardiac Rehab from 11/19/2022 in [facility] Cardiac and Pulmonary Rehab  Date 11/19/22  Educator NT  Instruction Review Code 1- Verbalizes Understanding       Other: -Provides group and verbal instruction on various topics (see comments)   Knowledge Questionnaire Score:  Knowledge Questionnaire Score - 05/20/23 1519       Knowledge Questionnaire Score   Pre Score 24/26  Core Components/Risk Factors/Patient Goals at Admission:  Personal Goals and Risk Factors at Admission - 05/20/23 1520       Core Components/Risk Factors/Patient Goals on Admission    Weight Management Yes;Weight Maintenance    Intervention Weight Management: Develop a combined nutrition and exercise program designed to reach desired caloric intake, while maintaining appropriate intake of nutrient and fiber,  sodium and fats, and appropriate energy expenditure required for the weight goal.;Weight Management: Provide education and appropriate resources to help participant work on and attain dietary goals.    Expected Outcomes Short Term: Continue to assess and modify interventions until short term weight is achieved;Long Term: Adherence to nutrition and physical activity/exercise program aimed toward attainment of established weight goal;Weight Maintenance: Understanding of the daily nutrition guidelines, which includes 25-35% calories from fat, 7% or less cal from saturated fats, less than 200mg  cholesterol, less than 1.5gm of sodium, & 5 or more servings of fruits and vegetables daily    Diabetes Yes    Intervention Provide education about signs/symptoms and action to take for hypo/hyperglycemia.;Provide education about proper nutrition, including hydration, and aerobic/resistive exercise prescription along with prescribed medications to achieve blood glucose in normal ranges: Fasting glucose 65-99 mg/dL    Expected Outcomes Short Term: Participant verbalizes understanding of the signs/symptoms and immediate care of hyper/hypoglycemia, proper foot care and importance of medication, aerobic/resistive exercise and nutrition plan for blood glucose control.;Long Term: Attainment of HbA1C < 7%.    Heart Failure Yes    Intervention Provide a combined exercise and nutrition program that is supplemented with education, support and counseling about heart failure. Directed toward relieving symptoms such as shortness of breath, decreased exercise tolerance, and extremity edema.    Expected Outcomes Improve functional capacity of life;Short term: Attendance in program 2-3 days a week with increased exercise capacity. Reported lower sodium intake. Reported increased fruit and vegetable intake. Reports medication compliance.;Short term: Daily weights obtained and reported for increase. Utilizing diuretic protocols set by  physician.;Long term: Adoption of self-care skills and reduction of barriers for early signs and symptoms recognition and intervention leading to self-care maintenance.    Hypertension Yes    Intervention Provide education on lifestyle modifcations including regular physical activity/exercise, weight management, moderate sodium restriction and increased consumption of fresh fruit, vegetables, and low fat dairy, alcohol moderation, and smoking cessation.;Monitor prescription use compliance.    Expected Outcomes Short Term: Continued assessment and intervention until BP is < 140/66mm HG in hypertensive participants. < 130/60mm HG in hypertensive participants with diabetes, heart failure or chronic kidney disease.;Long Term: Maintenance of blood pressure at goal levels.    Lipids Yes    Intervention Provide education and support for participant on nutrition & aerobic/resistive exercise along with prescribed medications to achieve LDL 70mg , HDL >40mg .    Expected Outcomes Short Term: Participant states understanding of desired cholesterol values and is compliant with medications prescribed. Participant is following exercise prescription and nutrition guidelines.;Long Term: Cholesterol controlled with medications as prescribed, with individualized exercise RX and with personalized nutrition plan. Value goals: LDL < 70mg , HDL > 40 mg.             Education:Diabetes - Individual verbal and written instruction to review signs/symptoms of diabetes, desired ranges of glucose level fasting, after meals and with exercise. Acknowledge that pre and post exercise glucose checks will be done for 3 sessions at entry of program. Flowsheet Row Cardiac Rehab from 01/13/2020 in [facility] Cardiac and Pulmonary Rehab  Date 01/13/20  Educator [facility]  Instruction Review Code 1- Verbalizes Understanding       Core Components/Risk Factors/Patient Goals Review:   Goals and Risk Factor Review     Row Name 06/13/23 1551  07/11/23 1603           Core Components/Risk Factors/Patient Goals Review   Personal Goals Review Weight Management/Obesity Weight Management/Obesity      Review Ed is continuing the program and is working toward losing weight. He is 224 pounds currently. and wants to reach a weight goal of 200 pounds. He is going to work toward losing a few pounds. Ed continues to try to lose some weight. He currently weighed in at 219.7lbs today and would like to get down to a target weight of 200lbs.      Expected Outcomes Short: lose some weight. Long: reach weight goal. Short: Lose 5 lbs. Long: Continue to exercise and diet in order to lose some weight.               Core Components/Risk Factors/Patient Goals at Discharge (Final Review):   Goals and Risk Factor Review - 07/11/23 1603       Core Components/Risk Factors/Patient Goals Review   Personal Goals Review Weight Management/Obesity    Review Ed continues to try to lose some weight. He currently weighed in at 219.7lbs today and would like to get down to a target weight of 200lbs.    Expected Outcomes Short: Lose 5 lbs. Long: Continue to exercise and diet in order to lose some weight.             ITP Comments:  ITP Comments     Row Name 05/20/23 1537 05/22/23 0754 06/19/23 1206 07/17/23 1343     ITP Comments First full day of exercise!  Patient was oriented to gym and equipment including functions, settings, policies, and procedures.  Patient's individual exercise prescription and treatment plan were reviewed.  All starting workloads were established based on the results of the 6 minute walk test done at initial orientation visit.  The plan for exercise progression was also introduced and progression will be customized based on patient's performance and goals. 30 Day review completed. Medical Director ITP review done, changes made as directed, and signed approval by Medical Director. New patient new diagnosis. 30 Day review completed.  Medical Director ITP review done, changes made as directed, and signed approval by Medical Director. 30 Day review completed. Medical Director ITP review done, changes made as directed, and signed approval by Medical Director.             Comments:

## 2023-07-18 ENCOUNTER — Encounter: Admitting: *Deleted

## 2023-07-18 DIAGNOSIS — Z955 Presence of coronary angioplasty implant and graft: Secondary | ICD-10-CM

## 2023-07-18 NOTE — Progress Notes (Signed)
 Daily Session Note  Patient Details  Name: Angelia Mordhorst [facility]. MRN: [id number] Date of Birth: 1945/09/02 Referring Provider:   Flowsheet Row Cardiac Rehab from 05/20/2023 in [facility] Cardiac and Pulmonary Rehab  Referring Provider Dr. Debborah Fairly       Encounter Date: 07/18/2023  Check In:  Session Check In - 07/18/23 1533       Check-In   Supervising physician immediately available to respond to emergencies See telemetry face sheet for immediately available ER MD    Location [facility]-Cardiac & Pulmonary Rehab    Staff Present Sue Em RN,BSN;Joseph [facility] BS, Exercise Physiologist;Noah Tickle, BS, Exercise Physiologist    Virtual Visit No    Medication changes reported     No    Fall or balance concerns reported    No    Warm-up and Cool-down Performed on first and last piece of equipment    Resistance Training Performed Yes    VAD Patient? No    PAD/SET Patient? No      Pain Assessment   Currently in Pain? No/denies                Social History   Tobacco Use  Smoking Status Former  Smokeless Tobacco Never  Tobacco Comments   Quit over 40 years ago    Goals Met:  Independence with exercise equipment Exercise tolerated well No report of concerns or symptoms today Strength training completed today  Goals Unmet:  Not Applicable  Comments: Pt able to follow exercise prescription today without complaint.  Will continue to monitor for progression.    Dr. Firman Hughes is Medical Director for [facility] Cardiac Rehabilitation.  Dr. Fuad Aleskerov is Medical Director for [facility] Pulmonary Rehabilitation.

## 2023-07-22 ENCOUNTER — Encounter: Admitting: *Deleted

## 2023-07-22 DIAGNOSIS — I214 Non-ST elevation (NSTEMI) myocardial infarction: Secondary | ICD-10-CM

## 2023-07-22 DIAGNOSIS — Z955 Presence of coronary angioplasty implant and graft: Secondary | ICD-10-CM | POA: Diagnosis not present

## 2023-07-22 NOTE — Progress Notes (Signed)
 Daily Session Note  Patient Details  Name: Devin [name]. MRN: [id number] Date of Birth: January 14, 1946 Referring Provider:   Flowsheet Row Cardiac Rehab from 05/20/2023 in [facility] Cardiac and Pulmonary Rehab  Referring Provider Dr. Debborah Fairly       Encounter Date: 07/22/2023  Check In:  Session Check In - 07/22/23 1537       Check-In   Supervising physician immediately available to respond to emergencies See telemetry face sheet for immediately available ER MD    Location [facility]-Cardiac & Pulmonary Rehab    Staff Present Sue Em RN,BSN;Margaret Best, MS, Exercise Physiologist;Kelly Dawne Euler, ACSM CEP, Exercise Physiologist;Noah Tickle, BS, Exercise Physiologist    Virtual Visit No    Medication changes reported     No    Fall or balance concerns reported    No    Warm-up and Cool-down Performed on first and last piece of equipment    Resistance Training Performed Yes    VAD Patient? No    PAD/SET Patient? No      Pain Assessment   Currently in Pain? No/denies                Social History   Tobacco Use  Smoking Status Former  Smokeless Tobacco Never  Tobacco Comments   Quit over 40 years ago    Goals Met:  Independence with exercise equipment Exercise tolerated well No report of concerns or symptoms today Strength training completed today  Goals Unmet:  Not Applicable  Comments: Pt able to follow exercise prescription today without complaint.  Will continue to monitor for progression.    Dr. Firman Hughes is Medical Director for [facility] Cardiac Rehabilitation.  Dr. Fuad Aleskerov is Medical Director for [facility] Pulmonary Rehabilitation.

## 2023-07-25 ENCOUNTER — Encounter

## 2023-07-29 ENCOUNTER — Encounter: Admitting: *Deleted

## 2023-07-29 DIAGNOSIS — Z955 Presence of coronary angioplasty implant and graft: Secondary | ICD-10-CM

## 2023-07-29 NOTE — Progress Notes (Signed)
 Daily Session Note  Patient Details  Name: Haji Delaine [facility]. MRN: [id number] Date of Birth: 03-06-1946 Referring Provider:   Flowsheet Row Cardiac Rehab from 05/20/2023 in [facility] Cardiac and Pulmonary Rehab  Referring Provider Dr. Debborah Fairly       Encounter Date: 07/29/2023  Check In:  Session Check In - 07/29/23 1529       Check-In   Supervising physician immediately available to respond to emergencies See telemetry face sheet for immediately available ER MD    Location [facility]-Cardiac & Pulmonary Rehab    Staff Present Sue Em RN,BSN;Margaret Best, MS, Exercise Physiologist;Maxon Conetta BS, Exercise Physiologist;Noah Tickle, BS, Exercise Physiologist    Virtual Visit No    Medication changes reported     No    Fall or balance concerns reported    No    Warm-up and Cool-down Performed on first and last piece of equipment    Resistance Training Performed Yes    VAD Patient? No    PAD/SET Patient? No      Pain Assessment   Currently in Pain? No/denies                Social History   Tobacco Use  Smoking Status Former  Smokeless Tobacco Never  Tobacco Comments   Quit over 40 years ago    Goals Met:  Independence with exercise equipment Exercise tolerated well No report of concerns or symptoms today Strength training completed today  Goals Unmet:  Not Applicable  Comments: Pt able to follow exercise prescription today without complaint.  Will continue to monitor for progression.    Dr. Firman Hughes is Medical Director for [facility] Cardiac Rehabilitation.  Dr. Fuad Aleskerov is Medical Director for [facility] Pulmonary Rehabilitation.

## 2023-07-30 ENCOUNTER — Other Ambulatory Visit: Payer: Self-pay | Admitting: Cardiovascular Disease

## 2023-07-30 ENCOUNTER — Encounter (INDEPENDENT_AMBULATORY_CARE_PROVIDER_SITE_OTHER): Payer: Self-pay

## 2023-07-30 DIAGNOSIS — R0789 Other chest pain: Secondary | ICD-10-CM

## 2023-07-30 DIAGNOSIS — R0602 Shortness of breath: Secondary | ICD-10-CM

## 2023-07-30 DIAGNOSIS — I2510 Atherosclerotic heart disease of native coronary artery without angina pectoris: Secondary | ICD-10-CM

## 2023-08-01 ENCOUNTER — Encounter: Admitting: *Deleted

## 2023-08-01 DIAGNOSIS — Z955 Presence of coronary angioplasty implant and graft: Secondary | ICD-10-CM

## 2023-08-01 NOTE — Progress Notes (Signed)
 Daily Session Note  Patient Details  Name: Devin [name] [facility]. MRN: [id number] Date of Birth: 1945/04/12 Referring Provider:   Flowsheet Row Cardiac Rehab from 05/20/2023 in [facility] Cardiac and Pulmonary Rehab  Referring Provider Dr. Debborah Fairly       Encounter Date: 08/01/2023  Check In:  Session Check In - 08/01/23 1530       Check-In   Supervising physician immediately available to respond to emergencies See telemetry face sheet for immediately available ER MD    Location [facility]-Cardiac & Pulmonary Rehab    Staff Present Sue Em RN,BSN;Maxon Conetta BS, Exercise Physiologist;Joseph Lacinda Pica RCP,RRT,BSRT    Virtual Visit No    Medication changes reported     No    Fall or balance concerns reported    No    Warm-up and Cool-down Performed on first and last piece of equipment    Resistance Training Performed Yes    VAD Patient? No    PAD/SET Patient? No      Pain Assessment   Currently in Pain? No/denies                Social History   Tobacco Use  Smoking Status Former  Smokeless Tobacco Never  Tobacco Comments   Quit over 40 years ago    Goals Met:  Independence with exercise equipment Exercise tolerated well No report of concerns or symptoms today Strength training completed today  Goals Unmet:  Not Applicable  Comments: Pt able to follow exercise prescription today without complaint.  Will continue to monitor for progression.    Dr. Firman Hughes is Medical Director for [facility] Cardiac Rehabilitation.  Dr. Fuad Aleskerov is Medical Director for [facility] Pulmonary Rehabilitation.

## 2023-08-08 ENCOUNTER — Encounter

## 2023-08-12 ENCOUNTER — Encounter

## 2023-08-14 ENCOUNTER — Encounter: Payer: Self-pay | Admitting: *Deleted

## 2023-08-14 DIAGNOSIS — Z955 Presence of coronary angioplasty implant and graft: Secondary | ICD-10-CM

## 2023-08-14 NOTE — Progress Notes (Signed)
 Cardiac Individual Treatment Plan  Patient Details  Name: Devin [name] [facility] MRN: [id number] Date of Birth: [birth year] Referring Provider:   Flowsheet Row Cardiac Rehab from 05/20/2023 in [facility] Cardiac and Pulmonary Rehab  Referring Provider Dr. Debborah Fairly       Initial Encounter Date:  Flowsheet Row Cardiac Rehab from 05/20/2023 in [facility] Cardiac and Pulmonary Rehab  Date 05/20/23       Visit Diagnosis: Status post coronary artery stent placement  Patient's Home Medications on Admission:  Current Outpatient Medications:    albuterol  (VENTOLIN  HFA) 108 (90 Base) MCG/ACT inhaler, Inhale 2 puffs into the lungs every 6 (six) hours as needed for wheezing or shortness of breath., Disp: , Rfl:    amiodarone  (PACERONE ) 200 MG tablet, Take 1 tablet (200 mg total) by mouth daily., Disp: 30 tablet, Rfl: 2   atorvastatin  (LIPITOR ) 80 MG tablet, Take 80 mg by mouth daily., Disp: , Rfl:    buPROPion  (WELLBUTRIN  XL) 300 MG 24 hr tablet, Take 300 mg by mouth daily., Disp: , Rfl:    clopidogrel  (PLAVIX ) 75 MG tablet, Take 1 tablet (75 mg total) by mouth daily with breakfast., Disp: 30 tablet, Rfl: 11   docusate (COLACE) 60 MG/15ML syrup, Take 60 mg by mouth daily., Disp: , Rfl:    ELIQUIS  5 MG TABS tablet, TAKE 1 TABLET BY MOUTH TWICE A DAY, Disp: 60 tablet, Rfl: 3   EPINEPHrine  0.3 mg/0.3 mL IJ SOAJ injection, Inject 0.3 mg into the muscle as needed for anaphylaxis., Disp: , Rfl:    ezetimibe  (ZETIA ) 10 MG tablet, Take 10 mg by mouth daily., Disp: , Rfl:    fluticasone  (FLONASE ) 50 MCG/ACT nasal spray, SPRAY 2 SPRAYS INTO EACH NOSTRIL EVERY DAY, Disp: , Rfl:    gabapentin  (NEURONTIN ) 300 MG capsule, Take 300 mg by mouth 2 (two) times daily., Disp: , Rfl:    loratadine  (CLARITIN ) 10 MG tablet, Take 10 mg by mouth daily., Disp: , Rfl:    metFORMIN  (GLUCOPHAGE ) 500 MG tablet, Take 1,000 mg by mouth 2 (two) times daily., Disp: , Rfl:    metoprolol  succinate (TOPROL  XL) 50 MG 24 hr tablet, Take 1  tablet (50 mg total) by mouth daily. Take with or immediately following a meal., Disp: 30 tablet, Rfl: 11   nitroGLYCERIN  (NITROSTAT ) 0.4 MG SL tablet, Place 0.4 mg under the tongue every 5 (five) minutes as needed for chest pain., Disp: , Rfl:    pantoprazole  (PROTONIX ) 40 MG tablet, TAKE 1 TABLET BY MOUTH EVERY DAY, Disp: 30 tablet, Rfl: 1   ranolazine  (RANEXA ) 500 MG 12 hr tablet, TAKE 1 TABLET BY MOUTH TWICE A DAY, Disp: 180 tablet, Rfl: 1   sacubitril -valsartan  (ENTRESTO ) 97-103 MG, TAKE 1 TABLET BY MOUTH TWICE A DAY, Disp: 180 tablet, Rfl: 1  Past Medical History: 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   Hypertension    Ischemic cardiomyopathy    MI, old    Peripheral neuropathy    PFO (patent foramen ovale) 05/2019   s/p repair   Sleep apnea  Spinal stenosis of lumbar region    TIA (transient ischemic attack)     Tobacco Use: Social History   Tobacco Use  Smoking Status Former  Smokeless Tobacco Never  Tobacco Comments   Quit over 40 years ago    Labs: Review Flowsheet  More data exists      Latest Ref Rng & Units 04/08/2020 07/04/2020 04/12/2022 04/13/2022 10/13/2022  Labs for ITP Cardiac and Pulmonary Rehab  Cholestrol 0 - 200 mg/dL - - - - 161   LDL (calc) 0 - 99 mg/dL - - - - 35   HDL-C >09 mg/dL - - - - 59   Trlycerides <150 mg/dL - - - - 55   Hemoglobin A1c 4.8 - 5.6 % 5.8  - - 5.5  6.3   Bicarbonate 20.0 - 28.0 mmol/L - - 23.9  - -  TCO2 22 - 32 mmol/L - 24  - - -  Acid-base deficit 0.0 - 2.0 mmol/L - - 0.1  - -  O2 Saturation % - - 90.3  - -     Exercise Target Goals: Exercise Program Goal: Individual  exercise prescription set using results from initial 6 min walk test and THRR while considering  patient's activity barriers and safety.   Exercise Prescription Goal: Initial exercise prescription builds to 30-45 minutes a day of aerobic activity, 2-3 days per week.  Home exercise guidelines will be given to patient during program as part of exercise prescription that the participant will acknowledge.   Education: Aerobic Exercise: - Group verbal and visual presentation on the components of exercise prescription. Introduces F.I.T.T principle from ACSM for exercise prescriptions.  Reviews F.I.T.T. principles of aerobic exercise including progression. Written material given at graduation. Flowsheet Row Cardiac Rehab from 11/19/2022 in [facility] Cardiac and Pulmonary Rehab  Education need identified 11/19/22       Education: Resistance Exercise: - Group verbal and visual presentation on the components of exercise prescription. Introduces F.I.T.T principle from ACSM for exercise prescriptions  Reviews F.I.T.T. principles of resistance exercise including progression. Written material given at graduation.    Education: Exercise & Equipment Safety: - Individual verbal instruction and demonstration of equipment use and safety with use of the equipment. Flowsheet Row Cardiac Rehab from 11/19/2022 in [facility] Cardiac and Pulmonary Rehab  Date 11/19/22  Educator NT  Instruction Review Code 1- Verbalizes Understanding       Education: Exercise Physiology & General Exercise Guidelines: - Group verbal and written instruction with models to review the exercise physiology of the cardiovascular system and associated critical values. Provides general exercise guidelines with specific guidelines to those with heart or lung disease.  Flowsheet Row Cardiac Rehab from 10/07/2019 in [facility] Cardiac and Pulmonary Rehab  Date 09/23/19  Educator AS  Instruction Review Code 1- Verbalizes Understanding       Education:  Flexibility, Balance, Mind/Body Relaxation: - Group verbal and visual presentation with interactive activity on the components of exercise prescription. Introduces F.I.T.T principle from ACSM for exercise prescriptions. Reviews F.I.T.T. principles of flexibility and balance exercise training including progression. Also discusses the mind body connection.  Reviews various relaxation techniques to help reduce and manage stress (i.e. Deep breathing, progressive muscle relaxation, and visualization). Balance handout provided to take home. Written material given at graduation.   Activity Barriers & Risk Stratification:  Activity Barriers & Cardiac Risk Stratification - 05/20/23 1536       Activity Barriers & Cardiac Risk Stratification   Activity Barriers Right Knee Replacement;Left Knee Replacement;Muscular Weakness;Balance  Concerns;Other (comment)    Comments drop foot, impaired gait, neuropathy    Cardiac Risk Stratification Moderate             6 Minute Walk:  6 Minute Walk     Row Name 05/20/23 1535         6 Minute Walk   Phase Initial     Distance 720 feet     Distance Feet Change 100 ft     Walk Time 6 minutes     # of Rest Breaks 0     MPH 1.36     METS 1.13     RPE 13     Perceived Dyspnea  1     VO2 Peak 3.96     Symptoms No     Resting HR 65 bpm     Resting Oxygen Saturation  95 %     Exercise Oxygen Saturation  during 6 min walk 95 %     Max Ex. HR 92 bpm     Max Ex. BP 122/64              Oxygen Initial Assessment:   Oxygen Re-Evaluation:   Oxygen Discharge (Final Oxygen Re-Evaluation):   Initial Exercise Prescription:  Initial Exercise Prescription - 05/20/23 1500       Date of Initial Exercise RX and Referring Provider   Date 05/20/23    Referring Provider Dr. Debborah Fairly      Oxygen   Maintain Oxygen Saturation 88% or higher      Treadmill   MPH 1      NuStep   Level 4   T6: 3   Minutes 15    METs 2.3      T5 Nustep   Level 4     Minutes 15    METs 3      Track   Laps 15    Minutes 15    METs 1.82      Resistance Training   Training Prescription Yes    Weight 4 lb    Reps 10-15             Perform Capillary Blood Glucose checks as needed.  Exercise Prescription Changes:   Exercise Prescription Changes     Row Name 05/28/23 1500 06/12/23 1500 06/27/23 1600 07/10/23 0800 07/22/23 1600     Response to Exercise   Blood Pressure (Admit) 100/62 112/70 138/66 110/60 142/80   Blood Pressure (Exercise) -- 118/60 140/62 126/80 --   Blood Pressure (Exit) 112/64 98/54 118/60 110/58 132/70   Heart Rate (Admit) 65 bpm 78 bpm 63 bpm 77 bpm 77 bpm   Heart Rate (Exercise) 90 bpm 96 bpm 93 bpm 99 bpm 106 bpm   Heart Rate (Exit) 67 bpm 70 bpm 75 bpm 84 bpm 82 bpm   Rating of Perceived Exertion (Exercise) 13 15 13 17 15    Symptoms none none none none none   Comments First two days back in rehab -- -- -- --   Duration Continue with 30 min of aerobic exercise without signs/symptoms of physical distress. Continue with 30 min of aerobic exercise without signs/symptoms of physical distress. Continue with 30 min of aerobic exercise without signs/symptoms of physical distress. Continue with 30 min of aerobic exercise without signs/symptoms of physical distress. Continue with 30 min of aerobic exercise without signs/symptoms of physical distress.   Intensity THRR unchanged THRR unchanged THRR unchanged THRR unchanged THRR unchanged     Progression  Progression Continue to progress workloads to maintain intensity without signs/symptoms of physical distress. Continue to progress workloads to maintain intensity without signs/symptoms of physical distress. Continue to progress workloads to maintain intensity without signs/symptoms of physical distress. Continue to progress workloads to maintain intensity without signs/symptoms of physical distress. Continue to progress workloads to maintain intensity without signs/symptoms of  physical distress.   Average METs 2.27 2.09 2.14 1.61 1.82     Resistance Training   Training Prescription Yes Yes Yes Yes Yes   Weight 3 lb 3 lb 3 lb 5 lb 5 lb   Reps 10-15 10-15 10-15 10-15 10-15     Interval Training   Interval Training No No No No No     NuStep   Level 5 5  T6: 3 4 3 3   T6: level 3   Minutes 15 15 15 15 15    METs 2.4 3  T6: 2 2.4 1.5 1.7  T6: 1.9 METs     Arm Ergometer   Level -- -- -- 1 --   Minutes -- -- -- 15 --   METs -- -- -- 1 --     T5 Nustep   Level -- -- -- 3  T6 --   Minutes -- -- -- 15 --   METs -- -- -- 1.7 --     Track   Laps 15 25 22 17 17    Minutes 15 15 15 15 15    METs 1.82 2.36 2.2 1.92 1.92     Oxygen   Maintain Oxygen Saturation 88% or higher 88% or higher 88% or higher 88% or higher 88% or higher    Row Name 08/08/23 1700             Response to Exercise   Blood Pressure (Admit) 112/70       Blood Pressure (Exit) 118/70       Heart Rate (Admit) 75 bpm       Heart Rate (Exercise) 100 bpm       Heart Rate (Exit) 79 bpm       Rating of Perceived Exertion (Exercise) 14       Symptoms none       Duration Continue with 30 min of aerobic exercise without signs/symptoms of physical distress.       Intensity THRR unchanged         Progression   Progression Continue to progress workloads to maintain intensity without signs/symptoms of physical distress.       Average METs 1.78         Resistance Training   Training Prescription Yes       Weight 5 lb       Reps 10-15         Interval Training   Interval Training No         NuStep   Level 3       Minutes 15       METs 2         T5 Nustep   Level 1  T6       Minutes 15       METs 1.5         Track   Laps 17       Minutes 15       METs 1.92         Oxygen   Maintain Oxygen Saturation 88% or higher  Exercise Comments:   Exercise Comments     Row Name 05/20/23 1537           Exercise Comments First full day of exercise!  Patient was  oriented to gym and equipment including functions, settings, policies, and procedures.  Patient's individual exercise prescription and treatment plan were reviewed.  All starting workloads were established based on the results of the 6 minute walk test done at initial orientation visit.  The plan for exercise progression was also introduced and progression will be customized based on patient's performance and goals.                Exercise Goals and Review:   Exercise Goals     Row Name 05/20/23 1536             Exercise Goals   Increase Physical Activity Yes       Intervention Develop an individualized exercise prescription for aerobic and resistive training based on initial evaluation findings, risk stratification, comorbidities and participant's personal goals.;Provide advice, education, support and counseling about physical activity/exercise needs.       Expected Outcomes Long Term: Add in home exercise to make exercise part of routine and to increase amount of physical activity.;Long Term: Exercising regularly at least 3-5 days a week.;Short Term: Attend rehab on a regular basis to increase amount of physical activity.       Able to understand and use rate of perceived exertion (RPE) scale Yes       Intervention Provide education and explanation on how to use RPE scale       Expected Outcomes Short Term: Able to use RPE daily in rehab to express subjective intensity level;Long Term:  Able to use RPE to guide intensity level when exercising independently       Able to understand and use Dyspnea scale Yes       Intervention Provide education and explanation on how to use Dyspnea scale       Expected Outcomes Short Term: Able to use Dyspnea scale daily in rehab to express subjective sense of shortness of breath during exertion;Long Term: Able to use Dyspnea scale to guide intensity level when exercising independently       Knowledge and understanding of Target Heart Rate Range (THRR)  Yes       Intervention Provide education and explanation of THRR including how the numbers were predicted and where they are located for reference       Expected Outcomes Short Term: Able to state/look up THRR;Short Term: Able to use daily as guideline for intensity in rehab;Long Term: Able to use THRR to govern intensity when exercising independently       Able to check pulse independently Yes       Intervention Provide education and demonstration on how to check pulse in carotid and radial arteries.;Review the importance of being able to check your own pulse for safety during independent exercise       Expected Outcomes Short Term: Able to explain why pulse checking is important during independent exercise;Long Term: Able to check pulse independently and accurately       Understanding of Exercise Prescription Yes       Intervention Provide education, explanation, and written materials on patient's individual exercise prescription       Expected Outcomes Short Term: Able to explain program exercise prescription;Long Term: Able to explain home exercise prescription to exercise independently  Exercise Goals Re-Evaluation :  Exercise Goals Re-Evaluation     Row Name 05/28/23 1518 06/12/23 1553 06/27/23 1633 07/10/23 0817 07/22/23 1620     Exercise Goal Re-Evaluation   Exercise Goals Review Increase Physical Activity;Increase Strength and Stamina;Understanding of Exercise Prescription Increase Physical Activity;Increase Strength and Stamina;Understanding of Exercise Prescription Increase Physical Activity;Increase Strength and Stamina;Understanding of Exercise Prescription Increase Physical Activity;Increase Strength and Stamina;Understanding of Exercise Prescription Increase Physical Activity;Increase Strength and Stamina;Understanding of Exercise Prescription   Comments Ed did well in his first two sessions back in rehab since returning with a new referral. He worked at level 5 on  the T4 nustep and was able to walk 15 laps on the track. He also did well with 3 lb hand weights for resistance training. We will continue to monitor his progress in the program. Ed is doing well in rehab. He increased to 25 laps walked on the track and continues to use 3 lb hand weights for resistance training. He also has stayed consistent at level 3 on the T6 and level 5 on the T4 nustep. We will continue to monitor his progress in the program. Ed continues to do well in rehab. He was unable to make any workload increases during this review, but was however able to maintain his intensity on both the track and the T4 nustep. We will continue to monitor his progress in the program. Ed is doing well in rehab. He increased to 5lb handweights for resistance. He maintained level 3 on the T6 and level 1 on the arm ergometer. He decreased to level 3 on the T4 from level 4. He also decreased his laps to 17 on the track from 22. We will continue to monitor his progress in the program. Ed is doing well in rehab. He maintained his workloads at level 3 on both the T6 and T4 nustep machines. He also continues to walk 17 laps on the track. We will continue to monitor his progress in the program.   Expected Outcomes Short: Continue to follow current exercise prescription. Long: Continue exercise to improve strength and stamina. Short: Continue to push for more laps on the track. Long: Continue exercise to improve strength and stamina. Short: Continue to push for more track laps, increase to level 5 on the T4 nustep. Long: Continue exercise to improve strength and stamina. Short: Push for more laps on the track to get back to 22 laps and try level 4 on the T4 and T6 nusteps. Long: Continue exercise to improve strength and stamina. Short: Continue to push for more laps on the track. Long: Continue exercise to improve strength and stamina.    Row Name 08/08/23 1742             Exercise Goal Re-Evaluation   Exercise Goals  Review Increase Physical Activity;Increase Strength and Stamina;Understanding of Exercise Prescription       Comments Ed is doing well in rehab. He maintained walking 17 laps on the track and his workload at level 3 on T4 nustep. He decreased to level 1 on the T6 nustep from level 3. We will continue to monitor his progress in the program.       Expected Outcomes Short: Increase back to level 3 on the T6 nustep and push for more laps on the track. Long: Continue exercise to improve strength and stamina.                Discharge Exercise Prescription (Final Exercise Prescription Changes):  Exercise  Prescription Changes - 08/08/23 1700       Response to Exercise   Blood Pressure (Admit) 112/70    Blood Pressure (Exit) 118/70    Heart Rate (Admit) 75 bpm    Heart Rate (Exercise) 100 bpm    Heart Rate (Exit) 79 bpm    Rating of Perceived Exertion (Exercise) 14    Symptoms none    Duration Continue with 30 min of aerobic exercise without signs/symptoms of physical distress.    Intensity THRR unchanged      Progression   Progression Continue to progress workloads to maintain intensity without signs/symptoms of physical distress.    Average METs 1.78      Resistance Training   Training Prescription Yes    Weight 5 lb    Reps 10-15      Interval Training   Interval Training No      NuStep   Level 3    Minutes 15    METs 2      T5 Nustep   Level 1   T6   Minutes 15    METs 1.5      Track   Laps 17    Minutes 15    METs 1.92      Oxygen   Maintain Oxygen Saturation 88% or higher             Nutrition:  Target Goals: Understanding of nutrition guidelines, daily intake of sodium 1500mg , cholesterol 200mg , calories 30% from fat and 7% or less from saturated fats, daily to have 5 or more servings of fruits and vegetables.  Education: All About Nutrition: -Group instruction provided by verbal, written material, interactive activities, discussions, models, and  posters to present general guidelines for heart healthy nutrition including fat, fiber, MyPlate, the role of sodium in heart healthy nutrition, utilization of the nutrition label, and utilization of this knowledge for meal planning. Follow up email sent as well. Written material given at graduation. Flowsheet Row Cardiac Rehab from 11/19/2022 in [facility] Cardiac and Pulmonary Rehab  Education need identified 11/19/22       Biometrics:    Nutrition Therapy Plan and Nutrition Goals:   Nutrition Assessments:  MEDIFICTS Score Key: >=70 Need to make dietary changes  40-70 Heart Healthy Diet <= 40 Therapeutic Level Cholesterol Diet  Flowsheet Row Cardiac Rehab from 05/20/2023 in [facility] Cardiac and Pulmonary Rehab  Picture Your Plate Total Score on Admission 64      Picture Your Plate Scores: <40 Unhealthy dietary pattern with much room for improvement. 41-50 Dietary pattern unlikely to meet recommendations for good health and room for improvement. 51-60 More healthful dietary pattern, with some room for improvement.  >60 Healthy dietary pattern, although there may be some specific behaviors that could be improved.    Nutrition Goals Re-Evaluation:  Nutrition Goals Re-Evaluation     Row Name 06/13/23 1603 07/11/23 1601           Goals   Comment Patient deferred RD appointment. Patient deferred RD appointment.               Nutrition Goals Discharge (Final Nutrition Goals Re-Evaluation):  Nutrition Goals Re-Evaluation - 07/11/23 1601       Goals   Comment Patient deferred RD appointment.             Psychosocial: Target Goals: Acknowledge presence or absence of significant depression and/or stress, maximize coping skills, provide positive support system. Participant is able to verbalize types and ability to  use techniques and skills needed for reducing stress and depression.   Education: Stress, Anxiety, and Depression - Group verbal and visual presentation to define  topics covered.  Reviews how body is impacted by stress, anxiety, and depression.  Also discusses healthy ways to reduce stress and to treat/manage anxiety and depression.  Written material given at graduation.   Education: Sleep Hygiene -Provides group verbal and written instruction about how sleep can affect your health.  Define sleep hygiene, discuss sleep cycles and impact of sleep habits. Review good sleep hygiene tips.    Initial Review & Psychosocial Screening:  Initial Psych Review & Screening - 05/20/23 1520       Initial Review   Current issues with None Identified      Family Dynamics   Good Support System? Yes      Barriers   Psychosocial barriers to participate in program There are no identifiable barriers or psychosocial needs.      Screening Interventions   Interventions Encouraged to exercise;To provide support and resources with identified psychosocial needs;Provide feedback about the scores to participant    Expected Outcomes Short Term goal: Utilizing psychosocial counselor, staff and physician to assist with identification of specific Stressors or current issues interfering with healing process. Setting desired goal for each stressor or current issue identified.;Long Term Goal: Stressors or current issues are controlled or eliminated.;Short Term goal: Identification and review with participant of any Quality of Life or Depression concerns found by scoring the questionnaire.;Long Term goal: The participant improves quality of Life and PHQ9 Scores as seen by post scores and/or verbalization of changes             Quality of Life Scores:   Quality of Life - 05/20/23 1523       Quality of Life   Select Quality of Life      Quality of Life Scores   Health/Function Pre 27.6 %    Socioeconomic Pre 28 %    Psych/Spiritual Pre 30 %    Family Pre 27 %    GLOBAL Pre 28.13 %            Scores of 19 and below usually indicate a poorer quality of life in these  areas.  A difference of  2-3 points is a clinically meaningful difference.  A difference of 2-3 points in the total score of the Quality of Life Index has been associated with significant improvement in overall quality of life, self-image, physical symptoms, and general health in studies assessing change in quality of life.  PHQ-9: Review Flowsheet  More data exists      06/13/2023 05/16/2023 11/19/2022 01/12/2020 10/28/2019  Depression screen PHQ 2/9  Decreased Interest 0 0 1 3 2   Down, Depressed, Hopeless 0 0 1 0 0  PHQ - 2 Score 0 0 2 3 2   Altered sleeping 0 0 1 1 3   Tired, decreased energy 1 3 1 2 2   Change in appetite 1 3 1 1 1   Feeling bad or failure about yourself  0 0 0 0 0  Trouble concentrating 0 0 0 0 1  Moving slowly or fidgety/restless 0 0 0 0 1  Suicidal thoughts 0 0 0 1 0  PHQ-9 Score 2 6 5 8 10   Difficult doing work/chores Not difficult at all Not difficult at all Somewhat difficult Not difficult at all Somewhat difficult   Interpretation of Total Score  Total Score Depression Severity:  1-4 = Minimal depression, 5-9 = Mild  depression, 10-14 = Moderate depression, 15-19 = Moderately severe depression, 20-27 = Severe depression   Psychosocial Evaluation and Intervention:  Psychosocial Evaluation - 05/20/23 1520       Psychosocial Evaluation & Interventions   Interventions Encouraged to exercise with the program and follow exercise prescription    Comments Ed is returning to the program after another stent placement. He is motivated to attend the program and has no barriers to attending. He has completed PT for balance and plans to increase his stamina in the program. He relies on his lady friend for support and company    Expected Outcomes Short: attend cardiac rehab for education and exercise Long: develop and maintain positive self care habits    Continue Psychosocial Services  Follow up required by staff             Psychosocial Re-Evaluation:  Psychosocial  Re-Evaluation     Row Name 06/13/23 1557 07/11/23 1549           Psychosocial Re-Evaluation   Current issues with Current Psychotropic Meds Current Psychotropic Meds      Comments Reviewed patient health questionnaire (PHQ-9) with patient for follow up. Previously, patients score indicated signs/symptoms of depression.  Reviewed to see if patient is improving symptom wise while in program.  Score improved and patient states that it is because he is able to exercise and have more energy. Ed states that he is still taking his anxiety/depression medication. He also states that he is sleeping well, and is not having any stressors at this time. He can look to his "lady friend" when he is having stress, as well as exercise.      Expected Outcomes Short: Continue to attend [facility] regularly for regular exercise and social engagement. Long: Continue to improve symptoms and manage a positive mental state. Short: Continue to attend [facility] regularly for regular exercise and social engagement. Long: Continue to improve symptoms and manage a positive mental state.      Interventions Encouraged to attend Cardiac Rehabilitation for the exercise Encouraged to attend Cardiac Rehabilitation for the exercise      Continue Psychosocial Services  Follow up required by staff Follow up required by staff               Psychosocial Discharge (Final Psychosocial Re-Evaluation):  Psychosocial Re-Evaluation - 07/11/23 1549       Psychosocial Re-Evaluation   Current issues with Current Psychotropic Meds    Comments Ed states that he is still taking his anxiety/depression medication. He also states that he is sleeping well, and is not having any stressors at this time. He can look to his "lady friend" when he is having stress, as well as exercise.    Expected Outcomes Short: Continue to attend [facility] regularly for regular exercise and social engagement. Long: Continue to improve symptoms and manage a  positive mental state.    Interventions Encouraged to attend Cardiac Rehabilitation for the exercise    Continue Psychosocial Services  Follow up required by staff             Vocational Rehabilitation: Provide vocational rehab assistance to qualifying candidates.   Vocational Rehab Evaluation & Intervention:  Vocational Rehab - 05/20/23 1519       Initial Vocational Rehab Evaluation & Intervention   Assessment shows need for Vocational Rehabilitation No             Education: Education Goals: Education classes will be provided on a variety of topics geared toward  better understanding of heart health and risk factor modification. Participant will state understanding/return demonstration of topics presented as noted by education test scores.  Learning Barriers/Preferences:   General Cardiac Education Topics:  AED/CPR: - Group verbal and written instruction with the use of models to demonstrate the basic use of the AED with the basic ABC's of resuscitation.   Anatomy and Cardiac Procedures: - Group verbal and visual presentation and models provide information about basic cardiac anatomy and function. Reviews the testing methods done to diagnose heart disease and the outcomes of the test results. Describes the treatment choices: Medical Management, Angioplasty, or Coronary Bypass Surgery for treating various heart conditions including Myocardial Infarction, Angina, Valve Disease, and Cardiac Arrhythmias.  Written material given at graduation. Flowsheet Row Cardiac Rehab from 11/19/2022 in [facility] Cardiac and Pulmonary Rehab  Education need identified 11/19/22       Medication Safety: - Group verbal and visual instruction to review commonly prescribed medications for heart and lung disease. Reviews the medication, class of the drug, and side effects. Includes the steps to properly store meds and maintain the prescription regimen.  Written material given at graduation. Flowsheet  Row Cardiac Rehab from 10/07/2019 in [facility] Cardiac and Pulmonary Rehab  Date 10/07/19  Educator SB  Instruction Review Code 1- Verbalizes Understanding       Intimacy: - Group verbal instruction through game format to discuss how heart and lung disease can affect sexual intimacy. Written material given at graduation..   Know Your Numbers and Heart Failure: - Group verbal and visual instruction to discuss disease risk factors for cardiac and pulmonary disease and treatment options.  Reviews associated critical values for Overweight/Obesity, Hypertension, Cholesterol, and Diabetes.  Discusses basics of heart failure: signs/symptoms and treatments.  Introduces Heart Failure Zone chart for action plan for heart failure.  Written material given at graduation.   Infection Prevention: - Provides verbal and written material to individual with discussion of infection control including proper hand washing and proper equipment cleaning during exercise session. Flowsheet Row Cardiac Rehab from 11/19/2022 in [facility] Cardiac and Pulmonary Rehab  Date 11/19/22  Educator NT  Instruction Review Code 1- Verbalizes Understanding       Falls Prevention: - Provides verbal and written material to individual with discussion of falls prevention and safety. Flowsheet Row Cardiac Rehab from 11/19/2022 in [facility] Cardiac and Pulmonary Rehab  Date 11/19/22  Educator NT  Instruction Review Code 1- Verbalizes Understanding       Other: -Provides group and verbal instruction on various topics (see comments)   Knowledge Questionnaire Score:  Knowledge Questionnaire Score - 05/20/23 1519       Knowledge Questionnaire Score   Pre Score 24/26             Core Components/Risk Factors/Patient Goals at Admission:  Personal Goals and Risk Factors at Admission - 05/20/23 1520       Core Components/Risk Factors/Patient Goals on Admission    Weight Management Yes;Weight Maintenance    Intervention Weight  Management: Develop a combined nutrition and exercise program designed to reach desired caloric intake, while maintaining appropriate intake of nutrient and fiber, sodium and fats, and appropriate energy expenditure required for the weight goal.;Weight Management: Provide education and appropriate resources to help participant work on and attain dietary goals.    Expected Outcomes Short Term: Continue to assess and modify interventions until short term weight is achieved;Long Term: Adherence to nutrition and physical activity/exercise program aimed toward attainment of established weight goal;Weight Maintenance:  Understanding of the daily nutrition guidelines, which includes 25-35% calories from fat, 7% or less cal from saturated fats, less than 200mg  cholesterol, less than 1.5gm of sodium, & 5 or more servings of fruits and vegetables daily    Diabetes Yes    Intervention Provide education about signs/symptoms and action to take for hypo/hyperglycemia.;Provide education about proper nutrition, including hydration, and aerobic/resistive exercise prescription along with prescribed medications to achieve blood glucose in normal ranges: Fasting glucose 65-99 mg/dL    Expected Outcomes Short Term: Participant verbalizes understanding of the signs/symptoms and immediate care of hyper/hypoglycemia, proper foot care and importance of medication, aerobic/resistive exercise and nutrition plan for blood glucose control.;Long Term: Attainment of HbA1C < 7%.    Heart Failure Yes    Intervention Provide a combined exercise and nutrition program that is supplemented with education, support and counseling about heart failure. Directed toward relieving symptoms such as shortness of breath, decreased exercise tolerance, and extremity edema.    Expected Outcomes Improve functional capacity of life;Short term: Attendance in program 2-3 days a week with increased exercise capacity. Reported lower sodium intake. Reported  increased fruit and vegetable intake. Reports medication compliance.;Short term: Daily weights obtained and reported for increase. Utilizing diuretic protocols set by physician.;Long term: Adoption of self-care skills and reduction of barriers for early signs and symptoms recognition and intervention leading to self-care maintenance.    Hypertension Yes    Intervention Provide education on lifestyle modifcations including regular physical activity/exercise, weight management, moderate sodium restriction and increased consumption of fresh fruit, vegetables, and low fat dairy, alcohol moderation, and smoking cessation.;Monitor prescription use compliance.    Expected Outcomes Short Term: Continued assessment and intervention until BP is < 140/87mm HG in hypertensive participants. < 130/26mm HG in hypertensive participants with diabetes, heart failure or chronic kidney disease.;Long Term: Maintenance of blood pressure at goal levels.    Lipids Yes    Intervention Provide education and support for participant on nutrition & aerobic/resistive exercise along with prescribed medications to achieve LDL 70mg , HDL >40mg .    Expected Outcomes Short Term: Participant states understanding of desired cholesterol values and is compliant with medications prescribed. Participant is following exercise prescription and nutrition guidelines.;Long Term: Cholesterol controlled with medications as prescribed, with individualized exercise RX and with personalized nutrition plan. Value goals: LDL < 70mg , HDL > 40 mg.             Education:Diabetes - Individual verbal and written instruction to review signs/symptoms of diabetes, desired ranges of glucose level fasting, after meals and with exercise. Acknowledge that pre and post exercise glucose checks will be done for 3 sessions at entry of program. Flowsheet Row Cardiac Rehab from 01/13/2020 in [facility] Cardiac and Pulmonary Rehab  Date 01/13/20  Educator [facility]  Instruction  Review Code 1- Verbalizes Understanding       Core Components/Risk Factors/Patient Goals Review:   Goals and Risk Factor Review     Row Name 06/13/23 1551 07/11/23 1603           Core Components/Risk Factors/Patient Goals Review   Personal Goals Review Weight Management/Obesity Weight Management/Obesity      Review Ed is continuing the program and is working toward losing weight. He is 224 pounds currently. and wants to reach a weight goal of 200 pounds. He is going to work toward losing a few pounds. Ed continues to try to lose some weight. He currently weighed in at 219.7lbs today and would like to get down to a target  weight of 200lbs.      Expected Outcomes Short: lose some weight. Long: reach weight goal. Short: Lose 5 lbs. Long: Continue to exercise and diet in order to lose some weight.               Core Components/Risk Factors/Patient Goals at Discharge (Final Review):   Goals and Risk Factor Review - 07/11/23 1603       Core Components/Risk Factors/Patient Goals Review   Personal Goals Review Weight Management/Obesity    Review Ed continues to try to lose some weight. He currently weighed in at 219.7lbs today and would like to get down to a target weight of 200lbs.    Expected Outcomes Short: Lose 5 lbs. Long: Continue to exercise and diet in order to lose some weight.             ITP Comments:  ITP Comments     Row Name 05/20/23 1537 05/22/23 0754 06/19/23 1206 07/17/23 1343 08/14/23 1023   ITP Comments First full day of exercise!  Patient was oriented to gym and equipment including functions, settings, policies, and procedures.  Patient's individual exercise prescription and treatment plan were reviewed.  All starting workloads were established based on the results of the 6 minute walk test done at initial orientation visit.  The plan for exercise progression was also introduced and progression will be customized based on patient's performance and goals. 30 Day  review completed. Medical Director ITP review done, changes made as directed, and signed approval by Medical Director. New patient new diagnosis. 30 Day review completed. Medical Director ITP review done, changes made as directed, and signed approval by Medical Director. 30 Day review completed. Medical Director ITP review done, changes made as directed, and signed approval by Medical Director. 30 Day review completed. Medical Director ITP review done, changes made as directed, and signed approval by Medical Director.            Comments:

## 2023-08-15 ENCOUNTER — Encounter: Attending: Cardiovascular Disease

## 2023-08-15 DIAGNOSIS — Z48812 Encounter for surgical aftercare following surgery on the circulatory system: Secondary | ICD-10-CM | POA: Insufficient documentation

## 2023-08-15 DIAGNOSIS — I252 Old myocardial infarction: Secondary | ICD-10-CM | POA: Insufficient documentation

## 2023-08-15 DIAGNOSIS — Z955 Presence of coronary angioplasty implant and graft: Secondary | ICD-10-CM | POA: Insufficient documentation

## 2023-08-17 ENCOUNTER — Other Ambulatory Visit: Payer: Self-pay | Admitting: Cardiovascular Disease

## 2023-08-19 ENCOUNTER — Encounter

## 2023-08-22 ENCOUNTER — Encounter: Admitting: *Deleted

## 2023-08-22 DIAGNOSIS — Z955 Presence of coronary angioplasty implant and graft: Secondary | ICD-10-CM

## 2023-08-22 DIAGNOSIS — Z48812 Encounter for surgical aftercare following surgery on the circulatory system: Secondary | ICD-10-CM | POA: Diagnosis present

## 2023-08-22 DIAGNOSIS — I252 Old myocardial infarction: Secondary | ICD-10-CM | POA: Diagnosis not present

## 2023-08-22 NOTE — Progress Notes (Signed)
 Daily Session Note  Patient Details  Name: Devin [name]. MRN: [id number] Date of Birth: 12-Dec-1945 Referring Provider:   Flowsheet Row Cardiac Rehab from 05/20/2023 in [facility] Cardiac and Pulmonary Rehab  Referring Provider Dr. Debborah Fairly    Encounter Date: 08/22/2023  Check In:  Session Check In - 08/22/23 1540       Check-In   Supervising physician immediately available to respond to emergencies See telemetry face sheet for immediately available ER MD    Location [facility]-Cardiac & Pulmonary Rehab    Staff Present Sue Em RN,BSN;Maxon Conetta BS, Exercise Physiologist;Noah Tickle, BS, Exercise Physiologist    Virtual Visit No    Medication changes reported     No    Fall or balance concerns reported    No    Warm-up and Cool-down Performed on first and last piece of equipment    Resistance Training Performed Yes    VAD Patient? No    PAD/SET Patient? No      Pain Assessment   Currently in Pain? No/denies             Social History   Tobacco Use  Smoking Status Former  Smokeless Tobacco Never  Tobacco Comments   Quit over 40 years ago    Goals Met:  Independence with exercise equipment Exercise tolerated well No report of concerns or symptoms today Strength training completed today  Goals Unmet:  Not Applicable  Comments: Pt able to follow exercise prescription today without complaint.  Will continue to monitor for progression.    Dr. Firman Hughes is Medical Director for [facility] Cardiac Rehabilitation.  Dr. Fuad Aleskerov is Medical Director for [facility] Pulmonary Rehabilitation.

## 2023-08-26 ENCOUNTER — Encounter: Admitting: *Deleted

## 2023-08-26 DIAGNOSIS — Z48812 Encounter for surgical aftercare following surgery on the circulatory system: Secondary | ICD-10-CM | POA: Diagnosis not present

## 2023-08-26 DIAGNOSIS — Z955 Presence of coronary angioplasty implant and graft: Secondary | ICD-10-CM

## 2023-08-26 NOTE — Progress Notes (Signed)
 Daily Session Note  Patient Details  Name: Zachory Mangual [facility]. MRN: [id number] Date of Birth: [birth year] Referring Provider:   Flowsheet Row Cardiac Rehab from 05/20/2023 in [facility] Cardiac and Pulmonary Rehab  Referring Provider Dr. Debborah Fairly    Encounter Date: 08/26/2023  Check In:  Session Check In - 08/26/23 1530       Check-In   Supervising physician immediately available to respond to emergencies See telemetry face sheet for immediately available ER MD    Location [facility]-Cardiac & Pulmonary Rehab    Staff Present Sue Em RN,BSN;Susanne Bice, RN, BSN, CCRP;Maxon Conetta BS, Exercise Physiologist;Margaret Best, MS, Exercise Physiologist    Virtual Visit No    Medication changes reported     No    Fall or balance concerns reported    No    Warm-up and Cool-down Performed on first and last piece of equipment    Resistance Training Performed Yes    VAD Patient? No    PAD/SET Patient? No      Pain Assessment   Currently in Pain? No/denies             Social History   Tobacco Use  Smoking Status Former  Smokeless Tobacco Never  Tobacco Comments   Quit over 40 years ago    Goals Met:  Independence with exercise equipment Exercise tolerated well No report of concerns or symptoms today Strength training completed today  Goals Unmet:  Not Applicable  Comments: Pt able to follow exercise prescription today without complaint.  Will continue to monitor for progression.    Dr. Firman Hughes is Medical Director for [facility] Cardiac Rehabilitation.  Dr. Fuad Aleskerov is Medical Director for [facility] Pulmonary Rehabilitation.

## 2023-08-29 ENCOUNTER — Encounter: Admitting: *Deleted

## 2023-08-29 DIAGNOSIS — Z48812 Encounter for surgical aftercare following surgery on the circulatory system: Secondary | ICD-10-CM | POA: Diagnosis not present

## 2023-08-29 DIAGNOSIS — Z955 Presence of coronary angioplasty implant and graft: Secondary | ICD-10-CM

## 2023-08-29 NOTE — Progress Notes (Signed)
 Daily Session Note  Patient Details  Name: Devin [name] [facility]. MRN: [id number] Date of Birth: January 01, 1946 Referring Provider:   Flowsheet Row Cardiac Rehab from 05/20/2023 in [facility] Cardiac and Pulmonary Rehab  Referring Provider Dr. Debborah Fairly    Encounter Date: 08/29/2023  Check In:  Session Check In - 08/29/23 1557       Check-In   Supervising physician immediately available to respond to emergencies See telemetry face sheet for immediately available ER MD    Location [facility]-Cardiac & Pulmonary Rehab    Staff Present Sue Em RN,BSN;Joseph Neldon [place], BS, RRT, CPFT;Noah Tickle, BS, Exercise Physiologist    Virtual Visit No    Medication changes reported     No    Fall or balance concerns reported    No    Warm-up and Cool-down Performed on first and last piece of equipment    Resistance Training Performed Yes    VAD Patient? No    PAD/SET Patient? No      Pain Assessment   Currently in Pain? No/denies             Social History   Tobacco Use  Smoking Status Former  Smokeless Tobacco Never  Tobacco Comments   Quit over 40 years ago    Goals Met:  Independence with exercise equipment Exercise tolerated well No report of concerns or symptoms today Strength training completed today  Goals Unmet:  Not Applicable  Comments: Pt able to follow exercise prescription today without complaint.  Will continue to monitor for progression.    Dr. Firman Hughes is Medical Director for [facility] Cardiac Rehabilitation.  Dr. Fuad Aleskerov is Medical Director for [facility] Pulmonary Rehabilitation.

## 2023-09-02 ENCOUNTER — Encounter: Admitting: *Deleted

## 2023-09-02 DIAGNOSIS — Z955 Presence of coronary angioplasty implant and graft: Secondary | ICD-10-CM

## 2023-09-02 DIAGNOSIS — Z48812 Encounter for surgical aftercare following surgery on the circulatory system: Secondary | ICD-10-CM | POA: Diagnosis not present

## 2023-09-02 NOTE — Progress Notes (Signed)
 Daily Session Note  Patient Details  Name: Ignace Mandigo [facility]. MRN: [id number] Date of Birth: [birth year] Referring Provider:   Flowsheet Row Cardiac Rehab from 05/20/2023 in [facility] Cardiac and Pulmonary Rehab  Referring Provider Dr. Denyse Bathe    Encounter Date: 09/02/2023  Check In:  Session Check In - 09/02/23 1549       Check-In   Supervising physician immediately available to respond to emergencies See telemetry face sheet for immediately available ER MD    Location [facility]-Cardiac & Pulmonary Rehab    Staff Present Hoy Rodney RN,BSN;Maxon Burnell BS, Exercise Physiologist;Kelly Dyane HECKLE, ACSM CEP, Exercise Physiologist;Noah Tickle, BS, Exercise Physiologist    Virtual Visit No    Medication changes reported     No    Fall or balance concerns reported    No    Warm-up and Cool-down Performed on first and last piece of equipment    Resistance Training Performed Yes    VAD Patient? No    PAD/SET Patient? No      Pain Assessment   Currently in Pain? No/denies             Social History   Tobacco Use  Smoking Status Former  Smokeless Tobacco Never  Tobacco Comments   Quit over 40 years ago    Goals Met:  Independence with exercise equipment Exercise tolerated well No report of concerns or symptoms today Strength training completed today  Goals Unmet:  Not Applicable  Comments: Pt able to follow exercise prescription today without complaint.  Will continue to monitor for progression.    Dr. Oneil Pinal is Medical Director for [facility] Cardiac Rehabilitation.  Dr. Fuad Aleskerov is Medical Director for [facility] Pulmonary Rehabilitation.

## 2023-09-05 ENCOUNTER — Encounter: Admitting: *Deleted

## 2023-09-05 ENCOUNTER — Encounter (INDEPENDENT_AMBULATORY_CARE_PROVIDER_SITE_OTHER): Payer: Medicare HMO

## 2023-09-05 ENCOUNTER — Ambulatory Visit (INDEPENDENT_AMBULATORY_CARE_PROVIDER_SITE_OTHER): Payer: Medicare HMO | Admitting: Vascular Surgery

## 2023-09-05 DIAGNOSIS — Z955 Presence of coronary angioplasty implant and graft: Secondary | ICD-10-CM

## 2023-09-05 DIAGNOSIS — Z48812 Encounter for surgical aftercare following surgery on the circulatory system: Secondary | ICD-10-CM | POA: Diagnosis not present

## 2023-09-05 NOTE — Progress Notes (Signed)
 Daily Session Note  Patient Details  Name: Devin [name]. MRN: [id number] Date of Birth: 06-16-1945 Referring Provider:   Flowsheet Row Cardiac Rehab from 05/20/2023 in [facility] Cardiac and Pulmonary Rehab  Referring Provider Dr. Denyse Bathe    Encounter Date: 09/05/2023  Check In:  Session Check In - 09/05/23 1517       Check-In   Supervising physician immediately available to respond to emergencies See telemetry face sheet for immediately available ER MD    Location [facility]-Cardiac & Pulmonary Rehab    Staff Present Hoy Rodney RN,BSN;Joseph [facility] BS, Exercise Physiologist;Noah Tickle, BS, Exercise Physiologist    Virtual Visit No    Medication changes reported     No    Fall or balance concerns reported    No    Warm-up and Cool-down Performed on first and last piece of equipment    Resistance Training Performed Yes    VAD Patient? No    PAD/SET Patient? No      Pain Assessment   Currently in Pain? No/denies             Social History   Tobacco Use  Smoking Status Former  Smokeless Tobacco Never  Tobacco Comments   Quit over 40 years ago    Goals Met:  Independence with exercise equipment Exercise tolerated well No report of concerns or symptoms today Strength training completed today  Goals Unmet:  Not Applicable  Comments: Pt able to follow exercise prescription today without complaint.  Will continue to monitor for progression.    Dr. Oneil Pinal is Medical Director for [facility] Cardiac Rehabilitation.  Dr. Fuad Aleskerov is Medical Director for [facility] Pulmonary Rehabilitation.

## 2023-09-07 ENCOUNTER — Other Ambulatory Visit: Payer: Self-pay | Admitting: Cardiovascular Disease

## 2023-09-07 DIAGNOSIS — I2510 Atherosclerotic heart disease of native coronary artery without angina pectoris: Secondary | ICD-10-CM

## 2023-09-07 DIAGNOSIS — I5020 Unspecified systolic (congestive) heart failure: Secondary | ICD-10-CM

## 2023-09-07 DIAGNOSIS — I25700 Atherosclerosis of coronary artery bypass graft(s), unspecified, with unstable angina pectoris: Secondary | ICD-10-CM

## 2023-09-07 DIAGNOSIS — I739 Peripheral vascular disease, unspecified: Secondary | ICD-10-CM

## 2023-09-07 DIAGNOSIS — I4891 Unspecified atrial fibrillation: Secondary | ICD-10-CM

## 2023-09-09 ENCOUNTER — Encounter: Admitting: *Deleted

## 2023-09-09 DIAGNOSIS — Z48812 Encounter for surgical aftercare following surgery on the circulatory system: Secondary | ICD-10-CM | POA: Diagnosis not present

## 2023-09-09 DIAGNOSIS — Z955 Presence of coronary angioplasty implant and graft: Secondary | ICD-10-CM

## 2023-09-09 NOTE — Progress Notes (Signed)
 Daily Session Note  Patient Details  Name: Devin [name] [facility]. MRN: [id number] Date of Birth: 1946-01-27 Referring Provider:   Flowsheet Row Cardiac Rehab from 05/20/2023 in [facility] Cardiac and Pulmonary Rehab  Referring Provider Dr. Denyse Bathe    Encounter Date: 09/09/2023  Check In:  Session Check In - 09/09/23 1547       Check-In   Supervising physician immediately available to respond to emergencies See telemetry face sheet for immediately available ER MD    Location [facility]-Cardiac & Pulmonary Rehab    Staff Present Hoy Rodney RN,BSN;Joseph [facility] RCP,RRT,BSRT;Margaret Best, MS, Exercise Physiologist    Virtual Visit No    Medication changes reported     No    Warm-up and Cool-down Performed on first and last piece of equipment    Resistance Training Performed Yes    VAD Patient? No    PAD/SET Patient? No      Pain Assessment   Currently in Pain? No/denies             Social History   Tobacco Use  Smoking Status Former  Smokeless Tobacco Never  Tobacco Comments   Quit over 40 years ago    Goals Met:  Independence with exercise equipment Exercise tolerated well No report of concerns or symptoms today Strength training completed today  Goals Unmet:  Not Applicable  Comments: Pt able to follow exercise prescription today without complaint.  Will continue to monitor for progression.    Dr. Oneil Pinal is Medical Director for [facility] Cardiac Rehabilitation.  Dr. Fuad Aleskerov is Medical Director for [facility] Pulmonary Rehabilitation.

## 2023-09-11 ENCOUNTER — Encounter: Payer: Self-pay | Admitting: *Deleted

## 2023-09-11 DIAGNOSIS — Z955 Presence of coronary angioplasty implant and graft: Secondary | ICD-10-CM

## 2023-09-11 NOTE — Progress Notes (Signed)
 Cardiac Individual Treatment Plan  Patient Details  Name: Devin [name]. MRN: [id number] Date of Birth: [birth year] Referring Provider:   Flowsheet Row Cardiac Rehab from 05/20/2023 in [facility] Cardiac and Pulmonary Rehab  Referring Provider Dr. Denyse Bathe    Initial Encounter Date:  Flowsheet Row Cardiac Rehab from 05/20/2023 in [name]  Date 05/20/23    Visit Diagnosis: Status post coronary artery stent placement  Patient's Home Medications on Admission:  Current Outpatient Medications:    albuterol  (VENTOLIN  HFA) 108 (90 Base) MCG/ACT inhaler, Inhale 2 puffs into the lungs every 6 (six) hours as needed for wheezing or shortness of breath., Disp: , Rfl:    amiodarone  (PACERONE ) 200 MG tablet, Take 1 tablet (200 mg total) by mouth daily., Disp: 30 tablet, Rfl: 2   atorvastatin  (LIPITOR ) 80 MG tablet, Take 80 mg by mouth daily., Disp: , Rfl:    buPROPion  (WELLBUTRIN  XL) 300 MG 24 hr tablet, Take 300 mg by mouth daily., Disp: , Rfl:    clopidogrel  (PLAVIX ) 75 MG tablet, Take 1 tablet (75 mg total) by mouth daily with breakfast., Disp: 30 tablet, Rfl: 11   docusate (COLACE) 60 MG/15ML syrup, Take 60 mg by mouth daily., Disp: , Rfl:    ELIQUIS  5 MG TABS tablet, TAKE 1 TABLET BY MOUTH TWICE A DAY, Disp: 60 tablet, Rfl: 3   ENTRESTO  97-103 MG, TAKE 1 TABLET BY MOUTH TWICE A DAY, Disp: 180 tablet, Rfl: 1   EPINEPHrine  0.3 mg/0.3 mL IJ SOAJ injection, Inject 0.3 mg into the muscle as needed for anaphylaxis., Disp: , Rfl:    ezetimibe  (ZETIA ) 10 MG tablet, Take 10 mg by mouth daily., Disp: , Rfl:    fluticasone  (FLONASE ) 50 MCG/ACT nasal spray, SPRAY 2 SPRAYS INTO EACH NOSTRIL EVERY DAY, Disp: , Rfl:    gabapentin  (NEURONTIN ) 300 MG capsule, Take 300 mg by mouth 2 (two) times daily., Disp: , Rfl:    loratadine  (CLARITIN ) 10 MG tablet, Take 10 mg by mouth daily., Disp: , Rfl:    metFORMIN  (GLUCOPHAGE ) 500 MG tablet, Take 1,000 mg by mouth 2 (two) times daily.,  Disp: , Rfl:    metoprolol  succinate (TOPROL  XL) 50 MG 24 hr tablet, Take 1 tablet (50 mg total) by mouth daily. Take with or immediately following a meal., Disp: 30 tablet, Rfl: 11   nitroGLYCERIN  (NITROSTAT ) 0.4 MG SL tablet, Place 0.4 mg under the tongue every 5 (five) minutes as needed for chest pain., Disp: , Rfl:    pantoprazole  (PROTONIX ) 40 MG tablet, TAKE 1 TABLET BY MOUTH EVERY DAY, Disp: 30 tablet, Rfl: 1   ranolazine  (RANEXA ) 500 MG 12 hr tablet, TAKE 1 TABLET BY MOUTH TWICE A DAY, Disp: 180 tablet, Rfl: 1  Past Medical History: 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   Hypertension    Ischemic cardiomyopathy    MI, old    Peripheral neuropathy    PFO (patent foramen ovale) 05/2019   s/p repair   Sleep apnea    Spinal stenosis of lumbar region  TIA (transient ischemic attack)     Tobacco Use: Social History   Tobacco Use  Smoking Status Former  Smokeless Tobacco Never  Tobacco Comments   Quit over 40 years ago    Labs: Review Flowsheet  More data exists      Latest Ref Rng & Units 04/08/2020 07/04/2020 04/12/2022 04/13/2022 10/13/2022  Labs for ITP Cardiac and Pulmonary Rehab  Cholestrol 0 - 200 mg/dL - - - - 894   LDL (calc) 0 - 99 mg/dL - - - - 35   HDL-C >59 mg/dL - - - - 59   Trlycerides <150 mg/dL - - - - 55   Hemoglobin A1c 4.8 - 5.6 % 5.8  - - 5.5  6.3   Bicarbonate 20.0 - 28.0 mmol/L - - 23.9  - -  TCO2 22 - 32 mmol/L - 24  - - -  Acid-base deficit 0.0 - 2.0 mmol/L - - 0.1  - -  O2 Saturation % - - 90.3  - -     Exercise Target Goals: Exercise Program Goal: Individual exercise prescription set  using results from initial 6 min walk test and THRR while considering  patient's activity barriers and safety.   Exercise Prescription Goal: Initial exercise prescription builds to 30-45 minutes a day of aerobic activity, 2-3 days per week.  Home exercise guidelines will be given to patient during program as part of exercise prescription that the participant will acknowledge.   Education: Aerobic Exercise: - Group verbal and visual presentation on the components of exercise prescription. Introduces F.I.T.T principle from ACSM for exercise prescriptions.  Reviews F.I.T.T. principles of aerobic exercise including progression. Written material given at graduation. Flowsheet Row Cardiac Rehab from 11/19/2022 in [name]  Education need identified 11/19/22    Education: Resistance Exercise: - Group verbal and visual presentation on the components of exercise prescription. Introduces F.I.T.T principle from ACSM for exercise prescriptions  Reviews F.I.T.T. principles of resistance exercise including progression. Written material given at graduation.    Education: Exercise & Equipment Safety: - Individual verbal instruction and demonstration of equipment use and safety with use of the equipment. Flowsheet Row Cardiac Rehab from 11/19/2022 in [facility] Cardiac and Pulmonary Rehab  Date 11/19/22  Educator NT  Instruction Review Code 1- Verbalizes Understanding    Education: Exercise Physiology & General Exercise Guidelines: - Group verbal and written instruction with models to review the exercise physiology of the cardiovascular system and associated critical values. Provides general exercise guidelines with specific guidelines to those with heart or lung disease.  Flowsheet Row Cardiac Rehab from 10/07/2019 in [name]  Date 09/23/19  Educator AS  Instruction Review Code 1- Verbalizes Understanding    Education: Flexibility, Balance, Mind/Body  Relaxation: - Group verbal and visual presentation with interactive activity on the components of exercise prescription. Introduces F.I.T.T principle from ACSM for exercise prescriptions. Reviews F.I.T.T. principles of flexibility and balance exercise training including progression. Also discusses the mind body connection.  Reviews various relaxation techniques to help reduce and manage stress (i.e. Deep breathing, progressive muscle relaxation, and visualization). Balance handout provided to take home. Written material given at graduation.   Activity Barriers & Risk Stratification:  Activity Barriers & Cardiac Risk Stratification - 05/20/23 1536       Activity Barriers & Cardiac Risk Stratification   Activity Barriers Right Knee Replacement;Left Knee Replacement;Muscular Weakness;Balance Concerns;Other (comment)    Comments drop foot, impaired gait, neuropathy    Cardiac Risk Stratification  Moderate          6 Minute Walk:  6 Minute Walk     Row Name 05/20/23 1535         6 Minute Walk   Phase Initial     Distance 720 feet     Distance Feet Change 100 ft     Walk Time 6 minutes     # of Rest Breaks 0     MPH 1.36     METS 1.13     RPE 13     Perceived Dyspnea  1     VO2 Peak 3.96     Symptoms No     Resting HR 65 bpm     Resting Oxygen Saturation  95 %     Exercise Oxygen Saturation  during 6 min walk 95 %     Max Ex. HR 92 bpm     Max Ex. BP 122/64        Oxygen Initial Assessment:   Oxygen Re-Evaluation:   Oxygen Discharge (Final Oxygen Re-Evaluation):   Initial Exercise Prescription:  Initial Exercise Prescription - 05/20/23 1500       Date of Initial Exercise RX and Referring Provider   Date 05/20/23    Referring Provider Dr. Denyse Bathe      Oxygen   Maintain Oxygen Saturation 88% or higher      Treadmill   MPH 1      NuStep   Level 4   T6: 3   Minutes 15    METs 2.3      T5 Nustep   Level 4    Minutes 15    METs 3      Track   Laps  15    Minutes 15    METs 1.82      Resistance Training   Training Prescription Yes    Weight 4 lb    Reps 10-15          Perform Capillary Blood Glucose checks as needed.  Exercise Prescription Changes:   Exercise Prescription Changes     Row Name 05/28/23 1500 06/12/23 1500 06/27/23 1600 07/10/23 0800 07/22/23 1600     Response to Exercise   Blood Pressure (Admit) 100/62 112/70 138/66 110/60 142/80   Blood Pressure (Exercise) -- 118/60 140/62 126/80 --   Blood Pressure (Exit) 112/64 98/54 118/60 110/58 132/70   Heart Rate (Admit) 65 bpm 78 bpm 63 bpm 77 bpm 77 bpm   Heart Rate (Exercise) 90 bpm 96 bpm 93 bpm 99 bpm 106 bpm   Heart Rate (Exit) 67 bpm 70 bpm 75 bpm 84 bpm 82 bpm   Rating of Perceived Exertion (Exercise) 13 15 13 17 15    Symptoms none none none none none   Comments First two days back in rehab -- -- -- --   Duration Continue with 30 min of aerobic exercise without signs/symptoms of physical distress. Continue with 30 min of aerobic exercise without signs/symptoms of physical distress. Continue with 30 min of aerobic exercise without signs/symptoms of physical distress. Continue with 30 min of aerobic exercise without signs/symptoms of physical distress. Continue with 30 min of aerobic exercise without signs/symptoms of physical distress.   Intensity THRR unchanged THRR unchanged THRR unchanged THRR unchanged THRR unchanged     Progression   Progression Continue to progress workloads to maintain intensity without signs/symptoms of physical distress. Continue to progress workloads to maintain intensity without signs/symptoms of physical distress. Continue to progress workloads  to maintain intensity without signs/symptoms of physical distress. Continue to progress workloads to maintain intensity without signs/symptoms of physical distress. Continue to progress workloads to maintain intensity without signs/symptoms of physical distress.   Average METs 2.27 2.09 2.14  1.61 1.82     Resistance Training   Training Prescription Yes Yes Yes Yes Yes   Weight 3 lb 3 lb 3 lb 5 lb 5 lb   Reps 10-15 10-15 10-15 10-15 10-15     Interval Training   Interval Training No No No No No     NuStep   Level 5 5  T6: 3 4 3 3   T6: level 3   Minutes 15 15 15 15 15    METs 2.4 3  T6: 2 2.4 1.5 1.7  T6: 1.9 METs     Arm Ergometer   Level -- -- -- 1 --   Minutes -- -- -- 15 --   METs -- -- -- 1 --     T5 Nustep   Level -- -- -- 3  T6 --   Minutes -- -- -- 15 --   METs -- -- -- 1.7 --     Track   Laps 15 25 22 17 17    Minutes 15 15 15 15 15    METs 1.82 2.36 2.2 1.92 1.92     Oxygen   Maintain Oxygen Saturation 88% or higher 88% or higher 88% or higher 88% or higher 88% or higher    Row Name 08/08/23 1700 09/04/23 1400           Response to Exercise   Blood Pressure (Admit) 112/70 110/60      Blood Pressure (Exit) 118/70 100/68      Heart Rate (Admit) 75 bpm 73 bpm      Heart Rate (Exercise) 100 bpm 101 bpm      Heart Rate (Exit) 79 bpm 73 bpm      Rating of Perceived Exertion (Exercise) 14 13      Symptoms none none      Duration Continue with 30 min of aerobic exercise without signs/symptoms of physical distress. Continue with 30 min of aerobic exercise without signs/symptoms of physical distress.      Intensity THRR unchanged THRR unchanged        Progression   Progression Continue to progress workloads to maintain intensity without signs/symptoms of physical distress. Continue to progress workloads to maintain intensity without signs/symptoms of physical distress.      Average METs 1.78 1.84        Resistance Training   Training Prescription Yes Yes      Weight 5 lb 5 lb      Reps 10-15 10-15        Interval Training   Interval Training No No        NuStep   Level 3 3      Minutes 15 15      METs 2 2        T5 Nustep   Level 1  T6 --      Minutes 15 --      METs 1.5 --        Track   Laps 17 16      Minutes 15 15      METs 1.92  1.87        Oxygen   Maintain Oxygen Saturation 88% or higher 88% or higher         Exercise Comments:  Exercise Comments     Row Name 05/20/23 1537           Exercise Comments First full day of exercise!  Patient was oriented to gym and equipment including functions, settings, policies, and procedures.  Patient's individual exercise prescription and treatment plan were reviewed.  All starting workloads were established based on the results of the 6 minute walk test done at initial orientation visit.  The plan for exercise progression was also introduced and progression will be customized based on patient's performance and goals.          Exercise Goals and Review:   Exercise Goals     Row Name 05/20/23 1536             Exercise Goals   Increase Physical Activity Yes       Intervention Develop an individualized exercise prescription for aerobic and resistive training based on initial evaluation findings, risk stratification, comorbidities and participant's personal goals.;Provide advice, education, support and counseling about physical activity/exercise needs.       Expected Outcomes Long Term: Add in home exercise to make exercise part of routine and to increase amount of physical activity.;Long Term: Exercising regularly at least 3-5 days a week.;Short Term: Attend rehab on a regular basis to increase amount of physical activity.       Able to understand and use rate of perceived exertion (RPE) scale Yes       Intervention Provide education and explanation on how to use RPE scale       Expected Outcomes Short Term: Able to use RPE daily in rehab to express subjective intensity level;Long Term:  Able to use RPE to guide intensity level when exercising independently       Able to understand and use Dyspnea scale Yes       Intervention Provide education and explanation on how to use Dyspnea scale       Expected Outcomes Short Term: Able to use Dyspnea scale daily in rehab to  express subjective sense of shortness of breath during exertion;Long Term: Able to use Dyspnea scale to guide intensity level when exercising independently       Knowledge and understanding of Target Heart Rate Range (THRR) Yes       Intervention Provide education and explanation of THRR including how the numbers were predicted and where they are located for reference       Expected Outcomes Short Term: Able to state/look up THRR;Short Term: Able to use daily as guideline for intensity in rehab;Long Term: Able to use THRR to govern intensity when exercising independently       Able to check pulse independently Yes       Intervention Provide education and demonstration on how to check pulse in carotid and radial arteries.;Review the importance of being able to check your own pulse for safety during independent exercise       Expected Outcomes Short Term: Able to explain why pulse checking is important during independent exercise;Long Term: Able to check pulse independently and accurately       Understanding of Exercise Prescription Yes       Intervention Provide education, explanation, and written materials on patient's individual exercise prescription       Expected Outcomes Short Term: Able to explain program exercise prescription;Long Term: Able to explain home exercise prescription to exercise independently          Exercise Goals Re-Evaluation :  Exercise Goals Re-Evaluation     Row  Name 05/28/23 1518 06/12/23 1553 06/27/23 1633 07/10/23 0817 07/22/23 1620     Exercise Goal Re-Evaluation   Exercise Goals Review Increase Physical Activity;Increase Strength and Stamina;Understanding of Exercise Prescription Increase Physical Activity;Increase Strength and Stamina;Understanding of Exercise Prescription Increase Physical Activity;Increase Strength and Stamina;Understanding of Exercise Prescription Increase Physical Activity;Increase Strength and Stamina;Understanding of Exercise Prescription  Increase Physical Activity;Increase Strength and Stamina;Understanding of Exercise Prescription   Comments Ed did well in his first two sessions back in rehab since returning with a new referral. He worked at level 5 on the T4 nustep and was able to walk 15 laps on the track. He also did well with 3 lb hand weights for resistance training. We will continue to monitor his progress in the program. Ed is doing well in rehab. He increased to 25 laps walked on the track and continues to use 3 lb hand weights for resistance training. He also has stayed consistent at level 3 on the T6 and level 5 on the T4 nustep. We will continue to monitor his progress in the program. Ed continues to do well in rehab. He was unable to make any workload increases during this review, but was however able to maintain his intensity on both the track and the T4 nustep. We will continue to monitor his progress in the program. Ed is doing well in rehab. He increased to 5lb handweights for resistance. He maintained level 3 on the T6 and level 1 on the arm ergometer. He decreased to level 3 on the T4 from level 4. He also decreased his laps to 17 on the track from 22. We will continue to monitor his progress in the program. Ed is doing well in rehab. He maintained his workloads at level 3 on both the T6 and T4 nustep machines. He also continues to walk 17 laps on the track. We will continue to monitor his progress in the program.   Expected Outcomes Short: Continue to follow current exercise prescription. Long: Continue exercise to improve strength and stamina. Short: Continue to push for more laps on the track. Long: Continue exercise to improve strength and stamina. Short: Continue to push for more track laps, increase to level 5 on the T4 nustep. Long: Continue exercise to improve strength and stamina. Short: Push for more laps on the track to get back to 22 laps and try level 4 on the T4 and T6 nusteps. Long: Continue exercise to improve  strength and stamina. Short: Continue to push for more laps on the track. Long: Continue exercise to improve strength and stamina.    Row Name 08/08/23 1742 08/22/23 1758 09/04/23 1456         Exercise Goal Re-Evaluation   Exercise Goals Review Increase Physical Activity;Increase Strength and Stamina;Understanding of Exercise Prescription Increase Physical Activity;Increase Strength and Stamina;Understanding of Exercise Prescription Increase Physical Activity;Increase Strength and Stamina;Understanding of Exercise Prescription     Comments Ed is doing well in rehab. He maintained walking 17 laps on the track and his workload at level 3 on T4 nustep. He decreased to level 1 on the T6 nustep from level 3. We will continue to monitor his progress in the program. Ed has not attended since 5/22 as he has been out sick with pneumonia. He plans to return the week of 6/9. We will continue to monitor his progress when he returns. Ed returned to rehab in this review after being out sick with pneumonia. Ed is doing well in rehab. He maintained level 3  on the T4 nustep. He decreased his laps by 1 to 16 laps on the track. We will continue to monitor his progress in the program.     Expected Outcomes Short: Increase back to level 3 on the T6 nustep and push for more laps on the track. Long: Continue exercise to improve strength and stamina. Short: Return to rehab once recovered. Long: Continue exercise to improve strength and stamina. Short: Try level 4 on the T4 nustep and get back to 17 laps on the track. Long: Continue exercise to improve strength and stamina.        Discharge Exercise Prescription (Final Exercise Prescription Changes):  Exercise Prescription Changes - 09/04/23 1400       Response to Exercise   Blood Pressure (Admit) 110/60    Blood Pressure (Exit) 100/68    Heart Rate (Admit) 73 bpm    Heart Rate (Exercise) 101 bpm    Heart Rate (Exit) 73 bpm    Rating of Perceived Exertion (Exercise)  13    Symptoms none    Duration Continue with 30 min of aerobic exercise without signs/symptoms of physical distress.    Intensity THRR unchanged      Progression   Progression Continue to progress workloads to maintain intensity without signs/symptoms of physical distress.    Average METs 1.84      Resistance Training   Training Prescription Yes    Weight 5 lb    Reps 10-15      Interval Training   Interval Training No      NuStep   Level 3    Minutes 15    METs 2      Track   Laps 16    Minutes 15    METs 1.87      Oxygen   Maintain Oxygen Saturation 88% or higher          Nutrition:  Target Goals: Understanding of nutrition guidelines, daily intake of sodium 1500mg , cholesterol 200mg , calories 30% from fat and 7% or less from saturated fats, daily to have 5 or more servings of fruits and vegetables.  Education: All About Nutrition: -Group instruction provided by verbal, written material, interactive activities, discussions, models, and posters to present general guidelines for heart healthy nutrition including fat, fiber, MyPlate, the role of sodium in heart healthy nutrition, utilization of the nutrition label, and utilization of this knowledge for meal planning. Follow up email sent as well. Written material given at graduation. Flowsheet Row Cardiac Rehab from 11/19/2022 in [facility] Cardiac and Pulmonary Rehab  Education need identified 11/19/22    Biometrics:    Nutrition Therapy Plan and Nutrition Goals:   Nutrition Assessments:  MEDIFICTS Score Key: >=70 Need to make dietary changes  40-70 Heart Healthy Diet <= 40 Therapeutic Level Cholesterol Diet  Flowsheet Row Cardiac Rehab from 05/20/2023 in [name]  Picture Your Plate Total Score on Admission 64   Picture Your Plate Scores: <59 Unhealthy dietary pattern with much room for improvement. 41-50 Dietary pattern unlikely to meet recommendations for good health and room for  improvement. 51-60 More healthful dietary pattern, with some room for improvement.  >60 Healthy dietary pattern, although there may be some specific behaviors that could be improved.    Nutrition Goals Re-Evaluation:  Nutrition Goals Re-Evaluation     Row Name 06/13/23 1603 07/11/23 1601           Goals   Comment Patient deferred RD appointment. Patient deferred RD appointment.  Nutrition Goals Discharge (Final Nutrition Goals Re-Evaluation):  Nutrition Goals Re-Evaluation - 07/11/23 1601       Goals   Comment Patient deferred RD appointment.          Psychosocial: Target Goals: Acknowledge presence or absence of significant depression and/or stress, maximize coping skills, provide positive support system. Participant is able to verbalize types and ability to use techniques and skills needed for reducing stress and depression.   Education: Stress, Anxiety, and Depression - Group verbal and visual presentation to define topics covered.  Reviews how body is impacted by stress, anxiety, and depression.  Also discusses healthy ways to reduce stress and to treat/manage anxiety and depression.  Written material given at graduation.   Education: Sleep Hygiene -Provides group verbal and written instruction about how sleep can affect your health.  Define sleep hygiene, discuss sleep cycles and impact of sleep habits. Review good sleep hygiene tips.    Initial Review & Psychosocial Screening:  Initial Psych Review & Screening - 05/20/23 1520       Initial Review   Current issues with None Identified      Family Dynamics   Good Support System? Yes      Barriers   Psychosocial barriers to participate in program There are no identifiable barriers or psychosocial needs.      Screening Interventions   Interventions Encouraged to exercise;To provide support and resources with identified psychosocial needs;Provide feedback about the scores to participant    Expected  Outcomes Short Term goal: Utilizing psychosocial counselor, staff and physician to assist with identification of specific Stressors or current issues interfering with healing process. Setting desired goal for each stressor or current issue identified.;Long Term Goal: Stressors or current issues are controlled or eliminated.;Short Term goal: Identification and review with participant of any Quality of Life or Depression concerns found by scoring the questionnaire.;Long Term goal: The participant improves quality of Life and PHQ9 Scores as seen by post scores and/or verbalization of changes          Quality of Life Scores:   Quality of Life - 05/20/23 1523       Quality of Life   Select Quality of Life      Quality of Life Scores   Health/Function Pre 27.6 %    Socioeconomic Pre 28 %    Psych/Spiritual Pre 30 %    Family Pre 27 %    GLOBAL Pre 28.13 %         Scores of 19 and below usually indicate a poorer quality of life in these areas.  A difference of  2-3 points is a clinically meaningful difference.  A difference of 2-3 points in the total score of the Quality of Life Index has been associated with significant improvement in overall quality of life, self-image, physical symptoms, and general health in studies assessing change in quality of life.  PHQ-9: Review Flowsheet  More data exists      06/13/2023 05/16/2023 11/19/2022 01/12/2020 10/28/2019  Depression screen PHQ 2/9  Decreased Interest 0 0 1 3 2   Down, Depressed, Hopeless 0 0 1 0 0  PHQ - 2 Score 0 0 2 3 2   Altered sleeping 0 0 1 1 3   Tired, decreased energy 1 3 1 2 2   Change in appetite 1 3 1 1 1   Feeling bad or failure about yourself  0 0 0 0 0  Trouble concentrating 0 0 0 0 1  Moving slowly or fidgety/restless 0 0  0 0 1  Suicidal thoughts 0 0 0 1 0  PHQ-9 Score 2 6 5 8 10   Difficult doing work/chores Not difficult at all Not difficult at all Somewhat difficult Not difficult at all Somewhat difficult   Interpretation  of Total Score  Total Score Depression Severity:  1-4 = Minimal depression, 5-9 = Mild depression, 10-14 = Moderate depression, 15-19 = Moderately severe depression, 20-27 = Severe depression   Psychosocial Evaluation and Intervention:  Psychosocial Evaluation - 05/20/23 1520       Psychosocial Evaluation & Interventions   Interventions Encouraged to exercise with the program and follow exercise prescription    Comments Ed is returning to the program after another stent placement. He is motivated to attend the program and has no barriers to attending. He has completed PT for balance and plans to increase his stamina in the program. He relies on his lady friend for support and company    Expected Outcomes Short: attend cardiac rehab for education and exercise Long: develop and maintain positive self care habits    Continue Psychosocial Services  Follow up required by staff          Psychosocial Re-Evaluation:  Psychosocial Re-Evaluation     Row Name 06/13/23 1557 07/11/23 1549           Psychosocial Re-Evaluation   Current issues with Current Psychotropic Meds Current Psychotropic Meds      Comments Reviewed patient health questionnaire (PHQ-9) with patient for follow up. Previously, patients score indicated signs/symptoms of depression.  Reviewed to see if patient is improving symptom wise while in program.  Score improved and patient states that it is because he is able to exercise and have more energy. Ed states that he is still taking his anxiety/depression medication. He also states that he is sleeping well, and is not having any stressors at this time. He can look to his lady friend when he is having stress, as well as exercise.      Expected Outcomes Short: Continue to attend [facility] regularly for regular exercise and social engagement. Long: Continue to improve symptoms and manage a positive mental state. Short: Continue to attend [facility] regularly for regular exercise and  social engagement. Long: Continue to improve symptoms and manage a positive mental state.      Interventions Encouraged to attend Cardiac Rehabilitation for the exercise Encouraged to attend Cardiac Rehabilitation for the exercise      Continue Psychosocial Services  Follow up required by staff Follow up required by staff         Psychosocial Discharge (Final Psychosocial Re-Evaluation):  Psychosocial Re-Evaluation - 07/11/23 1549       Psychosocial Re-Evaluation   Current issues with Current Psychotropic Meds    Comments Ed states that he is still taking his anxiety/depression medication. He also states that he is sleeping well, and is not having any stressors at this time. He can look to his lady friend when he is having stress, as well as exercise.    Expected Outcomes Short: Continue to attend [facility] regularly for regular exercise and social engagement. Long: Continue to improve symptoms and manage a positive mental state.    Interventions Encouraged to attend Cardiac Rehabilitation for the exercise    Continue Psychosocial Services  Follow up required by staff          Vocational Rehabilitation: Provide vocational rehab assistance to qualifying candidates.   Vocational Rehab Evaluation & Intervention:  Vocational Rehab - 05/20/23 1519  Initial Vocational Rehab Evaluation & Intervention   Assessment shows need for Vocational Rehabilitation No          Education: Education Goals: Education classes will be provided on a variety of topics geared toward better understanding of heart health and risk factor modification. Participant will state understanding/return demonstration of topics presented as noted by education test scores.  Learning Barriers/Preferences:   General Cardiac Education Topics:  AED/CPR: - Group verbal and written instruction with the use of models to demonstrate the basic use of the AED with the basic ABC's of resuscitation.   Anatomy and  Cardiac Procedures: - Group verbal and visual presentation and models provide information about basic cardiac anatomy and function. Reviews the testing methods done to diagnose heart disease and the outcomes of the test results. Describes the treatment choices: Medical Management, Angioplasty, or Coronary Bypass Surgery for treating various heart conditions including Myocardial Infarction, Angina, Valve Disease, and Cardiac Arrhythmias.  Written material given at graduation. Flowsheet Row Cardiac Rehab from 11/19/2022 in [facility] Cardiac and Pulmonary Rehab  Education need identified 11/19/22    Medication Safety: - Group verbal and visual instruction to review commonly prescribed medications for heart and lung disease. Reviews the medication, class of the drug, and side effects. Includes the steps to properly store meds and maintain the prescription regimen.  Written material given at graduation. Flowsheet Row Cardiac Rehab from 10/07/2019 in [facility] Cardiac and Pulmonary Rehab  Date 10/07/19  Educator SB  Instruction Review Code 1- Verbalizes Understanding    Intimacy: - Group verbal instruction through game format to discuss how heart and lung disease can affect sexual intimacy. Written material given at graduation..   Know Your Numbers and Heart Failure: - Group verbal and visual instruction to discuss disease risk factors for cardiac and pulmonary disease and treatment options.  Reviews associated critical values for Overweight/Obesity, Hypertension, Cholesterol, and Diabetes.  Discusses basics of heart failure: signs/symptoms and treatments.  Introduces Heart Failure Zone chart for action plan for heart failure.  Written material given at graduation.   Infection Prevention: - Provides verbal and written material to individual with discussion of infection control including proper hand washing and proper equipment cleaning during exercise session. Flowsheet Row Cardiac Rehab from 11/19/2022 in [name]  Cardiac and Pulmonary Rehab  Date 11/19/22  Educator NT  Instruction Review Code 1- Verbalizes Understanding    Falls Prevention: - Provides verbal and written material to individual with discussion of falls prevention and safety. Flowsheet Row Cardiac Rehab from 11/19/2022 in [name] Cardiac and Pulmonary Rehab  Date 11/19/22  Educator NT  Instruction Review Code 1- Verbalizes Understanding    Other: -Provides group and verbal instruction on various topics (see comments)   Knowledge Questionnaire Score:  Knowledge Questionnaire Score - 05/20/23 1519       Knowledge Questionnaire Score   Pre Score 24/26          Core Components/Risk Factors/Patient Goals at Admission:  Personal Goals and Risk Factors at Admission - 05/20/23 1520       Core Components/Risk Factors/Patient Goals on Admission    Weight Management Yes;Weight Maintenance    Intervention Weight Management: Develop a combined nutrition and exercise program designed to reach desired caloric intake, while maintaining appropriate intake of nutrient and fiber, sodium and fats, and appropriate energy expenditure required for the weight goal.;Weight Management: Provide education and appropriate resources to help participant work on and attain dietary goals.    Expected Outcomes Short Term: Continue to assess and  modify interventions until short term weight is achieved;Long Term: Adherence to nutrition and physical activity/exercise program aimed toward attainment of established weight goal;Weight Maintenance: Understanding of the daily nutrition guidelines, which includes 25-35% calories from fat, 7% or less cal from saturated fats, less than 200mg  cholesterol, less than 1.5gm of sodium, & 5 or more servings of fruits and vegetables daily    Diabetes Yes    Intervention Provide education about signs/symptoms and action to take for hypo/hyperglycemia.;Provide education about proper nutrition, including hydration, and  aerobic/resistive exercise prescription along with prescribed medications to achieve blood glucose in normal ranges: Fasting glucose 65-99 mg/dL    Expected Outcomes Short Term: Participant verbalizes understanding of the signs/symptoms and immediate care of hyper/hypoglycemia, proper foot care and importance of medication, aerobic/resistive exercise and nutrition plan for blood glucose control.;Long Term: Attainment of HbA1C < 7%.    Heart Failure Yes    Intervention Provide a combined exercise and nutrition program that is supplemented with education, support and counseling about heart failure. Directed toward relieving symptoms such as shortness of breath, decreased exercise tolerance, and extremity edema.    Expected Outcomes Improve functional capacity of life;Short term: Attendance in program 2-3 days a week with increased exercise capacity. Reported lower sodium intake. Reported increased fruit and vegetable intake. Reports medication compliance.;Short term: Daily weights obtained and reported for increase. Utilizing diuretic protocols set by physician.;Long term: Adoption of self-care skills and reduction of barriers for early signs and symptoms recognition and intervention leading to self-care maintenance.    Hypertension Yes    Intervention Provide education on lifestyle modifcations including regular physical activity/exercise, weight management, moderate sodium restriction and increased consumption of fresh fruit, vegetables, and low fat dairy, alcohol moderation, and smoking cessation.;Monitor prescription use compliance.    Expected Outcomes Short Term: Continued assessment and intervention until BP is < 140/72mm HG in hypertensive participants. < 130/50mm HG in hypertensive participants with diabetes, heart failure or chronic kidney disease.;Long Term: Maintenance of blood pressure at goal levels.    Lipids Yes    Intervention Provide education and support for participant on nutrition &  aerobic/resistive exercise along with prescribed medications to achieve LDL 70mg , HDL >40mg .    Expected Outcomes Short Term: Participant states understanding of desired cholesterol values and is compliant with medications prescribed. Participant is following exercise prescription and nutrition guidelines.;Long Term: Cholesterol controlled with medications as prescribed, with individualized exercise RX and with personalized nutrition plan. Value goals: LDL < 70mg , HDL > 40 mg.          Education:Diabetes - Individual verbal and written instruction to review signs/symptoms of diabetes, desired ranges of glucose level fasting, after meals and with exercise. Acknowledge that pre and post exercise glucose checks will be done for 3 sessions at entry of program. Flowsheet Row Cardiac Rehab from 01/13/2020 in [facility] Cardiac and Pulmonary Rehab  Date 01/13/20  Educator [facility]  Instruction Review Code 1- Verbalizes Understanding    Core Components/Risk Factors/Patient Goals Review:   Goals and Risk Factor Review     Row Name 06/13/23 1551 07/11/23 1603           Core Components/Risk Factors/Patient Goals Review   Personal Goals Review Weight Management/Obesity Weight Management/Obesity      Review Ed is continuing the program and is working toward losing weight. He is 224 pounds currently. and wants to reach a weight goal of 200 pounds. He is going to work toward losing a few pounds. Ed continues to try to lose  some weight. He currently weighed in at 219.7lbs today and would like to get down to a target weight of 200lbs.      Expected Outcomes Short: lose some weight. Long: reach weight goal. Short: Lose 5 lbs. Long: Continue to exercise and diet in order to lose some weight.         Core Components/Risk Factors/Patient Goals at Discharge (Final Review):   Goals and Risk Factor Review - 07/11/23 1603       Core Components/Risk Factors/Patient Goals Review   Personal Goals Review Weight  Management/Obesity    Review Ed continues to try to lose some weight. He currently weighed in at 219.7lbs today and would like to get down to a target weight of 200lbs.    Expected Outcomes Short: Lose 5 lbs. Long: Continue to exercise and diet in order to lose some weight.          ITP Comments:  ITP Comments     Row Name 05/20/23 1537 05/22/23 0754 06/19/23 1206 07/17/23 1343 08/14/23 1023   ITP Comments First full day of exercise!  Patient was oriented to gym and equipment including functions, settings, policies, and procedures.  Patient's individual exercise prescription and treatment plan were reviewed.  All starting workloads were established based on the results of the 6 minute walk test done at initial orientation visit.  The plan for exercise progression was also introduced and progression will be customized based on patient's performance and goals. 30 Day review completed. Medical Director ITP review done, changes made as directed, and signed approval by Medical Director. New patient new diagnosis. 30 Day review completed. Medical Director ITP review done, changes made as directed, and signed approval by Medical Director. 30 Day review completed. Medical Director ITP review done, changes made as directed, and signed approval by Medical Director. 30 Day review completed. Medical Director ITP review done, changes made as directed, and signed approval by Medical Director.    Row Name 09/11/23 1222           ITP Comments 30 Day review completed. Medical Director ITP review done, changes made as directed, and signed approval by Medical Director.          Comments: 30 day review

## 2023-09-12 ENCOUNTER — Encounter: Attending: Cardiovascular Disease

## 2023-09-12 DIAGNOSIS — Z955 Presence of coronary angioplasty implant and graft: Secondary | ICD-10-CM | POA: Insufficient documentation

## 2023-09-12 DIAGNOSIS — I214 Non-ST elevation (NSTEMI) myocardial infarction: Secondary | ICD-10-CM | POA: Insufficient documentation

## 2023-09-16 ENCOUNTER — Encounter: Admitting: *Deleted

## 2023-09-16 DIAGNOSIS — Z955 Presence of coronary angioplasty implant and graft: Secondary | ICD-10-CM | POA: Diagnosis present

## 2023-09-16 DIAGNOSIS — I214 Non-ST elevation (NSTEMI) myocardial infarction: Secondary | ICD-10-CM | POA: Diagnosis present

## 2023-09-16 NOTE — Progress Notes (Signed)
 Daily Session Note  Patient Details  Name: Devin [name]. MRN: [id number] Date of Birth: 1945/12/09 Referring Provider:   Flowsheet Row Cardiac Rehab from 05/20/2023 in [facility] Cardiac and Pulmonary Rehab  Referring Provider Dr. Denyse Bathe    Encounter Date: 09/16/2023  Check In:  Session Check In - 09/16/23 1541       Check-In   Supervising physician immediately available to respond to emergencies See telemetry face sheet for immediately available ER MD    Location [facility]-Cardiac & Pulmonary Rehab    Staff Present Hoy Rodney RN,BSN;Joseph Rolinda NORWOOD HARMAN Cecilie Delores, [place], RRT, CPFT;Kelly Dyane BS, ACSM CEP, Exercise Physiologist    Virtual Visit No    Medication changes reported     No    Fall or balance concerns reported    No    Warm-up and Cool-down Performed on first and last piece of equipment    Resistance Training Performed Yes    VAD Patient? No    PAD/SET Patient? No      Pain Assessment   Currently in Pain? No/denies             Social History   Tobacco Use  Smoking Status Former  Smokeless Tobacco Never  Tobacco Comments   Quit over 40 years ago    Goals Met:  Independence with exercise equipment Exercise tolerated well No report of concerns or symptoms today Strength training completed today  Goals Unmet:  Not Applicable  Comments: Pt able to follow exercise prescription today without complaint.  Will continue to monitor for progression.    Dr. Oneil Pinal is Medical Director for [facility] Cardiac Rehabilitation.  Dr. Fuad Aleskerov is Medical Director for [facility] Pulmonary Rehabilitation.

## 2023-09-19 ENCOUNTER — Encounter: Admitting: *Deleted

## 2023-09-19 DIAGNOSIS — Z955 Presence of coronary angioplasty implant and graft: Secondary | ICD-10-CM

## 2023-09-19 NOTE — Progress Notes (Signed)
 Daily Session Note  Patient Details  Name: Callum Wolf [facility]. MRN: [id number] Date of Birth: 07-25-1945 Referring Provider:   Flowsheet Row Cardiac Rehab from 05/20/2023 in [facility] Cardiac and Pulmonary Rehab  Referring Provider Dr. Denyse Bathe    Encounter Date: 09/19/2023  Check In:  Session Check In - 09/19/23 1525       Check-In   Supervising physician immediately available to respond to emergencies See telemetry face sheet for immediately available ER MD    Location [facility]-Cardiac & Pulmonary Rehab    Staff Present Hoy Rodney RN,BSN;Joseph [facility] BS, Exercise Physiologist;Kristen Coble RN,BC,MSN    Virtual Visit No    Medication changes reported     No    Warm-up and Cool-down Performed on first and last piece of equipment    Resistance Training Performed Yes    VAD Patient? No    PAD/SET Patient? No      Pain Assessment   Currently in Pain? No/denies             Social History   Tobacco Use  Smoking Status Former  Smokeless Tobacco Never  Tobacco Comments   Quit over 40 years ago    Goals Met:  Independence with exercise equipment Exercise tolerated well No report of concerns or symptoms today Strength training completed today  Goals Unmet:  Not Applicable  Comments: Pt able to follow exercise prescription today without complaint.  Will continue to monitor for progression.    Dr. Oneil Pinal is Medical Director for [facility] Cardiac Rehabilitation.  Dr. Fuad Aleskerov is Medical Director for [facility] Pulmonary Rehabilitation.

## 2023-09-23 ENCOUNTER — Encounter: Admitting: *Deleted

## 2023-09-23 DIAGNOSIS — Z955 Presence of coronary angioplasty implant and graft: Secondary | ICD-10-CM | POA: Diagnosis not present

## 2023-09-23 NOTE — Progress Notes (Signed)
 Daily Session Note  Patient Details  Name: Sahil Milner [facility] MRN: [id number] Date of Birth: 1946-01-20 Referring Provider:   Flowsheet Row Cardiac Rehab from 05/20/2023 in [facility] Cardiac and Pulmonary Rehab  Referring Provider Dr. Denyse Bathe    Encounter Date: 09/23/2023  Check In:  Session Check In - 09/23/23 1546       Check-In   Supervising physician immediately available to respond to emergencies See telemetry face sheet for immediately available ER MD    Location [facility]-Cardiac & Pulmonary Rehab    Staff Present Fairy Plater RCP,RRT,BSRT;Raeli Wiens [facility] RN,BSN;Margaret Best, MS, Exercise Physiologist;Kelly [facility]    Virtual Visit No    Medication changes reported     No    Fall or balance concerns reported    No    Warm-up and Cool-down Performed on first and last piece of equipment    Resistance Training Performed Yes    VAD Patient? No    PAD/SET Patient? No      Pain Assessment   Currently in Pain? No/denies             Social History   Tobacco Use  Smoking Status Former  Smokeless Tobacco Never  Tobacco Comments   Quit over 40 years ago    Goals Met:  Independence with exercise equipment Exercise tolerated well No report of concerns or symptoms today Strength training completed today  Goals Unmet:  Not Applicable  Comments: Pt able to follow exercise prescription today without complaint.  Will continue to monitor for progression.    Dr. Oneil Pinal is Medical Director for [facility] Cardiac Rehabilitation.  Dr. Fuad Aleskerov is Medical Director for [facility] Pulmonary Rehabilitation.

## 2023-09-26 ENCOUNTER — Encounter: Admitting: *Deleted

## 2023-09-26 DIAGNOSIS — Z955 Presence of coronary angioplasty implant and graft: Secondary | ICD-10-CM | POA: Diagnosis not present

## 2023-09-26 DIAGNOSIS — I214 Non-ST elevation (NSTEMI) myocardial infarction: Secondary | ICD-10-CM

## 2023-09-26 NOTE — Progress Notes (Signed)
 Daily Session Note  Patient Details  Name: Devin [name] [facility]. MRN: [id number] Date of Birth: [birth year] Referring Provider:   Flowsheet Row Cardiac Rehab from 05/20/2023 in [facility] Cardiac and Pulmonary Rehab  Referring Provider Dr. Denyse Bathe    Encounter Date: 09/26/2023  Check In:  Session Check In - 09/26/23 1557       Check-In   Supervising physician immediately available to respond to emergencies See telemetry face sheet for immediately available ER MD    Location [facility]-Cardiac & Pulmonary Rehab    Staff Present Hoy Rodney RN,BSN;Kristen Coble RN,BC,MSN;Maxon Conetta BS, Exercise Physiologist;Joseph Rolinda RCP,RRT,BSRT    Virtual Visit No    Medication changes reported     No    Fall or balance concerns reported    No    Warm-up and Cool-down Performed on first and last piece of equipment    Resistance Training Performed Yes    VAD Patient? No    PAD/SET Patient? No      Pain Assessment   Currently in Pain? No/denies             Social History   Tobacco Use  Smoking Status Former  Smokeless Tobacco Never  Tobacco Comments   Quit over 40 years ago    Goals Met:  Independence with exercise equipment Exercise tolerated well No report of concerns or symptoms today Strength training completed today  Goals Unmet:  Not Applicable  Comments: Pt able to follow exercise prescription today without complaint.  Will continue to monitor for progression.    Dr. Oneil Pinal is Medical Director for [facility] Cardiac Rehabilitation.  Dr. Fuad Aleskerov is Medical Director for [facility] Pulmonary Rehabilitation.

## 2023-09-30 ENCOUNTER — Encounter

## 2023-10-02 ENCOUNTER — Other Ambulatory Visit: Payer: Self-pay | Admitting: Cardiovascular Disease

## 2023-10-03 ENCOUNTER — Encounter

## 2023-10-07 ENCOUNTER — Encounter: Admitting: *Deleted

## 2023-10-07 DIAGNOSIS — Z955 Presence of coronary angioplasty implant and graft: Secondary | ICD-10-CM

## 2023-10-07 DIAGNOSIS — I214 Non-ST elevation (NSTEMI) myocardial infarction: Secondary | ICD-10-CM

## 2023-10-07 NOTE — Progress Notes (Signed)
 Daily Session Note  Patient Details  Name: Kimon Loewen [facility]. MRN: [id number] Date of Birth: [birth year] Referring Provider:   Flowsheet Row Cardiac Rehab from 05/20/2023 in [facility] Cardiac and Pulmonary Rehab  Referring Provider Dr. Denyse Bathe    Encounter Date: 10/07/2023  Check In:  Session Check In - 10/07/23 1554       Check-In   Supervising physician immediately available to respond to emergencies See telemetry face sheet for immediately available ER MD    Location [facility]-Cardiac & Pulmonary Rehab    Staff Present Hoy Rodney RN,BSN;Joseph [facility] RCP,RRT,BSRT;Margaret Best, MS, Exercise Physiologist;Maxon Conetta BS, Exercise Physiologist    Virtual Visit No    Medication changes reported     No    Fall or balance concerns reported    No    Warm-up and Cool-down Performed on first and last piece of equipment    Resistance Training Performed Yes    VAD Patient? No    PAD/SET Patient? No      Pain Assessment   Currently in Pain? No/denies             Social History   Tobacco Use  Smoking Status Former  Smokeless Tobacco Never  Tobacco Comments   Quit over 40 years ago    Goals Met:  Independence with exercise equipment Exercise tolerated well No report of concerns or symptoms today Strength training completed today  Goals Unmet:  Not Applicable  Comments: Pt able to follow exercise prescription today without complaint.  Will continue to monitor for progression.    Dr. Oneil Pinal is Medical Director for [facility] Cardiac Rehabilitation.  Dr. Fuad Aleskerov is Medical Director for [facility] Pulmonary Rehabilitation.

## 2023-10-09 DIAGNOSIS — I214 Non-ST elevation (NSTEMI) myocardial infarction: Secondary | ICD-10-CM

## 2023-10-09 DIAGNOSIS — Z955 Presence of coronary angioplasty implant and graft: Secondary | ICD-10-CM

## 2023-10-09 NOTE — Progress Notes (Signed)
 Cardiac Individual Treatment Plan  Patient Details  Name: Devin [name]. MRN: [id number] Date of Birth: 10-Jan-1946 Referring Provider:   Flowsheet Row Cardiac Rehab from 05/20/2023 in [facility] Cardiac and Pulmonary Rehab  Referring Provider Dr. Denyse Bathe    Initial Encounter Date:  Flowsheet Row Cardiac Rehab from 05/20/2023 in [facility] Cardiac and Pulmonary Rehab  Date 05/20/23    Visit Diagnosis: Status post coronary artery stent placement  NSTEMI (non-ST elevation myocardial infarction) [facility])  Patient's Home Medications on Admission:  Current Outpatient Medications:    albuterol  (VENTOLIN  HFA) 108 (90 Base) MCG/ACT inhaler, Inhale 2 puffs into the lungs every 6 (six) hours as needed for wheezing or shortness of breath., Disp: , Rfl:    amiodarone  (PACERONE ) 200 MG tablet, Take 1 tablet (200 mg total) by mouth daily., Disp: 30 tablet, Rfl: 2   atorvastatin  (LIPITOR ) 80 MG tablet, Take 80 mg by mouth daily., Disp: , Rfl:    buPROPion  (WELLBUTRIN  XL) 300 MG 24 hr tablet, Take 300 mg by mouth daily., Disp: , Rfl:    clopidogrel  (PLAVIX ) 75 MG tablet, Take 1 tablet (75 mg total) by mouth daily with breakfast., Disp: 30 tablet, Rfl: 11   docusate (COLACE) 60 MG/15ML syrup, Take 60 mg by mouth daily., Disp: , Rfl:    ELIQUIS  5 MG TABS tablet, TAKE 1 TABLET BY MOUTH TWICE A DAY, Disp: 60 tablet, Rfl: 3   ENTRESTO  97-103 MG, TAKE 1 TABLET BY MOUTH TWICE A DAY, Disp: 180 tablet, Rfl: 1   EPINEPHrine  0.3 mg/0.3 mL IJ SOAJ injection, Inject 0.3 mg into the muscle as needed for anaphylaxis., Disp: , Rfl:    ezetimibe  (ZETIA ) 10 MG tablet, Take 10 mg by mouth daily., Disp: , Rfl:    fluticasone  (FLONASE ) 50 MCG/ACT nasal spray, SPRAY 2 SPRAYS INTO EACH NOSTRIL EVERY DAY, Disp: , Rfl:    gabapentin  (NEURONTIN ) 300 MG capsule, Take 300 mg by mouth 2 (two) times daily., Disp: , Rfl:    loratadine  (CLARITIN ) 10 MG tablet, Take 10 mg by mouth daily., Disp: , Rfl:    metFORMIN  (GLUCOPHAGE ) 500 MG  tablet, Take 1,000 mg by mouth 2 (two) times daily., Disp: , Rfl:    metoprolol  succinate (TOPROL -XL) 25 MG 24 hr tablet, TAKE 1 TAB BY MOUTH ONCE DAILY, Disp: 90 tablet, Rfl: 2   nitroGLYCERIN  (NITROSTAT ) 0.4 MG SL tablet, Place 0.4 mg under the tongue every 5 (five) minutes as needed for chest pain., Disp: , Rfl:    pantoprazole  (PROTONIX ) 40 MG tablet, TAKE 1 TABLET BY MOUTH EVERY DAY, Disp: 30 tablet, Rfl: 1   ranolazine  (RANEXA ) 500 MG 12 hr tablet, TAKE 1 TABLET BY MOUTH TWICE A DAY, Disp: 180 tablet, Rfl: 1  Past Medical History: 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   Hypertension    Ischemic cardiomyopathy    MI, old    Peripheral neuropathy    PFO (patent foramen ovale) 05/2019   s/p repair   Sleep apnea    Spinal stenosis of lumbar region    TIA (  transient ischemic attack)     Tobacco Use: Social History   Tobacco Use  Smoking Status Former  Smokeless Tobacco Never  Tobacco Comments   Quit over 40 years ago    Labs: Review Flowsheet  More data exists      Latest Ref Rng & Units 04/08/2020 07/04/2020 04/12/2022 04/13/2022 10/13/2022  Labs for ITP Cardiac and Pulmonary Rehab  Cholestrol 0 - 200 mg/dL - - - - 894   LDL (calc) 0 - 99 mg/dL - - - - 35   HDL-C >59 mg/dL - - - - 59   Trlycerides <150 mg/dL - - - - 55   Hemoglobin A1c 4.8 - 5.6 % 5.8  - - 5.5  6.3   Bicarbonate 20.0 - 28.0 mmol/L - - 23.9  - -  TCO2 22 - 32 mmol/L - 24  - - -  Acid-base deficit 0.0 - 2.0 mmol/L - - 0.1  - -  O2 Saturation % - - 90.3  - -     Exercise Target Goals: Exercise Program Goal: Individual exercise prescription set using  results from initial 6 min walk test and THRR while considering  patient's activity barriers and safety.   Exercise Prescription Goal: Initial exercise prescription builds to 30-45 minutes a day of aerobic activity, 2-3 days per week.  Home exercise guidelines will be given to patient during program as part of exercise prescription that the participant will acknowledge.   Education: Aerobic Exercise: - Group verbal and visual presentation on the components of exercise prescription. Introduces F.I.T.T principle from ACSM for exercise prescriptions.  Reviews F.I.T.T. principles of aerobic exercise including progression. Written material given at graduation. Flowsheet Row Cardiac Rehab from 11/19/2022 in [name]  Education need identified 11/19/22    Education: Resistance Exercise: - Group verbal and visual presentation on the components of exercise prescription. Introduces F.I.T.T principle from ACSM for exercise prescriptions  Reviews F.I.T.T. principles of resistance exercise including progression. Written material given at graduation.    Education: Exercise & Equipment Safety: - Individual verbal instruction and demonstration of equipment use and safety with use of the equipment. Flowsheet Row Cardiac Rehab from 11/19/2022 in [facility] Cardiac and Pulmonary Rehab  Date 11/19/22  Educator NT  Instruction Review Code 1- Verbalizes Understanding    Education: Exercise Physiology & General Exercise Guidelines: - Group verbal and written instruction with models to review the exercise physiology of the cardiovascular system and associated critical values. Provides general exercise guidelines with specific guidelines to those with heart or lung disease.  Flowsheet Row Cardiac Rehab from 10/07/2019 in [facility] Cardiac and Pulmonary Rehab  Date 09/23/19  Educator AS  Instruction Review Code 1- Verbalizes Understanding    Education: Flexibility, Balance, Mind/Body Relaxation: -  Group verbal and visual presentation with interactive activity on the components of exercise prescription. Introduces F.I.T.T principle from ACSM for exercise prescriptions. Reviews F.I.T.T. principles of flexibility and balance exercise training including progression. Also discusses the mind body connection.  Reviews various relaxation techniques to help reduce and manage stress (i.e. Deep breathing, progressive muscle relaxation, and visualization). Balance handout provided to take home. Written material given at graduation.   Activity Barriers & Risk Stratification:  Activity Barriers & Cardiac Risk Stratification - 05/20/23 1536       Activity Barriers & Cardiac Risk Stratification   Activity Barriers Right Knee Replacement;Left Knee Replacement;Muscular Weakness;Balance Concerns;Other (comment)    Comments drop foot, impaired gait, neuropathy    Cardiac Risk Stratification Moderate  6 Minute Walk:  6 Minute Walk     Row Name 05/20/23 1535         6 Minute Walk   Phase Initial     Distance 720 feet     Distance Feet Change 100 ft     Walk Time 6 minutes     # of Rest Breaks 0     MPH 1.36     METS 1.13     RPE 13     Perceived Dyspnea  1     VO2 Peak 3.96     Symptoms No     Resting HR 65 bpm     Resting Oxygen Saturation  95 %     Exercise Oxygen Saturation  during 6 min walk 95 %     Max Ex. HR 92 bpm     Max Ex. BP 122/64        Oxygen Initial Assessment:   Oxygen Re-Evaluation:   Oxygen Discharge (Final Oxygen Re-Evaluation):   Initial Exercise Prescription:  Initial Exercise Prescription - 05/20/23 1500       Date of Initial Exercise RX and Referring Provider   Date 05/20/23    Referring Provider Dr. Denyse Bathe      Oxygen   Maintain Oxygen Saturation 88% or higher      Treadmill   MPH 1      NuStep   Level 4   T6: 3   Minutes 15    METs 2.3      T5 Nustep   Level 4    Minutes 15    METs 3      Track   Laps 15    Minutes  15    METs 1.82      Resistance Training   Training Prescription Yes    Weight 4 lb    Reps 10-15          Perform Capillary Blood Glucose checks as needed.  Exercise Prescription Changes:   Exercise Prescription Changes     Row Name 05/28/23 1500 06/12/23 1500 06/27/23 1600 07/10/23 0800 07/22/23 1600     Response to Exercise   Blood Pressure (Admit) 100/62 112/70 138/66 110/60 142/80   Blood Pressure (Exercise) -- 118/60 140/62 126/80 --   Blood Pressure (Exit) 112/64 98/54 118/60 110/58 132/70   Heart Rate (Admit) 65 bpm 78 bpm 63 bpm 77 bpm 77 bpm   Heart Rate (Exercise) 90 bpm 96 bpm 93 bpm 99 bpm 106 bpm   Heart Rate (Exit) 67 bpm 70 bpm 75 bpm 84 bpm 82 bpm   Rating of Perceived Exertion (Exercise) 13 15 13 17 15    Symptoms none none none none none   Comments First two days back in rehab -- -- -- --   Duration Continue with 30 min of aerobic exercise without signs/symptoms of physical distress. Continue with 30 min of aerobic exercise without signs/symptoms of physical distress. Continue with 30 min of aerobic exercise without signs/symptoms of physical distress. Continue with 30 min of aerobic exercise without signs/symptoms of physical distress. Continue with 30 min of aerobic exercise without signs/symptoms of physical distress.   Intensity THRR unchanged THRR unchanged THRR unchanged THRR unchanged THRR unchanged     Progression   Progression Continue to progress workloads to maintain intensity without signs/symptoms of physical distress. Continue to progress workloads to maintain intensity without signs/symptoms of physical distress. Continue to progress workloads to maintain intensity without signs/symptoms of physical distress. Continue to  progress workloads to maintain intensity without signs/symptoms of physical distress. Continue to progress workloads to maintain intensity without signs/symptoms of physical distress.   Average METs 2.27 2.09 2.14 1.61 1.82      Resistance Training   Training Prescription Yes Yes Yes Yes Yes   Weight 3 lb 3 lb 3 lb 5 lb 5 lb   Reps 10-15 10-15 10-15 10-15 10-15     Interval Training   Interval Training No No No No No     NuStep   Level 5 5  T6: 3 4 3 3   T6: level 3   Minutes 15 15 15 15 15    METs 2.4 3  T6: 2 2.4 1.5 1.7  T6: 1.9 METs     Arm Ergometer   Level -- -- -- 1 --   Minutes -- -- -- 15 --   METs -- -- -- 1 --     T5 Nustep   Level -- -- -- 3  T6 --   Minutes -- -- -- 15 --   METs -- -- -- 1.7 --     Track   Laps 15 25 22 17 17    Minutes 15 15 15 15 15    METs 1.82 2.36 2.2 1.92 1.92     Oxygen   Maintain Oxygen Saturation 88% or higher 88% or higher 88% or higher 88% or higher 88% or higher    Row Name 08/08/23 1700 09/04/23 1400 09/16/23 1600 10/03/23 1500       Response to Exercise   Blood Pressure (Admit) 112/70 110/60 106/60 106/60    Blood Pressure (Exit) 118/70 100/68 120/62 102/58    Heart Rate (Admit) 75 bpm 73 bpm 69 bpm 103 bpm    Heart Rate (Exercise) 100 bpm 101 bpm 82 bpm 103 bpm    Heart Rate (Exit) 79 bpm 73 bpm 73 bpm 77 bpm    Rating of Perceived Exertion (Exercise) 14 13 12 13     Symptoms none none none none    Duration Continue with 30 min of aerobic exercise without signs/symptoms of physical distress. Continue with 30 min of aerobic exercise without signs/symptoms of physical distress. Continue with 30 min of aerobic exercise without signs/symptoms of physical distress. Continue with 30 min of aerobic exercise without signs/symptoms of physical distress.    Intensity THRR unchanged THRR unchanged THRR unchanged THRR unchanged      Progression   Progression Continue to progress workloads to maintain intensity without signs/symptoms of physical distress. Continue to progress workloads to maintain intensity without signs/symptoms of physical distress. Continue to progress workloads to maintain intensity without signs/symptoms of physical distress. Continue to progress  workloads to maintain intensity without signs/symptoms of physical distress.    Average METs 1.78 1.84 2.04 2.19      Resistance Training   Training Prescription Yes Yes Yes Yes    Weight 5 lb 5 lb 5 lb 5 lb    Reps 10-15 10-15 10-15 10-15      Interval Training   Interval Training No No No No      NuStep   Level 3 3 4 5     Minutes 15 15 15 15     METs 2 2 2.3 3      T5 Nustep   Level 1  T6 -- -- --    Minutes 15 -- -- --    METs 1.5 -- -- --      Track   Laps 17 16 4  20  Minutes 15 15 15 15     METs 1.92 1.87 1.27 2.09      Oxygen   Maintain Oxygen Saturation 88% or higher 88% or higher 88% or higher 88% or higher       Exercise Comments:   Exercise Comments     Row Name 05/20/23 1537           Exercise Comments First full day of exercise!  Patient was oriented to gym and equipment including functions, settings, policies, and procedures.  Patient's individual exercise prescription and treatment plan were reviewed.  All starting workloads were established based on the results of the 6 minute walk test done at initial orientation visit.  The plan for exercise progression was also introduced and progression will be customized based on patient's performance and goals.          Exercise Goals and Review:   Exercise Goals     Row Name 05/20/23 1536             Exercise Goals   Increase Physical Activity Yes       Intervention Develop an individualized exercise prescription for aerobic and resistive training based on initial evaluation findings, risk stratification, comorbidities and participant's personal goals.;Provide advice, education, support and counseling about physical activity/exercise needs.       Expected Outcomes Long Term: Add in home exercise to make exercise part of routine and to increase amount of physical activity.;Long Term: Exercising regularly at least 3-5 days a week.;Short Term: Attend rehab on a regular basis to increase amount of physical  activity.       Able to understand and use rate of perceived exertion (RPE) scale Yes       Intervention Provide education and explanation on how to use RPE scale       Expected Outcomes Short Term: Able to use RPE daily in rehab to express subjective intensity level;Long Term:  Able to use RPE to guide intensity level when exercising independently       Able to understand and use Dyspnea scale Yes       Intervention Provide education and explanation on how to use Dyspnea scale       Expected Outcomes Short Term: Able to use Dyspnea scale daily in rehab to express subjective sense of shortness of breath during exertion;Long Term: Able to use Dyspnea scale to guide intensity level when exercising independently       Knowledge and understanding of Target Heart Rate Range (THRR) Yes       Intervention Provide education and explanation of THRR including how the numbers were predicted and where they are located for reference       Expected Outcomes Short Term: Able to state/look up THRR;Short Term: Able to use daily as guideline for intensity in rehab;Long Term: Able to use THRR to govern intensity when exercising independently       Able to check pulse independently Yes       Intervention Provide education and demonstration on how to check pulse in carotid and radial arteries.;Review the importance of being able to check your own pulse for safety during independent exercise       Expected Outcomes Short Term: Able to explain why pulse checking is important during independent exercise;Long Term: Able to check pulse independently and accurately       Understanding of Exercise Prescription Yes       Intervention Provide education, explanation, and written materials on patient's individual exercise prescription  Expected Outcomes Short Term: Able to explain program exercise prescription;Long Term: Able to explain home exercise prescription to exercise independently          Exercise Goals  Re-Evaluation :  Exercise Goals Re-Evaluation     Row Name 05/28/23 1518 06/12/23 1553 06/27/23 1633 07/10/23 0817 07/22/23 1620     Exercise Goal Re-Evaluation   Exercise Goals Review Increase Physical Activity;Increase Strength and Stamina;Understanding of Exercise Prescription Increase Physical Activity;Increase Strength and Stamina;Understanding of Exercise Prescription Increase Physical Activity;Increase Strength and Stamina;Understanding of Exercise Prescription Increase Physical Activity;Increase Strength and Stamina;Understanding of Exercise Prescription Increase Physical Activity;Increase Strength and Stamina;Understanding of Exercise Prescription   Comments Devin [name] did well in his first two sessions back in rehab since returning with a new referral. He worked at level 5 on the T4 nustep and was able to walk 15 laps on the track. He also did well with 3 lb hand weights for resistance training. We will continue to monitor his progress in the program. Devin [name] is doing well in rehab. He increased to 25 laps walked on the track and continues to use 3 lb hand weights for resistance training. He also has stayed consistent at level 3 on the T6 and level 5 on the T4 nustep. We will continue to monitor his progress in the program. Devin [name] continues to do well in rehab. He was unable to make any workload increases during this review, but was however able to maintain his intensity on both the track and the T4 nustep. We will continue to monitor his progress in the program. Devin [name] is doing well in rehab. He increased to 5lb handweights for resistance. He maintained level 3 on the T6 and level 1 on the arm ergometer. He decreased to level 3 on the T4 from level 4. He also decreased his laps to 17 on the track from 22. We will continue to monitor his progress in the program. Devin [name] is doing well in rehab. He maintained his workloads at level 3 on both the T6 and T4 nustep machines. He also continues to walk 17 laps on the track. We will  continue to monitor his progress in the program.   Expected Outcomes Short: Continue to follow current exercise prescription. Long: Continue exercise to improve strength and stamina. Short: Continue to push for more laps on the track. Long: Continue exercise to improve strength and stamina. Short: Continue to push for more track laps, increase to level 5 on the T4 nustep. Long: Continue exercise to improve strength and stamina. Short: Push for more laps on the track to get back to 22 laps and try level 4 on the T4 and T6 nusteps. Long: Continue exercise to improve strength and stamina. Short: Continue to push for more laps on the track. Long: Continue exercise to improve strength and stamina.    Row Name 08/08/23 1742 08/22/23 1758 09/04/23 1456 09/16/23 1615 10/03/23 1559     Exercise Goal Re-Evaluation   Exercise Goals Review Increase Physical Activity;Increase Strength and Stamina;Understanding of Exercise Prescription Increase Physical Activity;Increase Strength and Stamina;Understanding of Exercise Prescription Increase Physical Activity;Increase Strength and Stamina;Understanding of Exercise Prescription Increase Physical Activity;Increase Strength and Stamina;Understanding of Exercise Prescription Increase Physical Activity;Increase Strength and Stamina;Understanding of Exercise Prescription   Comments Devin [name] is doing well in rehab. He maintained walking 17 laps on the track and his workload at level 3 on T4 nustep. He decreased to level 1 on the T6 nustep from level 3. We will continue to monitor his progress  in the program. Devin [name] has not attended since 5/22 as he has been out sick with pneumonia. He plans to return the week of 6/9. We will continue to monitor his progress when he returns. Devin [name] returned to rehab in this review after being out sick with pneumonia. Devin [name] is doing well in rehab. He maintained level 3 on the T4 nustep. He decreased his laps by 1 to 16 laps on the track. We will continue to monitor  his progress in the program. Devin [name] has done well in rehab. He was able to increase from level 3 to 4 on the T4 nustep. He was also able to walk the track, but was only able to walk 4 laps. We will continue to monitor his progress in the program. Devin [name] is doing well in rehab. He was able to increase from level 4 to level 5 on the T4 nustep. He was also able to walk the track, and increased up to 20 laps. We will continue to monitor his progress in the program.   Expected Outcomes Short: Increase back to level 3 on the T6 nustep and push for more laps on the track. Long: Continue exercise to improve strength and stamina. Short: Return to rehab once recovered. Long: Continue exercise to improve strength and stamina. Short: Try level 4 on the T4 nustep and get back to 17 laps on the track. Long: Continue exercise to improve strength and stamina. Short: Return to 17 laps on track. Long: Continue exercise to improve strength and stamina. Short: Continue to progressively increase workloads. Long: Continue exercise to improve strength and stamina.    Row Name 10/07/23 1647             Exercise Goal Re-Evaluation   Exercise Goals Review Increase Physical Activity;Understanding of Exercise Prescription;Increase Strength and Stamina       Comments Devin [name] is not doing much currently with keeping up with his exercise at home. He was walking the park about 2-3 blocks in the evening, but it is currently too hot outside. We discussed indoor options to keep up with exercise on these hot days at home and he plans to walk the aisles of [employer] as it is a similar distance.       Expected Outcomes Short: Get back to exercising at home. Long: Continue exercising at home to improve strength and stamina.          Discharge Exercise Prescription (Final Exercise Prescription Changes):  Exercise Prescription Changes - 10/03/23 1500       Response to Exercise   Blood Pressure (Admit) 106/60    Blood Pressure (Exit) 102/58    Heart  Rate (Admit) 103 bpm    Heart Rate (Exercise) 103 bpm    Heart Rate (Exit) 77 bpm    Rating of Perceived Exertion (Exercise) 13    Symptoms none    Duration Continue with 30 min of aerobic exercise without signs/symptoms of physical distress.    Intensity THRR unchanged      Progression   Progression Continue to progress workloads to maintain intensity without signs/symptoms of physical distress.    Average METs 2.19      Resistance Training   Training Prescription Yes    Weight 5 lb    Reps 10-15      Interval Training   Interval Training No      NuStep   Level 5    Minutes 15    METs 3      Track  Laps 20    Minutes 15    METs 2.09      Oxygen   Maintain Oxygen Saturation 88% or higher          Nutrition:  Target Goals: Understanding of nutrition guidelines, daily intake of sodium 1500mg , cholesterol 200mg , calories 30% from fat and 7% or less from saturated fats, daily to have 5 or more servings of fruits and vegetables.  Education: All About Nutrition: -Group instruction provided by verbal, written material, interactive activities, discussions, models, and posters to present general guidelines for heart healthy nutrition including fat, fiber, MyPlate, the role of sodium in heart healthy nutrition, utilization of the nutrition label, and utilization of this knowledge for meal planning. Follow up email sent as well. Written material given at graduation. Flowsheet Row Cardiac Rehab from 11/19/2022 in [facility] Cardiac and Pulmonary Rehab  Education need identified 11/19/22    Biometrics:    Nutrition Therapy Plan and Nutrition Goals:  Nutrition Therapy & Goals - 10/07/23 1653       Nutrition Therapy   RD appointment deferred --          Nutrition Assessments:  MEDIFICTS Score Key: >=70 Need to make dietary changes  40-70 Heart Healthy Diet <= 40 Therapeutic Level Cholesterol Diet  Flowsheet Row Cardiac Rehab from 05/20/2023 in [facility] Cardiac and Pulmonary  Rehab  Picture Your Plate Total Score on Admission 64   Picture Your Plate Scores: <59 Unhealthy dietary pattern with much room for improvement. 41-50 Dietary pattern unlikely to meet recommendations for good health and room for improvement. 51-60 More healthful dietary pattern, with some room for improvement.  >60 Healthy dietary pattern, although there may be some specific behaviors that could be improved.    Nutrition Goals Re-Evaluation:  Nutrition Goals Re-Evaluation     Row Name 06/13/23 1603 07/11/23 1601 10/07/23 1653         Goals   Comment Patient deferred RD appointment. Patient deferred RD appointment. Patient deferred RD appointment.        Nutrition Goals Discharge (Final Nutrition Goals Re-Evaluation):  Nutrition Goals Re-Evaluation - 10/07/23 1653       Goals   Comment Patient deferred RD appointment.          Psychosocial: Target Goals: Acknowledge presence or absence of significant depression and/or stress, maximize coping skills, provide positive support system. Participant is able to verbalize types and ability to use techniques and skills needed for reducing stress and depression.   Education: Stress, Anxiety, and Depression - Group verbal and visual presentation to define topics covered.  Reviews how body is impacted by stress, anxiety, and depression.  Also discusses healthy ways to reduce stress and to treat/manage anxiety and depression.  Written material given at graduation.   Education: Sleep Hygiene -Provides group verbal and written instruction about how sleep can affect your health.  Define sleep hygiene, discuss sleep cycles and impact of sleep habits. Review good sleep hygiene tips.    Initial Review & Psychosocial Screening:  Initial Psych Review & Screening - 05/20/23 1520       Initial Review   Current issues with None Identified      Family Dynamics   Good Support System? Yes      Barriers   Psychosocial barriers to  participate in program There are no identifiable barriers or psychosocial needs.      Screening Interventions   Interventions Encouraged to exercise;To provide support and resources with identified psychosocial needs;Provide feedback about the scores  to participant    Expected Outcomes Short Term goal: Utilizing psychosocial counselor, staff and physician to assist with identification of specific Stressors or current issues interfering with healing process. Setting desired goal for each stressor or current issue identified.;Long Term Goal: Stressors or current issues are controlled or eliminated.;Short Term goal: Identification and review with participant of any Quality of Life or Depression concerns found by scoring the questionnaire.;Long Term goal: The participant improves quality of Life and PHQ9 Scores as seen by post scores and/or verbalization of changes          Quality of Life Scores:   Quality of Life - 05/20/23 1523       Quality of Life   Select Quality of Life      Quality of Life Scores   Health/Function Pre 27.6 %    Socioeconomic Pre 28 %    Psych/Spiritual Pre 30 %    Family Pre 27 %    GLOBAL Pre 28.13 %         Scores of 19 and below usually indicate a poorer quality of life in these areas.  A difference of  2-3 points is a clinically meaningful difference.  A difference of 2-3 points in the total score of the Quality of Life Index has been associated with significant improvement in overall quality of life, self-image, physical symptoms, and general health in studies assessing change in quality of life.  PHQ-9: Review Flowsheet  More data exists      06/13/2023 05/16/2023 11/19/2022 01/12/2020 10/28/2019  Depression screen PHQ 2/9  Decreased Interest 0 0 1 3 2   Down, Depressed, Hopeless 0 0 1 0 0  PHQ - 2 Score 0 0 2 3 2   Altered sleeping 0 0 1 1 3   Tired, decreased energy 1 3 1 2 2   Change in appetite 1 3 1 1 1   Feeling bad or failure about yourself  0 0 0 0 0   Trouble concentrating 0 0 0 0 1  Moving slowly or fidgety/restless 0 0 0 0 1  Suicidal thoughts 0 0 0 1 0  PHQ-9 Score 2 6 5 8 10   Difficult doing work/chores Not difficult at all Not difficult at all Somewhat difficult Not difficult at all Somewhat difficult   Interpretation of Total Score  Total Score Depression Severity:  1-4 = Minimal depression, 5-9 = Mild depression, 10-14 = Moderate depression, 15-19 = Moderately severe depression, 20-27 = Severe depression   Psychosocial Evaluation and Intervention:  Psychosocial Evaluation - 05/20/23 1520       Psychosocial Evaluation & Interventions   Interventions Encouraged to exercise with the program and follow exercise prescription    Comments Devin [name] is returning to the program after another stent placement. He is motivated to attend the program and has no barriers to attending. He has completed PT for balance and plans to increase his stamina in the program. He relies on his lady friend for support and company    Expected Outcomes Short: attend cardiac rehab for education and exercise Long: develop and maintain positive self care habits    Continue Psychosocial Services  Follow up required by staff          Psychosocial Re-Evaluation:  Psychosocial Re-Evaluation     Row Name 06/13/23 1557 07/11/23 1549 10/07/23 1650         Psychosocial Re-Evaluation   Current issues with Current Psychotropic Meds Current Psychotropic Meds Current Psychotropic Meds;Current Sleep Concerns     Comments Reviewed  patient health questionnaire (PHQ-9) with patient for follow up. Previously, patients score indicated signs/symptoms of depression.  Reviewed to see if patient is improving symptom wise while in program.  Score improved and patient states that it is because he is able to exercise and have more energy. Devin [name] states that he is still taking his anxiety/depression medication. He also states that he is sleeping well, and is not having any stressors at this  time. He can look to his lady friend when he is having stress, as well as exercise. Devin [name] stays no current stressors other than trying to figure out how to get his home exercise in and we discussed indoor options and to schedule it in his day. He is still taking his anxiety/depression medications. He is still not sleeping well and it is getting worse. He has tried different relaxation methods to try and help him fall asleep. He can look to his lady friend for support has help when he is having stress.     Expected Outcomes Short: Continue to attend [facility] regularly for regular exercise and social engagement. Long: Continue to improve symptoms and manage a positive mental state. Short: Continue to attend [facility] regularly for regular exercise and social engagement. Long: Continue to improve symptoms and manage a positive mental state. Short: Continue to attend [facility] regularly for regular exercise and social engagement and exercise at home. Long: Continue to improve symptoms and manage a positive mental state.     Interventions Encouraged to attend Cardiac Rehabilitation for the exercise Encouraged to attend Cardiac Rehabilitation for the exercise Encouraged to attend Cardiac Rehabilitation for the exercise     Continue Psychosocial Services  Follow up required by staff Follow up required by staff Follow up required by staff        Psychosocial Discharge (Final Psychosocial Re-Evaluation):  Psychosocial Re-Evaluation - 10/07/23 1650       Psychosocial Re-Evaluation   Current issues with Current Psychotropic Meds;Current Sleep Concerns    Comments Devin [name] stays no current stressors other than trying to figure out how to get his home exercise in and we discussed indoor options and to schedule it in his day. He is still taking his anxiety/depression medications. He is still not sleeping well and it is getting worse. He has tried different relaxation methods to try and help him fall asleep. He can  look to his lady friend for support has help when he is having stress.    Expected Outcomes Short: Continue to attend [facility] regularly for regular exercise and social engagement and exercise at home. Long: Continue to improve symptoms and manage a positive mental state.    Interventions Encouraged to attend Cardiac Rehabilitation for the exercise    Continue Psychosocial Services  Follow up required by staff          Vocational Rehabilitation: Provide vocational rehab assistance to qualifying candidates.   Vocational Rehab Evaluation & Intervention:  Vocational Rehab - 05/20/23 1519       Initial Vocational Rehab Evaluation & Intervention   Assessment shows need for Vocational Rehabilitation No          Education: Education Goals: Education classes will be provided on a variety of topics geared toward better understanding of heart health and risk factor modification. Participant will state understanding/return demonstration of topics presented as noted by education test scores.  Learning Barriers/Preferences:   General Cardiac Education Topics:  AED/CPR: - Group verbal and written instruction with the use of models to demonstrate the basic use  of the AED with the basic ABC's of resuscitation.   Anatomy and Cardiac Procedures: - Group verbal and visual presentation and models provide information about basic cardiac anatomy and function. Reviews the testing methods done to diagnose heart disease and the outcomes of the test results. Describes the treatment choices: Medical Management, Angioplasty, or Coronary Bypass Surgery for treating various heart conditions including Myocardial Infarction, Angina, Valve Disease, and Cardiac Arrhythmias.  Written material given at graduation. Flowsheet Row Cardiac Rehab from 11/19/2022 in [facility] Cardiac and Pulmonary Rehab  Education need identified 11/19/22    Medication Safety: - Group verbal and visual instruction to review commonly  prescribed medications for heart and lung disease. Reviews the medication, class of the drug, and side effects. Includes the steps to properly store meds and maintain the prescription regimen.  Written material given at graduation. Flowsheet Row Cardiac Rehab from 10/07/2019 in [facility] Cardiac and Pulmonary Rehab  Date 10/07/19  Educator SB  Instruction Review Code 1- Verbalizes Understanding    Intimacy: - Group verbal instruction through game format to discuss how heart and lung disease can affect sexual intimacy. Written material given at graduation..   Know Your Numbers and Heart Failure: - Group verbal and visual instruction to discuss disease risk factors for cardiac and pulmonary disease and treatment options.  Reviews associated critical values for Overweight/Obesity, Hypertension, Cholesterol, and Diabetes.  Discusses basics of heart failure: signs/symptoms and treatments.  Introduces Heart Failure Zone chart for action plan for heart failure.  Written material given at graduation.   Infection Prevention: - Provides verbal and written material to individual with discussion of infection control including proper hand washing and proper equipment cleaning during exercise session. Flowsheet Row Cardiac Rehab from 11/19/2022 in [facility] Cardiac and Pulmonary Rehab  Date 11/19/22  Educator NT  Instruction Review Code 1- Verbalizes Understanding    Falls Prevention: - Provides verbal and written material to individual with discussion of falls prevention and safety. Flowsheet Row Cardiac Rehab from 11/19/2022 in [facility] Cardiac and Pulmonary Rehab  Date 11/19/22  Educator NT  Instruction Review Code 1- Verbalizes Understanding    Other: -Provides group and verbal instruction on various topics (see comments)   Knowledge Questionnaire Score:  Knowledge Questionnaire Score - 05/20/23 1519       Knowledge Questionnaire Score   Pre Score 24/26          Core Components/Risk Factors/Patient  Goals at Admission:  Personal Goals and Risk Factors at Admission - 05/20/23 1520       Core Components/Risk Factors/Patient Goals on Admission    Weight Management Yes;Weight Maintenance    Intervention Weight Management: Develop a combined nutrition and exercise program designed to reach desired caloric intake, while maintaining appropriate intake of nutrient and fiber, sodium and fats, and appropriate energy expenditure required for the weight goal.;Weight Management: Provide education and appropriate resources to help participant work on and attain dietary goals.    Expected Outcomes Short Term: Continue to assess and modify interventions until short term weight is achieved;Long Term: Adherence to nutrition and physical activity/exercise program aimed toward attainment of established weight goal;Weight Maintenance: Understanding of the daily nutrition guidelines, which includes 25-35% calories from fat, 7% or less cal from saturated fats, less than 200mg  cholesterol, less than 1.5gm of sodium, & 5 or more servings of fruits and vegetables daily    Diabetes Yes    Intervention Provide education about signs/symptoms and action to take for hypo/hyperglycemia.;Provide education about proper nutrition, including hydration, and aerobic/resistive  exercise prescription along with prescribed medications to achieve blood glucose in normal ranges: Fasting glucose 65-99 mg/dL    Expected Outcomes Short Term: Participant verbalizes understanding of the signs/symptoms and immediate care of hyper/hypoglycemia, proper foot care and importance of medication, aerobic/resistive exercise and nutrition plan for blood glucose control.;Long Term: Attainment of HbA1C < 7%.    Heart Failure Yes    Intervention Provide a combined exercise and nutrition program that is supplemented with education, support and counseling about heart failure. Directed toward relieving symptoms such as shortness of breath, decreased exercise  tolerance, and extremity edema.    Expected Outcomes Improve functional capacity of life;Short term: Attendance in program 2-3 days a week with increased exercise capacity. Reported lower sodium intake. Reported increased fruit and vegetable intake. Reports medication compliance.;Short term: Daily weights obtained and reported for increase. Utilizing diuretic protocols set by physician.;Long term: Adoption of self-care skills and reduction of barriers for early signs and symptoms recognition and intervention leading to self-care maintenance.    Hypertension Yes    Intervention Provide education on lifestyle modifcations including regular physical activity/exercise, weight management, moderate sodium restriction and increased consumption of fresh fruit, vegetables, and low fat dairy, alcohol moderation, and smoking cessation.;Monitor prescription use compliance.    Expected Outcomes Short Term: Continued assessment and intervention until BP is < 140/70mm HG in hypertensive participants. < 130/85mm HG in hypertensive participants with diabetes, heart failure or chronic kidney disease.;Long Term: Maintenance of blood pressure at goal levels.    Lipids Yes    Intervention Provide education and support for participant on nutrition & aerobic/resistive exercise along with prescribed medications to achieve LDL 70mg , HDL >40mg .    Expected Outcomes Short Term: Participant states understanding of desired cholesterol values and is compliant with medications prescribed. Participant is following exercise prescription and nutrition guidelines.;Long Term: Cholesterol controlled with medications as prescribed, with individualized exercise RX and with personalized nutrition plan. Value goals: LDL < 70mg , HDL > 40 mg.          Education:Diabetes - Individual verbal and written instruction to review signs/symptoms of diabetes, desired ranges of glucose level fasting, after meals and with exercise. Acknowledge that pre  and post exercise glucose checks will be done for 3 sessions at entry of program. Flowsheet Row Cardiac Rehab from 01/13/2020 in [facility] Cardiac and Pulmonary Rehab  Date 01/13/20  Educator [facility]  Instruction Review Code 1- Verbalizes Understanding    Core Components/Risk Factors/Patient Goals Review:   Goals and Risk Factor Review     Row Name 06/13/23 1551 07/11/23 1603 10/07/23 1654         Core Components/Risk Factors/Patient Goals Review   Personal Goals Review Weight Management/Obesity Weight Management/Obesity Weight Management/Obesity;Hypertension;Diabetes     Review Devin [name] is continuing the program and is working toward losing weight. He is 224 pounds currently. and wants to reach a weight goal of 200 pounds. He is going to work toward losing a few pounds. Devin [name] continues to try to lose some weight. He currently weighed in at 219.7lbs today and would like to get down to a target weight of 200lbs. Devin [name] is still working on his weight goal. He is up at little from his goal weight and weighed 217.2 lbs today. He stopped taking Ozempic. He is monitoring his blood pressure at home a couple times a week. He is not currently monitoring his blood sugar because he can't find a finger prick that will get him enough blood to test it. He asked his doctor  about getting a monitor/patch like a dexcom, but is finding out if his insurance will cover it.     Expected Outcomes Short: lose some weight. Long: reach weight goal. Short: Lose 5 lbs. Long: Continue to exercise and diet in order to lose some weight. Short: Get a new monitor at home to properly monitor blood sugar. Long: Continue to monitor blood pressure and blood glucose at home.        Core Components/Risk Factors/Patient Goals at Discharge (Final Review):   Goals and Risk Factor Review - 10/07/23 1654       Core Components/Risk Factors/Patient Goals Review   Personal Goals Review Weight Management/Obesity;Hypertension;Diabetes    Review Devin [name] is still  working on his weight goal. He is up at little from his goal weight and weighed 217.2 lbs today. He stopped taking Ozempic. He is monitoring his blood pressure at home a couple times a week. He is not currently monitoring his blood sugar because he can't find a finger prick that will get him enough blood to test it. He asked his doctor about getting a monitor/patch like a dexcom, but is finding out if his insurance will cover it.    Expected Outcomes Short: Get a new monitor at home to properly monitor blood sugar. Long: Continue to monitor blood pressure and blood glucose at home.          ITP Comments:  ITP Comments     Row Name 05/20/23 1537 05/22/23 0754 06/19/23 1206 07/17/23 1343 08/14/23 1023   ITP Comments First full day of exercise!  Patient was oriented to gym and equipment including functions, settings, policies, and procedures.  Patient's individual exercise prescription and treatment plan were reviewed.  All starting workloads were established based on the results of the 6 minute walk test done at initial orientation visit.  The plan for exercise progression was also introduced and progression will be customized based on patient's performance and goals. 30 Day review completed. Medical Director ITP review done, changes made as directed, and signed approval by Medical Director. New patient new diagnosis. 30 Day review completed. Medical Director ITP review done, changes made as directed, and signed approval by Medical Director. 30 Day review completed. Medical Director ITP review done, changes made as directed, and signed approval by Medical Director. 30 Day review completed. Medical Director ITP review done, changes made as directed, and signed approval by Medical Director.    Row Name 09/11/23 1222 10/09/23 0945         ITP Comments 30 Day review completed. Medical Director ITP review done, changes made as directed, and signed approval by Medical Director. 30 Day review completed.  Medical Director ITP review done, changes made as directed, and signed approval by Medical Director.         Comments: 30 day review

## 2023-10-10 ENCOUNTER — Encounter: Admitting: *Deleted

## 2023-10-10 ENCOUNTER — Ambulatory Visit: Admitting: Cardiovascular Disease

## 2023-10-10 DIAGNOSIS — Z955 Presence of coronary angioplasty implant and graft: Secondary | ICD-10-CM

## 2023-10-10 NOTE — Progress Notes (Signed)
 Daily Session Note  Patient Details  Name: Devin [name] [facility]. MRN: [id number] Date of Birth: 1945-10-18 Referring Provider:   Flowsheet Row Cardiac Rehab from 05/20/2023 in [facility] Cardiac and Pulmonary Rehab  Referring Provider Dr. Denyse Bathe    Encounter Date: 10/10/2023  Check In:  Session Check In - 10/10/23 1524       Check-In   Supervising physician immediately available to respond to emergencies [name] telemetry face sheet for immediately available ER MD    Location [facility]-Cardiac & Pulmonary Rehab    Staff Present Hoy Rodney RN,BSN;Joseph [facility] BS, Exercise Physiologist;Noah Tickle, BS, Exercise Physiologist    Virtual Visit No    Medication changes reported     No    Fall or balance concerns reported    No    Warm-up and Cool-down Performed on first and last piece of equipment    Resistance Training Performed Yes    VAD Patient? No      Pain Assessment   Currently in Pain? No/denies             Social History   Tobacco Use  Smoking Status Former  Smokeless Tobacco Never  Tobacco Comments   Quit over 40 years ago    Goals Met:  Independence with exercise equipment Exercise tolerated well No report of concerns or symptoms today Strength training completed today  Goals Unmet:  Not Applicable  Comments: Pt able to follow exercise prescription today without complaint.  Will continue to monitor for progression.    Dr. Oneil Pinal is Medical Director for [facility] Cardiac Rehabilitation.  Dr. Fuad Aleskerov is Medical Director for [facility] Pulmonary Rehabilitation.

## 2023-10-14 ENCOUNTER — Encounter: Attending: Cardiovascular Disease | Admitting: *Deleted

## 2023-10-14 DIAGNOSIS — I252 Old myocardial infarction: Secondary | ICD-10-CM | POA: Diagnosis not present

## 2023-10-14 DIAGNOSIS — Z955 Presence of coronary angioplasty implant and graft: Secondary | ICD-10-CM | POA: Diagnosis present

## 2023-10-14 DIAGNOSIS — I214 Non-ST elevation (NSTEMI) myocardial infarction: Secondary | ICD-10-CM | POA: Diagnosis present

## 2023-10-14 DIAGNOSIS — Z48812 Encounter for surgical aftercare following surgery on the circulatory system: Secondary | ICD-10-CM | POA: Insufficient documentation

## 2023-10-14 NOTE — Progress Notes (Signed)
 Daily Session Note  Patient Details  Name: Devin [name]. MRN: [id number] Date of Birth: 13-May-1945 Referring Provider:   Flowsheet Row Cardiac Rehab from 05/20/2023 in [facility] Cardiac and Pulmonary Rehab  Referring Provider Dr. Denyse Bathe    Encounter Date: 10/14/2023  Check In:  Session Check In - 10/14/23 1619       Check-In   Supervising physician immediately available to respond to emergencies See telemetry face sheet for immediately available ER MD    Location [facility]-Cardiac & Pulmonary Rehab    Staff Present Rollene Paterson, MS, Exercise Physiologist;Laureen Delores, BS, RRT, CPFT;Khamiya Varin Tressa RN,BSN;Joseph Rolinda RCP,RRT,BSRT    Virtual Visit No    Medication changes reported     No    Fall or balance concerns reported    No    Warm-up and Cool-down Performed on first and last piece of equipment    Resistance Training Performed Yes    VAD Patient? No    PAD/SET Patient? No      Pain Assessment   Currently in Pain? No/denies             Social History   Tobacco Use  Smoking Status Former  Smokeless Tobacco Never  Tobacco Comments   Quit over 40 years ago    Goals Met:  Independence with exercise equipment Exercise tolerated well No report of concerns or symptoms today Strength training completed today  Goals Unmet:  Not Applicable  Comments: Pt able to follow exercise prescription today without complaint.  Will continue to monitor for progression.    Dr. Oneil Pinal is Medical Director for [facility] Cardiac Rehabilitation.  Dr. Fuad Aleskerov is Medical Director for [facility] Pulmonary Rehabilitation.

## 2023-10-17 ENCOUNTER — Encounter: Admitting: *Deleted

## 2023-10-17 VITALS — Ht 71.5 in | Wt 216.4 lb

## 2023-10-17 DIAGNOSIS — I214 Non-ST elevation (NSTEMI) myocardial infarction: Secondary | ICD-10-CM

## 2023-10-17 DIAGNOSIS — Z955 Presence of coronary angioplasty implant and graft: Secondary | ICD-10-CM

## 2023-10-17 NOTE — Progress Notes (Signed)
 Daily Session Note  Patient Details  Name: Brazen Domangue [facility]. MRN: [id number] Date of Birth: 05-Mar-1946 Referring Provider:   Flowsheet Row Cardiac Rehab from 05/20/2023 in [facility] Cardiac and Pulmonary Rehab  Referring Provider Dr. Denyse Bathe    Encounter Date: 10/17/2023  Check In:  Session Check In - 10/17/23 1540       Check-In   Supervising physician immediately available to respond to emergencies See telemetry face sheet for immediately available ER MD    Location [facility]-Cardiac & Pulmonary Rehab    Staff Present Fairy Plater RCP,RRT,BSRT;Nakaiya Beddow Tressa RN,BSN;Noah Tickle, BS, Exercise Physiologist;Maxon Conetta BS, Exercise Physiologist    Virtual Visit No    Medication changes reported     No    Fall or balance concerns reported    No    Warm-up and Cool-down Performed on first and last piece of equipment    Resistance Training Performed Yes    VAD Patient? No    PAD/SET Patient? No      Pain Assessment   Currently in Pain? No/denies             Social History   Tobacco Use  Smoking Status Former  Smokeless Tobacco Never  Tobacco Comments   Quit over 40 years ago    Goals Met:  Independence with exercise equipment Exercise tolerated well No report of concerns or symptoms today Strength training completed today  Goals Unmet:  Not Applicable  Comments: Pt able to follow exercise prescription today without complaint.  Will continue to monitor for progression.    Dr. Oneil Pinal is Medical Director for [facility] Cardiac Rehabilitation.  Dr. Fuad Aleskerov is Medical Director for [facility] Pulmonary Rehabilitation.

## 2023-10-17 NOTE — Patient Instructions (Signed)
 Discharge Patient Instructions  Patient Details  Name: Devin [name] [facility]. MRN: [id number] Date of Birth: [birth year] Referring Provider:  Eliverto Bette Hover, *   Number of Visits: 36  Reason for Discharge:  Patient reached a stable level of exercise. Patient independent in their exercise. Patient has met program and personal goals.  Diagnosis:  Status post coronary artery stent placement  NSTEMI (non-ST elevation myocardial infarction) [facility])  Initial Exercise Prescription:  Initial Exercise Prescription - 05/20/23 1500       Date of Initial Exercise RX and Referring Provider   Date 05/20/23    Referring Provider Dr. Denyse Bathe      Oxygen   Maintain Oxygen Saturation 88% or higher      Treadmill   MPH 1      NuStep   Level 4   T6: 3   Minutes 15    METs 2.3      T5 Nustep   Level 4    Minutes 15    METs 3      Track   Laps 15    Minutes 15    METs 1.82      Resistance Training   Training Prescription Yes    Weight 4 lb    Reps 10-15          Discharge Exercise Prescription (Final Exercise Prescription Changes):  Exercise Prescription Changes - 10/15/23 1400       Response to Exercise   Blood Pressure (Admit) 100/60    Blood Pressure (Exit) 116/58    Heart Rate (Admit) 84 bpm    Heart Rate (Exercise) 92 bpm    Heart Rate (Exit) 66 bpm    Rating of Perceived Exertion (Exercise) 14    Symptoms none    Duration Continue with 30 min of aerobic exercise without signs/symptoms of physical distress.    Intensity THRR unchanged      Progression   Progression Continue to progress workloads to maintain intensity without signs/symptoms of physical distress.    Average METs 1.92      Resistance Training   Training Prescription Yes    Weight 5 lb    Reps 10-15      Interval Training   Interval Training No      NuStep   Level 5    Minutes 15    METs 1.9      Track   Laps 18    Minutes 15    METs 1.98      Oxygen   Maintain  Oxygen Saturation 88% or higher          Functional Capacity:  Nutrition & Weight - Outcomes:   Post Biometrics - 10/17/23 1605        Post  Biometrics   Height 5' 11.5 (1.816 m)    Weight 216 lb 6.4 oz (98.2 kg)    Waist Circumference 44.5 inches    Hip Circumference 42.5 inches    Waist to Hip Ratio 1.05 %    BMI (Calculated) 29.76    Single Leg Stand 1.4 seconds          Nutrition:  Nutrition Therapy & Goals - 10/07/23 1653       Nutrition Therapy   RD appointment deferred --          Nutrition Discharge:   Education Questionnaire Score:  Knowledge Questionnaire Score - 05/20/23 1519       Knowledge Questionnaire Score   Pre Score 24/26  Goals reviewed with patient; copy given to patient.

## 2023-10-21 ENCOUNTER — Encounter

## 2023-10-24 ENCOUNTER — Encounter: Payer: Self-pay | Admitting: Cardiovascular Disease

## 2023-10-24 ENCOUNTER — Encounter: Admitting: Emergency Medicine

## 2023-10-24 ENCOUNTER — Ambulatory Visit: Admitting: Cardiovascular Disease

## 2023-10-24 VITALS — BP 120/68 | HR 69 | Ht 72.0 in | Wt 218.2 lb

## 2023-10-24 DIAGNOSIS — Z0130 Encounter for examination of blood pressure without abnormal findings: Secondary | ICD-10-CM

## 2023-10-24 DIAGNOSIS — I4891 Unspecified atrial fibrillation: Secondary | ICD-10-CM

## 2023-10-24 DIAGNOSIS — I5020 Unspecified systolic (congestive) heart failure: Secondary | ICD-10-CM

## 2023-10-24 DIAGNOSIS — R0602 Shortness of breath: Secondary | ICD-10-CM

## 2023-10-24 DIAGNOSIS — R0789 Other chest pain: Secondary | ICD-10-CM

## 2023-10-24 DIAGNOSIS — I2510 Atherosclerotic heart disease of native coronary artery without angina pectoris: Secondary | ICD-10-CM

## 2023-10-24 DIAGNOSIS — I25700 Atherosclerosis of coronary artery bypass graft(s), unspecified, with unstable angina pectoris: Secondary | ICD-10-CM

## 2023-10-24 DIAGNOSIS — Z955 Presence of coronary angioplasty implant and graft: Secondary | ICD-10-CM

## 2023-10-24 MED ORDER — ISOSORBIDE MONONITRATE ER 30 MG PO TB24: 30.0000 mg | ORAL_TABLET | Freq: Every day | ORAL | 1 refills | Status: DC

## 2023-10-24 MED ORDER — RANOLAZINE ER 1000 MG PO TB12: 1000.0000 mg | ORAL_TABLET | Freq: Two times a day (BID) | ORAL | 2 refills | Status: DC

## 2023-10-24 NOTE — Progress Notes (Signed)
 Daily Session Note  Patient Details  Name: Helmer Dull [facility]. MRN: [id number] Date of Birth: March 16, 1945 Referring Provider:   Flowsheet Row Cardiac Rehab from 05/20/2023 in [facility] Cardiac and Pulmonary Rehab  Referring Provider Dr. Denyse Bathe    Encounter Date: 10/24/2023  Check In:  Session Check In - 10/24/23 1618       Check-In   Supervising physician immediately available to respond to emergencies See telemetry face sheet for immediately available ER MD    Location [facility]-Cardiac & Pulmonary Rehab    Staff Present Devaughn Jaeger, BS, Exercise Physiologist;Lauren Vivek Grealish RN,BSN;Joseph Hood RCP,RRT,BSRT;Maxon Graeagle BS, Exercise Physiologist    Virtual Visit No    Medication changes reported     No    Fall or balance concerns reported    Yes    Comments no reported injury, generalized soreness    Tobacco Cessation No Change    Warm-up and Cool-down Performed on first and last piece of equipment    Resistance Training Performed Yes    VAD Patient? No    PAD/SET Patient? No      Pain Assessment   Currently in Pain? No/denies             Social History   Tobacco Use  Smoking Status Former  Smokeless Tobacco Never  Tobacco Comments   Quit over 40 years ago    Goals Met:  Independence with exercise equipment Exercise tolerated well No report of concerns or symptoms today Strength training completed today  Goals Unmet:  Not Applicable  Comments: Pt able to follow exercise prescription today without complaint.  Will continue to monitor for progression.    Dr. Oneil Pinal is Medical Director for [facility] Cardiac Rehabilitation.  Dr. Fuad Aleskerov is Medical Director for [facility] Pulmonary Rehabilitation.

## 2023-10-24 NOTE — Progress Notes (Signed)
 Cardiology Office Note   Date:  10/24/2023   ID:  Devin [name]., DOB 09/06/1945, MRN [id number]  PCP:  Eliverto Bette Hover, MD  Cardiologist:  Denyse Bathe, MD      History of Present Illness: Devin [name]. is a 78 y.o. male who presents for  Chief Complaint  Patient presents with   Follow-up    3 month follow up     Had chest pain for 2 minutes today this morning.  Chest Pain  This is a new problem. The current episode started today. The onset quality is sudden. The problem occurs 2 to 4 times per day.      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   Hypertension    Ischemic cardiomyopathy    MI, old    Peripheral neuropathy    PFO (patent foramen ovale) 05/2019   s/p repair   Sleep apnea    Spinal stenosis of lumbar region    TIA (transient ischemic attack)      Past Surgical History:  Procedure Laterality Date   APPENDECTOMY     BREAST SURGERY Right    benign mass   CARDIOVERSION Right 12/15/2019   Procedure: CARDIOVERSION;  Surgeon: Bathe Denyse LABOR, MD;  Location: [facility] ORS;  Service: Cardiovascular;  Laterality: Right;   CATARACT EXTRACTION, BILATERAL     COLONOSCOPY     CORONARY ANGIOPLASTY WITH STENT PLACEMENT     CORONARY ARTERY BYPASS GRAFT  05/2019   LIMA-LAD   CORONARY STENT INTERVENTION N/A 10/15/2022   Procedure: CORONARY STENT INTERVENTION;  Surgeon: Ammon Blunt, MD;  Location: [facility] INVASIVE CV LAB;  Service: Cardiovascular;  Laterality: N/A;   LEFT HEART CATH N/A 10/15/2022   Procedure: Left Heart Cath;  Surgeon: Ammon Blunt, MD;  Location: [facility] INVASIVE  CV LAB;  Service: Cardiovascular;  Laterality: N/A;   LEFT HEART CATH AND CORS/GRAFTS ANGIOGRAPHY N/A 05/21/2019   Procedure: LEFT HEART CATH AND CORONARY ANGIOGRAPHY;  Surgeon: Bathe Denyse LABOR, MD;  Location: [facility] INVASIVE CV LAB;  Service: Cardiovascular;  Laterality: N/A;   LUMBAR LAMINECTOMY/DECOMPRESSION MICRODISCECTOMY N/A 07/04/2020   Procedure: L4-5 LAMINECTOMY;  Surgeon: Bluford Standing, MD;  Location: [facility] ORS;  Service: Neurosurgery;  Laterality: N/A;   REPLACEMENT TOTAL KNEE BILATERAL       Current Outpatient Medications  Medication Sig Dispense Refill   albuterol  (VENTOLIN  HFA) 108 (90 Base) MCG/ACT inhaler Inhale 2 puffs into the lungs every 6 (six) hours as needed for wheezing or shortness of breath.     amiodarone  (PACERONE ) 200 MG tablet Take 1 tablet (200 mg total) by mouth daily. 30 tablet 2   atorvastatin  (LIPITOR ) 80 MG tablet Take 80 mg by mouth daily.     buPROPion  (WELLBUTRIN  XL) 300 MG 24 hr tablet Take 300 mg by mouth daily.     ELIQUIS  5 MG TABS tablet TAKE 1 TABLET BY MOUTH TWICE A DAY 60 tablet 3   ENTRESTO  97-103 MG TAKE 1 TABLET BY MOUTH TWICE A DAY 180 tablet 1   EPINEPHrine  0.3 mg/0.3  mL IJ SOAJ injection Inject 0.3 mg into the muscle as needed for anaphylaxis.     ezetimibe  (ZETIA ) 10 MG tablet Take 10 mg by mouth daily.     fluticasone  (FLONASE ) 50 MCG/ACT nasal spray SPRAY 2 SPRAYS INTO EACH NOSTRIL EVERY DAY     gabapentin  (NEURONTIN ) 300 MG capsule Take 400 mg by mouth in the morning, at noon, and at bedtime.     isosorbide  mononitrate (IMDUR ) 30 MG 24 hr tablet Take 1 tablet (30 mg total) by mouth daily. 30 tablet 1   loratadine  (CLARITIN ) 10 MG tablet Take 10 mg by mouth daily.     metFORMIN  (GLUCOPHAGE ) 500 MG tablet Take 1,000 mg by mouth 2 (two) times daily.     metoprolol  succinate (TOPROL -XL) 25 MG 24 hr tablet TAKE 1 TAB BY MOUTH ONCE DAILY 90 tablet 2   nitroGLYCERIN  (NITROSTAT ) 0.4 MG SL tablet Place 0.4 mg under the tongue every 5 (five) minutes  as needed for chest pain.     pantoprazole  (PROTONIX ) 40 MG tablet TAKE 1 TABLET BY MOUTH EVERY DAY 30 tablet 1   ranolazine  (RANEXA ) 1000 MG SR tablet Take 1 tablet (1,000 mg total) by mouth 2 (two) times daily. 60 tablet 2   No current facility-administered medications for this visit.    Allergies:   Morphine , Yellow jacket venom [bee venom], and Trazodone     Social History:   reports that he has quit smoking. He has never used smokeless tobacco. He reports that he does not currently use alcohol. He reports that he does not use drugs.   Family History:  family history includes Osteoarthritis in his mother; Other (age of onset: 63) in his father.    ROS:     Review of Systems  Constitutional: Negative.   HENT: Negative.    Eyes: Negative.   Respiratory: Negative.    Cardiovascular:  Positive for chest pain.  Gastrointestinal: Negative.   Genitourinary: Negative.   Musculoskeletal: Negative.   Skin: Negative.   Neurological: Negative.   Endo/Heme/Allergies: Negative.   Psychiatric/Behavioral: Negative.    All other systems reviewed and are negative.     All other systems are reviewed and negative.    PHYSICAL EXAM: VS:  BP 120/68   Pulse 69   Ht 6' (1.829 m)   Wt 218 lb 3.2 oz (99 kg)   SpO2 95%   BMI 29.59 kg/m  , BMI Body mass index is 29.59 kg/m. Last weight:  Wt Readings from Last 3 Encounters:  10/24/23 218 lb 3.2 oz (99 kg)  10/17/23 216 lb 6.4 oz (98.2 kg)  07/09/23 219 lb 6.4 oz (99.5 kg)     Physical Exam Vitals reviewed.  Constitutional:      Appearance: Normal appearance. He is normal weight.  HENT:     Head: Normocephalic.     Nose: Nose normal.     Mouth/Throat:     Mouth: Mucous membranes are moist.  Eyes:     Pupils: Pupils are equal, round, and reactive to light.  Cardiovascular:     Rate and Rhythm: Normal rate and regular rhythm.     Pulses: Normal pulses.     Heart sounds: Normal heart sounds.  Pulmonary:     Effort: Pulmonary  effort is normal.  Abdominal:     General: Abdomen is flat. Bowel sounds are normal.  Musculoskeletal:        General: Normal range of motion.     Cervical back: Normal range of motion.  Skin:    General: Skin is warm.  Neurological:     General: No focal deficit present.     Mental Status: He is alert.  Psychiatric:        Mood and Affect: Mood normal.       EKG:   Recent Labs: No results found for requested labs within last 365 days.    Lipid Panel    Component Value Date/Time   CHOL 105 10/13/2022 0313   TRIG 55 10/13/2022 0313   HDL 59 10/13/2022 0313   CHOLHDL 1.8 10/13/2022 0313   VLDL 11 10/13/2022 0313   LDLCALC 35 10/13/2022 0313      Other studies Reviewed: Additional studies/ records that were reviewed today include:  Review of the above records demonstrates:       No data to display            ASSESSMENT AND PLAN:    ICD-10-CM   1. Other chest pain  R07.89 ranolazine  (RANEXA ) 1000 MG SR tablet    isosorbide  mononitrate (IMDUR ) 30 MG 24 hr tablet    PCV ECHOCARDIOGRAM COMPLETE    MYOCARDIAL PERFUSION IMAGING   Advise echo, stress test and add ranexa .    2. Coronary artery disease involving native coronary artery of native heart without angina pectoris  I25.10 ranolazine  (RANEXA ) 1000 MG SR tablet    isosorbide  mononitrate (IMDUR ) 30 MG 24 hr tablet    PCV ECHOCARDIOGRAM COMPLETE    MYOCARDIAL PERFUSION IMAGING    3. SOB (shortness of breath)  R06.02 ranolazine  (RANEXA ) 1000 MG SR tablet    isosorbide  mononitrate (IMDUR ) 30 MG 24 hr tablet    PCV ECHOCARDIOGRAM COMPLETE    MYOCARDIAL PERFUSION IMAGING    4. HFrEF (heart failure with reduced ejection fraction) (HCC)  I50.20 ranolazine  (RANEXA ) 1000 MG SR tablet    isosorbide  mononitrate (IMDUR ) 30 MG 24 hr tablet    PCV ECHOCARDIOGRAM COMPLETE    MYOCARDIAL PERFUSION IMAGING    5. Atrial fibrillation status post cardioversion (HCC)  I48.91 ranolazine  (RANEXA ) 1000 MG SR tablet     isosorbide  mononitrate (IMDUR ) 30 MG 24 hr tablet    PCV ECHOCARDIOGRAM COMPLETE    MYOCARDIAL PERFUSION IMAGING    6. Coronary artery disease involving coronary bypass graft with unstable angina pectoris, unspecified whether native or transplanted heart (HCC)  I25.700 ranolazine  (RANEXA ) 1000 MG SR tablet    isosorbide  mononitrate (IMDUR ) 30 MG 24 hr tablet    PCV ECHOCARDIOGRAM COMPLETE    MYOCARDIAL PERFUSION IMAGING       Problem List Items Addressed This Visit       Cardiovascular and Mediastinum   CAD (coronary artery disease)   Relevant Medications   ranolazine  (RANEXA ) 1000 MG SR tablet   isosorbide  mononitrate (IMDUR ) 30 MG 24 hr tablet   Other Relevant Orders   PCV ECHOCARDIOGRAM COMPLETE   MYOCARDIAL PERFUSION IMAGING   PCV ECHOCARDIOGRAM COMPLETE   MYOCARDIAL PERFUSION IMAGING   Atrial fibrillation status post cardioversion (HCC)   Relevant Medications   ranolazine  (RANEXA ) 1000 MG SR tablet   isosorbide  mononitrate (IMDUR ) 30 MG 24 hr tablet   Other Relevant Orders   PCV ECHOCARDIOGRAM COMPLETE   MYOCARDIAL PERFUSION IMAGING   HFrEF (heart failure with reduced ejection fraction) (HCC)   Relevant Medications   ranolazine  (RANEXA ) 1000 MG SR tablet   isosorbide  mononitrate (IMDUR ) 30 MG 24 hr tablet   Other Relevant Orders   PCV ECHOCARDIOGRAM COMPLETE   MYOCARDIAL PERFUSION IMAGING  Other   Chest pain - Primary   Relevant Medications   ranolazine  (RANEXA ) 1000 MG SR tablet   isosorbide  mononitrate (IMDUR ) 30 MG 24 hr tablet   Other Relevant Orders   PCV ECHOCARDIOGRAM COMPLETE   MYOCARDIAL PERFUSION IMAGING   Other Visit Diagnoses       SOB (shortness of breath)       Relevant Medications   ranolazine  (RANEXA ) 1000 MG SR tablet   isosorbide  mononitrate (IMDUR ) 30 MG 24 hr tablet   Other Relevant Orders   PCV ECHOCARDIOGRAM COMPLETE   MYOCARDIAL PERFUSION IMAGING          Disposition:   Return in about 2 weeks (around 11/07/2023) for echo,  stress test and f/u.    Total time spent: 30 minutes  Signed,  Denyse Bathe, MD  10/24/2023 2:20 PM    [facility]

## 2023-10-28 ENCOUNTER — Encounter: Admitting: Emergency Medicine

## 2023-10-28 DIAGNOSIS — Z955 Presence of coronary angioplasty implant and graft: Secondary | ICD-10-CM | POA: Diagnosis not present

## 2023-10-28 NOTE — Progress Notes (Signed)
 Daily Session Note  Patient Details  Name: Devin [name] [facility]. MRN: [id number] Date of Birth: 08-Apr-1945 Referring Provider:   Flowsheet Row Cardiac Rehab from 05/20/2023 in [facility] Cardiac and Pulmonary Rehab  Referring Provider Dr. Denyse Bathe    Encounter Date: 10/28/2023  Check In:  Session Check In - 10/28/23 1604       Check-In   Supervising physician immediately available to respond to emergencies See telemetry face sheet for immediately available ER MD    Location [facility]-Cardiac & Pulmonary Rehab    Staff Present Othel Durand, RN, BSN, CCRP;Maxon Conetta BS, Exercise Physiologist;Joseph Hood RCP,RRT,BSRT;Lauren Keirstyn Aydt RN,BSN    Virtual Visit No    Medication changes reported     No    Fall or balance concerns reported    No    Tobacco Cessation No Change    Warm-up and Cool-down Performed on first and last piece of equipment    Resistance Training Performed Yes    VAD Patient? No    PAD/SET Patient? No      Pain Assessment   Currently in Pain? No/denies             Social History   Tobacco Use  Smoking Status Former  Smokeless Tobacco Never  Tobacco Comments   Quit over 40 years ago    Goals Met:  Independence with exercise equipment Exercise tolerated well No report of concerns or symptoms today Strength training completed today  Goals Unmet:  Not Applicable  Comments: Pt able to follow exercise prescription today without complaint.  Will continue to monitor for progression.    Dr. Oneil Pinal is Medical Director for [facility] Cardiac Rehabilitation.  Dr. Fuad Aleskerov is Medical Director for [facility] Pulmonary Rehabilitation.

## 2023-10-31 ENCOUNTER — Encounter: Admitting: *Deleted

## 2023-10-31 DIAGNOSIS — Z955 Presence of coronary angioplasty implant and graft: Secondary | ICD-10-CM

## 2023-10-31 NOTE — Progress Notes (Signed)
 Daily Session Note  Patient Details  Name: Devin [name] [facility]. MRN: [id number] Date of Birth: 1945/05/10 Referring Provider:   Flowsheet Row Cardiac Rehab from 05/20/2023 in [facility] Cardiac and Pulmonary Rehab  Referring Provider Dr. Denyse Bathe    Encounter Date: 10/31/2023  Check In:  Session Check In - 10/31/23 1535       Check-In   Supervising physician immediately available to respond to emergencies See telemetry face sheet for immediately available ER MD    Location [facility]-Cardiac & Pulmonary Rehab    Staff Present Leita Franks RN,BSN;Joseph Rolinda RCP,RRT,BSRT;Noah Tickle, [place], Exercise Physiologist;Mizani Dilday Tressa RN,BSN    Virtual Visit No    Medication changes reported     No    Fall or balance concerns reported    No    Warm-up and Cool-down Performed on first and last piece of equipment    Resistance Training Performed Yes    VAD Patient? No    PAD/SET Patient? No      Pain Assessment   Currently in Pain? No/denies             Social History   Tobacco Use  Smoking Status Former  Smokeless Tobacco Never  Tobacco Comments   Quit over 40 years ago    Goals Met:  Independence with exercise equipment Exercise tolerated well No report of concerns or symptoms today Strength training completed today  Goals Unmet:  Not Applicable  Comments: Pt able to follow exercise prescription today without complaint.  Will continue to monitor for progression.    Dr. Oneil Pinal is Medical Director for [facility] Cardiac Rehabilitation.  Dr. Fuad Aleskerov is Medical Director for [facility] Pulmonary Rehabilitation.

## 2023-11-03 ENCOUNTER — Other Ambulatory Visit: Payer: Self-pay | Admitting: Cardiovascular Disease

## 2023-11-03 DIAGNOSIS — I4891 Unspecified atrial fibrillation: Secondary | ICD-10-CM

## 2023-11-03 DIAGNOSIS — I25700 Atherosclerosis of coronary artery bypass graft(s), unspecified, with unstable angina pectoris: Secondary | ICD-10-CM

## 2023-11-03 DIAGNOSIS — I2510 Atherosclerotic heart disease of native coronary artery without angina pectoris: Secondary | ICD-10-CM

## 2023-11-03 DIAGNOSIS — I5020 Unspecified systolic (congestive) heart failure: Secondary | ICD-10-CM

## 2023-11-03 DIAGNOSIS — I739 Peripheral vascular disease, unspecified: Secondary | ICD-10-CM

## 2023-11-04 ENCOUNTER — Encounter: Payer: Self-pay | Admitting: Cardiovascular Disease

## 2023-11-04 ENCOUNTER — Ambulatory Visit

## 2023-11-05 ENCOUNTER — Other Ambulatory Visit: Payer: Self-pay

## 2023-11-05 DIAGNOSIS — I4891 Unspecified atrial fibrillation: Secondary | ICD-10-CM

## 2023-11-05 DIAGNOSIS — I25700 Atherosclerosis of coronary artery bypass graft(s), unspecified, with unstable angina pectoris: Secondary | ICD-10-CM

## 2023-11-05 DIAGNOSIS — R0602 Shortness of breath: Secondary | ICD-10-CM

## 2023-11-05 DIAGNOSIS — I5020 Unspecified systolic (congestive) heart failure: Secondary | ICD-10-CM

## 2023-11-05 DIAGNOSIS — I2510 Atherosclerotic heart disease of native coronary artery without angina pectoris: Secondary | ICD-10-CM

## 2023-11-05 DIAGNOSIS — R0789 Other chest pain: Secondary | ICD-10-CM

## 2023-11-05 MED ORDER — ISOSORBIDE MONONITRATE ER 30 MG PO TB24: 30.0000 mg | ORAL_TABLET | Freq: Every day | ORAL | 1 refills | Status: DC

## 2023-11-06 DIAGNOSIS — I214 Non-ST elevation (NSTEMI) myocardial infarction: Secondary | ICD-10-CM

## 2023-11-06 DIAGNOSIS — Z955 Presence of coronary angioplasty implant and graft: Secondary | ICD-10-CM

## 2023-11-06 NOTE — Progress Notes (Signed)
 Cardiac Individual Treatment Plan  Patient Details  Name: Devin [name] [facility]. MRN: [id number] Date of Birth: 10/22/1945 Referring Provider:   Flowsheet Row Cardiac Rehab from 05/20/2023 in [facility] Cardiac and Pulmonary Rehab  Referring Provider Dr. Denyse Bathe    Initial Encounter Date:  Flowsheet Row Cardiac Rehab from 05/20/2023 in [facility] Cardiac and Pulmonary Rehab  Date 05/20/23    Visit Diagnosis: Status post coronary artery stent placement  NSTEMI (non-ST elevation myocardial infarction) [facility])  Patient's Home Medications on Admission:  Current Outpatient Medications:    albuterol  (VENTOLIN  HFA) 108 (90 Base) MCG/ACT inhaler, Inhale 2 puffs into the lungs every 6 (six) hours as needed for wheezing or shortness of breath., Disp: , Rfl:    amiodarone  (PACERONE ) 200 MG tablet, Take 1 tablet (200 mg total) by mouth daily., Disp: 30 tablet, Rfl: 2   atorvastatin  (LIPITOR ) 80 MG tablet, Take 80 mg by mouth daily., Disp: , Rfl:    buPROPion  (WELLBUTRIN  XL) 300 MG 24 hr tablet, Take 300 mg by mouth daily., Disp: , Rfl:    ELIQUIS  5 MG TABS tablet, TAKE 1 TABLET BY MOUTH TWICE A DAY, Disp: 60 tablet, Rfl: 3   ENTRESTO  97-103 MG, TAKE 1 TABLET BY MOUTH TWICE A DAY, Disp: 180 tablet, Rfl: 1   EPINEPHrine  0.3 mg/0.3 mL IJ SOAJ injection, Inject 0.3 mg into the muscle as needed for anaphylaxis., Disp: , Rfl:    ezetimibe  (ZETIA ) 10 MG tablet, Take 10 mg by mouth daily., Disp: , Rfl:    fluticasone  (FLONASE ) 50 MCG/ACT nasal spray, SPRAY 2 SPRAYS INTO EACH NOSTRIL EVERY DAY, Disp: , Rfl:    gabapentin  (NEURONTIN ) 300 MG capsule, Take 400 mg by mouth in the morning, at noon, and at bedtime., Disp: , Rfl:    isosorbide  mononitrate (IMDUR ) 30 MG 24 hr tablet, Take 1 tablet (30 mg total) by mouth daily., Disp: 30 tablet, Rfl: 1   loratadine  (CLARITIN ) 10 MG tablet, Take 10 mg by mouth daily., Disp: , Rfl:    metFORMIN  (GLUCOPHAGE ) 500 MG tablet, Take 1,000 mg by mouth 2 (two) times daily., Disp: ,  Rfl:    metoprolol  succinate (TOPROL -XL) 25 MG 24 hr tablet, TAKE 1 TAB BY MOUTH ONCE DAILY, Disp: 90 tablet, Rfl: 2   nitroGLYCERIN  (NITROSTAT ) 0.4 MG SL tablet, Place 0.4 mg under the tongue every 5 (five) minutes as needed for chest pain., Disp: , Rfl:    pantoprazole  (PROTONIX ) 40 MG tablet, TAKE 1 TABLET BY MOUTH EVERY DAY, Disp: 30 tablet, Rfl: 1  Past Medical History: 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   Hypertension    Ischemic cardiomyopathy    MI, old    Peripheral neuropathy    PFO (patent foramen ovale) 05/2019   s/p repair   Sleep apnea    Spinal stenosis of lumbar region    TIA (transient ischemic attack)     Tobacco Use: Social History   Tobacco Use  Smoking Status Former  Smokeless Tobacco Never  Tobacco Comments   Quit over 40 years ago  Labs: Review Flowsheet  More data exists      Latest Ref Rng & Units 04/08/2020 07/04/2020 04/12/2022 04/13/2022 10/13/2022  Labs for ITP Cardiac and Pulmonary Rehab  Cholestrol 0 - 200 mg/dL - - - - 894   LDL (calc) 0 - 99 mg/dL - - - - 35   HDL-C >59 mg/dL - - - - 59   Trlycerides <150 mg/dL - - - - 55   Hemoglobin A1c 4.8 - 5.6 % 5.8  - - 5.5  6.3   Bicarbonate 20.0 - 28.0 mmol/L - - 23.9  - -  TCO2 22 - 32 mmol/L - 24  - - -  Acid-base deficit 0.0 - 2.0 mmol/L - - 0.1  - -  O2 Saturation % - - 90.3  - -     Exercise Target Goals: Exercise Program Goal: Individual exercise prescription set using results from initial 6 min walk test and THRR while considering  patient's activity barriers and safety.   Exercise Prescription Goal: Initial exercise prescription  builds to 30-45 minutes a day of aerobic activity, 2-3 days per week.  Home exercise guidelines will be given to patient during program as part of exercise prescription that the participant will acknowledge.   Education: Aerobic Exercise: - Group verbal and visual presentation on the components of exercise prescription. Introduces F.I.T.T principle from ACSM for exercise prescriptions.  Reviews F.I.T.T. principles of aerobic exercise including progression. Written material provided at class time. Flowsheet Row Cardiac Rehab from 11/19/2022 in [facility] Cardiac and Pulmonary Rehab  Education need identified 11/19/22    Education: Resistance Exercise: - Group verbal and visual presentation on the components of exercise prescription. Introduces F.I.T.T principle from ACSM for exercise prescriptions  Reviews F.I.T.T. principles of resistance exercise including progression. Written material provided at class time.    Education: Exercise & Equipment Safety: - Individual verbal instruction and demonstration of equipment use and safety with use of the equipment. Flowsheet Row Cardiac Rehab from 11/19/2022 in [facility] Cardiac and Pulmonary Rehab  Date 11/19/22  Educator NT  Instruction Review Code 1- Verbalizes Understanding    Education: Exercise Physiology & General Exercise Guidelines: - Group verbal and written instruction with models to review the exercise physiology of the cardiovascular system and associated critical values. Provides general exercise guidelines with specific guidelines to those with heart or lung disease. Written material provided at class time. Flowsheet Row Cardiac Rehab from 10/07/2019 in [facility] Cardiac and Pulmonary Rehab  Date 09/23/19  Educator AS  Instruction Review Code 1- Verbalizes Understanding    Education: Flexibility, Balance, Mind/Body Relaxation: - Group verbal and visual presentation with interactive activity on the components of exercise prescription. Introduces F.I.T.T  principle from ACSM for exercise prescriptions. Reviews F.I.T.T. principles of flexibility and balance exercise training including progression. Also discusses the mind body connection.  Reviews various relaxation techniques to help reduce and manage stress (i.e. Deep breathing, progressive muscle relaxation, and visualization). Balance handout provided to take home. Written material provided at class time.   Activity Barriers & Risk Stratification:  Activity Barriers & Cardiac Risk Stratification - 05/20/23 1536       Activity Barriers & Cardiac Risk Stratification   Activity Barriers Right Knee Replacement;Left Knee Replacement;Muscular Weakness;Balance Concerns;Other (comment)    Comments drop foot, impaired gait, neuropathy    Cardiac Risk Stratification Moderate          6 Minute Walk:  6 Minute Walk     Row Name 05/20/23 1535 10/17/23 1559  6 Minute Walk   Phase Initial Discharge    Distance 720 feet 625 feet    Distance % Change -- -13 %    Distance Feet Change 100 ft -95 ft    Walk Time 6 minutes 6 minutes    # of Rest Breaks 0 0    MPH 1.36 1.18    METS 1.13 1.1    RPE 13 15    Perceived Dyspnea  1 0    VO2 Peak 3.96 3.86    Symptoms No Yes (comment)    Comments -- lower leg fatigue    Resting HR 65 bpm 92 bpm    Resting BP -- 94/52    Resting Oxygen Saturation  95 % 96 %    Exercise Oxygen Saturation  during 6 min walk 95 % 96 %    Max Ex. HR 92 bpm 102 bpm    Max Ex. BP 122/64 122/60       Oxygen Initial Assessment:   Oxygen Re-Evaluation:   Oxygen Discharge (Final Oxygen Re-Evaluation):   Initial Exercise Prescription:  Initial Exercise Prescription - 05/20/23 1500       Date of Initial Exercise RX and Referring Provider   Date 05/20/23    Referring Provider Dr. Denyse Bathe      Oxygen   Maintain Oxygen Saturation 88% or higher      Treadmill   MPH 1      NuStep   Level 4   T6: 3   Minutes 15    METs 2.3      T5 Nustep   Level  4    Minutes 15    METs 3      Track   Laps 15    Minutes 15    METs 1.82      Resistance Training   Training Prescription Yes    Weight 4 lb    Reps 10-15          Perform Capillary Blood Glucose checks as needed.  Exercise Prescription Changes:   Exercise Prescription Changes     Row Name 05/28/23 1500 06/12/23 1500 06/27/23 1600 07/10/23 0800 07/22/23 1600     Response to Exercise   Blood Pressure (Admit) 100/62 112/70 138/66 110/60 142/80   Blood Pressure (Exercise) -- 118/60 140/62 126/80 --   Blood Pressure (Exit) 112/64 98/54 118/60 110/58 132/70   Heart Rate (Admit) 65 bpm 78 bpm 63 bpm 77 bpm 77 bpm   Heart Rate (Exercise) 90 bpm 96 bpm 93 bpm 99 bpm 106 bpm   Heart Rate (Exit) 67 bpm 70 bpm 75 bpm 84 bpm 82 bpm   Rating of Perceived Exertion (Exercise) 13 15 13 17 15    Symptoms none none none none none   Comments First two days back in rehab -- -- -- --   Duration Continue with 30 min of aerobic exercise without signs/symptoms of physical distress. Continue with 30 min of aerobic exercise without signs/symptoms of physical distress. Continue with 30 min of aerobic exercise without signs/symptoms of physical distress. Continue with 30 min of aerobic exercise without signs/symptoms of physical distress. Continue with 30 min of aerobic exercise without signs/symptoms of physical distress.   Intensity THRR unchanged THRR unchanged THRR unchanged THRR unchanged THRR unchanged     Progression   Progression Continue to progress workloads to maintain intensity without signs/symptoms of physical distress. Continue to progress workloads to maintain intensity without signs/symptoms of physical distress. Continue to progress workloads to  maintain intensity without signs/symptoms of physical distress. Continue to progress workloads to maintain intensity without signs/symptoms of physical distress. Continue to progress workloads to maintain intensity without signs/symptoms of  physical distress.   Average METs 2.27 2.09 2.14 1.61 1.82     Resistance Training   Training Prescription Yes Yes Yes Yes Yes   Weight 3 lb 3 lb 3 lb 5 lb 5 lb   Reps 10-15 10-15 10-15 10-15 10-15     Interval Training   Interval Training No No No No No     NuStep   Level 5 5  T6: 3 4 3 3   T6: level 3   Minutes 15 15 15 15 15    METs 2.4 3  T6: 2 2.4 1.5 1.7  T6: 1.9 METs     Arm Ergometer   Level -- -- -- 1 --   Minutes -- -- -- 15 --   METs -- -- -- 1 --     T5 Nustep   Level -- -- -- 3  T6 --   Minutes -- -- -- 15 --   METs -- -- -- 1.7 --     Track   Laps 15 25 22 17 17    Minutes 15 15 15 15 15    METs 1.82 2.36 2.2 1.92 1.92     Oxygen   Maintain Oxygen Saturation 88% or higher 88% or higher 88% or higher 88% or higher 88% or higher    Row Name 08/08/23 1700 09/04/23 1400 09/16/23 1600 10/03/23 1500 10/15/23 1400     Response to Exercise   Blood Pressure (Admit) 112/70 110/60 106/60 106/60 100/60   Blood Pressure (Exit) 118/70 100/68 120/62 102/58 116/58   Heart Rate (Admit) 75 bpm 73 bpm 69 bpm 103 bpm 84 bpm   Heart Rate (Exercise) 100 bpm 101 bpm 82 bpm 103 bpm 92 bpm   Heart Rate (Exit) 79 bpm 73 bpm 73 bpm 77 bpm 66 bpm   Rating of Perceived Exertion (Exercise) 14 13 12 13 14    Symptoms none none none none none   Duration Continue with 30 min of aerobic exercise without signs/symptoms of physical distress. Continue with 30 min of aerobic exercise without signs/symptoms of physical distress. Continue with 30 min of aerobic exercise without signs/symptoms of physical distress. Continue with 30 min of aerobic exercise without signs/symptoms of physical distress. Continue with 30 min of aerobic exercise without signs/symptoms of physical distress.   Intensity THRR unchanged THRR unchanged THRR unchanged THRR unchanged THRR unchanged     Progression   Progression Continue to progress workloads to maintain intensity without signs/symptoms of physical distress.  Continue to progress workloads to maintain intensity without signs/symptoms of physical distress. Continue to progress workloads to maintain intensity without signs/symptoms of physical distress. Continue to progress workloads to maintain intensity without signs/symptoms of physical distress. Continue to progress workloads to maintain intensity without signs/symptoms of physical distress.   Average METs 1.78 1.84 2.04 2.19 1.92     Resistance Training   Training Prescription Yes Yes Yes Yes Yes   Weight 5 lb 5 lb 5 lb 5 lb 5 lb   Reps 10-15 10-15 10-15 10-15 10-15     Interval Training   Interval Training No No No No No     NuStep   Level 3 3 4 5 5    Minutes 15 15 15 15 15    METs 2 2 2.3 3 1.9     T5 Nustep  Level 1  T6 -- -- -- --   Minutes 15 -- -- -- --   METs 1.5 -- -- -- --     Track   Laps 17 16 4 20 18    Minutes 15 15 15 15 15    METs 1.92 1.87 1.27 2.09 1.98     Oxygen   Maintain Oxygen Saturation 88% or higher 88% or higher 88% or higher 88% or higher 88% or higher    Row Name 11/01/23 0800             Response to Exercise   Blood Pressure (Admit) 132/70       Blood Pressure (Exit) 98/54       Heart Rate (Admit) 63 bpm       Heart Rate (Exercise) 98 bpm       Heart Rate (Exit) 67 bpm       Rating of Perceived Exertion (Exercise) 15       Symptoms none       Duration Continue with 30 min of aerobic exercise without signs/symptoms of physical distress.       Intensity THRR unchanged         Progression   Progression Continue to progress workloads to maintain intensity without signs/symptoms of physical distress.       Average METs 1.66         Resistance Training   Training Prescription Yes       Weight 5 lb       Reps 10-15         Interval Training   Interval Training No         NuStep   Level 3       Minutes 15       METs 2.1         Track   Laps 12       Minutes 15       METs 1.53         Oxygen   Maintain Oxygen Saturation 88% or higher           Exercise Comments:   Exercise Comments     Row Name 05/20/23 1537           Exercise Comments First full day of exercise!  Patient was oriented to gym and equipment including functions, settings, policies, and procedures.  Patient's individual exercise prescription and treatment plan were reviewed.  All starting workloads were established based on the results of the 6 minute walk test done at initial orientation visit.  The plan for exercise progression was also introduced and progression will be customized based on patient's performance and goals.          Exercise Goals and Review:   Exercise Goals     Row Name 05/20/23 1536             Exercise Goals   Increase Physical Activity Yes       Intervention Develop an individualized exercise prescription for aerobic and resistive training based on initial evaluation findings, risk stratification, comorbidities and participant's personal goals.;Provide advice, education, support and counseling about physical activity/exercise needs.       Expected Outcomes Long Term: Add in home exercise to make exercise part of routine and to increase amount of physical activity.;Long Term: Exercising regularly at least 3-5 days a week.;Short Term: Attend rehab on a regular basis to increase amount of physical activity.       Able to understand and  use rate of perceived exertion (RPE) scale Yes       Intervention Provide education and explanation on how to use RPE scale       Expected Outcomes Short Term: Able to use RPE daily in rehab to express subjective intensity level;Long Term:  Able to use RPE to guide intensity level when exercising independently       Able to understand and use Dyspnea scale Yes       Intervention Provide education and explanation on how to use Dyspnea scale       Expected Outcomes Short Term: Able to use Dyspnea scale daily in rehab to express subjective sense of shortness of breath during exertion;Long Term: Able  to use Dyspnea scale to guide intensity level when exercising independently       Knowledge and understanding of Target Heart Rate Range (THRR) Yes       Intervention Provide education and explanation of THRR including how the numbers were predicted and where they are located for reference       Expected Outcomes Short Term: Able to state/look up THRR;Short Term: Able to use daily as guideline for intensity in rehab;Long Term: Able to use THRR to govern intensity when exercising independently       Able to check pulse independently Yes       Intervention Provide education and demonstration on how to check pulse in carotid and radial arteries.;Review the importance of being able to check your own pulse for safety during independent exercise       Expected Outcomes Short Term: Able to explain why pulse checking is important during independent exercise;Long Term: Able to check pulse independently and accurately       Understanding of Exercise Prescription Yes       Intervention Provide education, explanation, and written materials on patient's individual exercise prescription       Expected Outcomes Short Term: Able to explain program exercise prescription;Long Term: Able to explain home exercise prescription to exercise independently          Exercise Goals Re-Evaluation :  Exercise Goals Re-Evaluation     Row Name 05/28/23 1518 06/12/23 1553 06/27/23 1633 07/10/23 0817 07/22/23 1620     Exercise Goal Re-Evaluation   Exercise Goals Review Increase Physical Activity;Increase Strength and Stamina;Understanding of Exercise Prescription Increase Physical Activity;Increase Strength and Stamina;Understanding of Exercise Prescription Increase Physical Activity;Increase Strength and Stamina;Understanding of Exercise Prescription Increase Physical Activity;Increase Strength and Stamina;Understanding of Exercise Prescription Increase Physical Activity;Increase Strength and Stamina;Understanding of Exercise  Prescription   Comments Ed did well in his first two sessions back in rehab since returning with a new referral. He worked at level 5 on the T4 nustep and was able to walk 15 laps on the track. He also did well with 3 lb hand weights for resistance training. We will continue to monitor his progress in the program. Ed is doing well in rehab. He increased to 25 laps walked on the track and continues to use 3 lb hand weights for resistance training. He also has stayed consistent at level 3 on the T6 and level 5 on the T4 nustep. We will continue to monitor his progress in the program. Ed continues to do well in rehab. He was unable to make any workload increases during this review, but was however able to maintain his intensity on both the track and the T4 nustep. We will continue to monitor his progress in the program. Ed is doing well in rehab.  He increased to 5lb handweights for resistance. He maintained level 3 on the T6 and level 1 on the arm ergometer. He decreased to level 3 on the T4 from level 4. He also decreased his laps to 17 on the track from 22. We will continue to monitor his progress in the program. Ed is doing well in rehab. He maintained his workloads at level 3 on both the T6 and T4 nustep machines. He also continues to walk 17 laps on the track. We will continue to monitor his progress in the program.   Expected Outcomes Short: Continue to follow current exercise prescription. Long: Continue exercise to improve strength and stamina. Short: Continue to push for more laps on the track. Long: Continue exercise to improve strength and stamina. Short: Continue to push for more track laps, increase to level 5 on the T4 nustep. Long: Continue exercise to improve strength and stamina. Short: Push for more laps on the track to get back to 22 laps and try level 4 on the T4 and T6 nusteps. Long: Continue exercise to improve strength and stamina. Short: Continue to push for more laps on the track. Long:  Continue exercise to improve strength and stamina.    Row Name 08/08/23 1742 08/22/23 1758 09/04/23 1456 09/16/23 1615 10/03/23 1559     Exercise Goal Re-Evaluation   Exercise Goals Review Increase Physical Activity;Increase Strength and Stamina;Understanding of Exercise Prescription Increase Physical Activity;Increase Strength and Stamina;Understanding of Exercise Prescription Increase Physical Activity;Increase Strength and Stamina;Understanding of Exercise Prescription Increase Physical Activity;Increase Strength and Stamina;Understanding of Exercise Prescription Increase Physical Activity;Increase Strength and Stamina;Understanding of Exercise Prescription   Comments Ed is doing well in rehab. He maintained walking 17 laps on the track and his workload at level 3 on T4 nustep. He decreased to level 1 on the T6 nustep from level 3. We will continue to monitor his progress in the program. Ed has not attended since 5/22 as he has been out sick with pneumonia. He plans to return the week of 6/9. We will continue to monitor his progress when he returns. Ed returned to rehab in this review after being out sick with pneumonia. Ed is doing well in rehab. He maintained level 3 on the T4 nustep. He decreased his laps by 1 to 16 laps on the track. We will continue to monitor his progress in the program. Ed has done well in rehab. He was able to increase from level 3 to 4 on the T4 nustep. He was also able to walk the track, but was only able to walk 4 laps. We will continue to monitor his progress in the program. Ed is doing well in rehab. He was able to increase from level 4 to level 5 on the T4 nustep. He was also able to walk the track, and increased up to 20 laps. We will continue to monitor his progress in the program.   Expected Outcomes Short: Increase back to level 3 on the T6 nustep and push for more laps on the track. Long: Continue exercise to improve strength and stamina. Short: Return to rehab once  recovered. Long: Continue exercise to improve strength and stamina. Short: Try level 4 on the T4 nustep and get back to 17 laps on the track. Long: Continue exercise to improve strength and stamina. Short: Return to 17 laps on track. Long: Continue exercise to improve strength and stamina. Short: Continue to progressively increase workloads. Long: Continue exercise to improve strength and stamina.  Row Name 10/07/23 1647 10/15/23 1403 10/28/23 1539 11/01/23 0847       Exercise Goal Re-Evaluation   Exercise Goals Review Increase Physical Activity;Understanding of Exercise Prescription;Increase Strength and Stamina Increase Physical Activity;Understanding of Exercise Prescription;Increase Strength and Stamina Increase Physical Activity;Able to understand and use rate of perceived exertion (RPE) scale;Understanding of Exercise Prescription Increase Physical Activity;Understanding of Exercise Prescription;Increase Strength and Stamina    Comments Ed is not doing much currently with keeping up with his exercise at home. He was walking the park about 2-3 blocks in the evening, but it is currently too hot outside. We discussed indoor options to keep up with exercise on these hot days at home and he plans to walk the ais[place] of [employer] as it is a similar distance. Ed has only attended rehab twice since the last review. He continues to work at level 5 on the T4 nustep and walked 18 laps on the track. We will continue to monitor his progress in the program. Follow up on home exercise was done. Ed reports that he has not yet added extra days of independent exercise as recommended during his home exercise review, but he has joined the [facility] and is planning to try to go there 2 days a week on off days of rehab to help increase the frequency of his exercise. He is aiming to have 4-5 days a week of exercise total. Ed is doing well in rehab and is close to graduating from the program. He recently completed  his post and decreased by 13%. He has done well continuing to walk the track and use the T4 nustep for aerobic exercise. We will continue to monitor his progress until he graduates from the program.    Expected Outcomes Short: Get back to exercising at home. Long: Continue exercising at home to improve strength and stamina. Short: Attend rehab more consistently. Long: Continue exercise to improve strength and stamina. Short: start adding 1-2 days a week of exercise at the [facility] on off days of rehab. Long: maintain independent exercise routine upon graduation from program. Short: Graduate. Long: Continue to exercise independently.       Discharge Exercise Prescription (Final Exercise Prescription Changes):  Exercise Prescription Changes - 11/01/23 0800       Response to Exercise   Blood Pressure (Admit) 132/70    Blood Pressure (Exit) 98/54    Heart Rate (Admit) 63 bpm    Heart Rate (Exercise) 98 bpm    Heart Rate (Exit) 67 bpm    Rating of Perceived Exertion (Exercise) 15    Symptoms none    Duration Continue with 30 min of aerobic exercise without signs/symptoms of physical distress.    Intensity THRR unchanged      Progression   Progression Continue to progress workloads to maintain intensity without signs/symptoms of physical distress.    Average METs 1.66      Resistance Training   Training Prescription Yes    Weight 5 lb    Reps 10-15      Interval Training   Interval Training No      NuStep   Level 3    Minutes 15    METs 2.1      Track   Laps 12    Minutes 15    METs 1.53      Oxygen   Maintain Oxygen Saturation 88% or higher          Nutrition:  Target Goals: Understanding of nutrition  guidelines, daily intake of sodium 1500mg , cholesterol 200mg , calories 30% from fat and 7% or less from saturated fats, daily to have 5 or more servings of fruits and vegetables.  Education: Nutrition 1 -Group instruction provided by verbal, written material,  interactive activities, discussions, models, and posters to present general guidelines for heart healthy nutrition including macronutrients, label reading, and promoting whole foods over processed counterparts. Education serves as Pensions consultant of discussion of heart healthy eating for all. Written material provided at class time.    Education: Nutrition 2 -Group instruction provided by verbal, written material, interactive activities, discussions, models, and posters to present general guidelines for heart healthy nutrition including sodium, cholesterol, and saturated fat. Providing guidance of habit forming to improve blood pressure, cholesterol, and body weight. Written material provided at class time.     Biometrics:   Post Biometrics - 10/17/23 1605        Post  Biometrics   Height 5' 11.5 (1.816 m)    Weight 216 lb 6.4 oz (98.2 kg)    Waist Circumference 44.5 inches    Hip Circumference 42.5 inches    Waist to Hip Ratio 1.05 %    BMI (Calculated) 29.76    Single Leg Stand 1.4 seconds          Nutrition Therapy Plan and Nutrition Goals:  Nutrition Therapy & Goals - 10/07/23 1653       Nutrition Therapy   RD appointment deferred --          Nutrition Assessments:  MEDIFICTS Score Key: >=70 Need to make dietary changes  40-70 Heart Healthy Diet <= 40 Therapeutic Level Cholesterol Diet  Flowsheet Row Cardiac Rehab from 10/24/2023 in [facility] Card[facility]  Picture Your Plate Total Score on Discharge 73   Picture Your Plate Scores: <59 Unhealthy dietary pattern with much room for improvement. 41-50 Dietary pattern unlikely to meet recommendations for good health and room for improvement. 51-60 More healthful dietary pattern, with some room for improvement.  >60 Healthy dietary pattern, although there may be some specific behaviors that could be improved.    Nutrition Goals Re-Evaluation:  Nutrition Goals Re-Evaluation     Row Name 06/13/23 1603  07/11/23 1601 10/07/23 1653         Goals   Comment Patient deferred RD appointment. Patient deferred RD appointment. Patient deferred RD appointment.        Nutrition Goals Discharge (Final Nutrition Goals Re-Evaluation):  Nutrition Goals Re-Evaluation - 10/07/23 1653       Goals   Comment Patient deferred RD appointment.          Psychosocial: Target Goals: Acknowledge presence or absence of significant depression and/or stress, maximize coping skills, provide positive support system. Participant is able to verbalize types and ability to use techniques and skills needed for reducing stress and depression.   Education: Stress, Anxiety, and Depression - Group verbal and visual presentation to define topics covered.  Reviews how body is impacted by stress, anxiety, and depression.  Also discusses healthy ways to reduce stress and to treat/manage anxiety and depression. Written material provided at class time.   Education: Sleep Hygiene -Provides group verbal and written instruction about how sleep can affect your health.  Define sleep hygiene, discuss sleep cycles and impact of sleep habits. Review good sleep hygiene tips.   Initial Review & Psychosocial Screening:  Initial Psych Review & Screening - 05/20/23 1520       Initial Review   Current issues with  None Identified      Family Dynamics   Good Support System? Yes      Barriers   Psychosocial barriers to participate in program There are no identifiable barriers or psychosocial needs.      Screening Interventions   Interventions Encouraged to exercise;To provide support and resources with identified psychosocial needs;Provide feedback about the scores to participant    Expected Outcomes Short Term goal: Utilizing psychosocial counselor, staff and physician to assist with identification of specific Stressors or current issues interfering with healing process. Setting desired goal for each stressor or current issue  identified.;Long Term Goal: Stressors or current issues are controlled or eliminated.;Short Term goal: Identification and review with participant of any Quality of Life or Depression concerns found by scoring the questionnaire.;Long Term goal: The participant improves quality of Life and PHQ9 Scores as seen by post scores and/or verbalization of changes          Quality of Life Scores:   Quality of Life - 10/24/23 1558       Quality of Life Scores   Health/Function Post 26 %    Socioeconomic Post 27.6 %    Psych/Spiritual Post 30 %    Family Post 28.5 %    GLOBAL Post 27.48 %         Scores of 19 and below usually indicate a poorer quality of life in these areas.  A difference of  2-3 points is a clinically meaningful difference.  A difference of 2-3 points in the total score of the Quality of Life Index has been associated with significant improvement in overall quality of life, self-image, physical symptoms, and general health in studies assessing change in quality of life.  PHQ-9: Review Flowsheet  More data exists      10/24/2023 06/13/2023 05/16/2023 11/19/2022 01/12/2020  Depression screen PHQ 2/9  Decreased Interest 0 0 0 1 3  Down, Depressed, Hopeless 0 0 0 1 0  PHQ - 2 Score 0 0 0 2 3  Altered sleeping 0 0 0 1 1  Tired, decreased energy 0 1 3 1 2   Change in appetite 0 1 3 1 1   Feeling bad or failure about yourself  0 0 0 0 0  Trouble concentrating 0 0 0 0 0  Moving slowly or fidgety/restless 0 0 0 0 0  Suicidal thoughts 0 0 0 0 1  PHQ-9 Score 0 2 6 5 8   Difficult doing work/chores - Not difficult at all Not difficult at all Somewhat difficult Not difficult at all   Interpretation of Total Score  Total Score Depression Severity:  1-4 = Minimal depression, 5-9 = Mild depression, 10-14 = Moderate depression, 15-19 = Moderately severe depression, 20-27 = Severe depression   Psychosocial Evaluation and Intervention:  Psychosocial Evaluation - 05/20/23 1520        Psychosocial Evaluation & Interventions   Interventions Encouraged to exercise with the program and follow exercise prescription    Comments Ed is returning to the program after another stent placement. He is motivated to attend the program and has no barriers to attending. He has completed PT for balance and plans to increase his stamina in the program. He relies on his lady friend for support and company    Expected Outcomes Short: attend cardiac rehab for education and exercise Long: develop and maintain positive self care habits    Continue Psychosocial Services  Follow up required by staff          Psychosocial  Re-Evaluation:  Psychosocial Re-Evaluation     Row Name 06/13/23 1557 07/11/23 1549 10/07/23 1650         Psychosocial Re-Evaluation   Current issues with Current Psychotropic Meds Current Psychotropic Meds Current Psychotropic Meds;Current Sleep Concerns     Comments Reviewed patient health questionnaire (PHQ-9) with patient for follow up. Previously, patients score indicated signs/symptoms of depression.  Reviewed to see if patient is improving symptom wise while in program.  Score improved and patient states that it is because he is able to exercise and have more energy. Ed states that he is still taking his anxiety/depression medication. He also states that he is sleeping well, and is not having any stressors at this time. He can look to his lady friend when he is having stress, as well as exercise. Ed stays no current stressors other than trying to figure out how to get his home exercise in and we discussed indoor options and to schedule it in his day. He is still taking his anxiety/depression medications. He is still not sleeping well and it is getting worse. He has tried different relaxation methods to try and help him fall asleep. He can look to his lady friend for support has help when he is having stress.     Expected Outcomes Short: Continue to attend [facility]  regularly for regular exercise and social engagement. Long: Continue to improve symptoms and manage a positive mental state. Short: Continue to attend [facility] regularly for regular exercise and social engagement. Long: Continue to improve symptoms and manage a positive mental state. Short: Continue to attend [facility] regularly for regular exercise and social engagement and exercise at home. Long: Continue to improve symptoms and manage a positive mental state.     Interventions Encouraged to attend Cardiac Rehabilitation for the exercise Encouraged to attend Cardiac Rehabilitation for the exercise Encouraged to attend Cardiac Rehabilitation for the exercise     Continue Psychosocial Services  Follow up required by staff Follow up required by staff Follow up required by staff        Psychosocial Discharge (Final Psychosocial Re-Evaluation):  Psychosocial Re-Evaluation - 10/07/23 1650       Psychosocial Re-Evaluation   Current issues with Current Psychotropic Meds;Current Sleep Concerns    Comments Ed stays no current stressors other than trying to figure out how to get his home exercise in and we discussed indoor options and to schedule it in his day. He is still taking his anxiety/depression medications. He is still not sleeping well and it is getting worse. He has tried different relaxation methods to try and help him fall asleep. He can look to his lady friend for support has help when he is having stress.    Expected Outcomes Short: Continue to attend [facility] regularly for regular exercise and social engagement and exercise at home. Long: Continue to improve symptoms and manage a positive mental state.    Interventions Encouraged to attend Cardiac Rehabilitation for the exercise    Continue Psychosocial Services  Follow up required by staff          Vocational Rehabilitation: Provide vocational rehab assistance to qualifying candidates.   Vocational Rehab Evaluation &  Intervention:  Vocational Rehab - 05/20/23 1519       Initial Vocational Rehab Evaluation & Intervention   Assessment shows need for Vocational Rehabilitation No          Education: Education Goals: Education classes will be provided on a variety of topics geared toward better understanding  of heart health and risk factor modification. Participant will state understanding/return demonstration of topics presented as noted by education test scores.  Learning Barriers/Preferences:   General Cardiac Education Topics:  AED/CPR: - Group verbal and written instruction with the use of models to demonstrate the basic use of the AED with the basic ABC's of resuscitation.   Test and Procedures: - Group verbal and visual presentation and models provide information about basic cardiac anatomy and function. Reviews the testing methods done to diagnose heart disease and the outcomes of the test results. Describes the treatment choices: Medical Management, Angioplasty, or Coronary Bypass Surgery for treating various heart conditions including Myocardial Infarction, Angina, Valve Disease, and Cardiac Arrhythmias. Written material provided at class time. Flowsheet Row Cardiac Rehab from 11/19/2022 in [facility] Cardiac and Pulmonary Rehab  Education need identified 11/19/22    Medication Safety: - Group verbal and visual instruction to review commonly prescribed medications for heart and lung disease. Reviews the medication, class of the drug, and side effects. Includes the steps to properly store meds and maintain the prescription regimen. Written material provided at class time. Flowsheet Row Cardiac Rehab from 10/07/2019 in [facility] Cardiac and Pulmonary Rehab  Date 10/07/19  Educator SB  Instruction Review Code 1- Verbalizes Understanding    Intimacy: - Group verbal instruction through game format to discuss how heart and lung disease can affect sexual intimacy. Written material provided at class  time.   Know Your Numbers and Heart Failure: - Group verbal and visual instruction to discuss disease risk factors for cardiac and pulmonary disease and treatment options.  Reviews associated critical values for Overweight/Obesity, Hypertension, Cholesterol, and Diabetes.  Discusses basics of heart failure: signs/symptoms and treatments.  Introduces Heart Failure Zone chart for action plan for heart failure. Written material provided at class time.   Infection Prevention: - Provides verbal and written material to individual with discussion of infection control including proper hand washing and proper equipment cleaning during exercise session. Flowsheet Row Cardiac Rehab from 11/19/2022 in [facility] Cardiac and Pulmonary Rehab  Date 11/19/22  Educator NT  Instruction Review Code 1- Verbalizes Understanding    Falls Prevention: - Provides verbal and written material to individual with discussion of falls prevention and safety. Flowsheet Row Cardiac Rehab from 11/19/2022 in [facility] Cardiac and Pulmonary Rehab  Date 11/19/22  Educator NT  Instruction Review Code 1- Verbalizes Understanding    Other: -Provides group and verbal instruction on various topics (see comments)   Knowledge Questionnaire Score:  Knowledge Questionnaire Score - 10/24/23 1558       Knowledge Questionnaire Score   Post Score 20/26          Core Components/Risk Factors/Patient Goals at Admission:  Personal Goals and Risk Factors at Admission - 05/20/23 1520       Core Components/Risk Factors/Patient Goals on Admission    Weight Management Yes;Weight Maintenance    Intervention Weight Management: Develop a combined nutrition and exercise program designed to reach desired caloric intake, while maintaining appropriate intake of nutrient and fiber, sodium and fats, and appropriate energy expenditure required for the weight goal.;Weight Management: Provide education and appropriate resources to help participant work on  and attain dietary goals.    Expected Outcomes Short Term: Continue to assess and modify interventions until short term weight is achieved;Long Term: Adherence to nutrition and physical activity/exercise program aimed toward attainment of established weight goal;Weight Maintenance: Understanding of the daily nutrition guidelines, which includes 25-35% calories from fat, 7% or less cal from  saturated fats, less than 200mg  cholesterol, less than 1.5gm of sodium, & 5 or more servings of fruits and vegetables daily    Diabetes Yes    Intervention Provide education about signs/symptoms and action to take for hypo/hyperglycemia.;Provide education about proper nutrition, including hydration, and aerobic/resistive exercise prescription along with prescribed medications to achieve blood glucose in normal ranges: Fasting glucose 65-99 mg/dL    Expected Outcomes Short Term: Participant verbalizes understanding of the signs/symptoms and immediate care of hyper/hypoglycemia, proper foot care and importance of medication, aerobic/resistive exercise and nutrition plan for blood glucose control.;Long Term: Attainment of HbA1C < 7%.    Heart Failure Yes    Intervention Provide a combined exercise and nutrition program that is supplemented with education, support and counseling about heart failure. Directed toward relieving symptoms such as shortness of breath, decreased exercise tolerance, and extremity edema.    Expected Outcomes Improve functional capacity of life;Short term: Attendance in program 2-3 days a week with increased exercise capacity. Reported lower sodium intake. Reported increased fruit and vegetable intake. Reports medication compliance.;Short term: Daily weights obtained and reported for increase. Utilizing diuretic protocols set by physician.;Long term: Adoption of self-care skills and reduction of barriers for early signs and symptoms recognition and intervention leading to self-care maintenance.     Hypertension Yes    Intervention Provide education on lifestyle modifcations including regular physical activity/exercise, weight management, moderate sodium restriction and increased consumption of fresh fruit, vegetables, and low fat dairy, alcohol moderation, and smoking cessation.;Monitor prescription use compliance.    Expected Outcomes Short Term: Continued assessment and intervention until BP is < 140/35mm HG in hypertensive participants. < 130/62mm HG in hypertensive participants with diabetes, heart failure or chronic kidney disease.;Long Term: Maintenance of blood pressure at goal levels.    Lipids Yes    Intervention Provide education and support for participant on nutrition & aerobic/resistive exercise along with prescribed medications to achieve LDL 70mg , HDL >40mg .    Expected Outcomes Short Term: Participant states understanding of desired cholesterol values and is compliant with medications prescribed. Participant is following exercise prescription and nutrition guidelines.;Long Term: Cholesterol controlled with medications as prescribed, with individualized exercise RX and with personalized nutrition plan. Value goals: LDL < 70mg , HDL > 40 mg.          Education:Diabetes - Individual verbal and written instruction to review signs/symptoms of diabetes, desired ranges of glucose level fasting, after meals and with exercise. Acknowledge that pre and post exercise glucose checks will be done for 3 sessions at entry of program. Flowsheet Row Cardiac Rehab from 01/13/2020 in [facility] Cardiac and Pulmonary Rehab  Date 01/13/20  Educator [facility]  Instruction Review Code 1- Verbalizes Understanding    Core Components/Risk Factors/Patient Goals Review:   Goals and Risk Factor Review     Row Name 06/13/23 1551 07/11/23 1603 10/07/23 1654         Core Components/Risk Factors/Patient Goals Review   Personal Goals Review Weight Management/Obesity Weight Management/Obesity Weight  Management/Obesity;Hypertension;Diabetes     Review Ed is continuing the program and is working toward losing weight. He is 224 pounds currently. and wants to reach a weight goal of 200 pounds. He is going to work toward losing a few pounds. Ed continues to try to lose some weight. He currently weighed in at 219.7lbs today and would like to get down to a target weight of 200lbs. Ed is still working on his weight goal. He is up at little from his goal weight and weighed  217.2 lbs today. He stopped taking Ozempic. He is monitoring his blood pressure at home a couple times a week. He is not currently monitoring his blood sugar because he can't find a finger prick that will get him enough blood to test it. He asked his doctor about getting a monitor/patch like a dexcom, but is finding out if his insurance will cover it.     Expected Outcomes Short: lose some weight. Long: reach weight goal. Short: Lose 5 lbs. Long: Continue to exercise and diet in order to lose some weight. Short: Get a new monitor at home to properly monitor blood sugar. Long: Continue to monitor blood pressure and blood glucose at home.        Core Components/Risk Factors/Patient Goals at Discharge (Final Review):   Goals and Risk Factor Review - 10/07/23 1654       Core Components/Risk Factors/Patient Goals Review   Personal Goals Review Weight Management/Obesity;Hypertension;Diabetes    Review Ed is still working on his weight goal. He is up at little from his goal weight and weighed 217.2 lbs today. He stopped taking Ozempic. He is monitoring his blood pressure at home a couple times a week. He is not currently monitoring his blood sugar because he can't find a finger prick that will get him enough blood to test it. He asked his doctor about getting a monitor/patch like a dexcom, but is finding out if his insurance will cover it.    Expected Outcomes Short: Get a new monitor at home to properly monitor blood sugar. Long: Continue to  monitor blood pressure and blood glucose at home.          ITP Comments:  ITP Comments     Row Name 05/20/23 1537 05/22/23 0754 06/19/23 1206 07/17/23 1343 08/14/23 1023   ITP Comments First full day of exercise!  Patient was oriented to gym and equipment including functions, settings, policies, and procedures.  Patient's individual exercise prescription and treatment plan were reviewed.  All starting workloads were established based on the results of the 6 minute walk test done at initial orientation visit.  The plan for exercise progression was also introduced and progression will be customized based on patient's performance and goals. 30 Day review completed. Medical Director ITP review done, changes made as directed, and signed approval by Medical Director. New patient new diagnosis. 30 Day review completed. Medical Director ITP review done, changes made as directed, and signed approval by Medical Director. 30 Day review completed. Medical Director ITP review done, changes made as directed, and signed approval by Medical Director. 30 Day review completed. Medical Director ITP review done, changes made as directed, and signed approval by Medical Director.    Row Name 09/11/23 1222 10/09/23 0945 11/06/23 0944       ITP Comments 30 Day review completed. Medical Director ITP review done, changes made as directed, and signed approval by Medical Director. 30 Day review completed. Medical Director ITP review done, changes made as directed, and signed approval by Medical Director. 30 Day review completed. Medical Director ITP review done, changes made as directed, and signed approval by Medical Director.        Comments: 30 day review

## 2023-11-07 ENCOUNTER — Encounter: Admitting: Emergency Medicine

## 2023-11-07 DIAGNOSIS — Z955 Presence of coronary angioplasty implant and graft: Secondary | ICD-10-CM | POA: Diagnosis not present

## 2023-11-07 NOTE — Progress Notes (Signed)
 Daily Session Note  Patient Details  Name: Devin [name]. MRN: [id number] Date of Birth: [birth year] Referring Provider:   Flowsheet Row Cardiac Rehab from 05/20/2023 in [facility] Cardiac and Pulmonary Rehab  Referring Provider Dr. Denyse Bathe    Encounter Date: 11/07/2023  Check In:  Session Check In - 11/07/23 1536       Check-In   Supervising physician immediately available to respond to emergencies See telemetry face sheet for immediately available ER MD    Location [facility]-Cardiac & Pulmonary Rehab    Staff Present Devaughn Jaeger, BS, Exercise Physiologist;Maxon Conetta BS, Exercise Physiologist;Joseph Rolinda RCP,RRT,BSRT;Travion Ke RN,BSN    Virtual Visit No    Medication changes reported     No    Fall or balance concerns reported    No    Tobacco Cessation No Change    Warm-up and Cool-down Performed on first and last piece of equipment    Resistance Training Performed Yes    VAD Patient? No    PAD/SET Patient? No      Pain Assessment   Currently in Pain? No/denies             Social History   Tobacco Use  Smoking Status Former  Smokeless Tobacco Never  Tobacco Comments   Quit over 40 years ago    Goals Met:  Independence with exercise equipment Exercise tolerated well No report of concerns or symptoms today Strength training completed today  Goals Unmet:  Not Applicable  Comments: Pt able to follow exercise prescription today without complaint.  Will continue to monitor for progression.    Dr. Oneil Pinal is Medical Director for [facility] Cardiac Rehabilitation.  Dr. Fuad Aleskerov is Medical Director for [facility] Pulmonary Rehabilitation.

## 2023-11-14 ENCOUNTER — Ambulatory Visit

## 2023-11-18 ENCOUNTER — Ambulatory Visit

## 2023-11-18 DIAGNOSIS — I25700 Atherosclerosis of coronary artery bypass graft(s), unspecified, with unstable angina pectoris: Secondary | ICD-10-CM

## 2023-11-18 DIAGNOSIS — I5020 Unspecified systolic (congestive) heart failure: Secondary | ICD-10-CM

## 2023-11-18 DIAGNOSIS — R0789 Other chest pain: Secondary | ICD-10-CM

## 2023-11-18 DIAGNOSIS — R0602 Shortness of breath: Secondary | ICD-10-CM

## 2023-11-18 DIAGNOSIS — I4891 Unspecified atrial fibrillation: Secondary | ICD-10-CM

## 2023-11-18 DIAGNOSIS — I2510 Atherosclerotic heart disease of native coronary artery without angina pectoris: Secondary | ICD-10-CM

## 2023-11-18 MED ORDER — TECHNETIUM TC 99M SESTAMIBI GENERIC - CARDIOLITE: 30.0000 | Freq: Once | INTRAVENOUS | Status: AC | PRN

## 2023-11-18 MED ORDER — TECHNETIUM TC 99M SESTAMIBI GENERIC - CARDIOLITE
10.0000 | Freq: Once | INTRAVENOUS | Status: AC | PRN
Start: 2023-11-18 — End: 2023-11-18
  Administered 2023-11-18: 10 via INTRAVENOUS

## 2023-11-19 ENCOUNTER — Other Ambulatory Visit: Payer: Self-pay | Admitting: Cardiovascular Disease

## 2023-11-19 DIAGNOSIS — I2510 Atherosclerotic heart disease of native coronary artery without angina pectoris: Secondary | ICD-10-CM

## 2023-11-20 ENCOUNTER — Ambulatory Visit (INDEPENDENT_AMBULATORY_CARE_PROVIDER_SITE_OTHER)

## 2023-11-20 DIAGNOSIS — R0602 Shortness of breath: Secondary | ICD-10-CM

## 2023-11-20 DIAGNOSIS — I25700 Atherosclerosis of coronary artery bypass graft(s), unspecified, with unstable angina pectoris: Secondary | ICD-10-CM

## 2023-11-20 DIAGNOSIS — I371 Nonrheumatic pulmonary valve insufficiency: Secondary | ICD-10-CM

## 2023-11-20 DIAGNOSIS — I361 Nonrheumatic tricuspid (valve) insufficiency: Secondary | ICD-10-CM

## 2023-11-20 DIAGNOSIS — I2510 Atherosclerotic heart disease of native coronary artery without angina pectoris: Secondary | ICD-10-CM

## 2023-11-20 DIAGNOSIS — I4891 Unspecified atrial fibrillation: Secondary | ICD-10-CM

## 2023-11-20 DIAGNOSIS — I340 Nonrheumatic mitral (valve) insufficiency: Secondary | ICD-10-CM | POA: Diagnosis not present

## 2023-11-20 DIAGNOSIS — I5020 Unspecified systolic (congestive) heart failure: Secondary | ICD-10-CM

## 2023-11-20 DIAGNOSIS — R0789 Other chest pain: Secondary | ICD-10-CM

## 2023-11-21 ENCOUNTER — Other Ambulatory Visit

## 2023-11-21 ENCOUNTER — Encounter: Attending: Cardiovascular Disease | Admitting: Emergency Medicine

## 2023-11-21 DIAGNOSIS — Z955 Presence of coronary angioplasty implant and graft: Secondary | ICD-10-CM | POA: Insufficient documentation

## 2023-11-21 NOTE — Progress Notes (Signed)
 Daily Session Note  Patient Details  Name: Devin [name] [facility]. MRN: [id number] Date of Birth: [birth year] Referring Provider:   Flowsheet Row Cardiac Rehab from 05/20/2023 in [facility]  Referring Provider Dr. Denyse Bathe    Encounter Date: 11/21/2023  Check In:  Session Check In - 11/21/23 1559       Check-In   Supervising physician immediately available to respond to emergencies See telemetry face sheet for immediately available ER MD    Location [facility]-Card[facility]    Staff Present Rollene Paterson, MS, Exercise Physiologist;Noah Tickle, BS, Exercise Physiologist;Joseph Rolinda RCP,RRT,BSRT;Jameshia Hayashida RN,BSN    Virtual Visit No    Medication changes reported     No    Fall or balance concerns reported    No    Tobacco Cessation No Change    Warm-up and Cool-down Performed on first and last piece of equipment    Resistance Training Performed Yes    VAD Patient? No    PAD/SET Patient? No      Pain Assessment   Currently in Pain? No/denies             Social History   Tobacco Use  Smoking Status Former  Smokeless Tobacco Never  Tobacco Comments   Quit over 40 years ago    Goals Met:  Independence with exercise equipment Exercise tolerated well No report of concerns or symptoms today Strength training completed today  Goals Unmet:  Not Applicable  Comments:  Maxmillian graduated today from  rehab with 36 sessions completed.  Details of the patient's exercise prescription and what He needs to do in order to continue the prescription and progress were discussed with patient.  Patient was given a copy of prescription and goals.  Patient verbalized understanding. Hogan plans to continue to exercise by attending the [facility].     Dr. Oneil Pinal is Medical Director for [facility] Card[facility] Rehabilitation.  Dr. Fuad Aleskerov is Medical Director for [facility] Pulmonary Rehabilitation.

## 2023-11-21 NOTE — Progress Notes (Signed)
 Discharge Summary   Devin Kloepfer Yurchak Jr.  [birth year]   Devin [name] graduated today from  rehab with 36 sessions completed.  Details of the patient's exercise prescription and what He needs to do in order to continue the prescription and progress were discussed with patient.  Patient was given a copy of prescription and goals.  Patient verbalized understanding. Devin [name] plans to continue to exercise by attending the [facility].   6 Minute Walk     Row Name 10/17/23 1559         6 Minute Walk   Phase Discharge     Distance 625 feet     Distance % Change -13 %     Distance Feet Change -95 ft     Walk Time 6 minutes     # of Rest Breaks 0     MPH 1.18     METS 1.1     RPE 15     Perceived Dyspnea  0     VO2 Peak 3.86     Symptoms Yes (comment)     Comments lower leg fatigue     Resting HR 92 bpm     Resting BP 94/52     Resting Oxygen Saturation  96 %     Exercise Oxygen Saturation  during 6 min walk 96 %     Max Ex. HR 102 bpm     Max Ex. BP 122/60

## 2023-11-21 NOTE — Progress Notes (Signed)
 Cardiac Individual Treatment Plan  Patient Details  Name: Devin [name]. MRN: [id number] Date of Birth: 1945/05/29 Referring Provider:   Flowsheet Row Cardiac Rehab from 05/20/2023 in [facility] Cardiac and Pulmonary Rehab  Referring Provider Dr. Denyse Bathe    Initial Encounter Date:  Flowsheet Row Cardiac Rehab from 05/20/2023 in [facility] Cardiac and Pulmonary Rehab  Date 05/20/23    Visit Diagnosis: Status post coronary artery stent placement  Patient's Home Medications on Admission:  Current Outpatient Medications:    albuterol  (VENTOLIN  HFA) 108 (90 Base) MCG/ACT inhaler, Inhale 2 puffs into the lungs every 6 (six) hours as needed for wheezing or shortness of breath., Disp: , Rfl:    amiodarone  (PACERONE ) 200 MG tablet, Take 1 tablet (200 mg total) by mouth daily., Disp: 30 tablet, Rfl: 2   atorvastatin  (LIPITOR ) 80 MG tablet, Take 80 mg by mouth daily., Disp: , Rfl:    buPROPion  (WELLBUTRIN  XL) 300 MG 24 hr tablet, Take 300 mg by mouth daily., Disp: , Rfl:    ELIQUIS  5 MG TABS tablet, TAKE 1 TABLET BY MOUTH TWICE A DAY, Disp: 60 tablet, Rfl: 3   ENTRESTO  97-103 MG, TAKE 1 TABLET BY MOUTH TWICE A DAY, Disp: 180 tablet, Rfl: 1   EPINEPHrine  0.3 mg/0.3 mL IJ SOAJ injection, Inject 0.3 mg into the muscle as needed for anaphylaxis., Disp: , Rfl:    ezetimibe  (ZETIA ) 10 MG tablet, Take 10 mg by mouth daily., Disp: , Rfl:    fluticasone  (FLONASE ) 50 MCG/ACT nasal spray, SPRAY 2 SPRAYS INTO EACH NOSTRIL EVERY DAY, Disp: , Rfl:    gabapentin  (NEURONTIN ) 300 MG capsule, Take 400 mg by mouth in the morning, at noon, and at bedtime., Disp: , Rfl:    isosorbide  mononitrate (IMDUR ) 30 MG 24 hr tablet, Take 1 tablet (30 mg total) by mouth daily., Disp: 30 tablet, Rfl: 1   loratadine  (CLARITIN ) 10 MG tablet, Take 10 mg by mouth daily., Disp: , Rfl:    metFORMIN  (GLUCOPHAGE ) 500 MG tablet, Take 1,000 mg by mouth 2 (two) times daily., Disp: , Rfl:    metoprolol  succinate (TOPROL -XL) 25 MG 24 hr  tablet, TAKE 1 TAB BY MOUTH ONCE DAILY, Disp: 90 tablet, Rfl: 2   nitroGLYCERIN  (NITROSTAT ) 0.4 MG SL tablet, Place 0.4 mg under the tongue every 5 (five) minutes as needed for chest pain., Disp: , Rfl:    pantoprazole  (PROTONIX ) 40 MG tablet, TAKE 1 TABLET BY MOUTH EVERY DAY, Disp: 30 tablet, Rfl: 1 No current facility-administered medications for this visit.  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   Hypertension    Ischemic cardiomyopathy    MI, old    Peripheral neuropathy    PFO (patent foramen ovale) 05/2019   s/p repair   Sleep apnea    Spinal stenosis of lumbar region    TIA (transient ischemic attack)     Tobacco  Use: Social History   Tobacco Use  Smoking Status Former  Smokeless Tobacco Never  Tobacco Comments   Quit over 40 years ago    Labs: Review Flowsheet  More data exists      Latest Ref Rng & Units 04/08/2020 07/04/2020 04/12/2022 04/13/2022 10/13/2022  Labs for ITP Cardiac and Pulmonary Rehab  Cholestrol 0 - 200 mg/dL - - - - 894   LDL (calc) 0 - 99 mg/dL - - - - 35   HDL-C >59 mg/dL - - - - 59   Trlycerides <150 mg/dL - - - - 55   Hemoglobin A1c 4.8 - 5.6 % 5.8  - - 5.5  6.3   Bicarbonate 20.0 - 28.0 mmol/L - - 23.9  - -  TCO2 22 - 32 mmol/L - 24  - - -  Acid-base deficit 0.0 - 2.0 mmol/L - - 0.1  - -  O2 Saturation % - - 90.3  - -     Exercise Target Goals: Exercise Program Goal: Individual  exercise prescription set using results from initial 6 min walk test and THRR while considering  patient's activity barriers and safety.   Exercise Prescription Goal: Initial exercise prescription builds to 30-45 minutes a day of aerobic activity, 2-3 days per week.  Home exercise guidelines will be given to patient during program as part of exercise prescription that the participant will acknowledge.   Education: Aerobic Exercise: - Group verbal and visual presentation on the components of exercise prescription. Introduces F.I.T.T principle from ACSM for exercise prescriptions.  Reviews F.I.T.T. principles of aerobic exercise including progression. Written material provided at class time. Flowsheet Row Cardiac Rehab from 11/19/2022 in [facility] Cardiac and Pulmonary Rehab  Education need identified 11/19/22    Education: Resistance Exercise: - Group verbal and visual presentation on the components of exercise prescription. Introduces F.I.T.T principle from ACSM for exercise prescriptions  Reviews F.I.T.T. principles of resistance exercise including progression. Written material provided at class time.    Education: Exercise & Equipment Safety: - Individual verbal instruction and demonstration of equipment use and safety with use of the equipment. Flowsheet Row Cardiac Rehab from 11/19/2022 in [facility] Cardiac and Pulmonary Rehab  Date 11/19/22  Educator NT  Instruction Review Code 1- Verbalizes Understanding    Education: Exercise Physiology & General Exercise Guidelines: - Group verbal and written instruction with models to review the exercise physiology of the cardiovascular system and associated critical values. Provides general exercise guidelines with specific guidelines to those with heart or lung disease. Written material provided at class time. Flowsheet Row Cardiac Rehab from 10/07/2019 in [name] Cardiac and Pulmonary Rehab  Date 09/23/19  Educator AS  Instruction Review Code 1- Verbalizes  Understanding    Education: Flexibility, Balance, Mind/Body Relaxation: - Group verbal and visual presentation with interactive activity on the components of exercise prescription. Introduces F.I.T.T principle from ACSM for exercise prescriptions. Reviews F.I.T.T. principles of flexibility and balance exercise training including progression. Also discusses the mind body connection.  Reviews various relaxation techniques to help reduce and manage stress (i.e. Deep breathing, progressive muscle relaxation, and visualization). Balance handout provided to take home. Written material provided at class time.   Activity Barriers & Risk Stratification:   6 Minute Walk:  6 Minute Walk     Row Name 10/17/23 1559         6 Minute Walk   Phase Discharge     Distance 625 feet     Distance % Change -13 %  Distance Feet Change -95 ft     Walk Time 6 minutes     # of Rest Breaks 0     MPH 1.18     METS 1.1     RPE 15     Perceived Dyspnea  0     VO2 Peak 3.86     Symptoms Yes (comment)     Comments lower leg fatigue     Resting HR 92 bpm     Resting BP 94/52     Resting Oxygen Saturation  96 %     Exercise Oxygen Saturation  during 6 min walk 96 %     Max Ex. HR 102 bpm     Max Ex. BP 122/60        Oxygen Initial Assessment:   Oxygen Re-Evaluation:   Oxygen Discharge (Final Oxygen Re-Evaluation):   Initial Exercise Prescription:   Perform Capillary Blood Glucose checks as needed.  Exercise Prescription Changes:   Exercise Prescription Changes     Row Name 05/28/23 1500 06/12/23 1500 06/27/23 1600 07/10/23 0800 07/22/23 1600     Response to Exercise   Blood Pressure (Admit) 100/62 112/70 138/66 110/60 142/80   Blood Pressure (Exercise) -- 118/60 140/62 126/80 --   Blood Pressure (Exit) 112/64 98/54 118/60 110/58 132/70   Heart Rate (Admit) 65 bpm 78 bpm 63 bpm 77 bpm 77 bpm   Heart Rate (Exercise) 90 bpm 96 bpm 93 bpm 99 bpm 106 bpm   Heart Rate (Exit) 67 bpm 70  bpm 75 bpm 84 bpm 82 bpm   Rating of Perceived Exertion (Exercise) 13 15 13 17 15    Symptoms none none none none none   Comments First two days back in rehab -- -- -- --   Duration Continue with 30 min of aerobic exercise without signs/symptoms of physical distress. Continue with 30 min of aerobic exercise without signs/symptoms of physical distress. Continue with 30 min of aerobic exercise without signs/symptoms of physical distress. Continue with 30 min of aerobic exercise without signs/symptoms of physical distress. Continue with 30 min of aerobic exercise without signs/symptoms of physical distress.   Intensity THRR unchanged THRR unchanged THRR unchanged THRR unchanged THRR unchanged     Progression   Progression Continue to progress workloads to maintain intensity without signs/symptoms of physical distress. Continue to progress workloads to maintain intensity without signs/symptoms of physical distress. Continue to progress workloads to maintain intensity without signs/symptoms of physical distress. Continue to progress workloads to maintain intensity without signs/symptoms of physical distress. Continue to progress workloads to maintain intensity without signs/symptoms of physical distress.   Average METs 2.27 2.09 2.14 1.61 1.82     Resistance Training   Training Prescription Yes Yes Yes Yes Yes   Weight 3 lb 3 lb 3 lb 5 lb 5 lb   Reps 10-15 10-15 10-15 10-15 10-15     Interval Training   Interval Training No No No No No     NuStep   Level 5 5  T6: 3 4 3 3   T6: level 3   Minutes 15 15 15 15 15    METs 2.4 3  T6: 2 2.4 1.5 1.7  T6: 1.9 METs     Arm Ergometer   Level -- -- -- 1 --   Minutes -- -- -- 15 --   METs -- -- -- 1 --     T5 Nustep   Level -- -- -- 3  T6 --   Minutes -- -- --  15 --   METs -- -- -- 1.7 --     Track   Laps 15 25 22 17 17    Minutes 15 15 15 15 15    METs 1.82 2.36 2.2 1.92 1.92     Oxygen   Maintain Oxygen Saturation 88% or higher 88% or higher 88%  or higher 88% or higher 88% or higher    Row Name 08/08/23 1700 09/04/23 1400 09/16/23 1600 10/03/23 1500 10/15/23 1400     Response to Exercise   Blood Pressure (Admit) 112/70 110/60 106/60 106/60 100/60   Blood Pressure (Exit) 118/70 100/68 120/62 102/58 116/58   Heart Rate (Admit) 75 bpm 73 bpm 69 bpm 103 bpm 84 bpm   Heart Rate (Exercise) 100 bpm 101 bpm 82 bpm 103 bpm 92 bpm   Heart Rate (Exit) 79 bpm 73 bpm 73 bpm 77 bpm 66 bpm   Rating of Perceived Exertion (Exercise) 14 13 12 13 14    Symptoms none none none none none   Duration Continue with 30 min of aerobic exercise without signs/symptoms of physical distress. Continue with 30 min of aerobic exercise without signs/symptoms of physical distress. Continue with 30 min of aerobic exercise without signs/symptoms of physical distress. Continue with 30 min of aerobic exercise without signs/symptoms of physical distress. Continue with 30 min of aerobic exercise without signs/symptoms of physical distress.   Intensity THRR unchanged THRR unchanged THRR unchanged THRR unchanged THRR unchanged     Progression   Progression Continue to progress workloads to maintain intensity without signs/symptoms of physical distress. Continue to progress workloads to maintain intensity without signs/symptoms of physical distress. Continue to progress workloads to maintain intensity without signs/symptoms of physical distress. Continue to progress workloads to maintain intensity without signs/symptoms of physical distress. Continue to progress workloads to maintain intensity without signs/symptoms of physical distress.   Average METs 1.78 1.84 2.04 2.19 1.92     Resistance Training   Training Prescription Yes Yes Yes Yes Yes   Weight 5 lb 5 lb 5 lb 5 lb 5 lb   Reps 10-15 10-15 10-15 10-15 10-15     Interval Training   Interval Training No No No No No     NuStep   Level 3 3 4 5 5    Minutes 15 15 15 15 15    METs 2 2 2.3 3 1.9     T5 Nustep   Level 1   T6 -- -- -- --   Minutes 15 -- -- -- --   METs 1.5 -- -- -- --     Track   Laps 17 16 4 20 18    Minutes 15 15 15 15 15    METs 1.92 1.87 1.27 2.09 1.98     Oxygen   Maintain Oxygen Saturation 88% or higher 88% or higher 88% or higher 88% or higher 88% or higher    Row Name 11/01/23 0800             Response to Exercise   Blood Pressure (Admit) 132/70       Blood Pressure (Exit) 98/54       Heart Rate (Admit) 63 bpm       Heart Rate (Exercise) 98 bpm       Heart Rate (Exit) 67 bpm       Rating of Perceived Exertion (Exercise) 15       Symptoms none       Duration Continue with 30 min of aerobic exercise without signs/symptoms of  physical distress.       Intensity THRR unchanged         Progression   Progression Continue to progress workloads to maintain intensity without signs/symptoms of physical distress.       Average METs 1.66         Resistance Training   Training Prescription Yes       Weight 5 lb       Reps 10-15         Interval Training   Interval Training No         NuStep   Level 3       Minutes 15       METs 2.1         Track   Laps 12       Minutes 15       METs 1.53         Oxygen   Maintain Oxygen Saturation 88% or higher          Exercise Comments:   Exercise Goals and Review:   Exercise Goals Re-Evaluation :  Exercise Goals Re-Evaluation     Row Name 05/28/23 1518 06/12/23 1553 06/27/23 1633 07/10/23 0817 07/22/23 1620     Exercise Goal Re-Evaluation   Exercise Goals Review Increase Physical Activity;Increase Strength and Stamina;Understanding of Exercise Prescription Increase Physical Activity;Increase Strength and Stamina;Understanding of Exercise Prescription Increase Physical Activity;Increase Strength and Stamina;Understanding of Exercise Prescription Increase Physical Activity;Increase Strength and Stamina;Understanding of Exercise Prescription Increase Physical Activity;Increase Strength and Stamina;Understanding of Exercise  Prescription   Comments Devin [name] did well in his first two sessions back in rehab since returning with a new referral. He worked at level 5 on the T4 nustep and was able to walk 15 laps on the track. He also did well with 3 lb hand weights for resistance training. We will continue to monitor his progress in the program. Devin [name] is doing well in rehab. He increased to 25 laps walked on the track and continues to use 3 lb hand weights for resistance training. He also has stayed consistent at level 3 on the T6 and level 5 on the T4 nustep. We will continue to monitor his progress in the program. Devin [name] continues to do well in rehab. He was unable to make any workload increases during this review, but was however able to maintain his intensity on both the track and the T4 nustep. We will continue to monitor his progress in the program. Devin [name] is doing well in rehab. He increased to 5lb handweights for resistance. He maintained level 3 on the T6 and level 1 on the arm ergometer. He decreased to level 3 on the T4 from level 4. He also decreased his laps to 17 on the track from 22. We will continue to monitor his progress in the program. Devin [name] is doing well in rehab. He maintained his workloads at level 3 on both the T6 and T4 nustep machines. He also continues to walk 17 laps on the track. We will continue to monitor his progress in the program.   Expected Outcomes Short: Continue to follow current exercise prescription. Long: Continue exercise to improve strength and stamina. Short: Continue to push for more laps on the track. Long: Continue exercise to improve strength and stamina. Short: Continue to push for more track laps, increase to level 5 on the T4 nustep. Long: Continue exercise to improve strength and stamina. Short: Push for more laps on the track to get back to  22 laps and try level 4 on the T4 and T6 nusteps. Long: Continue exercise to improve strength and stamina. Short: Continue to push for more laps on the track. Long:  Continue exercise to improve strength and stamina.    Row Name 08/08/23 1742 08/22/23 1758 09/04/23 1456 09/16/23 1615 10/03/23 1559     Exercise Goal Re-Evaluation   Exercise Goals Review Increase Physical Activity;Increase Strength and Stamina;Understanding of Exercise Prescription Increase Physical Activity;Increase Strength and Stamina;Understanding of Exercise Prescription Increase Physical Activity;Increase Strength and Stamina;Understanding of Exercise Prescription Increase Physical Activity;Increase Strength and Stamina;Understanding of Exercise Prescription Increase Physical Activity;Increase Strength and Stamina;Understanding of Exercise Prescription   Comments Devin [name] is doing well in rehab. He maintained walking 17 laps on the track and his workload at level 3 on T4 nustep. He decreased to level 1 on the T6 nustep from level 3. We will continue to monitor his progress in the program. Devin [name] has not attended since 5/22 as he has been out sick with pneumonia. He plans to return the week of 6/9. We will continue to monitor his progress when he returns. Devin [name] returned to rehab in this review after being out sick with pneumonia. Devin [name] is doing well in rehab. He maintained level 3 on the T4 nustep. He decreased his laps by 1 to 16 laps on the track. We will continue to monitor his progress in the program. Devin [name] has done well in rehab. He was able to increase from level 3 to 4 on the T4 nustep. He was also able to walk the track, but was only able to walk 4 laps. We will continue to monitor his progress in the program. Devin [name] is doing well in rehab. He was able to increase from level 4 to level 5 on the T4 nustep. He was also able to walk the track, and increased up to 20 laps. We will continue to monitor his progress in the program.   Expected Outcomes Short: Increase back to level 3 on the T6 nustep and push for more laps on the track. Long: Continue exercise to improve strength and stamina. Short: Return to rehab once  recovered. Long: Continue exercise to improve strength and stamina. Short: Try level 4 on the T4 nustep and get back to 17 laps on the track. Long: Continue exercise to improve strength and stamina. Short: Return to 17 laps on track. Long: Continue exercise to improve strength and stamina. Short: Continue to progressively increase workloads. Long: Continue exercise to improve strength and stamina.    Row Name 10/07/23 1647 10/15/23 1403 10/28/23 1539 11/01/23 0847       Exercise Goal Re-Evaluation   Exercise Goals Review Increase Physical Activity;Understanding of Exercise Prescription;Increase Strength and Stamina Increase Physical Activity;Understanding of Exercise Prescription;Increase Strength and Stamina Increase Physical Activity;Able to understand and use rate of perceived exertion (RPE) scale;Understanding of Exercise Prescription Increase Physical Activity;Understanding of Exercise Prescription;Increase Strength and Stamina    Comments Devin [name] is not doing much currently with keeping up with his exercise at home. He was walking the park about 2-3 blocks in the evening, but it is currently too hot outside. We discussed indoor options to keep up with exercise on these hot days at home and he plans to walk the aisles of [employer] as it is a similar distance. Devin [name] has only attended rehab twice since the last review. He continues to work at level 5 on the T4 nustep and walked 18 laps on the track. We will continue to monitor his  progress in the program. Follow up on home exercise was done. Devin [name] reports that he has not yet added extra days of independent exercise as recommended during his home exercise review, but he has joined the [employer] [name] and is planning to try to go there 2 days a week on off days of rehab to help increase the frequency of his exercise. He is aiming to have 4-5 days a week of exercise total. Devin [name] is doing well in rehab and is close to graduating from the program. He recently completed  his post and decreased by 13%. He has done well continuing to walk the track and use the T4 nustep for aerobic exercise. We will continue to monitor his progress until he graduates from the program.    Expected Outcomes Short: Get back to exercising at home. Long: Continue exercising at home to improve strength and stamina. Short: Attend rehab more consistently. Long: Continue exercise to improve strength and stamina. Short: start adding 1-2 days a week of exercise at the [facility] on off days of rehab. Long: maintain independent exercise routine upon graduation from program. Short: Graduate. Long: Continue to exercise independently.       Discharge Exercise Prescription (Final Exercise Prescription Changes):  Exercise Prescription Changes - 11/01/23 0800       Response to Exercise   Blood Pressure (Admit) 132/70    Blood Pressure (Exit) 98/54    Heart Rate (Admit) 63 bpm    Heart Rate (Exercise) 98 bpm    Heart Rate (Exit) 67 bpm    Rating of Perceived Exertion (Exercise) 15    Symptoms none    Duration Continue with 30 min of aerobic exercise without signs/symptoms of physical distress.    Intensity THRR unchanged      Progression   Progression Continue to progress workloads to maintain intensity without signs/symptoms of physical distress.    Average METs 1.66      Resistance Training   Training Prescription Yes    Weight 5 lb    Reps 10-15      Interval Training   Interval Training No      NuStep   Level 3    Minutes 15    METs 2.1      Track   Laps 12    Minutes 15    METs 1.53      Oxygen   Maintain Oxygen Saturation 88% or higher          Nutrition:  Target Goals: Understanding of nutrition guidelines, daily intake of sodium 1500mg , cholesterol 200mg , calories 30% from fat and 7% or less from saturated fats, daily to have 5 or more servings of fruits and vegetables.  Education: Nutrition 1 -Group instruction provided by verbal, written material,  interactive activities, discussions, models, and posters to present general guidelines for heart healthy nutrition including macronutrients, label reading, and promoting whole foods over processed counterparts. Education serves as Pensions consultant of discussion of heart healthy eating for all. Written material provided at class time.    Education: Nutrition 2 -Group instruction provided by verbal, written material, interactive activities, discussions, models, and posters to present general guidelines for heart healthy nutrition including sodium, cholesterol, and saturated fat. Providing guidance of habit forming to improve blood pressure, cholesterol, and body weight. Written material provided at class time.     Biometrics:   Post Biometrics - 10/17/23 1605        Post  Biometrics   Height 5' 11.5 (1.816 m)  Weight 216 lb 6.4 oz (98.2 kg)    Waist Circumference 44.5 inches    Hip Circumference 42.5 inches    Waist to Hip Ratio 1.05 %    BMI (Calculated) 29.76    Single Leg Stand 1.4 seconds          Nutrition Therapy Plan and Nutrition Goals:  Nutrition Therapy & Goals - 10/07/23 1653       Nutrition Therapy   RD appointment deferred --          Nutrition Assessments:  MEDIFICTS Score Key: >=70 Need to make dietary changes  40-70 Heart Healthy Diet <= 40 Therapeutic Level Cholesterol Diet  Flowsheet Row Cardiac Rehab from 10/24/2023 in [facility] Cardiac and Pulmonary Rehab  Picture Your Plate Total Score on Discharge 73   Picture Your Plate Scores: <59 Unhealthy dietary pattern with much room for improvement. 41-50 Dietary pattern unlikely to meet recommendations for good health and room for improvement. 51-60 More healthful dietary pattern, with some room for improvement.  >60 Healthy dietary pattern, although there may be some specific behaviors that could be improved.    Nutrition Goals Re-Evaluation:  Nutrition Goals Re-Evaluation     Row Name 06/13/23 1603  07/11/23 1601 10/07/23 1653         Goals   Comment Patient deferred RD appointment. Patient deferred RD appointment. Patient deferred RD appointment.        Nutrition Goals Discharge (Final Nutrition Goals Re-Evaluation):  Nutrition Goals Re-Evaluation - 10/07/23 1653       Goals   Comment Patient deferred RD appointment.          Psychosocial: Target Goals: Acknowledge presence or absence of significant depression and/or stress, maximize coping skills, provide positive support system. Participant is able to verbalize types and ability to use techniques and skills needed for reducing stress and depression.   Education: Stress, Anxiety, and Depression - Group verbal and visual presentation to define topics covered.  Reviews how body is impacted by stress, anxiety, and depression.  Also discusses healthy ways to reduce stress and to treat/manage anxiety and depression. Written material provided at class time.   Education: Sleep Hygiene -Provides group verbal and written instruction about how sleep can affect your health.  Define sleep hygiene, discuss sleep cycles and impact of sleep habits. Review good sleep hygiene tips.   Initial Review & Psychosocial Screening:   Quality of Life Scores:   Quality of Life - 10/24/23 1558       Quality of Life Scores   Health/Function Post 26 %    Socioeconomic Post 27.6 %    Psych/Spiritual Post 30 %    Family Post 28.5 %    GLOBAL Post 27.48 %         Scores of 19 and below usually indicate a poorer quality of life in these areas.  A difference of  2-3 points is a clinically meaningful difference.  A difference of 2-3 points in the total score of the Quality of Life Index has been associated with significant improvement in overall quality of life, self-image, physical symptoms, and general health in studies assessing change in quality of life.  PHQ-9: Review Flowsheet  More data exists      10/24/2023 06/13/2023 05/16/2023 11/19/2022  01/12/2020  Depression screen PHQ 2/9  Decreased Interest 0 0 0 1 3  Down, Depressed, Hopeless 0 0 0 1 0  PHQ - 2 Score 0 0 0 2 3  Altered sleeping 0 0 0  1 1  Tired, decreased energy 0 1 3 1 2   Change in appetite 0 1 3 1 1   Feeling bad or failure about yourself  0 0 0 0 0  Trouble concentrating 0 0 0 0 0  Moving slowly or fidgety/restless 0 0 0 0 0  Suicidal thoughts 0 0 0 0 1  PHQ-9 Score 0 2 6 5 8   Difficult doing work/chores - Not difficult at all Not difficult at all Somewhat difficult Not difficult at all   Interpretation of Total Score  Total Score Depression Severity:  1-4 = Minimal depression, 5-9 = Mild depression, 10-14 = Moderate depression, 15-19 = Moderately severe depression, 20-27 = Severe depression   Psychosocial Evaluation and Intervention:   Psychosocial Re-Evaluation:  Psychosocial Re-Evaluation     Row Name 06/13/23 1557 07/11/23 1549 10/07/23 1650         Psychosocial Re-Evaluation   Current issues with Current Psychotropic Meds Current Psychotropic Meds Current Psychotropic Meds;Current Sleep Concerns     Comments Reviewed patient health questionnaire (PHQ-9) with patient for follow up. Previously, patients score indicated signs/symptoms of depression.  Reviewed to see if patient is improving symptom wise while in program.  Score improved and patient states that it is because he is able to exercise and have more energy. Devin [name] states that he is still taking his anxiety/depression medication. He also states that he is sleeping well, and is not having any stressors at this time. He can look to his lady friend when he is having stress, as well as exercise. Devin [name] stays no current stressors other than trying to figure out how to get his home exercise in and we discussed indoor options and to schedule it in his day. He is still taking his anxiety/depression medications. He is still not sleeping well and it is getting worse. He has tried different relaxation methods to try  and help him fall asleep. He can look to his lady friend for support has help when he is having stress.     Expected Outcomes Short: Continue to attend [facility] regularly for regular exercise and social engagement. Long: Continue to improve symptoms and manage a positive mental state. Short: Continue to attend [facility] regularly for regular exercise and social engagement. Long: Continue to improve symptoms and manage a positive mental state. Short: Continue to attend [facility] regularly for regular exercise and social engagement and exercise at home. Long: Continue to improve symptoms and manage a positive mental state.     Interventions Encouraged to attend Cardiac Rehabilitation for the exercise Encouraged to attend Cardiac Rehabilitation for the exercise Encouraged to attend Cardiac Rehabilitation for the exercise     Continue Psychosocial Services  Follow up required by staff Follow up required by staff Follow up required by staff        Psychosocial Discharge (Final Psychosocial Re-Evaluation):  Psychosocial Re-Evaluation - 10/07/23 1650       Psychosocial Re-Evaluation   Current issues with Current Psychotropic Meds;Current Sleep Concerns    Comments Devin [name] stays no current stressors other than trying to figure out how to get his home exercise in and we discussed indoor options and to schedule it in his day. He is still taking his anxiety/depression medications. He is still not sleeping well and it is getting worse. He has tried different relaxation methods to try and help him fall asleep. He can look to his lady friend for support has help when he is having stress.    Expected Outcomes Short: Continue  to attend [facility] regularly for regular exercise and social engagement and exercise at home. Long: Continue to improve symptoms and manage a positive mental state.    Interventions Encouraged to attend Cardiac Rehabilitation for the exercise    Continue Psychosocial Services  Follow up  required by staff          Vocational Rehabilitation: Provide vocational rehab assistance to qualifying candidates.   Vocational Rehab Evaluation & Intervention:   Education: Education Goals: Education classes will be provided on a variety of topics geared toward better understanding of heart health and risk factor modification. Participant will state understanding/return demonstration of topics presented as noted by education test scores.  Learning Barriers/Preferences:   General Cardiac Education Topics:  AED/CPR: - Group verbal and written instruction with the use of models to demonstrate the basic use of the AED with the basic ABC's of resuscitation.   Test and Procedures: - Group verbal and visual presentation and models provide information about basic cardiac anatomy and function. Reviews the testing methods done to diagnose heart disease and the outcomes of the test results. Describes the treatment choices: Medical Management, Angioplasty, or Coronary Bypass Surgery for treating various heart conditions including Myocardial Infarction, Angina, Valve Disease, and Cardiac Arrhythmias. Written material provided at class time. Flowsheet Row Cardiac Rehab from 11/19/2022 in [name]  Education need identified 11/19/22    Medication Safety: - Group verbal and visual instruction to review commonly prescribed medications for heart and lung disease. Reviews the medication, class of the drug, and side effects. Includes the steps to properly store meds and maintain the prescription regimen. Written material provided at class time. Flowsheet Row Cardiac Rehab from 10/07/2019 in [facility]  Date 10/07/19  Educator SB  Instruction Review Code 1- Verbalizes Understanding    Intimacy: - Group verbal instruction through game format to discuss how heart and lung disease can affect sexual intimacy. Written material provided at class  time.   Know Your Numbers and Heart Failure: - Group verbal and visual instruction to discuss disease risk factors for cardiac and pulmonary disease and treatment options.  Reviews associated critical values for Overweight/Obesity, Hypertension, Cholesterol, and Diabetes.  Discusses basics of heart failure: signs/symptoms and treatments.  Introduces Heart Failure Zone chart for action plan for heart failure. Written material provided at class time.   Infection Prevention: - Provides verbal and written material to individual with discussion of infection control including proper hand washing and proper equipment cleaning during exercise session. Flowsheet Row Cardiac Rehab from 11/19/2022 in [name]  Date 11/19/22  Educator NT  Instruction Review Code 1- Verbalizes Understanding    Falls Prevention: - Provides verbal and written material to individual with discussion of falls prevention and safety. Flowsheet Row Cardiac Rehab from 11/19/2022 in [name]  Date 11/19/22  Educator NT  Instruction Review Code 1- Verbalizes Understanding    Other: -Provides group and verbal instruction on various topics (see comments)   Knowledge Questionnaire Score:  Knowledge Questionnaire Score - 10/24/23 1558       Knowledge Questionnaire Score   Post Score 20/26          Core Components/Risk Factors/Patient Goals at Admission:   Education:Diabetes - Individual verbal and written instruction to review signs/symptoms of diabetes, desired ranges of glucose level fasting, after meals and with exercise. Acknowledge that pre and post exercise glucose checks will be done for 3 sessions at entry of program.  Flowsheet Row Cardiac Rehab from 01/13/2020 in [name]  Date 01/13/20  Educator [name]  Instruction Review Code 1- Verbalizes Understanding    Core Components/Risk Factors/Patient Goals Review:   Goals and Risk Factor Review      Row Name 06/13/23 1551 07/11/23 1603 10/07/23 1654         Core Components/Risk Factors/Patient Goals Review   Personal Goals Review Weight Management/Obesity Weight Management/Obesity Weight Management/Obesity;Hypertension;Diabetes     Review Devin [name] is continuing the program and is working toward losing weight. He is 224 pounds currently. and wants to reach a weight goal of 200 pounds. He is going to work toward losing a few pounds. Devin [name] continues to try to lose some weight. He currently weighed in at 219.7lbs today and would like to get down to a target weight of 200lbs. Devin [name] is still working on his weight goal. He is up at little from his goal weight and weighed 217.2 lbs today. He stopped taking Ozempic. He is monitoring his blood pressure at home a couple times a week. He is not currently monitoring his blood sugar because he can't find a finger prick that will get him enough blood to test it. He asked his doctor about getting a monitor/patch like a dexcom, but is finding out if his insurance will cover it.     Expected Outcomes Short: lose some weight. Long: reach weight goal. Short: Lose 5 lbs. Long: Continue to exercise and diet in order to lose some weight. Short: Get a new monitor at home to properly monitor blood sugar. Long: Continue to monitor blood pressure and blood glucose at home.        Core Components/Risk Factors/Patient Goals at Discharge (Final Review):   Goals and Risk Factor Review - 10/07/23 1654       Core Components/Risk Factors/Patient Goals Review   Personal Goals Review Weight Management/Obesity;Hypertension;Diabetes    Review Devin [name] is still working on his weight goal. He is up at little from his goal weight and weighed 217.2 lbs today. He stopped taking Ozempic. He is monitoring his blood pressure at home a couple times a week. He is not currently monitoring his blood sugar because he can't find a finger prick that will get him enough blood to test it. He asked his doctor  about getting a monitor/patch like a dexcom, but is finding out if his insurance will cover it.    Expected Outcomes Short: Get a new monitor at home to properly monitor blood sugar. Long: Continue to monitor blood pressure and blood glucose at home.          ITP Comments:  ITP Comments     Row Name 06/19/23 1206 07/17/23 1343 08/14/23 1023 09/11/23 1222 10/09/23 0945   ITP Comments 30 Day review completed. Medical Director ITP review done, changes made as directed, and signed approval by Medical Director. 30 Day review completed. Medical Director ITP review done, changes made as directed, and signed approval by Medical Director. 30 Day review completed. Medical Director ITP review done, changes made as directed, and signed approval by Medical Director. 30 Day review completed. Medical Director ITP review done, changes made as directed, and signed approval by Medical Director. 30 Day review completed. Medical Director ITP review done, changes made as directed, and signed approval by Medical Director.    Row Name 11/06/23 0944 11/21/23 1608         ITP Comments 30 Day review completed. Medical Director ITP review done, changes  made as directed, and signed approval by Medical Director. Devin [name] graduated today from  rehab with 36 sessions completed.  Details of the patient's exercise prescription and what He needs to do in order to continue the prescription and progress were discussed with patient.  Patient was given a copy of prescription and goals.  Patient verbalized understanding. Devin [name] plans to continue to exercise by attending the [facility].         Comments: Discharge ITP

## 2023-11-26 ENCOUNTER — Ambulatory Visit (INDEPENDENT_AMBULATORY_CARE_PROVIDER_SITE_OTHER): Admitting: Cardiovascular Disease

## 2023-11-26 ENCOUNTER — Encounter: Payer: Self-pay | Admitting: Cardiovascular Disease

## 2023-11-26 VITALS — BP 138/76 | HR 64 | Ht 72.0 in | Wt 218.6 lb

## 2023-11-26 DIAGNOSIS — I7143 Infrarenal abdominal aortic aneurysm, without rupture: Secondary | ICD-10-CM

## 2023-11-26 DIAGNOSIS — I255 Ischemic cardiomyopathy: Secondary | ICD-10-CM

## 2023-11-26 DIAGNOSIS — I10 Essential (primary) hypertension: Secondary | ICD-10-CM

## 2023-11-26 DIAGNOSIS — I5020 Unspecified systolic (congestive) heart failure: Secondary | ICD-10-CM

## 2023-11-26 DIAGNOSIS — I739 Peripheral vascular disease, unspecified: Secondary | ICD-10-CM | POA: Diagnosis not present

## 2023-11-26 DIAGNOSIS — I25700 Atherosclerosis of coronary artery bypass graft(s), unspecified, with unstable angina pectoris: Secondary | ICD-10-CM

## 2023-11-26 DIAGNOSIS — I4891 Unspecified atrial fibrillation: Secondary | ICD-10-CM

## 2023-11-26 NOTE — Progress Notes (Signed)
 Cardiology Office Note   Date:  11/26/2023   ID:  Dallas Lerner Keath Matera., DOB 10/10/1945, MRN [id number]  PCP:  Eliverto Bette Hover, MD  Cardiologist:  Denyse Bathe, MD      History of Present Illness: Kashis Penley. is a 78 y.o. male who presents for  Chief Complaint  Patient presents with   Follow-up    Nst and echo results    Feeling fine.      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   Hypertension    Ischemic cardiomyopathy    MI, old    Peripheral neuropathy    PFO (patent foramen ovale) 05/2019   s/p repair   Sleep apnea    Spinal stenosis of lumbar region    TIA (transient ischemic attack)      Past Surgical History:  Procedure Laterality Date   APPENDECTOMY     BREAST SURGERY Right    benign mass   CARDIOVERSION Right 12/15/2019   Procedure: CARDIOVERSION;  Surgeon: Bathe Denyse LABOR, MD;  Location: [facility] ORS;  Service: Cardiovascular;  Laterality: Right;   CATARACT EXTRACTION, BILATERAL     COLONOSCOPY     CORONARY ANGIOPLASTY WITH STENT PLACEMENT     CORONARY ARTERY BYPASS GRAFT  05/2019   LIMA-LAD   CORONARY STENT INTERVENTION N/A 10/15/2022   Procedure: CORONARY STENT INTERVENTION;  Surgeon: Ammon Blunt, MD;  Location: [facility] INVASIVE CV LAB;  Service: Cardiovascular;  Laterality: N/A;   LEFT HEART CATH N/A 10/15/2022   Procedure: Left Heart Cath;  Surgeon: Ammon Blunt, MD;  Location: [facility] INVASIVE CV LAB;  Service: Cardiovascular;  Laterality: N/A;   LEFT HEART CATH AND CORS/GRAFTS ANGIOGRAPHY N/A 05/21/2019   Procedure: LEFT HEART CATH AND CORONARY ANGIOGRAPHY;   Surgeon: Bathe Denyse LABOR, MD;  Location: [facility] INVASIVE CV LAB;  Service: Cardiovascular;  Laterality: N/A;   LUMBAR LAMINECTOMY/DECOMPRESSION MICRODISCECTOMY N/A 07/04/2020   Procedure: L4-5 LAMINECTOMY;  Surgeon: Bluford Standing, MD;  Location: [facility] ORS;  Service: Neurosurgery;  Laterality: N/A;   REPLACEMENT TOTAL KNEE BILATERAL       Current Outpatient Medications  Medication Sig Dispense Refill   albuterol  (VENTOLIN  HFA) 108 (90 Base) MCG/ACT inhaler Inhale 2 puffs into the lungs every 6 (six) hours as needed for wheezing or shortness of breath.     amiodarone  (PACERONE ) 200 MG tablet Take 1 tablet (200 mg total) by mouth daily. 30 tablet 2   atorvastatin  (LIPITOR ) 80 MG tablet Take 80 mg by mouth daily.     buPROPion  (WELLBUTRIN  XL) 300 MG 24 hr tablet Take 300 mg by mouth daily.     ELIQUIS  5 MG TABS tablet TAKE 1 TABLET BY MOUTH TWICE A DAY 60 tablet 3   ENTRESTO  97-103 MG TAKE 1 TABLET BY MOUTH TWICE A DAY 180 tablet 1   EPINEPHrine  0.3 mg/0.3 mL IJ SOAJ injection Inject 0.3 mg into the muscle as needed for anaphylaxis.     ezetimibe  (ZETIA ) 10 MG tablet Take 10 mg by mouth daily.     fluticasone  (FLONASE ) 50  MCG/ACT nasal spray SPRAY 2 SPRAYS INTO EACH NOSTRIL EVERY DAY     gabapentin  (NEURONTIN ) 300 MG capsule Take 400 mg by mouth in the morning, at noon, and at bedtime.     isosorbide  mononitrate (IMDUR ) 30 MG 24 hr tablet Take 1 tablet (30 mg total) by mouth daily. 30 tablet 1   metFORMIN  (GLUCOPHAGE ) 500 MG tablet Take 1,000 mg by mouth 2 (two) times daily.     metoprolol  succinate (TOPROL -XL) 25 MG 24 hr tablet TAKE 1 TAB BY MOUTH ONCE DAILY 90 tablet 2   nitroGLYCERIN  (NITROSTAT ) 0.4 MG SL tablet Place 0.4 mg under the tongue every 5 (five) minutes as needed for chest pain.     No current facility-administered medications for this visit.   Facility-Administered Medications Ordered in Other Visits  Medication Dose Route Frequency Provider Last Rate Last Admin   technetium  sestamibi generic (CARDIOLITE ) injection 30 millicurie  30 millicurie Intravenous Once PRN Fernand Alter A, MD        Allergies:   Morphine , Yellow jacket venom [bee venom], and Trazodone     Social History:   reports that he has quit smoking. He has never used smokeless tobacco. He reports that he does not currently use alcohol. He reports that he does not use drugs.   Family History:  family history includes Osteoarthritis in his mother; Other (age of onset: 50) in his father.    ROS:     Review of Systems  Constitutional: Negative.   HENT: Negative.    Eyes: Negative.   Respiratory: Negative.    Gastrointestinal: Negative.   Genitourinary: Negative.   Musculoskeletal: Negative.   Skin: Negative.   Neurological: Negative.   Endo/Heme/Allergies: Negative.   Psychiatric/Behavioral: Negative.    All other systems reviewed and are negative.     All other systems are reviewed and negative.    PHYSICAL EXAM: VS:  BP 138/76   Pulse 64   Ht 6' (1.829 m)   Wt 218 lb 9.6 oz (99.2 kg)   SpO2 97%   BMI 29.65 kg/m  , BMI Body mass index is 29.65 kg/m. Last weight:  Wt Readings from Last 3 Encounters:  11/26/23 218 lb 9.6 oz (99.2 kg)  10/24/23 218 lb 3.2 oz (99 kg)  10/17/23 216 lb 6.4 oz (98.2 kg)     Physical Exam Vitals reviewed.  Constitutional:      Appearance: Normal appearance. He is normal weight.  HENT:     Head: Normocephalic.     Nose: Nose normal.     Mouth/Throat:     Mouth: Mucous membranes are moist.  Eyes:     Pupils: Pupils are equal, round, and reactive to light.  Cardiovascular:     Rate and Rhythm: Normal rate and regular rhythm.     Pulses: Normal pulses.     Heart sounds: Normal heart sounds.  Pulmonary:     Effort: Pulmonary effort is normal.  Abdominal:     General: Abdomen is flat. Bowel sounds are normal.  Musculoskeletal:        General: Normal range of motion.     Cervical back: Normal range of motion.  Skin:    General: Skin is  warm.  Neurological:     General: No focal deficit present.     Mental Status: He is alert.  Psychiatric:        Mood and Affect: Mood normal.       EKG:   Recent Labs: No results found  for requested labs within last 365 days.    Lipid Panel    Component Value Date/Time   CHOL 105 10/13/2022 0313   TRIG 55 10/13/2022 0313   HDL 59 10/13/2022 0313   CHOLHDL 1.8 10/13/2022 0313   VLDL 11 10/13/2022 0313   LDLCALC 35 10/13/2022 0313      Other studies Reviewed: Additional studies/ records that were reviewed today include:  Review of the above records demonstrates:       No data to display            ASSESSMENT AND PLAN:    ICD-10-CM   1. Primary hypertension  I10     2. PAD (peripheral artery disease) (HCC)  I73.9    has fatigue in legs, decrease metoprolol  to 1/2 tab daily, wean off    3. Infrarenal abdominal aortic aneurysm (AAA) without rupture (HCC)  I71.43     4. Coronary artery disease involving coronary bypass graft with unstable angina pectoris, unspecified whether native or transplanted heart (HCC)  I25.700     5. Atrial fibrillation status post cardioversion (HCC)  I48.91     6. HFrEF (heart failure with reduced ejection fraction) (HCC)  I50.20     7. Ischemic cardiomyopathy  I25.5    LVEF on echo, 55%, trace MR. Stress test no ischaemia.       Problem List Items Addressed This Visit       Cardiovascular and Mediastinum   CAD (coronary artery disease)   HTN (hypertension) - Primary   AAA (abdominal aortic aneurysm) (HCC)   Atrial fibrillation status post cardioversion [facility])   Ischemic cardiomyopathy   PAD (peripheral artery disease) (HCC)   HFrEF (heart failure with reduced ejection fraction) (HCC)       Disposition:   Return in about 3 months (around 02/25/2024).    Total time spent: 30 minutes  Signed,  Denyse Bathe, MD  11/26/2023 1:22 PM    [facility]

## 2023-12-09 ENCOUNTER — Ambulatory Visit: Admitting: Internal Medicine

## 2023-12-09 ENCOUNTER — Other Ambulatory Visit: Payer: Self-pay

## 2023-12-09 ENCOUNTER — Encounter: Payer: Self-pay | Admitting: Internal Medicine

## 2023-12-09 VITALS — BP 130/78 | HR 68 | Temp 98.1°F | Resp 20 | Ht 72.0 in | Wt 217.7 lb

## 2023-12-09 DIAGNOSIS — I5020 Unspecified systolic (congestive) heart failure: Secondary | ICD-10-CM

## 2023-12-09 DIAGNOSIS — I4891 Unspecified atrial fibrillation: Secondary | ICD-10-CM | POA: Diagnosis not present

## 2023-12-09 DIAGNOSIS — R062 Wheezing: Secondary | ICD-10-CM

## 2023-12-09 DIAGNOSIS — I259 Chronic ischemic heart disease, unspecified: Secondary | ICD-10-CM | POA: Diagnosis not present

## 2023-12-09 MED ORDER — IPRATROPIUM-ALBUTEROL 0.5-2.5 (3) MG/3ML IN SOLN: 3.0000 mL | Freq: Once | RESPIRATORY_TRACT | Status: AC

## 2023-12-09 NOTE — Progress Notes (Signed)
 "  NEW PATIENT Date of Service/Encounter:  12/12/23 Referring provider: Eliverto Bette Hover, * Primary care provider: Eliverto Bette Hover, MD  Subjective:  Devin [name]. is a 78 y.o. male  presenting today for evaluation of dyspnea  History obtained from: chart review and patient.   Discussed the use of AI scribe software for clinical note transcription with the patient, who gave verbal consent to proceed.  History of Present Illness Devin [name]. is a 78 year old male with coronary artery disease and heart failure who presents with shortness of breath and cough.  Dyspnea and cough - Persistent shortness of breath and cough since November 28, 2023 - Initial treatment included prednisone, albuterol , and benzonatate pearls - Doxycycline added on December 04, 2023; completed prednisone course - Symptoms persisted despite treatment - Urgent care visit on December 08, 2023 for worsening symptoms; found to be hypoxic TO 80S and sent to ER - Chest x-ray showed increased interstitial markings; left before further evaluation - Improvement in symptoms after nebulizer treatment - Increased shortness of breath with exertion, such as walking - No history of asthma - No fever, chest pain, weight gain, or leg swelling  Respiratory symptom trajectory - Intermittent use of albuterol  for difficulty breathing over the past year - Respiratory symptoms have become more severe over the past year - Symptoms often progress to bronchitis when he develops a cold  Cardiac history - History of coronary artery disease and heart failure - Cardiac admission in February 2025 with stent placement - No heart failure at the time of stent placement - Currently taking amiodarone   Infectious disease exposure and testing - No recent swab for COVID-19, RSV, or influenza - No sick contacts at home  Functional status and living situation - Lives alone - Feels safe driving    Chart  Review:  PCP: 11/28/23: Dx: URI: given prednisone, albuterol , benzoatate  PCP: 12/04/23: DX: acute bronchitis given doxycycline  UC visit 12/08/23: Pt with lower respiratory infection. Pt with oxygen saturation in the 80s. Pt sent to the ED for further evaluation and management. 1. Lower respiratory infection J22  ED visit 12/08/23: left AMA  CXR: Imaging Results - XR Chest 2 views (12/08/2023 4:57 PM EDT) Impressions  12/08/2023 6:16 PM EDT   Increased diffuse prominence of the interstitium, favored to represent pulmonary edema. Although, atypical infection is in the differential.   ECG: NSR, WITHIN NORMAL LIMITS WHEN COMPARED WITH ECG OF 22-Apr-2023 10:26, VENT. RATE HAS INCREASED by 33 bpm NON-SPECIFIC ST/T WAVE CHANGES ARE LESS EVIDENT   -prior ECG done during admission NSTEMI  - admission pro-BNP was 701  - discharge weight 101kg      Past Medical History: 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   Hypertension    Ischemic cardiomyopathy    MI, old    Peripheral neuropathy    PFO (patent foramen ovale) 05/2019   s/p repair   Sleep apnea    Spinal stenosis of lumbar region    TIA (transient ischemic attack)    Medication List:  Current Outpatient Medications  Medication Sig Dispense Refill   albuterol  (VENTOLIN  HFA) 108 (90 Base) MCG/ACT inhaler Inhale 2 puffs into the lungs every 6 (six) hours as needed for wheezing or shortness of breath.     amiodarone  (PACERONE ) 200 MG tablet Take 1 tablet (200 mg total) by mouth daily. 30 tablet 2   atorvastatin  (LIPITOR ) 80 MG tablet Take 80 mg by mouth daily.     buPROPion  (WELLBUTRIN  XL) 300  MG 24 hr tablet Take 300 mg by mouth daily.     ELIQUIS  5 MG TABS tablet TAKE 1 TABLET BY MOUTH TWICE A DAY 60 tablet 3   ENTRESTO  97-103 MG TAKE 1 TABLET BY MOUTH TWICE A DAY 180 tablet 1   EPINEPHrine  0.3 mg/0.3 mL IJ SOAJ injection Inject 0.3 mg into the muscle as needed for anaphylaxis.     ezetimibe  (ZETIA ) 10 MG tablet Take 10 mg by mouth daily.     fluticasone  (FLONASE ) 50 MCG/ACT nasal spray SPRAY 2 SPRAYS INTO EACH NOSTRIL EVERY DAY     gabapentin  (NEURONTIN ) 300 MG capsule Take 400 mg by mouth in the morning, at noon, and at bedtime.     isosorbide  mononitrate (IMDUR ) 30 MG 24 hr tablet Take 1 tablet (30 mg total) by mouth daily. 30 tablet 1   metFORMIN  (GLUCOPHAGE ) 500 MG tablet Take 1,000 mg by mouth 2 (two) times daily.     metoprolol  succinate (TOPROL -XL) 25 MG 24 hr tablet TAKE 1 TAB BY MOUTH ONCE DAILY 90 tablet 2   nitroGLYCERIN  (NITROSTAT ) 0.4 MG SL tablet Place 0.4 mg under the tongue every 5 (five) minutes as needed for chest pain.     Current Facility-Administered Medications  Medication Dose Route Frequency Provider Last Rate Last Admin   ipratropium-albuterol  (DUONEB) 0.5-2.5 (3) MG/3ML nebulizer solution 3 mL  3 mL Nebulization Once        Facility-Administered Medications Ordered in Other Visits  Medication Dose Route Frequency Provider Last Rate Last Admin   technetium sestamibi generic (CARDIOLITE ) injection 30 millicurie  30 millicurie Intravenous Once PRN Fernand Denyse LABOR, MD       Known Allergies:  Allergies  Allergen Reactions   Morphine  Swelling   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   Trazodone  Other (See Comments)   Past Surgical History: Past Surgical History:  Procedure Laterality Date   APPENDECTOMY     BREAST SURGERY Right    benign mass   CARDIOVERSION Right 12/15/2019   Procedure: CARDIOVERSION;  Surgeon: Fernand Denyse LABOR, MD;  Location: [facility];  Service: Cardiovascular;  Laterality: Right;   CATARACT EXTRACTION, BILATERAL      COLONOSCOPY     CORONARY ANGIOPLASTY WITH STENT PLACEMENT     CORONARY ARTERY BYPASS GRAFT  05/2019   LIMA-LAD   CORONARY STENT INTERVENTION N/A 10/15/2022   Procedure: CORONARY STENT INTERVENTION;  Surgeon: Ammon Blunt, MD;  Location: [facility];  Service: Cardiovascular;  Laterality: N/A;   LEFT HEART CATH N/A 10/15/2022   Procedure: Left Heart Cath;  Surgeon: Ammon Blunt, MD;  Location: [facility];  Service: Cardiovascular;  Laterality: N/A;   LEFT HEART CATH AND CORS/GRAFTS ANGIOGRAPHY N/A 05/21/2019   Procedure: LEFT HEART CATH AND CORONARY ANGIOGRAPHY;  Surgeon: Fernand Denyse LABOR, MD;  Location: [facility];  Service: Cardiovascular;  Laterality: N/A;   LUMBAR LAMINECTOMY/DECOMPRESSION MICRODISCECTOMY N/A 07/04/2020   Procedure: L4-5 LAMINECTOMY;  Surgeon: Bluford Standing, MD;  Location: [facility];  Service: Neurosurgery;  Laterality: N/A;  REPLACEMENT TOTAL KNEE BILATERAL     Family History: Family History  Problem Relation Age of Onset   Osteoarthritis Mother    Other Father 77       MVA   Allergic rhinitis Brother    Social History: [place] lives at home, no water damage.  Hardwood in family room carpet in bedroom.  Central cooling.  Roaches in the house and bed is to get off the floor.  No dust mite precautions.  Currently retired.  No tobacco exposure  ROS:  All other systems negative except as noted per HPI.  Objective:  Blood pressure 130/78, pulse 68, temperature 98.1 F (36.7 C), temperature source Temporal, resp. rate 20, height 6' (1.829 m), weight 217 lb 11.2 oz (98.7 kg), SpO2 95%. Body mass index is 29.53 kg/m. Physical Exam:  General Appearance:  Alert, cooperative, no distress, appears stated age  Head:  Normocephalic, without obvious abnormality, atraumatic  Eyes:  Conjunctiva clear, EOM's intact  Ears EACs normal bilaterally and normal TMs bilaterally  Nose: Nares normal, normal mucosa, no visible anterior polyps,  and septum midline  Throat: Lips, tongue normal; teeth and gums normal, normal posterior oropharynx  Neck: Supple, symmetrical  Lungs:   Difficult and prolonged expiratory phase, improved after nebulizer and crackles in LLBintermittent productive-sounding cough  Heart:  regular rate and rhythm and no murmur, Appears well perfused  Extremities: No edema  Skin: Skin color, texture, turgor normal and no rashes or lesions on visualized portions of skin  Neurologic: No gross deficits   Diagnostics: Spirometry:  Tracings reviewed. His effort: Good reproducible efforts. FVC: 2.70L (pre),  FEV1: 1.77L, 57% predicted (pre),  FEV1/FVC ratio: 66 (pre),  Interpretation: Spirometry consistent with mixed obstructive and restrictive disease.  Please see scanned spirometry results for details.   Labs:  Lab Orders         COVID-19, Flu A+B and RSV         Pro b natriuretic peptide (BNP)[facility])         Basic Metabolic Panel (BMET)       Assessment and Plan  Assessment and Plan Assessment & Plan Acute dyspnea with cough and wheezing, possible pulmonary edema versus atypical infection Differential includes pulmonary edema and atypical infection. Chest x-ray showed increased interstitial markings. Recent treatment with prednisone, albuterol , and doxycycline with no significant improvement. Nebulizer provided some relief. Concerns about pertussis due to recent case increase, but azithromycin could interact with amiodarone . - Breathing test showed  - Provide sample trelegy start: 1 puff daily  - Order labs including BNP , viral panel, CBC with diff to differentiate between cardiac and pulmonary causes. - If BNP elevated, instruct ER visit for further evaluation and treatment. - If BNP low, consider additional steroids and antibiotics.  Heart failure with preserved ejection fraction (HFpEF) Concerns about exacerbation of heart failure given recent symptoms and cardiac  history. Prednisone can exacerbate heart failure. - Order BMP to assess for heart failure exacerbation. - If BMP elevated, instruct ER visit for diuresis and further management.  Coronary artery disease, status post  CABG PCI and stent Coronary artery disease with previous PCI and stent placement. Recent NSTEMI in February. Current symptoms could be related to cardiac issues, but differential includes pulmonary causes. - Monitor cardiac status in conjunction with pulmonary evaluation.  Paroxysmal atrial fibrillation on anticoagulation Paroxysmal atrial fibrillation on Eliquis . Potential interaction between azithromycin and amiodarone  affecting heart rhythm. - Avoid azithromycin unless absolutely necessary due to potential interaction  with amiodarone .  We will call you with results and next steps   This note in its entirety was forwarded to the Provider who requested this consultation.  Other: samples provided of: trelegy , reviewed spirometry technique, and reviewed inhaler technique  Thank you for your kind referral. I appreciate the opportunity to take part in Selso's care. Please do not hesitate to contact me with questions.  Sincerely,  Thank you so much for letting me partake in your care today.  Don't hesitate to reach out if you have any additional concerns!  Hargis Springer, MD  [facility] [place] [place]   Update: 12/12/2023.  Patient finally got labs done.  proBNP returnedAt 638, last known was on 2 9/25 at 701 during admission for NSTEMI.  Do not have a baseline.  BMP was normal.  Contacted patient, he reports improved dyspnea with Trelegy.  At this point I do think there is a potential for underlying heart failure.  We were not able to get a viral swab at [facility].  Perhaps he had a viral trigger that is responding to the Trelegy but I am concerned with the pulmonary edema that was on the chest x-ray.  As well as lack of previous asthma history.  Patient to  contact his cardiologist today.  Will hold off on further antibiotics and steroids as he seems to be improving with Trelegy.  Defer any other management for dyspnea that may be related to cardiac causes to cardiology.  Patient voiced understanding and will follow-up with me in 4 weeks to address other chronic underlying allergic conditions we could not address during his initial appointment due to his acute presentation.    "

## 2023-12-09 NOTE — Patient Instructions (Addendum)
 Acute dyspnea with cough and wheezing, possible pulmonary edema versus atypical infection Differential includes pulmonary edema and atypical infection. Chest x-ray showed increased interstitial markings. Recent treatment with prednisone, albuterol , and doxycycline with no significant improvement. Nebulizer provided some relief. Concerns about pertussis due to recent case increase, but azithromycin could interact with amiodarone . - Breathing test showed showed inflammation, but still unclear if cardiac or pulmonary source  - Provide sample trelegy start: 1 puff daily  - Order labs including BNP , viral panel, BMP, CBC with diff to differentiate between cardiac and pulmonary causes. - If BNP elevated, instruct ER visit for further evaluation and treatment. - If BNP low, consider additional steroids and antibiotics.  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 Prednisone can exacerbate heart failure. - Order BNP to assess for heart failure exacerbation. - If BNP elevated, instruct ER visit for diuresis and further management.  Coronary artery disease, status post  CABG PCI and stent Coronary artery disease with previous PCI and stent placement. Recent NSTEMI in February. Current symptoms could be related to cardiac issues, but differential includes pulmonary causes. - Monitor cardiac status in conjunction with pulmonary evaluation.  Paroxysmal atrial fibrillation on anticoagulation Paroxysmal atrial fibrillation on Eliquis . Potential interaction between azithromycin and amiodarone  affecting heart rhythm. - Avoid azithromycin unless absolutely necessary due to potential QT prolongation with amiodarone .  We will call you with results and next steps

## 2023-12-10 ENCOUNTER — Telehealth: Payer: Self-pay

## 2023-12-10 NOTE — Telephone Encounter (Signed)
 Patient called to request that he did want the COVID lab included in the lab work.

## 2023-12-11 ENCOUNTER — Other Ambulatory Visit: Payer: Self-pay | Admitting: Internal Medicine

## 2023-12-11 NOTE — Telephone Encounter (Signed)
 I mean to say that the patient didn't want the COVID lab done

## 2023-12-11 NOTE — Telephone Encounter (Signed)
 [facility] called they can not swab for covid or flu can we fax lab order to them at [phone]. Pt seen Monday by Dr Lorin patient is there. Provider informed. Order printed and ref-axed with covid order crossed out.

## 2023-12-12 LAB — BASIC METABOLIC PANEL WITH GFR
BUN/Creatinine Ratio: 14 (ref 10–24)
BUN: 12 mg/dL (ref 8–27)
CO2: 20 mmol/L (ref 20–29)
Calcium: 8.8 mg/dL (ref 8.6–10.2)
Chloride: 103 mmol/L (ref 96–106)
Creatinine, Ser: 0.88 mg/dL (ref 0.76–1.27)
Glucose: 91 mg/dL (ref 70–99)
Potassium: 4.9 mmol/L (ref 3.5–5.2)
Sodium: 138 mmol/L (ref 134–144)
eGFR: 87 mL/min/1.73 (ref >59)

## 2023-12-12 LAB — PRO B NATRIURETIC PEPTIDE: NT-Pro BNP: 638 pg/mL — ABNORMAL HIGH (ref 0–486)

## 2023-12-13 ENCOUNTER — Encounter: Payer: Self-pay | Admitting: Cardiovascular Disease

## 2023-12-13 ENCOUNTER — Ambulatory Visit: Admitting: Cardiovascular Disease

## 2023-12-13 VITALS — BP 114/56 | HR 88 | Ht 72.0 in | Wt 215.8 lb

## 2023-12-13 DIAGNOSIS — I5033 Acute on chronic diastolic (congestive) heart failure: Secondary | ICD-10-CM | POA: Diagnosis not present

## 2023-12-13 DIAGNOSIS — E782 Mixed hyperlipidemia: Secondary | ICD-10-CM

## 2023-12-13 DIAGNOSIS — Z9861 Coronary angioplasty status: Secondary | ICD-10-CM

## 2023-12-13 DIAGNOSIS — R0602 Shortness of breath: Secondary | ICD-10-CM

## 2023-12-13 DIAGNOSIS — Z0130 Encounter for examination of blood pressure without abnormal findings: Secondary | ICD-10-CM

## 2023-12-13 DIAGNOSIS — I4891 Unspecified atrial fibrillation: Secondary | ICD-10-CM | POA: Diagnosis not present

## 2023-12-13 DIAGNOSIS — I214 Non-ST elevation (NSTEMI) myocardial infarction: Secondary | ICD-10-CM | POA: Diagnosis not present

## 2023-12-13 MED ORDER — TORSEMIDE 10 MG PO TABS: 10.0000 mg | ORAL_TABLET | Freq: Two times a day (BID) | ORAL | 11 refills | Status: DC

## 2023-12-13 MED ORDER — DAPAGLIFLOZIN PROPANEDIOL 10 MG PO TABS: 10.0000 mg | ORAL_TABLET | Freq: Every day | ORAL | 3 refills | Status: DC

## 2023-12-13 NOTE — Progress Notes (Signed)
 Cardiology Office Note   Date:  12/13/2023   ID:  Devin Lerner Keilyn Nadal., DOB Dec 04, 1945, MRN [id number]  PCP:  Eliverto Bette Hover, MD  Cardiologist:  Denyse Bathe, MD      History of Present Illness: Devin [name]. is a 78 y.o. male who presents for  Chief Complaint  Patient presents with   Acute Visit    SOB, straining doing anything, states fluid around his heart    Went to ER with cough as had allergies, and then say allergist and had inhalers. Was told had blood markers were elevated and advised to see me.      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   Hypertension    Ischemic cardiomyopathy    MI, old    Peripheral neuropathy    PFO (patent foramen ovale) 05/2019   s/p repair   Sleep apnea    Spinal stenosis of lumbar region    TIA (transient ischemic attack)      Past Surgical History:  Procedure Laterality Date   APPENDECTOMY     BREAST SURGERY Right    benign mass   CARDIOVERSION Right 12/15/2019   Procedure: CARDIOVERSION;  Surgeon: Bathe Denyse LABOR, MD;  Location: [facility];  Service: Cardiovascular;  Laterality: Right;   CATARACT EXTRACTION, BILATERAL     COLONOSCOPY     CORONARY ANGIOPLASTY WITH STENT PLACEMENT     CORONARY ARTERY BYPASS GRAFT  05/2019   LIMA-LAD   CORONARY STENT INTERVENTION N/A 10/15/2022   Procedure: CORONARY STENT INTERVENTION;  Surgeon: Ammon Blunt, MD;  Location: [facility];  Service: Cardiovascular;  Laterality: N/A;   LEFT HEART CATH N/A 10/15/2022   Procedure: Left Heart Cath;  Surgeon: Ammon Blunt, MD;  Location: [facility];   Service: Cardiovascular;  Laterality: N/A;   LEFT HEART CATH AND CORS/GRAFTS ANGIOGRAPHY N/A 05/21/2019   Procedure: LEFT HEART CATH AND CORONARY ANGIOGRAPHY;  Surgeon: Bathe Denyse LABOR, MD;  Location: [facility];  Service: Cardiovascular;  Laterality: N/A;   LUMBAR LAMINECTOMY/DECOMPRESSION MICRODISCECTOMY N/A 07/04/2020   Procedure: L4-5 LAMINECTOMY;  Surgeon: Bluford Standing, MD;  Location: [facility];  Service: Neurosurgery;  Laterality: N/A;   REPLACEMENT TOTAL KNEE BILATERAL       Current Outpatient Medications  Medication Sig Dispense Refill   amiodarone  (PACERONE ) 200 MG tablet Take 1 tablet (200 mg total) by mouth daily. 30 tablet 2   atorvastatin  (LIPITOR ) 80 MG tablet Take 80 mg by mouth daily.     buPROPion  (WELLBUTRIN  XL) 300 MG 24 hr tablet Take 300 mg by mouth daily.     dapagliflozin propanediol (FARXIGA) 10 MG TABS tablet Take 1 tablet (10 mg total) by mouth daily before breakfast. 30 tablet 3   ELIQUIS  5 MG TABS tablet TAKE 1 TABLET BY MOUTH TWICE A DAY 60 tablet 3   ENTRESTO  97-103 MG TAKE 1 TABLET BY MOUTH TWICE A DAY 180 tablet 1   EPINEPHrine  0.3 mg/0.3 mL IJ SOAJ injection Inject 0.3 mg into the muscle as needed for anaphylaxis.  ezetimibe  (ZETIA ) 10 MG tablet Take 10 mg by mouth daily.     fluticasone  (FLONASE ) 50 MCG/ACT nasal spray SPRAY 2 SPRAYS INTO EACH NOSTRIL EVERY DAY     gabapentin  (NEURONTIN ) 300 MG capsule Take 400 mg by mouth in the morning, at noon, and at bedtime.     isosorbide  mononitrate (IMDUR ) 30 MG 24 hr tablet Take 1 tablet (30 mg total) by mouth daily. 30 tablet 1   metFORMIN  (GLUCOPHAGE ) 500 MG tablet Take 1,000 mg by mouth 2 (two) times daily.     metoprolol  succinate (TOPROL -XL) 25 MG 24 hr tablet TAKE 1 TAB BY MOUTH ONCE DAILY 90 tablet 2   nitroGLYCERIN  (NITROSTAT ) 0.4 MG SL tablet Place 0.4 mg under the tongue every 5 (five) minutes as needed for chest pain.     torsemide (DEMADEX) 10 MG tablet Take 1 tablet (10 mg total) by mouth 2  (two) times daily. 60 tablet 11   albuterol  (VENTOLIN  HFA) 108 (90 Base) MCG/ACT inhaler Inhale 2 puffs into the lungs every 6 (six) hours as needed for wheezing or shortness of breath. (Patient not taking: Reported on 12/13/2023)     Current Facility-Administered Medications  Medication Dose Route Frequency Provider Last Rate Last Admin   ipratropium-albuterol  (DUONEB) 0.5-2.5 (3) MG/3ML nebulizer solution 3 mL  3 mL Nebulization Once        Facility-Administered Medications Ordered in Other Visits  Medication Dose Route Frequency Provider Last Rate Last Admin   technetium sestamibi generic (CARDIOLITE ) injection 30 millicurie  30 millicurie Intravenous Once PRN Fernand Alter A, MD        Allergies:   Morphine , Yellow jacket venom [bee venom], and Trazodone     Social History:   reports that he has quit smoking. He has never used smokeless tobacco. He reports that he does not currently use alcohol. He reports that he does not use drugs.   Family History:  family history includes Allergic rhinitis in his brother; Osteoarthritis in his mother; Other (age of onset: 33) in his father.    ROS:     Review of Systems  Constitutional: Negative.   HENT: Negative.    Eyes: Negative.   Respiratory: Negative.    Gastrointestinal: Negative.   Genitourinary: Negative.   Musculoskeletal: Negative.   Skin: Negative.   Neurological: Negative.   Endo/Heme/Allergies: Negative.   Psychiatric/Behavioral: Negative.    All other systems reviewed and are negative.     All other systems are reviewed and negative.    PHYSICAL EXAM: VS:  BP (!) 114/56   Pulse 88   Ht 6' (1.829 m)   Wt 215 lb 12.8 oz (97.9 kg)   SpO2 93%   BMI 29.27 kg/m  , BMI Body mass index is 29.27 kg/m. Last weight:  Wt Readings from Last 3 Encounters:  12/13/23 215 lb 12.8 oz (97.9 kg)  12/09/23 217 lb 11.2 oz (98.7 kg)  11/26/23 218 lb 9.6 oz (99.2 kg)     Physical Exam Vitals reviewed.  Constitutional:       Appearance: Normal appearance. He is normal weight.  HENT:     Head: Normocephalic.     Nose: Nose normal.     Mouth/Throat:     Mouth: Mucous membranes are moist.  Eyes:     Pupils: Pupils are equal, round, and reactive to light.  Cardiovascular:     Rate and Rhythm: Normal rate and regular rhythm.     Pulses: Normal pulses.     Heart sounds:  Normal heart sounds.  Pulmonary:     Effort: Pulmonary effort is normal.  Abdominal:     General: Abdomen is flat. Bowel sounds are normal.  Musculoskeletal:        General: Normal range of motion.     Cervical back: Normal range of motion.  Skin:    General: Skin is warm.  Neurological:     General: No focal deficit present.     Mental Status: He is alert.  Psychiatric:        Mood and Affect: Mood normal.       EKG:   Recent Labs: 12/11/2023: BUN 12; Creatinine, Ser 0.88; NT-Pro BNP 638; Potassium 4.9; Sodium 138    Lipid Panel    Component Value Date/Time   CHOL 105 10/13/2022 0313   TRIG 55 10/13/2022 0313   HDL 59 10/13/2022 0313   CHOLHDL 1.8 10/13/2022 0313   VLDL 11 10/13/2022 0313   LDLCALC 35 10/13/2022 0313      Other studies Reviewed: Additional studies/ records that were reviewed today include:  Review of the above records demonstrates:       No data to display            ASSESSMENT AND PLAN:    ICD-10-CM   1. CHF (congestive heart failure), NYHA class III, acute on chronic, diastolic (HCC)  I50.33 torsemide (DEMADEX) 10 MG tablet    dapagliflozin propanediol (FARXIGA) 10 MG TABS tablet   CXR last weeek showed interstial lung changes with BNP over 638. add toresemide and farxiga. Has HErEF, but normal EF now, and stress test fixed inferior defect    2. Atrial fibrillation status post cardioversion (HCC)  I48.91 torsemide (DEMADEX) 10 MG tablet    dapagliflozin propanediol (FARXIGA) 10 MG TABS tablet    3. NSTEMI (non-ST elevated myocardial infarction) (HCC)  I21.4 torsemide (DEMADEX) 10 MG  tablet    dapagliflozin propanediol (FARXIGA) 10 MG TABS tablet    4. Mixed hyperlipidemia  E78.2 torsemide (DEMADEX) 10 MG tablet    dapagliflozin propanediol (FARXIGA) 10 MG TABS tablet    5. History of PTCA 1  Z98.61 torsemide (DEMADEX) 10 MG tablet    dapagliflozin propanediol (FARXIGA) 10 MG TABS tablet    6. SOB (shortness of breath)  R06.02 torsemide (DEMADEX) 10 MG tablet    dapagliflozin propanediol (FARXIGA) 10 MG TABS tablet       Problem List Items Addressed This Visit       Cardiovascular and Mediastinum   Atrial fibrillation status post cardioversion (HCC)   Relevant Medications   torsemide (DEMADEX) 10 MG tablet   dapagliflozin propanediol (FARXIGA) 10 MG TABS tablet   NSTEMI (non-ST elevated myocardial infarction) (HCC)   Relevant Medications   torsemide (DEMADEX) 10 MG tablet   dapagliflozin propanediol (FARXIGA) 10 MG TABS tablet     Other   History of PTCA 1   Relevant Medications   torsemide (DEMADEX) 10 MG tablet   dapagliflozin propanediol (FARXIGA) 10 MG TABS tablet   Hyperlipidemia   Relevant Medications   torsemide (DEMADEX) 10 MG tablet   dapagliflozin propanediol (FARXIGA) 10 MG TABS tablet   Other Visit Diagnoses       CHF (congestive heart failure), NYHA class III, acute on chronic, diastolic (HCC)    -  Primary   CXR last weeek showed interstial lung changes with BNP over 638. add toresemide and farxiga. Has HErEF, but normal EF now, and stress test fixed inferior defect   Relevant Medications  torsemide (DEMADEX) 10 MG tablet   dapagliflozin propanediol (FARXIGA) 10 MG TABS tablet     SOB (shortness of breath)       Relevant Medications   torsemide (DEMADEX) 10 MG tablet   dapagliflozin propanediol (FARXIGA) 10 MG TABS tablet          Disposition:   No follow-ups on file.    Total time spent: 50 minutes  Signed,  Denyse Bathe, MD  12/13/2023 3:40 PM    [facility]

## 2023-12-16 ENCOUNTER — Other Ambulatory Visit: Payer: Self-pay | Admitting: Cardiovascular Disease

## 2023-12-16 DIAGNOSIS — I2510 Atherosclerotic heart disease of native coronary artery without angina pectoris: Secondary | ICD-10-CM

## 2023-12-16 DIAGNOSIS — I5020 Unspecified systolic (congestive) heart failure: Secondary | ICD-10-CM

## 2023-12-16 DIAGNOSIS — I739 Peripheral vascular disease, unspecified: Secondary | ICD-10-CM

## 2023-12-16 DIAGNOSIS — I4891 Unspecified atrial fibrillation: Secondary | ICD-10-CM

## 2023-12-16 DIAGNOSIS — I25700 Atherosclerosis of coronary artery bypass graft(s), unspecified, with unstable angina pectoris: Secondary | ICD-10-CM

## 2023-12-19 ENCOUNTER — Ambulatory Visit

## 2023-12-20 ENCOUNTER — Ambulatory Visit: Admitting: Cardiovascular Disease

## 2023-12-20 ENCOUNTER — Encounter: Payer: Self-pay | Admitting: Cardiovascular Disease

## 2023-12-20 VITALS — BP 100/66 | HR 100 | Ht 72.0 in | Wt 209.6 lb

## 2023-12-20 DIAGNOSIS — I7143 Infrarenal abdominal aortic aneurysm, without rupture: Secondary | ICD-10-CM | POA: Diagnosis not present

## 2023-12-20 DIAGNOSIS — I739 Peripheral vascular disease, unspecified: Secondary | ICD-10-CM

## 2023-12-20 DIAGNOSIS — I214 Non-ST elevation (NSTEMI) myocardial infarction: Secondary | ICD-10-CM

## 2023-12-20 DIAGNOSIS — I255 Ischemic cardiomyopathy: Secondary | ICD-10-CM

## 2023-12-20 DIAGNOSIS — R0602 Shortness of breath: Secondary | ICD-10-CM | POA: Diagnosis not present

## 2023-12-20 DIAGNOSIS — E782 Mixed hyperlipidemia: Secondary | ICD-10-CM | POA: Diagnosis not present

## 2023-12-20 DIAGNOSIS — I25119 Atherosclerotic heart disease of native coronary artery with unspecified angina pectoris: Secondary | ICD-10-CM

## 2023-12-20 DIAGNOSIS — I5033 Acute on chronic diastolic (congestive) heart failure: Secondary | ICD-10-CM

## 2023-12-20 DIAGNOSIS — I5020 Unspecified systolic (congestive) heart failure: Secondary | ICD-10-CM

## 2023-12-20 DIAGNOSIS — Z0130 Encounter for examination of blood pressure without abnormal findings: Secondary | ICD-10-CM

## 2023-12-20 DIAGNOSIS — Z9861 Coronary angioplasty status: Secondary | ICD-10-CM

## 2023-12-20 DIAGNOSIS — I4891 Unspecified atrial fibrillation: Secondary | ICD-10-CM

## 2023-12-20 DIAGNOSIS — E861 Hypovolemia: Secondary | ICD-10-CM

## 2023-12-20 MED ORDER — SACUBITRIL-VALSARTAN 49-51 MG PO TABS: 1.0000 | ORAL_TABLET | Freq: Two times a day (BID) | ORAL | Status: DC

## 2023-12-20 MED ORDER — DAPAGLIFLOZIN PROPANEDIOL 10 MG PO TABS: 10.0000 mg | ORAL_TABLET | Freq: Every day | ORAL | 3 refills | Status: AC

## 2023-12-20 MED ORDER — TORSEMIDE 10 MG PO TABS: 10.0000 mg | ORAL_TABLET | Freq: Two times a day (BID) | ORAL | 11 refills | Status: AC

## 2023-12-20 NOTE — Progress Notes (Signed)
 Cardiology Office Note   Date:  12/20/2023   ID:  Devin [name]., DOB Apr 18, 1945, MRN [id number]  PCP:  Devin Bette Hover, MD  Cardiologist:  Devin Bathe, MD      History of Present Illness: Devin [name]. is a 78 y.o. male who presents for  Chief Complaint  Patient presents with   Follow-up    1 week follow up    Has SOB on exertion, says has no apatite.      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   Hypertension    Ischemic cardiomyopathy    MI, old    Peripheral neuropathy    PFO (patent foramen ovale) 05/2019   s/p repair   Sleep apnea    Spinal stenosis of lumbar region    TIA (transient ischemic attack)      Past Surgical History:  Procedure Laterality Date   APPENDECTOMY     BREAST SURGERY Right    benign mass   CARDIOVERSION Right 12/15/2019   Procedure: CARDIOVERSION;  Surgeon: [name] Devin LABOR, MD;  Location: [facility];  Service: Cardiovascular;  Laterality: Right;   CATARACT EXTRACTION, BILATERAL     COLONOSCOPY     CORONARY ANGIOPLASTY WITH STENT PLACEMENT     CORONARY ARTERY BYPASS GRAFT  05/2019   LIMA-LAD   CORONARY STENT INTERVENTION N/A 10/15/2022   Procedure: CORONARY STENT INTERVENTION;  Surgeon: Ammon Blunt, MD;  Location: [facility];  Service: Cardiovascular;  Laterality: N/A;   LEFT HEART CATH N/A 10/15/2022   Procedure: Left Heart Cath;  Surgeon: Ammon Blunt, MD;  Location: [facility];  Service: Cardiovascular;  Laterality: N/A;   LEFT HEART CATH AND CORS/GRAFTS ANGIOGRAPHY N/A 05/21/2019   Procedure: LEFT HEART CATH AND CORONARY  ANGIOGRAPHY;  Surgeon: [name] Devin LABOR, MD;  Location: [facility];  Service: Cardiovascular;  Laterality: N/A;   LUMBAR LAMINECTOMY/DECOMPRESSION MICRODISCECTOMY N/A 07/04/2020   Procedure: L4-5 LAMINECTOMY;  Surgeon: Bluford Standing, MD;  Location: [facility];  Service: Neurosurgery;  Laterality: N/A;   REPLACEMENT TOTAL KNEE BILATERAL       Current Outpatient Medications  Medication Sig Dispense Refill   amiodarone  (PACERONE ) 200 MG tablet Take 1 tablet (200 mg total) by mouth daily. 30 tablet 2   atorvastatin  (LIPITOR ) 80 MG tablet Take 80 mg by mouth daily.     buPROPion  (WELLBUTRIN  XL) 300 MG 24 hr tablet Take 300 mg by mouth daily.     ELIQUIS  5 MG TABS tablet TAKE 1 TABLET BY MOUTH TWICE A DAY 60 tablet 3   ENTRESTO  97-103 MG TAKE 1 TABLET BY MOUTH TWICE A DAY 180 tablet 1   EPINEPHrine  0.3 mg/0.3 mL IJ SOAJ injection Inject 0.3 mg into the muscle as needed for anaphylaxis.     ezetimibe  (ZETIA ) 10 MG tablet Take 10 mg by mouth daily.     fluticasone  (FLONASE ) 50 MCG/ACT nasal spray SPRAY 2 SPRAYS INTO EACH NOSTRIL EVERY DAY     gabapentin  (NEURONTIN ) 300 MG capsule Take 400 mg by mouth in  the morning, at noon, and at bedtime.     metFORMIN  (GLUCOPHAGE ) 500 MG tablet Take 1,000 mg by mouth 2 (two) times daily.     metoprolol  succinate (TOPROL -XL) 25 MG 24 hr tablet TAKE 1 TAB BY MOUTH ONCE DAILY 90 tablet 2   nitroGLYCERIN  (NITROSTAT ) 0.4 MG SL tablet Place 0.4 mg under the tongue every 5 (five) minutes as needed for chest pain.     pantoprazole  (PROTONIX ) 40 MG tablet TAKE 1 TABLET BY MOUTH EVERY DAY 30 tablet 1   albuterol  (VENTOLIN  HFA) 108 (90 Base) MCG/ACT inhaler Inhale 2 puffs into the lungs every 6 (six) hours as needed for wheezing or shortness of breath. (Patient not taking: Reported on 12/20/2023)     dapagliflozin propanediol (FARXIGA) 10 MG TABS tablet Take 1 tablet (10 mg total) by mouth daily before breakfast. 30 tablet 3   torsemide (DEMADEX) 10 MG tablet Take 1  tablet (10 mg total) by mouth 2 (two) times daily. 60 tablet 11   Current Facility-Administered Medications  Medication Dose Route Frequency Provider Last Rate Last Admin   ipratropium-albuterol  (DUONEB) 0.5-2.5 (3) MG/3ML nebulizer solution 3 mL  3 mL Nebulization Once        sacubitril -valsartan  (ENTRESTO ) 49-51 mg per tablet  1 tablet Oral BID        Facility-Administered Medications Ordered in Other Visits  Medication Dose Route Frequency Provider Last Rate Last Admin   technetium sestamibi generic (CARDIOLITE ) injection 30 millicurie  30 millicurie Intravenous Once PRN Devin [name] A, MD        Allergies:   Morphine , Yellow jacket venom [bee venom], and Trazodone     Social History:   reports that he has quit smoking. He has never used smokeless tobacco. He reports that he does not currently use alcohol. He reports that he does not use drugs.   Family History:  family history includes Allergic rhinitis in his brother; Osteoarthritis in his mother; Other (age of onset: 81) in his father.    ROS:     Review of Systems  Constitutional: Negative.   HENT: Negative.    Eyes: Negative.   Respiratory: Negative.    Gastrointestinal: Negative.   Genitourinary: Negative.   Musculoskeletal: Negative.   Skin: Negative.   Neurological: Negative.   Endo/Heme/Allergies: Negative.   Psychiatric/Behavioral: Negative.    All other systems reviewed and are negative.     All other systems are reviewed and negative.    PHYSICAL EXAM: VS:  BP 100/66   Pulse 100   Ht 6' (1.829 m)   Wt 209 lb 9.6 oz (95.1 kg)   SpO2 92%   BMI 28.43 kg/m  , BMI Body mass index is 28.43 kg/m. Last weight:  Wt Readings from Last 3 Encounters:  12/20/23 209 lb 9.6 oz (95.1 kg)  12/13/23 215 lb 12.8 oz (97.9 kg)  12/09/23 217 lb 11.2 oz (98.7 kg)     Physical Exam Vitals reviewed.  Constitutional:      Appearance: Normal appearance. He is normal weight.  HENT:     Head: Normocephalic.     Nose:  Nose normal.     Mouth/Throat:     Mouth: Mucous membranes are moist.  Eyes:     Pupils: Pupils are equal, round, and reactive to light.  Cardiovascular:     Rate and Rhythm: Normal rate and regular rhythm.     Pulses: Normal pulses.     Heart sounds: Normal heart sounds.  Pulmonary:  Effort: Pulmonary effort is normal.  Abdominal:     General: Abdomen is flat. Bowel sounds are normal.  Musculoskeletal:        General: Normal range of motion.     Cervical back: Normal range of motion.  Skin:    General: Skin is warm.  Neurological:     General: No focal deficit present.     Mental Status: He is alert.  Psychiatric:        Mood and Affect: Mood normal.       EKG:   Recent Labs: 12/11/2023: BUN 12; Creatinine, Ser 0.88; NT-Pro BNP 638; Potassium 4.9; Sodium 138    Lipid Panel    Component Value Date/Time   CHOL 105 10/13/2022 0313   TRIG 55 10/13/2022 0313   HDL 59 10/13/2022 0313   CHOLHDL 1.8 10/13/2022 0313   VLDL 11 10/13/2022 0313   LDLCALC 35 10/13/2022 0313      Other studies Reviewed: Additional studies/ records that were reviewed today include:  Review of the above records demonstrates:       No data to display            ASSESSMENT AND PLAN:    ICD-10-CM   1. Infrarenal abdominal aortic aneurysm (AAA) without rupture  I71.43 sacubitril -valsartan  (ENTRESTO ) 49-51 mg per tablet    Comprehensive metabolic panel    2. Ischemic cardiomyopathy  I25.5 sacubitril -valsartan  (ENTRESTO ) 49-51 mg per tablet    Comprehensive metabolic panel    3. PAD (peripheral artery disease)  I73.9 sacubitril -valsartan  (ENTRESTO ) 49-51 mg per tablet    Comprehensive metabolic panel    4. Coronary artery disease involving native coronary artery of native heart with angina pectoris  I25.119 sacubitril -valsartan  (ENTRESTO ) 49-51 mg per tablet    Comprehensive metabolic panel    5. HFrEF (heart failure with reduced ejection fraction) (HCC)  I50.20  sacubitril -valsartan  (ENTRESTO ) 49-51 mg per tablet    Comprehensive metabolic panel    6. Atrial fibrillation status post cardioversion (HCC)  I48.91 dapagliflozin propanediol (FARXIGA) 10 MG TABS tablet    torsemide (DEMADEX) 10 MG tablet    sacubitril -valsartan  (ENTRESTO ) 49-51 mg per tablet    Comprehensive metabolic panel    7. NSTEMI (non-ST elevated myocardial infarction) (HCC)  I21.4 dapagliflozin propanediol (FARXIGA) 10 MG TABS tablet    torsemide (DEMADEX) 10 MG tablet    sacubitril -valsartan  (ENTRESTO ) 49-51 mg per tablet    Comprehensive metabolic panel    8. Mixed hyperlipidemia  E78.2 dapagliflozin propanediol (FARXIGA) 10 MG TABS tablet    torsemide (DEMADEX) 10 MG tablet    sacubitril -valsartan  (ENTRESTO ) 49-51 mg per tablet    Comprehensive metabolic panel    9. History of PTCA 1  Z98.61 dapagliflozin propanediol (FARXIGA) 10 MG TABS tablet    torsemide (DEMADEX) 10 MG tablet    sacubitril -valsartan  (ENTRESTO ) 49-51 mg per tablet    Comprehensive metabolic panel    10. SOB (shortness of breath)  R06.02 dapagliflozin propanediol (FARXIGA) 10 MG TABS tablet    torsemide (DEMADEX) 10 MG tablet    sacubitril -valsartan  (ENTRESTO ) 49-51 mg per tablet    Comprehensive metabolic panel    11. CHF (congestive heart failure), NYHA class III, acute on chronic, diastolic (HCC)  I50.33 dapagliflozin propanediol (FARXIGA) 10 MG TABS tablet    torsemide (DEMADEX) 10 MG tablet    sacubitril -valsartan  (ENTRESTO ) 49-51 mg per tablet    Comprehensive metabolic panel   CXR last weeek showed interstial lung changes with BNP over 638. add toresemide and farxiga. Has  HErEF, but normal EF now, and stress test fixed inferior defect    12. Hypotension due to hypovolemia  E86.1    Feels weak, decrease entrest 97 to 49 mg bid. Stop isosrbide.       Problem List Items Addressed This Visit       Cardiovascular and Mediastinum   CAD (coronary artery disease)   Relevant Medications    torsemide (DEMADEX) 10 MG tablet   sacubitril -valsartan  (ENTRESTO ) 49-51 mg per tablet (Start on 12/20/2023  3:15 PM)   Other Relevant Orders   Comprehensive metabolic panel   AAA (abdominal aortic aneurysm) - Primary   Relevant Medications   torsemide (DEMADEX) 10 MG tablet   sacubitril -valsartan  (ENTRESTO ) 49-51 mg per tablet (Start on 12/20/2023  3:15 PM)   Other Relevant Orders   Comprehensive metabolic panel   Atrial fibrillation status post cardioversion (HCC)   Relevant Medications   dapagliflozin propanediol (FARXIGA) 10 MG TABS tablet   torsemide (DEMADEX) 10 MG tablet   sacubitril -valsartan  (ENTRESTO ) 49-51 mg per tablet (Start on 12/20/2023  3:15 PM)   Other Relevant Orders   Comprehensive metabolic panel   Ischemic cardiomyopathy   Relevant Medications   torsemide (DEMADEX) 10 MG tablet   sacubitril -valsartan  (ENTRESTO ) 49-51 mg per tablet (Start on 12/20/2023  3:15 PM)   Other Relevant Orders   Comprehensive metabolic panel   PAD (peripheral artery disease)   Relevant Medications   torsemide (DEMADEX) 10 MG tablet   sacubitril -valsartan  (ENTRESTO ) 49-51 mg per tablet (Start on 12/20/2023  3:15 PM)   Other Relevant Orders   Comprehensive metabolic panel   NSTEMI (non-ST elevated myocardial infarction) (HCC)   Relevant Medications   dapagliflozin propanediol (FARXIGA) 10 MG TABS tablet   torsemide (DEMADEX) 10 MG tablet   sacubitril -valsartan  (ENTRESTO ) 49-51 mg per tablet (Start on 12/20/2023  3:15 PM)   Other Relevant Orders   Comprehensive metabolic panel   HFrEF (heart failure with reduced ejection fraction) (HCC)   Relevant Medications   torsemide (DEMADEX) 10 MG tablet   sacubitril -valsartan  (ENTRESTO ) 49-51 mg per tablet (Start on 12/20/2023  3:15 PM)   Other Relevant Orders   Comprehensive metabolic panel     Other   History of PTCA 1   Relevant Medications   dapagliflozin propanediol (FARXIGA) 10 MG TABS tablet   torsemide (DEMADEX) 10 MG tablet    sacubitril -valsartan  (ENTRESTO ) 49-51 mg per tablet (Start on 12/20/2023  3:15 PM)   Other Relevant Orders   Comprehensive metabolic panel   Hyperlipidemia   Relevant Medications   dapagliflozin propanediol (FARXIGA) 10 MG TABS tablet   torsemide (DEMADEX) 10 MG tablet   sacubitril -valsartan  (ENTRESTO ) 49-51 mg per tablet (Start on 12/20/2023  3:15 PM)   Other Relevant Orders   Comprehensive metabolic panel   Other Visit Diagnoses       SOB (shortness of breath)       Relevant Medications   dapagliflozin propanediol (FARXIGA) 10 MG TABS tablet   torsemide (DEMADEX) 10 MG tablet   sacubitril -valsartan  (ENTRESTO ) 49-51 mg per tablet (Start on 12/20/2023  3:15 PM)   Other Relevant Orders   Comprehensive metabolic panel     CHF (congestive heart failure), NYHA class III, acute on chronic, diastolic (HCC)       CXR last weeek showed interstial lung changes with BNP over 638. add toresemide and farxiga. Has HErEF, but normal EF now, and stress test fixed inferior defect   Relevant Medications   dapagliflozin propanediol (FARXIGA) 10 MG TABS tablet  torsemide (DEMADEX) 10 MG tablet   sacubitril -valsartan  (ENTRESTO ) 49-51 mg per tablet (Start on 12/20/2023  3:15 PM)   Other Relevant Orders   Comprehensive metabolic panel     Hypotension due to hypovolemia       Feels weak, decrease entrest 97 to 49 mg bid. Stop isosrbide.   Relevant Medications   torsemide (DEMADEX) 10 MG tablet   sacubitril -valsartan  (ENTRESTO ) 49-51 mg per tablet (Start on 12/20/2023  3:15 PM)       Patient was suppose to go to [place], but is in CHF, thus advise against traveling.   Disposition:   Return in about 1 week (around 12/27/2023).    Total time spent: 40 minutes  Signed,  Devin Bathe, MD  12/20/2023 3:13 PM    [facility]

## 2023-12-21 LAB — COMPREHENSIVE METABOLIC PANEL WITH GFR
ALT: 28 IU/L (ref 0–44)
AST: 18 IU/L (ref 0–40)
Albumin: 3.7 g/dL — ABNORMAL LOW (ref 3.8–4.8)
Alkaline Phosphatase: 82 IU/L (ref 47–123)
BUN/Creatinine Ratio: 24 (ref 10–24)
BUN: 28 mg/dL — ABNORMAL HIGH (ref 8–27)
Bilirubin Total: 0.4 mg/dL (ref 0.0–1.2)
CO2: 23 mmol/L (ref 20–29)
Calcium: 9.4 mg/dL (ref 8.6–10.2)
Chloride: 97 mmol/L (ref 96–106)
Creatinine, Ser: 1.17 mg/dL (ref 0.76–1.27)
Globulin, Total: 3.2 g/dL (ref 1.5–4.5)
Glucose: 77 mg/dL (ref 70–99)
Potassium: 4.2 mmol/L (ref 3.5–5.2)
Sodium: 137 mmol/L (ref 134–144)
Total Protein: 6.9 g/dL (ref 6.0–8.5)
eGFR: 63 mL/min/1.73 (ref >59)

## 2023-12-23 ENCOUNTER — Telehealth: Payer: Self-pay

## 2023-12-23 NOTE — Telephone Encounter (Signed)
 Patient called stating that you put him on 41mg  entresto  but he was previously on 97-103mg ? Please advise what the patient is supposed to do.

## 2023-12-25 ENCOUNTER — Encounter: Payer: Self-pay | Admitting: Cardiovascular Disease

## 2023-12-27 ENCOUNTER — Other Ambulatory Visit: Payer: Self-pay

## 2023-12-27 MED ORDER — SACUBITRIL-VALSARTAN 49-51 MG PO TABS: 1.0000 | ORAL_TABLET | Freq: Two times a day (BID) | ORAL | 2 refills | Status: DC

## 2023-12-30 ENCOUNTER — Ambulatory Visit: Admitting: Cardiovascular Disease

## 2023-12-30 ENCOUNTER — Encounter: Payer: Self-pay | Admitting: Cardiovascular Disease

## 2023-12-30 VITALS — BP 102/58 | HR 59 | Ht 72.0 in | Wt 209.2 lb

## 2023-12-30 DIAGNOSIS — I10 Essential (primary) hypertension: Secondary | ICD-10-CM | POA: Diagnosis not present

## 2023-12-30 DIAGNOSIS — I25119 Atherosclerotic heart disease of native coronary artery with unspecified angina pectoris: Secondary | ICD-10-CM

## 2023-12-30 DIAGNOSIS — I4891 Unspecified atrial fibrillation: Secondary | ICD-10-CM

## 2023-12-30 DIAGNOSIS — I255 Ischemic cardiomyopathy: Secondary | ICD-10-CM

## 2023-12-30 DIAGNOSIS — I5042 Chronic combined systolic (congestive) and diastolic (congestive) heart failure: Secondary | ICD-10-CM

## 2023-12-30 DIAGNOSIS — I739 Peripheral vascular disease, unspecified: Secondary | ICD-10-CM

## 2023-12-30 MED ORDER — SACUBITRIL-VALSARTAN 49-51 MG PO TABS
1.0000 | ORAL_TABLET | Freq: Two times a day (BID) | ORAL | 2 refills | Status: AC
Start: 2023-12-30 — End: ?

## 2023-12-30 NOTE — Progress Notes (Signed)
 Cardiology Office Note   Date:  12/30/2023   ID:  Devin Ciszewski., DOB 06-Mar-1946, MRN [id number]  PCP:  Eliverto Bette Hover, MD  Cardiologist:  Denyse Bathe, MD      History of Present Illness: Devin Squier. is a 78 y.o. male who presents for  Chief Complaint  Patient presents with   Follow-up    1 week follow up    Feeling better, no SOB, feels lot stronger.      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   Hypertension    Ischemic cardiomyopathy    MI, old    Peripheral neuropathy    PFO (patent foramen ovale) 05/2019   s/p repair   Sleep apnea    Spinal stenosis of lumbar region    TIA (transient ischemic attack)      Past Surgical History:  Procedure Laterality Date   APPENDECTOMY     BREAST SURGERY Right    benign mass   CARDIOVERSION Right 12/15/2019   Procedure: CARDIOVERSION;  Surgeon: Bathe Denyse LABOR, MD;  Location: [facility];  Service: Cardiovascular;  Laterality: Right;   CATARACT EXTRACTION, BILATERAL     COLONOSCOPY     CORONARY ANGIOPLASTY WITH STENT PLACEMENT     CORONARY ARTERY BYPASS GRAFT  05/2019   LIMA-LAD   CORONARY STENT INTERVENTION N/A 10/15/2022   Procedure: CORONARY STENT INTERVENTION;  Surgeon: Ammon Blunt, MD;  Location: [facility];  Service: Cardiovascular;  Laterality: N/A;   LEFT HEART CATH N/A 10/15/2022   Procedure: Left Heart Cath;  Surgeon: Ammon Blunt, MD;  Location: [facility];  Service: Cardiovascular;  Laterality: N/A;   LEFT HEART CATH AND CORS/GRAFTS ANGIOGRAPHY N/A 05/21/2019   Procedure: LEFT HEART CATH AND CORONARY  ANGIOGRAPHY;  Surgeon: Bathe Denyse LABOR, MD;  Location: [facility];  Service: Cardiovascular;  Laterality: N/A;   LUMBAR LAMINECTOMY/DECOMPRESSION MICRODISCECTOMY N/A 07/04/2020   Procedure: L4-5 LAMINECTOMY;  Surgeon: Bluford Standing, MD;  Location: [facility];  Service: Neurosurgery;  Laterality: N/A;   REPLACEMENT TOTAL KNEE BILATERAL       Current Outpatient Medications  Medication Sig Dispense Refill   albuterol  (VENTOLIN  HFA) 108 (90 Base) MCG/ACT inhaler Inhale 2 puffs into the lungs every 6 (six) hours as needed for wheezing or shortness of breath. (Patient not taking: Reported on 12/20/2023)     amiodarone  (PACERONE ) 200 MG tablet Take 1 tablet (200 mg total) by mouth daily. 30 tablet 2   atorvastatin  (LIPITOR ) 80 MG tablet Take 80 mg by mouth daily.     buPROPion  (WELLBUTRIN  XL) 300 MG 24 hr tablet Take 300 mg by mouth daily.     dapagliflozin propanediol (FARXIGA) 10 MG TABS tablet Take 1 tablet (10 mg total) by mouth daily before breakfast. 30 tablet 3   ELIQUIS  5 MG TABS tablet TAKE 1 TABLET BY MOUTH TWICE A DAY 60 tablet 3   EPINEPHrine  0.3 mg/0.3 mL IJ SOAJ injection Inject 0.3 mg into the muscle as needed for anaphylaxis.     ezetimibe  (ZETIA )  10 MG tablet Take 10 mg by mouth daily.     fluticasone  (FLONASE ) 50 MCG/ACT nasal spray SPRAY 2 SPRAYS INTO EACH NOSTRIL EVERY DAY     gabapentin  (NEURONTIN ) 300 MG capsule Take 400 mg by mouth in the morning, at noon, and at bedtime.     metFORMIN  (GLUCOPHAGE ) 500 MG tablet Take 1,000 mg by mouth 2 (two) times daily.     metoprolol  succinate (TOPROL -XL) 25 MG 24 hr tablet TAKE 1 TAB BY MOUTH ONCE DAILY 90 tablet 2   nitroGLYCERIN  (NITROSTAT ) 0.4 MG SL tablet Place 0.4 mg under the tongue every 5 (five) minutes as needed for chest pain.     pantoprazole  (PROTONIX ) 40 MG tablet TAKE 1 TABLET BY MOUTH EVERY DAY 30 tablet 1   sacubitril -valsartan  (ENTRESTO ) 49-51 MG Take 1 tablet by mouth 2 (two) times daily. 60 tablet 2   torsemide  (DEMADEX) 10 MG tablet Take 1 tablet (10 mg total) by mouth 2 (two) times daily. 60 tablet 11   Current Facility-Administered Medications  Medication Dose Route Frequency Provider Last Rate Last Admin   ipratropium-albuterol  (DUONEB) 0.5-2.5 (3) MG/3ML nebulizer solution 3 mL  3 mL Nebulization Once        Facility-Administered Medications Ordered in Other Visits  Medication Dose Route Frequency Provider Last Rate Last Admin   technetium sestamibi generic (CARDIOLITE ) injection 30 millicurie  30 millicurie Intravenous Once PRN Fernand Alter A, MD        Allergies:   Morphine , Yellow jacket venom [bee venom], and Trazodone     Social History:   reports that he has quit smoking. He has never used smokeless tobacco. He reports that he does not currently use alcohol. He reports that he does not use drugs.   Family History:  family history includes Allergic rhinitis in his brother; Osteoarthritis in his mother; Other (age of onset: 76) in his father.    ROS:     Review of Systems  Constitutional: Negative.   HENT: Negative.    Eyes: Negative.   Respiratory: Negative.    Gastrointestinal: Negative.   Genitourinary: Negative.   Musculoskeletal: Negative.   Skin: Negative.   Neurological: Negative.   Endo/Heme/Allergies: Negative.   Psychiatric/Behavioral: Negative.    All other systems reviewed and are negative.     All other systems are reviewed and negative.    PHYSICAL EXAM: VS:  BP (!) 102/58   Pulse (!) 59   Ht 6' (1.829 m)   Wt 209 lb 3.2 oz (94.9 kg)   SpO2 93%   BMI 28.37 kg/m  , BMI Body mass index is 28.37 kg/m. Last weight:  Wt Readings from Last 3 Encounters:  12/30/23 209 lb 3.2 oz (94.9 kg)  12/20/23 209 lb 9.6 oz (95.1 kg)  12/13/23 215 lb 12.8 oz (97.9 kg)     Physical Exam Vitals reviewed.  Constitutional:      Appearance: Normal appearance. He is normal weight.  HENT:     Head: Normocephalic.     Nose: Nose normal.     Mouth/Throat:     Mouth:  Mucous membranes are moist.  Eyes:     Pupils: Pupils are equal, round, and reactive to light.  Cardiovascular:     Rate and Rhythm: Normal rate and regular rhythm.     Pulses: Normal pulses.     Heart sounds: Normal heart sounds.  Pulmonary:     Effort: Pulmonary effort is normal.  Abdominal:     General: Abdomen is flat.  Bowel sounds are normal.  Musculoskeletal:        General: Normal range of motion.     Cervical back: Normal range of motion.  Skin:    General: Skin is warm.  Neurological:     General: No focal deficit present.     Mental Status: He is alert.  Psychiatric:        Mood and Affect: Mood normal.       EKG:   Recent Labs: 12/11/2023: NT-Pro BNP 638 12/20/2023: ALT 28; BUN 28; Creatinine, Ser 1.17; Potassium 4.2; Sodium 137    Lipid Panel    Component Value Date/Time   CHOL 105 10/13/2022 0313   TRIG 55 10/13/2022 0313   HDL 59 10/13/2022 0313   CHOLHDL 1.8 10/13/2022 0313   VLDL 11 10/13/2022 0313   LDLCALC 35 10/13/2022 0313      Other studies Reviewed: Additional studies/ records that were reviewed today include:  Review of the above records demonstrates:       No data to display            ASSESSMENT AND PLAN:    ICD-10-CM   1. Chronic combined systolic and diastolic congestive heart failure (HCC)  I50.42 sacubitril -valsartan  (ENTRESTO ) 49-51 MG    2. Coronary artery disease involving native coronary artery of native heart with angina pectoris  I25.119 sacubitril -valsartan  (ENTRESTO ) 49-51 MG    3. Atrial fibrillation status post cardioversion (HCC)  I48.91 sacubitril -valsartan  (ENTRESTO ) 49-51 MG    4. Ischemic cardiomyopathy  I25.5 sacubitril -valsartan  (ENTRESTO ) 49-51 MG    5. Primary hypertension  I10 sacubitril -valsartan  (ENTRESTO ) 49-51 MG    6. PAD (peripheral artery disease)  I73.9 sacubitril -valsartan  (ENTRESTO ) 49-51 MG       Problem List Items Addressed This Visit       Cardiovascular and Mediastinum   CAD  (coronary artery disease)   Relevant Medications   sacubitril -valsartan  (ENTRESTO ) 49-51 MG   HTN (hypertension)   Relevant Medications   sacubitril -valsartan  (ENTRESTO ) 49-51 MG   Congestive heart failure (CHF) (HCC) - Primary   Relevant Medications   sacubitril -valsartan  (ENTRESTO ) 49-51 MG   Atrial fibrillation status post cardioversion (HCC)   Relevant Medications   sacubitril -valsartan  (ENTRESTO ) 49-51 MG   Ischemic cardiomyopathy   Relevant Medications   sacubitril -valsartan  (ENTRESTO ) 49-51 MG   PAD (peripheral artery disease)   Relevant Medications   sacubitril -valsartan  (ENTRESTO ) 49-51 MG    NO NEED TO GO TO [place], AS NEEDS STAY FOR DUE TO HEALTH REASONS.   Disposition:   Return in about 4 weeks (around 01/27/2024) for PRINT MY OFFICE NOTE WHICH AT END SAYS NOT TO GO TO [place], .    Total time spent: 50 minutes  Signed,  Denyse Bathe, MD  12/30/2023 2:58 PM    [facility]

## 2024-01-11 ENCOUNTER — Other Ambulatory Visit: Payer: Self-pay | Admitting: Cardiovascular Disease

## 2024-01-11 DIAGNOSIS — R0789 Other chest pain: Secondary | ICD-10-CM

## 2024-01-11 DIAGNOSIS — I5020 Unspecified systolic (congestive) heart failure: Secondary | ICD-10-CM

## 2024-01-11 DIAGNOSIS — I25700 Atherosclerosis of coronary artery bypass graft(s), unspecified, with unstable angina pectoris: Secondary | ICD-10-CM

## 2024-01-11 DIAGNOSIS — R0602 Shortness of breath: Secondary | ICD-10-CM

## 2024-01-11 DIAGNOSIS — I4891 Unspecified atrial fibrillation: Secondary | ICD-10-CM

## 2024-01-11 DIAGNOSIS — I2510 Atherosclerotic heart disease of native coronary artery without angina pectoris: Secondary | ICD-10-CM

## 2024-01-23 ENCOUNTER — Other Ambulatory Visit: Payer: Self-pay | Admitting: Cardiovascular Disease

## 2024-01-23 DIAGNOSIS — I2510 Atherosclerotic heart disease of native coronary artery without angina pectoris: Secondary | ICD-10-CM

## 2024-01-23 DIAGNOSIS — R0789 Other chest pain: Secondary | ICD-10-CM

## 2024-01-23 DIAGNOSIS — R0602 Shortness of breath: Secondary | ICD-10-CM

## 2024-01-27 ENCOUNTER — Other Ambulatory Visit: Payer: Self-pay | Admitting: Cardiovascular Disease

## 2024-01-27 ENCOUNTER — Ambulatory Visit: Admitting: Cardiovascular Disease

## 2024-01-27 DIAGNOSIS — I739 Peripheral vascular disease, unspecified: Secondary | ICD-10-CM

## 2024-01-27 DIAGNOSIS — I25700 Atherosclerosis of coronary artery bypass graft(s), unspecified, with unstable angina pectoris: Secondary | ICD-10-CM

## 2024-01-27 DIAGNOSIS — I4891 Unspecified atrial fibrillation: Secondary | ICD-10-CM

## 2024-01-27 DIAGNOSIS — I2510 Atherosclerotic heart disease of native coronary artery without angina pectoris: Secondary | ICD-10-CM

## 2024-01-27 DIAGNOSIS — R0789 Other chest pain: Secondary | ICD-10-CM

## 2024-01-27 DIAGNOSIS — I5020 Unspecified systolic (congestive) heart failure: Secondary | ICD-10-CM

## 2024-01-27 DIAGNOSIS — R0602 Shortness of breath: Secondary | ICD-10-CM

## 2024-01-28 ENCOUNTER — Encounter: Payer: Self-pay | Admitting: Cardiovascular Disease

## 2024-01-28 ENCOUNTER — Ambulatory Visit: Admitting: Cardiovascular Disease

## 2024-01-28 VITALS — BP 104/58 | HR 69 | Ht 72.0 in | Wt 203.2 lb

## 2024-01-28 DIAGNOSIS — R0789 Other chest pain: Secondary | ICD-10-CM | POA: Diagnosis not present

## 2024-01-28 DIAGNOSIS — Z0130 Encounter for examination of blood pressure without abnormal findings: Secondary | ICD-10-CM

## 2024-01-28 DIAGNOSIS — I2510 Atherosclerotic heart disease of native coronary artery without angina pectoris: Secondary | ICD-10-CM | POA: Diagnosis not present

## 2024-01-28 DIAGNOSIS — I5020 Unspecified systolic (congestive) heart failure: Secondary | ICD-10-CM | POA: Diagnosis not present

## 2024-01-28 DIAGNOSIS — I4891 Unspecified atrial fibrillation: Secondary | ICD-10-CM

## 2024-01-28 DIAGNOSIS — R0602 Shortness of breath: Secondary | ICD-10-CM

## 2024-01-28 DIAGNOSIS — I25700 Atherosclerosis of coronary artery bypass graft(s), unspecified, with unstable angina pectoris: Secondary | ICD-10-CM

## 2024-01-28 DIAGNOSIS — I739 Peripheral vascular disease, unspecified: Secondary | ICD-10-CM

## 2024-01-28 NOTE — Progress Notes (Signed)
 Cardiology Office Note   Date:  01/28/2024   ID:  Devin Lerner Braeden Kennan., DOB Nov 05, 1945, MRN [id number]  PCP:  Devin Bette Hover, [name]  Cardiologist:  Devin Bathe, [name]      History of Present Illness: Devin Hodgman. is [name] 78 y.o. male who presents for  Chief Complaint  Patient presents with   Follow-up    4 week follow up    Doing good.      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   Hypertension    Ischemic cardiomyopathy    MI, old    Peripheral neuropathy    PFO (patent foramen ovale) 05/2019   s/p repair   Sleep apnea    Spinal stenosis of lumbar region    TIA (transient ischemic attack)      Past Surgical History:  Procedure Laterality Date   APPENDECTOMY     BREAST SURGERY Right    benign mass   CARDIOVERSION Right 12/15/2019   Procedure: CARDIOVERSION;  Surgeon: Devin [name];  Location: [facility];  Service: Cardiovascular;  Laterality: Right;   CATARACT EXTRACTION, BILATERAL     COLONOSCOPY     CORONARY ANGIOPLASTY WITH STENT PLACEMENT     CORONARY ARTERY BYPASS GRAFT  05/2019   LIMA-LAD   CORONARY STENT INTERVENTION N/[name] 10/15/2022   Procedure: CORONARY STENT INTERVENTION;  Surgeon: Devin Blunt, [name];  Location: [facility];  Service: Cardiovascular;  Laterality: N/[name];   LEFT HEART CATH N/[name] 10/15/2022   Procedure: Left Heart Cath;  Surgeon: Devin Blunt, [name];  Location: [facility];  Service: Cardiovascular;  Laterality: N/[name];   LEFT HEART CATH AND CORS/GRAFTS ANGIOGRAPHY N/[name] 05/21/2019   Procedure: LEFT HEART CATH AND CORONARY ANGIOGRAPHY;  Surgeon: Devin [name];  Location: [facility];  Service: Cardiovascular;  Laterality: N/[name];   LUMBAR LAMINECTOMY/DECOMPRESSION MICRODISCECTOMY N/[name] 07/04/2020   Procedure: L4-5 LAMINECTOMY;  Surgeon: Devin [name];  Location: [facility];  Service: Neurosurgery;  Laterality: N/[name];   REPLACEMENT TOTAL KNEE BILATERAL       Current Outpatient Medications  Medication Sig Dispense Refill   albuterol  (VENTOLIN  HFA) 108 (90 Base) MCG/ACT inhaler Inhale 2 puffs into the lungs every 6 (six) hours as needed for wheezing or shortness of breath.     amiodarone  (PACERONE ) 200 MG tablet TAKE 1 TABLET BY MOUTH EVERY DAY 90 tablet 2   atorvastatin  (LIPITOR ) 80 MG tablet Take 80 mg by mouth daily.     buPROPion  (WELLBUTRIN  XL) 300 MG 24 hr tablet Take 300 mg by mouth daily.     dapagliflozin  propanediol (FARXIGA ) 10 MG TABS tablet Take 1 tablet (10 mg total) by mouth daily before breakfast. 30 tablet 3   ELIQUIS  5 MG TABS tablet TAKE 1 TABLET BY MOUTH TWICE [name] DAY 60 tablet 3   EPINEPHrine  0.3 mg/0.3 mL IJ SOAJ injection Inject 0.3 mg into the muscle as needed for anaphylaxis.     ezetimibe  (ZETIA ) 10 MG tablet Take 10 mg by mouth daily.  fluticasone  (FLONASE ) 50 MCG/ACT nasal spray SPRAY 2 SPRAYS INTO EACH NOSTRIL EVERY DAY     gabapentin  (NEURONTIN ) 300 MG capsule Take 400 mg by mouth in the morning, at noon, and at bedtime.     isosorbide  mononitrate (IMDUR ) 30 MG 24 hr tablet TAKE 1 TABLET BY MOUTH EVERY DAY 30 tablet 1   metFORMIN  (GLUCOPHAGE ) 500 MG tablet Take 1,000 mg by mouth 2 (two) times daily.     metoprolol  succinate (TOPROL -XL) 25 MG 24 hr tablet TAKE 1 TAB BY MOUTH ONCE DAILY 90 tablet 2   nitroGLYCERIN  (NITROSTAT ) 0.4 MG SL tablet Place 0.4 mg under the tongue every 5 (five) minutes as needed for chest pain.     pantoprazole  (PROTONIX ) 40 MG tablet TAKE 1 TABLET BY MOUTH EVERY DAY 30 tablet 1   sacubitril -valsartan  (ENTRESTO ) 49-51 MG Take 1 tablet by mouth 2 (two) times daily. 60 tablet 2    torsemide (DEMADEX) 10 MG tablet Take 1 tablet (10 mg total) by mouth 2 (two) times daily. 60 tablet 11   ranolazine  (RANEXA ) 500 MG 12 hr tablet TAKE 1 TABLET BY MOUTH TWICE [name] DAY (Patient not taking: Reported on 01/28/2024) 180 tablet 1   Current Facility-Administered Medications  Medication Dose Route Frequency Provider Last Rate Last Admin   ipratropium-albuterol  (DUONEB) 0.5-2.5 (3) MG/3ML nebulizer solution 3 mL  3 mL Nebulization Once        Facility-Administered Medications Ordered in Other Visits  Medication Dose Route Frequency Provider Last Rate Last Admin   technetium sestamibi generic (CARDIOLITE ) injection 30 millicurie  30 millicurie Intravenous Once PRN Devin Alter [name], [name]        Allergies:   Morphine , Yellow jacket venom [bee venom], and Trazodone     Social History:   reports that he has quit smoking. He has never used smokeless tobacco. He reports that he does not currently use alcohol. He reports that he does not use drugs.   Family History:  family history includes Allergic rhinitis in his brother; Osteoarthritis in his mother; Other (age of onset: 86) in his father.    ROS:     Review of Systems  Constitutional: Negative.   HENT: Negative.    Eyes: Negative.   Respiratory: Negative.    Gastrointestinal: Negative.   Genitourinary: Negative.   Musculoskeletal: Negative.   Skin: Negative.   Neurological: Negative.   Endo/Heme/Allergies: Negative.   Psychiatric/Behavioral: Negative.    All other systems reviewed and are negative.     All other systems are reviewed and negative.    PHYSICAL EXAM: VS:  BP (!) 104/58   Pulse 69   Ht 6' (1.829 m)   Wt 203 lb 3.2 oz (92.2 kg)   SpO2 95%   BMI 27.56 kg/m  , BMI Body mass index is 27.56 kg/m. Last weight:  Wt Readings from Last 3 Encounters:  01/28/24 203 lb 3.2 oz (92.2 kg)  12/30/23 209 lb 3.2 oz (94.9 kg)  12/20/23 209 lb 9.6 oz (95.1 kg)     Physical Exam Vitals reviewed.  Constitutional:       Appearance: Normal appearance. He is normal weight.  HENT:     Head: Normocephalic.     Nose: Nose normal.     Mouth/Throat:     Mouth: Mucous membranes are moist.  Eyes:     Pupils: Pupils are equal, round, and reactive to light.  Cardiovascular:     Rate and Rhythm: Normal rate and regular rhythm.     Pulses:  Normal pulses.     Heart sounds: Normal heart sounds.  Pulmonary:     Effort: Pulmonary effort is normal.  Abdominal:     General: Abdomen is flat. Bowel sounds are normal.  Musculoskeletal:        General: Normal range of motion.     Cervical back: Normal range of motion.  Skin:    General: Skin is warm.  Neurological:     General: No focal deficit present.     Mental Status: He is alert.  Psychiatric:        Mood and Affect: Mood normal.       EKG:   Recent Labs: 12/11/2023: NT-Pro BNP 638 12/20/2023: ALT 28; BUN 28; Creatinine, Ser 1.17; Potassium 4.2; Sodium 137    Lipid Panel    Component Value Date/Time   CHOL 105 10/13/2022 0313   TRIG 55 10/13/2022 0313   HDL 59 10/13/2022 0313   CHOLHDL 1.8 10/13/2022 0313   VLDL 11 10/13/2022 0313   LDLCALC 35 10/13/2022 0313      Other studies Reviewed: Additional studies/ records that were reviewed today include:  Review of the above records demonstrates:       No data to display            ASSESSMENT AND PLAN:    ICD-10-CM   1. SOB (shortness of breath)  R06.02    Normal LVEF, on echo, doing well.    2. Coronary artery disease involving native coronary artery of native heart without angina pectoris  I25.10     3. Other chest pain  R07.89    No chest pain or SOB.    4. HFrEF (heart failure with reduced ejection fraction) (HCC)  I50.20     5. Coronary artery disease involving coronary bypass graft with unstable angina pectoris, unspecified whether native or transplanted heart (HCC)  I25.700    stress test no ischaemia    6. Atrial fibrillation status post cardioversion (HCC)  I48.91      7. PAD (peripheral artery disease)  I73.9        Problem List Items Addressed This Visit       Cardiovascular and Mediastinum   CAD (coronary artery disease)   Atrial fibrillation status post cardioversion [facility])   PAD (peripheral artery disease)   HFrEF (heart failure with reduced ejection fraction) (HCC)     Other   Chest pain   Other Visit Diagnoses       SOB (shortness of breath)    -  Primary   Normal LVEF, on echo, doing well.          Disposition:   Return in about 3 months (around 04/29/2024).    Total time spent: 45 minutes  Signed,  Devin Bathe, [name]  01/28/2024 2:19 PM    [facility]

## 2024-02-22 ENCOUNTER — Other Ambulatory Visit: Payer: Self-pay | Admitting: Cardiovascular Disease

## 2024-02-22 DIAGNOSIS — I4891 Unspecified atrial fibrillation: Secondary | ICD-10-CM

## 2024-02-22 DIAGNOSIS — I5020 Unspecified systolic (congestive) heart failure: Secondary | ICD-10-CM

## 2024-02-22 DIAGNOSIS — I2510 Atherosclerotic heart disease of native coronary artery without angina pectoris: Secondary | ICD-10-CM

## 2024-02-22 DIAGNOSIS — I25700 Atherosclerosis of coronary artery bypass graft(s), unspecified, with unstable angina pectoris: Secondary | ICD-10-CM

## 2024-02-22 DIAGNOSIS — I739 Peripheral vascular disease, unspecified: Secondary | ICD-10-CM

## 2024-02-25 ENCOUNTER — Ambulatory Visit: Admitting: Cardiovascular Disease

## 2024-03-10 ENCOUNTER — Other Ambulatory Visit: Payer: Self-pay | Admitting: Cardiovascular Disease

## 2024-03-10 DIAGNOSIS — I2510 Atherosclerotic heart disease of native coronary artery without angina pectoris: Secondary | ICD-10-CM

## 2024-04-03 ENCOUNTER — Other Ambulatory Visit: Payer: Self-pay | Admitting: Cardiovascular Disease

## 2024-04-03 DIAGNOSIS — R0602 Shortness of breath: Secondary | ICD-10-CM

## 2024-04-03 DIAGNOSIS — I4891 Unspecified atrial fibrillation: Secondary | ICD-10-CM

## 2024-04-03 DIAGNOSIS — R0789 Other chest pain: Secondary | ICD-10-CM

## 2024-04-03 DIAGNOSIS — I2510 Atherosclerotic heart disease of native coronary artery without angina pectoris: Secondary | ICD-10-CM

## 2024-04-03 DIAGNOSIS — I5020 Unspecified systolic (congestive) heart failure: Secondary | ICD-10-CM

## 2024-04-03 DIAGNOSIS — I25700 Atherosclerosis of coronary artery bypass graft(s), unspecified, with unstable angina pectoris: Secondary | ICD-10-CM

## 2024-04-09 ENCOUNTER — Encounter: Admitting: Physician Assistant

## 2024-04-30 ENCOUNTER — Ambulatory Visit: Admitting: Cardiovascular Disease
